# Patient Record
Sex: Male | Born: 1960 | Race: White | Hispanic: No | Marital: Married | Smoking: Never smoker
Health system: Southern US, Community
[De-identification: ages and names within clinical notes are randomized; demographics above are authoritative.]

## PROBLEM LIST (undated history)

## (undated) DIAGNOSIS — I509 Heart failure, unspecified: Secondary | ICD-10-CM

## (undated) DIAGNOSIS — I5189 Other ill-defined heart diseases: Secondary | ICD-10-CM

## (undated) DIAGNOSIS — M1990 Unspecified osteoarthritis, unspecified site: Secondary | ICD-10-CM

## (undated) DIAGNOSIS — I10 Essential (primary) hypertension: Secondary | ICD-10-CM

## (undated) DIAGNOSIS — I428 Other cardiomyopathies: Secondary | ICD-10-CM

## (undated) DIAGNOSIS — I639 Cerebral infarction, unspecified: Secondary | ICD-10-CM

## (undated) HISTORY — DX: Cerebral infarction, unspecified: I63.9

---

## 2013-11-02 ENCOUNTER — Emergency Department (HOSPITAL_COMMUNITY): Payer: BC Managed Care – PPO

## 2013-11-02 ENCOUNTER — Inpatient Hospital Stay (HOSPITAL_COMMUNITY)
Admission: EM | Admit: 2013-11-02 | Discharge: 2013-11-06 | DRG: 287 | Disposition: A | Discharge status: Home / Self Care | Payer: BC Managed Care – PPO | Attending: Cardiology | Admitting: Cardiology

## 2013-11-02 ENCOUNTER — Encounter (HOSPITAL_COMMUNITY): Payer: Self-pay | Admitting: Emergency Medicine

## 2013-11-02 DIAGNOSIS — I43 Cardiomyopathy in diseases classified elsewhere: Secondary | ICD-10-CM | POA: Diagnosis present

## 2013-11-02 DIAGNOSIS — I5031 Acute diastolic (congestive) heart failure: Secondary | ICD-10-CM | POA: Diagnosis present

## 2013-11-02 DIAGNOSIS — I110 Hypertensive heart disease with heart failure: Principal | ICD-10-CM | POA: Diagnosis present

## 2013-11-02 DIAGNOSIS — I160 Hypertensive urgency: Secondary | ICD-10-CM

## 2013-11-02 DIAGNOSIS — I420 Dilated cardiomyopathy: Secondary | ICD-10-CM

## 2013-11-02 DIAGNOSIS — I2510 Atherosclerotic heart disease of native coronary artery without angina pectoris: Secondary | ICD-10-CM | POA: Diagnosis present

## 2013-11-02 DIAGNOSIS — I472 Ventricular tachycardia, unspecified: Secondary | ICD-10-CM | POA: Diagnosis not present

## 2013-11-02 DIAGNOSIS — I2789 Other specified pulmonary heart diseases: Secondary | ICD-10-CM | POA: Diagnosis present

## 2013-11-02 DIAGNOSIS — Z8249 Family history of ischemic heart disease and other diseases of the circulatory system: Secondary | ICD-10-CM

## 2013-11-02 DIAGNOSIS — Z79899 Other long term (current) drug therapy: Secondary | ICD-10-CM

## 2013-11-02 DIAGNOSIS — I5021 Acute systolic (congestive) heart failure: Secondary | ICD-10-CM

## 2013-11-02 DIAGNOSIS — I509 Heart failure, unspecified: Secondary | ICD-10-CM | POA: Diagnosis present

## 2013-11-02 DIAGNOSIS — I4729 Other ventricular tachycardia: Secondary | ICD-10-CM | POA: Diagnosis not present

## 2013-11-02 DIAGNOSIS — I5043 Acute on chronic combined systolic (congestive) and diastolic (congestive) heart failure: Secondary | ICD-10-CM | POA: Diagnosis present

## 2013-11-02 DIAGNOSIS — N289 Disorder of kidney and ureter, unspecified: Secondary | ICD-10-CM | POA: Diagnosis present

## 2013-11-02 DIAGNOSIS — I10 Essential (primary) hypertension: Secondary | ICD-10-CM | POA: Diagnosis present

## 2013-11-02 HISTORY — DX: Other ill-defined heart diseases: I51.89

## 2013-11-02 HISTORY — DX: Essential (primary) hypertension: I10

## 2013-11-02 LAB — CBC
HEMATOCRIT: 45.3 % (ref 39.0–52.0)
Hemoglobin: 16 g/dL (ref 13.0–17.0)
MCH: 32.2 pg (ref 26.0–34.0)
MCHC: 35.3 g/dL (ref 30.0–36.0)
MCV: 91.1 fL (ref 78.0–100.0)
PLATELETS: 194 10*3/uL (ref 150–400)
RBC: 4.97 MIL/uL (ref 4.22–5.81)
RDW: 13.4 % (ref 11.5–15.5)
WBC: 10.3 10*3/uL (ref 4.0–10.5)

## 2013-11-02 LAB — BASIC METABOLIC PANEL
BUN: 31 mg/dL — AB (ref 6–23)
CO2: 22 mEq/L (ref 19–32)
CREATININE: 1.2 mg/dL (ref 0.50–1.35)
Calcium: 9.8 mg/dL (ref 8.4–10.5)
Chloride: 98 mEq/L (ref 96–112)
GFR calc non Af Amer: 67 mL/min — ABNORMAL LOW (ref >90)
GFR, EST AFRICAN AMERICAN: 78 mL/min — AB (ref >90)
Glucose, Bld: 139 mg/dL — ABNORMAL HIGH (ref 70–99)
Potassium: 4 mEq/L (ref 3.7–5.3)
Sodium: 140 mEq/L (ref 137–147)

## 2013-11-02 LAB — I-STAT TROPONIN, ED: Troponin i, poc: 0.05 ng/mL (ref 0.00–0.08)

## 2013-11-02 LAB — TROPONIN I: Troponin I: <0.3 ng/mL (ref <0.30)

## 2013-11-02 LAB — PRO B NATRIURETIC PEPTIDE: PRO B NATRI PEPTIDE: 9042 pg/mL — AB (ref 0–125)

## 2013-11-02 MED ORDER — SODIUM CHLORIDE 0.9 % IV SOLN: 250.0000 mL | INTRAVENOUS | Status: DC | PRN

## 2013-11-02 MED ORDER — SODIUM CHLORIDE 0.9 % IJ SOLN
3.0000 mL | INTRAMUSCULAR | Status: DC | PRN
  Administered 2013-11-06: 3 mL via INTRAVENOUS

## 2013-11-02 MED ORDER — FUROSEMIDE 10 MG/ML IJ SOLN
20.0000 mg | Freq: Once | INTRAMUSCULAR | Status: AC
  Administered 2013-11-02: 20 mg via INTRAVENOUS
  Filled 2013-11-02: qty 2

## 2013-11-02 MED ORDER — SODIUM CHLORIDE 0.9 % IJ SOLN
3.0000 mL | Freq: Two times a day (BID) | INTRAMUSCULAR | Status: DC
  Administered 2013-11-05 – 2013-11-06 (×3): 3 mL via INTRAVENOUS

## 2013-11-02 MED ORDER — FUROSEMIDE 10 MG/ML IJ SOLN
40.0000 mg | Freq: Two times a day (BID) | INTRAMUSCULAR | Status: DC
  Administered 2013-11-03 – 2013-11-04 (×4): 40 mg via INTRAVENOUS
  Filled 2013-11-02 (×8): qty 4

## 2013-11-02 MED ORDER — LABETALOL HCL 5 MG/ML IV SOLN
5.0000 mg | Freq: Once | INTRAVENOUS | Status: AC
  Administered 2013-11-02: 5 mg via INTRAVENOUS
  Filled 2013-11-02: qty 4

## 2013-11-02 MED ORDER — METOPROLOL TARTRATE 25 MG PO TABS
25.0000 mg | ORAL_TABLET | Freq: Two times a day (BID) | ORAL | Status: DC
  Administered 2013-11-02: 25 mg via ORAL
  Filled 2013-11-02 (×3): qty 1

## 2013-11-02 MED ORDER — NITROGLYCERIN IN D5W 200-5 MCG/ML-% IV SOLN
2.0000 ug/min | INTRAVENOUS | Status: AC
  Administered 2013-11-02: 5 ug/min via INTRAVENOUS
  Administered 2013-11-03: 80 ug/min via INTRAVENOUS
  Administered 2013-11-03: 85 ug/min via INTRAVENOUS
  Filled 2013-11-02 (×5): qty 250

## 2013-11-02 MED ORDER — HEPARIN SODIUM (PORCINE) 5000 UNIT/ML IJ SOLN
5000.0000 [IU] | Freq: Three times a day (TID) | INTRAMUSCULAR | Status: DC
  Administered 2013-11-02 – 2013-11-05 (×8): 5000 [IU] via SUBCUTANEOUS
  Filled 2013-11-02 (×11): qty 1

## 2013-11-02 NOTE — ED Notes (Signed)
Cardiology at bedside.

## 2013-11-02 NOTE — ED Notes (Signed)
Sent from dr's office [place] for work up for CHF. Legs swollen for three weeks 2+ pitting edema to knees, states having difficulty breathing for past couple nights, no hx of hypertension. Dry cough.

## 2013-11-02 NOTE — ED Notes (Signed)
Pt still in Xray, not brought back to room at this time.

## 2013-11-02 NOTE — ED Provider Notes (Signed)
CSN: [id number]     Arrival date & time 11/02/13  1618 History   First MD Initiated Contact with Patient 11/02/13 1837     Chief Complaint  Patient presents with  . Hypertension  . Leg Swelling  . Shortness of Breath     (Consider location/radiation/quality/duration/timing/severity/associated sxs/prior Treatment) Patient is a 52 y.o. male presenting with shortness of breath.  Shortness of Breath Severity:  Moderate Onset quality:  Gradual Duration:  3 weeks Timing:  Constant Progression:  Worsening Chronicity:  New Context: not activity and not URI   Relieved by:  None tried Worsened by:  Nothing tried Associated symptoms: no abdominal pain, no chest pain, no cough, no fever, no headaches, no neck pain and no vomiting   Risk factors: alcohol use     History reviewed. No pertinent past medical history. History reviewed. No pertinent past surgical history. No family history on file. History  Substance Use Topics  . Smoking status: Never Smoker   . Smokeless tobacco: Not on file  . Alcohol Use: 5.4 oz/week    9 Cans of beer per week     Comment: 9 beers at least a night,     Review of Systems  Constitutional: Negative for fever and chills.  HENT: Negative for congestion and rhinorrhea.   Eyes: Negative for pain.  Respiratory: Positive for shortness of breath. Negative for cough.   Cardiovascular: Positive for leg swelling. Negative for chest pain and palpitations.  Gastrointestinal: Negative for vomiting, abdominal pain, diarrhea and constipation.  Endocrine: Negative for polydipsia and polyuria.  Genitourinary: Negative for dysuria and flank pain.  Musculoskeletal: Negative for back pain and neck pain.  Skin: Negative for color change and wound.  Neurological: Negative for dizziness, numbness and headaches.      Allergies  Review of patient's allergies indicates not on file.  Home Medications   Prior to Admission medications   Not on File   BP 229/153   Pulse 112  Temp(Src) 98.1 F (36.7 C) (Oral)  Resp 26  SpO2 95% Physical Exam  Nursing note and vitals reviewed. Constitutional: He is oriented to person, place, and time. He appears well-developed and well-nourished.  HENT:  Head: Normocephalic and atraumatic.  Eyes: Conjunctivae and EOM are normal. Pupils are equal, round, and reactive to light.  Neck: Normal range of motion. JVD present.  Cardiovascular: Regular rhythm.  Tachycardia present.   Pulmonary/Chest: Effort normal and breath sounds normal.  Abdominal: Soft. He exhibits no distension. There is no tenderness.  Musculoskeletal: Normal range of motion. He exhibits edema (2+ to the knees). He exhibits no tenderness.  Neurological: He is alert and oriented to person, place, and time.  Skin: Skin is warm and dry.    ED Course  Procedures (including critical care time) Labs Review Labs Reviewed  BASIC METABOLIC PANEL - Abnormal; Notable for the following:    Glucose, Bld 139 (*)    BUN 31 (*)    GFR calc non Af Amer 67 (*)    GFR calc Af Amer 78 (*)    All other components within normal limits  PRO B NATRIURETIC PEPTIDE - Abnormal; Notable for the following:    Pro B Natriuretic peptide (BNP) 9042.0 (*)    All other components within normal limits  CBC  I-STAT TROPOININ, ED    Imaging Review Dg Chest 2 View  11/02/2013   CLINICAL DATA:  HYPERTENSION LEG SWELLING SHORTNESS OF BREATH  EXAM: CHEST  2 VIEW  COMPARISON:  None.  FINDINGS: Moderate cardiomegaly is present. Mediastinal silhouette within normal limits.  Lungs are normally inflated. A small right pleural effusion is present. Associated right basilar opacities most likely reflect atelectasis. The left lung is clear. No pulmonary edema. No pneumothorax.  No acute osseous abnormality.  IMPRESSION: 1. Cardiomegaly without pulmonary edema. 2. Small right pleural effusion with associated right basilar atelectasis.   Electronically Signed   By: Jeannine Boga M.D.    On: 11/02/2013 18:28     EKG Interpretation   Date/Time:  Monday November 02 2013 17:25:11 EDT Ventricular Rate:  111 PR Interval:  152 QRS Duration: 100 QT Interval:  340 QTC Calculation: 462 R Axis:   -24 Text Interpretation:  Sinus tachycardia Possible Left atrial enlargement  Incomplete right bundle branch block Left ventricular hypertrophy  Nonspecific T wave abnormality Abnormal ECG Sinus tachycardia Left  ventricular hypertrophy Non-specific intra-ventricular conduction delay T  wave abnormality Abnormal ekg Confirmed by Carmin Muskrat  MD [phone]) on  11/02/2013 6:54:02 PM      MDM   Final diagnoses:  None    53 yo M who doesn't go to the doctor but likely long standing HTN here with htn urgency. Obvious s/s heart failure with JVD, LE edema, sob and orthopnea. Labs with elevated bnp and troponin, ecg with LVH, cxr small pleural effusions. Given labetalol and iv lasix,  with some response in BP, didn't want to drop >25% with concern for possible cerebral hypoperfusion. cardiology consulted for admission.     Merrily Pew, MD 11/03/13 2108

## 2013-11-02 NOTE — H&P (Signed)
Admit date: 11/02/2013 Referring Physician:  Dr. Dayna Barker Primary Cardiologist:  NONE Chief complaint/reason for admission:sob, LE edema3  HPI: This is a 53yo WM with a history of HTN (on no meds) who presented to the ER with complaints of SOB and LE edema for about 3 weeks.  The edema has occurred for 3 weeks and SOB for about 5 days.  The SOB occurred with exertion and with lying flat in bed.  He has had orthopnea and last night he had to sit propped up to sleep.  He says that he has not been able to get comfortable in bed due to SOB.  He denies any PND.  He denies any chest pain or pressure.  He denies any diaphoresis, nausea or vomiting.  He does not use table salt, although his wife occasionally cooks with sea salt.  In the ER his BP was 204/164mmHg.  He says that he has not seen a PCP since 2012.      PMH:    Past Medical History  Diagnosis Date  . Hypertension     PSH:   History reviewed. No pertinent past surgical history.  ALLERGIES:   Review of patient's allergies indicates no known allergies.  Prior to Admit Meds:   (Not in a hospital admission) Family HX:    Family History  Problem Relation Age of Onset  . Heart attack Father   . Heart disease Father   . Arrhythmia Father   . Hypertension Brother   . Hypertension Brother    Social HX:    History   Social History  . Marital Status: Single    Spouse Name: N/A    Number of Children: N/A  . Years of Education: N/A   Occupational History  . Not on file.   Social History Main Topics  . Smoking status: Never Smoker   . Smokeless tobacco: Not on file  . Alcohol Use: 5.4 oz/week    9 Cans of beer per week     Comment: 9 beers at least a night,   . Drug Use: No  . Sexual Activity: Not on file   Other Topics Concern  . Not on file   Social History Narrative  . No narrative on file     ROS:  All 11 ROS were addressed and are negative except what is stated in the HPI  PHYSICAL EXAM Filed Vitals:   11/02/13  1848  BP: 229/153  Pulse:   Temp:   Resp: 26   General: Well developed, well nourished, in no acute distress Head: Eyes PERRLA, No xanthomas.   Normal cephalic and atramatic  Lungs:   Crackles at bases bilaterally Heart:   HRRR S1 S2 Pulses are 2+ & equal.            No carotid bruit. No JVD.  No abdominal bruits. No femoral bruits. Abdomen: Bowel sounds are positive, abdomen soft and non-tender without masses  Extremities:   3+ edema bilaterally up to his knees bilaterally Neuro: Alert and oriented X 3. Psych:  Good affect, responds appropriately   Labs:   Lab Results  Component Value Date   WBC 10.3 11/02/2013   HGB 16.0 11/02/2013   HCT 45.3 11/02/2013   MCV 91.1 11/02/2013   PLT 194 11/02/2013    Recent Labs Lab 11/02/13 1740  NA 140  K 4.0  CL 98  CO2 22  BUN 31*  CREATININE 1.20  CALCIUM 9.8  GLUCOSE 139*  Radiology:  No results found.  EKG:  NSR with LAE and LVH with repolarization abnormality  ASSESSMENT:  1.  Hypertensive urgency 2.  Acute CHF most likely diastolic CHF related to #1 but could have devloped a hypertensive DCM with acute systolic CHF if BP has been elevated for some time.  He has evidence of LVH on EKG.   PLAN:   1.  Admit to stepdown bed - tele 2.  IV NTG gtt and titrated to reduce SBP<141mmHg 3.  IV Lasix 40mg  IV BID and follow renal function closely 4.  Lopressor 25mg  BID and titrated as needed for BP control 5.  Cycle cardiac enzymes 6.  2D echo in am to assess LVF   Sueanne Margarita, MD  11/02/2013  7:40 PM

## 2013-11-03 DIAGNOSIS — I369 Nonrheumatic tricuspid valve disorder, unspecified: Secondary | ICD-10-CM

## 2013-11-03 LAB — HEMOGLOBIN A1C
Hgb A1c MFr Bld: 6 % — ABNORMAL HIGH (ref <5.7)
Mean Plasma Glucose: 126 mg/dL — ABNORMAL HIGH (ref <117)

## 2013-11-03 LAB — BASIC METABOLIC PANEL
BUN: 31 mg/dL — AB (ref 6–23)
CO2: 28 mEq/L (ref 19–32)
CREATININE: 1.31 mg/dL (ref 0.50–1.35)
Calcium: 9.4 mg/dL (ref 8.4–10.5)
Chloride: 100 mEq/L (ref 96–112)
GFR calc Af Amer: 70 mL/min — ABNORMAL LOW (ref >90)
GFR, EST NON AFRICAN AMERICAN: 61 mL/min — AB (ref >90)
Glucose, Bld: 169 mg/dL — ABNORMAL HIGH (ref 70–99)
Potassium: 3.7 mEq/L (ref 3.7–5.3)
Sodium: 143 mEq/L (ref 137–147)

## 2013-11-03 LAB — TROPONIN I
Troponin I: <0.3 ng/mL (ref <0.30)
Troponin I: <0.3 ng/mL (ref <0.30)
Troponin I: <0.3 ng/mL (ref <0.30)

## 2013-11-03 LAB — TSH: TSH: 5.01 u[IU]/mL — ABNORMAL HIGH (ref 0.350–4.500)

## 2013-11-03 LAB — MRSA PCR SCREENING: MRSA by PCR: NEGATIVE

## 2013-11-03 MED ORDER — METOPROLOL TARTRATE 50 MG PO TABS
50.0000 mg | ORAL_TABLET | Freq: Two times a day (BID) | ORAL | Status: DC
  Administered 2013-11-03 (×2): 50 mg via ORAL
  Filled 2013-11-03 (×4): qty 1

## 2013-11-03 MED ORDER — ACETAMINOPHEN 325 MG PO TABS
650.0000 mg | ORAL_TABLET | ORAL | Status: DC | PRN
  Administered 2013-11-03 – 2013-11-04 (×4): 650 mg via ORAL
  Filled 2013-11-03 (×4): qty 2

## 2013-11-03 MED ORDER — LISINOPRIL 10 MG PO TABS
10.0000 mg | ORAL_TABLET | Freq: Every day | ORAL | Status: DC
  Administered 2013-11-03: 10 mg via ORAL
  Filled 2013-11-03 (×2): qty 1

## 2013-11-03 NOTE — Progress Notes (Signed)
Echocardiogram 2D Echocardiogram has been performed.  Jeremy Grant 11/03/2013, 10:08 AM

## 2013-11-03 NOTE — Progress Notes (Signed)
     SUBJECTIVE: No chest pain. SOB is improved.   BP 179/118  Pulse 77  Temp(Src) 97.4 F (36.3 C) (Oral)  Resp 16  Ht 6\' 1"  (1.854 m)  Wt 161 lb 14.4 oz (73.437 kg)  BMI 21.36 kg/m2  SpO2 94%  Intake/Output Summary (Last 24 hours) at 11/03/13 [id number] Last data filed at 11/03/13 0600  Gross per 24 hour  Intake  98.43 ml  Output   2070 ml  Net -1971.57 ml    PHYSICAL EXAM General: Well developed, well nourished, in no acute distress. Alert and oriented x 3.  Psych:  Good affect, responds appropriately Neck: No JVD. No masses noted.  Lungs: Clear bilaterally with no wheezes or rhonci noted.  Heart: RRR with no murmurs noted. Abdomen: Bowel sounds are present. Soft, non-tender.  Extremities: 2+ bilateral lower extremity edema.   LABS: Basic Metabolic Panel:  Recent Labs  11/02/13 1740 11/03/13 0332  NA 140 143  K 4.0 3.7  CL 98 100  CO2 22 28  GLUCOSE 139* 169*  BUN 31* 31*  CREATININE 1.20 1.31  CALCIUM 9.8 9.4   CBC:  Recent Labs  11/02/13 1740  WBC 10.3  HGB 16.0  HCT 45.3  MCV 91.1  PLT 194   Cardiac Enzymes:  Recent Labs  11/02/13 1916 11/02/13 2348 11/03/13 0332  TROPONINI <0.30 <0.30 <0.30    Current Meds: . furosemide  40 mg Intravenous BID  . heparin  5,000 Units Subcutaneous 3 times per day  . metoprolol tartrate  25 mg Oral BID  . sodium chloride  3 mL Intravenous Q12H    ASSESSMENT AND PLAN:  1. Hypertensive urgency: He is on a NTG drip and low dose beta blocker. BP still not well controlled. Will continue IV NTG today. Will increase metoprolol to 50 mg po BID and will add Lisinopril 10 mg daily.   2. Acute CHF: Likely diastolic given HTN. EKG with LVH changes. Echo pending today. He has diuresed 2 liters with IV Lasix. Will continue IV Lasix today and work on better BP control.    MCALHANY,CHRISTOPHER  6/23/20158:37 AM

## 2013-11-03 NOTE — Care Management Note (Signed)
    Page 1 of 1   11/03/2013     7:49:33 AM CARE MANAGEMENT NOTE 11/03/2013  Patient:  Jeremy Grant, Jeremy Grant   Account Number:  [id number]  Date Initiated:  11/03/2013  Documentation initiated by:  Elissa Hefty  Subjective/Objective Assessment:   adm w htn urgency     Action/Plan:   lives w wife, pcp dr Redmond Pulling elkins   Anticipated DC Date:     Anticipated DC Plan:           Choice offered to / List presented to:             Status of service:   Medicare Important Message given?   (If response is "NO", the following Medicare IM given date fields will be blank) Date Medicare IM given:   Date Additional Medicare IM given:    Discharge Disposition:    Per UR Regulation:  Reviewed for med. necessity/level of care/duration of stay  If discussed at [place] of Stay Meetings, dates discussed:    Comments:

## 2013-11-04 DIAGNOSIS — I428 Other cardiomyopathies: Secondary | ICD-10-CM

## 2013-11-04 DIAGNOSIS — I420 Dilated cardiomyopathy: Secondary | ICD-10-CM

## 2013-11-04 DIAGNOSIS — I509 Heart failure, unspecified: Secondary | ICD-10-CM

## 2013-11-04 DIAGNOSIS — I5021 Acute systolic (congestive) heart failure: Secondary | ICD-10-CM

## 2013-11-04 LAB — BASIC METABOLIC PANEL
BUN: 23 mg/dL (ref 6–23)
CHLORIDE: 99 meq/L (ref 96–112)
CO2: 30 mEq/L (ref 19–32)
Calcium: 8.7 mg/dL (ref 8.4–10.5)
Creatinine, Ser: 1.31 mg/dL (ref 0.50–1.35)
GFR calc Af Amer: 70 mL/min — ABNORMAL LOW (ref >90)
GFR calc non Af Amer: 61 mL/min — ABNORMAL LOW (ref >90)
GLUCOSE: 116 mg/dL — AB (ref 70–99)
POTASSIUM: 3.4 meq/L — AB (ref 3.7–5.3)
Sodium: 143 mEq/L (ref 137–147)

## 2013-11-04 MED ORDER — ASPIRIN 81 MG PO CHEW
81.0000 mg | CHEWABLE_TABLET | ORAL | Status: AC
  Administered 2013-11-05: 81 mg via ORAL
  Filled 2013-11-04: qty 1

## 2013-11-04 MED ORDER — POTASSIUM CHLORIDE CRYS ER 20 MEQ PO TBCR
40.0000 meq | EXTENDED_RELEASE_TABLET | Freq: Once | ORAL | Status: AC
  Administered 2013-11-04: 40 meq via ORAL
  Filled 2013-11-04: qty 2

## 2013-11-04 MED ORDER — ISOSORBIDE MONONITRATE ER 30 MG PO TB24
30.0000 mg | ORAL_TABLET | Freq: Two times a day (BID) | ORAL | Status: DC
  Administered 2013-11-04 – 2013-11-06 (×5): 30 mg via ORAL
  Filled 2013-11-04 (×6): qty 1

## 2013-11-04 MED ORDER — SODIUM CHLORIDE 0.9 % IV SOLN: 250.0000 mL | INTRAVENOUS | Status: DC | PRN

## 2013-11-04 MED ORDER — LISINOPRIL 20 MG PO TABS
20.0000 mg | ORAL_TABLET | Freq: Every day | ORAL | Status: DC
  Administered 2013-11-04 – 2013-11-06 (×3): 20 mg via ORAL
  Filled 2013-11-04 (×3): qty 1

## 2013-11-04 MED ORDER — SODIUM CHLORIDE 0.9 % IV SOLN
INTRAVENOUS | Status: DC
  Administered 2013-11-05: 05:00:00 via INTRAVENOUS

## 2013-11-04 MED ORDER — CARVEDILOL 12.5 MG PO TABS
12.5000 mg | ORAL_TABLET | Freq: Two times a day (BID) | ORAL | Status: DC
  Administered 2013-11-04 – 2013-11-05 (×3): 12.5 mg via ORAL
  Filled 2013-11-04 (×5): qty 1

## 2013-11-04 MED ORDER — SODIUM CHLORIDE 0.9 % IJ SOLN: 3.0000 mL | INTRAMUSCULAR | Status: DC | PRN

## 2013-11-04 MED ORDER — ISOSORBIDE MONONITRATE ER 30 MG PO TB24: 30.0000 mg | ORAL_TABLET | Freq: Every day | ORAL | Status: DC

## 2013-11-04 MED ORDER — ISOSORBIDE MONONITRATE 15 MG HALF TABLET
15.0000 mg | ORAL_TABLET | Freq: Every day | ORAL | Status: DC
  Filled 2013-11-04: qty 1

## 2013-11-04 MED ORDER — SODIUM CHLORIDE 0.9 % IJ SOLN
3.0000 mL | Freq: Two times a day (BID) | INTRAMUSCULAR | Status: DC
  Administered 2013-11-04 – 2013-11-05 (×3): 3 mL via INTRAVENOUS

## 2013-11-04 NOTE — Progress Notes (Addendum)
Spoke at length with patient and patient's wife about importance of taking medication and follow up appointments, as well as education about heart failure, symptom management, diet and lifestyle modifications.  Patient very receptive, wife distrustful of medical system.  Will continue attempts to encourage and educate patient. [place], Ardeth Sportsman, RN

## 2013-11-04 NOTE — Progress Notes (Addendum)
     SUBJECTIVE: Feeling much better. Breathing improved. No chest pain.   BP 180/114  Pulse 79  Temp(Src) 98.3 F (36.8 C) (Oral)  Resp 25  Ht 6\' 1"  (1.854 m)  Wt 153 lb 7 oz (69.6 kg)  BMI 20.25 kg/m2  SpO2 92%  Intake/Output Summary (Last 24 hours) at 11/04/13 0931 Last data filed at 11/04/13 0854  Gross per 24 hour  Intake 1485.08 ml  Output   5850 ml  Net -4364.92 ml    PHYSICAL EXAM General: Well developed, well nourished, in no acute distress. Alert and oriented x 3.  Psych:  Good affect, responds appropriately Neck: + JVD. No masses noted.  Lungs: Clear bilaterally minimal basilar crackles.   Heart: RRR with no murmurs noted. Abdomen: Bowel sounds are present. Soft, non-tender.  Extremities: 1+ bilateral lower extremity edema.   LABS: Basic Metabolic Panel:  Recent Labs  11/03/13 0332 11/04/13 0245  NA 143 143  K 3.7 3.4*  CL 100 99  CO2 28 30  GLUCOSE 169* 116*  BUN 31* 23  CREATININE 1.31 1.31  CALCIUM 9.4 8.7   CBC:  Recent Labs  11/02/13 1740  WBC 10.3  HGB 16.0  HCT 45.3  MCV 91.1  PLT 194   Cardiac Enzymes:  Recent Labs  11/02/13 2348 11/03/13 0332 11/03/13 1125  TROPONINI <0.30 <0.30 <0.30    Current Meds: . furosemide  40 mg Intravenous BID  . heparin  5,000 Units Subcutaneous 3 times per day  . lisinopril  10 mg Oral Daily  . metoprolol tartrate  50 mg Oral BID  . sodium chloride  3 mL Intravenous Q12H   ASSESSMENT AND PLAN:  1. Hypertensive urgency: BP minimally improved overnight. Will titrate meds. Will increase Lisinopril to 20 mg daily.  With new systolic dysfunction noted on echo, will change to Coreg 12.5 mg po BID. Will stop NTG drip and add Imdur 30 mg daily.    2. Cardiomyopathy: Likely due to longstanding HTN. LVEF=15% by echo. I have had a long discussion with the patient and his wife in regards to the likely etiology of his cardiomyopathy. Will arrange right and left heart cath tomorrow to exclude CAD and  assess pressures. Will continue medical therapy with beta blocker, ace-inh, nitrate. His wife has many beliefs in the dangers of modern medicine and questions the need for medications long term. I have stated the importance of compliance with medical therapy. His long term follow up will be with Dr. Fransico Him.   3. Acute systolic CHF: Volume status improving. Still with mild volume overload. Will continue IV Lasix today. Likely switch to po Lasix tomorrow.   Transfer to telemetry unit. Cath tomorrow.      MCALHANY,CHRISTOPHER  6/24/20159:31 AM

## 2013-11-04 NOTE — Progress Notes (Deleted)
Md informed about  Persistent hypotension (87/49) with a map of 54 and Hr of 90. Pt is chest pain free and alert and oriented. Will keep monitoring.

## 2013-11-04 NOTE — Clinical Documentation Improvement (Signed)
Progress notes with document HTN urgency.  Please clarify if possible clinical conditions.   Accelerated Hypertension Malignant Hypertension Or Other Condition__________ Cannot Clinically Determine   Supporting Information: Risk Factors: Long standing HTN,  Alcohol use, heart failure, No medical folloq up since 2012  Signs and Symptoms: SOB, 3+ LE edema, JVD, Tachycardia, Labs with elevated bnp and troponin, ecg with LVH, cxr small pleural effusions  BP range:  Per ED note: BP 229/153  Pulse 112                                           204/180mmHg                On 11/03/13  BP 179/118  Diagnostics: [id number] Interpretation: Sinus tachycardia Possible Left atrial enlargement  Incomplete right bundle branch block Left ventricular hypertrophy  Nonspecific T wave abnormality Abnormal ECG Sinus tachycardia Left  ventricular hypertrophy Non-specific intra-ventricular conduction delay T  wave abnormality Abnormal ekg   Treatment: In ED>labetalol and IV lasix Admit to stepdown IV NTG gtt and titrated to reduce SBP<14mmHg   IV Lasix 40mg  IV BID and follow renal function closely   Lopressor 25mg  BID and titrated as needed for BP control   Cycle cardiac enzymes 11/03/13  PLAN:Will increase metoprolol to 50 mg po BID and will add Lisinopril 10 mg daily.     Thank You, Philippa Chester ,RN Clinical Documentation Specialist:  [place] [employer]

## 2013-11-05 ENCOUNTER — Encounter (HOSPITAL_COMMUNITY)
Admission: EM | Disposition: A | Discharge status: Home / Self Care | Payer: Self-pay | Source: Home / Self Care | Attending: Cardiology

## 2013-11-05 DIAGNOSIS — I472 Ventricular tachycardia: Secondary | ICD-10-CM

## 2013-11-05 DIAGNOSIS — N189 Chronic kidney disease, unspecified: Secondary | ICD-10-CM

## 2013-11-05 DIAGNOSIS — I2510 Atherosclerotic heart disease of native coronary artery without angina pectoris: Secondary | ICD-10-CM

## 2013-11-05 DIAGNOSIS — I509 Heart failure, unspecified: Secondary | ICD-10-CM

## 2013-11-05 DIAGNOSIS — I4729 Other ventricular tachycardia: Secondary | ICD-10-CM

## 2013-11-05 HISTORY — PX: LEFT AND RIGHT HEART CATHETERIZATION WITH CORONARY ANGIOGRAM: SHX5449

## 2013-11-05 LAB — BASIC METABOLIC PANEL
BUN: 30 mg/dL — AB (ref 6–23)
CO2: 33 mEq/L — ABNORMAL HIGH (ref 19–32)
Calcium: 8.9 mg/dL (ref 8.4–10.5)
Chloride: 98 mEq/L (ref 96–112)
Creatinine, Ser: 1.46 mg/dL — ABNORMAL HIGH (ref 0.50–1.35)
GFR calc Af Amer: 62 mL/min — ABNORMAL LOW (ref >90)
GFR, EST NON AFRICAN AMERICAN: 53 mL/min — AB (ref >90)
GLUCOSE: 116 mg/dL — AB (ref 70–99)
Potassium: 3.9 mEq/L (ref 3.7–5.3)
Sodium: 142 mEq/L (ref 137–147)

## 2013-11-05 LAB — CBC
HCT: 41.7 % (ref 39.0–52.0)
HEMATOCRIT: 40.7 % (ref 39.0–52.0)
Hemoglobin: 13.7 g/dL (ref 13.0–17.0)
Hemoglobin: 14.3 g/dL (ref 13.0–17.0)
MCH: 30.9 pg (ref 26.0–34.0)
MCH: 31.4 pg (ref 26.0–34.0)
MCHC: 33.7 g/dL (ref 30.0–36.0)
MCHC: 34.3 g/dL (ref 30.0–36.0)
MCV: 91.7 fL (ref 78.0–100.0)
MCV: 91.6 fL (ref 78.0–100.0)
Platelets: 152 10*3/uL (ref 150–400)
Platelets: 147 10*3/uL — ABNORMAL LOW (ref 150–400)
RBC: 4.44 MIL/uL (ref 4.22–5.81)
RBC: 4.55 MIL/uL (ref 4.22–5.81)
RDW: 13.1 % (ref 11.5–15.5)
RDW: 13 % (ref 11.5–15.5)
WBC: 6 10*3/uL (ref 4.0–10.5)
WBC: 5.6 10*3/uL (ref 4.0–10.5)

## 2013-11-05 LAB — POCT I-STAT 3, ART BLOOD GAS (G3+)
ACID-BASE EXCESS: 6 mmol/L — AB (ref 0.0–2.0)
Acid-Base Excess: 5 mmol/L — ABNORMAL HIGH (ref 0.0–2.0)
Bicarbonate: 30.1 mEq/L — ABNORMAL HIGH (ref 20.0–24.0)
Bicarbonate: 30.5 mEq/L — ABNORMAL HIGH (ref 20.0–24.0)
O2 SAT: 67 %
O2 Saturation: 91 %
PH ART: 7.427 (ref 7.350–7.450)
TCO2: 32 mmol/L (ref 0–100)
TCO2: 31 mmol/L (ref 0–100)
pCO2 arterial: 42 mmHg (ref 35.0–45.0)
pCO2 arterial: 46.3 mmHg — ABNORMAL HIGH (ref 35.0–45.0)
pH, Arterial: 7.463 — ABNORMAL HIGH (ref 7.350–7.450)
pO2, Arterial: 34 mmHg — CL (ref 80.0–100.0)
pO2, Arterial: 58 mmHg — ABNORMAL LOW (ref 80.0–100.0)

## 2013-11-05 LAB — CREATININE, SERUM
Creatinine, Ser: 1.15 mg/dL (ref 0.50–1.35)
GFR calc Af Amer: 82 mL/min — ABNORMAL LOW (ref >90)
GFR, EST NON AFRICAN AMERICAN: 71 mL/min — AB (ref >90)

## 2013-11-05 LAB — PROTIME-INR
INR: 1.12 (ref 0.00–1.49)
PROTHROMBIN TIME: 14.4 s (ref 11.6–15.2)

## 2013-11-05 SURGERY — LEFT AND RIGHT HEART CATHETERIZATION WITH CORONARY ANGIOGRAM
Anesthesia: LOCAL

## 2013-11-05 MED ORDER — HEPARIN SODIUM (PORCINE) 5000 UNIT/ML IJ SOLN
5000.0000 [IU] | Freq: Three times a day (TID) | INTRAMUSCULAR | Status: DC
  Administered 2013-11-05 – 2013-11-06 (×2): 5000 [IU] via SUBCUTANEOUS
  Filled 2013-11-05 (×3): qty 1

## 2013-11-05 MED ORDER — FENTANYL CITRATE 0.05 MG/ML IJ SOLN
INTRAMUSCULAR | Status: AC
  Filled 2013-11-05: qty 2

## 2013-11-05 MED ORDER — MIDAZOLAM HCL 2 MG/2ML IJ SOLN
INTRAMUSCULAR | Status: AC
  Filled 2013-11-05: qty 2

## 2013-11-05 MED ORDER — HEPARIN SODIUM (PORCINE) 1000 UNIT/ML IJ SOLN
INTRAMUSCULAR | Status: AC
  Filled 2013-11-05: qty 1

## 2013-11-05 MED ORDER — SODIUM CHLORIDE 0.9 % IV SOLN: 1.0000 mL/kg/h | INTRAVENOUS | Status: AC

## 2013-11-05 MED ORDER — HEPARIN (PORCINE) IN NACL 2-0.9 UNIT/ML-% IJ SOLN
INTRAMUSCULAR | Status: AC
  Filled 2013-11-05: qty 1000

## 2013-11-05 MED ORDER — VERAPAMIL HCL 2.5 MG/ML IV SOLN
INTRAVENOUS | Status: AC
Start: 2013-11-05 — End: 2013-11-05
  Filled 2013-11-05: qty 2

## 2013-11-05 MED ORDER — ONDANSETRON HCL 4 MG/2ML IJ SOLN: 4.0000 mg | Freq: Four times a day (QID) | INTRAMUSCULAR | Status: DC | PRN

## 2013-11-05 MED ORDER — HYDRALAZINE HCL 25 MG PO TABS
25.0000 mg | ORAL_TABLET | Freq: Three times a day (TID) | ORAL | Status: DC
  Administered 2013-11-05 – 2013-11-06 (×4): 25 mg via ORAL
  Filled 2013-11-05 (×6): qty 1

## 2013-11-05 MED ORDER — CARVEDILOL 25 MG PO TABS
25.0000 mg | ORAL_TABLET | Freq: Two times a day (BID) | ORAL | Status: DC
  Administered 2013-11-05 – 2013-11-06 (×2): 25 mg via ORAL
  Filled 2013-11-05 (×4): qty 1

## 2013-11-05 MED ORDER — LIDOCAINE HCL (PF) 1 % IJ SOLN
INTRAMUSCULAR | Status: AC
  Filled 2013-11-05: qty 30

## 2013-11-05 MED ORDER — NITROGLYCERIN 0.2 MG/ML ON CALL CATH LAB
INTRAVENOUS | Status: AC
  Filled 2013-11-05: qty 1

## 2013-11-05 MED ORDER — FUROSEMIDE 40 MG PO TABS
40.0000 mg | ORAL_TABLET | Freq: Every day | ORAL | Status: DC
  Administered 2013-11-05 – 2013-11-06 (×2): 40 mg via ORAL
  Filled 2013-11-05 (×2): qty 1

## 2013-11-05 NOTE — H&P (View-Only) (Signed)
     SUBJECTIVE: Feeling much better. Breathing improved. No chest pain.   BP 180/114  Pulse 79  Temp(Src) 98.3 F (36.8 C) (Oral)  Resp 25  Ht 6\' 1"  (1.854 m)  Wt 153 lb 7 oz (69.6 kg)  BMI 20.25 kg/m2  SpO2 92%  Intake/Output Summary (Last 24 hours) at 11/04/13 0931 Last data filed at 11/04/13 0854  Gross per 24 hour  Intake 1485.08 ml  Output   5850 ml  Net -4364.92 ml    PHYSICAL EXAM General: Well developed, well nourished, in no acute distress. Alert and oriented x 3.  Psych:  Good affect, responds appropriately Neck: + JVD. No masses noted.  Lungs: Clear bilaterally minimal basilar crackles.   Heart: RRR with no murmurs noted. Abdomen: Bowel sounds are present. Soft, non-tender.  Extremities: 1+ bilateral lower extremity edema.   LABS: Basic Metabolic Panel:  Recent Labs  11/03/13 0332 11/04/13 0245  NA 143 143  K 3.7 3.4*  CL 100 99  CO2 28 30  GLUCOSE 169* 116*  BUN 31* 23  CREATININE 1.31 1.31  CALCIUM 9.4 8.7   CBC:  Recent Labs  11/02/13 1740  WBC 10.3  HGB 16.0  HCT 45.3  MCV 91.1  PLT 194   Cardiac Enzymes:  Recent Labs  11/02/13 2348 11/03/13 0332 11/03/13 1125  TROPONINI <0.30 <0.30 <0.30    Current Meds: . furosemide  40 mg Intravenous BID  . heparin  5,000 Units Subcutaneous 3 times per day  . lisinopril  10 mg Oral Daily  . metoprolol tartrate  50 mg Oral BID  . sodium chloride  3 mL Intravenous Q12H   ASSESSMENT AND PLAN:  1. Hypertensive urgency: BP minimally improved overnight. Will titrate meds. Will increase Lisinopril to 20 mg daily.  With new systolic dysfunction noted on echo, will change to Coreg 12.5 mg po BID. Will stop NTG drip and add Imdur 30 mg daily.    2. Cardiomyopathy: Likely due to longstanding HTN. LVEF=15% by echo. I have had a long discussion with the patient and his wife in regards to the likely etiology of his cardiomyopathy. Will arrange right and left heart cath tomorrow to exclude CAD and  assess pressures. Will continue medical therapy with beta blocker, ace-inh, nitrate. His wife has many beliefs in the dangers of modern medicine and questions the need for medications long term. I have stated the importance of compliance with medical therapy. His long term follow up will be with Dr. Fransico Him.   3. Acute systolic CHF: Volume status improving. Still with mild volume overload. Will continue IV Lasix today. Likely switch to po Lasix tomorrow.   Transfer to telemetry unit. Cath tomorrow.      MCALHANY,CHRISTOPHER  6/24/20159:31 AM

## 2013-11-05 NOTE — ED Provider Notes (Signed)
This patient was seen in conjunction with the resident physician, Dr. Dayna Barker.  The documentation accurately reflects the patient's ED evaluation.  On my exam, the patient was awake and alert. With concern for new HF, and his notable HTN, the patient required admission for further E/M.  I have seen the ECG, 111, st, lvh, abnormal   Jeremy Muskrat, MD 11/05/13 1120

## 2013-11-05 NOTE — CV Procedure (Signed)
    Cardiac Catheterization Procedure Note  Name: Jeremy Grant MRN: [id number] DOB: Aug 13, 1960  Procedure: Right Heart Cath, Left Heart Cath, Selective Coronary Angiography  Indication: 53 yo WM with new onset CHF with EF 20% by Echo.    Procedural Details: The right wrist was prepped, draped, and anesthetized with 1% lidocaine. Using the modified Seldinger technique a 6 Fr slender sheath was placed in the right radial artery and a 5 French sheath was placed in the right brachial vein. A Swan-Ganz catheter was used for the right heart catheterization. Standard protocol was followed for recording of right heart pressures and sampling of oxygen saturations. Fick cardiac output was calculated. Standard Judkins catheters were used for selective coronary angiography and left ventricular pressure measurement. There were no immediate procedural complications. The patient was transferred to the post catheterization recovery area for further monitoring.  Procedural Findings: Hemodynamics RA 8/5 mean 3 mm Hg RV 52/5 mm Hg PA 49/19 mean 31 mm Hg PCWP 13/11 mean 10 mm Hg LV 165/15 mm Hg AO 166/93 mean 125 mm Hg  Oxygen saturations: PA 67% AO 91%  Cardiac Output (Fick) 5.71 L/min  Cardiac Index (Fick) 2.97 L/min/meter squared   Coronary angiography: Coronary dominance: right  Left mainstem: Normal  Left anterior descending (LAD): Large vessel. The LAD is without significant disease. The first diagonal has 20% disease at the ostium.  Left circumflex (LCx): Normal.  Right coronary artery (RCA): 30% stenosis at the bifurcation of the PDA and PLOM.   Left ventriculography: not done  Final Conclusions:   1. Minimal nonobstructive CAD 2. 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 Normal LV filling pressures  Recommendations: Medical management.   Peter Martinique, [place] 11/05/2013, 11:03 AM

## 2013-11-05 NOTE — Progress Notes (Signed)
Patient Name: Jeremy Grant Date of Encounter: 11/05/2013     Active Problems:   Acute diastolic CHF (congestive heart failure), NYHA class 4   CHF (congestive heart failure)   Acute systolic CHF (congestive heart failure), NYHA class 4   Congestive dilated cardiomyopathy    SUBJECTIVE  Denies any chest pain or SOB. No dizziness overnight. Slept well.   CURRENT MEDS . carvedilol  12.5 mg Oral BID WC  . furosemide  40 mg Intravenous BID  . heparin  5,000 Units Subcutaneous 3 times per day  . isosorbide mononitrate  30 mg Oral Q12H  . lisinopril  20 mg Oral Daily  . sodium chloride  3 mL Intravenous Q12H  . sodium chloride  3 mL Intravenous Q12H    OBJECTIVE  Filed Vitals:   11/04/13 1915 11/04/13 2146 11/05/13 0158 11/05/13 0623  BP: 138/87 157/103 152/102 151/98  Pulse: 75 81 85 85  Temp: 97.7 F (36.5 C) 97.7 F (36.5 C) 98.5 F (36.9 C) 98.5 F (36.9 C)  TempSrc: Oral Oral Oral Oral  Resp: 20 18 18 18   Height:      Weight:    148 lb 12.8 oz (67.495 kg)  SpO2: 95% 97% 98% 98%    Intake/Output Summary (Last 24 hours) at 11/05/13 0812 Last data filed at 11/05/13 0737  Gross per 24 hour  Intake  787.8 ml  Output  13975 ml  Net -13187.2 ml   Filed Weights   11/04/13 0400 11/04/13 1331 11/05/13 0623  Weight: 153 lb 7 oz (69.6 kg) 151 lb 14.4 oz (68.901 kg) 148 lb 12.8 oz (67.495 kg)    PHYSICAL EXAM  General: Pleasant, NAD. Neuro: Alert and oriented X 3. Moves all extremities spontaneously. Psych: Normal affect. HEENT:  Normal  Neck: Supple without bruits or JVD. Lungs:  Resp regular and unlabored, CTA. No significant rale, wheezing or ronchi. Heart: RRR no s3, s4, or murmurs. Abdomen: Soft, non-tender, non-distended, BS + x 4.  Extremities: No clubbing, cyanosis or edema. DP/PT/Radials 2+ and equal bilaterally.  Accessory Clinical Findings  CBC  Recent Labs  11/02/13 1740 11/05/13 0314  WBC 10.3 6.0  HGB 16.0 13.7  HCT 45.3 40.7  MCV  91.1 91.7  PLT 194 0000000   Basic Metabolic Panel  Recent Labs  11/04/13 0245 11/05/13 0314  NA 143 142  K 3.4* 3.9  CL 99 98  CO2 30 33*  GLUCOSE 116* 116*  BUN 23 30*  CREATININE 1.31 1.46*  CALCIUM 8.7 8.9   Liver Function Tests No results found for this basename: AST, ALT, ALKPHOS, BILITOT, PROT, ALBUMIN,  in the last 72 hours No results found for this basename: LIPASE, AMYLASE,  in the last 72 hours Cardiac Enzymes  Recent Labs  11/02/13 2348 11/03/13 0332 11/03/13 1125  TROPONINI <0.30 <0.30 <0.30   BNP No components found with this basename: POCBNP,  D-Dimer No results found for this basename: DDIMER,  in the last 72 hours Hemoglobin A1C  Recent Labs  11/02/13 1916  HGBA1C 6.0*   Fasting Lipid Panel No results found for this basename: CHOL, HDL, LDLCALC, TRIG, CHOLHDL, LDLDIRECT,  in the last 72 hours Thyroid Function Tests  Recent Labs  11/02/13 2348  TSH 5.010*    TELE  NSR with HR 70-80s, 1 episode of NSVT at 3 am this morning, 8 beats run  ECG  Sinus tachycardia with HR 110s  Radiology/Studies  Dg Chest 2 View  11/02/2013   CLINICAL DATA:  HYPERTENSION LEG SWELLING SHORTNESS OF BREATH  EXAM: CHEST  2 VIEW  COMPARISON:  None.  FINDINGS: Moderate cardiomegaly is present. Mediastinal silhouette within normal limits.  Lungs are normally inflated. A small right pleural effusion is present. Associated right basilar opacities most likely reflect atelectasis. The left lung is clear. No pulmonary edema. No pneumothorax.  No acute osseous abnormality.  IMPRESSION: 1. Cardiomegaly without pulmonary edema. 2. Small right pleural effusion with associated right basilar atelectasis.   Electronically Signed   By: Jeannine Boga M.D.   On: 11/02/2013 18:28    ASSESSMENT AND PLAN  1. Malignant Hypertension 2/2 lack of medication and medical follow up since 2012  - continue imdur, lisinopril, increase carvedilol to 25mg  BID  2. Chronic combined  systolic and diastolic dysfunction  - 2/2 long standing HTN  - Echo 11/03/2013 EF 20% , mild LVH, global hypokinesis, grade 3 diastolic dysfunction, RV systolic pressure [id number], trivial pericardial effusion  - plan for L and R heart cath today to exclude CAD  3. Acute systolic CHF: -[id number] since admission, ? Accuracy. -5lb since admission  - diuresed well, no rale on exam, LE edema resolved  - Hold IV lasix as Cr trending up 1.31 --> 1.46, possibly transition to PO lasix after RHC  - avoid over-diuresis with ACEI  - plan to adjust lasix dose before discharge, possibly tomorrow  4. Renal insufficiency of unkown duration, likely chronic  - due to longstanding HTN  - limit contrast dye during procedure  5. NSVT 8 beats run in am of 6/25: ?if need life vest upon discharge  - will need to reevaluate in 3 month with Echo, ?ICD  Signed, Almyra Deforest PA-C Pager: [phone]  As above, patient seen and examined. He denies chest pain or dyspnea. Cardiac catheterization reveals no obstructive coronary disease. Nonischemic cardiomyopathy most likely related to hypertension not controlled. Note pulmonary capillary wedge pressure is 10 and will hold on diuresis for now particularly given renal insufficiency prior to catheterization. Follow renal function post procedure. Continue carvedilol and ACE inhibitor. Add hydralazine/nitrate. Titrate medications to control blood pressure. Once controlled he will need a followup echocardiogram in 3 months. If ejection fraction less than 35% would need ICD. Kirk Ruths

## 2013-11-05 NOTE — Progress Notes (Signed)
Pt received 12.5 coreg this am and non of lasix as instructed by Almyra Deforest, PA.  Will continue to monitor.  Karie Kirks, Therapist, sports.

## 2013-11-05 NOTE — Interval H&P Note (Signed)
History and Physical Interval Note:  11/05/2013 10:15 AM  Romana Juniper  has presented today for surgery, with the diagnosis of hf/cm  The various methods of treatment have been discussed with the patient and family. After consideration of risks, benefits and other options for treatment, the patient has consented to  Procedure(s): LEFT AND RIGHT HEART CATHETERIZATION WITH CORONARY ANGIOGRAM (N/A) as a surgical intervention .  The patient's history has been reviewed, patient examined, no change in status, stable for surgery.  I have reviewed the patient's chart and labs.  Questions were answered to the patient's satisfaction.   Cath Lab Visit (complete for each Cath Lab visit)  Clinical Evaluation Leading to the Procedure:   ACS: no  Non-ACS:    Anginal Classification: CCS III  Anti-ischemic medical therapy: No Therapy  Non-Invasive Test Results: No non-invasive testing performed  Prior CABG: No previous CABG        Collier Salina [facility] 11/05/2013 10:15 AM

## 2013-11-06 ENCOUNTER — Other Ambulatory Visit: Payer: Self-pay | Admitting: Physician Assistant

## 2013-11-06 ENCOUNTER — Encounter (HOSPITAL_COMMUNITY): Payer: Self-pay | Admitting: Physician Assistant

## 2013-11-06 DIAGNOSIS — I5041 Acute combined systolic (congestive) and diastolic (congestive) heart failure: Secondary | ICD-10-CM

## 2013-11-06 LAB — BASIC METABOLIC PANEL
BUN: 24 mg/dL — ABNORMAL HIGH (ref 6–23)
CHLORIDE: 99 meq/L (ref 96–112)
CO2: 28 mEq/L (ref 19–32)
CREATININE: 1.34 mg/dL (ref 0.50–1.35)
Calcium: 8.9 mg/dL (ref 8.4–10.5)
GFR calc non Af Amer: 59 mL/min — ABNORMAL LOW (ref >90)
GFR, EST AFRICAN AMERICAN: 68 mL/min — AB (ref >90)
Glucose, Bld: 96 mg/dL (ref 70–99)
Potassium: 3.7 mEq/L (ref 3.7–5.3)
Sodium: 140 mEq/L (ref 137–147)

## 2013-11-06 MED ORDER — FUROSEMIDE 20 MG PO TABS: 20.0000 mg | ORAL_TABLET | Freq: Every day | ORAL | Status: DC

## 2013-11-06 MED ORDER — HYDRALAZINE HCL 25 MG PO TABS: 25.0000 mg | ORAL_TABLET | Freq: Three times a day (TID) | ORAL | Status: DC

## 2013-11-06 MED ORDER — LISINOPRIL 20 MG PO TABS: 20.0000 mg | ORAL_TABLET | Freq: Every day | ORAL | Status: DC

## 2013-11-06 MED ORDER — CARVEDILOL 25 MG PO TABS: 25.0000 mg | ORAL_TABLET | Freq: Two times a day (BID) | ORAL | Status: DC

## 2013-11-06 MED ORDER — ISOSORBIDE MONONITRATE ER 30 MG PO TB24: 30.0000 mg | ORAL_TABLET | Freq: Two times a day (BID) | ORAL | Status: DC

## 2013-11-06 NOTE — Discharge Instructions (Signed)
Cardiomyopathy Cardiomyopathy means a disease of the heart muscle. The heart muscle becomes enlarged or stiff. The heart is not able to pump enough blood or deliver enough oxygen to the body. This leads to heart failure and is the number one reason for heart transplants.  TYPES OF CARDIOMYOPATHY INCLUDE: DILATED  The most common type. The heart muscle is stretched out and weak so there is less blood pumped out.   Some causes:  Disease of the arteries of the heart (ischemia).  Heart attack with muscle scar.  Leaky or damaged valves.  After a viral illness.  Smoking.  High cholesterol.  Diabetes or overactive thyroid.  Alcohol or drug abuse.  High blood pressure.  May be reversible. HYPERTROPHIC The heart muscle grows bigger so there is less room for blood in the ventricle, and not enough blood is pumped out.   Causes include:  Mitral valve leaks.  Inherited tendency (from your family).  No explanation (idiopathic).  May be a cause of sudden death in young athletes with no symptoms. RESTRICTIVE The heart muscle becomes stiff, but not always larger. The heart has to work harder and will get weaker. Abnormal heart beats or rhythm (arrhythmia) are common.  Some causes:  Diseases in other parts of the body which may produce abnormal deposits in the heart muscle.  Probably not inherited.  A result of radiation treatment for cancer. SYMPTOMS OF ALL TYPES:  Less able to exercise or tolerate physical activity.  Palpitations.  Irregular heart beat, heart arrhythmias.  Shortness of breath, even at rest.  Chest pain.  Lightheadedness or fainting. TREATMENT  Life-style changes including reducing salt, lowering cholesterol, stop smoking.  Manage contributing causes with medications.  Medicines to help reduce the fluids in the body.  An implanted cardioverter defibrillator (ICD) to improve heart function and correct arrhythmias.  Medications to relax the blood  vessels and make it easier for the heart to pump.  Drugs that help regulate heart beat and improve heart relaxation, reducing the work of the heart.  Myomectomy for patients with hypertrophic cardiomyopathy and severe problems. This is a surgical procedure that removes a portion of the thickened muscle wall in order to improve heart output and provide symptom relief.  A heart transplant is an option in carefully applied circumstances. SEEK IMMEDIATE MEDICAL CARE IF:   You have severe chest pain, especially if the pain is crushing or pressure-like and spreads to the arms, back, neck, or jaw, or if you have sweating, feeling sick to your stomach (nausea), or shortness of breath. THIS IS AN EMERGENCY. Do not wait to see if the pain will go away. Get medical help at once. Call your local emergency services (911 in [place] DO NOT drive yourself to the hospital.  You develop severe shortness of breath.  You begin to cough up bloody sputum.  You are unable to sleep because you cannot breathe.  You gain weight due to fluid retention.  You develop painful swelling in your calf or leg.  You feel your heart racing and it does not go away or happens when you are resting. Document Released: 07/13/2004 Document Revised: 07/23/2011 Document Reviewed: 12/17/2007 [facility] Patient Information 2015 [place], [place]. This information is not intended to replace advice given to you by your health care provider. Make sure you discuss any questions you have with your health care provider.  Call Cardiology group if weight increase by more than 3 lbs overnight or more than 5 lbs in a single week. Keep  daily fluid intake to <2 L person. Avoid excessive salt intake.

## 2013-11-06 NOTE — Progress Notes (Signed)
Heart Failure Navigator Consult Note  Presentation: Jeremy Grant  is a 53yo WM with a history of HTN (on no meds) who presented to the ER with complaints of SOB and LE edema for about 3 weeks. The edema has occurred for 3 weeks and SOB for about 5 days. The SOB occurred with exertion and with lying flat in bed. He has had orthopnea and last night he had to sit propped up to sleep. He says that he has not been able to get comfortable in bed due to SOB. He denies any PND. He denies any chest pain or pressure. He denies any diaphoresis, nausea or vomiting. He does not use table salt, although his wife occasionally cooks with sea salt. In the ER his BP was 204/185mmHg. He says that he has not seen a PCP since 2012.    Past Medical History  Diagnosis Date  . Hypertension   . Combined systolic and diastolic cardiac dysfunction     Echo 11/03/2013 EF 123456, grade 3 diastolic dysfunction    History   Social History  . Marital Status: Married    Spouse Name: N/A    Number of Children: N/A  . Years of Education: N/A   Social History Main Topics  . Smoking status: Never Smoker   . Smokeless tobacco: None  . Alcohol Use: 5.4 oz/week    9 Cans of beer per week     Comment: 9 beers at least a night,   . Drug Use: No  . Sexual Activity: None   Other Topics Concern  . None   Social History Narrative  . None    ECHO:Study Conclusions  - Left ventricle: The cavity size was normal. Wall thickness was increased in a pattern of mild LVH. There was mild concentric hypertrophy. The estimated ejection fraction was 20%. Severe global hypokinesis. Doppler parameters are consistent with restrictive left ventricular relaxation (grade 3 diastolic dysfunction). The E/A ratio is 2.3. The E/e&' ratio is >30, suggsesting markedly elevated LV filling pressure. - Mitral valve: Mildly thickened leaflets . There was mild regurgitation. - Left atrium: Moderately dilated (46 ml/m2). - Right ventricle: RV  systolic pressure (S, est): 51 mm Hg. - Right atrium: The atrium was mildly dilated. - Atrial septum: No defect or patent foramen ovale was identified. - Tricuspid valve: There was moderate regurgitation. - Pulmonary arteries: PA peak pressure: 51 mm Hg (S). - Pericardium, extracardiac: A trivial pericardial effusion was identified. Features were not consistent with tamponade physiology.  Impressions:  - Severe global hypokinesis, LVEF 20%, preserved RV function, moderate LA dilitation, trace to mild MR, Trivial pericardial effusion, restrictive diastolic pattern with very high LV filling pressure. No prior echo is available for comparison.  Transthoracic echocardiography. M-mode, complete 2D, spectral Doppler, and color Doppler. Birthdate: Patient birthdate: Sep 02, 1960. Age: Patient is 54 yr old. Sex: Gender: male. Height: Height: 185.4 cm. Height: 73 in. Weight: Weight: 73 kg. Weight: 160.7 lb. Body mass index: BMI: 21.2 kg/m^2. Body surface area: BSA: 1.93 m^2. Blood pressure: 163/99 Patient status: Inpatient. Study date: Study date: 11/03/2013. Study time: 08:53 AM. Location: Bedside.   BNP    Component Value Date/Time   PROBNP 9042.0* 11/02/2013 1740    Education Assessment and Provision:  Detailed education and instructions provided on heart failure disease management including the following:  Signs and symptoms of Heart Failure When to call the physician Importance of daily weights Low sodium diet Fluid restriction Medication management Anticipated future follow-up appointments  Patient education  given on each of the above topics.  Patient and wife acknowledge understanding and acceptance of all instructions.  Pt is diagnosed with new HF.  He admits that he has not been taking medications for Htn.  He has a scale and he and his wife say that daily weights will be no issue.  She claims that they eat mostly fresh and very little salt.  She does admit to using some sea  salt recently--however says that they will "throw it away".  They seem very willing to learn and receptive to all HF recommendations.    Education Materials:  "Living Better With Heart Failure" Booklet, Daily Weight Tracker Tool    High Risk Criteria for Readmission and/or Poor Patient Outcomes:  (Recommend Follow-up with Advanced [facility] Failure Clinic)--Yes outpatient for transition with newly diagnosed HF   EF <30%- Yes-EF 20% and Grade 3 dias dys  2 or more admissions in 6 months- No  Difficult social situation- No  Demonstrates medication noncompliance- Yes- not taking mediations for Htn previously   Barriers of Care:  Knowledge of HF and HF recommendations, compliance  Discharge Planning:   Plans to discharge with wife.  They would benefit from a quick outpatient follow -up appt and expressed interest in the Guide-It trial--for this reason perhaps they would be appropriate for outpatient follow-up in the AHF clinic.

## 2013-11-06 NOTE — Discharge Summary (Signed)
Discharge Summary   Patient ID: Jeremy Grant,  MRN: [id number], DOB/AGE: 1961-01-24 53 y.o.  Admit date: 11/02/2013 Discharge date: 11/06/2013  Primary Care Provider: Leonard Downing Primary Cardiologist: Dr. Angelena Form  Discharge Diagnoses Principal Problem:   Malignant hypertension Active Problems:   Acute diastolic CHF (congestive heart failure), NYHA class 4   CHF (congestive heart failure)   Acute systolic CHF (congestive heart failure), NYHA class 4   Congestive dilated cardiomyopathy   Allergies No Known Allergies  Procedures  Transthoracic Echocardiography   ------------------------------------------------------------------- LV EF: 20%  ------------------------------------------------------------------- Indications: CHF - 428.0.  ------------------------------------------------------------------- History: Risk factors: Hypertension.  ------------------------------------------------------------------- Study Conclusions  - Left ventricle: The cavity size was normal. Wall thickness was increased in a pattern of mild LVH. There was mild concentric hypertrophy. The estimated ejection fraction was 20%. Severe global hypokinesis. Doppler parameters are consistent with restrictive left ventricular relaxation (grade 3 diastolic dysfunction). The E/A ratio is 2.3. The E/e&' ratio is >30, suggsesting markedly elevated LV filling pressure. - Mitral valve: Mildly thickened leaflets . There was mild regurgitation. - Left atrium: Moderately dilated (46 ml/m2). - Right ventricle: RV systolic pressure (S, est): 51 mm Hg. - Right atrium: The atrium was mildly dilated. - Atrial septum: No defect or patent foramen ovale was identified. - Tricuspid valve: There was moderate regurgitation. - Pulmonary arteries: PA peak pressure: 51 mm Hg (S). - Pericardium, extracardiac: A trivial pericardial effusion was identified. Features were not consistent with  tamponade physiology.  Impressions:  - Severe global hypokinesis, LVEF 20%, preserved RV function, moderate LA dilitation, trace to mild MR, Trivial pericardial effusion, restrictive diastolic pattern with very high LV filling pressure. No prior echo is available for comparison.   Left and Right heart catheterization Procedural Findings:  Hemodynamics  RA 8/5 mean 3 mm Hg  RV 52/5 mm Hg  PA 49/19 mean 31 mm Hg  PCWP 13/11 mean 10 mm Hg  LV 165/15 mm Hg  AO 166/93 mean 125 mm Hg  Oxygen saturations:  PA 67%  AO 91%  Cardiac Output (Fick) 5.71 L/min  Cardiac Index (Fick) 2.97 L/min/meter squared  Coronary angiography:  Coronary dominance: right  Left mainstem: Normal  Left anterior descending (LAD): Large vessel. The LAD is without significant disease. The first diagonal has 20% disease at the ostium.  Left circumflex (LCx): Normal.  Right coronary artery (RCA): 30% stenosis at the bifurcation of the PDA and PLOM.  Left ventriculography: not done  Final Conclusions:  1. Minimal nonobstructive CAD  2. Mild to moderate pulmonary HTN  3. Normal LV filling pressures  Recommendations: Medical management.   Hospital Course  The patient is a 53 year old Caucasian male with past medical history significant for hypertension who presented to the ER on 11/02/2013 with complaint of shortness of breath and lower extremity edema for 3 weeks. According to the patient, he was not on any medication for blood pressure and he has not seen his PCP since 2012. On arrival, his blood pressure was 204/125. He was also showing sign of kidney injury secondary to chronic uncontrolled hypertension.  Given his malignant hypertension, patient was admitted to step down bed and started on IV nitroglycerin drip and IV Lasix. Lopressor 25 mg twice a day and lisinopril were added to his medical regimen to provide further blood pressure control. Echocardiogram was obtained on 11/03/2013 which showed EF 20%, grade  3 diastolic dysfunction, moderately dilated left atrium, RV systolic pressure of 51 mmHg. patient was seen by Dr. Angelena Form on 11/04/2013,  Nitro gtt was stopped and Imdur was added. Due to drastically declined ejection fraction, further discussion was held with the family regarding possible diagnostic tests. Eventually he agreed a left and right heart cath. He underwent the planned procedure on 11/05/2013 which revealed no significant obstructive disease, mild to moderate pulmonary hypertension, normal LV filling pressure. He was switched to Coreg 25 mg twice a day. Hydralazine was added to Imdur to provide further blood pressure coverage and help with heart failure symptoms. Patient did have one episode of 8 beats run of nonsustained VT in the morning of 11/05/2013, however there has been no recurrence.   Patient was seen in the morning of 11/06/2013 by Dr. Stanford Breed, at which time, he denies any significant chest discomfort or shortness breath. His lower extremity edema has largely resolved after diuresis. He'll be transitioned to Lasix 20 mg daily. We will obtain BMET in the office in one week to follow his kidney function and potassium. Followup with Dr. Angelena Form in 2-4 weeks. Due to his severely decreased ejection fraction, patient will need a repeat echocardiogram in 3 month, if continued to be low, he will be considered for ICD.   Patient has been advised to keep daily intake of fluid less than 2 L and avoid excessive salt intake. He has been advised to call cardiology group if his daily weight increases by more than 3 lbs per day, or more than 5 pounds in a single week. If he presents any syncope, dizziness, chest discomfort or increasing shortness breath, the patient has been advised to seek medical attention immediately.    Discharge Vitals Blood pressure 158/76, pulse 74, temperature 98.1 F (36.7 C), temperature source Oral, resp. rate 16, height 6\' 1"  (1.854 m), weight 149 lb 1.6 oz (67.631 kg),  SpO2 99.00%.  Filed Weights   11/04/13 1331 11/05/13 0623 11/06/13 0550  Weight: 151 lb 14.4 oz (68.901 kg) 148 lb 12.8 oz (67.495 kg) 149 lb 1.6 oz (67.631 kg)    Labs  CBC  Recent Labs  11/05/13 0314 11/05/13 1320  WBC 6.0 5.6  HGB 13.7 14.3  HCT 40.7 41.7  MCV 91.7 91.6  PLT 152 [id number]*   Basic Metabolic Panel  Recent Labs  11/05/13 0314 11/05/13 1320 11/06/13 0445  NA 142  --  140  K 3.9  --  3.7  CL 98  --  99  CO2 33*  --  28  GLUCOSE 116*  --  96  BUN 30*  --  24*  CREATININE 1.46* 1.15 1.34  CALCIUM 8.9  --  8.9   Cardiac Enzymes  Recent Labs  11/03/13 1125  TROPONINI <0.30    Disposition  Pt is being discharged home today in good condition.  Follow-up Plans & Appointments      Follow-up Information   Follow up with Ermalinda Barrios, PA-C On 11/16/2013. (12:15pm)    Specialty:  Cardiology   Contact information:   [address] [place] [address] [phone]       Follow up with [facility] On 11/12/2013. (BMET in 1 week. Come any time. )    Specialty:  Cardiology   Contact information:   [address], [address] [place] [address] [phone]      Discharge Medications    Medication List         carvedilol 25 MG tablet  Commonly known as:  COREG  Take 1 tablet (25 mg total) by mouth 2 (two) times  daily with a meal.     furosemide 20 MG tablet  Commonly known as:  LASIX  Take 1 tablet (20 mg total) by mouth daily.  Start taking on:  11/07/2013     hydrALAZINE 25 MG tablet  Commonly known as:  APRESOLINE  Take 1 tablet (25 mg total) by mouth every 8 (eight) hours.     isosorbide mononitrate 30 MG 24 hr tablet  Commonly known as:  IMDUR  Take 1 tablet (30 mg total) by mouth every 12 (twelve) hours.     lisinopril 20 MG tablet  Commonly known as:  PRINIVIL,ZESTRIL  Take 1 tablet (20 mg total) by mouth daily.     naphazoline-pheniramine 0.025-0.3 % ophthalmic solution  Commonly known  as:  NAPHCON-A  Place 1 drop into both eyes 4 (four) times daily as needed for irritation.        Outstanding Labs/Studies  BMET in clinic on 11/12/2013  Duration of Discharge Encounter   Greater than 30 minutes including physician time.  Signed, Almyra Deforest PA-C 11/06/2013, 10:52 AM

## 2013-11-06 NOTE — Progress Notes (Signed)
The patient's right radial cardiac cath site remained at a Level 0 overnight.  He did not have any complaints of pain and did not receive any PRN medications overnight.  His blood pressure is 158/97 this morning.

## 2013-11-06 NOTE — Plan of Care (Signed)
Problem: Phase I Progression Outcomes Goal: Vascular site scale level 0 - I Vascular Site Scale Level 0: No bruising/bleeding/hematoma Level I (Mild): Bruising/Ecchymosis, minimal bleeding/ooozing, palpable hematoma < 3 cm Level II (Moderate): Bleeding not affecting hemodynamic parameters, pseudoaneurysm, palpable hematoma > 3 cm Level III (Severe) Bleeding which affects hemodynamic parameters or retroperitoneal hemorrhage  Outcome: Completed/Met Date Met:  11/06/13 Rt radial TR band site level 0

## 2013-11-06 NOTE — Discharge Summary (Signed)
See progress notes Brian Crenshaw  

## 2013-11-06 NOTE — Plan of Care (Signed)
Problem: Phase I Progression Outcomes Goal: EF % per last Echo/documented,Core Reminder form on chart Outcome: Completed/Met Date Met:  11/06/13 EF 20%

## 2013-11-06 NOTE — Progress Notes (Signed)
All d/c instructions explained as given to pt by [facility]  charge nurse.  Transported off floor to awaiting transport.  Karie Kirks, Therapist, sports.

## 2013-11-06 NOTE — Progress Notes (Signed)
Patient Name: Jeremy Grant Date of Encounter: 11/06/2013     Active Problems:   Acute diastolic CHF (congestive heart failure), NYHA class 4   CHF (congestive heart failure)   Acute systolic CHF (congestive heart failure), NYHA class 4   Congestive dilated cardiomyopathy    SUBJECTIVE  Feeling well, denies any chest discomfort or SOB.   CURRENT MEDS . carvedilol  25 mg Oral BID WC  . furosemide  40 mg Oral Daily  . heparin  5,000 Units Subcutaneous 3 times per day  . hydrALAZINE  25 mg Oral 3 times per day  . isosorbide mononitrate  30 mg Oral Q12H  . lisinopril  20 mg Oral Daily  . sodium chloride  3 mL Intravenous Q12H    OBJECTIVE  Filed Vitals:   11/05/13 1842 11/05/13 2100 11/06/13 0212 11/06/13 0550  BP: 126/75 128/78 146/68 158/97  Pulse: 73 76 73 70  Temp: 97.9 F (36.6 C) 97.8 F (36.6 C) 98.1 F (36.7 C) 98.1 F (36.7 C)  TempSrc: Oral Oral Oral Oral  Resp: 20 20 20 16   Height:      Weight:    149 lb 1.6 oz (67.631 kg)  SpO2: 96% 98% 98% 99%    Intake/Output Summary (Last 24 hours) at 11/06/13 0840 Last data filed at 11/06/13 0832  Gross per 24 hour  Intake   1935 ml  Output   1325 ml  Net    610 ml   Filed Weights   11/04/13 1331 11/05/13 0623 11/06/13 0550  Weight: 151 lb 14.4 oz (68.901 kg) 148 lb 12.8 oz (67.495 kg) 149 lb 1.6 oz (67.631 kg)    PHYSICAL EXAM  General: Pleasant, NAD. Neuro: Alert and oriented X 3. Moves all extremities spontaneously. Psych: Normal affect. HEENT:  Normal  Neck: Supple without bruits or JVD. Lungs:  Resp regular and unlabored, CTA. No significant rale, rhonchi or wheezing Heart: RRR no s3, s4, or murmurs. Abdomen: Soft, non-tender, non-distended, BS + x 4.  Extremities: No clubbing, cyanosis or edema. DP/PT/Radials 2+ and equal bilaterally.  Accessory Clinical Findings  CBC  Recent Labs  11/05/13 0314 11/05/13 1320  WBC 6.0 5.6  HGB 13.7 14.3  HCT 40.7 41.7  MCV 91.7 91.6  PLT 152 147*     Basic Metabolic Panel  Recent Labs  11/05/13 0314 11/05/13 1320 11/06/13 0445  NA 142  --  140  K 3.9  --  3.7  CL 98  --  99  CO2 33*  --  28  GLUCOSE 116*  --  96  BUN 30*  --  24*  CREATININE 1.46* 1.15 1.34  CALCIUM 8.9  --  8.9   Cardiac Enzymes  Recent Labs  11/03/13 1125  TROPONINI <0.30    TELE  NSR with HR 60s, no significant ventricular ectopy  ECG  No recent EKG  Radiology/Studies  Dg Chest 2 View  11/02/2013   CLINICAL DATA:  HYPERTENSION LEG SWELLING SHORTNESS OF BREATH  EXAM: CHEST  2 VIEW  COMPARISON:  None.  FINDINGS: Moderate cardiomegaly is present. Mediastinal silhouette within normal limits.  Lungs are normally inflated. A small right pleural effusion is present. Associated right basilar opacities most likely reflect atelectasis. The left lung is clear. No pulmonary edema. No pneumothorax.  No acute osseous abnormality.  IMPRESSION: 1. Cardiomegaly without pulmonary edema. 2. Small right pleural effusion with associated right basilar atelectasis.   Electronically Signed   By: Pincus Badder.D.  On: 11/02/2013 18:28    ASSESSMENT AND PLAN  1. Malignant Hypertension 2/2 lack of medication and medical follow up since 2012   - continue imdur/hydralazine, lisinopril, cadrvedilol  - BP better controlled, however trended back up this am, will like come down after morning BP  2. Chronic combined systolic and diastolic dysfunction   - 2/2 long standing HTN   - Echo 11/03/2013 EF 20% , mild LVH, global hypokinesis, grade 3 diastolic dysfunction, RV systolic pressure [id number], trivial pericardial effusion   - L&R Cath 11/05/2013 minimal nonobstructive disease, PA peak 49, RV 52/5, mean wedge 91mmHg  - can potentially consider restart 20mg  or 40mg  PO lasix daily given his poor LV function  - recheck BMET in clinic in 1 week, patient and his wife prefer to follow up with Dr. Angelena Form  - possible discharge today   3. Acute systolic CHF: [id number] since  admission, ? Accuracy. -5lb since admission   - diuresed well, no rale on exam, LE edema resolved   - avoid over-diuresis with ACEI    4. Renal insufficiency of unkown duration, likely chronic   - due to longstanding HTN   - creatinine stable after cath, Cr 1.34  5. NSVT 8 beats run in am of 6/25 - no recurrence  - need repeat echo in 3 month and if continue to be low consider for ICD   Signed, Almyra Deforest PA-C Pager: [phone] As above, patient seen and examined. His congestive heart failure is much improved. His blood pressure has improved as well. Cardiac catheterization revealed no obstructive coronary disease. Cardiomyopathy most likely either related to alcohol or uncontrolled hypertension. Plan to continue beta blocker, ACE inhibitor, hydralazine and nitrates. Change Lasix to 20 mg daily. Discharge today. Check potassium and renal function in one week. Followup with Dr. Angelena Form in 2-4 weeks. Repeat echocardiogram 3 months after medications fully titrated and alcohol discontinued. Hopefully LV function will have improved. If not would need to consider ICD. > 30 min PA and physician time D2 Kirk Ruths

## 2013-11-12 ENCOUNTER — Encounter (HOSPITAL_COMMUNITY): Payer: Self-pay | Admitting: Anesthesiology

## 2013-11-12 ENCOUNTER — Ambulatory Visit (HOSPITAL_COMMUNITY)
Admit: 2013-11-12 | Discharge: 2013-11-12 | Disposition: A | Discharge status: Home / Self Care | Payer: BC Managed Care – PPO | Source: Ambulatory Visit | Attending: Internal Medicine | Admitting: Internal Medicine

## 2013-11-12 ENCOUNTER — Other Ambulatory Visit: Payer: BC Managed Care – PPO

## 2013-11-12 ENCOUNTER — Encounter (HOSPITAL_COMMUNITY): Payer: Self-pay

## 2013-11-12 VITALS — BP 134/74 | HR 65 | Resp 16 | Wt 143.2 lb

## 2013-11-12 DIAGNOSIS — I5022 Chronic systolic (congestive) heart failure: Secondary | ICD-10-CM

## 2013-11-12 DIAGNOSIS — F1011 Alcohol abuse, in remission: Secondary | ICD-10-CM

## 2013-11-12 DIAGNOSIS — I428 Other cardiomyopathies: Secondary | ICD-10-CM | POA: Insufficient documentation

## 2013-11-12 DIAGNOSIS — I10 Essential (primary) hypertension: Secondary | ICD-10-CM | POA: Insufficient documentation

## 2013-11-12 DIAGNOSIS — I5042 Chronic combined systolic (congestive) and diastolic (congestive) heart failure: Secondary | ICD-10-CM | POA: Insufficient documentation

## 2013-11-12 DIAGNOSIS — I509 Heart failure, unspecified: Secondary | ICD-10-CM | POA: Insufficient documentation

## 2013-11-12 HISTORY — DX: Other cardiomyopathies: I42.8

## 2013-11-12 LAB — TSH: TSH: 8.42 u[IU]/mL — ABNORMAL HIGH (ref 0.350–4.500)

## 2013-11-12 LAB — T4, FREE: FREE T4: 1.28 ng/dL (ref 0.80–1.80)

## 2013-11-12 MED ORDER — HYDRALAZINE HCL 25 MG PO TABS: 37.5000 mg | ORAL_TABLET | Freq: Three times a day (TID) | ORAL | Status: DC

## 2013-11-12 NOTE — Patient Instructions (Addendum)
Increase hydralazine to 37.5 mg (1 1/2 tablets) three times a day.  Continue taking all other medications as prescribed.  Call any issues.  Follow up in 2 weeks  Will call with lab results.  Do the following things EVERYDAY: 1) Weigh yourself in the morning before breakfast. Write it down and keep it in a log. 2) Take your medicines as prescribed 3) Eat low salt foods-Limit salt (sodium) to 2000 mg per day.  4) Stay as active as you can everyday 5) Limit all fluids for the day to less than 2 liters 6)

## 2013-11-12 NOTE — Progress Notes (Signed)
PCP: Dr. Claris Gower Primary Cardiologist: Dr. Angelena Form  HPI: Mr. Tillema is a 53 yo male with a history of HTN, CKD stage III, NICM and newly diagnosed combined systolic/diastolic heart failure.   Admitted 6/22-6/26/15 with hypertensive urgency. Was treated with medications and underwent L/RHC showing no significant CAD, mild/mod pulmonary HTN and normal filling pressures. D/C weight 149 lbs  ECHOs (11/03/13) EF 20%, grade III DD  Follow up for Heart Failure: Doing well. Denies SOB, PND, orthopnea or CP. No edema. Able to walk 1/2 mile every day. Following a low salt diet and drinking less than 2L a day. Weight stable 141-143 lbs. Denies heart palpitations.   SH: Married and lives in [place]. No children. 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 Denies ETOH or tobacco abuse. Previously drank 8-9 beers a day.   FH: Mother living: pre-diabetic        Father living: HTN, CAD, ICD   ROS: All systems negative except as listed in HPI, PMH and Problem List.  Past Medical History  Diagnosis Date  . Hypertension   . Combined systolic and diastolic cardiac dysfunction     Echo 11/03/2013 EF 123456, grade 3 diastolic dysfunction  . NICM (nonischemic cardiomyopathy)     a. L/RHC (11/05/13): RA: 3, RV 52/5, PA 49/19 (31), PCWP 10, AO 166/93, PA 67%, Fick CO/CI: 5.71/2.97, Lmain: normal, LAD: large, without signficant dz, first diagonal has 20% dz at ostium, LCx: normal, RCA: 30% stenosis at the bifurcation of PDA and PLOM    Current Outpatient Prescriptions  Medication Sig Dispense Refill  . carvedilol (COREG) 25 MG tablet Take 1 tablet (25 mg total) by mouth 2 (two) times daily with a meal.  30 tablet  6  . furosemide (LASIX) 20 MG tablet Take 1 tablet (20 mg total) by mouth daily.  30 tablet  3  . hydrALAZINE (APRESOLINE) 25 MG tablet Take 1 tablet (25 mg total) by mouth every 8 (eight) hours.  30 tablet  6  . isosorbide mononitrate (IMDUR) 30 MG 24 hr tablet Take 1 tablet (30 mg total) by mouth  every 12 (twelve) hours.  30 tablet  6  . lisinopril (PRINIVIL,ZESTRIL) 20 MG tablet Take 1 tablet (20 mg total) by mouth daily.  30 tablet  6  . naphazoline-pheniramine (NAPHCON-A) 0.025-0.3 % ophthalmic solution Place 1 drop into both eyes 4 (four) times daily as needed for irritation.       No current facility-administered medications for this encounter.    Filed Vitals:   11/12/13 1001  BP: 134/74  Pulse: 65  Resp: 16  Weight: 143 lb 4 oz (64.978 kg)  SpO2: 98%    PHYSICAL EXAM:  General:  Well appearing. No resp difficulty HEENT: normal Neck: supple. JVP flat. Carotids 2+ bilaterally; no bruits. No lymphadenopathy or thryomegaly appreciated. Cor: PMI normal. Regular rate & rhythm. No rubs, gallops or murmurs. Lungs: clear Abdomen: soft, nontender, nondistended. No hepatosplenomegaly. No bruits or masses. Good bowel sounds. Extremities: no cyanosis, clubbing, rash, edema Neuro: alert & orientedx3, cranial nerves grossly intact. Moves all 4 extremities w/o difficulty. Affect pleasant.  ASSESSMENT & PLAN:  1) Chronic combined systolic/diastolic: NICM, (?HTN or ETOH induced), EF 20%, grade III DD (10/2013) - Reviewed the discharge summary and patient recently admitted with hypertension urgency and newly diagnosed HF. Underwent cath showing no obstructive disease. Discharge weight 149 lbs. - NYHA II symptoms and volume status stable. Will continue lasix 20 mg daily. Discussed the use of sliding scale diuretics.  -  On goal dose of Coreg 25 mg BID - Continue lisinopril 20 mg daily. Will not titrate until results of BMET. - Would like to add Spironolactone to HF regimen, however wife and patient adamant do not want any more medications so I will increase hydralazine to 37.5 mg TID. Continue Imdur 3 mg daily.  - Discussed the role of the transitions clinic. Patient would like to enroll in [facility] which we will facilitate.  - Will need repeat ECHO end of 01/2014 to reassess EF. If  remains less than 35% will refer to EP for ICD. QRS narrow no CRT. Had 8 beat run of Vtach in hospital and LifeVest was discussed. He would like to hold off for now. - Reinforced the need and importance of daily weights, a low sodium diet, and fluid restriction (less than 2 L a day). Instructed to call the HF clinic if weight increases more than 3 lbs overnight or 5 lbs in a week.  2) HTN - Stable. As above will increase hydralazine to 37.5 mg TID. Continue other medications at current doses. 3) Previous ETOH - Patient reports he used to drink 8-9 beers a night. This could have led to his cardiomyopathy and we discussed the need to abstain from ETOH. He has not had any alcohol since being admitted to the hospital and I congratulated him on this accomplishment.    [name] BNP-C 7:08 PM

## 2013-11-14 ENCOUNTER — Telehealth: Payer: Self-pay | Admitting: Physician Assistant

## 2013-11-14 MED ORDER — HYDRALAZINE HCL 25 MG PO TABS: 37.5000 mg | ORAL_TABLET | Freq: Three times a day (TID) | ORAL | Status: DC

## 2013-11-14 NOTE — Telephone Encounter (Signed)
Ms. Carraher called because at his recent discharge, she thought he was supposed to be on hydralazine and this was not on his discharge meds list. He had some chills at home but was running out and she was not sure what to do.  Review the discharge summary and the patient was supposed to be on the hydralazine 3 times a day. Sent an electronic prescription in to the pharmacy and confirmed the pharmacy location with her as her usual pharmacy is closed today. He is otherwise doing well, no current problems or concerns.

## 2013-11-16 ENCOUNTER — Ambulatory Visit: Payer: BC Managed Care – PPO | Admitting: Physician Assistant

## 2013-11-16 ENCOUNTER — Telehealth (HOSPITAL_COMMUNITY): Payer: Self-pay

## 2013-11-16 ENCOUNTER — Encounter (HOSPITAL_COMMUNITY): Payer: Self-pay | Admitting: Adult Health

## 2013-11-16 MED ORDER — LISINOPRIL 10 MG PO TABS
10.0000 mg | ORAL_TABLET | Freq: Every day | ORAL | Status: DC
Start: 2013-11-16 — End: 2014-01-06

## 2013-11-16 NOTE — Telephone Encounter (Signed)
Lab results reviewed with patient's wife, instructed to decrease lisinopril to 10mg  once daily, recommended cutting current 20mg  tablets in half with pill splitter we provided until they run out.  Aware and agreeable.  Will recheck BMET at next appointment next week with [place]. Renee Pain

## 2013-11-16 NOTE — Progress Notes (Signed)
Patient ID: Jeremy Grant, male   DOB: 1960/05/19, 53 y.o.   MRN: [id number]  Reviewed BMET from 11/12/13 K 5.4 Creatinine 1.8   Last BMET Creatinine was 1.34.   Will need to cut back lisinopril to 10 mg dialy. He is not on potassium of sprionolactone.   Repeat BMET next week. Vilinda Blanks RN to contact him regarding the above.    Aven Christen NP-C  1:08 PM

## 2013-11-19 ENCOUNTER — Encounter: Payer: Self-pay | Admitting: Physician Assistant

## 2013-11-25 NOTE — Progress Notes (Signed)
Patient ID: Jeremy Grant, male   DOB: [birth year], 53 y.o.   MRN: [id number] PCP: Dr. Claris Gower Primary Cardiologist: Dr. Angelena Form  HPI: Jeremy Grant is a 53 yo male with a history of HTN, CKD stage III, NICM and newly diagnosed combined systolic/diastolic heart failure.   Admitted 6/22-6/26/15 with hypertensive urgency. Was treated with medications and underwent L/RHC showing no significant CAD, mild/mod pulmonary HTN and normal filling pressures. D/C weight 149 lbs  ECHOs (11/03/13) EF 20%, grade III DD  Follow up for Heart Failure: Last visit hydralazine increased to 37.5 mg TID and lisinopril cut back to 10 mg daily. Overall doing well. Denies SOB, CP, PND or edema. Back to work FT and having minimal fatigue. Able to walk upstairs with no issues. Walking about 1/2 mile at work. Weight at home 140-145 lbs. Denies heart palpitations. SBP at home 111-149.  Labs:  (11/06/13): K 3.7, creatinine 1.34 (11/12/13): K 5.4, creatinine 1.8  SH: Married and lives in [place]. No children. 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 Denies ETOH or tobacco abuse. Previously drank 8-9 beers a day.   FH: Mother living: pre-diabetic        Father living: HTN, CAD, ICD   ROS: All systems negative except as listed in HPI, PMH and Problem List.  Past Medical History  Diagnosis Date  . Hypertension   . Combined systolic and diastolic cardiac dysfunction     Echo 11/03/2013 EF 123456, grade 3 diastolic dysfunction  . NICM (nonischemic cardiomyopathy)     a. L/RHC (11/05/13): RA: 3, RV 52/5, PA 49/19 (31), PCWP 10, AO 166/93, PA 67%, Fick CO/CI: 5.71/2.97, Lmain: normal, LAD: large, without signficant dz, first diagonal has 20% dz at ostium, LCx: normal, RCA: 30% stenosis at the bifurcation of PDA and PLOM    Current Outpatient Prescriptions  Medication Sig Dispense Refill  . carvedilol (COREG) 25 MG tablet Take 1 tablet (25 mg total) by mouth 2 (two) times daily with a meal.  30 tablet  6  . furosemide (LASIX)  20 MG tablet Take 1 tablet (20 mg total) by mouth daily.  30 tablet  3  . hydrALAZINE (APRESOLINE) 25 MG tablet Take 1.5 tablets (37.5 mg total) by mouth 3 (three) times daily.  135 tablet  11  . isosorbide mononitrate (IMDUR) 30 MG 24 hr tablet Take 1 tablet (30 mg total) by mouth every 12 (twelve) hours.  30 tablet  6  . lisinopril (PRINIVIL,ZESTRIL) 10 MG tablet Take 1 tablet (10 mg total) by mouth daily.  30 tablet  6  . naphazoline-pheniramine (NAPHCON-A) 0.025-0.3 % ophthalmic solution Place 1 drop into both eyes 4 (four) times daily as needed for irritation.       No current facility-administered medications for this encounter.    Filed Vitals:   11/26/13 0916  BP: 148/81  Pulse: 56  Resp: 18  Weight: 146 lb 4 oz (66.339 kg)  SpO2: 99%    PHYSICAL EXAM:  General:  Well appearing. No resp difficulty HEENT: normal Neck: supple. JVP flat. Carotids 2+ bilaterally; no bruits. No lymphadenopathy or thryomegaly appreciated. Cor: PMI normal. Regular rate & rhythm. No rubs, gallops or murmurs. Lungs: clear Abdomen: soft, nontender, nondistended. No hepatosplenomegaly. No bruits or masses. Good bowel sounds. Extremities: no cyanosis, clubbing, rash, edema Neuro: alert & orientedx3, cranial nerves grossly intact. Moves all 4 extremities w/o difficulty. Affect pleasant.  ASSESSMENT & PLAN:  1) Chronic combined systolic/diastolic: NICM, (?HTN or ETOH induced), EF 20%, grade III  DD (10/2013) - NYHA II symptoms. He appears a little dry and last creatinine was elevated. Will change lasix Will continue lasix 20 mg daily. Discussed the use of sliding scale diuretics.  - On goal dose of Coreg 25 mg BID - Continue lisinopril 10 mg daily.  - Would like to add Spironolactone to HF regimen, but will wait to assess BMET last time renal function and K+ elevated - Will increase hydralazine to 50 mg TID and continue Imdur 30 mg daily. - BMET today with Guide [facility]. - Will need repeat ECHO end of 01/2014  to reassess EF. If remains less than 35% will refer to EP for ICD. QRS narrow no CRT. Had 8 beat run of Vtach in hospital and LifeVest was discussed. He would like to hold off for now. - Reinforced the need and importance of daily weights, a low sodium diet, and fluid restriction (less than 2 L a day). Instructed to call the HF clinic if weight increases more than 3 lbs overnight or 5 lbs in a week.  2) HTN - Stable. As above will increase hydralazine to 50 mg TID. Continue other medications at current doses. 3) Previous ETOH - Patient reports he used to drink 8-9 beers a night. This could have led to his cardiomyopathy and we discussed the need to abstain from ETOH. Continues to abstain.   F/U 2 weeks Jeremy Grant BNP-C 9:21 AM

## 2013-11-26 ENCOUNTER — Encounter (HOSPITAL_COMMUNITY): Payer: Self-pay | Admitting: Anesthesiology

## 2013-11-26 ENCOUNTER — Encounter (HOSPITAL_COMMUNITY): Payer: Self-pay

## 2013-11-26 ENCOUNTER — Encounter: Payer: Self-pay | Admitting: Internal Medicine

## 2013-11-26 ENCOUNTER — Ambulatory Visit (HOSPITAL_COMMUNITY)
Admission: RE | Admit: 2013-11-26 | Discharge: 2013-11-26 | Disposition: A | Discharge status: Home / Self Care | Payer: BC Managed Care – PPO | Source: Ambulatory Visit | Attending: Anesthesiology | Admitting: Anesthesiology

## 2013-11-26 VITALS — BP 148/81 | HR 56 | Resp 18 | Wt 146.2 lb

## 2013-11-26 DIAGNOSIS — I5042 Chronic combined systolic (congestive) and diastolic (congestive) heart failure: Secondary | ICD-10-CM | POA: Insufficient documentation

## 2013-11-26 DIAGNOSIS — I428 Other cardiomyopathies: Secondary | ICD-10-CM

## 2013-11-26 DIAGNOSIS — Z8249 Family history of ischemic heart disease and other diseases of the circulatory system: Secondary | ICD-10-CM | POA: Insufficient documentation

## 2013-11-26 DIAGNOSIS — Z09 Encounter for follow-up examination after completed treatment for conditions other than malignant neoplasm: Secondary | ICD-10-CM | POA: Insufficient documentation

## 2013-11-26 DIAGNOSIS — I5022 Chronic systolic (congestive) heart failure: Secondary | ICD-10-CM

## 2013-11-26 DIAGNOSIS — Z79899 Other long term (current) drug therapy: Secondary | ICD-10-CM | POA: Insufficient documentation

## 2013-11-26 DIAGNOSIS — F1011 Alcohol abuse, in remission: Secondary | ICD-10-CM

## 2013-11-26 DIAGNOSIS — I509 Heart failure, unspecified: Secondary | ICD-10-CM

## 2013-11-26 DIAGNOSIS — I10 Essential (primary) hypertension: Secondary | ICD-10-CM

## 2013-11-26 MED ORDER — FUROSEMIDE 20 MG PO TABS: 20.0000 mg | ORAL_TABLET | ORAL | Status: DC

## 2013-11-26 MED ORDER — HYDRALAZINE HCL 50 MG PO TABS: 50.0000 mg | ORAL_TABLET | Freq: Three times a day (TID) | ORAL | Status: DC

## 2013-11-26 NOTE — Patient Instructions (Signed)
Increase your hydralazine to 50 mg (2 tablets) three times a day. When you run out your new prescription will be 50 mg tablets and then you will take 1 tablet three times a day.  Will change your lasix to 20 mg every other day.  Call any issues.  Will call with lab results and whether we will start spironolactone.  Follow up 3 weeks  Do the following things EVERYDAY: 1) Weigh yourself in the morning before breakfast. Write it down and keep it in a log. 2) Take your medicines as prescribed 3) Eat low salt foods-Limit salt (sodium) to 2000 mg per day.  4) Stay as active as you can everyday 5) Limit all fluids for the day to less than 2 liters 6)

## 2013-12-02 ENCOUNTER — Telehealth (HOSPITAL_COMMUNITY): Payer: Self-pay | Admitting: Vascular Surgery

## 2013-12-02 NOTE — Telephone Encounter (Signed)
Pt wife called she wants results from labs.. Please advise

## 2013-12-03 NOTE — Telephone Encounter (Signed)
I called patient about [facility] labs.I discussed labs with wife and answered her questions.I reminded patient of next appointment on 7/28/15at 8:45am.

## 2013-12-08 ENCOUNTER — Ambulatory Visit (HOSPITAL_COMMUNITY)
Admission: RE | Admit: 2013-12-08 | Discharge: 2013-12-08 | Disposition: A | Discharge status: Home / Self Care | Payer: BC Managed Care – PPO | Source: Ambulatory Visit | Attending: Internal Medicine | Admitting: Internal Medicine

## 2013-12-08 ENCOUNTER — Encounter (HOSPITAL_COMMUNITY): Payer: Self-pay

## 2013-12-08 ENCOUNTER — Encounter: Payer: Self-pay | Admitting: Internal Medicine

## 2013-12-08 VITALS — BP 139/71 | HR 62 | Resp 16 | Wt 146.5 lb

## 2013-12-08 DIAGNOSIS — F1011 Alcohol abuse, in remission: Secondary | ICD-10-CM | POA: Diagnosis not present

## 2013-12-08 DIAGNOSIS — I10 Essential (primary) hypertension: Secondary | ICD-10-CM

## 2013-12-08 DIAGNOSIS — N183 Chronic kidney disease, stage 3 unspecified: Secondary | ICD-10-CM | POA: Insufficient documentation

## 2013-12-08 DIAGNOSIS — I129 Hypertensive chronic kidney disease with stage 1 through stage 4 chronic kidney disease, or unspecified chronic kidney disease: Secondary | ICD-10-CM | POA: Insufficient documentation

## 2013-12-08 DIAGNOSIS — I5042 Chronic combined systolic (congestive) and diastolic (congestive) heart failure: Secondary | ICD-10-CM | POA: Insufficient documentation

## 2013-12-08 DIAGNOSIS — I428 Other cardiomyopathies: Secondary | ICD-10-CM | POA: Diagnosis not present

## 2013-12-08 DIAGNOSIS — I420 Dilated cardiomyopathy: Secondary | ICD-10-CM

## 2013-12-08 DIAGNOSIS — I509 Heart failure, unspecified: Secondary | ICD-10-CM

## 2013-12-08 DIAGNOSIS — Z8249 Family history of ischemic heart disease and other diseases of the circulatory system: Secondary | ICD-10-CM | POA: Insufficient documentation

## 2013-12-08 MED ORDER — FUROSEMIDE 20 MG PO TABS: ORAL_TABLET | ORAL | Status: DC

## 2013-12-08 MED ORDER — SPIRONOLACTONE 25 MG PO TABS: 12.5000 mg | ORAL_TABLET | Freq: Every day | ORAL | Status: DC

## 2013-12-08 NOTE — Addendum Note (Signed)
Encounter addended by: Rande Brunt, NP on: 12/08/2013 10:34 AM<BR>     Documentation filed: Notes Section

## 2013-12-08 NOTE — Patient Instructions (Addendum)
Doing fantastic.   Change lasix to as needed. Take if you feel short or breath or if your weight increases more than 3 lbs in a day or 5 lbs in a week.  Start spironolactone 12.5 mg daily.  Labs next week.  Follow up in 1 month.  Do the following things EVERYDAY: 1) Weigh yourself in the morning before breakfast. Write it down and keep it in a log. 2) Take your medicines as prescribed 3) Eat low salt foods-Limit salt (sodium) to 2000 mg per day.  4) Stay as active as you can everyday 5) Limit all fluids for the day to less than 2 liters 6)

## 2013-12-08 NOTE — Progress Notes (Addendum)
Patient ID: Jeremy Grant, male   DOB: [birth year], 53 y.o.   MRN: [id number]  PCP: Dr. Claris Gower Primary Cardiologist: Dr. Angelena Form  HPI: Jeremy Grant is a 53 yo male with a history of HTN, CKD stage III, NICM and newly diagnosed combined systolic/diastolic heart failure.   Admitted 6/22-6/26/15 with hypertensive urgency. Was treated with medications and underwent L/RHC showing no significant CAD, mild/mod pulmonary HTN and normal filling pressures. D/C weight 149 lbs  ECHOs (11/03/13) EF 20%, grade III DD  Follow up for Heart Failure: Last visit hydralazine increased to 50 mg TID which he tolerated. Doing well. Denies SOB, CP, PND or edema. Walking all day at work and in the yard at home about 2 miles a day. Able to go up hills with no issues. Weight at home 141-143 lbs. Following a low salt diet and drinking less than 2L a day.   Labs:  (11/06/13): K 3.7, creatinine 1.34 (11/12/13): K 5.4, creatinine 1.8, TSH 8.42, free T4 1.28 (11/26/13): K 4.7, creatinine 1.59  SH: Married and lives in [place]. No children. 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 Denies ETOH or tobacco abuse. Previously drank 8-9 beers a day.   FH: Mother living: pre-diabetic        Father living: HTN, CAD, ICD   ROS: All systems negative except as listed in HPI, PMH and Problem List.  Past Medical History  Diagnosis Date  . Hypertension   . Combined systolic and diastolic cardiac dysfunction     Echo 11/03/2013 EF 123456, grade 3 diastolic dysfunction  . NICM (nonischemic cardiomyopathy)     a. L/RHC (11/05/13): RA: 3, RV 52/5, PA 49/19 (31), PCWP 10, AO 166/93, PA 67%, Fick CO/CI: 5.71/2.97, Lmain: normal, LAD: large, without signficant dz, first diagonal has 20% dz at ostium, LCx: normal, RCA: 30% stenosis at the bifurcation of PDA and PLOM    Current Outpatient Prescriptions  Medication Sig Dispense Refill  . carvedilol (COREG) 25 MG tablet Take 1 tablet (25 mg total) by mouth 2 (two) times daily with a meal.  30  tablet  6  . furosemide (LASIX) 20 MG tablet Take 1 tablet (20 mg total) by mouth every other day.  15 tablet  3  . hydrALAZINE (APRESOLINE) 50 MG tablet Take 1 tablet (50 mg total) by mouth 3 (three) times daily.  90 tablet  6  . isosorbide mononitrate (IMDUR) 30 MG 24 hr tablet Take 1 tablet (30 mg total) by mouth every 12 (twelve) hours.  30 tablet  6  . lisinopril (PRINIVIL,ZESTRIL) 10 MG tablet Take 1 tablet (10 mg total) by mouth daily.  30 tablet  6  . naphazoline-pheniramine (NAPHCON-A) 0.025-0.3 % ophthalmic solution Place 1 drop into both eyes 4 (four) times daily as needed for irritation.       No current facility-administered medications for this encounter.    Filed Vitals:   12/08/13 0850  BP: 139/71  Pulse: 62  Resp: 16  Weight: 146 lb 8 oz (66.452 kg)  SpO2: 99%    PHYSICAL EXAM:  General:  Well appearing. No resp difficulty HEENT: normal Neck: supple. JVP flat. Carotids 2+ bilaterally; no bruits. No lymphadenopathy or thryomegaly appreciated. Cor: PMI normal. Regular rate & rhythm. No rubs, gallops or murmurs. Lungs: clear Abdomen: soft, nontender, nondistended. No hepatosplenomegaly. No bruits or masses. Good bowel sounds. Extremities: no cyanosis, clubbing, rash, edema Neuro: alert & orientedx3, cranial nerves grossly intact. Moves all 4 extremities w/o difficulty. Affect pleasant.  ASSESSMENT &  PLAN:  1) Chronic combined systolic/diastolic: NICM, (?HTN or ETOH induced), EF 20%, grade III DD (10/2013) - NYHA I symptoms. He appears a little dry and last creatinine was elevated. Change lasix to PRN. Discussed taking if weight goes up more than 3 lbs in a day or 5 lbs in a week.  - On goal dose of Coreg 25 mg BID - Continue lisinopril 10 mg daily.  - Start Spiro 12.5 mg daily. Will check BMET today and in 7-10 days.  - Continue hydralazine to 50 mg TID and continue Imdur 30 mg daily. - Will need repeat ECHO end of 01/2014 to reassess EF. If remains less than 35%  will refer to EP for ICD. QRS narrow no CRT. Had 8 beat run of Vtach in hospital and LifeVest was discussed. He would like to hold off for now. - Reinforced the need and importance of daily weights, a low sodium diet, and fluid restriction (less than 2 L a day). Instructed to call the HF clinic if weight increases more than 3 lbs overnight or 5 lbs in a week.  2) HTN - Stable. As above will start spironolactone 12.5 mg daily.  3) Previous ETOH - Patient reports he used to drink 8-9 beers a night. This could have led to his cardiomyopathy and we discussed the need to abstain from ETOH. Continues to abstain.   F/U 1 month Jeremy Grant BNP-C 8:56 AM

## 2013-12-09 ENCOUNTER — Encounter (HOSPITAL_COMMUNITY): Payer: BC Managed Care – PPO

## 2013-12-10 ENCOUNTER — Telehealth: Payer: Self-pay | Admitting: *Deleted

## 2013-12-10 NOTE — Telephone Encounter (Signed)
I called patient to let him know results of lab work. Patient will only take Lasix as needed and not every other day. Patient has started on Spironolactone 12.5 mg qd. We will check labs next Friday.

## 2013-12-15 ENCOUNTER — Encounter: Payer: Self-pay | Admitting: Internal Medicine

## 2013-12-17 ENCOUNTER — Encounter (HOSPITAL_COMMUNITY): Payer: BC Managed Care – PPO

## 2013-12-21 ENCOUNTER — Telehealth: Payer: Self-pay | Admitting: *Deleted

## 2013-12-21 NOTE — Telephone Encounter (Signed)
I spoke to wife about patient's lab work on 12/18/2013. Labs were reviewed by [name]. Spironolactone was stopped and patient will be seen by CHF clinic at end of this month. Wife verbalized understanding.

## 2014-01-01 ENCOUNTER — Encounter: Payer: Self-pay | Admitting: Internal Medicine

## 2014-01-05 ENCOUNTER — Encounter (HOSPITAL_COMMUNITY): Payer: Self-pay

## 2014-01-05 ENCOUNTER — Ambulatory Visit (HOSPITAL_COMMUNITY)
Admission: RE | Admit: 2014-01-05 | Discharge: 2014-01-05 | Disposition: A | Discharge status: Home / Self Care | Payer: BC Managed Care – PPO | Source: Ambulatory Visit | Attending: Cardiology | Admitting: Cardiology

## 2014-01-05 VITALS — BP 133/73 | HR 53 | Wt 146.4 lb

## 2014-01-05 DIAGNOSIS — I10 Essential (primary) hypertension: Secondary | ICD-10-CM | POA: Diagnosis not present

## 2014-01-05 DIAGNOSIS — I5022 Chronic systolic (congestive) heart failure: Secondary | ICD-10-CM

## 2014-01-05 DIAGNOSIS — F1011 Alcohol abuse, in remission: Secondary | ICD-10-CM | POA: Insufficient documentation

## 2014-01-05 DIAGNOSIS — N183 Chronic kidney disease, stage 3 unspecified: Secondary | ICD-10-CM | POA: Diagnosis not present

## 2014-01-05 DIAGNOSIS — I509 Heart failure, unspecified: Secondary | ICD-10-CM

## 2014-01-05 DIAGNOSIS — I5042 Chronic combined systolic (congestive) and diastolic (congestive) heart failure: Secondary | ICD-10-CM | POA: Insufficient documentation

## 2014-01-05 DIAGNOSIS — I428 Other cardiomyopathies: Secondary | ICD-10-CM | POA: Diagnosis not present

## 2014-01-05 DIAGNOSIS — I129 Hypertensive chronic kidney disease with stage 1 through stage 4 chronic kidney disease, or unspecified chronic kidney disease: Secondary | ICD-10-CM | POA: Diagnosis not present

## 2014-01-05 NOTE — Patient Instructions (Signed)
Doing great.  Follow up in 1 month ECHO.  Call any issues.  Do the following things EVERYDAY: 1) Weigh yourself in the morning before breakfast. Write it down and keep it in a log. 2) Take your medicines as prescribed 3) Eat low salt foods-Limit salt (sodium) to 2000 mg per day.  4) Stay as active as you can everyday 5) Limit all fluids for the day to less than 2 liters 6)

## 2014-01-05 NOTE — Progress Notes (Addendum)
Patient ID: Jeremy Grant, male   DOB: December 17, 1960, 53 y.o.   MRN: [id number]  PCP: Dr. Claris Gower Primary Cardiologist: Dr. Angelena Form  HPI: Jeremy Grant is a 53 yo male with a history of HTN, CKD stage III, NICM and newly diagnosed combined systolic/diastolic heart failure.   Admitted 6/22-6/26/15 with hypertensive urgency. Was treated with medications and underwent L/RHC showing no significant CAD, mild/mod pulmonary HTN and normal filling pressures. D/C weight 149 lbs  ECHOs (11/03/13) EF 20%, grade III DD  Follow up for Heart Failure: Last visit started Spironolactone 12.5 mg daily, however was discontinued d/t creatinine rise. Feeling good. Denies SOB, orthopnea, PND or edema. Walking about 2 miles a day. Going up hills with no issues and working FT at [employer]. Weight at home 142-143 lbs. Following a low salt diet and drinking less than 2L a day. SBP at home 130s. Pulse 55-70s.   Labs:  (11/06/13): K 3.7, creatinine 1.34 (11/12/13): K 5.4, creatinine 1.8, TSH 8.42, free T4 1.28 (11/26/13): K 4.7, creatinine 1.59 (12/18/13): K 4.7, creatinine 1.65  SH: Married and lives in [place]. No children. 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 Denies ETOH or tobacco abuse. Previously drank 8-9 beers a day.   FH: Mother living: pre-diabetic        Father living: HTN, CAD, ICD   ROS: All systems negative except as listed in HPI, PMH and Problem List.  Past Medical History  Diagnosis Date  . Hypertension   . Combined systolic and diastolic cardiac dysfunction     Echo 11/03/2013 EF 123456, grade 3 diastolic dysfunction  . NICM (nonischemic cardiomyopathy)     a. L/RHC (11/05/13): RA: 3, RV 52/5, PA 49/19 (31), PCWP 10, AO 166/93, PA 67%, Fick CO/CI: 5.71/2.97, Lmain: normal, LAD: large, without signficant dz, first diagonal has 20% dz at ostium, LCx: normal, RCA: 30% stenosis at the bifurcation of PDA and PLOM    Current Outpatient Prescriptions  Medication Sig Dispense Refill  . carvedilol (COREG)  25 MG tablet Take 1 tablet (25 mg total) by mouth 2 (two) times daily with a meal.  30 tablet  6  . furosemide (LASIX) 20 MG tablet Take for weight gain 3lbs day or 5 lbs in a week.  10 tablet  3  . hydrALAZINE (APRESOLINE) 50 MG tablet Take 1 tablet (50 mg total) by mouth 3 (three) times daily.  90 tablet  6  . isosorbide mononitrate (IMDUR) 30 MG 24 hr tablet Take 1 tablet (30 mg total) by mouth every 12 (twelve) hours.  30 tablet  6  . lisinopril (PRINIVIL,ZESTRIL) 10 MG tablet Take 1 tablet (10 mg total) by mouth daily.  30 tablet  6   No current facility-administered medications for this encounter.    Filed Vitals:   01/05/14 0902  BP: 133/73  Pulse: 53  Weight: 146 lb 6 oz (66.395 kg)  SpO2: 99%    PHYSICAL EXAM:  General:  Well appearing. No resp difficulty, wife present HEENT: normal Neck: supple. JVP flat. Carotids 2+ bilaterally; no bruits. No lymphadenopathy or thryomegaly appreciated. Cor: PMI normal. Regular rate & rhythm. No rubs, gallops or murmurs. Lungs: clear Abdomen: soft, nontender, nondistended. No hepatosplenomegaly. No bruits or masses. Good bowel sounds. Extremities: no cyanosis, clubbing, rash, edema Neuro: alert & orientedx3, cranial nerves grossly intact. Moves all 4 extremities w/o difficulty. Affect pleasant.  ASSESSMENT & PLAN:  1) Chronic combined systolic/diastolic: NICM, (?HTN or ETOH induced), EF 20%, grade III DD (10/2013) -  NYHA I symptoms and volume status stable. He has not needed any lasix and will continue PRN dose. - On goal dose of Coreg 25 mg BID. Discussed with patient if he has any fatigue to let me know and we can cut back dose. HR at home 50-60s. - Continue lisinopril 10 mg daily. Will not titrate with renal function.  - Continue hydralazine to 50 mg TID and continue Imdur 30 mg daily. - Repeat ECHO next visit to reassess EF. Patient currently does not qualify for ICD with NYHA I symptoms.  - Reinforced the need and importance of daily  weights, a low sodium diet, and fluid restriction (less than 2 L a day). Instructed to call the HF clinic if weight increases more than 3 lbs overnight or 5 lbs in a week.  2) HTN - Stable, continue current meds.  3) Previous ETOH - Patient reports he used to drink 8-9 beers a night. This could have led to his cardiomyopathy. Continues to abstain.   F/U 1 month with ECHO Jeremy Grant BNP-C 9:04 AM

## 2014-01-06 ENCOUNTER — Other Ambulatory Visit (HOSPITAL_COMMUNITY): Payer: Self-pay

## 2014-01-06 ENCOUNTER — Telehealth (HOSPITAL_COMMUNITY): Payer: Self-pay | Admitting: Anesthesiology

## 2014-01-06 DIAGNOSIS — I151 Hypertension secondary to other renal disorders: Secondary | ICD-10-CM

## 2014-01-06 DIAGNOSIS — N2889 Other specified disorders of kidney and ureter: Secondary | ICD-10-CM

## 2014-01-06 DIAGNOSIS — I159 Secondary hypertension, unspecified: Secondary | ICD-10-CM

## 2014-01-06 NOTE — Telephone Encounter (Signed)
Called patient, reviewed labs.  K+ 5.0 and creatinine 2.05  Creatinine has been trending up. Will stop lisinopril and get renal US duplex to look for renal artery stenosis. Patient is aware.   [name] B NP-C 4:44 PM

## 2014-01-06 NOTE — Addendum Note (Signed)
Addended by: Effie Berkshire on: 01/06/2014 01:59 PM   Modules accepted: Orders

## 2014-01-08 ENCOUNTER — Encounter: Payer: Self-pay | Admitting: Internal Medicine

## 2014-01-08 ENCOUNTER — Ambulatory Visit (HOSPITAL_COMMUNITY): Payer: BC Managed Care – PPO | Attending: Cardiology | Admitting: Cardiology

## 2014-01-08 DIAGNOSIS — I129 Hypertensive chronic kidney disease with stage 1 through stage 4 chronic kidney disease, or unspecified chronic kidney disease: Secondary | ICD-10-CM | POA: Diagnosis not present

## 2014-01-08 DIAGNOSIS — I10 Essential (primary) hypertension: Secondary | ICD-10-CM | POA: Diagnosis present

## 2014-01-08 DIAGNOSIS — N183 Chronic kidney disease, stage 3 unspecified: Secondary | ICD-10-CM | POA: Diagnosis not present

## 2014-01-08 DIAGNOSIS — I159 Secondary hypertension, unspecified: Secondary | ICD-10-CM

## 2014-01-08 NOTE — Progress Notes (Signed)
Renal artery duplex performed  

## 2014-01-12 ENCOUNTER — Ambulatory Visit (HOSPITAL_COMMUNITY)
Admission: RE | Admit: 2014-01-12 | Discharge: 2014-01-12 | Disposition: A | Discharge status: Home / Self Care | Payer: BC Managed Care – PPO | Source: Ambulatory Visit | Attending: Cardiology | Admitting: Cardiology

## 2014-01-12 ENCOUNTER — Telehealth (HOSPITAL_COMMUNITY): Payer: Self-pay | Admitting: Cardiology

## 2014-01-12 DIAGNOSIS — I5042 Chronic combined systolic (congestive) and diastolic (congestive) heart failure: Secondary | ICD-10-CM | POA: Diagnosis present

## 2014-01-12 DIAGNOSIS — I509 Heart failure, unspecified: Secondary | ICD-10-CM | POA: Diagnosis not present

## 2014-01-12 DIAGNOSIS — I5022 Chronic systolic (congestive) heart failure: Secondary | ICD-10-CM

## 2014-01-12 LAB — BASIC METABOLIC PANEL
ANION GAP: 11 (ref 5–15)
BUN: 38 mg/dL — ABNORMAL HIGH (ref 6–23)
CALCIUM: 9 mg/dL (ref 8.4–10.5)
CO2: 27 mEq/L (ref 19–32)
Chloride: 102 mEq/L (ref 96–112)
Creatinine, Ser: 1.9 mg/dL — ABNORMAL HIGH (ref 0.50–1.35)
GFR calc Af Amer: 45 mL/min — ABNORMAL LOW (ref >90)
GFR, EST NON AFRICAN AMERICAN: 38 mL/min — AB (ref >90)
Glucose, Bld: 135 mg/dL — ABNORMAL HIGH (ref 70–99)
Potassium: 4.5 mEq/L (ref 3.7–5.3)
SODIUM: 140 meq/L (ref 137–147)

## 2014-01-12 NOTE — Telephone Encounter (Signed)
Pt scheduled for RHC on 01/14/2014 Cpt [id number] icd 9 428.22 With pts current insurance- [employer] No pre cert req'd

## 2014-01-14 ENCOUNTER — Encounter (HOSPITAL_COMMUNITY)
Admission: RE | Disposition: A | Discharge status: Home / Self Care | Payer: Self-pay | Source: Ambulatory Visit | Attending: Cardiology

## 2014-01-14 ENCOUNTER — Ambulatory Visit (HOSPITAL_COMMUNITY)
Admission: RE | Admit: 2014-01-14 | Discharge: 2014-01-14 | Disposition: A | Discharge status: Home / Self Care | Payer: BC Managed Care – PPO | Source: Ambulatory Visit | Attending: Cardiology | Admitting: Cardiology

## 2014-01-14 DIAGNOSIS — R944 Abnormal results of kidney function studies: Secondary | ICD-10-CM | POA: Diagnosis present

## 2014-01-14 DIAGNOSIS — I509 Heart failure, unspecified: Secondary | ICD-10-CM | POA: Diagnosis not present

## 2014-01-14 DIAGNOSIS — I5022 Chronic systolic (congestive) heart failure: Secondary | ICD-10-CM

## 2014-01-14 HISTORY — PX: RIGHT HEART CATHETERIZATION: SHX5447

## 2014-01-14 LAB — BASIC METABOLIC PANEL
ANION GAP: 11 (ref 5–15)
BUN: 39 mg/dL — ABNORMAL HIGH (ref 6–23)
CALCIUM: 8.9 mg/dL (ref 8.4–10.5)
CO2: 26 mEq/L (ref 19–32)
Chloride: 107 mEq/L (ref 96–112)
Creatinine, Ser: 1.7 mg/dL — ABNORMAL HIGH (ref 0.50–1.35)
GFR calc non Af Amer: 44 mL/min — ABNORMAL LOW (ref >90)
GFR, EST AFRICAN AMERICAN: 51 mL/min — AB (ref >90)
Glucose, Bld: 118 mg/dL — ABNORMAL HIGH (ref 70–99)
Potassium: 4.5 mEq/L (ref 3.7–5.3)
SODIUM: 144 meq/L (ref 137–147)

## 2014-01-14 LAB — POCT I-STAT 3, VENOUS BLOOD GAS (G3P V)
Bicarbonate: 25.3 mEq/L — ABNORMAL HIGH (ref 20.0–24.0)
O2 SAT: 75 %
TCO2: 27 mmol/L (ref 0–100)
pCO2, Ven: 43.8 mmHg — ABNORMAL LOW (ref 45.0–50.0)
pH, Ven: 7.369 — ABNORMAL HIGH (ref 7.250–7.300)
pO2, Ven: 41 mmHg (ref 30.0–45.0)

## 2014-01-14 LAB — CBC
HCT: 31.5 % — ABNORMAL LOW (ref 39.0–52.0)
Hemoglobin: 11.2 g/dL — ABNORMAL LOW (ref 13.0–17.0)
MCH: 30.6 pg (ref 26.0–34.0)
MCHC: 35.6 g/dL (ref 30.0–36.0)
MCV: 86.1 fL (ref 78.0–100.0)
PLATELETS: 116 10*3/uL — AB (ref 150–400)
RBC: 3.66 MIL/uL — ABNORMAL LOW (ref 4.22–5.81)
RDW: 12.5 % (ref 11.5–15.5)
WBC: 3.6 10*3/uL — ABNORMAL LOW (ref 4.0–10.5)

## 2014-01-14 LAB — PRO B NATRIURETIC PEPTIDE: Pro B Natriuretic peptide (BNP): 251.9 pg/mL — ABNORMAL HIGH (ref 0–125)

## 2014-01-14 LAB — PROTIME-INR
INR: 1.07 (ref 0.00–1.49)
PROTHROMBIN TIME: 13.9 s (ref 11.6–15.2)

## 2014-01-14 SURGERY — RIGHT HEART CATH
Anesthesia: LOCAL

## 2014-01-14 MED ORDER — SODIUM CHLORIDE 0.9 % IJ SOLN: 3.0000 mL | Freq: Two times a day (BID) | INTRAMUSCULAR | Status: DC

## 2014-01-14 MED ORDER — HEPARIN (PORCINE) IN NACL 2-0.9 UNIT/ML-% IJ SOLN
INTRAMUSCULAR | Status: AC
  Filled 2014-01-14: qty 1000

## 2014-01-14 MED ORDER — ACETAMINOPHEN 325 MG PO TABS: 650.0000 mg | ORAL_TABLET | ORAL | Status: DC | PRN

## 2014-01-14 MED ORDER — MIDAZOLAM HCL 2 MG/2ML IJ SOLN
INTRAMUSCULAR | Status: AC
  Filled 2014-01-14: qty 2

## 2014-01-14 MED ORDER — FENTANYL CITRATE 0.05 MG/ML IJ SOLN
INTRAMUSCULAR | Status: AC
  Filled 2014-01-14: qty 2

## 2014-01-14 MED ORDER — LIDOCAINE HCL (PF) 1 % IJ SOLN
INTRAMUSCULAR | Status: AC
  Filled 2014-01-14: qty 30

## 2014-01-14 MED ORDER — NITROGLYCERIN 1 MG/10 ML FOR IR/CATH LAB
INTRA_ARTERIAL | Status: AC
  Filled 2014-01-14: qty 10

## 2014-01-14 MED ORDER — SODIUM CHLORIDE 0.9 % IV SOLN: 250.0000 mL | INTRAVENOUS | Status: DC | PRN

## 2014-01-14 MED ORDER — SODIUM CHLORIDE 0.9 % IV SOLN: INTRAVENOUS | Status: AC

## 2014-01-14 MED ORDER — SODIUM CHLORIDE 0.9 % IV SOLN: INTRAVENOUS | Status: DC

## 2014-01-14 MED ORDER — ONDANSETRON HCL 4 MG/2ML IJ SOLN: 4.0000 mg | Freq: Four times a day (QID) | INTRAMUSCULAR | Status: DC | PRN

## 2014-01-14 MED ORDER — SODIUM CHLORIDE 0.9 % IJ SOLN: 3.0000 mL | INTRAMUSCULAR | Status: DC | PRN

## 2014-01-14 NOTE — Progress Notes (Signed)
Dr Aundra Dubin was paged, need orders for discharge time. Per Dr Aundra Dubin he can be discharged in two hours post procedure.

## 2014-01-14 NOTE — Progress Notes (Signed)
Site area: rt brachial venous sheath Site Prior to Removal:  Level 0 Pressure Applied For: 15 minutes Manual:   yes Patient Status During Pull:  stable Post Pull Site:  Level 0 Post Pull Instructions Given:  yes Post Pull Pulses Present: rt radial palpable Dressing Applied:  tegaderm Bedrest begins @ [id number] Comments: no complications

## 2014-01-14 NOTE — Discharge Instructions (Signed)
Wound Care Wound care helps prevent pain and infection.   HOME CARE   Only take medicine as told by your doctor.  Change any bandages (dressings) as told by your doctor.  Change the bandage if it gets wet,   Take showers. Do not take baths, swim, or do anything that puts your wound under water.  Rest and raise (elevate) the wound until the pain and puffiness (swelling) are better.  Keep all doctor visits as told. GET HELP RIGHT AWAY IF:   Yellowish-white fluid (pus) comes from the wound.  Medicine does not lessen your pain.  There is a red streak going away from the wound.  You have a fever. MAKE SURE YOU:   Understand these instructions.  Will watch your condition.  Will get help right away if you are not doing well or get worse. Document Released: 02/07/2008 Document Revised: 07/23/2011 Document Reviewed: 09/03/2010 [facility] Patient Information 2015 [place], [place]. This information is not intended to replace advice given to you by your health care provider. Make sure you discuss any questions you have with your health care provider.

## 2014-01-14 NOTE — CV Procedure (Signed)
    Cardiac Catheterization Procedure Note  Name: Jeremy Grant MRN: [id number] DOB: 03-26-1961  Procedure: Right Heart Cath  Indication: Rising creatinine, ?low output.    Procedural Details: The right brachial area was sterilely prepped and draped.  There was a pre-existing peripheral IV.  This was replaced with a 1F venous sheath. A Swan-Ganz catheter was used for the right heart catheterization. Standard protocol was followed for recording of right heart pressures and sampling of oxygen saturations. Fick cardiac output was calculated. There were no immediate procedural complications. The patient was transferred to the post catheterization recovery area for further monitoring.  Of note, the venous system was difficult to navigate and we used about 10 cc of contrast for venography.   Procedural Findings: Hemodynamics (mmHg) RA mean 4 RV 37/6 PA 35/10, mean 20 PCWP mean 8  Oxygen saturations: PA 75% AO 99%  Cardiac Output (Fick) 6.77  Cardiac Index (Fick) 3.64   Final Conclusions:  Low to normal right and left heart filling pressures, borderline elevated PA pressure.  Good cardiac output.  Suspect rise in creatinine may be prerenal (dry + ACEI use).  No Lasix.  Would cut back on lisinopril to 5 mg daily.   Loralie Champagne 01/14/2014, 12:30 PM

## 2014-02-05 ENCOUNTER — Ambulatory Visit (HOSPITAL_BASED_OUTPATIENT_CLINIC_OR_DEPARTMENT_OTHER)
Admission: RE | Admit: 2014-02-05 | Discharge: 2014-02-05 | Disposition: A | Discharge status: Home / Self Care | Payer: BC Managed Care – PPO | Source: Ambulatory Visit | Attending: Cardiology | Admitting: Cardiology

## 2014-02-05 ENCOUNTER — Encounter (HOSPITAL_COMMUNITY): Payer: Self-pay

## 2014-02-05 ENCOUNTER — Ambulatory Visit (HOSPITAL_COMMUNITY)
Admission: RE | Admit: 2014-02-05 | Discharge: 2014-02-05 | Disposition: A | Discharge status: Home / Self Care | Payer: BC Managed Care – PPO | Source: Ambulatory Visit | Attending: Cardiology | Admitting: Cardiology

## 2014-02-05 VITALS — BP 168/98 | HR 54 | Ht 73.0 in | Wt 144.8 lb

## 2014-02-05 DIAGNOSIS — N179 Acute kidney failure, unspecified: Secondary | ICD-10-CM | POA: Diagnosis not present

## 2014-02-05 DIAGNOSIS — I428 Other cardiomyopathies: Secondary | ICD-10-CM | POA: Insufficient documentation

## 2014-02-05 DIAGNOSIS — Z8249 Family history of ischemic heart disease and other diseases of the circulatory system: Secondary | ICD-10-CM | POA: Diagnosis not present

## 2014-02-05 DIAGNOSIS — F1011 Alcohol abuse, in remission: Secondary | ICD-10-CM | POA: Insufficient documentation

## 2014-02-05 DIAGNOSIS — I509 Heart failure, unspecified: Secondary | ICD-10-CM | POA: Diagnosis not present

## 2014-02-05 DIAGNOSIS — I129 Hypertensive chronic kidney disease with stage 1 through stage 4 chronic kidney disease, or unspecified chronic kidney disease: Secondary | ICD-10-CM | POA: Diagnosis not present

## 2014-02-05 DIAGNOSIS — I5022 Chronic systolic (congestive) heart failure: Secondary | ICD-10-CM | POA: Diagnosis present

## 2014-02-05 DIAGNOSIS — N183 Chronic kidney disease, stage 3 unspecified: Secondary | ICD-10-CM | POA: Insufficient documentation

## 2014-02-05 DIAGNOSIS — I10 Essential (primary) hypertension: Secondary | ICD-10-CM

## 2014-02-05 MED ORDER — HYDRALAZINE HCL 50 MG PO TABS: 75.0000 mg | ORAL_TABLET | Freq: Three times a day (TID) | ORAL | Status: DC

## 2014-02-05 NOTE — Progress Notes (Signed)
Patient ID: Jeremy Grant, male   DOB: 10-31-1960, 53 y.o.   MRN: [id number] PCP: Dr. Claris Gower  HPI: Jeremy Grant is a 53 yo male with a history of HTN, CKD stage III, and nonischemic cardiomyopathy.   Admitted 6/22-6/26/15 with hypertensive urgency. Was treated with medications and underwent L/RHC showing no significant CAD, mild/mod pulmonary HTN and normal filling pressures. D/C weight 149 lbs  ECHOs (11/03/13) EF 20%, grade III DD (9/15) EF [id number], grade I diastolic dysfunction, normal RV size and systolic function  RHC (99991111) with mean RA 4, PA 35/10 (mean 20), mean PCWP 8, CI 3.64.   Lisinopril has been stopped due to elevated creatinine.   Patient is no longer drinking ETOH.  He is taking his meds but BP still running a bit high.  Not normally as high as it is today.  Typically SBP runs 130s-140s.  No exertional dyspnea.  No orthopnea or PND. No chest pain.  No lightheadedness. No palpitations.   Labs:  (11/06/13): K 3.7, creatinine 1.34 (11/12/13): K 5.4, creatinine 1.8, TSH 8.42, free T4 1.28 (11/26/13): K 4.7, creatinine 1.59 (12/18/13): K 4.7, creatinine 1.65 (9/15): K 4.5, creatinine 1.7  SH: Married and lives in [place]. No children. 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 Not currently drinking or smoking. Previously drank 8-9 beers a day.   FH: Mother living: pre-diabetic        Father living: HTN, CAD, ICD   ROS: All systems negative except as listed in HPI, PMH and Problem List.  Past Medical History  Diagnosis Date  . Hypertension   . Combined systolic and diastolic cardiac dysfunction     Echo 11/03/2013 EF 123456, grade 3 diastolic dysfunction  . NICM (nonischemic cardiomyopathy)     a. L/RHC (11/05/13): RA: 3, RV 52/5, PA 49/19 (31), PCWP 10, AO 166/93, PA 67%, Fick CO/CI: 5.71/2.97, Lmain: normal, LAD: large, without signficant dz, first diagonal has 20% dz at ostium, LCx: normal, RCA: 30% stenosis at the bifurcation of PDA and PLOM    Current Outpatient  Prescriptions  Medication Sig Dispense Refill  . carvedilol (COREG) 25 MG tablet Take 1 tablet (25 mg total) by mouth 2 (two) times daily with a meal.  30 tablet  6  . furosemide (LASIX) 20 MG tablet Take for weight gain 3lbs day or 5 lbs in a week.  10 tablet  3  . hydrALAZINE (APRESOLINE) 50 MG tablet Take 1.5 tablets (75 mg total) by mouth 3 (three) times daily.  135 tablet  6  . isosorbide mononitrate (IMDUR) 30 MG 24 hr tablet Take 1 tablet (30 mg total) by mouth every 12 (twelve) hours.  30 tablet  6   No current facility-administered medications for this encounter.    Filed Vitals:   02/05/14 0947  BP: 168/98  Pulse: 54  Height: 6\' 1"  (1.854 m)  Weight: 144 lb 12.8 oz (65.681 kg)  SpO2: 98%    PHYSICAL EXAM:  General:  Well appearing. No resp difficulty, wife present HEENT: normal Neck: supple. JVP flat. Carotids 2+ bilaterally; no bruits. No lymphadenopathy or thryomegaly appreciated. Cor: PMI normal. Regular rate & rhythm. No rubs, gallops or murmurs. Lungs: clear Abdomen: soft, nontender, nondistended. No hepatosplenomegaly. No bruits or masses. Good bowel sounds. Extremities: no cyanosis, clubbing, rash, edema Neuro: alert & orientedx3, cranial nerves grossly intact. Moves all 4 extremities w/o difficulty. Affect pleasant.  ASSESSMENT & PLAN:  1) Chronic combined systolic/diastolic: Nonischemic cardiomyopathy, EF 20% on prior echo now up  to 55%.  Possibly due to a combination of uncontrolled HTN and ETOH. He is no longer drinking. NYHA I symptoms and volume status stable.  Recent RHC showed normal cardiac output and normal filling pressures. No ICD given recovery of function.  - Continue current Coreg at 25 mg bid. - Increase hydralazine to 75 mg tid to help with BP control and continue Imdur.  - No ACEI with elevated creatinine.  - Euvolemic, he does not need Lasix.  2) HTN: BP still high, increasing hydralazine as above.  He will check BP daily and we'll call him in  2 wks to see what his number run at home.  3) Previous ETOH abuse: Possible contributor to his cardiomyopathy.  I encouraged him to stay off ETOH.  4) AKI: Repeat BMET in 2-3 weeks off lisinopril and Lasix.   Loralie Champagne 02/05/2014

## 2014-02-05 NOTE — Progress Notes (Signed)
Echo Lab  2D Echocardiogram completed.  Jeremy Grant, RDCS 02/05/2014 9:35 AM

## 2014-02-05 NOTE — Patient Instructions (Signed)
Increase Hydralazine to 75 mg (1 & 1/2 tabs) Three times a day   Labs in 2 weeks  Your physician has requested that you regularly monitor and record your blood pressure readings at home. Please use the same machine at the same time of day to check your readings and record them, give [place] a call in 2 weeks with how it's been running  Your physician recommends that you schedule a follow-up appointment in: 3 months

## 2014-02-11 ENCOUNTER — Telehealth: Payer: Self-pay | Admitting: *Deleted

## 2014-02-11 NOTE — Telephone Encounter (Signed)
Patient was notified of lab results. Bun was 33 and creatinine 1.44. Labs have improved from bun 52 and creatinine 2.05. Patient verbalized understanding.We will see patient on 02/23/14 for next visit.

## 2014-02-15 ENCOUNTER — Other Ambulatory Visit (HOSPITAL_COMMUNITY): Payer: Self-pay | Admitting: Physician Assistant

## 2014-02-15 ENCOUNTER — Telehealth (HOSPITAL_COMMUNITY): Payer: Self-pay | Admitting: Anesthesiology

## 2014-02-15 MED ORDER — CARVEDILOL 25 MG PO TABS: 25.0000 mg | ORAL_TABLET | Freq: Two times a day (BID) | ORAL | Status: DC

## 2014-02-15 MED ORDER — ISOSORBIDE MONONITRATE ER 30 MG PO TB24: 30.0000 mg | ORAL_TABLET | Freq: Two times a day (BID) | ORAL | Status: DC

## 2014-02-15 NOTE — Telephone Encounter (Signed)
Called needing refills.  Jeremy Grant; 12:03 PM

## 2014-02-23 ENCOUNTER — Ambulatory Visit (HOSPITAL_COMMUNITY)
Admission: RE | Admit: 2014-02-23 | Discharge: 2014-02-23 | Disposition: A | Discharge status: Home / Self Care | Payer: BC Managed Care – PPO | Source: Ambulatory Visit | Attending: Internal Medicine | Admitting: Internal Medicine

## 2014-02-23 VITALS — BP 154/88 | HR 54 | Wt 144.0 lb

## 2014-02-23 DIAGNOSIS — N179 Acute kidney failure, unspecified: Secondary | ICD-10-CM | POA: Insufficient documentation

## 2014-02-23 DIAGNOSIS — I428 Other cardiomyopathies: Secondary | ICD-10-CM

## 2014-02-23 DIAGNOSIS — I129 Hypertensive chronic kidney disease with stage 1 through stage 4 chronic kidney disease, or unspecified chronic kidney disease: Secondary | ICD-10-CM | POA: Diagnosis not present

## 2014-02-23 DIAGNOSIS — I10 Essential (primary) hypertension: Secondary | ICD-10-CM

## 2014-02-23 DIAGNOSIS — I429 Cardiomyopathy, unspecified: Secondary | ICD-10-CM | POA: Diagnosis not present

## 2014-02-23 DIAGNOSIS — I5042 Chronic combined systolic (congestive) and diastolic (congestive) heart failure: Secondary | ICD-10-CM | POA: Diagnosis present

## 2014-02-23 DIAGNOSIS — N183 Chronic kidney disease, stage 3 (moderate): Secondary | ICD-10-CM | POA: Diagnosis not present

## 2014-02-23 MED ORDER — HYDRALAZINE HCL 100 MG PO TABS: 100.0000 mg | ORAL_TABLET | Freq: Three times a day (TID) | ORAL | Status: DC

## 2014-02-23 NOTE — Patient Instructions (Signed)
Increase Hydralazine to 100 mg Three times a day   Lab today  We will contact you in 6 months to schedule your next appointment.

## 2014-02-24 NOTE — Progress Notes (Signed)
Patient ID: Jeremy Grant, male   DOB: February 26, 1961, 53 y.o.   MRN: [id number] PCP: Dr. Claris Gower  HPI: [name] is a 53 yo male with a history of HTN, CKD stage III, and nonischemic cardiomyopathy.   Admitted 6/22-6/26/15 with hypertensive urgency. Was treated with medications and underwent L/RHC showing no significant CAD, mild/mod pulmonary HTN and normal filling pressures. D/C weight 149 lbs  ECHOs (11/03/13) EF 20%, grade III DD (9/15) EF [id number], grade I diastolic dysfunction, normal RV size and systolic function  RHC (99991111) with mean RA 4, PA 35/10 (mean 20), mean PCWP 8, CI 3.64.   Lisinopril has been stopped due to elevated creatinine.   Patient is no longer drinking ETOH.  He is taking his meds but BP still running a bit high.  Typically SBP runs 130s-150s.  No exertional dyspnea.  No orthopnea or PND. No chest pain.  No lightheadedness. No palpitations.   Labs:  (11/06/13): K 3.7, creatinine 1.34 (11/12/13): K 5.4, creatinine 1.8, TSH 8.42, free T4 1.28 (11/26/13): K 4.7, creatinine 1.59 (12/18/13): K 4.7, creatinine 1.65 (9/15): K 4.5, creatinine 1.7  SH: Married and lives in [place]. No children. 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 Not currently drinking or smoking. Previously drank 8-9 beers a day.   FH: Mother living: pre-diabetic        Father living: HTN, CAD, ICD   ROS: All systems negative except as listed in HPI, PMH and Problem List.  Past Medical History  Diagnosis Date  . Hypertension   . Combined systolic and diastolic cardiac dysfunction     Echo 11/03/2013 EF 123456, grade 3 diastolic dysfunction  . NICM (nonischemic cardiomyopathy)     a. L/RHC (11/05/13): RA: 3, RV 52/5, PA 49/19 (31), PCWP 10, AO 166/93, PA 67%, Fick CO/CI: 5.71/2.97, Lmain: normal, LAD: large, without signficant dz, first diagonal has 20% dz at ostium, LCx: normal, RCA: 30% stenosis at the bifurcation of PDA and PLOM    Current Outpatient Prescriptions  Medication Sig Dispense Refill  .  carvedilol (COREG) 25 MG tablet Take 1 tablet (25 mg total) by mouth 2 (two) times daily with a meal.  60 tablet  6  . furosemide (LASIX) 20 MG tablet Take for weight gain 3lbs day or 5 lbs in a week.  10 tablet  3  . hydrALAZINE (APRESOLINE) 100 MG tablet Take 1 tablet (100 mg total) by mouth 3 (three) times daily.  90 tablet  6  . isosorbide mononitrate (IMDUR) 30 MG 24 hr tablet Take 1 tablet (30 mg total) by mouth 2 (two) times daily.  60 tablet  6   No current facility-administered medications for this encounter.    Filed Vitals:   02/23/14 1125  BP: 154/88  Pulse: 54  Weight: 144 lb (65.318 kg)  SpO2: 100%    PHYSICAL EXAM:  General:  Well appearing. No resp difficulty, wife present HEENT: normal Neck: supple. JVP flat. Carotids 2+ bilaterally; no bruits. No lymphadenopathy or thryomegaly appreciated. Cor: PMI normal. Regular rate & rhythm. No rubs, gallops or murmurs. Lungs: clear Abdomen: soft, nontender, nondistended. No hepatosplenomegaly. No bruits or masses. Good bowel sounds. Extremities: no cyanosis, clubbing, rash, edema Neuro: alert & orientedx3, cranial nerves grossly intact. Moves all 4 extremities w/o difficulty. Affect pleasant.  ASSESSMENT & PLAN:  1) Chronic combined systolic/diastolic: Nonischemic cardiomyopathy, EF 20% on prior echo now up to 55%.  Possibly due to a combination of uncontrolled HTN and ETOH. He is no longer  drinking. NYHA I symptoms and volume status stable.  Recent RHC showed normal cardiac output and normal filling pressures. No ICD given recovery of function.  - Continue current Coreg at 25 mg bid. - Increase hydralazine to 100 mg tid to help with BP control and continue Imdur.  - No ACEI with elevated creatinine.  - Euvolemic, he does not need Lasix.  2) HTN: BP still high, increasing hydralazine as above.   3) Previous ETOH abuse: Possible contributor to his cardiomyopathy.  I encouraged him to stay off ETOH.  4) AKI: BMET today.    Followup in 6 months  Loralie Champagne 02/24/2014

## 2014-03-16 ENCOUNTER — Encounter: Payer: Self-pay | Admitting: Internal Medicine

## 2014-03-19 ENCOUNTER — Encounter: Payer: Self-pay | Admitting: Internal Medicine

## 2014-04-22 ENCOUNTER — Encounter (HOSPITAL_COMMUNITY): Payer: Self-pay | Admitting: Cardiology

## 2014-05-20 ENCOUNTER — Telehealth (HOSPITAL_COMMUNITY): Payer: Self-pay | Admitting: *Deleted

## 2014-05-20 ENCOUNTER — Telehealth: Payer: Self-pay | Admitting: *Deleted

## 2014-05-20 MED ORDER — AMLODIPINE BESYLATE 5 MG PO TABS: 5.0000 mg | ORAL_TABLET | Freq: Every day | ORAL | Status: DC

## 2014-05-20 NOTE — Telephone Encounter (Signed)
Pt in for research study visit and had elevated BP, it was discussed w/Dr Aundra Dubin who recommends starting pt on Amlodipine 5 mg daily, pt and wife notified, rx sent to pharmacy

## 2014-05-20 NOTE — Telephone Encounter (Signed)
I saw patient for 45-month Guide It Study visit. We checked patient's blood pressure 3 times. B/p was 192/84, 187/93, and 180/90. I spoke to Dr. Aundra Dubin. He added Amlodipine 5 mg qd. We will follow-up with labs in 2 weeks.Medication will be called to [place].I spoke to wife and she will pick up new medication at pharmacy.

## 2014-05-21 ENCOUNTER — Telehealth: Payer: Self-pay | Admitting: *Deleted

## 2014-05-21 NOTE — Telephone Encounter (Signed)
I called patient to let them know lab work is stable. Dr. Billee Cashing, [place] reviewed labs. BUN was 35 and Creatinine was 1.86. No changes were made to medications. Patient did start on amlodipine 5mg  qd . Patient stated felt okay after the first dose. We will see patient in 2 weeks for BMP lab check.

## 2014-07-12 ENCOUNTER — Encounter: Payer: Self-pay | Admitting: Internal Medicine

## 2014-08-19 ENCOUNTER — Ambulatory Visit (HOSPITAL_COMMUNITY)
Admission: RE | Admit: 2014-08-19 | Discharge: 2014-08-19 | Disposition: A | Discharge status: Home / Self Care | Payer: BLUE CROSS/BLUE SHIELD | Source: Ambulatory Visit | Attending: Cardiology | Admitting: Cardiology

## 2014-08-19 VITALS — BP 166/78 | HR 64 | Wt 151.8 lb

## 2014-08-19 DIAGNOSIS — I428 Other cardiomyopathies: Secondary | ICD-10-CM

## 2014-08-19 DIAGNOSIS — Z79899 Other long term (current) drug therapy: Secondary | ICD-10-CM | POA: Diagnosis not present

## 2014-08-19 DIAGNOSIS — I129 Hypertensive chronic kidney disease with stage 1 through stage 4 chronic kidney disease, or unspecified chronic kidney disease: Secondary | ICD-10-CM | POA: Insufficient documentation

## 2014-08-19 DIAGNOSIS — I10 Essential (primary) hypertension: Secondary | ICD-10-CM | POA: Diagnosis not present

## 2014-08-19 DIAGNOSIS — I429 Cardiomyopathy, unspecified: Secondary | ICD-10-CM

## 2014-08-19 DIAGNOSIS — N183 Chronic kidney disease, stage 3 (moderate): Secondary | ICD-10-CM | POA: Diagnosis not present

## 2014-08-19 MED ORDER — AMLODIPINE BESYLATE 10 MG PO TABS: 10.0000 mg | ORAL_TABLET | Freq: Every day | ORAL | Status: DC

## 2014-08-19 NOTE — Patient Instructions (Signed)
Increase Amlodipine to 10 mg daily, we have sent in a new prescription for 10 mg tablets  Call us in 2 weeks with your BP readings  We will contact you in 3 months to schedule your next appointment.

## 2014-08-19 NOTE — Progress Notes (Signed)
Patient ID: Jeremy Grant, male   DOB: July 31, 1960, 54 y.o.   MRN: [id number] PCP: Dr. Claris Grant  HPI: Jeremy Grant is a 54 yo male with a history of HTN, CKD stage III, and nonischemic cardiomyopathy.   Admitted 6/22-6/26/15 with hypertensive urgency. Was treated with medications and underwent L/RHC showing no significant CAD, mild/mod pulmonary HTN and normal filling pressures. D/C weight 149 lbs  ECHOs (11/03/13) EF 20%, grade III DD (9/15) EF [id number], grade I diastolic dysfunction, normal RV size and systolic function  RHC (99991111) with mean RA 4, PA 35/10 (mean 20), mean PCWP 8, CI 3.64.   Renal artery dopplers (8/15) with no significant stenosis.   Lisinopril has been stopped due to elevated creatinine.   Patient is no longer drinking ETOH.  He is taking his meds but BP still running a bit high.  Typically SBP runs 150s-160s.  No exertional dyspnea.  No orthopnea or PND. No chest pain.  No lightheadedness. No palpitations.   Labs:  (11/06/13): K 3.7, creatinine 1.34 (11/12/13): K 5.4, creatinine 1.8, TSH 8.42, free T4 1.28 (11/26/13): K 4.7, creatinine 1.59 (12/18/13): K 4.7, creatinine 1.65 (9/15): K 4.5, creatinine 1.7 (1/16): K 4.4, creatinine 1.44, pro-BNP 118  SH: Married and lives in [place]. No children. 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 Not currently drinking or smoking. Previously drank 8-9 beers a day.   FH: Mother living: pre-diabetic        Father living: HTN, CAD, ICD  ROS: All systems negative except as listed in HPI, PMH and Problem List.  Past Medical History  Diagnosis Date  . Hypertension   . Combined systolic and diastolic cardiac dysfunction     Echo 11/03/2013 EF 123456, grade 3 diastolic dysfunction  . NICM (nonischemic cardiomyopathy)     a. L/RHC (11/05/13): RA: 3, RV 52/5, PA 49/19 (31), PCWP 10, AO 166/93, PA 67%, Fick CO/CI: 5.71/2.97, Lmain: normal, LAD: large, without signficant dz, first diagonal has 20% dz at ostium, LCx: normal, RCA: 30% stenosis at  the bifurcation of PDA and PLOM    Current Outpatient Prescriptions  Medication Sig Dispense Refill  . amLODipine (NORVASC) 10 MG tablet Take 1 tablet (10 mg total) by mouth daily. 30 tablet 3  . carvedilol (COREG) 25 MG tablet Take 1 tablet (25 mg total) by mouth 2 (two) times daily with a meal. 60 tablet 6  . furosemide (LASIX) 20 MG tablet Take for weight gain 3lbs day or 5 lbs in a week. 10 tablet 3  . hydrALAZINE (APRESOLINE) 100 MG tablet Take 1 tablet (100 mg total) by mouth 3 (three) times daily. 90 tablet 6  . isosorbide mononitrate (IMDUR) 30 MG 24 hr tablet Take 1 tablet (30 mg total) by mouth 2 (two) times daily. 60 tablet 6   No current facility-administered medications for this encounter.    Filed Vitals:   08/19/14 0929  BP: 166/78  Pulse: 64  Weight: 151 lb 12 oz (68.833 kg)  SpO2: 96%    PHYSICAL EXAM:  General:  Well appearing. No resp difficulty.  HEENT: normal Neck: supple. JVP flat. Carotids 2+ bilaterally; no bruits. No lymphadenopathy or thryomegaly appreciated. Cor: PMI normal. Regular rate & rhythm. No rubs, gallops or murmurs. Lungs: clear Abdomen: soft, nontender, nondistended. No hepatosplenomegaly. No bruits or masses. Good bowel sounds. Extremities: no cyanosis, clubbing, rash, edema Neuro: alert & orientedx3, cranial nerves grossly intact. Moves all 4 extremities w/o difficulty. Affect pleasant.  ASSESSMENT & PLAN:  1) Chronic  combined systolic/diastolic: Nonischemic cardiomyopathy, EF 20% on prior echo now up to 55%.  Possibly due to a combination of uncontrolled HTN and ETOH. He is no longer drinking. NYHA I symptoms and volume status stable.  RHC showed normal cardiac output and normal filling pressures. No ICD given recovery of function.  - Continue current Coreg at 25 mg bid. - Continue hydralazine/Imdur.  - No ACEI with elevated creatinine.  - Euvolemic, he does not need Lasix.  - Repeat echo in 9/16 to make sure that EF remains normal.   2) HTN: Renal artery dopplers not suggestive of renal artery stenosis.  BP still high, continue Coreg, hydralazine.  I will increase amlodipine to 10 mg daily.  Now that creatinine is improved, can use spironolactone as next step. He will continue to check BP daily and write down readings.  We will call him in 2 wks to see what his numbers are running.   3) Previous ETOH abuse: Possible contributor to his cardiomyopathy.  I encouraged him to stay off ETOH.  4) CKD: BMET today.    Loralie Champagne 08/19/2014

## 2014-09-20 ENCOUNTER — Other Ambulatory Visit (HOSPITAL_COMMUNITY): Payer: Self-pay | Admitting: Cardiology

## 2014-09-20 ENCOUNTER — Other Ambulatory Visit (HOSPITAL_COMMUNITY): Payer: Self-pay | Admitting: Anesthesiology

## 2014-10-19 ENCOUNTER — Other Ambulatory Visit (HOSPITAL_COMMUNITY): Payer: Self-pay | Admitting: *Deleted

## 2014-10-19 MED ORDER — HYDRALAZINE HCL 100 MG PO TABS: 100.0000 mg | ORAL_TABLET | Freq: Three times a day (TID) | ORAL | Status: DC

## 2014-10-20 ENCOUNTER — Other Ambulatory Visit (HOSPITAL_COMMUNITY): Payer: Self-pay | Admitting: *Deleted

## 2014-10-20 MED ORDER — HYDRALAZINE HCL 100 MG PO TABS: 100.0000 mg | ORAL_TABLET | Freq: Three times a day (TID) | ORAL | Status: DC

## 2014-11-17 ENCOUNTER — Other Ambulatory Visit (HOSPITAL_COMMUNITY): Payer: Self-pay

## 2014-11-17 MED ORDER — AMLODIPINE BESYLATE 10 MG PO TABS: 10.0000 mg | ORAL_TABLET | Freq: Every day | ORAL | Status: DC

## 2014-11-17 MED ORDER — HYDRALAZINE HCL 100 MG PO TABS: 100.0000 mg | ORAL_TABLET | Freq: Three times a day (TID) | ORAL | Status: DC

## 2014-11-17 MED ORDER — ISOSORBIDE MONONITRATE ER 30 MG PO TB24: 30.0000 mg | ORAL_TABLET | Freq: Two times a day (BID) | ORAL | Status: DC

## 2014-11-17 MED ORDER — CARVEDILOL 25 MG PO TABS: 25.0000 mg | ORAL_TABLET | Freq: Two times a day (BID) | ORAL | Status: DC

## 2014-12-06 ENCOUNTER — Encounter: Payer: Self-pay | Admitting: Internal Medicine

## 2015-01-04 ENCOUNTER — Encounter: Payer: Self-pay | Admitting: *Deleted

## 2015-01-04 DIAGNOSIS — Z006 Encounter for examination for normal comparison and control in clinical research program: Secondary | ICD-10-CM

## 2015-01-06 NOTE — Progress Notes (Signed)
Patient completed his final Guide it visit on 01/04/15. B/P 169/81, pulse 65, Resp 16 and O2 sat 96%. Weight 154 lbs.Patient had no complaints.We discussed the final letter for Guide It Study. Patient verbalized understanding. Patient is to continue to follow-up in CHF clinic as scheduled.

## 2015-06-22 ENCOUNTER — Emergency Department (HOSPITAL_COMMUNITY): Payer: BLUE CROSS/BLUE SHIELD

## 2015-06-22 ENCOUNTER — Emergency Department (HOSPITAL_COMMUNITY)
Admission: EM | Admit: 2015-06-22 | Discharge: 2015-06-22 | Disposition: A | Discharge status: Home / Self Care | Payer: BLUE CROSS/BLUE SHIELD | Attending: Emergency Medicine | Admitting: Emergency Medicine

## 2015-06-22 ENCOUNTER — Encounter (HOSPITAL_COMMUNITY): Payer: Self-pay

## 2015-06-22 DIAGNOSIS — Y92410 Unspecified street and highway as the place of occurrence of the external cause: Secondary | ICD-10-CM | POA: Insufficient documentation

## 2015-06-22 DIAGNOSIS — Y998 Other external cause status: Secondary | ICD-10-CM | POA: Diagnosis not present

## 2015-06-22 DIAGNOSIS — Z79899 Other long term (current) drug therapy: Secondary | ICD-10-CM | POA: Insufficient documentation

## 2015-06-22 DIAGNOSIS — Z9889 Other specified postprocedural states: Secondary | ICD-10-CM | POA: Diagnosis not present

## 2015-06-22 DIAGNOSIS — Y9389 Activity, other specified: Secondary | ICD-10-CM | POA: Insufficient documentation

## 2015-06-22 DIAGNOSIS — I10 Essential (primary) hypertension: Secondary | ICD-10-CM | POA: Insufficient documentation

## 2015-06-22 DIAGNOSIS — I5189 Other ill-defined heart diseases: Secondary | ICD-10-CM | POA: Insufficient documentation

## 2015-06-22 DIAGNOSIS — S29001A Unspecified injury of muscle and tendon of front wall of thorax, initial encounter: Secondary | ICD-10-CM | POA: Insufficient documentation

## 2015-06-22 MED ORDER — MORPHINE SULFATE (PF) 4 MG/ML IV SOLN
4.0000 mg | Freq: Once | INTRAVENOUS | Status: AC
  Administered 2015-06-22: 4 mg via INTRAVENOUS
  Filled 2015-06-22: qty 1

## 2015-06-22 MED ORDER — HYDROCODONE-ACETAMINOPHEN 5-325 MG PO TABS: 2.0000 | ORAL_TABLET | Freq: Once | ORAL | Status: DC

## 2015-06-22 MED ORDER — MORPHINE SULFATE (PF) 4 MG/ML IV SOLN
6.0000 mg | Freq: Once | INTRAVENOUS | Status: AC
  Administered 2015-06-22: 6 mg via INTRAVENOUS
  Filled 2015-06-22: qty 2

## 2015-06-22 MED ORDER — HYDROCODONE-ACETAMINOPHEN 5-325 MG PO TABS: 1.0000 | ORAL_TABLET | Freq: Four times a day (QID) | ORAL | Status: DC | PRN
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
## 2015-11-09 ENCOUNTER — Other Ambulatory Visit (HOSPITAL_COMMUNITY): Payer: Self-pay | Admitting: *Deleted

## 2015-11-09 MED ORDER — CARVEDILOL 25 MG PO TABS: 25.0000 mg | ORAL_TABLET | Freq: Two times a day (BID) | ORAL | Status: DC

## 2015-11-09 MED ORDER — ISOSORBIDE MONONITRATE ER 30 MG PO TB24: 30.0000 mg | ORAL_TABLET | Freq: Two times a day (BID) | ORAL | Status: DC

## 2015-11-09 MED ORDER — AMLODIPINE BESYLATE 10 MG PO TABS: 10.0000 mg | ORAL_TABLET | Freq: Every day | ORAL | Status: DC

## 2016-02-22 ENCOUNTER — Other Ambulatory Visit (HOSPITAL_COMMUNITY): Payer: Self-pay

## 2016-02-22 ENCOUNTER — Telehealth (HOSPITAL_COMMUNITY): Payer: Self-pay

## 2016-02-22 MED ORDER — CARVEDILOL 25 MG PO TABS: 25.0000 mg | ORAL_TABLET | Freq: Two times a day (BID) | ORAL | 0 refills | Status: DC

## 2016-02-22 MED ORDER — ISOSORBIDE MONONITRATE ER 30 MG PO TB24: 30.0000 mg | ORAL_TABLET | Freq: Two times a day (BID) | ORAL | 0 refills | Status: DC

## 2016-02-22 MED ORDER — HYDRALAZINE HCL 100 MG PO TABS: 100.0000 mg | ORAL_TABLET | Freq: Three times a day (TID) | ORAL | 0 refills | Status: DC

## 2016-02-22 MED ORDER — AMLODIPINE BESYLATE 10 MG PO TABS: 10.0000 mg | ORAL_TABLET | Freq: Every day | ORAL | 0 refills | Status: DC

## 2016-02-22 MED ORDER — FUROSEMIDE 20 MG PO TABS: ORAL_TABLET | ORAL | 0 refills | Status: DC

## 2016-02-22 NOTE — Telephone Encounter (Signed)
FINAL refill given to patient utnil his apt with CHF clinic at the end of the month.  Renee Pain, RN

## 2016-02-22 NOTE — Telephone Encounter (Signed)
Patient's wife calling CHF clinic triage to report patient will soon run out of meds and needs refills. Rx instructions state no further refills allowed until apt made. Apt made for the end of the month, however, will run out of meds before this apt is reached.  Advised will refill meds, and that this is the LAST time refills will be dispensed under CHF provider until they see [place]. Patient states 90 day supplies are preferred under insurance company.  Will send in cardiac meds with 0 refills. Renee Pain, RN

## 2016-02-23 ENCOUNTER — Other Ambulatory Visit (HOSPITAL_COMMUNITY): Payer: Self-pay | Admitting: *Deleted

## 2016-03-13 ENCOUNTER — Encounter (HOSPITAL_COMMUNITY): Payer: BLUE CROSS/BLUE SHIELD

## 2016-04-03 ENCOUNTER — Encounter (HOSPITAL_COMMUNITY): Payer: Self-pay

## 2016-04-03 ENCOUNTER — Ambulatory Visit (HOSPITAL_COMMUNITY)
Admission: RE | Admit: 2016-04-03 | Discharge: 2016-04-03 | Disposition: A | Discharge status: Home / Self Care | Payer: BLUE CROSS/BLUE SHIELD | Source: Ambulatory Visit | Attending: Cardiology | Admitting: Cardiology

## 2016-04-03 VITALS — BP 170/85 | HR 60 | Wt 153.4 lb

## 2016-04-03 DIAGNOSIS — I5032 Chronic diastolic (congestive) heart failure: Secondary | ICD-10-CM | POA: Diagnosis not present

## 2016-04-03 DIAGNOSIS — I5022 Chronic systolic (congestive) heart failure: Secondary | ICD-10-CM | POA: Diagnosis not present

## 2016-04-03 DIAGNOSIS — I10 Essential (primary) hypertension: Secondary | ICD-10-CM

## 2016-04-03 DIAGNOSIS — N183 Chronic kidney disease, stage 3 (moderate): Secondary | ICD-10-CM | POA: Diagnosis not present

## 2016-04-03 DIAGNOSIS — I130 Hypertensive heart and chronic kidney disease with heart failure and stage 1 through stage 4 chronic kidney disease, or unspecified chronic kidney disease: Secondary | ICD-10-CM | POA: Diagnosis not present

## 2016-04-03 DIAGNOSIS — I5042 Chronic combined systolic (congestive) and diastolic (congestive) heart failure: Secondary | ICD-10-CM | POA: Diagnosis not present

## 2016-04-03 DIAGNOSIS — I429 Cardiomyopathy, unspecified: Secondary | ICD-10-CM | POA: Insufficient documentation

## 2016-04-03 LAB — BASIC METABOLIC PANEL
ANION GAP: 10 (ref 5–15)
BUN: 25 mg/dL — ABNORMAL HIGH (ref 6–20)
CHLORIDE: 105 mmol/L (ref 101–111)
CO2: 24 mmol/L (ref 22–32)
Calcium: 10 mg/dL (ref 8.9–10.3)
Creatinine, Ser: 1.34 mg/dL — ABNORMAL HIGH (ref 0.61–1.24)
GFR, EST NON AFRICAN AMERICAN: 58 mL/min — AB (ref >60)
Glucose, Bld: 127 mg/dL — ABNORMAL HIGH (ref 65–99)
POTASSIUM: 3.9 mmol/L (ref 3.5–5.1)
SODIUM: 139 mmol/L (ref 135–145)

## 2016-04-03 LAB — CBC
HEMATOCRIT: 42.4 % (ref 39.0–52.0)
HEMOGLOBIN: 14.9 g/dL (ref 13.0–17.0)
MCH: 31.2 pg (ref 26.0–34.0)
MCHC: 35.1 g/dL (ref 30.0–36.0)
MCV: 88.7 fL (ref 78.0–100.0)
Platelets: 133 10*3/uL — ABNORMAL LOW (ref 150–400)
RBC: 4.78 MIL/uL (ref 4.22–5.81)
RDW: 12.3 % (ref 11.5–15.5)
WBC: 5.8 10*3/uL (ref 4.0–10.5)

## 2016-04-03 LAB — LIPID PANEL
Cholesterol: 175 mg/dL (ref 0–200)
HDL: 57 mg/dL (ref >40)
LDL CALC: 106 mg/dL — AB (ref 0–99)
Total CHOL/HDL Ratio: 3.1 RATIO
Triglycerides: 62 mg/dL (ref <150)
VLDL: 12 mg/dL (ref 0–40)

## 2016-04-03 MED ORDER — ISOSORBIDE MONONITRATE ER 30 MG PO TB24: 30.0000 mg | ORAL_TABLET | Freq: Two times a day (BID) | ORAL | 6 refills | Status: DC

## 2016-04-03 MED ORDER — FUROSEMIDE 20 MG PO TABS: ORAL_TABLET | ORAL | 6 refills | Status: DC

## 2016-04-03 MED ORDER — LISINOPRIL 10 MG PO TABS: 10.0000 mg | ORAL_TABLET | Freq: Every day | ORAL | 6 refills | Status: DC

## 2016-04-03 MED ORDER — HYDRALAZINE HCL 100 MG PO TABS: 100.0000 mg | ORAL_TABLET | Freq: Three times a day (TID) | ORAL | 6 refills | Status: DC

## 2016-04-03 MED ORDER — CARVEDILOL 25 MG PO TABS: 25.0000 mg | ORAL_TABLET | Freq: Two times a day (BID) | ORAL | 6 refills | Status: DC

## 2016-04-03 MED ORDER — AMLODIPINE BESYLATE 10 MG PO TABS: 10.0000 mg | ORAL_TABLET | Freq: Every day | ORAL | 6 refills | Status: DC

## 2016-04-03 NOTE — Patient Instructions (Signed)
Labs today (will call for abnormal results, otherwise no news is good news)  Start Lisinopril 10 mg (1 Tab) Daily Please record BP readings daily and bring your readings with you in 2 weeks for lab work  Lab work in 2 weeks- Bring BP readings  Echo has been ordered   Follow up in 6 months

## 2016-04-03 NOTE — Progress Notes (Signed)
Patient ID: TURHAN CHILL, male   DOB: Jul 25, 1960, 55 y.o.   MRN: [id number] PCP: Dr. Claris Gower  HPI: Mr. Hain is a 55 yo male with a history of HTN, CKD stage III, and nonischemic cardiomyopathy.   Admitted 6/22-6/26/15 with hypertensive urgency. Was treated with medications and underwent L/RHC showing no significant CAD, mild/mod pulmonary HTN and normal filling pressures. D/C weight 149 lbs  ECHOs (11/03/13) EF 20%, grade III DD (9/15) EF 28%, grade I diastolic dysfunction, normal RV size and systolic function  RHC (4/13) with mean RA 4, PA 35/10 (mean 20), mean PCWP 8, CI 3.64.   Renal artery dopplers (8/15) with no significant stenosis.   Lisinopril had been stopped due to elevated creatinine.   Patient is no longer drinking ETOH.  He is taking his meds but BP still running a bit high.  Typically SBP runs 150s at home.  No exertional dyspnea.  No orthopnea or PND. No chest pain.  No lightheadedness. No palpitations.  He is still working at [employer].   ECG: NSR with PVCs, LVH  Labs:  (11/06/13): K 3.7, creatinine 1.34 (11/12/13): K 5.4, creatinine 1.8, TSH 8.42, free T4 1.28 (11/26/13): K 4.7, creatinine 1.59 (12/18/13): K 4.7, creatinine 1.65 (9/15): K 4.5, creatinine 1.7 (1/16): K 4.4, creatinine 1.44, pro-BNP 118 (6/16): K 4.5, creatinine 1.3  SH: Married and lives in [place]. No children. 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 Not currently drinking or smoking. Previously drank 8-9 beers a day.   FH: Mother living: pre-diabetic        Father living: HTN, CAD, ICD  ROS: All systems negative except as listed in HPI, PMH and Problem List.  Past Medical History:  Diagnosis Date  . Combined systolic and diastolic cardiac dysfunction    Echo 11/03/2013 EF 24%, grade 3 diastolic dysfunction  . Hypertension   . NICM (nonischemic cardiomyopathy) ([place])    a. L/RHC (11/05/13): RA: 3, RV 52/5, PA 49/19 (31), PCWP 10, AO 166/93, PA 67%, Fick CO/CI: 5.71/2.97, Lmain: normal, LAD:  large, without signficant dz, first diagonal has 20% dz at ostium, LCx: normal, RCA: 30% stenosis at the bifurcation of PDA and PLOM    Current Outpatient Prescriptions  Medication Sig Dispense Refill  . amLODipine (NORVASC) 10 MG tablet Take 1 tablet (10 mg total) by mouth daily. FINAL WARNING-NO FURTHER REFILLS UNTIL SEEN IN CHF CLINIC. 30 tablet 6  . carvedilol (COREG) 25 MG tablet Take 1 tablet (25 mg total) by mouth 2 (two) times daily with a meal. FINAL WARNING-NO FURTHER REFILLS UNTIL SEEN IN CHF CLINIC. 60 tablet 6  . furosemide (LASIX) 20 MG tablet Take for weight gain 3lbs day or 5 lbs in a week.FINAL WARNING-NO FURTHER REFILLS UNTIL SEEN IN CHF CLINIC. 30 tablet 6  . hydrALAZINE (APRESOLINE) 100 MG tablet Take 1 tablet (100 mg total) by mouth 3 (three) times daily. FINAL WARNING-NO FURTHER REFILLS UNTIL SEEN IN CHF CLINIC. 90 tablet 6  . isosorbide mononitrate (IMDUR) 30 MG 24 hr tablet Take 1 tablet (30 mg total) by mouth 2 (two) times daily. FINAL WARNING-NO FURTHER REFILLS UNTIL SEEN IN CHF CLINIC. 60 tablet 6  . lisinopril (PRINIVIL,ZESTRIL) 10 MG tablet Take 1 tablet (10 mg total) by mouth daily. 30 tablet 6   No current facility-administered medications for this encounter.     Vitals:   04/03/16 0920  BP: (!) 170/85  Pulse: 60  SpO2: 99%  Weight: 153 lb 6.4 oz (69.6 kg)  PHYSICAL EXAM:  General:  Well appearing. No resp difficulty.  HEENT: normal Neck: supple. JVP flat. Carotids 2+ bilaterally; no bruits. No lymphadenopathy or thryomegaly appreciated. Cor: PMI normal. Regular rate & rhythm. No rubs, gallops or murmurs. Lungs: clear Abdomen: soft, nontender, nondistended. No hepatosplenomegaly. No bruits or masses. Good bowel sounds. Extremities: no cyanosis, clubbing, rash, edema Neuro: alert & orientedx3, cranial nerves grossly intact. Moves all 4 extremities w/o difficulty. Affect pleasant.  ASSESSMENT & PLAN:  1) Chronic diastolic CHF: Nonischemic  cardiomyopathy, EF 20% on prior echo now up to 55%.  Possibly due to a combination of uncontrolled HTN and ETOH. He is no longer drinking. NYHA I symptoms and volume status stable.  RHC showed normal cardiac output and normal filling pressures. No ICD given recovery of function.  BP, unfortunately, remains high. - Continue current Coreg at 25 mg bid. - Continue hydralazine/Imdur.  - Euvolemic, he does not need Lasix.  - Repeat echo to make sure that EF remains normal.  2) HTN: Renal artery dopplers not suggestive of renal artery stenosis.  BP still high, continue Coreg, hydralazine, amlodipine.  Creatinine is better, he can start lisinopril 10 mg daily.  He will check BP daily after starting lisinopril and bring [place] a list of readings when he comes in for repeat BMET in 2 wks.  3) Previous ETOH abuse: Possible contributor to his cardiomyopathy.  I encouraged him to stay off ETOH.  4) CKD: BMET today.    Followup in 6 months.   Loralie Champagne 04/03/2016

## 2016-04-04 ENCOUNTER — Telehealth (HOSPITAL_COMMUNITY): Payer: Self-pay | Admitting: Pharmacist

## 2016-04-04 NOTE — Telephone Encounter (Signed)
Per [facility], Jeremy Grant has exceeded his maintenance retail fills on his amlodipine. He likely will need to get his amlodipine filled through [facility] mail order from now on.  Ruta Hinds. Velva Harman, PharmD, BCPS, CPP Clinical Pharmacist Pager: [phone] Phone: [phone] 04/04/2016 8:49 AM

## 2016-04-17 ENCOUNTER — Ambulatory Visit (HOSPITAL_COMMUNITY)
Admission: RE | Admit: 2016-04-17 | Discharge: 2016-04-17 | Disposition: A | Discharge status: Home / Self Care | Payer: BLUE CROSS/BLUE SHIELD | Source: Ambulatory Visit | Attending: Cardiology | Admitting: Cardiology

## 2016-04-17 DIAGNOSIS — I5042 Chronic combined systolic (congestive) and diastolic (congestive) heart failure: Secondary | ICD-10-CM | POA: Diagnosis not present

## 2016-04-17 LAB — BASIC METABOLIC PANEL
Anion gap: 6 (ref 5–15)
BUN: 23 mg/dL — ABNORMAL HIGH (ref 6–20)
CHLORIDE: 103 mmol/L (ref 101–111)
CO2: 29 mmol/L (ref 22–32)
Calcium: 9.6 mg/dL (ref 8.9–10.3)
Creatinine, Ser: 1.32 mg/dL — ABNORMAL HIGH (ref 0.61–1.24)
GFR calc non Af Amer: 59 mL/min — ABNORMAL LOW (ref >60)
Glucose, Bld: 149 mg/dL — ABNORMAL HIGH (ref 65–99)
POTASSIUM: 4.3 mmol/L (ref 3.5–5.1)
SODIUM: 138 mmol/L (ref 135–145)

## 2016-05-01 ENCOUNTER — Ambulatory Visit (HOSPITAL_COMMUNITY)
Admission: RE | Admit: 2016-05-01 | Discharge: 2016-05-01 | Disposition: A | Discharge status: Home / Self Care | Payer: BLUE CROSS/BLUE SHIELD | Source: Ambulatory Visit | Attending: Family Medicine | Admitting: Family Medicine

## 2016-05-01 DIAGNOSIS — I5022 Chronic systolic (congestive) heart failure: Secondary | ICD-10-CM | POA: Diagnosis not present

## 2016-05-01 DIAGNOSIS — I5042 Chronic combined systolic (congestive) and diastolic (congestive) heart failure: Secondary | ICD-10-CM | POA: Insufficient documentation

## 2016-05-01 LAB — ECHOCARDIOGRAM COMPLETE
CHL CUP DOP CALC LVOT VTI: 29.9 cm
CHL CUP MV DEC (S): 285
EERAT: 8.06
EWDT: 285 ms
FS: 33 % (ref 28–44)
IVS/LV PW RATIO, ED: 0.9
LA ID, A-P, ES: 41 mm
LADIAMINDEX: 2.14 cm/m2
LAVOL: 64.7 mL
LAVOLA4C: 57.8 mL
LAVOLIN: 33.7 mL/m2
LDCA: 4.15 cm2
LEFT ATRIUM END SYS DIAM: 41 mm
LV E/e'average: 8.06
LV PW d: 10.6 mm — AB (ref 0.6–1.1)
LV TDI E'MEDIAL: 7.15
LVEEMED: 8.06
LVELAT: 13.9 cm/s
LVOT SV: 124 mL
LVOTD: 23 mm
LVOTPV: 106 cm/s
Lateral S' vel: 15.6 cm/s
MV Peak grad: 5 mmHg
MV pk E vel: 112 m/s
MVPKAVEL: 91.8 m/s
TAPSE: 27.2 mm
TDI e' lateral: 13.9

## 2016-05-01 NOTE — Progress Notes (Signed)
  Echocardiogram 2D Echocardiogram has been performed.  Johny Chess 05/01/2016, 12:08 PM

## 2017-01-15 ENCOUNTER — Telehealth (HOSPITAL_COMMUNITY): Payer: Self-pay | Admitting: Vascular Surgery

## 2017-01-15 MED ORDER — CARVEDILOL 25 MG PO TABS: 25.0000 mg | ORAL_TABLET | Freq: Two times a day (BID) | ORAL | 0 refills | Status: DC

## 2017-01-15 MED ORDER — HYDRALAZINE HCL 100 MG PO TABS: 100.0000 mg | ORAL_TABLET | Freq: Three times a day (TID) | ORAL | 0 refills | Status: DC

## 2017-01-15 MED ORDER — LISINOPRIL 10 MG PO TABS
10.0000 mg | ORAL_TABLET | Freq: Every day | ORAL | 0 refills | Status: DC
Start: 2017-01-15 — End: 2018-07-16

## 2017-01-15 MED ORDER — FUROSEMIDE 20 MG PO TABS: ORAL_TABLET | ORAL | 0 refills | Status: DC

## 2017-01-15 MED ORDER — ISOSORBIDE MONONITRATE ER 30 MG PO TB24: 30.0000 mg | ORAL_TABLET | Freq: Two times a day (BID) | ORAL | 6 refills | Status: DC

## 2017-01-15 MED ORDER — AMLODIPINE BESYLATE 10 MG PO TABS: 10.0000 mg | ORAL_TABLET | Freq: Every day | ORAL | 0 refills | Status: DC

## 2017-01-15 NOTE — Telephone Encounter (Signed)
Pt needs ASAP 3 month  refill of all medication please advise

## 2017-01-15 NOTE — Telephone Encounter (Signed)
Advised I would send refills until patient can make an appointment. Reports he was not going to make a follow up as the cost for visits at this office is very expensive.   Advised I could refill meds #90 with no additional refills and would need a follow up to continue meds, will ask Dr Aundra Dubin if patient can follow up with [facility] as a facility charge is not associated with visits.    Ok to follow up with [facility]?

## 2017-01-15 NOTE — Telephone Encounter (Signed)
EF back to normal, he can followup with [facility]

## 2017-01-16 NOTE — Telephone Encounter (Signed)
FWD to CVD scheduling pool to assist with follow up at [facility]

## 2018-07-16 ENCOUNTER — Inpatient Hospital Stay (HOSPITAL_COMMUNITY)
Admission: EM | Admit: 2018-07-16 | Discharge: 2018-08-04 | DRG: 699 | Disposition: A | Discharge status: Home / Self Care | Payer: BLUE CROSS/BLUE SHIELD | Attending: Internal Medicine | Admitting: Internal Medicine

## 2018-07-16 ENCOUNTER — Other Ambulatory Visit: Payer: Self-pay

## 2018-07-16 ENCOUNTER — Inpatient Hospital Stay (HOSPITAL_COMMUNITY): Payer: BLUE CROSS/BLUE SHIELD

## 2018-07-16 ENCOUNTER — Encounter (HOSPITAL_COMMUNITY): Payer: Self-pay | Admitting: Emergency Medicine

## 2018-07-16 DIAGNOSIS — S37012A Minor contusion of left kidney, initial encounter: Secondary | ICD-10-CM | POA: Diagnosis not present

## 2018-07-16 DIAGNOSIS — E440 Moderate protein-calorie malnutrition: Secondary | ICD-10-CM | POA: Diagnosis present

## 2018-07-16 DIAGNOSIS — N057 Unspecified nephritic syndrome with diffuse crescentic glomerulonephritis: Principal | ICD-10-CM | POA: Diagnosis present

## 2018-07-16 DIAGNOSIS — I10 Essential (primary) hypertension: Secondary | ICD-10-CM | POA: Diagnosis present

## 2018-07-16 DIAGNOSIS — Z8679 Personal history of other diseases of the circulatory system: Secondary | ICD-10-CM

## 2018-07-16 DIAGNOSIS — F1011 Alcohol abuse, in remission: Secondary | ICD-10-CM | POA: Diagnosis present

## 2018-07-16 DIAGNOSIS — I340 Nonrheumatic mitral (valve) insufficiency: Secondary | ICD-10-CM | POA: Diagnosis not present

## 2018-07-16 DIAGNOSIS — N183 Chronic kidney disease, stage 3 (moderate): Secondary | ICD-10-CM | POA: Diagnosis present

## 2018-07-16 DIAGNOSIS — E872 Acidosis: Secondary | ICD-10-CM | POA: Diagnosis present

## 2018-07-16 DIAGNOSIS — F1021 Alcohol dependence, in remission: Secondary | ICD-10-CM | POA: Diagnosis present

## 2018-07-16 DIAGNOSIS — I130 Hypertensive heart and chronic kidney disease with heart failure and stage 1 through stage 4 chronic kidney disease, or unspecified chronic kidney disease: Secondary | ICD-10-CM | POA: Diagnosis present

## 2018-07-16 DIAGNOSIS — Y848 Other medical procedures as the cause of abnormal reaction of the patient, or of later complication, without mention of misadventure at the time of the procedure: Secondary | ICD-10-CM | POA: Diagnosis not present

## 2018-07-16 DIAGNOSIS — C9000 Multiple myeloma not having achieved remission: Secondary | ICD-10-CM

## 2018-07-16 DIAGNOSIS — Z681 Body mass index (BMI) 19 or less, adult: Secondary | ICD-10-CM

## 2018-07-16 DIAGNOSIS — Z8249 Family history of ischemic heart disease and other diseases of the circulatory system: Secondary | ICD-10-CM | POA: Diagnosis not present

## 2018-07-16 DIAGNOSIS — D61818 Other pancytopenia: Secondary | ICD-10-CM

## 2018-07-16 DIAGNOSIS — Z79899 Other long term (current) drug therapy: Secondary | ICD-10-CM

## 2018-07-16 DIAGNOSIS — D72822 Plasmacytosis: Secondary | ICD-10-CM | POA: Diagnosis present

## 2018-07-16 DIAGNOSIS — R779 Abnormality of plasma protein, unspecified: Secondary | ICD-10-CM | POA: Diagnosis not present

## 2018-07-16 DIAGNOSIS — N99840 Postprocedural hematoma of a genitourinary system organ or structure following a genitourinary system procedure: Secondary | ICD-10-CM | POA: Diagnosis not present

## 2018-07-16 DIAGNOSIS — D649 Anemia, unspecified: Secondary | ICD-10-CM | POA: Diagnosis present

## 2018-07-16 DIAGNOSIS — I428 Other cardiomyopathies: Secondary | ICD-10-CM | POA: Diagnosis not present

## 2018-07-16 DIAGNOSIS — I5042 Chronic combined systolic (congestive) and diastolic (congestive) heart failure: Secondary | ICD-10-CM | POA: Diagnosis present

## 2018-07-16 DIAGNOSIS — R531 Weakness: Secondary | ICD-10-CM | POA: Diagnosis present

## 2018-07-16 DIAGNOSIS — I776 Arteritis, unspecified: Secondary | ICD-10-CM | POA: Diagnosis present

## 2018-07-16 DIAGNOSIS — N179 Acute kidney failure, unspecified: Secondary | ICD-10-CM

## 2018-07-16 DIAGNOSIS — I5030 Unspecified diastolic (congestive) heart failure: Secondary | ICD-10-CM | POA: Diagnosis not present

## 2018-07-16 DIAGNOSIS — D631 Anemia in chronic kidney disease: Secondary | ICD-10-CM | POA: Diagnosis present

## 2018-07-16 DIAGNOSIS — D62 Acute posthemorrhagic anemia: Secondary | ICD-10-CM | POA: Diagnosis not present

## 2018-07-16 DIAGNOSIS — N289 Disorder of kidney and ureter, unspecified: Secondary | ICD-10-CM

## 2018-07-16 HISTORY — DX: Heart failure, unspecified: I50.9

## 2018-07-16 HISTORY — DX: Unspecified osteoarthritis, unspecified site: M19.90

## 2018-07-16 LAB — COMPREHENSIVE METABOLIC PANEL
ALT: 19 U/L (ref 0–44)
AST: 16 U/L (ref 15–41)
Albumin: 3.3 g/dL — ABNORMAL LOW (ref 3.5–5.0)
Alkaline Phosphatase: 44 U/L (ref 38–126)
Anion gap: 14 (ref 5–15)
BUN: 121 mg/dL — ABNORMAL HIGH (ref 6–20)
CHLORIDE: 106 mmol/L (ref 98–111)
CO2: 15 mmol/L — ABNORMAL LOW (ref 22–32)
Calcium: 8.9 mg/dL (ref 8.9–10.3)
Creatinine, Ser: 6.23 mg/dL — ABNORMAL HIGH (ref 0.61–1.24)
GFR, EST AFRICAN AMERICAN: 10 mL/min — AB (ref >60)
GFR, EST NON AFRICAN AMERICAN: 9 mL/min — AB (ref >60)
Glucose, Bld: 119 mg/dL — ABNORMAL HIGH (ref 70–99)
POTASSIUM: 4.5 mmol/L (ref 3.5–5.1)
SODIUM: 135 mmol/L (ref 135–145)
Total Bilirubin: 0.4 mg/dL (ref 0.3–1.2)
Total Protein: 7.2 g/dL (ref 6.5–8.1)

## 2018-07-16 LAB — DIRECT ANTIGLOBULIN TEST (NOT AT ARMC)
DAT, IgG: NEGATIVE
DAT, complement: NEGATIVE

## 2018-07-16 LAB — CBC WITH DIFFERENTIAL/PLATELET
Abs Immature Granulocytes: 0 10*3/uL (ref 0.00–0.07)
BASOS PCT: 0 %
Basophils Absolute: 0 10*3/uL (ref 0.0–0.1)
EOS ABS: 0 10*3/uL (ref 0.0–0.5)
Eosinophils Relative: 0 %
HCT: 15.2 % — ABNORMAL LOW (ref 39.0–52.0)
Hemoglobin: 4.7 g/dL — CL (ref 13.0–17.0)
Lymphocytes Relative: 15 %
Lymphs Abs: 0.4 10*3/uL — ABNORMAL LOW (ref 0.7–4.0)
MCH: 28.5 pg (ref 26.0–34.0)
MCHC: 30.9 g/dL (ref 30.0–36.0)
MCV: 92.1 fL (ref 80.0–100.0)
Monocytes Absolute: 0.1 10*3/uL (ref 0.1–1.0)
Monocytes Relative: 3 %
NEUTROS PCT: 82 %
NRBC: 0 % (ref 0.0–0.2)
NRBC: 0 /100{WBCs}
Neutro Abs: 2.3 10*3/uL (ref 1.7–7.7)
PLATELETS: 124 10*3/uL — AB (ref 150–400)
RBC: 1.65 MIL/uL — AB (ref 4.22–5.81)
RDW: 13.1 % (ref 11.5–15.5)
WBC: 2.8 10*3/uL — AB (ref 4.0–10.5)

## 2018-07-16 LAB — URINALYSIS, COMPLETE (UACMP) WITH MICROSCOPIC
Bilirubin Urine: NEGATIVE
Glucose, UA: NEGATIVE mg/dL
Ketones, ur: NEGATIVE mg/dL
Leukocytes,Ua: NEGATIVE
Nitrite: NEGATIVE
Protein, ur: 30 mg/dL — AB
RBC / HPF: >50 RBC/hpf — ABNORMAL HIGH (ref 0–5)
SPECIFIC GRAVITY, URINE: 1.009 (ref 1.005–1.030)
pH: 5 (ref 5.0–8.0)

## 2018-07-16 LAB — FERRITIN: FERRITIN: 108 ng/mL (ref 24–336)

## 2018-07-16 LAB — RETICULOCYTES
Immature Retic Fract: 5.3 % (ref 2.3–15.9)
RBC.: 1.68 MIL/uL — ABNORMAL LOW (ref 4.22–5.81)
RETIC COUNT ABSOLUTE: 32.9 10*3/uL (ref 19.0–186.0)
Retic Ct Pct: 2 % (ref 0.4–3.1)

## 2018-07-16 LAB — IRON AND TIBC
Iron: 29 ug/dL — ABNORMAL LOW (ref 45–182)
Saturation Ratios: 10 % — ABNORMAL LOW (ref 17.9–39.5)
TIBC: 298 ug/dL (ref 250–450)
UIBC: 269 ug/dL

## 2018-07-16 LAB — VITAMIN B12: Vitamin B-12: 567 pg/mL (ref 180–914)

## 2018-07-16 LAB — PREPARE RBC (CROSSMATCH)

## 2018-07-16 LAB — TSH: TSH: 4.043 u[IU]/mL (ref 0.350–4.500)

## 2018-07-16 LAB — POC OCCULT BLOOD, ED: Fecal Occult Bld: NEGATIVE

## 2018-07-16 LAB — LACTATE DEHYDROGENASE: LDH: 158 U/L (ref 98–192)

## 2018-07-16 LAB — ABO/RH: ABO/RH(D): O NEG

## 2018-07-16 MED ORDER — ACETAMINOPHEN 325 MG PO TABS: 650.0000 mg | ORAL_TABLET | Freq: Four times a day (QID) | ORAL | Status: DC | PRN

## 2018-07-16 MED ORDER — ACETAMINOPHEN 650 MG RE SUPP: 650.0000 mg | Freq: Four times a day (QID) | RECTAL | Status: DC | PRN

## 2018-07-16 MED ORDER — LORAZEPAM 2 MG/ML IJ SOLN: 1.0000 mg | Freq: Four times a day (QID) | INTRAMUSCULAR | Status: AC | PRN

## 2018-07-16 MED ORDER — VITAMIN B-1 100 MG PO TABS
100.0000 mg | ORAL_TABLET | Freq: Every day | ORAL | Status: DC
  Administered 2018-07-16 – 2018-08-03 (×19): 100 mg via ORAL
  Filled 2018-07-16 (×21): qty 1

## 2018-07-16 MED ORDER — ADULT MULTIVITAMIN W/MINERALS CH
1.0000 | ORAL_TABLET | Freq: Every day | ORAL | Status: DC
  Administered 2018-07-16 – 2018-07-29 (×14): 1 via ORAL
  Filled 2018-07-16 (×14): qty 1

## 2018-07-16 MED ORDER — LORAZEPAM 1 MG PO TABS: 1.0000 mg | ORAL_TABLET | Freq: Four times a day (QID) | ORAL | Status: AC | PRN

## 2018-07-16 MED ORDER — SODIUM CHLORIDE 0.9% IV SOLUTION: Freq: Once | INTRAVENOUS | Status: DC

## 2018-07-16 MED ORDER — ONDANSETRON HCL 4 MG/2ML IJ SOLN: 4.0000 mg | Freq: Four times a day (QID) | INTRAMUSCULAR | Status: DC | PRN

## 2018-07-16 MED ORDER — ONDANSETRON HCL 4 MG PO TABS: 4.0000 mg | ORAL_TABLET | Freq: Four times a day (QID) | ORAL | Status: DC | PRN

## 2018-07-16 MED ORDER — FOLIC ACID 1 MG PO TABS
1.0000 mg | ORAL_TABLET | Freq: Every day | ORAL | Status: DC
  Administered 2018-07-16 – 2018-08-03 (×19): 1 mg via ORAL
  Filled 2018-07-16 (×19): qty 1

## 2018-07-16 MED ORDER — THIAMINE HCL 100 MG/ML IJ SOLN
100.0000 mg | Freq: Every day | INTRAMUSCULAR | Status: DC
  Filled 2018-07-16 (×4): qty 2

## 2018-07-16 NOTE — ED Provider Notes (Signed)
[facility] EMERGENCY DEPARTMENT Provider Note   CSN: [id number] Arrival date & time: 07/16/18  1747    History   Chief Complaint Chief Complaint  Patient presents with  . Abnormal Lab    HPI Jeremy Grant is a 58 y.o. male.     HPI 58 year old male presents today with new onset anemia and renal failure.  He was seen by Dr. Darron Doom, primary care physician, in her office yesterday.  She obtained labs and called him today and told to come to the ED.  Spoke with her earlier and she stated that he had a new anemia with a hemoglobin of 5 and creatinine elevated to 5.  Here he had labs drawn prior to my evaluation his hemoglobin is 4.7.  Patient states that he has had some generalized weakness.  He denies any headache, head injury, chest pain, dyspnea, abdominal pain, nausea, vomiting, diarrhea, or weight change.  He states he has had some increased urine output.  He has been been taking his medications as prescribed. Past Medical History:  Diagnosis Date  . Combined systolic and diastolic cardiac dysfunction    Echo 11/03/2013 EF 82%, grade 3 diastolic dysfunction  . Hypertension   . NICM (nonischemic cardiomyopathy) ([place])    a. L/RHC (11/05/13): RA: 3, RV 52/5, PA 49/19 (31), PCWP 10, AO 166/93, PA 67%, Fick CO/CI: 5.71/2.97, Lmain: normal, LAD: large, without signficant dz, first diagonal has 20% dz at ostium, LCx: normal, RCA: 30% stenosis at the bifurcation of PDA and PLOM    Patient Active Problem List   Diagnosis Date Noted  . NICM (nonischemic cardiomyopathy) ([place]) 11/12/2013  . History of ETOH abuse 11/12/2013  . Chronic combined systolic and diastolic CHF (congestive heart failure) ([place]) 11/12/2013  . Acute systolic CHF (congestive heart failure), NYHA class 4 ([place]) 11/04/2013  . Congestive dilated cardiomyopathy ([place]) 11/04/2013  . Acute diastolic CHF (congestive heart failure), NYHA class 4 ([place]) 11/02/2013  . CHF (congestive heart failure) ([place])  11/02/2013  . Malignant hypertension     Past Surgical History:  Procedure Laterality Date  . LEFT AND RIGHT HEART CATHETERIZATION WITH CORONARY ANGIOGRAM N/A 11/05/2013   Procedure: LEFT AND RIGHT HEART CATHETERIZATION WITH CORONARY ANGIOGRAM;  Surgeon: Peter M Martinique, MD;  Location: [facility] CATH LAB;  Service: Cardiovascular;  Laterality: N/A;  . RIGHT HEART CATHETERIZATION N/A 01/14/2014   Procedure: RIGHT HEART CATH;  Surgeon: Larey Dresser, MD;  Location: [facility] C[facility] LAB;  Service: Cardiovascular;  Laterality: N/A;        Home Medications    Prior to Admission medications   Medication Sig Start Date End Date Taking? Authorizing Provider  amLODipine (NORVASC) 10 MG tablet Take 1 tablet (10 mg total) by mouth daily. FINAL WARNING-NO FURTHER REFILLS UNTIL SEEN IN CHF CLINIC. 01/15/17   Larey Dresser, MD  carvedilol (COREG) 25 MG tablet Take 1 tablet (25 mg total) by mouth 2 (two) times daily with a meal. FINAL WARNING-NO FURTHER REFILLS UNTIL SEEN IN CHF CLINIC. 01/15/17   Larey Dresser, MD  furosemide (LASIX) 20 MG tablet Take for weight gain 3lbs day or 5 lbs in a week.FINAL WARNING-NO FURTHER REFILLS UNTIL SEEN IN CHF CLINIC. 01/15/17   Larey Dresser, MD  hydrALAZINE (APRESOLINE) 100 MG tablet Take 1 tablet (100 mg total) by mouth 3 (three) times daily. FINAL WARNING-NO FURTHER REFILLS UNTIL SEEN IN CHF CLINIC. 01/15/17   Larey Dresser, MD  isosorbide mononitrate (IMDUR) 30 MG 24 hr tablet  Take 1 tablet (30 mg total) by mouth 2 (two) times daily. FINAL WARNING-NO FURTHER REFILLS UNTIL SEEN IN CHF CLINIC. 01/15/17   Larey Dresser, MD  lisinopril (PRINIVIL,ZESTRIL) 10 MG tablet Take 1 tablet (10 mg total) by mouth daily. 01/15/17 04/15/17  Larey Dresser, MD    Family History Family History  Problem Relation Age of Onset  . Heart attack Father   . Heart disease Father   . Arrhythmia Father   . Hypertension Brother   . Hypertension Brother     Social History Social History    Tobacco Use  . Smoking status: Never Smoker  . Smokeless tobacco: Never Used  Substance Use Topics  . Alcohol use: Yes    Alcohol/week: 9.0 standard drinks    Types: 9 Cans of beer per week    Comment: 9 beers at least a night,   . Drug use: No     Allergies   Patient has no known allergies.   Review of Systems Review of Systems  All other systems reviewed and are negative.    Physical Exam Updated Vital Signs BP (!) 155/52 (BP Location: Right Arm)   Pulse 63   Temp 98.2 F (36.8 C) (Oral)   Resp 16   Ht 1.854 m (6\' 1" )   Wt 64 kg   SpO2 100%   BMI 18.60 kg/m   Physical Exam Vitals signs and nursing note reviewed.  Constitutional:      General: He is not in acute distress.    Appearance: Normal appearance. He is normal weight.  HENT:     Head: Normocephalic and atraumatic.     Right Ear: External ear normal.     Left Ear: External ear normal.     Nose: Nose normal.     Mouth/Throat:     Mouth: Mucous membranes are moist.  Eyes:     Pupils: Pupils are equal, round, and reactive to light.     Comments: Conjunctive are pale  Neck:     Musculoskeletal: Normal range of motion and neck supple.  Cardiovascular:     Rate and Rhythm: Normal rate.     Pulses: Normal pulses.  Pulmonary:     Effort: Pulmonary effort is normal.     Breath sounds: Normal breath sounds.  Abdominal:     General: Abdomen is flat.  Musculoskeletal: Normal range of motion.  Skin:    General: Skin is warm and dry.     Capillary Refill: Capillary refill takes less than 2 seconds.  Neurological:     General: No focal deficit present.     Mental Status: He is alert and oriented to person, place, and time.     Cranial Nerves: No cranial nerve deficit.     Motor: No weakness.  Psychiatric:        Mood and Affect: Mood normal.        Behavior: Behavior normal.      ED Treatments / Results  Labs (all labs ordered are listed, but only abnormal results are displayed) Labs  Reviewed  CBC WITH DIFFERENTIAL/PLATELET - Abnormal; Notable for the following components:      Result Value   WBC 2.8 (*)    RBC 1.65 (*)    Hemoglobin 4.7 (*)    HCT 15.2 (*)    Platelets 124 (*)    Lymphs Abs 0.4 (*)    All other components within normal limits  COMPREHENSIVE METABOLIC PANEL - Abnormal; Notable for the following  components:   CO2 15 (*)    Glucose, Bld 119 (*)    BUN 121 (*)    Creatinine, Ser 6.23 (*)    Albumin 3.3 (*)    GFR calc non Af Amer 9 (*)    GFR calc Af Amer 10 (*)    All other components within normal limits    EKG EKG Interpretation  Date/Time:  Wednesday July 16 2018 20:01:06 EST Ventricular Rate:  69 PR Interval:    QRS Duration: 110 QT Interval:  417 QTC Calculation: 447 R Axis:   37 Text Interpretation:  Normal sinus rhythm Left ventricular hypertrophy No significant change since last tracing Confirmed by Pattricia Boss [id number]) on 07/16/2018 8:39:43 PM   Radiology No results found.  Procedures Procedures (including critical care time)  Medications Ordered in ED Medications - No data to display   Initial Impression / Assessment and Plan / ED Course  I have reviewed the triage vital signs and the nursing notes.  Pertinent labs & imaging results that were available during my care of the patient were reviewed by me and considered in my medical decision making (see chart for details).    Patient presents today with generalized weakness. New onset renal failure, bladder scan reported normal by rn New anemia- 2 units prbcs ordered Ho etoh use- no evidence of withdrawal at this time     D.W. Dr Alcario Drought and will see for admission Final Clinical Impressions(s) / ED Diagnoses   Final diagnoses:  Acute renal failure, unspecified acute renal failure type [facility])    ED Discharge Orders    None       Pattricia Boss, MD 07/16/18 2043

## 2018-07-16 NOTE — ED Provider Notes (Signed)
Called by primary care provider provider the patient has new hemoglobin 5.3 and a creatinine of 5.43   Pattricia Boss, MD 07/16/18 1819

## 2018-07-16 NOTE — ED Triage Notes (Signed)
Pt's wife stated, his Dr. Hulen Skains and said, to get to the hospital he has abnormal labs.  Pt. Stated, Jeremy Grant been hurting in my joints, tired and cold all the time.

## 2018-07-16 NOTE — H&P (Signed)
History and Physical    Jeremy Grant JTT:[id number] DOB: August 01, 1960 DOA: 07/16/2018  PCP: Leonard Downing, MD  Patient coming from: Home  I have personally briefly reviewed patient's old medical records in [place]  Chief Complaint: Anemia, AKF  HPI: Jeremy Grant is a 58 y.o. male with medical history significant of HTN, EtOH abuse, NICM had EF 20% with grade 3 diastolic dysfunction back in 2015, this thankfully normalized with normal repeat echos in 2015 and 2017.  Patient presents today with new onset anemia and renal failure.  He was seen by Dr. Darron Doom, primary care physician, in her office yesterday.  She obtained labs and called him today and told to come to the ED.  Dr. Darron Doom told EDP that patient had HGB 5 and creat 5 and both were new since last seen x6 months ago in her office.  Patient states that he has had some generalized weakness.  He denies any headache, head injury, chest pain, dyspnea, abdominal pain, nausea, vomiting, diarrhea, or weight change.  Denies melena or hematochezia.  He states he has had some increased urine output.  He has been been taking his medications as prescribed.  Does have joint aches and pains over the past couple of months.  Additionally has significant cold intolerance.   ED Course: HGB 4.7, Creat 6.x BUN 120, bicarb 15, hemoccult negative.   Review of Systems: As per HPI otherwise 10 point review of systems negative.   Past Medical History:  Diagnosis Date  . Combined systolic and diastolic cardiac dysfunction    Echo 11/03/2013 EF 00%, grade 3 diastolic dysfunction  . Hypertension   . NICM (nonischemic cardiomyopathy) ([place])    a. L/RHC (11/05/13): RA: 3, RV 52/5, PA 49/19 (31), PCWP 10, AO 166/93, PA 67%, Fick CO/CI: 5.71/2.97, Lmain: normal, LAD: large, without signficant dz, first diagonal has 20% dz at ostium, LCx: normal, RCA: 30% stenosis at the bifurcation of PDA and PLOM    Past Surgical History:    Procedure Laterality Date  . LEFT AND RIGHT HEART CATHETERIZATION WITH CORONARY ANGIOGRAM N/A 11/05/2013   Procedure: LEFT AND RIGHT HEART CATHETERIZATION WITH CORONARY ANGIOGRAM;  Surgeon: Peter M Martinique, MD;  Location: [facility] CATH LAB;  Service: Cardiovascular;  Laterality: N/A;  . RIGHT HEART CATHETERIZATION N/A 01/14/2014   Procedure: RIGHT HEART CATH;  Surgeon: Larey Dresser, MD;  Location: [facility] CATH LAB;  Service: Cardiovascular;  Laterality: N/A;     reports that he has never smoked. He has never used smokeless tobacco. He reports current alcohol use of about 9.0 standard drinks of alcohol per week. He reports that he does not use drugs.  No Known Allergies  Family History  Problem Relation Age of Onset  . Heart attack Father   . Heart disease Father   . Arrhythmia Father   . Hypertension Brother   . Hypertension Brother      Prior to Admission medications   Medication Sig Start Date End Date Taking? Authorizing Provider  amLODipine (NORVASC) 10 MG tablet Take 1 tablet (10 mg total) by mouth daily. FINAL WARNING-NO FURTHER REFILLS UNTIL SEEN IN CHF CLINIC. 01/15/17   Larey Dresser, MD  carvedilol (COREG) 25 MG tablet Take 1 tablet (25 mg total) by mouth 2 (two) times daily with a meal. FINAL WARNING-NO FURTHER REFILLS UNTIL SEEN IN CHF CLINIC. 01/15/17   Larey Dresser, MD  furosemide (LASIX) 20 MG tablet Take for weight gain 3lbs day or 5 lbs in  a week.FINAL WARNING-NO FURTHER REFILLS UNTIL SEEN IN CHF CLINIC. 01/15/17   Larey Dresser, MD  hydrALAZINE (APRESOLINE) 100 MG tablet Take 1 tablet (100 mg total) by mouth 3 (three) times daily. FINAL WARNING-NO FURTHER REFILLS UNTIL SEEN IN CHF CLINIC. 01/15/17   Larey Dresser, MD  isosorbide mononitrate (IMDUR) 30 MG 24 hr tablet Take 1 tablet (30 mg total) by mouth 2 (two) times daily. FINAL WARNING-NO FURTHER REFILLS UNTIL SEEN IN CHF CLINIC. 01/15/17   Larey Dresser, MD  lisinopril (PRINIVIL,ZESTRIL) 10 MG tablet Take 1 tablet (10 mg  total) by mouth daily. 01/15/17 04/15/17  Larey Dresser, MD    Physical Exam: Vitals:   07/16/18 2000 07/16/18 2015 07/16/18 2030 07/16/18 2045  BP: (!) 145/73 (!) 141/77 (!) 145/69 (!) 154/63  Pulse: 70 69 70 73  Resp: 15 13 16 15   Temp:      TempSrc:      SpO2: 98% 98% 98% 98%  Weight:      Height:        Constitutional: NAD, calm, comfortable Eyes: PERRL, lids and conjunctivae normal ENMT: Mucous membranes are moist. Posterior pharynx clear of any exudate or lesions.Normal dentition.  Neck: normal, supple, no masses, no thyromegaly Respiratory: clear to auscultation bilaterally, no wheezing, no crackles. Normal respiratory effort. No accessory muscle use.  Cardiovascular: Regular rate and rhythm, no murmurs / rubs / gallops. No extremity edema. 2+ pedal pulses. No carotid bruits.  Abdomen: no tenderness, no masses palpated. No hepatosplenomegaly. Bowel sounds positive.  Musculoskeletal: no clubbing / cyanosis. No joint deformity upper and lower extremities. Good ROM, no contractures. Normal muscle tone.  Skin: no rashes, lesions, ulcers. No induration Neurologic: CN 2-12 grossly intact. Sensation intact, DTR normal. Strength 5/5 in all 4.  Psychiatric: Normal judgment and insight. Alert and oriented x 3. Normal mood.    Labs on Admission: I have personally reviewed following labs and imaging studies  CBC: Recent Labs  Lab 07/16/18 1836  WBC 2.8*  NEUTROABS 2.3  HGB 4.7*  HCT 15.2*  MCV 92.1  PLT 409*   Basic Metabolic Panel: Recent Labs  Lab 07/16/18 1836  NA 135  K 4.5  CL 106  CO2 15*  GLUCOSE 119*  BUN 121*  CREATININE 6.23*  CALCIUM 8.9   GFR: Estimated Creatinine Clearance: 11.7 mL/min (A) (by C-G formula based on SCr of 6.23 mg/dL (H)). Liver Function Tests: Recent Labs  Lab 07/16/18 1836  AST 16  ALT 19  ALKPHOS 44  BILITOT 0.4  PROT 7.2  ALBUMIN 3.3*   No results for input(s): LIPASE, AMYLASE in the last 168 hours. No results for  input(s): AMMONIA in the last 168 hours. Coagulation Profile: No results for input(s): INR, PROTIME in the last 168 hours. Cardiac Enzymes: No results for input(s): CKTOTAL, CKMB, CKMBINDEX, TROPONINI in the last 168 hours. BNP (last 3 results) No results for input(s): PROBNP in the last 8760 hours. HbA1C: No results for input(s): HGBA1C in the last 72 hours. CBG: No results for input(s): GLUCAP in the last 168 hours. Lipid Profile: No results for input(s): CHOL, HDL, LDLCALC, TRIG, CHOLHDL, LDLDIRECT in the last 72 hours. Thyroid Function Tests: No results for input(s): TSH, T4TOTAL, FREET4, T3FREE, THYROIDAB in the last 72 hours. Anemia Panel: No results for input(s): VITAMINB12, FOLATE, FERRITIN, TIBC, IRON, RETICCTPCT in the last 72 hours. Urine analysis: No results found for: COLORURINE, APPEARANCEUR, LABSPEC, [place], GLUCOSEU, [place], [place], [place], PROTEINUR, [place], NITRITE, [place]  Radiological Exams  on Admission: No results found.  EKG: Independently reviewed.  Assessment/Plan Principal Problem:   Acute kidney failure (HCC) Active Problems:   Malignant hypertension   NICM (nonischemic cardiomyopathy) (HCC)   History of ETOH abuse   Chronic combined systolic and diastolic CHF (congestive heart failure) (HCC)   Normocytic normochromic anemia    1. AKF - 1. DDx of cause includes: anemia, HTN nephropathy, SLE (including drug induced lupus), nephrotoxicity from ACEi, obstruction, and others. 2. US Renal (though bladder scan reportedly negative) 3. UA with microscopic 4. Urine lytes 5. ANA, anti-jo 6. Histone antibodies, hold hydralazine 7. Hold Lisinopril 8. Strict intake and output 9. Repeat BMP in AM 10. Consult nephrology in AM 11. Check TSH 2. Anemia - 1. DDx of cause includes: anemia of CKD, hemolytic anemia, doesn't seem to have evidence of acute blood loss 2. Anemia pnl 3. Direct antiglobulin test 4. erythropoietin  level 5. Transfuse 2u PRBC 6. Repeat CBC in AM 3. H/o EtOH abuse - 1. CIWA 4. HTN - 1. Continue BP meds except hydralazine and ACEi 5. Chronic CHF - 1. Seemed resolved as of 2017, no obvious recurrence at this time.  DVT prophylaxis: SCDs Code Status: Full Family Communication: Family at bedside Disposition Plan: Home after admit Consults called: None Admission status: Admit to inpatient  Severity of Illness: The appropriate patient status for this patient is INPATIENT. Inpatient status is judged to be reasonable and necessary in order to provide the required intensity of service to ensure the patient's safety. The patient's presenting symptoms, physical exam findings, and initial radiographic and laboratory data in the context of their chronic comorbidities is felt to place them at high risk for further clinical deterioration. Furthermore, it is not anticipated that the patient will be medically stable for discharge from the hospital within 2 midnights of admission. The following factors support the patient status of inpatient.   Patient with AKF, creat up to greater than 6 from a reportedly normal or near normal baseline.  Also HGB 4.7 down from a normal or near normal baseline.   * I certify that at the point of admission it is my clinical judgment that the patient will require inpatient hospital care spanning beyond 2 midnights from the point of admission due to high intensity of service, high risk for further deterioration and high frequency of surveillance required.*    Abdoulaye Drum M. DO [facility]  How to contact the [facility] Attending or Consulting provider [place] or covering provider during after hours [place], for this patient?  1. Check the care team in [facility] and look for a) attending/consulting [facility] provider listed and b) the [facility] team listed 2. Log into [email or web address]  Amion Physician Scheduling and messaging for groups and whole hospitals  On call and physician  scheduling software for group practices, residents, hospitalists and other medical providers for call, clinic, rotation and shift schedules. On[employer] is a hospital-wide system for scheduling doctors and paging doctors on call. EasyPlot is for scientific plotting and data analysis.  [email or web address]  and use [facility]'s universal password to access. If you do not have the password, please contact the hospital operator.  3. Locate the [facility] provider you are looking for under [facility] and page to a number that you can be directly reached. 4. If you still have difficulty reaching the provider, please page the [facility] (Director on Call) for the Hospitalists listed on amion for assistance.  07/16/2018, 8:55 PM

## 2018-07-16 NOTE — ED Notes (Signed)
ED TO INPATIENT HANDOFF REPORT  ED Nurse Name and Phone #: Donjuan Robison [phone]  S Name/Age/Gender Jeremy Grant 58 y.o. male Room/Bed: 031C/031C  Code Status   Code Status: Full Code  Home/SNF/Other Home Patient oriented to: self, place, time and situation Is this baseline? Yes   Triage Complete: Triage complete  Chief Complaint Abnormal Labs  Triage Note Pt's wife stated, his Dr. Hulen Skains and said, to get to the hospital he has abnormal labs.  Pt. Stated, [name] been hurting in my joints, tired and cold all the time.   Allergies No Known Allergies  Level of Care/Admitting Diagnosis ED Disposition    ED Disposition Condition [facility]: [place] [[id number]]  Level of Care: Medical Telemetry [104]  Diagnosis: Acute kidney failure [facility]) [[id number]]  Admitting Physician: Doreatha Massed  Attending Physician: Etta Quill [phone]  Estimated length of stay: past midnight tomorrow  Certification:: I certify this patient will need inpatient services for at least 2 midnights  PT Class (Do Not Modify): Inpatient [101]  PT Acc Code (Do Not Modify): Private [1]       B Medical/Surgery History Past Medical History:  Diagnosis Date  . Combined systolic and diastolic cardiac dysfunction    Echo 11/03/2013 EF 83%, grade 3 diastolic dysfunction  . Hypertension   . NICM (nonischemic cardiomyopathy) ([place])    a. L/RHC (11/05/13): RA: 3, RV 52/5, PA 49/19 (31), PCWP 10, AO 166/93, PA 67%, Fick CO/CI: 5.71/2.97, Lmain: normal, LAD: large, without signficant dz, first diagonal has 20% dz at ostium, LCx: normal, RCA: 30% stenosis at the bifurcation of PDA and PLOM   Past Surgical History:  Procedure Laterality Date  . LEFT AND RIGHT HEART CATHETERIZATION WITH CORONARY ANGIOGRAM N/A 11/05/2013   Procedure: LEFT AND RIGHT HEART CATHETERIZATION WITH CORONARY ANGIOGRAM;  Surgeon: Peter M Martinique, MD;  Location: [facility] CATH LAB;  Service: Cardiovascular;   Laterality: N/A;  . RIGHT HEART CATHETERIZATION N/A 01/14/2014   Procedure: RIGHT HEART CATH;  Surgeon: Larey Dresser, MD;  Location: [facility] CATH LAB;  Service: Cardiovascular;  Laterality: N/A;     A IV Location/Drains/Wounds Patient Lines/Drains/Airways Status   Active Line/Drains/Airways    Name:   Placement date:   Placement time:   Site:   Days:   Peripheral IV 07/16/18 Right;Upper Forearm   07/16/18    1956    Forearm   less than 1          Intake/Output Last 24 hours No intake or output data in the 24 hours ending 07/16/18 2059  Labs/Imaging Results for orders placed or performed during the hospital encounter of 07/16/18 (from the past 48 hour(s))  CBC with Differential     Status: Abnormal   Collection Time: 07/16/18  6:36 PM  Result Value Ref Range   WBC 2.8 (L) 4.0 - 10.5 K/uL   RBC 1.65 (L) 4.22 - 5.81 MIL/uL   Hemoglobin 4.7 (LL) 13.0 - 17.0 g/dL    Comment: REPEATED TO VERIFY THIS CRITICAL RESULT HAS VERIFIED AND BEEN CALLED TO S COOKE,RN BY DENNIS BRADLEY ON 03 04 2020 AT 1906, AND HAS BEEN READ BACK. READ BACK AND VERIFIED.    HCT 15.2 (L) 39.0 - 52.0 %   MCV 92.1 80.0 - 100.0 fL   MCH 28.5 26.0 - 34.0 pg   MCHC 30.9 30.0 - 36.0 g/dL   RDW 13.1 11.5 - 15.5 %   Platelets 124 (L) 150 -  400 K/uL    Comment: Immature Platelet Fraction may be clinically indicated, consider ordering this additional test [id number]    nRBC 0.0 0.0 - 0.2 %   Neutrophils Relative % 82 %   Neutro Abs 2.3 1.7 - 7.7 K/uL   Lymphocytes Relative 15 %   Lymphs Abs 0.4 (L) 0.7 - 4.0 K/uL   Monocytes Relative 3 %   Monocytes Absolute 0.1 0.1 - 1.0 K/uL   Eosinophils Relative 0 %   Eosinophils Absolute 0.0 0.0 - 0.5 K/uL   Basophils Relative 0 %   Basophils Absolute 0.0 0.0 - 0.1 K/uL   nRBC 0 0 /100 WBC   Abs Immature Granulocytes 0.00 0.00 - 0.07 K/uL    Comment: Performed at [facility] [address], [place], [place] [address]  Comprehensive metabolic panel     Status:  Abnormal   Collection Time: 07/16/18  6:36 PM  Result Value Ref Range   Sodium 135 135 - 145 mmol/L   Potassium 4.5 3.5 - 5.1 mmol/L   Chloride 106 98 - 111 mmol/L   CO2 15 (L) 22 - 32 mmol/L   Glucose, Bld 119 (H) 70 - 99 mg/dL   BUN 121 (H) 6 - 20 mg/dL   Creatinine, Ser 6.23 (H) 0.61 - 1.24 mg/dL   Calcium 8.9 8.9 - 10.3 mg/dL   Total Protein 7.2 6.5 - 8.1 g/dL   Albumin 3.3 (L) 3.5 - 5.0 g/dL   AST 16 15 - 41 U/L   ALT 19 0 - 44 U/L   Alkaline Phosphatase 44 38 - 126 U/L   Total Bilirubin 0.4 0.3 - 1.2 mg/dL   GFR calc non Af Amer 9 (L) >60 mL/min   GFR calc Af Amer 10 (L) >60 mL/min   Anion gap 14 5 - 15    Comment: Performed at [facility], [place] [address] [place], [place] [address]  Type and screen [place]     Status: None (Preliminary result)   Collection Time: 07/16/18  7:57 PM  Result Value Ref Range   ABO/RH(D) PENDING    Antibody Screen PENDING    Sample Expiration      07/19/2018 Performed at [facility], [place] [address] [place], [place] [address]   Prepare RBC     Status: None   Collection Time: 07/16/18  7:57 PM  Result Value Ref Range   Order Confirmation      ORDER PROCESSED BY BLOOD BANK Performed at [facility], [place] [address], [place], [place] [address]   POC occult blood, ED     Status: None   Collection Time: 07/16/18  8:30 PM  Result Value Ref Range   Fecal Occult Bld NEGATIVE NEGATIVE   No results found.  Pending Labs Unresulted Labs (From admission, onward)    Start     Ordered   07/17/18 0500  CBC  Tomorrow morning,   R     07/16/18 2026   07/17/18 9470  Basic metabolic panel  Tomorrow morning,   R     07/16/18 2026   07/16/18 2055  Histone antibodies, IgG, blood  Once,   R     07/16/18 2054   07/16/18 2054  Extractable Nuclear antigen ab  (Systemic Lupus Panel-Comprehesnive (PNL))  Once,   R     07/16/18 2053   07/16/18 2054  Anti-Jo 1 antibody, IgG  (Systemic Lupus Panel-Comprehesnive (PNL))   Once,  R     07/16/18 2053   07/16/18 2053  Urinalysis, Complete w Microscopic  Once,   R     07/16/18 2052   07/16/18 2051  TSH  Once,   R     07/16/18 2050   07/16/18 2036  HIV antibody (Routine Testing)  Once,   R     07/16/18 2037   07/16/18 2025  Creatinine, urine, random  Once,   R     07/16/18 2024   07/16/18 2024  Sodium, urine, random  Once,   R     07/16/18 2024   07/16/18 2022  Erythropoietin  Once,   R     07/16/18 2021   07/16/18 2021  Vitamin B12  (Anemia Panel (PNL))  Once,   R     07/16/18 2021   07/16/18 2021  Folate  (Anemia Panel (PNL))  Once,   R     07/16/18 2021   07/16/18 2021  Iron and TIBC  (Anemia Panel (PNL))  Once,   R     07/16/18 2021   07/16/18 2021  Ferritin  (Anemia Panel (PNL))  Once,   R     07/16/18 2021   07/16/18 2021  Reticulocytes  (Anemia Panel (PNL))  Once,   R     07/16/18 2021   07/16/18 1957  Occult blood card to lab, stool RN will collect  Once,   STAT    Question:  Specimen to be collected by?  Answer:  RN will collect   07/16/18 1956          Vitals/Pain Today's Vitals   07/16/18 2000 07/16/18 2015 07/16/18 2030 07/16/18 2045  BP: (!) 145/73 (!) 141/77 (!) 145/69 (!) 154/63  Pulse: 70 69 70 73  Resp: 15 13 16 15   Temp:      TempSrc:      SpO2: 98% 98% 98% 98%  Weight:      Height:      PainSc:        Isolation Precautions No active isolations  Medications Medications  0.9 %  sodium chloride infusion (Manually program via Guardrails IV Fluids) (has no administration in time range)  LORazepam (ATIVAN) tablet 1 mg (has no administration in time range)    Or  LORazepam (ATIVAN) injection 1 mg (has no administration in time range)  thiamine (VITAMIN B-1) tablet 100 mg (has no administration in time range)    Or  thiamine (B-1) injection 100 mg (has no administration in time range)  folic acid (FOLVITE) tablet 1 mg (has no administration in time range)  multivitamin with minerals tablet 1 tablet (has no  administration in time range)  acetaminophen (TYLENOL) tablet 650 mg (has no administration in time range)    Or  acetaminophen (TYLENOL) suppository 650 mg (has no administration in time range)  ondansetron (ZOFRAN) tablet 4 mg (has no administration in time range)    Or  ondansetron (ZOFRAN) injection 4 mg (has no administration in time range)    Mobility walks Low fall risk   Focused Assessments Cardiac Assessment Handoff:    Lab Results  Component Value Date   TROPONINI <0.30 11/03/2013   No results found for: DDIMER Does the Patient currently have chest pain? No     R Recommendations: See Admitting Provider Note  Report given to:   Additional Notes:

## 2018-07-17 ENCOUNTER — Encounter (HOSPITAL_COMMUNITY): Payer: Self-pay | Admitting: *Deleted

## 2018-07-17 ENCOUNTER — Inpatient Hospital Stay (HOSPITAL_COMMUNITY): Payer: BLUE CROSS/BLUE SHIELD

## 2018-07-17 ENCOUNTER — Other Ambulatory Visit: Payer: Self-pay

## 2018-07-17 DIAGNOSIS — D61818 Other pancytopenia: Secondary | ICD-10-CM

## 2018-07-17 LAB — CBC
HCT: 19.3 % — ABNORMAL LOW (ref 39.0–52.0)
HCT: 18.5 % — ABNORMAL LOW (ref 39.0–52.0)
Hemoglobin: 6.5 g/dL — CL (ref 13.0–17.0)
Hemoglobin: 6.4 g/dL — CL (ref 13.0–17.0)
MCH: 29.5 pg (ref 26.0–34.0)
MCH: 30.2 pg (ref 26.0–34.0)
MCHC: 33.7 g/dL (ref 30.0–36.0)
MCHC: 34.6 g/dL (ref 30.0–36.0)
MCV: 87.7 fL (ref 80.0–100.0)
MCV: 87.3 fL (ref 80.0–100.0)
Platelets: 113 10*3/uL — ABNORMAL LOW (ref 150–400)
Platelets: 91 10*3/uL — ABNORMAL LOW (ref 150–400)
RBC: 2.2 MIL/uL — ABNORMAL LOW (ref 4.22–5.81)
RBC: 2.12 MIL/uL — ABNORMAL LOW (ref 4.22–5.81)
RDW: 13.2 % (ref 11.5–15.5)
RDW: 13.6 % (ref 11.5–15.5)
WBC: 2.8 10*3/uL — ABNORMAL LOW (ref 4.0–10.5)
WBC: 2.9 10*3/uL — ABNORMAL LOW (ref 4.0–10.5)
nRBC: 0 % (ref 0.0–0.2)
nRBC: 0 % (ref 0.0–0.2)

## 2018-07-17 LAB — PREPARE RBC (CROSSMATCH)

## 2018-07-17 LAB — FOLATE: Folate: 51.9 ng/mL (ref >5.9)

## 2018-07-17 LAB — BASIC METABOLIC PANEL
Anion gap: 12 (ref 5–15)
BUN: 118 mg/dL — ABNORMAL HIGH (ref 6–20)
CO2: 17 mmol/L — ABNORMAL LOW (ref 22–32)
Calcium: 8.5 mg/dL — ABNORMAL LOW (ref 8.9–10.3)
Chloride: 109 mmol/L (ref 98–111)
Creatinine, Ser: 5.94 mg/dL — ABNORMAL HIGH (ref 0.61–1.24)
GFR calc Af Amer: 11 mL/min — ABNORMAL LOW (ref >60)
GFR calc non Af Amer: 10 mL/min — ABNORMAL LOW (ref >60)
GLUCOSE: 93 mg/dL (ref 70–99)
Potassium: 4 mmol/L (ref 3.5–5.1)
Sodium: 138 mmol/L (ref 135–145)

## 2018-07-17 LAB — SAVE SMEAR(SSMR), FOR PROVIDER SLIDE REVIEW

## 2018-07-17 LAB — HEMOGLOBIN AND HEMATOCRIT, BLOOD
HCT: 21.8 % — ABNORMAL LOW (ref 39.0–52.0)
HEMOGLOBIN: 7.2 g/dL — AB (ref 13.0–17.0)

## 2018-07-17 LAB — SODIUM, URINE, RANDOM: Sodium, Ur: 76 mmol/L

## 2018-07-17 LAB — CREATININE, URINE, RANDOM: Creatinine, Urine: 34.85 mg/dL

## 2018-07-17 LAB — HIV ANTIBODY (ROUTINE TESTING W REFLEX): HIV Screen 4th Generation wRfx: NONREACTIVE

## 2018-07-17 MED ORDER — SODIUM CHLORIDE 0.9% IV SOLUTION
Freq: Once | INTRAVENOUS | Status: AC
  Administered 2018-07-17: 12:00:00 via INTRAVENOUS

## 2018-07-17 MED ORDER — SODIUM BICARBONATE 650 MG PO TABS
650.0000 mg | ORAL_TABLET | Freq: Two times a day (BID) | ORAL | Status: DC
  Administered 2018-07-17 – 2018-07-18 (×3): 650 mg via ORAL
  Filled 2018-07-17 (×3): qty 1

## 2018-07-17 NOTE — Progress Notes (Signed)
PROGRESS NOTE  Jeremy Grant OIZ:[id number] DOB: [birth year] DOA: 07/16/2018 PCP: Hayden Rasmussen, MD  HPI/Recap of past 24 hours:  He denies pain, no sob, no edema No overt bleeding  Assessment/Plan: Principal Problem:   Acute kidney failure ([place]) Active Problems:   Malignant hypertension   NICM (nonischemic cardiomyopathy) ([facility])   History of ETOH abuse   Chronic combined systolic and diastolic CHF (congestive heart failure) ([facility])   Normocytic normochromic anemia  AKI -unclear etiology , euvolemic with good urine output, no obstruction by renal US, ua + rbc, no gross hematuria -broad differential , nephrology consulted  Pancytopenia Anemia hgb 4.7 on presentation, hgb 6.5 after two units prbc, will give another unit prbc FOBT negative, he denies hematuria, no overt sign of bleeding, tbili 0.4 spep and light chain study ordered,  Get smear Consider hematology consult  H/o nonischemic cardiomyopathy, possible due to combination of uncontrolled HTN and ETOH -ef has normalized, he report quit drinking, he reports bp normal runs around 150's at home, he run out two of the bp meds, he has been on hydralazine and coreg at home. He stop taking lasix for tow yrs after ef normalized. Hydralazine held due to concerns of drug induced SLE (ana and antihistone ab ordered)  unintentional weight loss by reports Body mass index is 18 kg/m.   Code Status: full  Family Communication: patient   Disposition Plan: not ready to discharge   Consultants:  nephrology  Procedures:  prbc transfusion  Antibiotics:  none   Objective: BP 140/64 (BP Location: Right Arm)   Pulse 68   Temp 98 F (36.7 C) (Oral)   Resp 18   Ht 6\' 1"  (1.854 m)   Wt 61.9 kg   SpO2 98%   BMI 18.00 kg/m   Intake/Output Summary (Last 24 hours) at 07/17/2018 1033 Last data filed at 07/17/2018 0737 Gross per 24 hour  Intake 681 ml  Output 2175 ml  Net -1494 ml   Filed Weights   07/16/18 1810  07/16/18 2144  Weight: 64 kg 61.9 kg    Exam: Patient is examined daily including today on 07/17/2018, exams remain the same as of yesterday except that has changed    General:  Pale, NAD  Cardiovascular: RRR  Respiratory: CTABL  Abdomen: Soft/ND/NT, positive BS  Musculoskeletal: No Edema  Neuro: alert, oriented   Data Reviewed: Basic Metabolic Panel: Recent Labs  Lab 07/16/18 1836 07/17/18 0539  NA 135 138  K 4.5 4.0  CL 106 109  CO2 15* 17*  GLUCOSE 119* 93  BUN 121* 118*  CREATININE 6.23* 5.94*  CALCIUM 8.9 8.5*   Liver Function Tests: Recent Labs  Lab 07/16/18 1836  AST 16  ALT 19  ALKPHOS 44  BILITOT 0.4  PROT 7.2  ALBUMIN 3.3*   No results for input(s): LIPASE, AMYLASE in the last 168 hours. No results for input(s): AMMONIA in the last 168 hours. CBC: Recent Labs  Lab 07/16/18 1836 07/17/18 0539 07/17/18 0801  WBC 2.8* 2.8* 2.9*  NEUTROABS 2.3  --   --   HGB 4.7* 6.5* 6.4*  HCT 15.2* 19.3* 18.5*  MCV 92.1 87.7 87.3  PLT 124* 113* 91*   Cardiac Enzymes:   No results for input(s): CKTOTAL, CKMB, CKMBINDEX, TROPONINI in the last 168 hours. BNP (last 3 results) No results for input(s): BNP in the last 8760 hours.  ProBNP (last 3 results) No results for input(s): PROBNP in the last 8760 hours.  CBG: No results  for input(s): GLUCAP in the last 168 hours.  No results found for this or any previous visit (from the past 240 hour(s)).   Studies: US Renal  Result Date: 07/17/2018 CLINICAL DATA:  Acute renal failure EXAM: RENAL / URINARY TRACT ULTRASOUND COMPLETE COMPARISON:  None. FINDINGS: Right Kidney: Renal measurements: 12.5 x 5.2 x 5.6 cm = volume: 191.9 mL. Mild increased renal cortical echogenicity. No hydronephrosis. Left Kidney: Renal measurements: 13.2 x 6.2 x 6.6 cm = volume: 281.2 mL. Mild increased renal cortical echogenicity. No hydronephrosis. Bladder: Appears normal for degree of bladder distention. IMPRESSION: No hydronephrosis.  Mild increased renal cortical echogenicity as can be seen with chronic medical renal disease. Electronically Signed   By: Lovey Newcomer M.D.   On: 07/17/2018 09:29   Dg Chest Port 1 View  Result Date: 07/16/2018 CLINICAL DATA:  Weakness EXAM: PORTABLE CHEST 1 VIEW COMPARISON:  06/22/2015 FINDINGS: Cardiomegaly. There may be patchy ground-glass opacity in the left mid lung and right infrahilar lung. No pneumothorax. IMPRESSION: 1. Suspected ground-glass infiltrates in the left mid lung and right infrahilar lung. 2. Mild cardiomegaly Electronically Signed   By: Donavan Foil M.D.   On: 07/16/2018 21:20    Scheduled Meds: . sodium chloride   Intravenous Once  . sodium chloride   Intravenous Once  . folic acid  1 mg Oral Daily  . multivitamin with minerals  1 tablet Oral Daily  . thiamine  100 mg Oral Daily   Or  . thiamine  100 mg Intravenous Daily    Continuous Infusions:   Time spent: 4mins I have personally reviewed and interpreted on  07/17/2018 daily labs, tele strips, imagings as discussed above under date review session and assessment and plans.  I reviewed all nursing notes, pharmacy notes, consultant notes,  vitals, pertinent old records  I have discussed plan of care as described above with RN , patient on 07/17/2018   Florencia Reasons MD, PhD  [facility] Pager [phone]. If 7PM-7AM, please contact night-coverage at [email or web address], password [email or web address] 07/17/2018, 10:33 AM  LOS: 1 day

## 2018-07-17 NOTE — Consult Note (Signed)
[facility]  HISTORY AND PHYSICAL  Jeremy Grant is an 58 y.o. male.    Reason for Consultation: severe AKI Requesting Provider:  Dr. Erlinda Hong  HPI: 58yo Caucasian man with arthritis, h/o HFrEF (last TTE 2017 with EF 60% was 20% in 2015), HTN, EtOH abuse who presented to the ED last PM at the direction of his PCP for evaluation of Cr 5, Hb 5.  Per report both were normal 6 months ago at last check up.  By review last Cr in system 2017 was 1.3.   Recently he's had some generalized weakness, fatigue, pain in joints of hands and feet, weight loss (10lbs) despite good appetite, drenching night sweats - developing over 2-3 months.  Works long shifts as Software engineer (10hrs) and thought was related.  Denies any hemoptysis, oral ulcers, rashes, joint effusions, change in stools. Did have viral bronchitis about 1 month ago but no hemoptysis at that time either.  Urine volume and character normal, no edema. No NSAID use.  BP on home meds generally in the 130-140s, recently more in the 110s.  Drinks wine daily but hasn't for 4-5 days due to biannual PCP check up this week - was abstaining in prep for labs.  Wife thinks urine looks brown since admission last PM but didn't notice this before.   No visible hematuria.  He has pancytopenia (though 2017 CBC not vastly different aside from Hb), BUN ~120, Cr 6.  Hemoccult negative, Hb 4.7. Ultrasound R 12.5, L 13.2 mild inc echogenicity.  No hydronephrosis.   UA with 3+ blood, microscopy with >50RBC/hpf.  UOP 1.4L yesterday, 1.3 already today (by afternoon). CXR with infiltrates L mid lung and R infrahilar lung.   Treated with 2u pRBC with rise in Hb into 6s, another unit planned.   His hydralazine is held (c/f drug induced SLE), ACE-I is held but in talking with them he has been off this for a while.    PMH: Past Medical History:  Diagnosis Date  . Arthritis   . CHF (congestive heart failure) ([place])   . Combined systolic and diastolic cardiac  dysfunction    Echo 11/03/2013 EF 40%, grade 3 diastolic dysfunction  . Hypertension   . NICM (nonischemic cardiomyopathy) ([place])    a. L/RHC (11/05/13): RA: 3, RV 52/5, PA 49/19 (31), PCWP 10, AO 166/93, PA 67%, Fick CO/CI: 5.71/2.97, Lmain: normal, LAD: large, without signficant dz, first diagonal has 20% dz at ostium, LCx: normal, RCA: 30% stenosis at the bifurcation of PDA and PLOM   PSH: Past Surgical History:  Procedure Laterality Date  . LEFT AND RIGHT HEART CATHETERIZATION WITH CORONARY ANGIOGRAM N/A 11/05/2013   Procedure: LEFT AND RIGHT HEART CATHETERIZATION WITH CORONARY ANGIOGRAM;  Surgeon: Peter M Martinique, MD;  Location: [facility] CATH LAB;  Service: Cardiovascular;  Laterality: N/A;  . RIGHT HEART CATHETERIZATION N/A 01/14/2014   Procedure: RIGHT HEART CATH;  Surgeon: Larey Dresser, MD;  Location: [facility] CATH LAB;  Service: Cardiovascular;  Laterality: N/A;     Past Medical History:  Diagnosis Date  . Arthritis   . CHF (congestive heart failure) ([place])   . Combined systolic and diastolic cardiac dysfunction    Echo 11/03/2013 EF 10%, grade 3 diastolic dysfunction  . Hypertension   . NICM (nonischemic cardiomyopathy) ([place])    a. L/RHC (11/05/13): RA: 3, RV 52/5, PA 49/19 (31), PCWP 10, AO 166/93, PA 67%, Fick CO/CI: 5.71/2.97, Lmain: normal, LAD: large, without signficant dz, first diagonal has 20% dz at ostium,  LCx: normal, RCA: 30% stenosis at the bifurcation of PDA and PLOM    Medications:  I have reviewed the patient's current medications.  Just folate and thiamine.  Medications Prior to Admission  Medication Sig Dispense Refill  . amLODipine (NORVASC) 10 MG tablet Take 1 tablet (10 mg total) by mouth daily. FINAL WARNING-NO FURTHER REFILLS UNTIL SEEN IN CHF CLINIC. (Patient taking differently: Take 10 mg by mouth daily. ) 90 tablet 0  . carvedilol (COREG) 25 MG tablet Take 1 tablet (25 mg total) by mouth 2 (two) times daily with a meal. FINAL WARNING-NO FURTHER REFILLS UNTIL SEEN IN  CHF CLINIC. (Patient taking differently: Take 25 mg by mouth 2 (two) times daily with a meal. ) 180 tablet 0  . hydrALAZINE (APRESOLINE) 100 MG tablet Take 1 tablet (100 mg total) by mouth 3 (three) times daily. FINAL WARNING-NO FURTHER REFILLS UNTIL SEEN IN CHF CLINIC. (Patient taking differently: Take 100 mg by mouth 3 (three) times daily. ) 270 tablet 0  . isosorbide mononitrate (IMDUR) 30 MG 24 hr tablet Take 1 tablet (30 mg total) by mouth 2 (two) times daily. FINAL WARNING-NO FURTHER REFILLS UNTIL SEEN IN CHF CLINIC. (Patient taking differently: Take 30 mg by mouth 2 (two) times daily. ) 60 tablet 6  . furosemide (LASIX) 20 MG tablet Take for weight gain 3lbs day or 5 lbs in a week.FINAL WARNING-NO FURTHER REFILLS UNTIL SEEN IN CHF CLINIC. (Patient not taking: Reported on 07/16/2018) 90 tablet 0    ALLERGIES:  No Known Allergies  FAM HX: Family History  Problem Relation Age of Onset  . Heart attack Father   . Heart disease Father   . Arrhythmia Father   . Hypertension Brother   . Hypertension Brother     Social History:   reports that he has never smoked. He has never used smokeless tobacco. He reports current alcohol use of about 3.0 standard drinks of alcohol per week. He reports that he does not use drugs.  ROS: 12 system ROS per HPI above  Blood pressure 139/68, pulse (!) 59, temperature 98.4 F (36.9 C), temperature source Oral, resp. rate 14, height 6\' 1"  (1.854 m), weight 61.9 kg, SpO2 99 %. PHYSICAL EXAM: Gen: thin man comfortable at 45 degrees in bed Eyes: anicteric, EOMI, glasses ENT: Fair dentition, no OP lesions, MMM Neck: supple, no nodes palpable CV: RRR Lungs: clear to bases Extr: no edema MSK: no synovitis or joint effusion,  MCPs feel prominent but not spongy Skin: no rashes, chronic discoloration of LE (since heart failure), 1 mole that was removed by PCP this week with steri strip in place on bad Neuro: nonfocal   Results for orders placed or performed  during the hospital encounter of 07/16/18 (from the past 48 hour(s))  CBC with Differential     Status: Abnormal   Collection Time: 07/16/18  6:36 PM  Result Value Ref Range   WBC 2.8 (L) 4.0 - 10.5 K/uL   RBC 1.65 (L) 4.22 - 5.81 MIL/uL   Hemoglobin 4.7 (LL) 13.0 - 17.0 g/dL    Comment: REPEATED TO VERIFY THIS CRITICAL RESULT HAS VERIFIED AND BEEN CALLED TO S COOKE,RN BY DENNIS BRADLEY ON 03 04 2020 AT 1906, AND HAS BEEN READ BACK. READ BACK AND VERIFIED.    HCT 15.2 (L) 39.0 - 52.0 %   MCV 92.1 80.0 - 100.0 fL   MCH 28.5 26.0 - 34.0 pg   MCHC 30.9 30.0 - 36.0 g/dL   RDW 13.1  11.5 - 15.5 %   Platelets 124 (L) 150 - 400 K/uL    Comment: Immature Platelet Fraction may be clinically indicated, consider ordering this additional test [id number]    nRBC 0.0 0.0 - 0.2 %   Neutrophils Relative % 82 %   Neutro Abs 2.3 1.7 - 7.7 K/uL   Lymphocytes Relative 15 %   Lymphs Abs 0.4 (L) 0.7 - 4.0 K/uL   Monocytes Relative 3 %   Monocytes Absolute 0.1 0.1 - 1.0 K/uL   Eosinophils Relative 0 %   Eosinophils Absolute 0.0 0.0 - 0.5 K/uL   Basophils Relative 0 %   Basophils Absolute 0.0 0.0 - 0.1 K/uL   nRBC 0 0 /100 WBC   Abs Immature Granulocytes 0.00 0.00 - 0.07 K/uL    Comment: Performed at [facility], [place] [address], [place], [place] [address]  Comprehensive metabolic panel     Status: Abnormal   Collection Time: 07/16/18  6:36 PM  Result Value Ref Range   Sodium 135 135 - 145 mmol/L   Potassium 4.5 3.5 - 5.1 mmol/L   Chloride 106 98 - 111 mmol/L   CO2 15 (L) 22 - 32 mmol/L   Glucose, Bld 119 (H) 70 - 99 mg/dL   BUN 121 (H) 6 - 20 mg/dL   Creatinine, Ser 6.23 (H) 0.61 - 1.24 mg/dL   Calcium 8.9 8.9 - 10.3 mg/dL   Total Protein 7.2 6.5 - 8.1 g/dL   Albumin 3.3 (L) 3.5 - 5.0 g/dL   AST 16 15 - 41 U/L   ALT 19 0 - 44 U/L   Alkaline Phosphatase 44 38 - 126 U/L   Total Bilirubin 0.4 0.3 - 1.2 mg/dL   GFR calc non Af Amer 9 (L) >60 mL/min   GFR calc Af Amer 10 (L) >60 mL/min    Anion gap 14 5 - 15    Comment: Performed at [facility], [place] [address] [place], [place] [address]  Type and screen [place]     Status: None (Preliminary result)   Collection Time: 07/16/18  7:57 PM  Result Value Ref Range   ABO/RH(D) O NEG    Antibody Screen NEG    Sample Expiration 07/19/2018    Unit Number [id number]    Blood Component Type RED CELLS,LR    Unit division 00    Status of Unit ISSUED,FINAL    Transfusion Status OK TO TRANSFUSE    Crossmatch Result Compatible    Unit Number [id number]    Blood Component Type RED CELLS,LR    Unit division 00    Status of Unit ISSUED    Transfusion Status OK TO TRANSFUSE    Crossmatch Result Compatible    Unit Number [id number]    Blood Component Type RED CELLS,LR    Unit division 00    Status of Unit ISSUED    Transfusion Status OK TO TRANSFUSE    Crossmatch Result      Compatible Performed at [facility], [place] [address], [place], [place] [address]   Prepare RBC     Status: None   Collection Time: 07/16/18  7:57 PM  Result Value Ref Range   Order Confirmation      ORDER PROCESSED BY BLOOD BANK Performed at [facility], [place] [address] [place],  [address]   Direct antiglobulin test (not at [facility])     Status: None   Collection Time: 07/16/18  7:57  PM  Result Value Ref Range   DAT, complement NEG    DAT, IgG      NEG Performed at [place] [address] [place], [place] [address]   ABO/Rh     Status: None   Collection Time: 07/16/18  7:57 PM  Result Value Ref Range   ABO/RH(D)      O NEG Performed at [place] [address], [place], [place] [address]   Vitamin B12     Status: None   Collection Time: 07/16/18  8:21 PM  Result Value Ref Range   Vitamin B-12 567 180 - 914 pg/mL    Comment: (NOTE) This assay is not validated for testing neonatal or myeloproliferative syndrome specimens for Vitamin B12 levels. Performed at [facility], [place] [address] [place], [place] [address]   Folate     Status: None   Collection Time: 07/16/18  8:21 PM  Result Value Ref Range   Folate 51.9 >5.9 ng/mL    Comment: RESULTS CONFIRMED BY MANUAL DILUTION Performed at [facility], [place] [address], [place], [place] [address]   Iron and TIBC     Status: Abnormal   Collection Time: 07/16/18  8:21 PM  Result Value Ref Range   Iron 29 (L) 45 - 182 ug/dL   TIBC 298 250 - 450 ug/dL   Saturation Ratios 10 (L) 17.9 - 39.5 %   UIBC 269 ug/dL    Comment: Performed at [facility], [place] [address] [place], [place] [address]  Ferritin     Status: None   Collection Time: 07/16/18  8:21 PM  Result Value Ref Range   Ferritin 108 24 - 336 ng/mL    Comment: Performed at [facility], [place] [address] [place], [place] [address]  Reticulocytes     Status: Abnormal   Collection Time: 07/16/18  8:21 PM  Result Value Ref Range   Retic Ct Pct 2.0 0.4 - 3.1 %   RBC. 1.68 (L) 4.22 - 5.81 MIL/uL   Retic Count, Absolute 32.9 19.0 - 186.0 K/uL   Immature Retic Fract 5.3 2.3 - 15.9 %    Comment: Performed at [place] [address] [place], [place] [address]  POC occult blood, ED     Status: None   Collection Time: 07/16/18  8:30 PM  Result Value Ref Range   Fecal Occult Bld NEGATIVE NEGATIVE  HIV antibody (Routine Testing)     Status: None   Collection Time: 07/16/18  8:36 PM  Result Value Ref Range   HIV Screen 4th Generation wRfx Non Reactive Non Reactive    Comment: (NOTE) Performed At: [facility] [place], [place] [id number] Rush Farmer MD QQ:[id number]   TSH     Status: None   Collection Time: 07/16/18  8:51 PM  Result Value Ref Range   TSH 4.043 0.350 - 4.500 uIU/mL    Comment: Performed by a 3rd Generation assay with a functional sensitivity of <=0.01 uIU/mL. Performed at [facility], [place] [address] [place], [place] [address]   Lactate dehydrogenase     Status: None    Collection Time: 07/16/18  9:58 PM  Result Value Ref Range   LDH 158 98 - 192 U/L    Comment: Performed at [facility], [place] [address], [place], [place] [address]  Urinalysis, Complete w Microscopic     Status: Abnormal  Collection Time: 07/16/18 10:54 PM  Result Value Ref Range   Color, Urine YELLOW YELLOW   APPearance HAZY (A) CLEAR   Specific Gravity, Urine 1.009 1.005 - 1.030   pH 5.0 5.0 - 8.0   Glucose, UA NEGATIVE NEGATIVE mg/dL   Hgb urine dipstick LARGE (A) NEGATIVE   Bilirubin Urine NEGATIVE NEGATIVE   Ketones, ur NEGATIVE NEGATIVE mg/dL   Protein, ur 30 (A) NEGATIVE mg/dL   Nitrite NEGATIVE NEGATIVE   Leukocytes,Ua NEGATIVE NEGATIVE   RBC / HPF >50 (H) 0 - 5 RBC/hpf   WBC, UA 0-5 0 - 5 WBC/hpf   Bacteria, UA RARE (A) NONE SEEN   Mucus PRESENT     Comment: Performed at [facility], [address] [place] [address]  CBC     Status: Abnormal   Collection Time: 07/17/18  5:39 AM  Result Value Ref Range   WBC 2.8 (L) 4.0 - 10.5 K/uL   RBC 2.20 (L) 4.22 - 5.81 MIL/uL   Hemoglobin 6.5 (LL) 13.0 - 17.0 g/dL    Comment: This critical result has verified and been called to [facility] by Jearld Pies on 03 05 2020 at 0602, and has been read back.  REPEATED TO VERIFY POST TRANSFUSION SPECIMEN CORRECTED ON 03/05 AT 1326: PREVIOUSLY REPORTED AS 6.5 This critical result has verified and been called to [facility] by Jearld Pies on 03 05 2020 at 0602, and has been read back.  REPEATED TO VERIFY, CORRECTED ON 03/05 AT 0622: PREVIOUSLY REPORTED  AS 6.5 This critical result has verified and been called to [facility] by Jearld Pies on 03 05 2020 at 0602, and has been read back.     HCT 19.3 (L) 39.0 - 52.0 %   MCV 87.7 80.0 - 100.0 fL   MCH 29.5 26.0 - 34.0 pg   MCHC 33.7 30.0 - 36.0 g/dL   RDW 13.2 11.5 - 15.5 %    Comment: REPEATED TO VERIFY PLATELET COUNT CONFIRMED BY SMEAR    Platelets 113 (L) 150 - 400 K/uL    Comment: Immature Platelet  Fraction may be clinically indicated, consider ordering this additional test [id number] REPEATED TO VERIFY PLATELET COUNT CONFIRMED BY SMEAR    nRBC 0.0 0.0 - 0.2 %    Comment: Performed at [facility], [place] [address] [place], [place] [address]  Basic metabolic panel     Status: Abnormal   Collection Time: 07/17/18  5:39 AM  Result Value Ref Range   Sodium 138 135 - 145 mmol/L    Comment: DELTA CHECK NOTED CORRECTED ON 03/05 AT 6301: PREVIOUSLY REPORTED AS 138    Potassium 4.0 3.5 - 5.1 mmol/L   Chloride 109 98 - 111 mmol/L   CO2 17 (L) 22 - 32 mmol/L   Glucose, Bld 93 70 - 99 mg/dL   BUN 118 (H) 6 - 20 mg/dL   Creatinine, Ser 5.94 (H) 0.61 - 1.24 mg/dL   Calcium 8.5 (L) 8.9 - 10.3 mg/dL   GFR calc non Af Amer 10 (L) >60 mL/min   GFR calc Af Amer 11 (L) >60 mL/min   Anion gap 12 5 - 15    Comment: Performed at [place] [address] [place] [address]  CBC     Status: Abnormal   Collection Time: 07/17/18  8:01 AM  Result Value Ref Range   WBC 2.9 (L) 4.0 - 10.5 K/uL   RBC 2.12 (L) 4.22 - 5.81 MIL/uL  Hemoglobin 6.4 (LL) 13.0 - 17.0 g/dL    Comment: REPEATED TO VERIFY THIS CRITICAL RESULT HAS VERIFIED AND BEEN CALLED TO RN Cheshire G BY MESSAN HOUEGNIFIO ON 03 05 2020 AT 5409, AND HAS BEEN READ BACK.     HCT 18.5 (L) 39.0 - 52.0 %   MCV 87.3 80.0 - 100.0 fL   MCH 30.2 26.0 - 34.0 pg   MCHC 34.6 30.0 - 36.0 g/dL   RDW 13.6 11.5 - 15.5 %   Platelets 91 (L) 150 - 400 K/uL    Comment: REPEATED TO VERIFY SPECIMEN CHECKED FOR CLOTS Immature Platelet Fraction may be clinically indicated, consider ordering this additional test [id number] CONSISTENT WITH PREVIOUS RESULT    nRBC 0.0 0.0 - 0.2 %    Comment: Performed at [facility], [place] [address], [place], [place] [address]  Prepare RBC     Status: None   Collection Time: 07/17/18 10:36 AM  Result Value Ref Range   Order Confirmation      ORDER PROCESSED BY BLOOD BANK Performed at [facility], [place] [address], [place], [place] [address]     US Renal  Result Date: 07/17/2018 CLINICAL DATA:  Acute renal failure EXAM: RENAL / URINARY TRACT ULTRASOUND COMPLETE COMPARISON:  None. FINDINGS: Right Kidney: Renal measurements: 12.5 x 5.2 x 5.6 cm = volume: 191.9 mL. Mild increased renal cortical echogenicity. No hydronephrosis. Left Kidney: Renal measurements: 13.2 x 6.2 x 6.6 cm = volume: 281.2 mL. Mild increased renal cortical echogenicity. No hydronephrosis. Bladder: Appears normal for degree of bladder distention. IMPRESSION: No hydronephrosis. Mild increased renal cortical echogenicity as can be seen with chronic medical renal disease. Electronically Signed   By: Lovey Newcomer M.D.   On: 07/17/2018 09:29   Dg Chest Port 1 View  Result Date: 07/16/2018 CLINICAL DATA:  Weakness EXAM: PORTABLE CHEST 1 VIEW COMPARISON:  06/22/2015 FINDINGS: Cardiomegaly. There may be patchy ground-glass opacity in the left mid lung and right infrahilar lung. No pneumothorax. IMPRESSION: 1. Suspected ground-glass infiltrates in the left mid lung and right infrahilar lung. 2. Mild cardiomegaly Electronically Signed   By: Donavan Foil M.D.   On: 07/16/2018 21:20    Assessment/Plan **Severe AKI:  Nonoliguric, no uremic symptoms.  Renal US unrevealing except suggests this is a more acute process. Joint aches, hematuria concerning for GN.    Pending evaluation includes ANA, antiJo, antihistone.  Will add C3, C4, antiGBM, ANCA, ASO.  Pancytopenia, night sweats and weight loss c/f malignancy, pending K/L FLC, SPEP. Check UPEP.  Will also check phos and PTH.  He appears euvolemic and I don't think IV hydration would be beneficial.  Pending the above tests a renal biopsy may be necessary.  I think if things are improving or elucidated this could be strongly considered for Monday. I have discussed with he and wife.   **Anemia/pancytopenia:  Direct coombs neg, haptoglobin normal, LHD, epo level pending.  Hemoccult neg. Ferritin normal, iron sat 10%, iron level 29. Agree with myeloma w/u, may need hematology involvement.   **non anion gap metabolic acidosis: secondary to renal failure, supplement with po bicarb  **HTN:  Has been relatively normotensive since admission; ACE-I and hydralazine on hold - agree.  Follow, add meds as needed.   **EtOH use: CIWA protocol, thiamine, folate, MVI  Justin Mend 07/17/2018, 2:21 PM

## 2018-07-17 NOTE — Care Management Note (Signed)
Case Management Note  Patient Details  Name: Jeremy Grant MRN: [id number] Date of Birth: 07-23-1960  Subjective/Objective:         AKI and anemia           Action/Plan:  Spoke to patient and wife at bedside, anticipate patient to return to home w wife at DC. Patient is employed full time, works 10 hour days at [employer], drives and is independent. He has private insurance and prescription drug coverage. PCP is Horald Pollen. No CM identified at this time.    Expected Discharge Date:  07/20/18               Expected Discharge Plan:  Home/Self Care  In-House Referral:     Discharge planning Services  CM Consult  Post Acute Care Choice:    Choice offered to:     DME Arranged:    DME Agency:     HH Arranged:    HH Agency:     Status of Service:  In process, will continue to follow  If discussed at Long Length of Stay Meetings, dates discussed:    Additional Comments:  Carles Collet, RN 07/17/2018, 10:49 AM

## 2018-07-18 LAB — CBC WITH DIFFERENTIAL/PLATELET
ABS IMMATURE GRANULOCYTES: 0.01 10*3/uL (ref 0.00–0.07)
Basophils Absolute: 0 10*3/uL (ref 0.0–0.1)
Basophils Relative: 0 %
Eosinophils Absolute: 0 10*3/uL (ref 0.0–0.5)
Eosinophils Relative: 1 %
HCT: 22.8 % — ABNORMAL LOW (ref 39.0–52.0)
Hemoglobin: 7.4 g/dL — ABNORMAL LOW (ref 13.0–17.0)
Immature Granulocytes: 0 %
LYMPHS ABS: 0.5 10*3/uL — AB (ref 0.7–4.0)
Lymphocytes Relative: 15 %
MCH: 28.7 pg (ref 26.0–34.0)
MCHC: 32.5 g/dL (ref 30.0–36.0)
MCV: 88.4 fL (ref 80.0–100.0)
Monocytes Absolute: 0.2 10*3/uL (ref 0.1–1.0)
Monocytes Relative: 6 %
Neutro Abs: 2.6 10*3/uL (ref 1.7–7.7)
Neutrophils Relative %: 78 %
Platelets: 112 10*3/uL — ABNORMAL LOW (ref 150–400)
RBC: 2.58 MIL/uL — ABNORMAL LOW (ref 4.22–5.81)
RDW: 13.8 % (ref 11.5–15.5)
WBC: 3.3 10*3/uL — ABNORMAL LOW (ref 4.0–10.5)
nRBC: 0 % (ref 0.0–0.2)

## 2018-07-18 LAB — TYPE AND SCREEN
ABO/RH(D): O NEG
Antibody Screen: NEGATIVE
UNIT DIVISION: 0
Unit division: 0
Unit division: 0

## 2018-07-18 LAB — PROTEIN, URINE, 24 HOUR
Collection Interval-UPROT: 24 hours
PROTEIN, URINE: 71 mg/dL
Protein, 24H Urine: 1917 mg/d — ABNORMAL HIGH (ref 50–100)
Urine Total Volume-UPROT: 2700 mL

## 2018-07-18 LAB — HAPTOGLOBIN: Haptoglobin: 113 mg/dL (ref 29–370)

## 2018-07-18 LAB — HIV ANTIBODY (ROUTINE TESTING W REFLEX): HIV Screen 4th Generation wRfx: NONREACTIVE

## 2018-07-18 LAB — RENAL FUNCTION PANEL
Albumin: 3 g/dL — ABNORMAL LOW (ref 3.5–5.0)
Anion gap: 12 (ref 5–15)
BUN: 115 mg/dL — ABNORMAL HIGH (ref 6–20)
CO2: 17 mmol/L — ABNORMAL LOW (ref 22–32)
CREATININE: 6.3 mg/dL — AB (ref 0.61–1.24)
Calcium: 9 mg/dL (ref 8.9–10.3)
Chloride: 110 mmol/L (ref 98–111)
GFR calc Af Amer: 10 mL/min — ABNORMAL LOW (ref >60)
GFR calc non Af Amer: 9 mL/min — ABNORMAL LOW (ref >60)
GLUCOSE: 101 mg/dL — AB (ref 70–99)
Phosphorus: 6.3 mg/dL — ABNORMAL HIGH (ref 2.5–4.6)
Potassium: 4 mmol/L (ref 3.5–5.1)
Sodium: 139 mmol/L (ref 135–145)

## 2018-07-18 LAB — BPAM RBC
Blood Product Expiration Date: 202003082359
Blood Product Expiration Date: 202003082359
Blood Product Expiration Date: 202003082359
ISSUE DATE / TIME: 202003042243
ISSUE DATE / TIME: 202003050154
ISSUE DATE / TIME: 202003051121
UNIT TYPE AND RH: 9500
UNIT TYPE AND RH: 9500
Unit Type and Rh: 9500

## 2018-07-18 LAB — ANTI-JO 1 ANTIBODY, IGG: Anti JO-1: <0.2 AI (ref 0.0–0.9)

## 2018-07-18 LAB — NA AND K (SODIUM & POTASSIUM), RAND UR
POTASSIUM UR: 15 mmol/L
Sodium, Ur: 69 mmol/L

## 2018-07-18 LAB — EXTRACTABLE NUCLEAR ANTIGEN ANTIBODY
DS DNA AB: 8 [IU]/mL (ref 0–9)
ENA SM Ab Ser-aCnc: <0.2 AI (ref 0.0–0.9)
Ribonucleic Protein: 0.4 AI (ref 0.0–0.9)
SSA (RO) (ENA) ANTIBODY, IGG: 0.4 AI (ref 0.0–0.9)
SSB (La) (ENA) Antibody, IgG: <0.2 AI (ref 0.0–0.9)
Scleroderma (Scl-70) (ENA) Antibody, IgG: <0.2 AI (ref 0.0–0.9)

## 2018-07-18 LAB — PROTEIN / CREATININE RATIO, URINE
Creatinine, Urine: 42.2 mg/dL
Protein Creatinine Ratio: 1.73 mg/mg{Cre} — ABNORMAL HIGH (ref 0.00–0.15)
Total Protein, Urine: 73 mg/dL

## 2018-07-18 LAB — LACTATE DEHYDROGENASE: LDH: 145 U/L (ref 98–192)

## 2018-07-18 LAB — ERYTHROPOIETIN: Erythropoietin: 15.8 m[IU]/mL (ref 2.6–18.5)

## 2018-07-18 LAB — PSA: Prostatic Specific Antigen: 0.39 ng/mL (ref 0.00–4.00)

## 2018-07-18 LAB — HISTONE ANTIBODIES, IGG, BLOOD: DNA-Histone: 8.4 Units — ABNORMAL HIGH (ref 0.0–0.9)

## 2018-07-18 MED ORDER — SODIUM CHLORIDE 0.9 % IV SOLN
125.0000 mg | Freq: Every day | INTRAVENOUS | Status: AC
  Administered 2018-07-18 – 2018-07-22 (×5): 125 mg via INTRAVENOUS
  Filled 2018-07-18 (×5): qty 10

## 2018-07-18 MED ORDER — SODIUM BICARBONATE 650 MG PO TABS
1300.0000 mg | ORAL_TABLET | Freq: Two times a day (BID) | ORAL | Status: DC
  Administered 2018-07-18 – 2018-07-28 (×20): 1300 mg via ORAL
  Filled 2018-07-18 (×20): qty 2

## 2018-07-18 NOTE — Progress Notes (Signed)
PROGRESS NOTE  ERIK BURKETT RFF:[id number] DOB: 01-18-1961 DOA: 07/16/2018 PCP: Hayden Rasmussen, MD  HPI/Recap of past 24 hours:  Feeling stronger after blood transfusion, He denies pain, no sob, no edema No overt bleeding  Assessment/Plan: Principal Problem:   Acute kidney failure ([place]) Active Problems:   Malignant hypertension   NICM (nonischemic cardiomyopathy) ([place])   History of ETOH abuse   Chronic combined systolic and diastolic CHF (congestive heart failure) ([facility])   Normocytic normochromic anemia   Pancytopenia ([facility])  AKI/metabolic acidosis -unclear etiology , euvolemic with good urine output, no obstruction by renal US, ua + rbc, no gross hematuria -broad differential , nephrology consulted, input appreciated  Pancytopenia Anemia hgb 4.7 on presentation,s/p 3prbc transfusion, hgb appropriately improved to above 7  FOBT negative, he denies hematuria, no overt sign of bleeding, tbili 0.4 spep and light chain study , blood smear ordered Consider hematology consult if not improving  H/o nonischemic cardiomyopathy, possible due to combination of uncontrolled HTN and ETOH -lvef has normalized ( last echo in 2017), he report quit drinking, he reports bp normal runs around 150's at home, he run out two of the bp meds, he has been on hydralazine and coreg at home. He stop taking lasix for tow yrs after ef normalized. Hydralazine held due to concerns of drug induced SLE (ana and antihistone ab ordered)  unintentional weight loss by reports Body mass index is 18.79 kg/m.   Code Status: full  Family Communication: patient   Disposition Plan: not ready to discharge  Tele unremarkable, d/c tele  Consultants:  nephrology  Procedures:  prbc transfusion  Antibiotics:  none   Objective: BP 137/65 (BP Location: Right Arm)   Pulse (!) 52   Temp 97.6 F (36.4 C) (Oral)   Resp 18   Ht 6\' 1"  (1.854 m)   Wt 64.6 kg   SpO2 100%   BMI 18.79 kg/m    Intake/Output Summary (Last 24 hours) at 07/18/2018 1346 Last data filed at 07/18/2018 0900 Gross per 24 hour  Intake 1036.17 ml  Output 200 ml  Net 836.17 ml   Filed Weights   07/16/18 1810 07/16/18 2144 07/17/18 2026  Weight: 64 kg 61.9 kg 64.6 kg    Exam: Patient is examined daily including today on 07/18/2018, exams remain the same as of yesterday except that has changed    General:  NAD  Cardiovascular: RRR  Respiratory: CTABL  Abdomen: Soft/ND/NT, positive BS  Musculoskeletal: No Edema  Neuro: alert, oriented   Data Reviewed: Basic Metabolic Panel: Recent Labs  Lab 07/16/18 1836 07/17/18 0539 07/18/18 0453  NA 135 138 139  K 4.5 4.0 4.0  CL 106 109 110  CO2 15* 17* 17*  GLUCOSE 119* 93 101*  BUN 121* 118* 115*  CREATININE 6.23* 5.94* 6.30*  CALCIUM 8.9 8.5* 9.0  PHOS  --   --  6.3*   Liver Function Tests: Recent Labs  Lab 07/16/18 1836 07/18/18 0453  AST 16  --   ALT 19  --   ALKPHOS 44  --   BILITOT 0.4  --   PROT 7.2  --   ALBUMIN 3.3* 3.0*   No results for input(s): LIPASE, AMYLASE in the last 168 hours. No results for input(s): AMMONIA in the last 168 hours. CBC: Recent Labs  Lab 07/16/18 1836 07/17/18 0539 07/17/18 0801 07/17/18 1844 07/18/18 0848  WBC 2.8* 2.8* 2.9*  --  3.3*  NEUTROABS 2.3  --   --   --  2.6  HGB 4.7* 6.5* 6.4* 7.2* 7.4*  HCT 15.2* 19.3* 18.5* 21.8* 22.8*  MCV 92.1 87.7 87.3  --  88.4  PLT 124* 113* 91*  --  112*   Cardiac Enzymes:   No results for input(s): CKTOTAL, CKMB, CKMBINDEX, TROPONINI in the last 168 hours. BNP (last 3 results) No results for input(s): BNP in the last 8760 hours.  ProBNP (last 3 results) No results for input(s): PROBNP in the last 8760 hours.  CBG: No results for input(s): GLUCAP in the last 168 hours.  No results found for this or any previous visit (from the past 240 hour(s)).   Studies: No results found.  Scheduled Meds: . sodium chloride   Intravenous Once  . folic  acid  1 mg Oral Daily  . multivitamin with minerals  1 tablet Oral Daily  . sodium bicarbonate  1,300 mg Oral BID  . thiamine  100 mg Oral Daily   Or  . thiamine  100 mg Intravenous Daily    Continuous Infusions: . ferric gluconate (FERRLECIT/NULECIT) IV 125 mg (07/18/18 1229)     Time spent: 77mins I have personally reviewed and interpreted on  07/18/2018 daily labs, tele strips, imagings as discussed above under date review session and assessment and plans.  I reviewed all nursing notes, pharmacy notes, consultant notes,  vitals, pertinent old records  I have discussed plan of care as described above with RN , patient on 07/18/2018   Florencia Reasons MD, PhD  [facility] Pager [phone]. If 7PM-7AM, please contact night-coverage at [email or web address], password [facility] 07/18/2018, 1:46 PM  LOS: 2 days

## 2018-07-18 NOTE — Progress Notes (Signed)
[facility] NEPHROLOGY PROGRESS NOTE  Assessment/ Plan: Pt is a 58 y.o. yo male with hypertension, EtOH use, CHF, consulted for the evaluation of AKI.  Patient had abnormal labs at PCPs office and he was directed to ER.  #Severe AKI on CKD stage III, nonoliguric, no uremic symptoms: Exact etiology unknown however concern for underlying glomerulonephritis.  The creatinine level was 1.34 in 2017, now presented with creatinine of 6.23 and BUN 121. -Urinalysis with RBC more than 50, protein 30, no WBC.  US renal lites kidneys but mild increased cortical echogenicity. -Follow-up serology including ANA, complements, anti-GBM, ANCA, kappa lambda ratio, hepatitis and HIV panel.  Pending test results he may need kidney biopsy for further evaluation.  I have discussed this with the patient. -He has no uremic symptoms, electrolytes and volume status acceptable.  No need for dialysis at this time. -Check urine protein creatinine ratio, monitor urine output.  Avoid IV contrast or nephrotoxins.  #Anemia/pancytopenia: LDH and haptoglobin level normal.  Iron saturation only 10%.  I will order IV iron.  May need hematology consult.  #Metabolic acidosis: I will start sodium bicarbonate.  #Hypertension: Blood pressure acceptable.  He is currently not on any antihypertensive medication.  #History of CHF: May need echo.  Subjective: Seen and examined.  Patient reported feeling good and has no complaint.  Denied headache, dizziness, nausea, vomiting, chest pain, shortness of breath.  Has good oral intake. Objective Vital signs in last 24 hours: Vitals:   07/17/18 1143 07/17/18 1453 07/17/18 2026 07/18/18 1001  BP: 139/68 (!) 158/72 (!) 156/72 137/65  Pulse: (!) 59 61 60 (!) 52  Resp: 14 18 20 18   Temp: 98.4 F (36.9 C) 98.3 F (36.8 C) 98.1 F (36.7 C) 97.6 F (36.4 C)  TempSrc: Oral Oral Oral Oral  SpO2: 99% 99% 98% 100%  Weight:   64.6 kg   Height:       Weight change: 0.643  kg  Intake/Output Summary (Last 24 hours) at 07/18/2018 1044 Last data filed at 07/18/2018 0900 Gross per 24 hour  Intake 1036.17 ml  Output 200 ml  Net 836.17 ml       Labs: Basic Metabolic Panel: Recent Labs  Lab 07/16/18 1836 07/17/18 0539 07/18/18 0453  NA 135 138 139  K 4.5 4.0 4.0  CL 106 109 110  CO2 15* 17* 17*  GLUCOSE 119* 93 101*  BUN 121* 118* 115*  CREATININE 6.23* 5.94* 6.30*  CALCIUM 8.9 8.5* 9.0  PHOS  --   --  6.3*   Liver Function Tests: Recent Labs  Lab 07/16/18 1836 07/18/18 0453  AST 16  --   ALT 19  --   ALKPHOS 44  --   BILITOT 0.4  --   PROT 7.2  --   ALBUMIN 3.3* 3.0*   No results for input(s): LIPASE, AMYLASE in the last 168 hours. No results for input(s): AMMONIA in the last 168 hours. CBC: Recent Labs  Lab 07/16/18 1836 07/17/18 0539 07/17/18 0801 07/17/18 1844  WBC 2.8* 2.8* 2.9*  --   NEUTROABS 2.3  --   --   --   HGB 4.7* 6.5* 6.4* 7.2*  HCT 15.2* 19.3* 18.5* 21.8*  MCV 92.1 87.7 87.3  --   PLT 124* 113* 91*  --    Cardiac Enzymes: No results for input(s): CKTOTAL, CKMB, CKMBINDEX, TROPONINI in the last 168 hours. CBG: No results for input(s): GLUCAP in the last 168 hours.  Iron Studies:  Recent Labs  07/16/18 2021  IRON 29*  TIBC 298  FERRITIN 108   Studies/Results: US Renal  Result Date: 07/17/2018 CLINICAL DATA:  Acute renal failure EXAM: RENAL / URINARY TRACT ULTRASOUND COMPLETE COMPARISON:  None. FINDINGS: Right Kidney: Renal measurements: 12.5 x 5.2 x 5.6 cm = volume: 191.9 mL. Mild increased renal cortical echogenicity. No hydronephrosis. Left Kidney: Renal measurements: 13.2 x 6.2 x 6.6 cm = volume: 281.2 mL. Mild increased renal cortical echogenicity. No hydronephrosis. Bladder: Appears normal for degree of bladder distention. IMPRESSION: No hydronephrosis. Mild increased renal cortical echogenicity as can be seen with chronic medical renal disease. Electronically Signed   By: Lovey Newcomer M.D.   On:  07/17/2018 09:29   Dg Chest Port 1 View  Result Date: 07/16/2018 CLINICAL DATA:  Weakness EXAM: PORTABLE CHEST 1 VIEW COMPARISON:  06/22/2015 FINDINGS: Cardiomegaly. There may be patchy ground-glass opacity in the left mid lung and right infrahilar lung. No pneumothorax. IMPRESSION: 1. Suspected ground-glass infiltrates in the left mid lung and right infrahilar lung. 2. Mild cardiomegaly Electronically Signed   By: Donavan Foil M.D.   On: 07/16/2018 21:20    Medications: Infusions:   Scheduled Medications: . sodium chloride   Intravenous Once  . folic acid  1 mg Oral Daily  . multivitamin with minerals  1 tablet Oral Daily  . sodium bicarbonate  650 mg Oral BID  . thiamine  100 mg Oral Daily   Or  . thiamine  100 mg Intravenous Daily    have reviewed scheduled and prn medications.  Physical Exam: General:NAD, comfortable Heart:RRR, s1s2 nl, rubs Lungs:clear b/l, no crackle Abdomen:soft, Non-tender, non-distended Extremities:No edema Neurology: Alert, awake, following commands, no asterixis  Keyron Pokorski Prasad Aziah Kaiser 07/18/2018,10:44 AM  LOS: 2 days

## 2018-07-19 LAB — MPO/PR-3 (ANCA) ANTIBODIES
ANCA Proteinase 3: <3.5 U/mL (ref 0.0–3.5)
Myeloperoxidase Abs: >100 U/mL — ABNORMAL HIGH (ref 0.0–9.0)

## 2018-07-19 LAB — CBC WITH DIFFERENTIAL/PLATELET
Abs Immature Granulocytes: 0.01 10*3/uL (ref 0.00–0.07)
Basophils Absolute: 0 10*3/uL (ref 0.0–0.1)
Basophils Relative: 0 %
EOS ABS: 0.1 10*3/uL (ref 0.0–0.5)
Eosinophils Relative: 2 %
HCT: 24 % — ABNORMAL LOW (ref 39.0–52.0)
Hemoglobin: 7.8 g/dL — ABNORMAL LOW (ref 13.0–17.0)
Immature Granulocytes: 0 %
Lymphocytes Relative: 14 %
Lymphs Abs: 0.5 10*3/uL — ABNORMAL LOW (ref 0.7–4.0)
MCH: 28.8 pg (ref 26.0–34.0)
MCHC: 32.5 g/dL (ref 30.0–36.0)
MCV: 88.6 fL (ref 80.0–100.0)
Monocytes Absolute: 0.2 10*3/uL (ref 0.1–1.0)
Monocytes Relative: 7 %
Neutro Abs: 2.7 10*3/uL (ref 1.7–7.7)
Neutrophils Relative %: 77 %
PLATELETS: 126 10*3/uL — AB (ref 150–400)
RBC: 2.71 MIL/uL — ABNORMAL LOW (ref 4.22–5.81)
RDW: 13.5 % (ref 11.5–15.5)
WBC: 3.6 10*3/uL — AB (ref 4.0–10.5)
nRBC: 0 % (ref 0.0–0.2)

## 2018-07-19 LAB — BASIC METABOLIC PANEL
Anion gap: 9 (ref 5–15)
BUN: 114 mg/dL — ABNORMAL HIGH (ref 6–20)
CALCIUM: 8.7 mg/dL — AB (ref 8.9–10.3)
CO2: 19 mmol/L — ABNORMAL LOW (ref 22–32)
Chloride: 112 mmol/L — ABNORMAL HIGH (ref 98–111)
Creatinine, Ser: 6.04 mg/dL — ABNORMAL HIGH (ref 0.61–1.24)
GFR calc Af Amer: 11 mL/min — ABNORMAL LOW (ref >60)
GFR calc non Af Amer: 9 mL/min — ABNORMAL LOW (ref >60)
Glucose, Bld: 107 mg/dL — ABNORMAL HIGH (ref 70–99)
Potassium: 4.3 mmol/L (ref 3.5–5.1)
Sodium: 140 mmol/L (ref 135–145)

## 2018-07-19 LAB — C3 COMPLEMENT: C3 Complement: 62 mg/dL — ABNORMAL LOW (ref 82–167)

## 2018-07-19 LAB — HEPATITIS PANEL, ACUTE
HCV Ab: <0.1 s/co ratio (ref 0.0–0.9)
Hep A IgM: NEGATIVE
Hep B C IgM: NEGATIVE
Hepatitis B Surface Ag: NEGATIVE

## 2018-07-19 LAB — CEA: CEA: 0.9 ng/mL (ref 0.0–4.7)

## 2018-07-19 LAB — ANTISTREPTOLYSIN O TITER: ASO: 136 IU/mL (ref 0.0–200.0)

## 2018-07-19 LAB — PARATHYROID HORMONE, INTACT (NO CA): PTH: 33 pg/mL (ref 15–65)

## 2018-07-19 LAB — C4 COMPLEMENT: Complement C4, Body Fluid: 10 mg/dL — ABNORMAL LOW (ref 14–44)

## 2018-07-19 MED ORDER — SENNOSIDES-DOCUSATE SODIUM 8.6-50 MG PO TABS
1.0000 | ORAL_TABLET | Freq: Two times a day (BID) | ORAL | Status: DC
  Administered 2018-07-24 – 2018-07-30 (×6): 1 via ORAL
  Filled 2018-07-19 (×21): qty 1

## 2018-07-19 MED ORDER — DARBEPOETIN ALFA 60 MCG/0.3ML IJ SOSY
60.0000 ug | PREFILLED_SYRINGE | Freq: Once | INTRAMUSCULAR | Status: AC
  Administered 2018-07-19: 60 ug via SUBCUTANEOUS
  Filled 2018-07-19: qty 0.3

## 2018-07-19 MED ORDER — DARBEPOETIN ALFA 60 MCG/0.3ML IJ SOSY
60.0000 ug | PREFILLED_SYRINGE | Freq: Once | INTRAMUSCULAR | Status: DC
  Filled 2018-07-19: qty 0.3

## 2018-07-19 NOTE — Progress Notes (Signed)
[facility] NEPHR[facility] PROGRESS NOTE  Assessment/ Plan: Pt is a 58 y.o. yo male with hypertension, EtOH use, CHF, consulted for the evaluation of AKI.  Patient had abnormal labs at PCPs office and he was directed to ER.  #Severe AKI on CKD stage III, nonoliguric, no uremic symptoms: Exact etiology unknown however concern for underlying glomerulonephritis.  The creatinine level was 1.34 in 2017, now presented with creatinine of 6.23 and BUN 121. -Urinalysis with RBC more than 50, protein 30, no WBC.  US renal increased renal cortical echogenicity kidneys.  24-hour urine protein 1917. -ASO, dsDNA antibody, hepatitis B, C, HIV negative.  Complements are low.  Follow-up ANA, anti-GBM, ANCA, kappa lambda ratio.  Pending test results he will need kidney biopsy for further evaluation.  I have discussed this with the patient.  Likely biopsy on Monday. -He has no uremic symptoms, electrolytes and volume status acceptable.  No need for dialysis at this time. - Avoid IV contrast or nephrotoxins.  #Anemia/pancytopenia: LDH and haptoglobin level normal.  Iron saturation only 10%.  Continue IV iron, order a dose of Aranesp.  May need hematology consult.  #Metabolic acidosis: Continue sodium bicarbonate.  #Hypertension: Blood pressure acceptable.  He is currently not on any antihypertensive medication.  #History of CHF: May need echo.  Subjective: Seen and examined.  No new event.  Denied nausea, vomiting, chest pain, shortness of breath. Objective Vital signs in last 24 hours: Vitals:   07/18/18 1816 07/18/18 2125 07/19/18 0526 07/19/18 0913  BP: (!) 143/72 (!) 155/77 (!) 151/71 (!) 143/77  Pulse: (!) 58 (!) 57 (!) 57 (!) 54  Resp: 18 17 18 18   Temp: 98.1 F (36.7 C) 98.3 F (36.8 C) 98.1 F (36.7 C) 98.7 F (37.1 C)  TempSrc: Oral Oral Oral Oral  SpO2: 99% 99% 98% 100%  Weight:  64.5 kg    Height:       Weight change: -0.1 kg  Intake/Output Summary (Last 24 hours) at  07/19/2018 1050 Last data filed at 07/19/2018 1040 Gross per 24 hour  Intake 948.04 ml  Output 2000 ml  Net -1051.96 ml       Labs: Basic Metabolic Panel: Recent Labs  Lab 07/17/18 0539 07/18/18 0453 07/19/18 0455  NA 138 139 140  K 4.0 4.0 4.3  CL 109 110 112*  CO2 17* 17* 19*  GLUCOSE 93 101* 107*  BUN 118* 115* 114*  CREATININE 5.94* 6.30* 6.04*  CALCIUM 8.5* 9.0 8.7*  PHOS  --  6.3*  --    Liver Function Tests: Recent Labs  Lab 07/16/18 1836 07/18/18 0453  AST 16  --   ALT 19  --   ALKPHOS 44  --   BILITOT 0.4  --   PROT 7.2  --   ALBUMIN 3.3* 3.0*   No results for input(s): LIPASE, AMYLASE in the last 168 hours. No results for input(s): AMMONIA in the last 168 hours. CBC: Recent Labs  Lab 07/16/18 1836 07/17/18 0539 07/17/18 0801 07/17/18 1844 07/18/18 0848 07/19/18 0455  WBC 2.8* 2.8* 2.9*  --  3.3* 3.6*  NEUTROABS 2.3  --   --   --  2.6 2.7  HGB 4.7* 6.5* 6.4* 7.2* 7.4* 7.8*  HCT 15.2* 19.3* 18.5* 21.8* 22.8* 24.0*  MCV 92.1 87.7 87.3  --  88.4 88.6  PLT 124* 113* 91*  --  112* 126*   Cardiac Enzymes: No results for input(s): CKTOTAL, CKMB, CKMBINDEX, TROPONINI in the last 168 hours. CBG: No  results for input(s): GLUCAP in the last 168 hours.  Iron Studies:  Recent Labs    07/16/18 2021  IRON 29*  TIBC 298  FERRITIN 108   Studies/Results: No results found.  Medications: Infusions: . ferric gluconate (FERRLECIT/NULECIT) IV 125 mg (07/19/18 1040)    Scheduled Medications: . sodium chloride   Intravenous Once  . folic acid  1 mg Oral Daily  . multivitamin with minerals  1 tablet Oral Daily  . sodium bicarbonate  1,300 mg Oral BID  . thiamine  100 mg Oral Daily   Or  . thiamine  100 mg Intravenous Daily    have reviewed scheduled and prn medications.  Physical Exam: General: In distress, comfortable Heart:RRR, s1s2 nl, rubs Lungs: Clear bilateral, no wheezing or crackles Abdomen:soft, Non-tender, non-distended Extremities:  No edema Neurology: Alert, awake, following commands, no asterixis   Prasad  07/19/2018,10:50 AM  LOS: 3 days

## 2018-07-19 NOTE — Progress Notes (Signed)
PROGRESS NOTE  Jeremy ZACHARIA KNL:[id number] DOB: 03/23/1961 DOA: 07/16/2018 PCP: Hayden Rasmussen, MD  HPI/Recap of past 24 hours:   He denies pain, no sob, no edema No overt bleeding  Assessment/Plan: Principal Problem:   Acute kidney failure ([place]) Active Problems:   Malignant hypertension   NICM (nonischemic cardiomyopathy) ([place])   History of ETOH abuse   Chronic combined systolic and diastolic CHF (congestive heart failure) ([facility])   Normocytic normochromic anemia   Pancytopenia ([facility])  AKI/metabolic acidosis -unclear etiology , euvolemic with good urine output, no obstruction by renal US, ua + rbc, no gross hematuria -broad differential , plan for renal biopsy early next week, nephrology consulted, input appreciated  Pancytopenia Anemia/thrombocytopenia/neutropenia, improveing hgb 4.7 on presentation,s/p 3prbc transfusion, hgb appropriately improved to above 7 , s/p iv iron as well FOBT negative, he denies hematuria, no overt sign of bleeding, tbili 0.4 spep and light chain study , blood smear ordered Hold off hematology consult now that counts is improving  H/o nonischemic cardiomyopathy, possible due to combination of uncontrolled HTN and ETOH -lvef has normalized ( last echo in 2017), he report quit drinking, he reports bp normal runs around 150's at home, he run out two of the bp meds, he has been on hydralazine and coreg at home. He stop taking lasix for tow yrs after ef normalized. Hydralazine held due to concerns of drug induced SLE (ana and antihistone ab ordered)  unintentional weight loss by reports Body mass index is 18.76 kg/m.   Code Status: full  Family Communication: patient   Disposition Plan: not ready to discharge, plan for renal biopsy early next week    Consultants:  nephrology  Procedures:  prbc transfusion  Iv iron  Renal biopsy planned   Antibiotics:  none   Objective: BP (!) 143/77 (BP Location: Left Arm)   Pulse (!) 54    Temp 98.7 F (37.1 C) (Oral)   Resp 18   Ht 6\' 1"  (1.854 m)   Wt 64.5 kg   SpO2 100%   BMI 18.76 kg/m   Intake/Output Summary (Last 24 hours) at 07/19/2018 1116 Last data filed at 07/19/2018 1040 Gross per 24 hour  Intake 948.04 ml  Output 2000 ml  Net -1051.96 ml   Filed Weights   07/16/18 2144 07/17/18 2026 07/18/18 2125  Weight: 61.9 kg 64.6 kg 64.5 kg    Exam: Patient is examined daily including today on 07/19/2018, exams remain the same as of yesterday except that has changed    General:  NAD  Cardiovascular: RRR  Respiratory: CTABL  Abdomen: Soft/ND/NT, positive BS  Musculoskeletal: No Edema  Neuro: alert, oriented   Data Reviewed: Basic Metabolic Panel: Recent Labs  Lab 07/16/18 1836 07/17/18 0539 07/18/18 0453 07/19/18 0455  NA 135 138 139 140  K 4.5 4.0 4.0 4.3  CL 106 109 110 112*  CO2 15* 17* 17* 19*  GLUCOSE 119* 93 101* 107*  BUN 121* 118* 115* 114*  CREATININE 6.23* 5.94* 6.30* 6.04*  CALCIUM 8.9 8.5* 9.0 8.7*  PHOS  --   --  6.3*  --    Liver Function Tests: Recent Labs  Lab 07/16/18 1836 07/18/18 0453  AST 16  --   ALT 19  --   ALKPHOS 44  --   BILITOT 0.4  --   PROT 7.2  --   ALBUMIN 3.3* 3.0*   No results for input(s): LIPASE, AMYLASE in the last 168 hours. No results for input(s): AMMONIA in  the last 168 hours. CBC: Recent Labs  Lab 07/16/18 1836 07/17/18 0539 07/17/18 0801 07/17/18 1844 07/18/18 0848 07/19/18 0455  WBC 2.8* 2.8* 2.9*  --  3.3* 3.6*  NEUTROABS 2.3  --   --   --  2.6 2.7  HGB 4.7* 6.5* 6.4* 7.2* 7.4* 7.8*  HCT 15.2* 19.3* 18.5* 21.8* 22.8* 24.0*  MCV 92.1 87.7 87.3  --  88.4 88.6  PLT 124* 113* 91*  --  112* 126*   Cardiac Enzymes:   No results for input(s): CKTOTAL, CKMB, CKMBINDEX, TROPONINI in the last 168 hours. BNP (last 3 results) No results for input(s): BNP in the last 8760 hours.  ProBNP (last 3 results) No results for input(s): PROBNP in the last 8760 hours.  CBG: No results for  input(s): GLUCAP in the last 168 hours.  No results found for this or any previous visit (from the past 240 hour(s)).   Studies: No results found.  Scheduled Meds: . sodium chloride   Intravenous Once  . darbepoetin (ARANESP) injection - NON-DIALYSIS  60 mcg Subcutaneous Once  . folic acid  1 mg Oral Daily  . multivitamin with minerals  1 tablet Oral Daily  . sodium bicarbonate  1,300 mg Oral BID  . thiamine  100 mg Oral Daily   Or  . thiamine  100 mg Intravenous Daily    Continuous Infusions: . ferric gluconate (FERRLECIT/NULECIT) IV 125 mg (07/19/18 1040)     Time spent: 55mins I have personally reviewed and interpreted on  07/19/2018 daily labs, tele strips, imagings as discussed above under date review session and assessment and plans.  I reviewed all nursing notes, pharmacy notes, consultant notes,  vitals, pertinent old records  I have discussed plan of care as described above with RN , patient on 07/19/2018   Florencia Reasons MD, PhD  [facility] Pager [phone]. If 7PM-7AM, please contact night-coverage at [email or web address], password [facility] 07/19/2018, 11:16 AM  LOS: 3 days

## 2018-07-20 LAB — BASIC METABOLIC PANEL
Anion gap: 10 (ref 5–15)
BUN: 114 mg/dL — ABNORMAL HIGH (ref 6–20)
CO2: 19 mmol/L — ABNORMAL LOW (ref 22–32)
CREATININE: 5.73 mg/dL — AB (ref 0.61–1.24)
Calcium: 8.9 mg/dL (ref 8.9–10.3)
Chloride: 110 mmol/L (ref 98–111)
GFR calc Af Amer: 12 mL/min — ABNORMAL LOW (ref >60)
GFR calc non Af Amer: 10 mL/min — ABNORMAL LOW (ref >60)
Glucose, Bld: 99 mg/dL (ref 70–99)
Potassium: 4.3 mmol/L (ref 3.5–5.1)
Sodium: 139 mmol/L (ref 135–145)

## 2018-07-20 MED ORDER — PANTOPRAZOLE SODIUM 40 MG PO TBEC
40.0000 mg | DELAYED_RELEASE_TABLET | Freq: Every day | ORAL | Status: AC
  Administered 2018-07-20 – 2018-07-22 (×3): 40 mg via ORAL
  Filled 2018-07-20 (×3): qty 1

## 2018-07-20 MED ORDER — SODIUM CHLORIDE 0.9 % IV SOLN
1000.0000 mg | Freq: Every day | INTRAVENOUS | Status: AC
  Administered 2018-07-20 – 2018-07-22 (×3): 1000 mg via INTRAVENOUS
  Filled 2018-07-20 (×3): qty 8

## 2018-07-20 NOTE — Progress Notes (Addendum)
[facility] NEPH[facility] PROGRESS NOTE  Assessment/ Plan: Pt is a 58 y.o. yo male with hypertension, EtOH use, CHF, consulted for the evaluation of AKI.  Patient had abnormal labs at PCPs office and he was directed to ER.  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  The creatinine level was 1.34 in 2017, now presented with creatinine of 6.23 and BUN 121. -Urinalysis with RBC more than 50, protein 30, no WBC.  US renal increased renal cortical echogenicity kidneys.  24-hour urine protein 1917. -ASO, dsDNA antibody, hepatitis B, C, HIV negative.  Complements are low and MPO Ab elevated  >100, anti histone Ab +. Pending anti-GBM,  kappa lambda ratio. -His creatinine level is better today.  He has no systemic symptoms or pulmonary involvement.  I consulted IR for kidney biopsy, I will keep n.p.o. post midnight.  Pending biopsy result I started IV Solu-Medrol 1 g for 3 days.  He should be off of hydralazine, also discussed with primary team.  Further treatment plan depending on biopsy result.  I have discussed the benefit and risk of biopsy including bleeding, infection and loss of kidney function  with the patient and he agreed with the treatment.  He is not on anticoagulation.  I have discussed with IR as well. -He has no uremic symptoms, electrolytes and volume status acceptable.  No need for dialysis at this time. - Avoid IV contrast or nephrotoxins.  #Anemia/pancytopenia: LDH and haptoglobin level normal.  Iron saturation only 10%.  Continue IV iron, order a dose of Aranesp.  Monitor CBC.  #Metabolic acidosis: Continue sodium bicarbonate.  #Hypertension: Blood pressure acceptable.  He is currently not on any antihypertensive medication.  Given hydralazine until this admission.  #History of CHF: May need echo.  Subjective: Seen and examined.  No new event.  Denied any, dizziness, nausea,  vomiting, chest pain, shortness of breath.    Objective Vital signs in last 24 hours: Vitals:   07/19/18 1606 07/19/18 2116 07/20/18 0537 07/20/18 0947  BP: (!) 142/68 (!) 156/77 (!) 152/73 (!) 158/70  Pulse: (!) 54 (!) 56 (!) 50 65  Resp: 20 18 18 18   Temp: 98.2 F (36.8 C) 98.2 F (36.8 C) 97.9 F (36.6 C) 98.1 F (36.7 C)  TempSrc: Oral Oral Oral Oral  SpO2: 100% 100% 100% 100%  Weight:  64.4 kg    Height:       Weight change: -0.1 kg  Intake/Output Summary (Last 24 hours) at 07/20/2018 1150 Last data filed at 07/20/2018 0948 Gross per 24 hour  Intake 750 ml  Output 2475 ml  Net -1725 ml       Labs: Basic Metabolic Panel: Recent Labs  Lab 07/18/18 0453 07/19/18 0455 07/20/18 0533  NA 139 140 139  K 4.0 4.3 4.3  CL 110 112* 110  CO2 17* 19* 19*  GLUCOSE 101* 107* 99  BUN 115* 114* 114*  CREATININE 6.30* 6.04* 5.73*  CALCIUM 9.0 8.7* 8.9  PHOS 6.3*  --   --    Liver Function Tests: Recent Labs  Lab 07/16/18 1836 07/18/18 0453  AST 16  --   ALT 19  --   ALKPHOS 44  --   BILITOT 0.4  --   PROT 7.2  --   ALBUMIN 3.3* 3.0*   No results for input(s): LIPASE, AMYLASE in the last 168 hours. No results for input(s): AMMONIA in the last 168 hours.  CBC: Recent Labs  Lab 07/16/18 1836 07/17/18 0539 07/17/18 0801 07/17/18 1844 07/18/18 0848 07/19/18 0455  WBC 2.8* 2.8* 2.9*  --  3.3* 3.6*  NEUTROABS 2.3  --   --   --  2.6 2.7  HGB 4.7* 6.5* 6.4* 7.2* 7.4* 7.8*  HCT 15.2* 19.3* 18.5* 21.8* 22.8* 24.0*  MCV 92.1 87.7 87.3  --  88.4 88.6  PLT 124* 113* 91*  --  112* 126*   Cardiac Enzymes: No results for input(s): CKTOTAL, CKMB, CKMBINDEX, TROPONINI in the last 168 hours. CBG: No results for input(s): GLUCAP in the last 168 hours.  Iron Studies:  No results for input(s): IRON, TIBC, TRANSFERRIN, FERRITIN in the last 72 hours. Studies/Results: No results found.  Medications: Infusions: . ferric gluconate (FERRLECIT/NULECIT) IV 125 mg (07/20/18  1029)  . methylPREDNISolone (SOLU-MEDROL) injection      Scheduled Medications: . sodium chloride   Intravenous Once  . folic acid  1 mg Oral Daily  . multivitamin with minerals  1 tablet Oral Daily  . pantoprazole  40 mg Oral Daily  . senna-docusate  1 tablet Oral BID  . sodium bicarbonate  1,300 mg Oral BID  . thiamine  100 mg Oral Daily   Or  . thiamine  100 mg Intravenous Daily    have reviewed scheduled and prn medications.  Physical Exam: General: Not in distress, comfortable Heart:RRR, s1s2 nl, no rubs Lungs: Clear bilateral, no wheezing or crackle Abdomen:soft, Non-tender, non-distended Extremities: No lower extremity edema Neurology: Alert, awake, following commands, no asterixis   Prasad  07/20/2018,11:50 AM  LOS: 4 days

## 2018-07-20 NOTE — Progress Notes (Signed)
PROGRESS NOTE  Jeremy Grant YQI:[id number] DOB: Sep 28, 1960 DOA: 07/16/2018 PCP: Hayden Rasmussen, MD  HPI/Recap of past 24 hours:   He denies pain, no sob, no edema No overt bleeding  Assessment/Plan: Principal Problem:   Acute kidney failure ([place]) Active Problems:   Malignant hypertension   NICM (nonischemic cardiomyopathy) ([facility])   History of ETOH abuse   Chronic combined systolic and diastolic CHF (congestive heart failure) ([facility])   Normocytic normochromic anemia   Pancytopenia ([facility])  AKI/metabolic acidosis -unclear etiology , euvolemic with good urine output, no obstruction by renal US, ua + rbc, no gross hematuria -+ antihistone antibody, low complement, 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 Solumedrol per nephrology - renal biopsy planned on Monday - nephrology  consulted, Dr Carolin Sicks input greatly appreciated  Pancytopenia Anemia/thrombocytopenia/neutropenia, improveing hgb 4.7 on presentation,s/p 3prbc transfusion, hgb appropriately improved to above 7 , s/p iv iron as well FOBT negative, he denies hematuria, no overt sign of bleeding, tbili 0.4 spep and light chain study , blood smear ordered Hold off hematology consult now that counts is improving  H/o nonischemic cardiomyopathy, possible due to combination of uncontrolled HTN and ETOH -lvef has normalized ( last echo in 2017), he report quit drinking, he reports bp normal runs around 150's at home, he run out two of the bp meds, he has been on hydralazine and coreg at home. He stop taking lasix for tow yrs after ef normalized. Hydralazine held due to concerns of drug induced SLE (antihisone antibody +), patient advised to avoid hydralazine in the futher  unintentional weight loss by reports Body mass index is 18.73 kg/m.   Code Status: full  Family Communication: patient   Disposition Plan: possible Tuesday, after renal biopsy and finish  three days of iv steroids Need nephrology clearance    Consultants:  nephrology  Procedures:  prbc transfusion  Iv iron  Renal biopsy planned on 3/9  Antibiotics:  none   Objective: BP (!) 158/70 (BP Location: Left Arm)   Pulse 65   Temp 98.1 F (36.7 C) (Oral)   Resp 18   Ht 6\' 1"  (1.854 m)   Wt 64.4 kg   SpO2 100%   BMI 18.73 kg/m   Intake/Output Summary (Last 24 hours) at 07/20/2018 1155 Last data filed at 07/20/2018 0948 Gross per 24 hour  Intake 750 ml  Output 2475 ml  Net -1725 ml   Filed Weights   07/17/18 2026 07/18/18 2125 07/19/18 2116  Weight: 64.6 kg 64.5 kg 64.4 kg    Exam: Patient is examined daily including today on 07/20/2018, exams remain the same as of yesterday except that has changed    General:  NAD  Cardiovascular: RRR  Respiratory: CTABL  Abdomen: Soft/ND/NT, positive BS  Musculoskeletal: No Edema  Neuro: alert, oriented   Data Reviewed: Basic Metabolic Panel: Recent Labs  Lab 07/16/18 1836 07/17/18 0539 07/18/18 0453 07/19/18 0455 07/20/18 0533  NA 135 138 139 140 139  K 4.5 4.0 4.0 4.3 4.3  CL 106 109 110 112* 110  CO2 15* 17* 17* 19* 19*  GLUCOSE 119* 93 101* 107* 99  BUN 121* 118* 115* 114* 114*  CREATININE 6.23* 5.94* 6.30* 6.04* 5.73*  CALCIUM 8.9 8.5* 9.0 8.7* 8.9  PHOS  --   --  6.3*  --   --    Liver Function Tests: Recent Labs  Lab 07/16/18 1836 07/18/18 0453  AST 16  --   ALT 19  --  ALKPHOS 44  --   BILITOT 0.4  --   PROT 7.2  --   ALBUMIN 3.3* 3.0*   No results for input(s): LIPASE, AMYLASE in the last 168 hours. No results for input(s): AMMONIA in the last 168 hours. CBC: Recent Labs  Lab 07/16/18 1836 07/17/18 0539 07/17/18 0801 07/17/18 1844 07/18/18 0848 07/19/18 0455  WBC 2.8* 2.8* 2.9*  --  3.3* 3.6*  NEUTROABS 2.3  --   --   --  2.6 2.7  HGB 4.7* 6.5* 6.4* 7.2* 7.4* 7.8*  HCT 15.2* 19.3* 18.5* 21.8* 22.8* 24.0*  MCV 92.1 87.7 87.3  --  88.4 88.6  PLT 124* 113* 91*  --   112* 126*   Cardiac Enzymes:   No results for input(s): CKTOTAL, CKMB, CKMBINDEX, TROPONINI in the last 168 hours. BNP (last 3 results) No results for input(s): BNP in the last 8760 hours.  ProBNP (last 3 results) No results for input(s): PROBNP in the last 8760 hours.  CBG: No results for input(s): GLUCAP in the last 168 hours.  No results found for this or any previous visit (from the past 240 hour(s)).   Studies: No results found.  Scheduled Meds: . sodium chloride   Intravenous Once  . folic acid  1 mg Oral Daily  . multivitamin with minerals  1 tablet Oral Daily  . pantoprazole  40 mg Oral Daily  . senna-docusate  1 tablet Oral BID  . sodium bicarbonate  1,300 mg Oral BID  . thiamine  100 mg Oral Daily   Or  . thiamine  100 mg Intravenous Daily    Continuous Infusions: . ferric gluconate (FERRLECIT/NULECIT) IV 125 mg (07/20/18 1029)  . methylPREDNISolone (SOLU-MEDROL) injection       Time spent: 79mins I have personally reviewed and interpreted on  07/20/2018 daily labs,  imagings as discussed above under date review session and assessment and plans.  I reviewed all nursing notes, pharmacy notes, consultant notes,  vitals, pertinent old records  I have discussed plan of care as described above with RN , patient on 07/20/2018   Florencia Reasons MD, PhD  [facility] Pager [phone]. If 7PM-7AM, please contact night-coverage at [email or web address], password [email or web address] 07/20/2018, 11:55 AM  LOS: 4 days

## 2018-07-21 LAB — BASIC METABOLIC PANEL
Anion gap: 14 (ref 5–15)
BUN: 123 mg/dL — ABNORMAL HIGH (ref 6–20)
CO2: 17 mmol/L — ABNORMAL LOW (ref 22–32)
Calcium: 9.2 mg/dL (ref 8.9–10.3)
Chloride: 106 mmol/L (ref 98–111)
Creatinine, Ser: 6.32 mg/dL — ABNORMAL HIGH (ref 0.61–1.24)
GFR calc Af Amer: 10 mL/min — ABNORMAL LOW (ref >60)
GFR calc non Af Amer: 9 mL/min — ABNORMAL LOW (ref >60)
GLUCOSE: 205 mg/dL — AB (ref 70–99)
Potassium: 4.4 mmol/L (ref 3.5–5.1)
Sodium: 137 mmol/L (ref 135–145)

## 2018-07-21 LAB — UPEP/UIFE/LIGHT CHAINS/TP, 24-HR UR
% BETA, Urine: 21 %
ALPHA 1 URINE: 2.9 %
Albumin, U: 40.4 %
Alpha 2, Urine: 9.6 %
Free Kappa Lt Chains,Ur: 286.31 mg/L — ABNORMAL HIGH (ref 0.63–113.79)
Free Kappa/Lambda Ratio: 5.52 (ref 1.03–31.76)
Free Lambda Lt Chains,Ur: 51.86 mg/L — ABNORMAL HIGH (ref 0.47–11.77)
GAMMA GLOBULIN URINE: 26.2 %
M-SPIKE %, Urine: 6.4 % — ABNORMAL HIGH
M-Spike, Mg/24 Hr: 90 mg/24 hr — ABNORMAL HIGH
Total Protein, Urine-Ur/day: 1409 mg/24 hr — ABNORMAL HIGH (ref 30–150)
Total Protein, Urine: 52.2 mg/dL
Total Volume: 2700

## 2018-07-21 LAB — PROTEIN ELECTROPHORESIS, SERUM
A/G Ratio: 1 (ref 0.7–1.7)
Albumin ELP: 3.2 g/dL (ref 2.9–4.4)
Alpha-1-Globulin: 0.3 g/dL (ref 0.0–0.4)
Alpha-2-Globulin: 0.6 g/dL (ref 0.4–1.0)
Beta Globulin: 0.7 g/dL (ref 0.7–1.3)
Gamma Globulin: 1.6 g/dL (ref 0.4–1.8)
Globulin, Total: 3.2 g/dL (ref 2.2–3.9)
M-Spike, %: 0.4 g/dL — ABNORMAL HIGH
Total Protein ELP: 6.4 g/dL (ref 6.0–8.5)

## 2018-07-21 LAB — CBC
HEMATOCRIT: 24.6 % — AB (ref 39.0–52.0)
Hemoglobin: 8.2 g/dL — ABNORMAL LOW (ref 13.0–17.0)
MCH: 29.4 pg (ref 26.0–34.0)
MCHC: 33.3 g/dL (ref 30.0–36.0)
MCV: 88.2 fL (ref 80.0–100.0)
Platelets: 128 10*3/uL — ABNORMAL LOW (ref 150–400)
RBC: 2.79 MIL/uL — ABNORMAL LOW (ref 4.22–5.81)
RDW: 12.8 % (ref 11.5–15.5)
WBC: 4.7 10*3/uL (ref 4.0–10.5)
nRBC: 0 % (ref 0.0–0.2)

## 2018-07-21 LAB — KAPPA/LAMBDA LIGHT CHAINS
Kappa free light chain: 202.1 mg/L — ABNORMAL HIGH (ref 3.3–19.4)
Kappa, lambda light chain ratio: 1.79 — ABNORMAL HIGH (ref 0.26–1.65)
Lambda free light chains: 113 mg/L — ABNORMAL HIGH (ref 5.7–26.3)

## 2018-07-21 LAB — GLOMERULAR BASEMENT MEMBRANE ANTIBODIES: GBM Ab: 3 units (ref 0–20)

## 2018-07-21 NOTE — Progress Notes (Signed)
PROGRESS NOTE  Jeremy Grant:[id number] DOB: 15-Sep-1960 DOA: 07/16/2018 PCP: Hayden Rasmussen, MD  HPI/Recap of past 24 hours:   He is started on high dose steroids  He denies pain, no sob, no edema No overt bleeding  Renal biopsy rescheduled for tomorrow  Wife at bedside  Assessment/Plan: Principal Problem:   Acute kidney failure ([place]) Active Problems:   Malignant hypertension   NICM (nonischemic cardiomyopathy) ([place])   History of ETOH abuse   Chronic combined systolic and diastolic CHF (congestive heart failure) (HCC)   Normocytic normochromic anemia   Pancytopenia (H[facility])  AKI/metabolic acidosis -unclear etiology , euvolemic with good urine output, no obstruction by renal US, ua + rbc, no gross hematuria -+ antihistone antibody, low complement, 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 Solumedrol per nephrology -also has small m spike on spep/upep - renal biopsy planned tomorrow on 3/10 - nephrology  consulted, Dr Carolin Sicks input greatly appreciated  Pancytopenia Anemia/thrombocytopenia/neutropenia, improving hgb 4.7 on presentation,s/p 3prbc transfusion, hgb appropriately improved to above 7 , s/p iv iron as well FOBT negative, he denies hematuria, no overt sign of bleeding, tbili 0.4 Small m spike on spep/upep, slightly elevated kappa/lamda ratio  blood smear ordered Will discuss with hematology   H/o nonischemic cardiomyopathy, possible due to combination of uncontrolled HTN and ETOH -lvef has normalized ( last echo in 2017), he report quit drinking, he reports bp normal runs around 150's at home, he run out two of the bp meds, he has been on hydralazine and coreg at home. He stop taking lasix for tow yrs after ef normalized. Hydralazine held due to concerns of drug induced SLE (antihisone antibody +), patient advised to avoid hydralazine in the futher  unintentional weight loss by reports Body mass  index is 18.73 kg/m.   Code Status: full  Family Communication: patient and wife at bedside  Disposition Plan: possible Tuesday, after renal biopsy and finish three days of iv steroids Need nephrology clearance, need to talk to hematology    Consultants:  Nephrology  IR  Procedures:  prbc transfusion  Iv iron  Renal biopsy planned on 3/9  Antibiotics:  none   Objective: BP (!) 160/76 (BP Location: Left Arm)   Pulse (!) 54   Temp 97.8 F (36.6 C) (Oral)   Resp 18   Ht 6\' 1"  (1.854 m)   Wt 64.4 kg   SpO2 100%   BMI 18.73 kg/m   Intake/Output Summary (Last 24 hours) at 07/21/2018 1414 Last data filed at 07/21/2018 0948 Gross per 24 hour  Intake 655.1 ml  Output 2150 ml  Net -1494.9 ml   Filed Weights   07/17/18 2026 07/18/18 2125 07/19/18 2116  Weight: 64.6 kg 64.5 kg 64.4 kg    Exam: Patient is examined daily including today on 07/21/2018, exams remain the same as of yesterday except that has changed    General:  NAD  Cardiovascular: RRR  Respiratory: CTABL  Abdomen: Soft/ND/NT, positive BS  Musculoskeletal: No Edema  Neuro: alert, oriented   Data Reviewed: Basic Metabolic Panel: Recent Labs  Lab 07/17/18 0539 07/18/18 0453 07/19/18 0455 07/20/18 0533 07/21/18 0540  NA 138 139 140 139 137  K 4.0 4.0 4.3 4.3 4.4  CL 109 110 112* 110 106  CO2 17* 17* 19* 19* 17*  GLUCOSE 93 101* 107* 99 205*  BUN 118* 115* 114* 114* 123*  CREATININE 5.94* 6.30* 6.04* 5.73* 6.32*  CALCIUM 8.5* 9.0 8.7* 8.9 9.2  PHOS  --  6.3*  --   --   --    Liver Function Tests: Recent Labs  Lab 07/16/18 1836 07/18/18 0453  AST 16  --   ALT 19  --   ALKPHOS 44  --   BILITOT 0.4  --   PROT 7.2  --   ALBUMIN 3.3* 3.0*   No results for input(s): LIPASE, AMYLASE in the last 168 hours. No results for input(s): AMMONIA in the last 168 hours. CBC: Recent Labs  Lab 07/16/18 1836 07/17/18 0539 07/17/18 0801 07/17/18 1844 07/18/18 0848 07/19/18 0455  07/21/18 0540  WBC 2.8* 2.8* 2.9*  --  3.3* 3.6* 4.7  NEUTROABS 2.3  --   --   --  2.6 2.7  --   HGB 4.7* 6.5* 6.4* 7.2* 7.4* 7.8* 8.2*  HCT 15.2* 19.3* 18.5* 21.8* 22.8* 24.0* 24.6*  MCV 92.1 87.7 87.3  --  88.4 88.6 88.2  PLT 124* 113* 91*  --  112* 126* 128*   Cardiac Enzymes:   No results for input(s): CKTOTAL, CKMB, CKMBINDEX, TROPONINI in the last 168 hours. BNP (last 3 results) No results for input(s): BNP in the last 8760 hours.  ProBNP (last 3 results) No results for input(s): PROBNP in the last 8760 hours.  CBG: No results for input(s): GLUCAP in the last 168 hours.  No results found for this or any previous visit (from the past 240 hour(s)).   Studies: No results found.  Scheduled Meds: . sodium chloride   Intravenous Once  . folic acid  1 mg Oral Daily  . multivitamin with minerals  1 tablet Oral Daily  . pantoprazole  40 mg Oral Daily  . senna-docusate  1 tablet Oral BID  . sodium bicarbonate  1,300 mg Oral BID  . thiamine  100 mg Oral Daily   Or  . thiamine  100 mg Intravenous Daily    Continuous Infusions: . ferric gluconate (FERRLECIT/NULECIT) IV 125 mg (07/21/18 1120)  . methylPREDNISolone (SOLU-MEDROL) injection 1,000 mg (07/21/18 0947)     Time spent: 82mins I have personally reviewed and interpreted on  07/21/2018 daily labs,  imagings as discussed above under date review session and assessment and plans.  I reviewed all nursing notes, pharmacy notes, consultant notes,  vitals, pertinent old records  I have discussed plan of care as described above with RN , patient on 07/21/2018   Florencia Reasons MD, PhD  [facility] Pager [phone]. If 7PM-7AM, please contact night-coverage at [email or web address], password [facility] 07/21/2018, 2:14 PM  LOS: 5 days

## 2018-07-21 NOTE — Progress Notes (Signed)
[facility] ROUNDING NOTE   Subjective:   History this is a very pleasant gentleman with stage III chronic kidney disease 58 years of age history of alcohol use and congestive heart failure.  He was admitted with acute kidney injury and a nephritic appearing urine sediment.  His serology revealed an elevated MPO antibody of greater than 100 anti-histone antibody positive and a pending anti-GBM.  His kappa lambda ratio was also elevated.  Some of his other serologies were negative including an ANA hepatitis B C HIV and ASO.  He is scheduled for renal biopsy  07/21/2018.  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  His hydralazine has been discontinued.  Baseline serum creatinine appears to be about 1.3.  Creatinine on admission was 6.23.  Blood pressure 160/76 pulse 54 temperature 97.8 O2 sats 1% room air  Sodium 137 potassium 4.4 chloride 106 CO2 17 glucose 205 BUN 123 creatinine 6.32 calcium 9.2.  WBC 4.7 hemoglobin 8.2 platelets 128.  Urine output 3.125 L 07/20/2018 no recent weight  Solu-Medrol 1 g daily first dose 07/20/2018 for 3 doses.  Will need to transition to oral prednisone 07/23/2018, sodium bicarbonate 1.3 g twice daily, Protonix 40 mg daily, folic acid 1 mg daily iron 125 mg x 5 doses, multivitamins 1 daily  Chest x-ray showed suspected groundglass infiltrate left and right lung with mild cardiomegaly   Objective:  Vital signs in last 24 hours:  Temp:  [97.8 F (36.6 C)-98.5 F (36.9 C)] 97.8 F (36.6 C) (03/09 0847) Pulse Rate:  [54-71] 54 (03/09 0847) Resp:  [18] 18 (03/09 0847) BP: (141-160)/(76-81) 160/76 (03/09 0847) SpO2:  [100 %] 100 % (03/09 0847)  Weight change:  Filed Weights   07/17/18 2026 07/18/18 2125 07/19/18 2116  Weight: 64.6 kg 64.5 kg 64.4 kg    Intake/Output: I/O last 3 completed shifts: In: 1165.1 [P.O.:990; IV Piggyback:175.1] Out: 4375 [Urine:4375]   Intake/Output this shift:  Total I/O In: -  Out: 650  [Urine:650] Alert pleasant gentleman no obvious distress CVS- RRR murmurs rubs or gallops RS- CTA no wheezes or rales ABD- BS present soft non-distended EXT- no edema   Basic Metabolic Panel: Recent Labs  Lab 07/17/18 0539 07/18/18 0453 07/19/18 0455 07/20/18 0533 07/21/18 0540  NA 138 139 140 139 137  K 4.0 4.0 4.3 4.3 4.4  CL 109 110 112* 110 106  CO2 17* 17* 19* 19* 17*  GLUCOSE 93 101* 107* 99 205*  BUN 118* 115* 114* 114* 123*  CREATININE 5.94* 6.30* 6.04* 5.73* 6.32*  CALCIUM 8.5* 9.0 8.7* 8.9 9.2  PHOS  --  6.3*  --   --   --     Liver Function Tests: Recent Labs  Lab 07/16/18 1836 07/18/18 0453  AST 16  --   ALT 19  --   ALKPHOS 44  --   BILITOT 0.4  --   PROT 7.2  --   ALBUMIN 3.3* 3.0*   No results for input(s): LIPASE, AMYLASE in the last 168 hours. No results for input(s): AMMONIA in the last 168 hours.  CBC: Recent Labs  Lab 07/16/18 1836 07/17/18 0539 07/17/18 0801 07/17/18 1844 07/18/18 0848 07/19/18 0455 07/21/18 0540  WBC 2.8* 2.8* 2.9*  --  3.3* 3.6* 4.7  NEUTROABS 2.3  --   --   --  2.6 2.7  --   HGB 4.7* 6.5* 6.4* 7.2* 7.4* 7.8* 8.2*  HCT 15.2* 19.3* 18.5* 21.8* 22.8* 24.0* 24.6*  MCV 92.1 87.7 87.3  --  88.4 88.6 88.2  PLT 124* 113* 91*  --  112* 126* 128*    Cardiac Enzymes: No results for input(s): CKTOTAL, CKMB, CKMBINDEX, TROPONINI in the last 168 hours.  BNP: Invalid input(s): POCBNP  CBG: No results for input(s): GLUCAP in the last 168 hours.  Microbiology: Results for orders placed or performed during the hospital encounter of 11/02/13  MRSA PCR Screening     Status: None   Collection Time: 11/02/13 10:31 PM  Result Value Ref Range Status   MRSA by PCR NEGATIVE NEGATIVE Final    Comment:        The GeneXpert MRSA Assay (FDA approved for NASAL specimens only), is one component of a comprehensive MRSA colonization surveillance program. It is not intended to diagnose MRSA infection nor to guide or monitor  treatment for MRSA infections.    Coagulation Studies: No results for input(s): LABPROT, INR in the last 72 hours.  Urinalysis: No results for input(s): COLORURINE, LABSPEC, PHURINE, GLUCOSEU, HGBUR, BILIRUBINUR, KETONESUR, PROTEINUR, UROBILINOGEN, NITRITE, LEUKOCYTESUR in the last 72 hours.  Invalid input(s): APPERANCEUR    Imaging: No results found.   Medications:   . ferric gluconate (FERRLECIT/NULECIT) IV 125 mg (07/20/18 1029)  . methylPREDNISolone (SOLU-MEDROL) injection 1,000 mg (07/21/18 0947)   . sodium chloride   Intravenous Once  . folic acid  1 mg Oral Daily  . multivitamin with minerals  1 tablet Oral Daily  . pantoprazole  40 mg Oral Daily  . senna-docusate  1 tablet Oral BID  . sodium bicarbonate  1,300 mg Oral BID  . thiamine  100 mg Oral Daily   Or  . 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  IV Solu-Medrol administered 1 g daily x3 doses will need to transition to oral prednisone 07/23/2018.  Will await renal biopsy before initiating cytotoxic agents.  Working diagnosis is acute vasculitis possibly drug-induced.  No indication for absolute dialysis at this time we will continue to follow.  Although I am suspicious that we will need dialysis.  Nonischemic cardiomyopathy last 2D echo was 2017 normal EF.  Recommend repeat 2D echo.  Pancytopenia fixation and SPEP pending.  Hemoglobin 4.7 on admission 3 units packed red blood cells transfused.  Metabolic acidosis continue sodium bicarbonate  Hypertension/volume appears euvolemic   LOS: 5 Sherril Croon @TODAY @10 :26 AM

## 2018-07-21 NOTE — Progress Notes (Signed)
Chief Complaint: Patient was seen in consultation today for renal biopsy at the request of Dr. Carolin Sicks  Referring Physician(s): Dr. Carolin Sicks  Supervising Physician: Corrie Mckusick  Patient Status: [facility] - In-pt  History of Present Illness: Jeremy Grant is a 58 y.o. male being worked up for MPO vasculitis with renal involvement. He is referred for random renal biopsy. PMHx, meds, labs, imaging, allergies reviewed. Feels well, no recent fevers, chills, illness. Family at bedside.   Past Medical History:  Diagnosis Date  . Arthritis   . CHF (congestive heart failure) ([place])   . Combined systolic and diastolic cardiac dysfunction    Echo 11/03/2013 EF 07%, grade 3 diastolic dysfunction  . Hypertension   . NICM (nonischemic cardiomyopathy) ([place])    a. L/RHC (11/05/13): RA: 3, RV 52/5, PA 49/19 (31), PCWP 10, AO 166/93, PA 67%, Fick CO/CI: 5.71/2.97, Lmain: normal, LAD: large, without signficant dz, first diagonal has 20% dz at ostium, LCx: normal, RCA: 30% stenosis at the bifurcation of PDA and PLOM    Past Surgical History:  Procedure Laterality Date  . LEFT AND RIGHT HEART CATHETERIZATION WITH CORONARY ANGIOGRAM N/A 11/05/2013   Procedure: LEFT AND RIGHT HEART CATHETERIZATION WITH CORONARY ANGIOGRAM;  Surgeon: Peter M Martinique, MD;  Location: [facility] CATH LAB;  Service: Cardiovascular;  Laterality: N/A;  . RIGHT HEART CATHETERIZATION N/A 01/14/2014   Procedure: RIGHT HEART CATH;  Surgeon: Larey Dresser, MD;  Location: [facility] CATH LAB;  Service: Cardiovascular;  Laterality: N/A;    Allergies: Hydralazine hcl  Medications:  Current Facility-Administered Medications:  .  0.9 %  sodium chloride infusion (Manually program via Guardrails IV Fluids), , Intravenous, Once, Pattricia Boss, MD .  acetaminophen (TYLENOL) tablet 650 mg, 650 mg, Oral, Q6H PRN **OR** acetaminophen (TYLENOL) suppository 650 mg, 650 mg, Rectal, Q6H PRN, Etta Quill, DO .  ferric gluconate (NULECIT) 125 mg in  sodium chloride 0.9 % 100 mL IVPB, 125 mg, Intravenous, Daily, Rosita Fire, MD, Last Rate: 110 mL/hr at 07/20/18 1029, 125 mg at 37/10/62 6948 .  folic acid (FOLVITE) tablet 1 mg, 1 mg, Oral, Daily, Jennette Kettle M, DO, 1 mg at 07/20/18 2123 .  methylPREDNISolone sodium succinate (SOLU-MEDROL) 1,000 mg in sodium chloride 0.9 % 50 mL IVPB, 1,000 mg, Intravenous, Daily, Rosita Fire, MD, Last Rate: 58 mL/hr at 07/21/18 0947, 1,000 mg at 07/21/18 0947 .  multivitamin with minerals tablet 1 tablet, 1 tablet, Oral, Daily, Etta Quill, DO, 1 tablet at 07/20/18 2123 .  ondansetron (ZOFRAN) tablet 4 mg, 4 mg, Oral, Q6H PRN **OR** ondansetron (ZOFRAN) injection 4 mg, 4 mg, Intravenous, Q6H PRN, Alcario Drought, Jared M, DO .  pantoprazole (PROTONIX) EC tablet 40 mg, 40 mg, Oral, Daily, Rosita Fire, MD, 40 mg at 07/21/18 0941 .  senna-docusate (Senokot-S) tablet 1 tablet, 1 tablet, Oral, BID, Florencia Reasons, MD .  sodium bicarbonate tablet 1,300 mg, 1,300 mg, Oral, BID, Rosita Fire, MD, 1,300 mg at 07/21/18 0941 .  thiamine (VITAMIN B-1) tablet 100 mg, 100 mg, Oral, Daily, 100 mg at 07/20/18 2123 **OR** thiamine (B-1) injection 100 mg, 100 mg, Intravenous, Daily, Etta Quill, DO    Family History  Problem Relation Age of Onset  . Heart attack Father   . Heart disease Father   . Arrhythmia Father   . Hypertension Brother   . Hypertension Brother     Social History   Socioeconomic History  . Marital status: Married    Spouse name:  BRENDA  . Number of children: 0  . Years of education: 74  . Highest education level: 12th grade  Occupational History  . Occupation: Medical sales representative  Social Needs  . Financial resource strain: Not on file  . Food insecurity:    Worry: Never true    Inability: Never true  . Transportation needs:    Medical: No    Non-medical: No  Tobacco Use  . Smoking status: Never Smoker  . Smokeless tobacco: Never Used  Substance and Sexual  Activity  . Alcohol use: Yes    Alcohol/week: 3.0 standard drinks    Types: 3 Glasses of wine per week  . Drug use: No  . Sexual activity: Not Currently    Birth control/protection: None  Lifestyle  . Physical activity:    Days per week: 5 days    Minutes per session: 30 min  . Stress: Not at all  Relationships  . Social connections:    Talks on phone: More than three times a week    Gets together: Twice a week    Attends religious service: Never    Active member of club or organization: No    Attends meetings of clubs or organizations: Never    Relationship status: Married  Other Topics Concern  . Not on file  Social History Narrative  . Not on file     Review of Systems: A 12 point ROS discussed and pertinent positives are indicated in the HPI above.  All other systems are negative.  Review of Systems  Vital Signs: BP (!) 160/76 (BP Location: Left Arm)   Pulse (!) 54   Temp 97.8 F (36.6 C) (Oral)   Resp 18   Ht 6\' 1"  (1.854 m)   Wt 64.4 kg   SpO2 100%   BMI 18.73 kg/m   Physical Exam Constitutional:      Appearance: Normal appearance.  HENT:     Mouth/Throat:     Mouth: Mucous membranes are moist.     Pharynx: Oropharynx is clear.  Cardiovascular:     Rate and Rhythm: Normal rate and regular rhythm.     Heart sounds: Normal heart sounds.  Pulmonary:     Effort: Pulmonary effort is normal. No respiratory distress.     Breath sounds: Normal breath sounds.  Abdominal:     General: Abdomen is flat. There is no distension.     Palpations: There is no mass.     Tenderness: There is no abdominal tenderness.  Skin:    General: Skin is warm and dry.  Neurological:     General: No focal deficit present.     Mental Status: He is alert and oriented to person, place, and time.  Psychiatric:        Mood and Affect: Mood normal.        Judgment: Judgment normal.       Imaging: US Renal  Result Date: 07/17/2018 CLINICAL DATA:  Acute renal failure EXAM:  RENAL / URINARY TRACT ULTRASOUND COMPLETE COMPARISON:  None. FINDINGS: Right Kidney: Renal measurements: 12.5 x 5.2 x 5.6 cm = volume: 191.9 mL. Mild increased renal cortical echogenicity. No hydronephrosis. Left Kidney: Renal measurements: 13.2 x 6.2 x 6.6 cm = volume: 281.2 mL. Mild increased renal cortical echogenicity. No hydronephrosis. Bladder: Appears normal for degree of bladder distention. IMPRESSION: No hydronephrosis. Mild increased renal cortical echogenicity as can be seen with chronic medical renal disease. Electronically Signed   By: Polly Cobia.D.  On: 07/17/2018 09:29   Dg Chest Port 1 View  Result Date: 07/16/2018 CLINICAL DATA:  Weakness EXAM: PORTABLE CHEST 1 VIEW COMPARISON:  06/22/2015 FINDINGS: Cardiomegaly. There may be patchy ground-glass opacity in the left mid lung and right infrahilar lung. No pneumothorax. IMPRESSION: 1. Suspected ground-glass infiltrates in the left mid lung and right infrahilar lung. 2. Mild cardiomegaly Electronically Signed   By: Donavan Foil M.D.   On: 07/16/2018 21:20    Labs:  CBC: Recent Labs    07/17/18 0801 07/17/18 1844 07/18/18 0848 07/19/18 0455 07/21/18 0540  WBC 2.9*  --  3.3* 3.6* 4.7  HGB 6.4* 7.2* 7.4* 7.8* 8.2*  HCT 18.5* 21.8* 22.8* 24.0* 24.6*  PLT 91*  --  112* 126* 128*    COAGS: No results for input(s): INR, APTT in the last 8760 hours.  BMP: Recent Labs    07/18/18 0453 07/19/18 0455 07/20/18 0533 07/21/18 0540  NA 139 140 139 137  K 4.0 4.3 4.3 4.4  CL 110 112* 110 106  CO2 17* 19* 19* 17*  GLUCOSE 101* 107* 99 205*  BUN 115* 114* 114* 123*  CALCIUM 9.0 8.7* 8.9 9.2  CREATININE 6.30* 6.04* 5.73* 6.32*  GFRNONAA 9* 9* 10* 9*  GFRAA 10* 11* 12* 10*    LIVER FUNCTION TESTS: Recent Labs    07/16/18 1836 07/18/18 0453  BILITOT 0.4  --   AST 16  --   ALT 19  --   ALKPHOS 44  --   PROT 7.2  --   ALBUMIN 3.3* 3.0*    TUMOR MARKERS: No results for input(s): AFPTM, CEA, CA199, CHROMGRNA in the  last 8760 hours.  Assessment and Plan: MPO vasculitis with renal insufficiency For US guided random renal biopsy. Due to heavy caseload schedule, cannot accommodate biopsy today. Have allowed pt diet today. Will plan for procedure tomorrow. Risks and benefits of renal biopsy was discussed with the patient and/or patient's family including, but not limited to bleeding, infection, damage to adjacent structures or low yield requiring additional tests.  All of the questions were answered and there is agreement to proceed.  Consent signed and in chart.    Thank you for this interesting consult.  I greatly enjoyed meeting Jeremy Grant and look forward to participating in their care.  A copy of this report was sent to the requesting provider on this date.  Electronically Signed: Ascencion Dike, PA-C 07/21/2018, 10:46 AM   I spent a total of 20 minutes in face to face in clinical consultation, greater than 50% of which was counseling/coordinating care for renal biopsy

## 2018-07-22 ENCOUNTER — Inpatient Hospital Stay (HOSPITAL_COMMUNITY): Payer: BLUE CROSS/BLUE SHIELD

## 2018-07-22 DIAGNOSIS — C9000 Multiple myeloma not having achieved remission: Secondary | ICD-10-CM

## 2018-07-22 LAB — BASIC METABOLIC PANEL
Anion gap: 14 (ref 5–15)
BUN: 129 mg/dL — AB (ref 6–20)
CO2: 19 mmol/L — ABNORMAL LOW (ref 22–32)
Calcium: 9.2 mg/dL (ref 8.9–10.3)
Chloride: 103 mmol/L (ref 98–111)
Creatinine, Ser: 6.38 mg/dL — ABNORMAL HIGH (ref 0.61–1.24)
GFR calc Af Amer: 10 mL/min — ABNORMAL LOW (ref >60)
GFR calc non Af Amer: 9 mL/min — ABNORMAL LOW (ref >60)
Glucose, Bld: 231 mg/dL — ABNORMAL HIGH (ref 70–99)
Potassium: 4.4 mmol/L (ref 3.5–5.1)
Sodium: 136 mmol/L (ref 135–145)

## 2018-07-22 LAB — CBC
HCT: 25.6 % — ABNORMAL LOW (ref 39.0–52.0)
Hemoglobin: 8.3 g/dL — ABNORMAL LOW (ref 13.0–17.0)
MCH: 28.6 pg (ref 26.0–34.0)
MCHC: 32.4 g/dL (ref 30.0–36.0)
MCV: 88.3 fL (ref 80.0–100.0)
Platelets: 112 10*3/uL — ABNORMAL LOW (ref 150–400)
RBC: 2.9 MIL/uL — ABNORMAL LOW (ref 4.22–5.81)
RDW: 13.1 % (ref 11.5–15.5)
WBC: 6.5 10*3/uL (ref 4.0–10.5)
nRBC: 0 % (ref 0.0–0.2)

## 2018-07-22 LAB — PROTIME-INR
INR: 1.1 (ref 0.8–1.2)
Prothrombin Time: 14.5 seconds (ref 11.4–15.2)

## 2018-07-22 LAB — ANCA TITERS
Atypical P-ANCA titer: <1:20 {titer}
P-ANCA: 1:640 {titer}

## 2018-07-22 MED ORDER — HYDRALAZINE HCL 20 MG/ML IJ SOLN
INTRAMUSCULAR | Status: AC | PRN
  Administered 2018-07-22 (×2): 10 mg via INTRAVENOUS

## 2018-07-22 MED ORDER — HYDRALAZINE HCL 20 MG/ML IJ SOLN
INTRAMUSCULAR | Status: AC
  Filled 2018-07-22: qty 1

## 2018-07-22 MED ORDER — LIDOCAINE HCL (PF) 1 % IJ SOLN
INTRAMUSCULAR | Status: AC
  Filled 2018-07-22: qty 30

## 2018-07-22 MED ORDER — MIDAZOLAM HCL 2 MG/2ML IJ SOLN
INTRAMUSCULAR | Status: AC | PRN
  Administered 2018-07-22 (×2): 1 mg via INTRAVENOUS

## 2018-07-22 MED ORDER — FENTANYL CITRATE (PF) 100 MCG/2ML IJ SOLN
INTRAMUSCULAR | Status: AC | PRN
  Administered 2018-07-22: 25 ug via INTRAVENOUS
  Administered 2018-07-22: 50 ug via INTRAVENOUS

## 2018-07-22 MED ORDER — MIDAZOLAM HCL 2 MG/2ML IJ SOLN
INTRAMUSCULAR | Status: AC
  Filled 2018-07-22: qty 2

## 2018-07-22 MED ORDER — HYDRALAZINE HCL 20 MG/ML IJ SOLN
INTRAMUSCULAR | Status: AC | PRN
  Administered 2018-07-22: 10 mg via INTRAVENOUS

## 2018-07-22 MED ORDER — PREDNISONE 50 MG PO TABS
60.0000 mg | ORAL_TABLET | Freq: Every day | ORAL | Status: DC
  Administered 2018-07-23 – 2018-08-04 (×13): 60 mg via ORAL
  Filled 2018-07-22 (×13): qty 1

## 2018-07-22 MED ORDER — FENTANYL CITRATE (PF) 100 MCG/2ML IJ SOLN
INTRAMUSCULAR | Status: AC
  Filled 2018-07-22: qty 2

## 2018-07-22 NOTE — Progress Notes (Signed)
[facility] ROUNDING NOTE   Subjective:   History this is a very pleasant gentleman with stage III chronic kidney disease 58 years of age history of alcohol use and congestive heart failure.  He was admitted with acute kidney injury and a nephritic appearing urine sediment.  His serology revealed an elevated MPO antibody of greater than 100 anti-histone antibody positive and his anti-GBM antibody was negative.  His kappa lambda ratio was also elevated.  Some of his other serologies were negative including an ANA hepatitis B C HIV and ASO.  He is scheduled for renal biopsy 07/22/2018.  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  His hydralazine has been discontinued.  Baseline serum creatinine appears to be about 1.3.  Creatinine on admission was 6.23.  Blood pressure 182/69 pulse 53 temperature 97.6 O2 sats 100% room air  Sodium 136 potassium 4.4 chloride 103 CO2 19 BUN 129 creatinine 6.38 glucose 231 calcium 9.2 WBC 6.5 hemoglobin 8.3 platelets 112  Urine output 1.75 L.  Weight 64.4 kg  Solu-Medrol 1 g daily first dose 07/20/2018 for 3 doses.  Will need to transition to oral prednisone 60 mg daily 07/23/2018, sodium bicarbonate 1.3 g twice daily, Protonix 40 mg daily, folic acid 1 mg daily   iron 125 mg x 5 doses, multivitamins 1 daily  Chest x-ray showed suspected groundglass infiltrate left and right lung with mild cardiomegaly   Objective:  Vital signs in last 24 hours:  Temp:  [97.6 F (36.4 C)-97.8 F (36.6 C)] 97.6 F (36.4 C) (03/10 0852) Pulse Rate:  [53-70] 53 (03/10 0852) Resp:  [18] 18 (03/10 0852) BP: (166-182)/(69-86) 182/69 (03/10 0852) SpO2:  [100 %] 100 % (03/10 0852) Weight:  [64.4 kg] 64.4 kg (03/09 2122)  Weight change:  Filed Weights   07/18/18 2125 07/19/18 2116 07/21/18 2122  Weight: 64.5 kg 64.4 kg 64.4 kg    Intake/Output: I/O last 3 completed shifts: In: 1814.3 [P.O.:1580; IV Piggyback:234.3] Out: 3650 [Urine:3650]    Intake/Output this shift:  No intake/output data recorded. Alert pleasant gentleman no obvious distress CVS- RRR murmurs rubs or gallops RS- CTA no wheezes or rales ABD- BS present soft non-distended EXT- no edema   Basic Metabolic Panel: Recent Labs  Lab 07/18/18 0453 07/19/18 0455 07/20/18 0533 07/21/18 0540 07/22/18 0420  NA 139 140 139 137 136  K 4.0 4.3 4.3 4.4 4.4  CL 110 112* 110 106 103  CO2 17* 19* 19* 17* 19*  GLUCOSE 101* 107* 99 205* 231*  BUN 115* 114* 114* 123* 129*  CREATININE 6.30* 6.04* 5.73* 6.32* 6.38*  CALCIUM 9.0 8.7* 8.9 9.2 9.2  PHOS 6.3*  --   --   --   --     Liver Function Tests: Recent Labs  Lab 07/16/18 1836 07/18/18 0453  AST 16  --   ALT 19  --   ALKPHOS 44  --   BILITOT 0.4  --   PROT 7.2  --   ALBUMIN 3.3* 3.0*   No results for input(s): LIPASE, AMYLASE in the last 168 hours. No results for input(s): AMMONIA in the last 168 hours.  CBC: Recent Labs  Lab 07/16/18 1836  07/17/18 0801 07/17/18 1844 07/18/18 0848 07/19/18 0455 07/21/18 0540 07/22/18 0420  WBC 2.8*   < > 2.9*  --  3.3* 3.6* 4.7 6.5  NEUTROABS 2.3  --   --   --  2.6 2.7  --   --   HGB 4.7*   < >  6.4* 7.2* 7.4* 7.8* 8.2* 8.3*  HCT 15.2*   < > 18.5* 21.8* 22.8* 24.0* 24.6* 25.6*  MCV 92.1   < > 87.3  --  88.4 88.6 88.2 88.3  PLT 124*   < > 91*  --  112* 126* 128* 112*   < > = values in this interval not displayed.    Cardiac Enzymes: No results for input(s): CKTOTAL, CKMB, CKMBINDEX, TROPONINI in the last 168 hours.  BNP: Invalid input(s): POCBNP  CBG: No results for input(s): GLUCAP in the last 168 hours.  Microbiology: Results for orders placed or performed during the hospital encounter of 11/02/13  MRSA PCR Screening     Status: None   Collection Time: 11/02/13 10:31 PM  Result Value Ref Range Status   MRSA by PCR NEGATIVE NEGATIVE Final    Comment:        The GeneXpert MRSA Assay (FDA approved for NASAL specimens only), is one component of  a comprehensive MRSA colonization surveillance program. It is not intended to diagnose MRSA infection nor to guide or monitor treatment for MRSA infections.    Coagulation Studies: Recent Labs    07/22/18 0420  LABPROT 14.5  INR 1.1    Urinalysis: No results for input(s): COLORURINE, LABSPEC, PHURINE, GLUCOSEU, HGBUR, BILIRUBINUR, KETONESUR, PROTEINUR, UROBILINOGEN, NITRITE, LEUKOCYTESUR in the last 72 hours.  Invalid input(s): APPERANCEUR    Imaging: No results found.   Medications:   . ferric gluconate (FERRLECIT/NULECIT) IV 125 mg (07/21/18 1120)  . methylPREDNISolone (SOLU-MEDROL) injection 1,000 mg (07/21/18 0947)   . sodium chloride   Intravenous Once  . folic acid  1 mg Oral Daily  . multivitamin with minerals  1 tablet Oral Daily  . pantoprazole  40 mg Oral Daily  . senna-docusate  1 tablet Oral BID  . sodium bicarbonate  1,300 mg Oral BID  . thiamine  100 mg Oral Daily   Or  . 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  IV Solu-Medrol administered 1 g daily x3 doses will need to transition to oral prednisone 07/23/2018 we will start 60 mg daily..  Will await renal biopsy before initiating cytotoxic agents. He does have positive M spike.  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  Will order bone survey.  No indication for absolute dialysis at this time we will continue to follow.  Although I am suspicious that we will need dialysis.  His creatinine appears to be increasing as this is BUN.  He is asymptomatic with no uremic signs and symptoms at this point  Nonischemic cardiomyopathy last 2D echo was 2017 normal EF.  Recommend repeat 2D echo.  Pancytopenia fixation positive M spike urine .  Hemoglobin 4.7 on admission 3 units packed red blood cells transfused.  Metabolic acidosis continue sodium  bicarbonate  Hypertension/volume appears euvolemic   LOS: 6 Sherril Croon @TODAY @8 :58 AM

## 2018-07-22 NOTE — Progress Notes (Signed)
PROGRESS NOTE  LARNIE HEART NLG:[id number] DOB: 1961/02/22 DOA: 07/16/2018 PCP: Hayden Rasmussen, MD  HPI/Recap of past 24 hours:   He is started on high dose steroids, he is npo awaiting renal biopsy  He denies pain, no sob, no edema No overt bleeding, no fever   Assessment/Plan: Principal Problem:   Acute kidney failure ([place]) Active Problems:   Malignant hypertension   NICM (nonischemic cardiomyopathy) (H[facility])   History of ETOH abuse   Chronic combined systolic and diastolic CHF (congestive heart failure) (HCC)   Normocytic normochromic anemia   Pancytopenia ([facility])  AKI/metabolic acidosis -unclear etiology , euvolemic with good urine output, no obstruction by renal US, ua + rbc, no gross hematuria -+ antihistone antibody, low complement, 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 Solumedrol per nephrology -also has small m spike on spep/upep,  Slightly elevated kappal/amda ration, will get bone survey, hematology /oncology Dr Julien Nordmann notified, will do formal consult. - renal biopsy planned on 3/10 - nephrology input appreciated.  Pancytopenia Anemia/thrombocytopenia/neutropenia, improving hgb 4.7 on presentation,s/p 3prbc transfusion, hgb appropriately improved to above 7 , s/p iv iron as well FOBT negative, he denies hematuria, no overt sign of bleeding, tbili 0.4 Small m spike on spep/upep, slightly elevated kappa/lamda ratio  blood smear ordered, bone survey ordered  hematology /oncology formally consulted  H/o nonischemic cardiomyopathy, possible due to combination of uncontrolled HTN and ETOH -lvef has normalized ( last echo in 2017), he report quit drinking, he reports bp normal runs around 150's at home, he run out two of the bp meds, he has been on hydralazine and coreg at home. He stop taking lasix for tow yrs after ef normalized. Hydralazine held due to concerns of drug induced SLE (antihisone antibody  +), patient advised to avoid hydralazine in the futher  unintentional weight loss by reports Body mass index is 18.73 kg/m.   Code Status: full  Family Communication: patient and wife at bedside on 3/9  Disposition Plan: home in 1-2 days, Need nephrology and oncology clearance    Consultants:  Nephrology  IR  Hematology/oncology  Procedures:  prbc transfusion  Iv iron  Renal biopsy planned on 3/10  Antibiotics:  none   Objective: BP (!) 182/74   Pulse (!) 54   Temp 97.6 F (36.4 C) (Oral)   Resp 18   Ht 6\' 1"  (1.854 m)   Wt 64.4 kg   SpO2 100%   BMI 18.73 kg/m   Intake/Output Summary (Last 24 hours) at 07/22/2018 1423 Last data filed at 07/22/2018 1358 Gross per 24 hour  Intake 931.81 ml  Output 3070 ml  Net -2138.19 ml   Filed Weights   07/18/18 2125 07/19/18 2116 07/21/18 2122  Weight: 64.5 kg 64.4 kg 64.4 kg    Exam: Patient is examined daily including today on 07/22/2018, exams remain the same as of yesterday except that has changed    General:  NAD  Cardiovascular: RRR  Respiratory: CTABL  Abdomen: Soft/ND/NT, positive BS  Musculoskeletal: No Edema  Neuro: alert, oriented   Data Reviewed: Basic Metabolic Panel: Recent Labs  Lab 07/18/18 0453 07/19/18 0455 07/20/18 0533 07/21/18 0540 07/22/18 0420  NA 139 140 139 137 136  K 4.0 4.3 4.3 4.4 4.4  CL 110 112* 110 106 103  CO2 17* 19* 19* 17* 19*  GLUCOSE 101* 107* 99 205* 231*  BUN 115* 114* 114* 123* 129*  CREATININE 6.30* 6.04* 5.73* 6.32* 6.38*  CALCIUM 9.0 8.7*  8.9 9.2 9.2  PHOS 6.3*  --   --   --   --    Liver Function Tests: Recent Labs  Lab 07/16/18 1836 07/18/18 0453  AST 16  --   ALT 19  --   ALKPHOS 44  --   BILITOT 0.4  --   PROT 7.2  --   ALBUMIN 3.3* 3.0*   No results for input(s): LIPASE, AMYLASE in the last 168 hours. No results for input(s): AMMONIA in the last 168 hours. CBC: Recent Labs  Lab 07/16/18 1836  07/17/18 0801 07/17/18 1844  07/18/18 0848 07/19/18 0455 07/21/18 0540 07/22/18 0420  WBC 2.8*   < > 2.9*  --  3.3* 3.6* 4.7 6.5  NEUTROABS 2.3  --   --   --  2.6 2.7  --   --   HGB 4.7*   < > 6.4* 7.2* 7.4* 7.8* 8.2* 8.3*  HCT 15.2*   < > 18.5* 21.8* 22.8* 24.0* 24.6* 25.6*  MCV 92.1   < > 87.3  --  88.4 88.6 88.2 88.3  PLT 124*   < > 91*  --  112* 126* 128* 112*   < > = values in this interval not displayed.   Cardiac Enzymes:   No results for input(s): CKTOTAL, CKMB, CKMBINDEX, TROPONINI in the last 168 hours. BNP (last 3 results) No results for input(s): BNP in the last 8760 hours.  ProBNP (last 3 results) No results for input(s): PROBNP in the last 8760 hours.  CBG: No results for input(s): GLUCAP in the last 168 hours.  No results found for this or any previous visit (from the past 240 hour(s)).   Studies: No results found.  Scheduled Meds: . sodium chloride   Intravenous Once  . folic acid  1 mg Oral Daily  . multivitamin with minerals  1 tablet Oral Daily  . [START ON 07/23/2018] predniSONE  60 mg Oral Q breakfast  . senna-docusate  1 tablet Oral BID  . sodium bicarbonate  1,300 mg Oral BID  . thiamine  100 mg Oral Daily   Or  . thiamine  100 mg Intravenous Daily    Continuous Infusions:    Time spent: 9mins, case discussed with nephrology and oncology I have personally reviewed and interpreted on  07/22/2018 daily labs,  imagings as discussed above under date review session and assessment and plans.  I reviewed all nursing notes, pharmacy notes, consultant notes,  vitals, pertinent old records  I have discussed plan of care as described above with RN , patient on 07/22/2018   Florencia Reasons MD, PhD  [facility] Pager [phone]. If 7PM-7AM, please contact night-coverage at [email or web address], password [facility] 07/22/2018, 2:23 PM  LOS: 6 days

## 2018-07-22 NOTE — Consult Note (Addendum)
Jeremy Grant  [phone] Fax:([phone] [place]  Referral MD:  Dr. Florencia Reasons  Reason for Referral: Pancytopenia with elevated M spike and elevated kappa/lambda ratio  HPI: Jeremy Grant is a 58 y.o. male with medical history significant of HTN, EtOH abuse, NICM had EF 20% with grade 3 diastolic dysfunction back in 2015 which normalized with normal repeat echos in 2015 and 2017.  The patient presented with new onset anemia and renal failure based on labs obtained by his primary care provider.  He was advised to seek evaluation in the emergency room.  On admission, his hemoglobin was 4.7, creatinine 6.23, with a BUN of 120.  Hemoccult was negative. On admission, patient reports that he has had some generalized weakness. He denies any headache, head injury, chest pain, dyspnea, abdominal pain, nausea, vomiting, diarrhea, or weight change.  Denies melena or hematochezia. He states he has had some increased urine output. He has been been taking his medications as prescribed.  Reports joint aches and pains over the past couple of months. Additionally has significant cold intolerance.  Serum protein electrophoresis showed an M spike of 0.4.  Kappa free light chain was elevated to 2.1, lambda free light chain elevated at 113.0, and kappa, lambda light chain ratio was elevated at 1.79.  UPEP showed an elevated free kappa light chain at 286.31 and a free lambda light chain at 51.86.  LDH was not elevated.  Haptoglobin normal.  When seen today, the patient reports that he has ongoing fatigue.  Had no symptoms prior to admission other than pain in his hands and generalized weakness.  He was sent to the hospital based on routine lab work done by his primary care provider.  He reports that his appetite has been good.  He has lost about 10 pounds in the past month.  Reports occasional night sweats.  Denies fevers and chills.  Denies chest pain,  shortness of breath, cough, hemoptysis.  Denies nausea, vomiting, constipation, diarrhea.  Noted mild edema in his feet.  Denies bleeding.  Oncology was consulted for further recommendations regarding his elevated M spike and light chains.   Past Medical History:  Diagnosis Date  . Arthritis   . CHF (congestive heart failure) ([place])   . Combined systolic and diastolic cardiac dysfunction    Echo 11/03/2013 EF 78%, grade 3 diastolic dysfunction  . Hypertension   . NICM (nonischemic cardiomyopathy) ([place])    a. L/RHC (11/05/13): RA: 3, RV 52/5, PA 49/19 (31), PCWP 10, AO 166/93, PA 67%, Fick CO/CI: 5.71/2.97, Lmain: normal, LAD: large, without signficant dz, first diagonal has 20% dz at ostium, LCx: normal, RCA: 30% stenosis at the bifurcation of PDA and PLOM  :  Past Surgical History:  Procedure Laterality Date  . LEFT AND RIGHT HEART CATHETERIZATION WITH CORONARY ANGIOGRAM N/A 11/05/2013   Procedure: LEFT AND RIGHT HEART CATHETERIZATION WITH CORONARY ANGIOGRAM;  Surgeon: Peter M Martinique, MD;  Location[facility] CATH LAB;  Service: Cardiovascular;  Laterality: N/A;  . RIGHT HEART CATHETERIZATION N/A 01/14/2014   Procedure: RIGHT HEART CATH;  Surgeon: Larey Dresser, MD;  Location: [facility] CATH LAB;  Service: Cardiovascular;  Laterality: N/A;  :  Current Facility-Administered Medications  Medication Dose Route Frequency Provider Last Rate Last Dose  . 0.9 %  sodium chloride infusion (Manually program via Guardrails IV Fluids)   Intravenous Once Pattricia Boss, MD      . acetaminophen (TYLENOL) tablet 650 mg  650  mg Oral Q6H PRN Etta Quill, DO       Or  . acetaminophen (TYLENOL) suppository 650 mg  650 mg Rectal Q6H PRN Etta Quill, DO      . folic acid (FOLVITE) tablet 1 mg  1 mg Oral Daily Jennette Kettle M, DO   1 mg at 07/21/18 2226  . multivitamin with minerals tablet 1 tablet  1 tablet Oral Daily Jennette Kettle M, DO   1 tablet at 07/21/18 2225  . ondansetron (ZOFRAN) tablet 4 mg  4 mg Oral  Q6H PRN Etta Quill, DO       Or  . ondansetron [facility]) injection 4 mg  4 mg Intravenous Q6H PRN Etta Quill, DO      . [START ON 07/23/2018] predniSONE (DELTASONE) tablet 60 mg  60 mg Oral Q breakfast Edrick Oh, MD      . senna-docusate (Senokot-S) tablet 1 tablet  1 tablet Oral BID Florencia Reasons, MD      . sodium bicarbonate tablet 1,300 mg  1,300 mg Oral BID Rosita Fire, MD   1,300 mg at 07/22/18 1021  . thiamine (VITAMIN B-1) tablet 100 mg  100 mg Oral Daily Jennette Kettle M, DO   100 mg at 07/21/18 2225   Or  . thiamine (B-1) injection 100 mg  100 mg Intravenous Daily Jennette Kettle M, DO         Allergies  Allergen Reactions  . Hydralazine Hcl   :  Family History  Problem Relation Age of Onset  . Heart attack Father   . Heart disease Father   . Arrhythmia Father   . Hypertension Brother   . Hypertension Brother   :  Social History   Socioeconomic History  . Marital status: Married    Spouse name: BRENDA  . Number of children: 0  . Years of education: 10  . Highest education level: 12th grade  Occupational History  . Occupation: Medical sales representative  Social Needs  . Financial resource strain: Not on file  . Food insecurity:    Worry: Never true    Inability: Never true  . Transportation needs:    Medical: No    Non-medical: No  Tobacco Use  . Smoking status: Never Smoker  . Smokeless tobacco: Never Used  Substance and Sexual Activity  . Alcohol use: Yes    Alcohol/week: 3.0 standard drinks    Types: 3 Glasses of wine per week  . Drug use: No  . Sexual activity: Not Currently    Birth control/protection: None  Lifestyle  . Physical activity:    Days per week: 5 days    Minutes per session: 30 min  . Stress: Not at all  Relationships  . Social connections:    Talks on phone: More than three times a week    Gets together: Twice a week    Attends religious service: Never    Active member of club or organization: No    Attends meetings of  clubs or organizations: Never    Relationship status: Married  . Intimate partner violence:    Fear of current or ex partner: No    Emotionally abused: No    Physically abused: No    Forced sexual activity: No  Other Topics Concern  . Not on file  Social History Narrative  . Not on file    Constitutional: negative except for fatigue, night sweats and weight loss Eyes: negative Ears, nose, mouth, throat, and  face: negative Respiratory: negative Cardiovascular: negative for edema to feet. Gastrointestinal: negative Genitourinary:negative Hematologic/lymphatic: negative Musculoskeletal:negative Neurological: negative Behavioral/Psych: negative Endocrine: negative Allergic/Immunologic: negative  Exam: Patient Vitals for the past 24 hrs:  BP Temp Temp src Pulse Resp SpO2 Weight  07/22/18 1200 (!) 182/74 - - (!) 54 - - -  07/22/18 0852 (!) 182/69 97.6 F (36.4 C) Oral (!) 53 18 100 % -  07/22/18 0454 (!) 178/79 97.6 F (36.4 C) Oral (!) 56 18 100 % -  07/21/18 2122 (!) 166/78 97.8 F (36.6 C) Oral (!) 59 18 100 % 141 lb 15.6 oz (64.4 kg)  07/21/18 1817 (!) 167/86 97.8 F (36.6 C) Oral 70 18 100 % -    General:  well-nourished in no acute distress.  Eyes:  no scleral icterus.  ENT:  There were no oropharyngeal lesions.  Neck was without thyromegaly.  Lymphatics:  Negative cervical, supraclavicular or axillary adenopathy.  Respiratory: lungs were clear bilaterally without wheezing or crackles.  Cardiovascular:  Regular rate and rhythm, S1/S2, without murmur, rub or gallop.  There was no pedal edema.  GI:  abdomen was soft, flat, nontender, nondistended, without organomegaly.  Muscoloskeletal:  no spinal tenderness of palpation of vertebral spine.  Skin exam was without echymosis, petichae.  Neuro exam was nonfocal.  Patient was alert and oriented.  Attention was good.   Language was appropriate.  Mood was normal without depression.  Speech was not pressured.  Thought content was not  tangential.     Lab Results  Component Value Date   WBC 6.5 07/22/2018   HGB 8.3 (L) 07/22/2018   HCT 25.6 (L) 07/22/2018   PLT 112 (L) 07/22/2018   GLUCOSE 231 (H) 07/22/2018   CHOL 175 04/03/2016   TRIG 62 04/03/2016   HDL 57 04/03/2016   LDLCALC 106 (H) 04/03/2016   ALT 19 07/16/2018   AST 16 07/16/2018   NA 136 07/22/2018   K 4.4 07/22/2018   CL 103 07/22/2018   CREATININE 6.38 (H) 07/22/2018   BUN 129 (H) 07/22/2018   CO2 19 (L) 07/22/2018    US Renal  Result Date: 07/17/2018 CLINICAL DATA:  Acute renal failure EXAM: RENAL / URINARY TRACT ULTRASOUND COMPLETE COMPARISON:  None. FINDINGS: Right Kidney: Renal measurements: 12.5 x 5.2 x 5.6 cm = volume: 191.9 mL. Mild increased renal cortical echogenicity. No hydronephrosis. Left Kidney: Renal measurements: 13.2 x 6.2 x 6.6 cm = volume: 281.2 mL. Mild increased renal cortical echogenicity. No hydronephrosis. Bladder: Appears normal for degree of bladder distention. IMPRESSION: No hydronephrosis. Mild increased renal cortical echogenicity as can be seen with chronic medical renal disease. Electronically Signed   By: Lovey Newcomer M.D.   On: 07/17/2018 09:29   Dg Chest Port 1 View  Result Date: 07/16/2018 CLINICAL DATA:  Weakness EXAM: PORTABLE CHEST 1 VIEW COMPARISON:  06/22/2015 FINDINGS: Cardiomegaly. There may be patchy ground-glass opacity in the left mid lung and right infrahilar lung. No pneumothorax. IMPRESSION: 1. Suspected ground-glass infiltrates in the left mid lung and right infrahilar lung. 2. Mild cardiomegaly Electronically Signed   By: Donavan Foil M.D.   On: 07/16/2018 21:20   US Renal  Result Date: 07/17/2018 CLINICAL DATA:  Acute renal failure EXAM: RENAL / URINARY TRACT ULTRASOUND COMPLETE COMPARISON:  None. FINDINGS: Right Kidney: Renal measurements: 12.5 x 5.2 x 5.6 cm = volume: 191.9 mL. Mild increased renal cortical echogenicity. No hydronephrosis. Left Kidney: Renal measurements: 13.2 x 6.2 x 6.6 cm = volume:  281.2 mL.  Mild increased renal cortical echogenicity. No hydronephrosis. Bladder: Appears normal for degree of bladder distention. IMPRESSION: No hydronephrosis. Mild increased renal cortical echogenicity as can be seen with chronic medical renal disease. Electronically Signed   By: Lovey Newcomer M.D.   On: 07/17/2018 09:29   Dg Chest Port 1 View  Result Date: 07/16/2018 CLINICAL DATA:  Weakness EXAM: PORTABLE CHEST 1 VIEW COMPARISON:  06/22/2015 FINDINGS: Cardiomegaly. There may be patchy ground-glass opacity in the left mid lung and right infrahilar lung. No pneumothorax. IMPRESSION: 1. Suspected ground-glass infiltrates in the left mid lung and right infrahilar lung. 2. Mild cardiomegaly Electronically Signed   By: Donavan Foil M.D.   On: 07/16/2018 21:20    Assessment and Plan:   This is a pleasant 58 year old male who presented with anemia and acute kidney injury.  Labs have been reviewed.  He has a slightly elevated M spike at 0.4 and elevated serum and urine light chains.  Recommend further work-up with a bone marrow biopsy to rule out multiple myeloma.  Further recommendations pending the results of the bone marrow biopsy.  Thank you for this referral.   Mikey Bussing, DNP, AGPCNP-BC, AOCNP  ADDENDUM: Hematology/Oncology Attending: The patient is seen and examined today.  I agree with the above note.  This is a very pleasant 58 years old white male with history of congestive heart failure who was complaining of increasing fatigue and weakness when he was seen by his primary care physician recently.  The patient had blood work performed that showed significant anemia and renal failure.  He was admitted to the hospital.  During his evaluation he had several studies performed including serum protein electrophoresis with immune fixation that showed M spike of 0.4 but there was also elevated serum kappa and lambda light chain as well as urine light chains concerning for underlying plasma cell  dyscrasia. The patient also had ultrasound-guided renal biopsy yesterday.  The result is still pending. For the anemia he is receiving PRBCs transfusion. I recommended for the patient to proceed with a bone marrow biopsy and aspirate to rule out multiple myeloma. I do not see a need to proceed with a skeletal bone survey for now until the results of the bone marrow biopsy becomes available. He will continue with the current supportive care under nephrology and the hospitalist service for now. If the patient is discharged before the bone marrow biopsy results becomes available, I will arrange for him a follow-up appointment with me at the cancer center for discussion of these results and treatment options. The patient is in agreement with the current plan. Thank you so much for allowing me to participate in the care of Mr. Baumgart, I will continue to follow up the patient with you and assist in his management on as-needed basis.  Disclaimer: This note was dictated with voice recognition software. Similar sounding words can inadvertently be transcribed and may be missed upon review. Eilleen Kempf, MD

## 2018-07-22 NOTE — Procedures (Signed)
Interventional Radiology Procedure Note  Procedure: US guided biopsy of left kidney  Complications: Small perinephric hematoma  Estimated Blood Loss: < 62mL  Recommendations: - Bedrest x 4 hrs   Signed,  Criselda Peaches, MD

## 2018-07-22 NOTE — Sedation Documentation (Signed)
Pt sleeping at this time, biopsies taken

## 2018-07-23 ENCOUNTER — Inpatient Hospital Stay (HOSPITAL_COMMUNITY): Payer: BLUE CROSS/BLUE SHIELD

## 2018-07-23 DIAGNOSIS — I340 Nonrheumatic mitral (valve) insufficiency: Secondary | ICD-10-CM

## 2018-07-23 DIAGNOSIS — I5030 Unspecified diastolic (congestive) heart failure: Secondary | ICD-10-CM

## 2018-07-23 DIAGNOSIS — R779 Abnormality of plasma protein, unspecified: Secondary | ICD-10-CM

## 2018-07-23 LAB — BASIC METABOLIC PANEL
ANION GAP: 10 (ref 5–15)
BUN: 137 mg/dL — ABNORMAL HIGH (ref 6–20)
CO2: 21 mmol/L — ABNORMAL LOW (ref 22–32)
Calcium: 8.9 mg/dL (ref 8.9–10.3)
Chloride: 103 mmol/L (ref 98–111)
Creatinine, Ser: 6.25 mg/dL — ABNORMAL HIGH (ref 0.61–1.24)
GFR calc Af Amer: 10 mL/min — ABNORMAL LOW (ref >60)
GFR calc non Af Amer: 9 mL/min — ABNORMAL LOW (ref >60)
GLUCOSE: 258 mg/dL — AB (ref 70–99)
Potassium: 4.8 mmol/L (ref 3.5–5.1)
Sodium: 134 mmol/L — ABNORMAL LOW (ref 135–145)

## 2018-07-23 LAB — CBC
HCT: 20 % — ABNORMAL LOW (ref 39.0–52.0)
Hemoglobin: 6.4 g/dL — CL (ref 13.0–17.0)
MCH: 28.7 pg (ref 26.0–34.0)
MCHC: 32 g/dL (ref 30.0–36.0)
MCV: 89.7 fL (ref 80.0–100.0)
NRBC: 0 % (ref 0.0–0.2)
Platelets: 139 10*3/uL — ABNORMAL LOW (ref 150–400)
RBC: 2.23 MIL/uL — AB (ref 4.22–5.81)
RDW: 13.3 % (ref 11.5–15.5)
WBC: 6.5 10*3/uL (ref 4.0–10.5)

## 2018-07-23 LAB — BETA 2 MICROGLOBULIN, SERUM: Beta-2 Microglobulin: 7.4 mg/L — ABNORMAL HIGH (ref 0.6–2.4)

## 2018-07-23 LAB — PREPARE RBC (CROSSMATCH)

## 2018-07-23 LAB — ECHOCARDIOGRAM COMPLETE
Height: 73 in
Weight: 2240 oz

## 2018-07-23 LAB — HEMOGLOBIN AND HEMATOCRIT, BLOOD
HCT: 24.4 % — ABNORMAL LOW (ref 39.0–52.0)
Hemoglobin: 8.2 g/dL — ABNORMAL LOW (ref 13.0–17.0)

## 2018-07-23 MED ORDER — SODIUM CHLORIDE 0.9% IV SOLUTION
Freq: Once | INTRAVENOUS | Status: AC
  Administered 2018-07-23: 05:00:00 via INTRAVENOUS

## 2018-07-23 NOTE — Progress Notes (Signed)
PROGRESS NOTE    Jeremy HELT  Grant:[id number] DOB: 25-Dec-1960 DOA: 07/16/2018 PCP: Hayden Rasmussen, MD    Brief Narrative: 58 year old with past medical history significant for hypertension, ethanol abuse, nonischemic cardiomyopathy had a ejection fraction 20% with grade 3 diastolic dysfunction in 8119, thankfully normalized with normal repeated echo in 2017.  Patient presents with new onset anemia and renal failure.  He was seen by Dr. Delfino Lovett primary care doctor in the office who referred him for admission.  Patient hemoglobin was at 5 and a creatinine at 5. Patient admitted with renal failure and anemia.  Assessment & Plan:   Principal Problem:   Acute kidney failure (HCC) Active Problems:   Malignant hypertension   NICM (nonischemic cardiomyopathy) (HCC)   History of ETOH abuse   Chronic combined systolic and diastolic CHF (congestive heart failure) (HCC)   Normocytic normochromic anemia   Pancytopenia (HCC)   Multiple myeloma (HCC)   Estimated body mass index is 18.47 kg/m as calculated from the following:   Height as of this encounter: '6\' 1"'$  (1.854 m).   Weight as of this encounter: 63.5 kg.  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  Hydralazine has been discontinued. He received  IV Solu-Medrol per nephrology recommendation.  Patient to 60 mg of prednisone daily. He also has an spike on SPEP UPEP.  Skeletal bone survey negative.  Oncology  consulted. He underwent renal biopsy on 3/10. Biopsy results pending. Nephrology following. Nephrology recommending tunneled dialysis catheter placement.\  2-pancytopenia; Present with a hemoglobin at 4 on admission.  He received 3 units of packed red blood cell.  He has also received IV iron. SPEP UPEP with spike M. Oncology has been consulted.  They are recommending bone marrow biopsy.  3-acute blood loss anemia: After biopsy suspect hematoma  post biopsy. Pack of red blood cell order Need to repeat hemoglobin tonight.  Blood transfusion as needed.   History of nonischemic cardiomyopathy: Normalized by echo 2017.  Monitor.          DVT prophylaxis: SCDs Code Status: Full code Family Communication: Wife at bedside Disposition Plan: Remain in the hospital for blood transfusion and further evaluation of pancytopenia and renal failure  Consultants:   Nephrology  Oncology  IR  Procedures:  Renal biopsy 3/10   Antimicrobials:   None   Subjective: He is alert denies chest pain, per wife he has been DC.  Objective: Vitals:   07/22/18 1859 07/22/18 2143 07/23/18 0300 07/23/18 0815  BP: 140/69 137/78  137/63  Pulse: (!) 57 (!) 58  (!) 54  Resp:  18  18  Temp:  (!) 97.4 F (36.3 C)  97.8 F (36.6 C)  TempSrc:  Oral  Oral  SpO2: 100% 100%  99%  Weight:   63.5 kg   Height:   '6\' 1"'$  (1.854 m)     Intake/Output Summary (Last 24 hours) at 07/23/2018 0945 Last data filed at 07/23/2018 0601 Gross per 24 hour  Intake 137.48 ml  Output 1845 ml  Net -1707.52 ml   Filed Weights   07/19/18 2116 07/21/18 2122 07/23/18 0300  Weight: 64.4 kg 64.4 kg 63.5 kg    Examination:  General exam: Appears calm and comfortable  Respiratory system: Clear to auscultation. Respiratory effort normal. Cardiovascular system: S1 & S2 heard, RRR. No JVD, murmurs, rubs, gallops or clicks. No pedal edema. Gastrointestinal system: Abdomen is nondistended, soft and nontender. No organomegaly or masses felt. Normal  bowel sounds heard. Central nervous system: Alert and oriented. No focal neurological deficits. Extremities: Symmetric 5 x 5 power. Skin: No rashes, lesions or ulcers   Data Reviewed: I have personally reviewed following labs and imaging studies  CBC: Recent Labs  Lab 07/16/18 1836  07/18/18 0848 07/19/18 0455 07/21/18 0540 07/22/18 0420 07/23/18 0353  WBC 2.8*   < > 3.3* 3.6* 4.7 6.5 6.5  NEUTROABS 2.3  --   2.6 2.7  --   --   --   HGB 4.7*   < > 7.4* 7.8* 8.2* 8.3* 6.4*  HCT 15.2*   < > 22.8* 24.0* 24.6* 25.6* 20.0*  MCV 92.1   < > 88.4 88.6 88.2 88.3 89.7  PLT 124*   < > 112* 126* 128* 112* 139*   < > = values in this interval not displayed.   Basic Metabolic Panel: Recent Labs  Lab 07/18/18 0453 07/19/18 0455 07/20/18 0533 07/21/18 0540 07/22/18 0420 07/23/18 0353  NA 139 140 139 137 136 134*  K 4.0 4.3 4.3 4.4 4.4 4.8  CL 110 112* 110 106 103 103  CO2 17* 19* 19* 17* 19* 21*  GLUCOSE 101* 107* 99 205* 231* 258*  BUN 115* 114* 114* 123* 129* 137*  CREATININE 6.30* 6.04* 5.73* 6.32* 6.38* 6.25*  CALCIUM 9.0 8.7* 8.9 9.2 9.2 8.9  PHOS 6.3*  --   --   --   --   --    GFR: Estimated Creatinine Clearance: 11.6 mL/min (A) (by C-G formula based on SCr of 6.25 mg/dL (H)). Liver Function Tests: Recent Labs  Lab 07/16/18 1836 07/18/18 0453  AST 16  --   ALT 19  --   ALKPHOS 44  --   BILITOT 0.4  --   PROT 7.2  --   ALBUMIN 3.3* 3.0*   No results for input(s): LIPASE, AMYLASE in the last 168 hours. No results for input(s): AMMONIA in the last 168 hours. Coagulation Profile: Recent Labs  Lab 07/22/18 0420  INR 1.1   Cardiac Enzymes: No results for input(s): CKTOTAL, CKMB, CKMBINDEX, TROPONINI in the last 168 hours. BNP (last 3 results) No results for input(s): PROBNP in the last 8760 hours. HbA1C: No results for input(s): HGBA1C in the last 72 hours. CBG: No results for input(s): GLUCAP in the last 168 hours. Lipid Profile: No results for input(s): CHOL, HDL, LDLCALC, TRIG, CHOLHDL, LDLDIRECT in the last 72 hours. Thyroid Function Tests: No results for input(s): TSH, T4TOTAL, FREET4, T3FREE, THYROIDAB in the last 72 hours. Anemia Panel: No results for input(s): VITAMINB12, FOLATE, FERRITIN, TIBC, IRON, RETICCTPCT in the last 72 hours. Sepsis Labs: No results for input(s): PROCALCITON, LATICACIDVEN in the last 168 hours.  No results found for this or any previous  visit (from the past 240 hour(s)).       Radiology Studies: Dg Bone Survey Met  Result Date: 07/22/2018 CLINICAL DATA:  Multiple myeloma EXAM: METASTATIC BONE SURVEY COMPARISON:  None. FINDINGS: No focal bony lesions are identified. Bony mineralization appears normal. IMPRESSION: No focal bony lesions. Electronically Signed   By: Margarette Canada M.D.   On: 07/22/2018 20:43   US Biopsy (kidney)  Result Date: 07/22/2018 INDICATION: 58 year old male with acute kidney injury and hypertension. He presents for ultrasound-guided renal biopsy. EXAM: ULTRASOUND GUIDED RENAL BIOPSY COMPARISON:  None. MEDICATIONS: Fentanyl 75 mcg IV; Versed 2 mg IV ANESTHESIA/SEDATION: Total Moderate Sedation time Twelve minutes COMPLICATIONS: None immediate PROCEDURE: Informed written consent was obtained from the patient after a  discussion of the risks, benefits and alternatives to treatment. The patient understands and consents the procedure. A timeout was performed prior to the initiation of the procedure. Ultrasound scanning was performed of the bilateral flanks. The inferior pole of the left kidney was selected for biopsy due to location and sonographic window. The procedure was planned. The operative site was prepped and draped in the usual sterile fashion. The overlying soft tissues were anesthetized with 1% lidocaine with epinephrine. A 17 gauge core needle biopsy device was advanced into the inferior cortex of the left kidney and 2 core biopsies were obtained under direct ultrasound guidance. Images were saved for documentation purposes. The biopsy device was removed and hemostasis was obtained with manual compression. Post procedural scanning was negative for significant post procedural hemorrhage or additional complication. There is a small perinephric hematoma about the lower pole. A dressing was placed. The patient tolerated the procedure well without immediate post procedural complication. IMPRESSION: Technically  successful ultrasound guided left renal biopsy. Electronically Signed   By: Jacqulynn Cadet M.D.   On: 07/22/2018 18:07        Scheduled Meds:  sodium chloride   Intravenous Once   folic acid  1 mg Oral Daily   multivitamin with minerals  1 tablet Oral Daily   predniSONE  60 mg Oral Q breakfast   senna-docusate  1 tablet Oral BID   sodium bicarbonate  1,300 mg Oral BID   thiamine  100 mg Oral Daily   Or   thiamine  100 mg Intravenous Daily   Continuous Infusions:   LOS: 7 days    Time spent: 35 minutes.     Elmarie Shiley, MD [facility] Pager [phone]  If 7PM-7AM, please contact night-coverage [email or web address] Password [facility] 07/23/2018, 9:45 AM

## 2018-07-23 NOTE — Progress Notes (Signed)
Inpatient Diabetes Program Recommendations  AACE/ADA: New Consensus Statement on Inpatient Glycemic Control (2015)  Target Ranges:  Prepandial:   less than 140 mg/dL      Peak postprandial:   less than 180 mg/dL (1-2 hours)      Critically ill patients:  140 - 180 mg/dL   Results for [name], [name] (MRN [id number]) as of 07/23/2018 10:30  Ref. Range 07/21/2018 05:40 07/22/2018 04:20 07/23/2018 03:53  Glucose Latest Ref Range: 70 - 99 mg/dL 205 (H) 231 (H) 258 (H)   Review of Glycemic Control  Diabetes history: None   Inpatient Diabetes Program Recommendations:    PO prednisone 60 mg Daily Lab glucose in the 200's. Please consider CBGs and Novolog 0-9 units tid + Novolog 0-5 units qhs.  Thanks,  Tama Headings RN, MSN, BC-ADM Inpatient Diabetes Coordinator Team Pager [phone] (8a-5p)

## 2018-07-23 NOTE — Progress Notes (Signed)
IR aware of request for bone marrow biopsy and tunneled HD catheter placement - will tentatively plan for procedures tomorrow in IR pending any emergent cases.   Orders placed for NPO after midnight on 3/12, no heparin/lovenox until post procedure, AM labs ordered. PA will see tomorrow for full consult.  Please call IR with questions or concerns.  Candiss Norse, PA-C

## 2018-07-23 NOTE — Progress Notes (Signed)
[facility] ROUNDING NOTE   Subjective:   History this is a very pleasant gentleman with stage III chronic kidney disease 58 years of age history of alcohol use and congestive heart failure.  He was admitted with acute kidney injury and a nephritic appearing urine sediment.  His serology revealed an elevated MPO antibody of greater than 100 anti-histone antibody positive and his anti-GBM antibody was negative.  His kappa lambda ratio was also elevated.  Some of his other serologies were negative including an ANA hepatitis B C HIV and ASO.  He underwent successful renal biopsy 07/23/2018.  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  His hydralazine has been discontinued.  Baseline serum creatinine appears to be about 1.3.  Creatinine on admission was 6.23.  Blood pressure 136/63 pulse 54 temperature 97.8 O2 sats 99% room air  Sodium 134 potassium 4.8 chloride 103 CO2 21 BUN 137 creatinine 6.25 calcium 8.9 phosphorus 6.3 albumin 3.0 WBC 6.5 hemoglobin 6.4 platelets 139  Urine output 1.75 L.  Weight 64.4 kg  Prednisone 60 mg daily, sodium bicarbonate 1.3 g twice daily, Protonix 40 mg daily, folic acid 1 mg daily , multivitamins 1 daily  Chest x-ray showed suspected groundglass infiltrate left and right lung with mild cardiomegaly   Objective:  Vital signs in last 24 hours:  Temp:  [97.4 F (36.3 C)-98 F (36.7 C)] 97.8 F (36.6 C) (03/11 0815) Pulse Rate:  [54-68] 54 (03/11 0815) Resp:  [11-20] 18 (03/11 0815) BP: (137-197)/(63-147) 137/63 (03/11 0815) SpO2:  [99 %-100 %] 99 % (03/11 0815) Weight:  [63.5 kg] 63.5 kg (03/11 0300)  Weight change: -0.896 kg Filed Weights   07/19/18 2116 07/21/18 2122 07/23/18 0300  Weight: 64.4 kg 64.4 kg 63.5 kg    Intake/Output: I/O last 3 completed shifts: In: 137.5 [I.V.:5.7; IV Piggyback:131.8] Out: 1443 [Urine:3845]   Intake/Output this shift:  No  intake/output data recorded. Alert pleasant gentleman no obvious distress CVS- RRR murmurs rubs or gallops RS- CTA no wheezes or rales ABD- BS present soft non-distended EXT- no edema   Basic Metabolic Panel: Recent Labs  Lab 07/18/18 0453 07/19/18 0455 07/20/18 0533 07/21/18 0540 07/22/18 0420 07/23/18 0353  NA 139 140 139 137 136 134*  K 4.0 4.3 4.3 4.4 4.4 4.8  CL 110 112* 110 106 103 103  CO2 17* 19* 19* 17* 19* 21*  GLUCOSE 101* 107* 99 205* 231* 258*  BUN 115* 114* 114* 123* 129* 137*  CREATININE 6.30* 6.04* 5.73* 6.32* 6.38* 6.25*  CALCIUM 9.0 8.7* 8.9 9.2 9.2 8.9  PHOS 6.3*  --   --   --   --   --     Liver Function Tests: Recent Labs  Lab 07/16/18 1836 07/18/18 0453  AST 16  --   ALT 19  --   ALKPHOS 44  --   BILITOT 0.4  --   PROT 7.2  --   ALBUMIN 3.3* 3.0*   No results for input(s): LIPASE, AMYLASE in the last 168 hours. No results for input(s): AMMONIA in the last 168 hours.  CBC: Recent Labs  Lab 07/16/18 1836  07/18/18 0848 07/19/18 0455 07/21/18 0540 07/22/18 0420 07/23/18 0353  WBC 2.8*   < > 3.3* 3.6* 4.7 6.5 6.5  NEUTROABS 2.3  --  2.6 2.7  --   --   --   HGB 4.7*   < > 7.4* 7.8* 8.2* 8.3* 6.4*  HCT 15.2*   < > 22.8* 24.0* 24.6* 25.6* 20.0*  MCV 92.1   < > 88.4 88.6 88.2 88.3 89.7  PLT 124*   < > 112* 126* 128* 112* 139*   < > = values in this interval not displayed.    Cardiac Enzymes: No results for input(s): CKTOTAL, CKMB, CKMBINDEX, TROPONINI in the last 168 hours.  BNP: Invalid input(s): POCBNP  CBG: No results for input(s): GLUCAP in the last 168 hours.  Microbiology: Results for orders placed or performed during the hospital encounter of 11/02/13  MRSA PCR Screening     Status: None   Collection Time: 11/02/13 10:31 PM  Result Value Ref Range Status   MRSA by PCR NEGATIVE NEGATIVE Final    Comment:        The GeneXpert MRSA Assay (FDA approved for NASAL specimens only), is one component of a comprehensive MRSA  colonization surveillance program. It is not intended to diagnose MRSA infection nor to guide or monitor treatment for MRSA infections.    Coagulation Studies: Recent Labs    07/22/18 0420  LABPROT 14.5  INR 1.1    Urinalysis: No results for input(s): COLORURINE, LABSPEC, PHURINE, GLUCOSEU, HGBUR, BILIRUBINUR, KETONESUR, PROTEINUR, UROBILINOGEN, NITRITE, LEUKOCYTESUR in the last 72 hours.  Invalid input(s): APPERANCEUR    Imaging: Dg Bone Survey Met  Result Date: 07/22/2018 CLINICAL DATA:  Multiple myeloma EXAM: METASTATIC BONE SURVEY COMPARISON:  None. FINDINGS: No focal bony lesions are identified. Bony mineralization appears normal. IMPRESSION: No focal bony lesions. Electronically Signed   By: Margarette Canada M.D.   On: 07/22/2018 20:43   US Biopsy (kidney)  Result Date: 07/22/2018 INDICATION: 58 year old male with acute kidney injury and hypertension. He presents for ultrasound-guided renal biopsy. EXAM: ULTRASOUND GUIDED RENAL BIOPSY COMPARISON:  None. MEDICATIONS: Fentanyl 75 mcg IV; Versed 2 mg IV ANESTHESIA/SEDATION: Total Moderate Sedation time Twelve minutes COMPLICATIONS: None immediate PROCEDURE: Informed written consent was obtained from the patient after a discussion of the risks, benefits and alternatives to treatment. The patient understands and consents the procedure. A timeout was performed prior to the initiation of the procedure. Ultrasound scanning was performed of the bilateral flanks. The inferior pole of the left kidney was selected for biopsy due to location and sonographic window. The procedure was planned. The operative site was prepped and draped in the usual sterile fashion. The overlying soft tissues were anesthetized with 1% lidocaine with epinephrine. A 17 gauge core needle biopsy device was advanced into the inferior cortex of the left kidney and 2 core biopsies were obtained under direct ultrasound guidance. Images were saved for documentation purposes. The  biopsy device was removed and hemostasis was obtained with manual compression. Post procedural scanning was negative for significant post procedural hemorrhage or additional complication. There is a small perinephric hematoma about the lower pole. A dressing was placed. The patient tolerated the procedure well without immediate post procedural complication. IMPRESSION: Technically successful ultrasound guided left renal biopsy. Electronically Signed   By: Jacqulynn Cadet M.D.   On: 07/22/2018 18:07     Medications:    . sodium chloride   Intravenous Once  . folic acid  1 mg Oral Daily  . multivitamin with minerals  1 tablet Oral Daily  . predniSONE  60 mg Oral Q breakfast  . senna-docusate  1 tablet Oral BID  . sodium bicarbonate  1,300 mg Oral BID  . thiamine  100 mg Oral Daily   Or  . 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  IV Solu-Medrol administered 1 g daily x3 doses has transitioned to oral prednisone 07/23/2018 we will start 60 mg daily..  Will await renal biopsy before initiating cytotoxic agents. He does have positive M spike.  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  Will order bone survey.  Renal function does not appear to be improving will start dialysis will need tunneled dialysis catheter.  There is no absolute urgency today however this should be placed by interventional radiology tomorrow 07/24/2018 his creatinine appears to be increasing as this is BUN.  He is asymptomatic with no uremic signs and symptoms at this point  Nonischemic cardiomyopathy last 2D echo was 2017 normal EF.  Recommend repeat 2D echo.  Pancytopenia fixation positive M spike urine .  Hemoglobin 4.7 on admission 3 units packed red blood cells transfused.  Hemoglobin has dropped status post biopsy no blood noted in  urine.  He will be transfused 1 unit packed red blood cells 9/53/2023  Metabolic acidosis continue sodium bicarbonate  Hypertension/volume appears euvolemic   LOS: 7 Sherril Croon '@TODAY''@9'$ :55 AM

## 2018-07-23 NOTE — Progress Notes (Signed)
  Echocardiogram 2D Echocardiogram has been performed.  Jannett Celestine 07/23/2018, 4:50 PM

## 2018-07-24 ENCOUNTER — Other Ambulatory Visit: Payer: Self-pay

## 2018-07-24 ENCOUNTER — Inpatient Hospital Stay (HOSPITAL_COMMUNITY): Payer: BLUE CROSS/BLUE SHIELD

## 2018-07-24 LAB — TYPE AND SCREEN
ABO/RH(D): O NEG
Antibody Screen: NEGATIVE
Unit division: 0
Unit division: 0

## 2018-07-24 LAB — CBC WITH DIFFERENTIAL/PLATELET
Abs Immature Granulocytes: 0.06 10*3/uL (ref 0.00–0.07)
Basophils Absolute: 0 10*3/uL (ref 0.0–0.1)
Basophils Relative: 0 %
EOS PCT: 0 %
Eosinophils Absolute: 0 10*3/uL (ref 0.0–0.5)
HCT: 22.1 % — ABNORMAL LOW (ref 39.0–52.0)
Hemoglobin: 7.6 g/dL — ABNORMAL LOW (ref 13.0–17.0)
Immature Granulocytes: 1 %
Lymphocytes Relative: 11 %
Lymphs Abs: 0.6 10*3/uL — ABNORMAL LOW (ref 0.7–4.0)
MCH: 29.8 pg (ref 26.0–34.0)
MCHC: 34.4 g/dL (ref 30.0–36.0)
MCV: 86.7 fL (ref 80.0–100.0)
MONO ABS: 0.5 10*3/uL (ref 0.1–1.0)
Monocytes Relative: 9 %
Neutro Abs: 4.6 10*3/uL (ref 1.7–7.7)
Neutrophils Relative %: 79 %
Platelets: 126 10*3/uL — ABNORMAL LOW (ref 150–400)
RBC: 2.55 MIL/uL — ABNORMAL LOW (ref 4.22–5.81)
RDW: 14 % (ref 11.5–15.5)
WBC: 5.8 10*3/uL (ref 4.0–10.5)
nRBC: 0 % (ref 0.0–0.2)

## 2018-07-24 LAB — BASIC METABOLIC PANEL
Anion gap: 12 (ref 5–15)
BUN: 131 mg/dL — AB (ref 6–20)
CO2: 22 mmol/L (ref 22–32)
Calcium: 9.1 mg/dL (ref 8.9–10.3)
Chloride: 107 mmol/L (ref 98–111)
Creatinine, Ser: 6.12 mg/dL — ABNORMAL HIGH (ref 0.61–1.24)
GFR calc Af Amer: 11 mL/min — ABNORMAL LOW (ref >60)
GFR calc non Af Amer: 9 mL/min — ABNORMAL LOW (ref >60)
GLUCOSE: 127 mg/dL — AB (ref 70–99)
Potassium: 4.2 mmol/L (ref 3.5–5.1)
Sodium: 141 mmol/L (ref 135–145)

## 2018-07-24 LAB — BPAM RBC
Blood Product Expiration Date: 202003242359
Blood Product Expiration Date: 202003272359
ISSUE DATE / TIME: 202003111005
ISSUE DATE / TIME: 202003111435
Unit Type and Rh: 9500
Unit Type and Rh: 9500

## 2018-07-24 LAB — HEMOGLOBIN AND HEMATOCRIT, BLOOD
HCT: 24.6 % — ABNORMAL LOW (ref 39.0–52.0)
Hemoglobin: 8.2 g/dL — ABNORMAL LOW (ref 13.0–17.0)

## 2018-07-24 LAB — PROTIME-INR
INR: 1.3 — ABNORMAL HIGH (ref 0.8–1.2)
Prothrombin Time: 15.8 seconds — ABNORMAL HIGH (ref 11.4–15.2)

## 2018-07-24 MED ORDER — LIDOCAINE HCL 1 % IJ SOLN
INTRAMUSCULAR | Status: AC
  Filled 2018-07-24: qty 20

## 2018-07-24 MED ORDER — AMLODIPINE BESYLATE 10 MG PO TABS
10.0000 mg | ORAL_TABLET | Freq: Every day | ORAL | Status: DC
  Administered 2018-07-24 – 2018-08-04 (×12): 10 mg via ORAL
  Filled 2018-07-24 (×12): qty 1

## 2018-07-24 MED ORDER — FENTANYL CITRATE (PF) 100 MCG/2ML IJ SOLN
INTRAMUSCULAR | Status: AC
  Filled 2018-07-24: qty 2

## 2018-07-24 MED ORDER — MIDAZOLAM HCL 2 MG/2ML IJ SOLN
INTRAMUSCULAR | Status: AC
  Filled 2018-07-24: qty 2

## 2018-07-24 MED ORDER — FAMOTIDINE 20 MG PO TABS
20.0000 mg | ORAL_TABLET | Freq: Every day | ORAL | Status: DC
  Administered 2018-07-24 – 2018-08-04 (×12): 20 mg via ORAL
  Filled 2018-07-24 (×12): qty 1

## 2018-07-24 NOTE — Progress Notes (Signed)
Referring Physician(s): Dr. Edrick Oh  Supervising Physician: Corrie Mckusick  Patient Status:  [facility] - In-pt  Chief Complaint: Acute kidney injury  HPI: Patient known to IR from recent renal biopsy.  He continues with acute kidney injury with worsening of his SCr and BUN.  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  Request now made for tunneled HD catheter placement as well as bone marrow biopsy.   Allergies: Hydralazine hcl  Medications: Prior to Admission medications   Medication Sig Start Date End Date Taking? Authorizing Provider  amLODipine (NORVASC) 10 MG tablet Take 1 tablet (10 mg total) by mouth daily. FINAL WARNING-NO FURTHER REFILLS UNTIL SEEN IN [facility]F CLINIC. Patient taking differently: Take 10 mg by mouth daily.  01/15/17  Yes Larey Dresser, MD  carvedilol (COREG) 25 MG tablet Take 1 tablet (25 mg total) by mouth 2 (two) times daily with a meal. FINAL WARNING-NO FURTHER REFILLS UNTIL SEEN IN CHF CLINIC. Patient taking differently: Take 25 mg by mouth 2 (two) times daily with a meal.  01/15/17  Yes Larey Dresser, MD  hydrALAZINE (APRESOLINE) 100 MG tablet Take 1 tablet (100 mg total) by mouth 3 (three) times daily. FINAL WARNING-NO FURTHER REFILLS UNTIL SEEN IN CHF CLINIC. Patient taking differently: Take 100 mg by mouth 3 (three) times daily.  01/15/17  Yes Larey Dresser, MD  isosorbide mononitrate (IMDUR) 30 MG 24 hr tablet Take 1 tablet (30 mg total) by mouth 2 (two) times daily. FINAL WARNING-NO FURTHER REFILLS UNTIL SEEN IN CHF CLINIC. Patient taking differently: Take 30 mg by mouth 2 (two) times daily.  01/15/17  Yes Larey Dresser, MD  furosemide (LASIX) 20 MG tablet Take for weight gain 3lbs day or 5 lbs in a week.FINAL WARNING-NO FURTHER REFILLS UNTIL SEEN IN CHF CLINIC. Patient not taking: Reported on 07/16/2018 01/15/17   Larey Dresser, MD     Vital Signs: BP (!) 173/83 (BP Location: Left Arm)    Pulse (!) 54    Temp (!) 97.4 F  (36.3 C) (Oral)    Resp 17    Ht _0  (1.854 m)    Wt 140 lb 3.4 oz (63.6 kg)    SpO2 99%    BMI 18.50 kg/m   Physical Exam Vitals signs and nursing note reviewed.  Constitutional:      Appearance: Normal appearance.  Neck:     Musculoskeletal: Normal range of motion and neck supple.  Cardiovascular:     Rate and Rhythm: Normal rate and regular rhythm.     Pulses: Normal pulses.     Heart sounds: No murmur. No friction rub. No gallop.   Pulmonary:     Effort: Pulmonary effort is normal.     Breath sounds: Normal breath sounds.  Skin:    General: Skin is warm and dry.  Neurological:     General: No focal deficit present.     Mental Status: He is alert and oriented to person, place, and time. Mental status is at baseline.  Psychiatric:        Mood and Affect: Mood normal.        Behavior: Behavior normal.        Thought Content: Thought content normal.        Judgment: Judgment normal.      Imaging: Dg Bone Survey Met  Result Date: 07/22/2018 CLINICAL DATA:  Multiple myeloma EXAM: METASTATIC BONE SURVEY COMPARISON:  None. FINDINGS: No focal bony lesions  are identified. Bony mineralization appears normal. IMPRESSION: No focal bony lesions. Electronically Signed   By: Margarette Canada M.D.   On: 07/22/2018 20:43   US Biopsy (kidney)  Result Date: 07/22/2018 INDICATION: 58 year old male with acute kidney injury and hypertension. He presents for ultrasound-guided renal biopsy. EXAM: ULTRASOUND GUIDED RENAL BIOPSY COMPARISON:  None. MEDICATIONS: Fentanyl 75 mcg IV; Versed 2 mg IV ANESTHESIA/SEDATION: Total Moderate Sedation time Twelve minutes COMPLICATIONS: None immediate PROCEDURE: Informed written consent was obtained from the patient after a discussion of the risks, benefits and alternatives to treatment. The patient understands and consents the procedure. A timeout was performed prior to the initiation of the procedure. Ultrasound scanning was performed of the bilateral flanks. The  inferior pole of the left kidney was selected for biopsy due to location and sonographic window. The procedure was planned. The operative site was prepped and draped in the usual sterile fashion. The overlying soft tissues were anesthetized with 1% lidocaine with epinephrine. A 17 gauge core needle biopsy device was advanced into the inferior cortex of the left kidney and 2 core biopsies were obtained under direct ultrasound guidance. Images were saved for documentation purposes. The biopsy device was removed and hemostasis was obtained with manual compression. Post procedural scanning was negative for significant post procedural hemorrhage or additional complication. There is a small perinephric hematoma about the lower pole. A dressing was placed. The patient tolerated the procedure well without immediate post procedural complication. IMPRESSION: Technically successful ultrasound guided left renal biopsy. Electronically Signed   By: Jacqulynn Cadet M.D.   On: 07/22/2018 18:07    Labs:  CBC: Recent Labs    07/21/18 0540 07/22/18 0420 07/23/18 0353 07/23/18 1807 07/24/18 0432  WBC 4.7 6.5 6.5  --  5.8  HGB 8.2* 8.3* 6.4* 8.2* 7.6*  HCT 24.6* 25.6* 20.0* 24.4* 22.1*  PLT 128* 112* 139*  --  126*    COAGS: Recent Labs    07/22/18 0420 07/24/18 0432  INR 1.1 1.3*    BMP: Recent Labs    07/21/18 0540 07/22/18 0420 07/23/18 0353 07/24/18 0432  NA 137 136 134* 141  K 4.4 4.4 4.8 4.2  CL 106 103 103 107  CO2 17* 19* 21* 22  GLUCOSE 205* 231* 258* 127*  BUN 123* 129* 137* 131*  CALCIUM 9.2 9.2 8.9 9.1  CREATININE 6.32* 6.38* 6.25* 6.12*  GFRNONAA 9* 9* 9* 9*  GFRAA 10* 10* 10* 11*    LIVER FUNCTION TESTS: Recent Labs    07/16/18 1836 07/18/18 0453  BILITOT 0.4  --   AST 16  --   ALT 19  --   ALKPHOS 44  --   PROT 7.2  --   ALBUMIN 3.3* 3.0*    Assessment and Plan: Pancytopenia Patient with pancytopenia and elevated M spike.  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    Request for bone marrow biopsy by Dr. Earlie Server.  Patient has been NPO.   Risks and benefits of biopsy was discussed with the patient and/or patient's family including, but not limited to bleeding, infection, damage to adjacent structures or low yield requiring additional tests.  All of the questions were answered and there is agreement to proceed.  Consent signed and in chart.  Proceed with bone marrow biopsy today in CT.   Acute kidney injury Patient with worsening Cr and BUN.  Will likely need dialysis.   Request made for tunneled dialysis catheter placement.  Spoke with Dr. Justin Mend.  HD catheter placement today or  tomorrow.  NPO. Not on blood thinners. INR 1.3  Risks and benefits discussed with the patient including, but not limited to bleeding, infection, vascular injury, pneumothorax which may require chest tube placement, air embolism or even death  All of the patient's questions were answered, patient is agreeable to proceed. Consent signed and in chart.  Proceed as schedule allows today or tomorrow.   Electronically Signed: Docia Barrier, PA 07/24/2018, 10:05 AM   I spent a total of 15 Minutes at the the patient's bedside AND on the patient's hospital floor or unit, greater than 50% of which was counseling/coordinating care for pancytopenia, acute kidney injury

## 2018-07-24 NOTE — Progress Notes (Signed)
PROGRESS NOTE    Jeremy Grant  AVW:[id number] DOB: 03-Jan-1961 DOA: 07/16/2018 PCP: Hayden Rasmussen, MD    Brief Narrative: 58 year old with past medical history significant for hypertension, ethanol abuse, nonischemic cardiomyopathy had a ejection fraction 20% with grade 3 diastolic dysfunction in 8295, thankfully normalized with normal repeated echo in 2017.  Patient presents with new onset anemia and renal failure.  He was seen by Dr. Delfino Lovett primary care doctor in the office who referred him for admission.  Patient hemoglobin was at 5 and a creatinine at 5. Patient admitted with renal failure and anemia.  Assessment & Plan:   Principal Problem:   Acute kidney failure (HCC) Active Problems:   Malignant hypertension   NICM (nonischemic cardiomyopathy) (HCC)   History of ETOH abuse   Chronic combined systolic and diastolic CHF (congestive heart failure) (HCC)   Normocytic normochromic anemia   Pancytopenia (HCC)   Multiple myeloma (HCC)   Elevated serum protein level   Estimated body mass index is 18.5 kg/m as calculated from the following:   Height as of this encounter: '6\' 1"'$  (1.854 m).   Weight as of this encounter: 63.6 kg.  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  Hydralazine has been discontinued. He received  IV Solu-Medrol per nephrology recommendation.  Patient to 60 mg of prednisone daily. He also has an spike on SPEP UPEP.  Skeletal bone survey negative.  Oncology  consulted. He underwent renal biopsy on 3/10. Biopsy results pending. Nephrology following. Nephrology recommending tunneled dialysis catheter placement.\  2-Pancytopenia; Present with a hemoglobin at 4 on admission.  He received 3 units of packed red blood cell.  He has also received IV iron. SPEP UPEP with spike M. Oncology has been consulted.  Patient underwent Bone Marrow biopsy 3-12.  3-Acute blood loss  anemia: After biopsy suspect hematoma post biopsy. Received 2 units PRBC 3-11. Hb at 7.6 this am. Repeat this afternoon if dropping he will need another unit PRBC>    History of nonischemic cardiomyopathy: Normalized by echo 2017.  Monitor. Repeated ECHO with normal EF 3-12.   HTN; resume Norvasc.       DVT prophylaxis: SCDs Code Status: Full code Family Communication: Wife at bedside Disposition Plan: Remain in the hospital for blood transfusion and further evaluation of pancytopenia and renal failure  Consultants:   Nephrology  Oncology  IR  Procedures:  Renal biopsy 3/10   Antimicrobials:   None   Subjective: Came from BM biopsy. Denies pain. Denies dyspnea   Objective: Vitals:   07/24/18 1102 07/24/18 1130 07/24/18 1200 07/24/18 1237  BP: (!) 194/83 (!) 194/83 (!) 188/81 (!) 194/83  Pulse: (!) 57     Resp: 16     Temp:      TempSrc:      SpO2: 100%     Weight:      Height:        Intake/Output Summary (Last 24 hours) at 07/24/2018 1341 Last data filed at 07/24/2018 0900 Gross per 24 hour  Intake 575 ml  Output 2700 ml  Net -2125 ml   Filed Weights   07/21/18 2122 07/23/18 0300 07/23/18 2110  Weight: 64.4 kg 63.5 kg 63.6 kg    Examination:  General exam: Appears calm and comfortable Respiratory system: Clear to auscultation normal respiratory effort Cardiovascular system: S1, S2 regular rhythm and rate Gastrointestinal system: Bowel sounds present soft nontender nondistended Central nervous system: Alert and oriented nonfocal Extremities:  Symmetric power  Data Reviewed: I have personally reviewed following labs and imaging studies  CBC: Recent Labs  Lab 07/18/18 0848 07/19/18 0455 07/21/18 0540 07/22/18 0420 07/23/18 0353 07/23/18 1807 07/24/18 0432  WBC 3.3* 3.6* 4.7 6.5 6.5  --  5.8  NEUTROABS 2.6 2.7  --   --   --   --  4.6  HGB 7.4* 7.8* 8.2* 8.3* 6.4* 8.2* 7.6*  HCT 22.8* 24.0* 24.6* 25.6* 20.0* 24.4* 22.1*  MCV 88.4 88.6  88.2 88.3 89.7  --  86.7  PLT 112* 126* 128* 112* 139*  --  932*   Basic Metabolic Panel: Recent Labs  Lab 07/18/18 0453  07/20/18 0533 07/21/18 0540 07/22/18 0420 07/23/18 0353 07/24/18 0432  NA 139   < > 139 137 136 134* 141  K 4.0   < > 4.3 4.4 4.4 4.8 4.2  CL 110   < > 110 106 103 103 107  CO2 17*   < > 19* 17* 19* 21* 22  GLUCOSE 101*   < > 99 205* 231* 258* 127*  BUN 115*   < > 114* 123* 129* 137* 131*  CREATININE 6.30*   < > 5.73* 6.32* 6.38* 6.25* 6.12*  CALCIUM 9.0   < > 8.9 9.2 9.2 8.9 9.1  PHOS 6.3*  --   --   --   --   --   --    < > = values in this interval not displayed.   GFR: Estimated Creatinine Clearance: 11.8 mL/min (A) (by C-G formula based on SCr of 6.12 mg/dL (H)). Liver Function Tests: Recent Labs  Lab 07/18/18 0453  ALBUMIN 3.0*   No results for input(s): LIPASE, AMYLASE in the last 168 hours. No results for input(s): AMMONIA in the last 168 hours. Coagulation Profile: Recent Labs  Lab 07/22/18 0420 07/24/18 0432  INR 1.1 1.3*   Cardiac Enzymes: No results for input(s): CKTOTAL, CKMB, CKMBINDEX, TROPONINI in the last 168 hours. BNP (last 3 results) No results for input(s): PROBNP in the last 8760 hours. HbA1C: No results for input(s): HGBA1C in the last 72 hours. CBG: No results for input(s): GLUCAP in the last 168 hours. Lipid Profile: No results for input(s): CHOL, HDL, LDLCALC, TRIG, CHOLHDL, LDLDIRECT in the last 72 hours. Thyroid Function Tests: No results for input(s): TSH, T4TOTAL, FREET4, T3FREE, THYROIDAB in the last 72 hours. Anemia Panel: No results for input(s): VITAMINB12, FOLATE, FERRITIN, TIBC, IRON, RETICCTPCT in the last 72 hours. Sepsis Labs: No results for input(s): PROCALCITON, LATICACIDVEN in the last 168 hours.  No results found for this or any previous visit (from the past 240 hour(s)).       Radiology Studies: Dg Bone Survey Met  Result Date: 07/22/2018 CLINICAL DATA:  Multiple myeloma EXAM: METASTATIC  BONE SURVEY COMPARISON:  None. FINDINGS: No focal bony lesions are identified. Bony mineralization appears normal. IMPRESSION: No focal bony lesions. Electronically Signed   By: Margarette Canada M.D.   On: 07/22/2018 20:43   Ct Bone Marrow Biopsy & Aspiration  Result Date: 07/24/2018 INDICATION: 58 year old male with a history of multiple myeloma EXAM: CT BONE MARROW BIOPSY AND ASPIRATION MEDICATIONS: None. ANESTHESIA/SEDATION: Moderate (conscious) sedation was employed during this procedure. A total of Versed 1.5 mg and Fentanyl 75 mcg was administered intravenously. Moderate Sedation Time: 13 minutes. The patient's level of consciousness and vital signs were monitored continuously by radiology nursing throughout the procedure under my direct supervision. FLUOROSCOPY TIME:  CT COMPLICATIONS: None PROCEDURE: The procedure risks,  benefits, and alternatives were explained to the patient. Questions regarding the procedure were encouraged and answered. The patient understands and consents to the procedure. Scout CT of the pelvis was performed for surgical planning purposes. The posterior pelvis was prepped with Chlorhexidine in a sterile fashion, and a sterile drape was applied covering the operative field. A sterile gown and sterile gloves were used for the procedure. Local anesthesia was provided with 1% Lidocaine. Posterior iliac bone was targeted for biopsy. The skin and subcutaneous tissues were infiltrated with 1% lidocaine without epinephrine. A small stab incision was made with an 11 blade scalpel, and an 11 gauge Murphy needle was advanced with CT guidance to the posterior cortex. Manual forced was used to advance the needle through the posterior cortex and the stylet was removed. A bone marrow aspirate was retrieved and passed to a cytotechnologist in the room. The Murphy needle was then advanced without the stylet for a core biopsy. The core biopsy was retrieved and also passed to a cytotechnologist. Manual  pressure was used for hemostasis and a sterile dressing was placed. No complications were encountered no significant blood loss was encountered. Patient tolerated the procedure well and remained hemodynamically stable throughout. IMPRESSION: Status post CT-guided bone marrow biopsy, with tissue specimen sent to pathology for complete histopathologic analysis Signed, Dulcy Fanny. Earleen Newport, DO [facility] Radiology Electronically Signed   By: Corrie Mckusick D.O.   On: 07/24/2018 12:02   US Biopsy (kidney)  Result Date: 07/22/2018 INDICATION: 58 year old male with acute kidney injury and hypertension. He presents for ultrasound-guided renal biopsy. EXAM: ULTRASOUND GUIDED RENAL BIOPSY COMPARISON:  None. MEDICATIONS: Fentanyl 75 mcg IV; Versed 2 mg IV ANESTHESIA/SEDATION: Total Moderate Sedation time Twelve minutes COMPLICATIONS: None immediate PROCEDURE: Informed written consent was obtained from the patient after a discussion of the risks, benefits and alternatives to treatment. The patient understands and consents the procedure. A timeout was performed prior to the initiation of the procedure. Ultrasound scanning was performed of the bilateral flanks. The inferior pole of the left kidney was selected for biopsy due to location and sonographic window. The procedure was planned. The operative site was prepped and draped in the usual sterile fashion. The overlying soft tissues were anesthetized with 1% lidocaine with epinephrine. A 17 gauge core needle biopsy device was advanced into the inferior cortex of the left kidney and 2 core biopsies were obtained under direct ultrasound guidance. Images were saved for documentation purposes. The biopsy device was removed and hemostasis was obtained with manual compression. Post procedural scanning was negative for significant post procedural hemorrhage or additional complication. There is a small perinephric hematoma about the lower  pole. A dressing was placed. The patient tolerated the procedure well without immediate post procedural complication. IMPRESSION: Technically successful ultrasound guided left renal biopsy. Electronically Signed   By: Jacqulynn Cadet M.D.   On: 07/22/2018 18:07        Scheduled Meds:  sodium chloride   Intravenous Once   amLODipine  10 mg Oral Daily   famotidine  20 mg Oral Daily   fentaNYL       folic acid  1 mg Oral Daily   lidocaine       midazolam       multivitamin with minerals  1 tablet Oral Daily   predniSONE  60 mg Oral Q breakfast   senna-docusate  1 tablet Oral BID   sodium bicarbonate  1,300 mg Oral BID   thiamine  100  mg Oral Daily   Or   thiamine  100 mg Intravenous Daily   Continuous Infusions:   LOS: 8 days    Time spent: 35 minutes.     Elmarie Shiley, MD [facility] Pager [phone]  If 7PM-7AM, please contact night-coverage [email or web address] Password [facility] 07/24/2018, 1:41 PM

## 2018-07-24 NOTE — Procedures (Signed)
Interventional Radiology Procedure Note ? ?Procedure: CT guided aspirate and core biopsy of right iliac bone ?Complications: None ?Recommendations: ?- Bedrest supine x 1 hrs ?- OTC's PRN  Pain ?- Follow biopsy results ? ?Signed, ? ?Andron Marrazzo S. Roselie Cirigliano, DO ? ? ?

## 2018-07-24 NOTE — Progress Notes (Signed)
[facility] ROUNDING NOTE   Subjective:   History this is a very pleasant gentleman with stage III chronic kidney disease 58 years of age history of alcohol use and congestive heart failure.  He was admitted with acute kidney injury and a nephritic appearing urine sediment.  His serology revealed an elevated MPO antibody of greater than 100 anti-histone antibody positive and his anti-GBM antibody was negative.  His kappa lambda ratio was also elevated.  Some of his other serologies were negative including an ANA hepatitis B C HIV and ASO.  He underwent successful renal biopsy 07/23/2018.  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  His hydralazine has been discontinued.  Baseline serum creatinine appears to be about 1.3.  Creatinine on admission was 6.23.  Blood pressure 190/83 pulse 57 temperature 98.  Sodium 141 potassium 4.2 chloride 107 CO2 22 BUN 131 creatinine 6.12 glucose 127 calcium 9.1 WBC 5.8 hemoglobin 7.6 platelets 126  Urine output 2575 cc 07/23/2018 weight 63.6  Prednisone 60 mg daily, sodium bicarbonate 1.3 g twice daily, Protonix 40 mg daily, folic acid 1 mg daily , multivitamins 1 daily  Chest x-ray showed suspected groundglass infiltrate left and right lung with mild cardiomegaly   Objective:  Vital signs in last 24 hours:  Temp:  [97.4 F (36.3 C)-98.1 F (36.7 C)] 98 F (36.7 C) (03/12 1025) Pulse Rate:  [54-61] 57 (03/12 1102) Resp:  [14-19] 16 (03/12 1102) BP: (147-194)/(69-83) 194/83 (03/12 1102) SpO2:  [98 %-100 %] 100 % (03/12 1102) Weight:  [63.6 kg] 63.6 kg (03/11 2110)  Weight change: 0.096 kg Filed Weights   07/21/18 2122 07/23/18 0300 07/23/18 2110  Weight: 64.4 kg 63.5 kg 63.6 kg    Intake/Output: I/O last 3 completed shifts: In: 820.7 [P.O.:240; I.V.:265.7; Blood:315] Out: 7902 [Urine:4050]   Intake/Output this shift:  Total I/O In: 0  Out: 350  [Urine:350] Alert pleasant gentleman no obvious distress CVS- RRR murmurs rubs or gallops RS- CTA no wheezes or rales ABD- BS present soft non-distended EXT- no edema   Basic Metabolic Panel: Recent Labs  Lab 07/18/18 0453  07/20/18 0533 07/21/18 0540 07/22/18 0420 07/23/18 0353 07/24/18 0432  NA 139   < > 139 137 136 134* 141  K 4.0   < > 4.3 4.4 4.4 4.8 4.2  CL 110   < > 110 106 103 103 107  CO2 17*   < > 19* 17* 19* 21* 22  GLUCOSE 101*   < > 99 205* 231* 258* 127*  BUN 115*   < > 114* 123* 129* 137* 131*  CREATININE 6.30*   < > 5.73* 6.32* 6.38* 6.25* 6.12*  CALCIUM 9.0   < > 8.9 9.2 9.2 8.9 9.1  PHOS 6.3*  --   --   --   --   --   --    < > = values in this interval not displayed.    Liver Function Tests: Recent Labs  Lab 07/18/18 0453  ALBUMIN 3.0*   No results for input(s): LIPASE, AMYLASE in the last 168 hours. No results for input(s): AMMONIA in the last 168 hours.  CBC: Recent Labs  Lab 07/18/18 0848 07/19/18 0455 07/21/18 0540 07/22/18 0420 07/23/18 0353 07/23/18 1807 07/24/18 0432  WBC 3.3* 3.6* 4.7 6.5 6.5  --  5.8  NEUTROABS 2.6 2.7  --   --   --   --  4.6  HGB  7.4* 7.8* 8.2* 8.3* 6.4* 8.2* 7.6*  HCT 22.8* 24.0* 24.6* 25.6* 20.0* 24.4* 22.1*  MCV 88.4 88.6 88.2 88.3 89.7  --  86.7  PLT 112* 126* 128* 112* 139*  --  126*    Cardiac Enzymes: No results for input(s): CKTOTAL, CKMB, CKMBINDEX, TROPONINI in the last 168 hours.  BNP: Invalid input(s): POCBNP  CBG: No results for input(s): GLUCAP in the last 168 hours.  Microbiology: Results for orders placed or performed during the hospital encounter of 11/02/13  MRSA PCR Screening     Status: None   Collection Time: 11/02/13 10:31 PM  Result Value Ref Range Status   MRSA by PCR NEGATIVE NEGATIVE Final    Comment:        The GeneXpert MRSA Assay (FDA approved for NASAL specimens only), is one component of a comprehensive MRSA colonization surveillance program. It is not intended to  diagnose MRSA infection nor to guide or monitor treatment for MRSA infections.    Coagulation Studies: Recent Labs    07/22/18 0420 07/24/18 0432  LABPROT 14.5 15.8*  INR 1.1 1.3*    Urinalysis: No results for input(s): COLORURINE, LABSPEC, PHURINE, GLUCOSEU, HGBUR, BILIRUBINUR, KETONESUR, PROTEINUR, UROBILINOGEN, NITRITE, LEUKOCYTESUR in the last 72 hours.  Invalid input(s): APPERANCEUR    Imaging: Dg Bone Survey Met  Result Date: 07/22/2018 CLINICAL DATA:  Multiple myeloma EXAM: METASTATIC BONE SURVEY COMPARISON:  None. FINDINGS: No focal bony lesions are identified. Bony mineralization appears normal. IMPRESSION: No focal bony lesions. Electronically Signed   By: Margarette Canada M.D.   On: 07/22/2018 20:43   Ct Bone Marrow Biopsy & Aspiration  Result Date: 07/24/2018 INDICATION: 58 year old male with a history of multiple myeloma EXAM: CT BONE MARROW BIOPSY AND ASPIRATION MEDICATIONS: None. ANESTHESIA/SEDATION: Moderate (conscious) sedation was employed during this procedure. A total of Versed 1.5 mg and Fentanyl 75 mcg was administered intravenously. Moderate Sedation Time: 13 minutes. The patient's level of consciousness and vital signs were monitored continuously by radiology nursing throughout the procedure under my direct supervision. FLUOROSCOPY TIME:  CT COMPLICATIONS: None PROCEDURE: The procedure risks, benefits, and alternatives were explained to the patient. Questions regarding the procedure were encouraged and answered. The patient understands and consents to the procedure. Scout CT of the pelvis was performed for surgical planning purposes. The posterior pelvis was prepped with Chlorhexidine in a sterile fashion, and a sterile drape was applied covering the operative field. A sterile gown and sterile gloves were used for the procedure. Local anesthesia was provided with 1% Lidocaine. Posterior iliac bone was targeted for biopsy. The skin and subcutaneous tissues were  infiltrated with 1% lidocaine without epinephrine. A small stab incision was made with an 11 blade scalpel, and an 11 gauge Murphy needle was advanced with CT guidance to the posterior cortex. Manual forced was used to advance the needle through the posterior cortex and the stylet was removed. A bone marrow aspirate was retrieved and passed to a cytotechnologist in the room. The Murphy needle was then advanced without the stylet for a core biopsy. The core biopsy was retrieved and also passed to a cytotechnologist. Manual pressure was used for hemostasis and a sterile dressing was placed. No complications were encountered no significant blood loss was encountered. Patient tolerated the procedure well and remained hemodynamically stable throughout. IMPRESSION: Status post CT-guided bone marrow biopsy, with tissue specimen sent to pathology for complete histopathologic analysis Signed, Dulcy Fanny. Earleen Newport, DO [facility] Radiology Electronically Signed   By:  Corrie Mckusick D.O.   On: 07/24/2018 12:02   US Biopsy (kidney)  Result Date: 07/22/2018 INDICATION: 58 year old male with acute kidney injury and hypertension. He presents for ultrasound-guided renal biopsy. EXAM: ULTRASOUND GUIDED RENAL BIOPSY COMPARISON:  None. MEDICATIONS: Fentanyl 75 mcg IV; Versed 2 mg IV ANESTHESIA/SEDATION: Total Moderate Sedation time Twelve minutes COMPLICATIONS: None immediate PROCEDURE: Informed written consent was obtained from the patient after a discussion of the risks, benefits and alternatives to treatment. The patient understands and consents the procedure. A timeout was performed prior to the initiation of the procedure. Ultrasound scanning was performed of the bilateral flanks. The inferior pole of the left kidney was selected for biopsy due to location and sonographic window. The procedure was planned. The operative site was prepped and draped in the usual sterile fashion. The  overlying soft tissues were anesthetized with 1% lidocaine with epinephrine. A 17 gauge core needle biopsy device was advanced into the inferior cortex of the left kidney and 2 core biopsies were obtained under direct ultrasound guidance. Images were saved for documentation purposes. The biopsy device was removed and hemostasis was obtained with manual compression. Post procedural scanning was negative for significant post procedural hemorrhage or additional complication. There is a small perinephric hematoma about the lower pole. A dressing was placed. The patient tolerated the procedure well without immediate post procedural complication. IMPRESSION: Technically successful ultrasound guided left renal biopsy. Electronically Signed   By: Jacqulynn Cadet M.D.   On: 07/22/2018 18:07     Medications:    . sodium chloride   Intravenous Once  . amLODipine  10 mg Oral Daily  . fentaNYL      . folic acid  1 mg Oral Daily  . lidocaine      . midazolam      . multivitamin with minerals  1 tablet Oral Daily  . predniSONE  60 mg Oral Q breakfast  . senna-docusate  1 tablet Oral BID  . sodium bicarbonate  1,300 mg Oral BID  . thiamine  100 mg Oral Daily   Or  . 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  IV Solu-Medrol administered 1 g daily x3 doses has transitioned to oral prednisone 07/23/2018 we will start 60 mg daily..  Will await renal biopsy results before initiating cytotoxic agents. He does have positive M spike.  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  Will order bone survey.  Creatinine appears a little better today.  He is scheduled for bone marrow biopsy.  He is asymptomatic with no uremic signs and symptoms at this point.  Will have a tunneled dialysis catheter placed tomorrow in anticipation that  dialysis may be needed.  Renal biopsy was performed 07/22/2018  Nonischemic cardiomyopathy last 2D echo was 2017 normal EF.  Recommend repeat 2D echo.  Pancytopenia fixation positive M spike urine .  Hemoglobin 4.7 on admission 3 units packed red blood cells transfused.  Hemoglobin has dropped status post biopsy no blood noted in urine.  Transfused 1 unit packed red blood cells 0/16/5537  Metabolic acidosis continue sodium bicarbonate  Hypertension/volume appears euvolemic   LOS: [place] '@TODAY''@12'$ :21 PM

## 2018-07-25 ENCOUNTER — Inpatient Hospital Stay (HOSPITAL_COMMUNITY): Payer: BLUE CROSS/BLUE SHIELD

## 2018-07-25 ENCOUNTER — Encounter (HOSPITAL_COMMUNITY): Payer: Self-pay | Admitting: Interventional Radiology

## 2018-07-25 HISTORY — PX: IR FLUORO GUIDE CV LINE RIGHT: IMG2283

## 2018-07-25 HISTORY — PX: IR US GUIDE VASC ACCESS RIGHT: IMG2390

## 2018-07-25 LAB — CBC
HCT: 22.1 % — ABNORMAL LOW (ref 39.0–52.0)
Hemoglobin: 7.5 g/dL — ABNORMAL LOW (ref 13.0–17.0)
MCH: 29.5 pg (ref 26.0–34.0)
MCHC: 33.9 g/dL (ref 30.0–36.0)
MCV: 87 fL (ref 80.0–100.0)
Platelets: 121 10*3/uL — ABNORMAL LOW (ref 150–400)
RBC: 2.54 MIL/uL — ABNORMAL LOW (ref 4.22–5.81)
RDW: 13.3 % (ref 11.5–15.5)
WBC: 5.2 10*3/uL (ref 4.0–10.5)
nRBC: 0 % (ref 0.0–0.2)

## 2018-07-25 LAB — RENAL FUNCTION PANEL
ALBUMIN: 3.1 g/dL — AB (ref 3.5–5.0)
Anion gap: 13 (ref 5–15)
BUN: 127 mg/dL — ABNORMAL HIGH (ref 6–20)
CO2: 23 mmol/L (ref 22–32)
Calcium: 9 mg/dL (ref 8.9–10.3)
Chloride: 103 mmol/L (ref 98–111)
Creatinine, Ser: 6 mg/dL — ABNORMAL HIGH (ref 0.61–1.24)
GFR calc Af Amer: 11 mL/min — ABNORMAL LOW (ref >60)
GFR calc non Af Amer: 9 mL/min — ABNORMAL LOW (ref >60)
Glucose, Bld: 145 mg/dL — ABNORMAL HIGH (ref 70–99)
PHOSPHORUS: 5.6 mg/dL — AB (ref 2.5–4.6)
Potassium: 4.1 mmol/L (ref 3.5–5.1)
Sodium: 139 mmol/L (ref 135–145)

## 2018-07-25 MED ORDER — CYCLOPHOSPHAMIDE 50 MG PO CAPS
50.0000 mg | ORAL_CAPSULE | Freq: Every day | ORAL | Status: DC
  Administered 2018-07-26 – 2018-07-28 (×3): 50 mg via ORAL
  Filled 2018-07-25 (×4): qty 1

## 2018-07-25 MED ORDER — CEFAZOLIN SODIUM-DEXTROSE 2-4 GM/100ML-% IV SOLN
INTRAVENOUS | Status: AC
  Administered 2018-07-25: 2000 mg via INTRAVENOUS
  Filled 2018-07-25: qty 100

## 2018-07-25 MED ORDER — CEFAZOLIN SODIUM-DEXTROSE 2-4 GM/100ML-% IV SOLN
2.0000 g | Freq: Three times a day (TID) | INTRAVENOUS | Status: DC
  Administered 2018-07-25: 2000 mg via INTRAVENOUS
  Administered 2018-07-25 – 2018-07-26 (×2): 2 g via INTRAVENOUS
  Filled 2018-07-25 (×4): qty 100

## 2018-07-25 MED ORDER — MIDAZOLAM HCL 2 MG/2ML IJ SOLN
INTRAMUSCULAR | Status: AC | PRN
  Administered 2018-07-25: 0.5 mg via INTRAVENOUS
  Administered 2018-07-25: 1 mg via INTRAVENOUS

## 2018-07-25 MED ORDER — DOXAZOSIN MESYLATE 2 MG PO TABS
2.0000 mg | ORAL_TABLET | Freq: Two times a day (BID) | ORAL | Status: DC
  Administered 2018-07-25 – 2018-07-27 (×4): 2 mg via ORAL
  Filled 2018-07-25 (×4): qty 1

## 2018-07-25 MED ORDER — LIDOCAINE HCL 1 % IJ SOLN
INTRAMUSCULAR | Status: AC
  Filled 2018-07-25: qty 20

## 2018-07-25 MED ORDER — FENTANYL CITRATE (PF) 100 MCG/2ML IJ SOLN
INTRAMUSCULAR | Status: AC | PRN
  Administered 2018-07-25: 25 ug via INTRAVENOUS
  Administered 2018-07-25: 50 ug via INTRAVENOUS

## 2018-07-25 MED ORDER — MIDAZOLAM HCL 2 MG/2ML IJ SOLN
INTRAMUSCULAR | Status: AC
  Filled 2018-07-25: qty 4

## 2018-07-25 MED ORDER — HEPARIN SODIUM (PORCINE) 1000 UNIT/ML IJ SOLN
INTRAMUSCULAR | Status: AC
  Filled 2018-07-25: qty 1

## 2018-07-25 MED ORDER — LIDOCAINE HCL (PF) 1 % IJ SOLN
INTRAMUSCULAR | Status: AC | PRN
  Administered 2018-07-25: 10 mL

## 2018-07-25 MED ORDER — FENTANYL CITRATE (PF) 100 MCG/2ML IJ SOLN
INTRAMUSCULAR | Status: AC
  Filled 2018-07-25: qty 4

## 2018-07-25 NOTE — Sedation Documentation (Signed)
Patient denies pain and is resting comfortably.  

## 2018-07-25 NOTE — Progress Notes (Signed)
[facility] ROUNDING NOTE   Subjective:   History this is a very pleasant gentleman with stage III chronic kidney disease 58 years of age history of alcohol use and congestive heart failure.  He was admitted with acute kidney injury and a nephritic appearing urine sediment.  His serology revealed an elevated MPO antibody of greater than 100 anti-histone antibody positive and his anti-GBM antibody was negative.  His kappa lambda ratio was also elevated.  Some of his other serologies were negative including an ANA hepatitis B C HIV and ASO.  He underwent successful renal biopsy 07/23/2018.  His hydralazine has been discontinued as there was consideration given to drug-induced vasculitis.  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  Discussed the case with Dr. Anthony Sar at [facility]..  Baseline serum creatinine appears to be about 1.3.  Creatinine on admission was 6.23.  We will proceed with treatment with Cytoxan 50 mg/dL.  Low a dose use due to creatinine of 6 and GFR of less than 15 cc/min  Blood pressure 151/73 pulse 54 temperature 97.7 O2 sats 100% room air  Sodium 139 potassium 4.1 chloride 103 CO2 23 BUN 127 creatinine 6.0 glucose 145 calcium 9.0 WBC 5.2 hemoglobin 7.5 platelets 121.  Urine output 3.4 L 07/24/2018 weight 64.4 kg      Prednisone 60 mg daily, sodium bicarbonate 1.3 g twice daily, Protonix 40 mg daily, folic acid 1 mg daily , multivitamins 1 daily  Chest x-ray showed suspected groundglass infiltrate left and right lung with mild cardiomegaly   Objective:  Vital signs in last 24 hours:  Temp:  [97.5 F (36.4 C)-98 F (36.7 C)] 97.7 F (36.5 C) (03/13 1535) Pulse Rate:  [52-65] 54 (03/13 1535) Resp:  [14-21] 16 (03/13 1500) BP: (138-180)/(70-77) 151/73 (03/13 1535) SpO2:  [99 %-100 %] 100 % (03/13 1535) Weight:  [64.4 kg] 64.4 kg (03/12 2201)  Weight change: 0.8 kg Filed Weights    07/23/18 0300 07/23/18 2110 07/24/18 2201  Weight: 63.5 kg 63.6 kg 64.4 kg    Intake/Output: I/O last 3 completed shifts: In: 120 [P.O.:120] Out: 4000 [Urine:4000]   Intake/Output this shift:  Total I/O In: 240 [P.O.:240] Out: 250 [Urine:250] Alert pleasant gentleman no obvious distress CVS- RRR murmurs rubs or gallops RS- CTA no wheezes or rales ABD- BS present soft non-distended EXT- no edema   Basic Metabolic Panel: Recent Labs  Lab 07/21/18 0540 07/22/18 0420 07/23/18 0353 07/24/18 0432 07/25/18 0421  NA 137 136 134* 141 139  K 4.4 4.4 4.8 4.2 4.1  CL 106 103 103 107 103  CO2 17* 19* 21* 22 23  GLUCOSE 205* 231* 258* 127* 145*  BUN 123* 129* 137* 131* 127*  CREATININE 6.32* 6.38* 6.25* 6.12* 6.00*  CALCIUM 9.2 9.2 8.9 9.1 9.0  PHOS  --   --   --   --  5.6*    Liver Function Tests: Recent Labs  Lab 07/25/18 0421  ALBUMIN 3.1*   No results for input(s): LIPASE, AMYLASE in the last 168 hours. No results for input(s): AMMONIA in the last 168 hours.  CBC: Recent Labs  Lab 07/19/18 0455 07/21/18 0540 07/22/18 0420 07/23/18 0353 07/23/18 1807 07/24/18 0432 07/24/18 1505 07/25/18 0744  WBC 3.6* 4.7 6.5 6.5  --  5.8  --  5.2  NEUTROABS 2.7  --   --   --   --  4.6  --   --  HGB 7.8* 8.2* 8.3* 6.4* 8.2* 7.6* 8.2* 7.5*  HCT 24.0* 24.6* 25.6* 20.0* 24.4* 22.1* 24.6* 22.1*  MCV 88.6 88.2 88.3 89.7  --  86.7  --  87.0  PLT 126* 128* 112* 139*  --  126*  --  121*    Cardiac Enzymes: No results for input(s): CKTOTAL, CKMB, CKMBINDEX, TROPONINI in the last 168 hours.  BNP: Invalid input(s): POCBNP  CBG: No results for input(s): GLUCAP in the last 168 hours.  Microbiology: Results for orders placed or performed during the hospital encounter of 11/02/13  MRSA PCR Screening     Status: None   Collection Time: 11/02/13 10:31 PM  Result Value Ref Range Status   MRSA by PCR NEGATIVE NEGATIVE Final    Comment:        The GeneXpert MRSA Assay  (FDA approved for NASAL specimens only), is one component of a comprehensive MRSA colonization surveillance program. It is not intended to diagnose MRSA infection nor to guide or monitor treatment for MRSA infections.    Coagulation Studies: Recent Labs    07/24/18 0432  LABPROT 15.8*  INR 1.3*    Urinalysis: No results for input(s): COLORURINE, LABSPEC, PHURINE, GLUCOSEU, HGBUR, BILIRUBINUR, KETONESUR, PROTEINUR, UROBILINOGEN, NITRITE, LEUKOCYTESUR in the last 72 hours.  Invalid input(s): APPERANCEUR    Imaging: Ir Fluoro Guide Cv Line Right  Result Date: 07/25/2018 INDICATION: 58 year old male with acute kidney disease in need of hemodialysis. EXAM: TUNNELED CENTRAL VENOUS HEMODIALYSIS CATHETER PLACEMENT WITH ULTRASOUND AND FLUOROSCOPIC GUIDANCE MEDICATIONS: 2 g Ancef. The antibiotic was given in an appropriate time interval prior to skin puncture. ANESTHESIA/SEDATION: Moderate (conscious) sedation was employed during this procedure. A total of Versed 1.5 mg and Fentanyl 75 mcg was administered intravenously. Moderate Sedation Time: 15 minutes. The patient's level of consciousness and vital signs were monitored continuously by radiology nursing throughout the procedure under my direct supervision. FLUOROSCOPY TIME:  Fluoroscopy Time: 0 minutes 12 seconds (0 mGy). COMPLICATIONS: None immediate. PROCEDURE: Informed written consent was obtained from the patient after a discussion of the risks, benefits, and alternatives to treatment. Questions regarding the procedure were encouraged and answered. The right neck and chest were prepped with chlorhexidine in a sterile fashion, and a sterile drape was applied covering the operative field. Maximum barrier sterile technique with sterile gowns and gloves were used for the procedure. A timeout was performed prior to the initiation of the procedure. After creating a small venotomy incision, a micropuncture kit was utilized to access the right  internal jugular vein under direct, real-time ultrasound guidance after the overlying soft tissues were anesthetized with 1% lidocaine with epinephrine. Ultrasound image documentation was performed. The microwire was kinked to measure appropriate catheter length. A stiff Glidewire was advanced to the level of the IVC and the micropuncture sheath was exchanged for a peel-away sheath. A palindrome tunneled hemodialysis catheter measuring 19 cm from tip to cuff was tunneled in a retrograde fashion from the anterior chest wall to the venotomy incision. The catheter was then placed through the peel-away sheath with tips ultimately positioned within the superior aspect of the right atrium. Final catheter positioning was confirmed and documented with a spot radiographic image. The catheter aspirates and flushes normally. The catheter was flushed with appropriate volume heparin dwells. The catheter exit site was secured with a 0-Prolene retention suture. The venotomy incision was closed with an interrupted 4-0 Vicryl, Dermabond and Steri-strips. Dressings were applied. The patient tolerated the procedure well without immediate post procedural complication. IMPRESSION: Successful  placement of 19 cm tip to cuff tunneled hemodialysis catheter via the right internal jugular vein with tips terminating within the superior aspect of the right atrium. The catheter is ready for immediate use. Electronically Signed   By: Jacqulynn Cadet M.D.   On: 07/25/2018 15:49   Ir US Guide Vasc Access Right  Result Date: 07/25/2018 INDICATION: 58 year old male with acute kidney disease in need of hemodialysis. EXAM: TUNNELED CENTRAL VENOUS HEMODIALYSIS CATHETER PLACEMENT WITH ULTRASOUND AND FLUOROSCOPIC GUIDANCE MEDICATIONS: 2 g Ancef. The antibiotic was given in an appropriate time interval prior to skin puncture. ANESTHESIA/SEDATION: Moderate (conscious) sedation was employed during this procedure. A total of Versed 1.5 mg and Fentanyl  75 mcg was administered intravenously. Moderate Sedation Time: 15 minutes. The patient's level of consciousness and vital signs were monitored continuously by radiology nursing throughout the procedure under my direct supervision. FLUOROSCOPY TIME:  Fluoroscopy Time: 0 minutes 12 seconds (0 mGy). COMPLICATIONS: None immediate. PROCEDURE: Informed written consent was obtained from the patient after a discussion of the risks, benefits, and alternatives to treatment. Questions regarding the procedure were encouraged and answered. The right neck and chest were prepped with chlorhexidine in a sterile fashion, and a sterile drape was applied covering the operative field. Maximum barrier sterile technique with sterile gowns and gloves were used for the procedure. A timeout was performed prior to the initiation of the procedure. After creating a small venotomy incision, a micropuncture kit was utilized to access the right internal jugular vein under direct, real-time ultrasound guidance after the overlying soft tissues were anesthetized with 1% lidocaine with epinephrine. Ultrasound image documentation was performed. The microwire was kinked to measure appropriate catheter length. A stiff Glidewire was advanced to the level of the IVC and the micropuncture sheath was exchanged for a peel-away sheath. A palindrome tunneled hemodialysis catheter measuring 19 cm from tip to cuff was tunneled in a retrograde fashion from the anterior chest wall to the venotomy incision. The catheter was then placed through the peel-away sheath with tips ultimately positioned within the superior aspect of the right atrium. Final catheter positioning was confirmed and documented with a spot radiographic image. The catheter aspirates and flushes normally. The catheter was flushed with appropriate volume heparin dwells. The catheter exit site was secured with a 0-Prolene retention suture. The venotomy incision was closed with an interrupted 4-0  Vicryl, Dermabond and Steri-strips. Dressings were applied. The patient tolerated the procedure well without immediate post procedural complication. IMPRESSION: Successful placement of 19 cm tip to cuff tunneled hemodialysis catheter via the right internal jugular vein with tips terminating within the superior aspect of the right atrium. The catheter is ready for immediate use. Electronically Signed   By: Jacqulynn Cadet M.D.   On: 07/25/2018 15:49   Ct Bone Marrow Biopsy & Aspiration  Result Date: 07/24/2018 INDICATION: 58 year old male with a history of multiple myeloma EXAM: CT BONE MARROW BIOPSY AND ASPIRATION MEDICATIONS: None. ANESTHESIA/SEDATION: Moderate (conscious) sedation was employed during this procedure. A total of Versed 1.5 mg and Fentanyl 75 mcg was administered intravenously. Moderate Sedation Time: 13 minutes. The patient's level of consciousness and vital signs were monitored continuously by radiology nursing throughout the procedure under my direct supervision. FLUOROSCOPY TIME:  CT COMPLICATIONS: None PROCEDURE: The procedure risks, benefits, and alternatives were explained to the patient. Questions regarding the procedure were encouraged and answered. The patient understands and consents to the procedure. Scout CT of the pelvis was performed for surgical planning purposes. The posterior pelvis was  prepped with Chlorhexidine in a sterile fashion, and a sterile drape was applied covering the operative field. A sterile gown and sterile gloves were used for the procedure. Local anesthesia was provided with 1% Lidocaine. Posterior iliac bone was targeted for biopsy. The skin and subcutaneous tissues were infiltrated with 1% lidocaine without epinephrine. A small stab incision was made with an 11 blade scalpel, and an 11 gauge Murphy needle was advanced with CT guidance to the posterior cortex. Manual forced was used to advance the needle through the posterior cortex and the stylet was  removed. A bone marrow aspirate was retrieved and passed to a cytotechnologist in the room. The Murphy needle was then advanced without the stylet for a core biopsy. The core biopsy was retrieved and also passed to a cytotechnologist. Manual pressure was used for hemostasis and a sterile dressing was placed. No complications were encountered no significant blood loss was encountered. Patient tolerated the procedure well and remained hemodynamically stable throughout. IMPRESSION: Status post CT-guided bone marrow biopsy, with tissue specimen sent to pathology for complete histopathologic analysis Signed, Dulcy Fanny. Earleen Newport, DO [facility] Radiology Electronically Signed   By: Corrie Mckusick D.O.   On: 07/24/2018 12:02     Medications:   .  ceFAZolin (ANCEF) IV     . sodium chloride   Intravenous Once  . amLODipine  10 mg Oral Daily  . cyclophosphamide  50 mg Oral Daily  . famotidine  20 mg Oral Daily  . fentaNYL      . folic acid  1 mg Oral Daily  . heparin      . lidocaine      . midazolam      . multivitamin with minerals  1 tablet Oral Daily  . predniSONE  60 mg Oral Q breakfast  . senna-docusate  1 tablet Oral BID  . sodium bicarbonate  1,300 mg Oral BID  . thiamine  100 mg Oral Daily   Or  . 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  IV Solu-Medrol administered 1 g daily x3 doses has transitioned to oral prednisone 07/23/2018 we will start 60 mg daily..  Will await renal biopsy results before initiating cytotoxic agents. He does have positive M spike.  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  He is scheduled for bone marrow biopsy.  Will place dialysis cath placed tunneled in anticipation that patient may need  dialysis.  Renal biopsy consistent with vasculitis and cellular crescents.  Will add Cytoxan 50 mg daily.  This is renally adjusted due to patient having renal failure white count is in the 4 range.  Nonischemic cardiomyopathy last 2D echo was 2017 normal EF.  Recommend repeat 2D echo.  Pancytopenia fixation positive M spike urine .  Hemoglobin 4.7 on admission 3 units packed red blood cells transfused.  Hemoglobin has dropped status post biopsy no blood noted in urine.  Transfused 1 unit packed red blood cells 9/35/7017  Metabolic acidosis continue sodium bicarbonate  Hypertension/volume appears euvolemic  Hypertension currently using amlodipine 10 mg daily.  We will continue to follow blood pressure appears to be a little better.  He is bradycardic and would not use beta-blocker.  Due to his renal failure I am not sure I would reach for an ACE  inhibitor or ARB at this time.  Hydralazine has been implicated in drug-induced vasculitis.  Will add doxazosin 2 mg twice daily   LOS: 9 Sherril Croon '@TODAY''@4'$ :46 PM

## 2018-07-25 NOTE — Procedures (Signed)
Interventional Radiology Procedure Note  Procedure: Right IJ 19 cm Palindrome tunneled HD catheter.  Tip in the upper RA and ready for use.    Complications: NOne  Estimated Blood Loss: None  Recommendations: - ROutine line care  Signed,  Criselda Peaches, MD

## 2018-07-25 NOTE — Progress Notes (Signed)
PROGRESS NOTE    Jeremy Grant  ZDG:[id number] DOB: 1961/01/20 DOA: 07/16/2018 PCP: Hayden Rasmussen, MD    Brief Narrative: 58 year old with past medical history significant for hypertension, ethanol abuse, nonischemic cardiomyopathy had a ejection fraction 20% with grade 3 diastolic dysfunction in 5956, thankfully normalized with normal repeated echo in 2017.  Patient presents with new onset anemia and renal failure.  He was seen by Dr. Delfino Lovett primary care doctor in the office who referred him for admission.  Patient hemoglobin was at 5 and a creatinine at 5. Patient admitted with renal failure and anemia.  Assessment & Plan:   Principal Problem:   Acute kidney failure (HCC) Active Problems:   Malignant hypertension   NICM (nonischemic cardiomyopathy) (HCC)   History of ETOH abuse   Chronic combined systolic and diastolic CHF (congestive heart failure) (HCC)   Normocytic normochromic anemia   Pancytopenia (HCC)   Multiple myeloma (HCC)   Elevated serum protein level   Estimated body mass index is 18.73 kg/m as calculated from the following:   Height as of this encounter: '6\' 1"'$  (1.854 m).   Weight as of this encounter: 64.4 kg.  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  Hydralazine has been discontinued. He received  IV Solu-Medrol per nephrology recommendation.  Patient to 60 mg of prednisone daily. He also has an spike on SPEP UPEP.  Skeletal bone survey negative.  Oncology  consulted. He underwent renal biopsy on 3/10. Biopsy; preliminary consistent with POA, ANCA positive crescent  necrotizing glomerulonephritis. Follow Dr Web recommendation. Nephrology following. Nephrology recommending tunneled dialysis catheter placement.\  2-Pancytopenia; Present with a hemoglobin at 4 on admission.  He received 3 units of packed red blood cell.  He has also received IV iron. SPEP UPEP with spike  M. Oncology has been consulted.  Patient underwent Bone Marrow biopsy 3-12. Awaiting Bone marrow biopsy results.   3-Acute blood loss anemia: After biopsy suspect hematoma post biopsy. Received 2 units PRBC 3-11. Hb at 7.5. repeat labs tomorrow.    History of nonischemic cardiomyopathy: Normalized by echo 2017.  Monitor. Repeated ECHO with normal EF 3-12.   HTN; resume Norvasc.  Unable to use hydralazine due to concern for hydralazine causing glomerulonephritis.  Could use clonidine PRN    DVT prophylaxis: SCDs Code Status: Full code Family Communication: Wife at bedside Disposition Plan: Remain in the hospital for blood transfusion and further evaluation of pancytopenia and renal failure  Consultants:   Nephrology  Oncology  IR  Procedures:  Renal biopsy 3/10   Antimicrobials:   None   Subjective: He is NPO for tunnel catheter , he wants to eat. He spoke with DR web regarding biopsy results.   Objective: Vitals:   07/24/18 1646 07/24/18 2201 07/25/18 0454 07/25/18 1006  BP: (!) 150/79 (!) 166/77 (!) 180/74 (!) 172/70  Pulse: 67 65 (!) 52 (!) 57  Resp: 18 20 (!) 21 18  Temp: 97.8 F (36.6 C) (!) 97.5 F (36.4 C) 98 F (36.7 C) 98 F (36.7 C)  TempSrc: Oral Oral Oral Oral  SpO2: 100% 100% 100% 100%  Weight:  64.4 kg    Height:        Intake/Output Summary (Last 24 hours) at 07/25/2018 1228 Last data filed at 07/25/2018 1145 Gross per 24 hour  Intake 120 ml  Output 1550 ml  Net -1430 ml   Filed Weights   07/23/18 0300 07/23/18 2110 07/24/18 2201  Weight:  63.5 kg 63.6 kg 64.4 kg    Examination:  General exam: NAD Respiratory system: CTA Cardiovascular system: S 1, S 2 RRR Gastrointestinal system:BS present, soft, nt Central nervous system: alert Extremities: symmetric power.   Data Reviewed: I have personally reviewed following labs and imaging studies  CBC: Recent Labs  Lab 07/19/18 0455 07/21/18 0540 07/22/18 0420 07/23/18 0353  07/23/18 1807 07/24/18 0432 07/24/18 1505 07/25/18 0744  WBC 3.6* 4.7 6.5 6.5  --  5.8  --  5.2  NEUTROABS 2.7  --   --   --   --  4.6  --   --   HGB 7.8* 8.2* 8.3* 6.4* 8.2* 7.6* 8.2* 7.5*  HCT 24.0* 24.6* 25.6* 20.0* 24.4* 22.1* 24.6* 22.1*  MCV 88.6 88.2 88.3 89.7  --  86.7  --  87.0  PLT 126* 128* 112* 139*  --  126*  --  510*   Basic Metabolic Panel: Recent Labs  Lab 07/21/18 0540 07/22/18 0420 07/23/18 0353 07/24/18 0432 07/25/18 0421  NA 137 136 134* 141 139  K 4.4 4.4 4.8 4.2 4.1  CL 106 103 103 107 103  CO2 17* 19* 21* 22 23  GLUCOSE 205* 231* 258* 127* 145*  BUN 123* 129* 137* 131* 127*  CREATININE 6.32* 6.38* 6.25* 6.12* 6.00*  CALCIUM 9.2 9.2 8.9 9.1 9.0  PHOS  --   --   --   --  5.6*   GFR: Estimated Creatinine Clearance: 12.2 mL/min (A) (by C-G formula based on SCr of 6 mg/dL (H)). Liver Function Tests: Recent Labs  Lab 07/25/18 0421  ALBUMIN 3.1*   No results for input(s): LIPASE, AMYLASE in the last 168 hours. No results for input(s): AMMONIA in the last 168 hours. Coagulation Profile: Recent Labs  Lab 07/22/18 0420 07/24/18 0432  INR 1.1 1.3*   Cardiac Enzymes: No results for input(s): CKTOTAL, CKMB, CKMBINDEX, TROPONINI in the last 168 hours. BNP (last 3 results) No results for input(s): PROBNP in the last 8760 hours. HbA1C: No results for input(s): HGBA1C in the last 72 hours. CBG: No results for input(s): GLUCAP in the last 168 hours. Lipid Profile: No results for input(s): CHOL, HDL, LDLCALC, TRIG, CHOLHDL, LDLDIRECT in the last 72 hours. Thyroid Function Tests: No results for input(s): TSH, T4TOTAL, FREET4, T3FREE, THYROIDAB in the last 72 hours. Anemia Panel: No results for input(s): VITAMINB12, FOLATE, FERRITIN, TIBC, IRON, RETICCTPCT in the last 72 hours. Sepsis Labs: No results for input(s): PROCALCITON, LATICACIDVEN in the last 168 hours.  No results found for this or any previous visit (from the past 240 hour(s)).        Radiology Studies: Ct Bone Marrow Biopsy & Aspiration  Result Date: 07/24/2018 INDICATION: 58 year old male with a history of multiple myeloma EXAM: CT BONE MARROW BIOPSY AND ASPIRATION MEDICATIONS: None. ANESTHESIA/SEDATION: Moderate (conscious) sedation was employed during this procedure. A total of Versed 1.5 mg and Fentanyl 75 mcg was administered intravenously. Moderate Sedation Time: 13 minutes. The patient's level of consciousness and vital signs were monitored continuously by radiology nursing throughout the procedure under my direct supervision. FLUOROSCOPY TIME:  CT COMPLICATIONS: None PROCEDURE: The procedure risks, benefits, and alternatives were explained to the patient. Questions regarding the procedure were encouraged and answered. The patient understands and consents to the procedure. Scout CT of the pelvis was performed for surgical planning purposes. The posterior pelvis was prepped with Chlorhexidine in a sterile fashion, and a sterile drape was applied covering the operative field. A sterile gown  and sterile gloves were used for the procedure. Local anesthesia was provided with 1% Lidocaine. Posterior iliac bone was targeted for biopsy. The skin and subcutaneous tissues were infiltrated with 1% lidocaine without epinephrine. A small stab incision was made with an 11 blade scalpel, and an 11 gauge Murphy needle was advanced with CT guidance to the posterior cortex. Manual forced was used to advance the needle through the posterior cortex and the stylet was removed. A bone marrow aspirate was retrieved and passed to a cytotechnologist in the room. The Murphy needle was then advanced without the stylet for a core biopsy. The core biopsy was retrieved and also passed to a cytotechnologist. Manual pressure was used for hemostasis and a sterile dressing was placed. No complications were encountered no significant blood loss was encountered. Patient tolerated the procedure well and  remained hemodynamically stable throughout. IMPRESSION: Status post CT-guided bone marrow biopsy, with tissue specimen sent to pathology for complete histopathologic analysis Signed, Dulcy Fanny. Earleen Newport, DO [facility] Radiology Electronically Signed   By: Corrie Mckusick D.O.   On: 07/24/2018 12:02        Scheduled Meds:  sodium chloride   Intravenous Once   amLODipine  10 mg Oral Daily   famotidine  20 mg Oral Daily   folic acid  1 mg Oral Daily   multivitamin with minerals  1 tablet Oral Daily   predniSONE  60 mg Oral Q breakfast   senna-docusate  1 tablet Oral BID   sodium bicarbonate  1,300 mg Oral BID   thiamine  100 mg Oral Daily   Or   thiamine  100 mg Intravenous Daily   Continuous Infusions:   LOS: 9 days    Time spent: 35 minutes.     Elmarie Shiley, MD [facility] Pager [phone]  If 7PM-7AM, please contact night-coverage [email or web address] Password [facility] 07/25/2018, 12:28 PM

## 2018-07-26 LAB — CBC
HCT: 25.4 % — ABNORMAL LOW (ref 39.0–52.0)
Hemoglobin: 8.5 g/dL — ABNORMAL LOW (ref 13.0–17.0)
MCH: 29.3 pg (ref 26.0–34.0)
MCHC: 33.5 g/dL (ref 30.0–36.0)
MCV: 87.6 fL (ref 80.0–100.0)
Platelets: 127 10*3/uL — ABNORMAL LOW (ref 150–400)
RBC: 2.9 MIL/uL — ABNORMAL LOW (ref 4.22–5.81)
RDW: 13.2 % (ref 11.5–15.5)
WBC: 6.6 10*3/uL (ref 4.0–10.5)
nRBC: 0 % (ref 0.0–0.2)

## 2018-07-26 LAB — RENAL FUNCTION PANEL
Albumin: 3.4 g/dL — ABNORMAL LOW (ref 3.5–5.0)
Anion gap: 15 (ref 5–15)
BUN: 119 mg/dL — ABNORMAL HIGH (ref 6–20)
CO2: 24 mmol/L (ref 22–32)
Calcium: 9.3 mg/dL (ref 8.9–10.3)
Chloride: 99 mmol/L (ref 98–111)
Creatinine, Ser: 5.87 mg/dL — ABNORMAL HIGH (ref 0.61–1.24)
GFR calc Af Amer: 11 mL/min — ABNORMAL LOW (ref >60)
GFR calc non Af Amer: 10 mL/min — ABNORMAL LOW (ref >60)
GLUCOSE: 131 mg/dL — AB (ref 70–99)
Phosphorus: 5.5 mg/dL — ABNORMAL HIGH (ref 2.5–4.6)
Potassium: 4.2 mmol/L (ref 3.5–5.1)
Sodium: 138 mmol/L (ref 135–145)

## 2018-07-26 MED ORDER — CEFAZOLIN SODIUM-DEXTROSE 2-4 GM/100ML-% IV SOLN
2.0000 g | Freq: Two times a day (BID) | INTRAVENOUS | Status: DC
  Administered 2018-07-26 – 2018-07-28 (×4): 2 g via INTRAVENOUS
  Filled 2018-07-26 (×4): qty 100

## 2018-07-26 NOTE — Progress Notes (Signed)
[facility] ROUNDING NOTE   Subjective:   History this is a very pleasant gentleman with stage III chronic kidney disease 58 years of age history of alcohol use and congestive heart failure.  He was admitted with acute kidney injury and a nephritic appearing urine sediment.  His serology revealed an elevated MPO antibody of greater than 100 anti-histone antibody positive and his anti-GBM antibody was negative.  His kappa lambda ratio was also elevated.  Some of his other serologies were negative including an ANA hepatitis B C HIV and ASO.  He underwent successful renal biopsy 07/23/2018.  His hydralazine has been discontinued as there was consideration given to drug-induced vasculitis.  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  Discussed the case with Dr. Anthony Sar at [facility]..  Baseline serum creatinine appears to be about 1.3.  Creatinine on admission was 6.23.  Started Cytoxan 50 mg/dL 07/25/2018.  Blood pressure 151/73 pulse 54 temperature 97.7 O2 sats 100% room air  Sodium 138 potassium 4.2 chloride 99 CO2 24 BUN 119 creatinine 5.87 glucose 131 phosphorus 5.5 albumin 3.4 calcium 9.3 WBC 6.6 hemoglobin 8.5 platelets 127  Urine output 3.575 L.  Last weight 07/24/2018 was 64.4 kg    Prednisone 60 mg daily started 07/21/2018, Cytoxan 50 mg daily first dose 07/25/2018 sodium bicarbonate 1.3 g twice daily, Protonix 40 mg daily, folic acid 1 mg daily , multivitamins 1 daily, doxazosin 2 mg twice daily, amlodipine 10 mg daily.  Chest x-ray showed suspected groundglass infiltrate left and right lung with mild cardiomegaly   Objective:  Vital signs in last 24 hours:  Temp:  [97.4 F (36.3 C)-97.8 F (36.6 C)] 97.8 F (36.6 C) (03/14 0814) Pulse Rate:  [54-64] 61 (03/14 0814) Resp:  [14-16] 16 (03/14 0814) BP: (138-169)/(73-82) 155/75 (03/14 0814) SpO2:  [99 %-100 %] 100 % (03/14 0814)  Weight change:   Filed Weights   07/23/18 0300 07/23/18 2110 07/24/18 2201  Weight: 63.5 kg 63.6 kg 64.4 kg    Intake/Output: I/O last 3 completed shifts: In: 540 [P.O.:540] Out: 5925 [Urine:5925]   Intake/Output this shift:  Total I/O In: 400 [P.O.:400] Out: -  Alert pleasant gentleman no obvious distress CVS- RRR murmurs rubs or gallops RS- CTA no wheezes or rales ABD- BS present soft non-distended EXT- no edema   Basic Metabolic Panel: Recent Labs  Lab 07/22/18 0420 07/23/18 0353 07/24/18 0432 07/25/18 0421 07/26/18 0752  NA 136 134* 141 139 138  K 4.4 4.8 4.2 4.1 4.2  CL 103 103 107 103 99  CO2 19* 21* _0 GLUCOSE 231* 258* 127* 145* 131*  BUN 129* 137* 131* 127* 119*  CREATININE 6.38* 6.25* 6.12* 6.00* 5.87*  CALCIUM 9.2 8.9 9.1 9.0 9.3  PHOS  --   --   --  5.6* 5.5*    Liver Function Tests: Recent Labs  Lab 07/25/18 0421 07/26/18 0752  ALBUMIN 3.1* 3.4*   No results for input(s): LIPASE, AMYLASE in the last 168 hours. No results for input(s): AMMONIA in the last 168 hours.  CBC: Recent Labs  Lab 07/22/18 0420 07/23/18 0353 07/23/18 1807 07/24/18 0432 07/24/18 1505 07/25/18 0744 07/26/18 0752  WBC 6.5 6.5  --  5.8  --  5.2 6.6  NEUTROABS  --   --   --  4.6  --   --   --   HGB 8.3* 6.4* 8.2* 7.6* 8.2* 7.5* 8.5*  HCT  25.6* 20.0* 24.4* 22.1* 24.6* 22.1* 25.4*  MCV 88.3 89.7  --  86.7  --  87.0 87.6  PLT 112* 139*  --  126*  --  121* 127*    Cardiac Enzymes: No results for input(s): CKTOTAL, CKMB, CKMBINDEX, TROPONINI in the last 168 hours.  BNP: Invalid input(s): POCBNP  CBG: No results for input(s): GLUCAP in the last 168 hours.  Microbiology: Results for orders placed or performed during the hospital encounter of 11/02/13  MRSA PCR Screening     Status: None   Collection Time: 11/02/13 10:31 PM  Result Value Ref Range Status   MRSA by PCR NEGATIVE NEGATIVE Final    Comment:        The GeneXpert MRSA Assay (FDA approved for NASAL  specimens only), is one component of a comprehensive MRSA colonization surveillance program. It is not intended to diagnose MRSA infection nor to guide or monitor treatment for MRSA infections.    Coagulation Studies: Recent Labs    07/24/18 0432  LABPROT 15.8*  INR 1.3*    Urinalysis: No results for input(s): COLORURINE, LABSPEC, PHURINE, GLUCOSEU, HGBUR, BILIRUBINUR, KETONESUR, PROTEINUR, UROBILINOGEN, NITRITE, LEUKOCYTESUR in the last 72 hours.  Invalid input(s): APPERANCEUR    Imaging: Ir Fluoro Guide Cv Line Right  Result Date: 07/25/2018 INDICATION: 58 year old male with acute kidney disease in need of hemodialysis. EXAM: TUNNELED CENTRAL VENOUS HEMODIALYSIS CATHETER PLACEMENT WITH ULTRASOUND AND FLUOROSCOPIC GUIDANCE MEDICATIONS: 2 g Ancef. The antibiotic was given in an appropriate time interval prior to skin puncture. ANESTHESIA/SEDATION: Moderate (conscious) sedation was employed during this procedure. A total of Versed 1.5 mg and Fentanyl 75 mcg was administered intravenously. Moderate Sedation Time: 15 minutes. The patient's level of consciousness and vital signs were monitored continuously by radiology nursing throughout the procedure under my direct supervision. FLUOROSCOPY TIME:  Fluoroscopy Time: 0 minutes 12 seconds (0 mGy). COMPLICATIONS: None immediate. PROCEDURE: Informed written consent was obtained from the patient after a discussion of the risks, benefits, and alternatives to treatment. Questions regarding the procedure were encouraged and answered. The right neck and chest were prepped with chlorhexidine in a sterile fashion, and a sterile drape was applied covering the operative field. Maximum barrier sterile technique with sterile gowns and gloves were used for the procedure. A timeout was performed prior to the initiation of the procedure. After creating a small venotomy incision, a micropuncture kit was utilized to access the right internal jugular vein under  direct, real-time ultrasound guidance after the overlying soft tissues were anesthetized with 1% lidocaine with epinephrine. Ultrasound image documentation was performed. The microwire was kinked to measure appropriate catheter length. A stiff Glidewire was advanced to the level of the IVC and the micropuncture sheath was exchanged for a peel-away sheath. A palindrome tunneled hemodialysis catheter measuring 19 cm from tip to cuff was tunneled in a retrograde fashion from the anterior chest wall to the venotomy incision. The catheter was then placed through the peel-away sheath with tips ultimately positioned within the superior aspect of the right atrium. Final catheter positioning was confirmed and documented with a spot radiographic image. The catheter aspirates and flushes normally. The catheter was flushed with appropriate volume heparin dwells. The catheter exit site was secured with a 0-Prolene retention suture. The venotomy incision was closed with an interrupted 4-0 Vicryl, Dermabond and Steri-strips. Dressings were applied. The patient tolerated the procedure well without immediate post procedural complication. IMPRESSION: Successful placement of 19 cm tip to cuff tunneled hemodialysis catheter via the right internal  jugular vein with tips terminating within the superior aspect of the right atrium. The catheter is ready for immediate use. Electronically Signed   By: Jacqulynn Cadet M.D.   On: 07/25/2018 15:49   Ir US Guide Vasc Access Right  Result Date: 07/25/2018 INDICATION: 58 year old male with acute kidney disease in need of hemodialysis. EXAM: TUNNELED CENTRAL VENOUS HEMODIALYSIS CATHETER PLACEMENT WITH ULTRASOUND AND FLUOROSCOPIC GUIDANCE MEDICATIONS: 2 g Ancef. The antibiotic was given in an appropriate time interval prior to skin puncture. ANESTHESIA/SEDATION: Moderate (conscious) sedation was employed during this procedure. A total of Versed 1.5 mg and Fentanyl 75 mcg was administered  intravenously. Moderate Sedation Time: 15 minutes. The patient's level of consciousness and vital signs were monitored continuously by radiology nursing throughout the procedure under my direct supervision. FLUOROSCOPY TIME:  Fluoroscopy Time: 0 minutes 12 seconds (0 mGy). COMPLICATIONS: None immediate. PROCEDURE: Informed written consent was obtained from the patient after a discussion of the risks, benefits, and alternatives to treatment. Questions regarding the procedure were encouraged and answered. The right neck and chest were prepped with chlorhexidine in a sterile fashion, and a sterile drape was applied covering the operative field. Maximum barrier sterile technique with sterile gowns and gloves were used for the procedure. A timeout was performed prior to the initiation of the procedure. After creating a small venotomy incision, a micropuncture kit was utilized to access the right internal jugular vein under direct, real-time ultrasound guidance after the overlying soft tissues were anesthetized with 1% lidocaine with epinephrine. Ultrasound image documentation was performed. The microwire was kinked to measure appropriate catheter length. A stiff Glidewire was advanced to the level of the IVC and the micropuncture sheath was exchanged for a peel-away sheath. A palindrome tunneled hemodialysis catheter measuring 19 cm from tip to cuff was tunneled in a retrograde fashion from the anterior chest wall to the venotomy incision. The catheter was then placed through the peel-away sheath with tips ultimately positioned within the superior aspect of the right atrium. Final catheter positioning was confirmed and documented with a spot radiographic image. The catheter aspirates and flushes normally. The catheter was flushed with appropriate volume heparin dwells. The catheter exit site was secured with a 0-Prolene retention suture. The venotomy incision was closed with an interrupted 4-0 Vicryl, Dermabond and  Steri-strips. Dressings were applied. The patient tolerated the procedure well without immediate post procedural complication. IMPRESSION: Successful placement of 19 cm tip to cuff tunneled hemodialysis catheter via the right internal jugular vein with tips terminating within the superior aspect of the right atrium. The catheter is ready for immediate use. Electronically Signed   By: Jacqulynn Cadet M.D.   On: 07/25/2018 15:49     Medications:   .  ceFAZolin (ANCEF) IV 2 g (07/26/18 0545)   . sodium chloride   Intravenous Once  . amLODipine  10 mg Oral Daily  . cyclophosphamide  50 mg Oral Daily  . doxazosin  2 mg Oral Q12H  . famotidine  20 mg Oral Daily  . folic acid  1 mg Oral Daily  . multivitamin with minerals  1 tablet Oral Daily  . predniSONE  60 mg Oral Q breakfast  . senna-docusate  1 tablet Oral BID  . sodium bicarbonate  1,300 mg Oral BID  . thiamine  100 mg Oral Daily   Or  . 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  IV Solu-Medrol administered 1 g daily x3 doses has transitioned to oral prednisone 07/23/2018  60 mg daily..  Will await renal biopsy results before initiating cytotoxic agents. He does have positive M spike.  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  He is undergone placement of a tunneled dialysis catheter in anticipation of dialysis.  However we will hold off on dialysis as his renal function continues to improve renal biopsy consistent with vasculitis and cellular crescents.  Cytoxan added 07/25/2018 50 mg daily.  This is renally adjusted due to patient having renal failure white count is in the 4 range.  Nonischemic cardiomyopathy last 2D echo was 2017 normal EF.    Pancytopenia fixation positive M spike urine .  Hemoglobin 4.7 on  admission 3 units packed red blood cells transfused.  Hemoglobin has dropped status post biopsy no blood noted in urine.  Transfused 1 unit packed red blood cells 12/06/3662  Metabolic acidosis continue sodium bicarbonate.  This seems to have resolved at this time we will continue to follow to make sure patient still needs sodium bicarbonate orally  Hypertension currently using amlodipine 10 mg daily.  We will continue to follow blood pressure appears to be a little better.  He is bradycardic and would not use beta-blocker.  Due to his renal failure I am not sure I would reach for an ACE inhibitor or ARB at this time.  Hydralazine has been implicated in drug-induced vasculitis.  Appears improved with doxazosin 2 mg twice daily   LOS: [place] _0 _1 :53 AM

## 2018-07-26 NOTE — Progress Notes (Signed)
PROGRESS NOTE    Jeremy Grant  QPY:[id number] DOB: March 18, 1961 DOA: 07/16/2018 PCP: Jeremy Rasmussen, MD    Brief Narrative: 58 year old with past medical history significant for hypertension, ethanol abuse, nonischemic cardiomyopathy had a ejection fraction 20% with grade 3 diastolic dysfunction in 1245, thankfully normalized with normal repeated echo in 2017.  Patient presents with new onset anemia and renal failure.  He was seen by Dr. Delfino Grant primary care doctor in the office who referred him for admission.  Patient hemoglobin was at 5 and a creatinine at 5. Patient admitted with renal failure and anemia.  Assessment & Plan:   Principal Problem:   Acute kidney failure (HCC) Active Problems:   Malignant hypertension   NICM (nonischemic cardiomyopathy) (HCC)   History of ETOH abuse   Chronic combined systolic and diastolic CHF (congestive heart failure) (HCC)   Normocytic normochromic anemia   Pancytopenia (HCC)   Multiple myeloma (HCC)   Elevated serum protein level   Estimated body mass index is 18.73 kg/m as calculated from the following:   Height as of this encounter: 6' 1" (1.854 m).   Weight as of this encounter: 64.4 kg.  1-AKI, metabolic acidosis:ANCA positive crescent  necrotizing glomerulonephritis. Positive antihistone antibody, low complement we were considering hydralazine induced vasculitis.  Hydralazine has been discontinued. He received  IV Solu-Medrol per nephrology recommendation.  Patient to 60 mg of prednisone daily. He also has an spike on SPEP UPEP.  Skeletal bone survey negative.  Oncology  consulted. He underwent renal biopsy on 3/10. Biopsy; preliminary consistent with POA, ANCA positive crescent  necrotizing glomerulonephritis. Nephrology recommending tunneled dialysis catheter placement._0 Started on cytoxan. Continue with prednisone.  Creatinine trending down.   2-Pancytopenia; Present with a hemoglobin at 4 on admission.  He received 3 units of  packed red blood cell.  He has also received IV iron. SPEP UPEP with spike M. Oncology has been consulted.  Patient underwent Bone Marrow biopsy 3-12. Awaiting Bone marrow biopsy results.   3-Acute blood loss anemia: After biopsy suspect hematoma post biopsy. Received 2 units PRBC 3-11. Hb improved today at 8   History of nonischemic cardiomyopathy: Normalized by echo 2017.  Monitor. Repeated ECHO with normal EF 3-12.   HTN; resume Norvasc.  Unable to use hydralazine due to concern for hydralazine causing glomerulonephritis.  Started on Cardura.      DVT prophylaxis: SCDs Code Status: Full code Family Communication: Wife at bedside Disposition Plan: Remain in the hospital for blood transfusion and further evaluation of pancytopenia and renal failure  Consultants:   Nephrology  Oncology  IR  Procedures:  Renal biopsy 3/10   Antimicrobials:   None   Subjective: He is feeling better. Denies pain.   Objective: Vitals:   07/25/18 1500 07/25/18 1535 07/26/18 0621 07/26/18 0814  BP: 138/74 (!) 151/73 (!) 167/82 (!) 155/75  Pulse: 62 (!) 54 60 61  Resp: _1 Temp:  97.7 F (36.5 C) (!) 97.4 F (36.3 C) 97.8 F (36.6 C)  TempSrc:  Oral Oral Oral  SpO2: 99% 100% 100% 100%  Weight:      Height:        Intake/Output Summary (Last 24 hours) at 07/26/2018 1405 Last data filed at 07/26/2018 1100 Gross per 24 hour  Intake 1040 ml  Output 3475 ml  Net -2435 ml   Filed Weights   07/23/18 0300 07/23/18 2110 07/24/18 2201  Weight: 63.5 kg 63.6 kg 64.4 kg    Examination:  General exam: NAD Respiratory system: CTA Cardiovascular system: S 1, S 2 RRR Gastrointestinal system: BS present, soft, nt Central nervous system: alert, non focal.  Extremities:Symmetric power.   Data Reviewed: I have personally reviewed following labs and imaging studies  CBC: Recent Labs  Lab 07/22/18 0420 07/23/18 0353 07/23/18 1807 07/24/18 0432 07/24/18 1505  07/25/18 0744 07/26/18 0752  WBC 6.5 6.5  --  5.8  --  5.2 6.6  NEUTROABS  --   --   --  4.6  --   --   --   HGB 8.3* 6.4* 8.2* 7.6* 8.2* 7.5* 8.5*  HCT 25.6* 20.0* 24.4* 22.1* 24.6* 22.1* 25.4*  MCV 88.3 89.7  --  86.7  --  87.0 87.6  PLT 112* 139*  --  126*  --  121* 989*   Basic Metabolic Panel: Recent Labs  Lab 07/22/18 0420 07/23/18 0353 07/24/18 0432 07/25/18 0421 07/26/18 0752  NA 136 134* 141 139 138  K 4.4 4.8 4.2 4.1 4.2  CL 103 103 107 103 99  CO2 19* 21* _0 GLUCOSE 231* 258* 127* 145* 131*  BUN 129* 137* 131* 127* 119*  CREATININE 6.38* 6.25* 6.12* 6.00* 5.87*  CALCIUM 9.2 8.9 9.1 9.0 9.3  PHOS  --   --   --  5.6* 5.5*   GFR: Estimated Creatinine Clearance: 12.5 mL/min (A) (by C-G formula based on SCr of 5.87 mg/dL (H)). Liver Function Tests: Recent Labs  Lab 07/25/18 0421 07/26/18 0752  ALBUMIN 3.1* 3.4*   No results for input(s): LIPASE, AMYLASE in the last 168 hours. No results for input(s): AMMONIA in the last 168 hours. Coagulation Profile: Recent Labs  Lab 07/22/18 0420 07/24/18 0432  INR 1.1 1.3*   Cardiac Enzymes: No results for input(s): CKTOTAL, CKMB, CKMBINDEX, TROPONINI in the last 168 hours. BNP (last 3 results) No results for input(s): PROBNP in the last 8760 hours. HbA1C: No results for input(s): HGBA1C in the last 72 hours. CBG: No results for input(s): GLUCAP in the last 168 hours. Lipid Profile: No results for input(s): CHOL, HDL, LDLCALC, TRIG, CHOLHDL, LDLDIRECT in the last 72 hours. Thyroid Function Tests: No results for input(s): TSH, T4TOTAL, FREET4, T3FREE, THYROIDAB in the last 72 hours. Anemia Panel: No results for input(s): VITAMINB12, FOLATE, FERRITIN, TIBC, IRON, RETICCTPCT in the last 72 hours. Sepsis Labs: No results for input(s): PROCALCITON, LATICACIDVEN in the last 168 hours.  No results found for this or any previous visit (from the past 240 hour(s)).       Radiology Studies: Ir Fluoro Guide Cv  Line Right  Result Date: 07/25/2018 INDICATION: 58 year old male with acute kidney disease in need of hemodialysis. EXAM: TUNNELED CENTRAL VENOUS HEMODIALYSIS CATHETER PLACEMENT WITH ULTRASOUND AND FLUOROSCOPIC GUIDANCE MEDICATIONS: 2 g Ancef. The antibiotic was given in an appropriate time interval prior to skin puncture. ANESTHESIA/SEDATION: Moderate (conscious) sedation was employed during this procedure. A total of Versed 1.5 mg and Fentanyl 75 mcg was administered intravenously. Moderate Sedation Time: 15 minutes. The patient's level of consciousness and vital signs were monitored continuously by radiology nursing throughout the procedure under my direct supervision. FLUOROSCOPY TIME:  Fluoroscopy Time: 0 minutes 12 seconds (0 mGy). COMPLICATIONS: None immediate. PROCEDURE: Informed written consent was obtained from the patient after a discussion of the risks, benefits, and alternatives to treatment. Questions regarding the procedure were encouraged and answered. The right neck and chest were prepped with chlorhexidine in a sterile fashion, and a sterile drape was applied covering  the operative field. Maximum barrier sterile technique with sterile gowns and gloves were used for the procedure. A timeout was performed prior to the initiation of the procedure. After creating a small venotomy incision, a micropuncture kit was utilized to access the right internal jugular vein under direct, real-time ultrasound guidance after the overlying soft tissues were anesthetized with 1% lidocaine with epinephrine. Ultrasound image documentation was performed. The microwire was kinked to measure appropriate catheter length. A stiff Glidewire was advanced to the level of the IVC and the micropuncture sheath was exchanged for a peel-away sheath. A palindrome tunneled hemodialysis catheter measuring 19 cm from tip to cuff was tunneled in a retrograde fashion from the anterior chest wall to the venotomy incision. The catheter  was then placed through the peel-away sheath with tips ultimately positioned within the superior aspect of the right atrium. Final catheter positioning was confirmed and documented with a spot radiographic image. The catheter aspirates and flushes normally. The catheter was flushed with appropriate volume heparin dwells. The catheter exit site was secured with a 0-Prolene retention suture. The venotomy incision was closed with an interrupted 4-0 Vicryl, Dermabond and Steri-strips. Dressings were applied. The patient tolerated the procedure well without immediate post procedural complication. IMPRESSION: Successful placement of 19 cm tip to cuff tunneled hemodialysis catheter via the right internal jugular vein with tips terminating within the superior aspect of the right atrium. The catheter is ready for immediate use. Electronically Signed   By: Jacqulynn Cadet M.D.   On: 07/25/2018 15:49   Ir US Guide Vasc Access Right  Result Date: 07/25/2018 INDICATION: 58 year old male with acute kidney disease in need of hemodialysis. EXAM: TUNNELED CENTRAL VENOUS HEMODIALYSIS CATHETER PLACEMENT WITH ULTRASOUND AND FLUOROSCOPIC GUIDANCE MEDICATIONS: 2 g Ancef. The antibiotic was given in an appropriate time interval prior to skin puncture. ANESTHESIA/SEDATION: Moderate (conscious) sedation was employed during this procedure. A total of Versed 1.5 mg and Fentanyl 75 mcg was administered intravenously. Moderate Sedation Time: 15 minutes. The patient's level of consciousness and vital signs were monitored continuously by radiology nursing throughout the procedure under my direct supervision. FLUOROSCOPY TIME:  Fluoroscopy Time: 0 minutes 12 seconds (0 mGy). COMPLICATIONS: None immediate. PROCEDURE: Informed written consent was obtained from the patient after a discussion of the risks, benefits, and alternatives to treatment. Questions regarding the procedure were encouraged and answered. The right neck and chest were  prepped with chlorhexidine in a sterile fashion, and a sterile drape was applied covering the operative field. Maximum barrier sterile technique with sterile gowns and gloves were used for the procedure. A timeout was performed prior to the initiation of the procedure. After creating a small venotomy incision, a micropuncture kit was utilized to access the right internal jugular vein under direct, real-time ultrasound guidance after the overlying soft tissues were anesthetized with 1% lidocaine with epinephrine. Ultrasound image documentation was performed. The microwire was kinked to measure appropriate catheter length. A stiff Glidewire was advanced to the level of the IVC and the micropuncture sheath was exchanged for a peel-away sheath. A palindrome tunneled hemodialysis catheter measuring 19 cm from tip to cuff was tunneled in a retrograde fashion from the anterior chest wall to the venotomy incision. The catheter was then placed through the peel-away sheath with tips ultimately positioned within the superior aspect of the right atrium. Final catheter positioning was confirmed and documented with a spot radiographic image. The catheter aspirates and flushes normally. The catheter was flushed with appropriate volume heparin dwells. The catheter  exit site was secured with a 0-Prolene retention suture. The venotomy incision was closed with an interrupted 4-0 Vicryl, Dermabond and Steri-strips. Dressings were applied. The patient tolerated the procedure well without immediate post procedural complication. IMPRESSION: Successful placement of 19 cm tip to cuff tunneled hemodialysis catheter via the right internal jugular vein with tips terminating within the superior aspect of the right atrium. The catheter is ready for immediate use. Electronically Signed   By: Jacqulynn Cadet M.D.   On: 07/25/2018 15:49        Scheduled Meds:  sodium chloride   Intravenous Once   amLODipine  10 mg Oral Daily    cyclophosphamide  50 mg Oral Daily   doxazosin  2 mg Oral Q12H   famotidine  20 mg Oral Daily   folic acid  1 mg Oral Daily   multivitamin with minerals  1 tablet Oral Daily   predniSONE  60 mg Oral Q breakfast   senna-docusate  1 tablet Oral BID   sodium bicarbonate  1,300 mg Oral BID   thiamine  100 mg Oral Daily   Or   thiamine  100 mg Intravenous Daily   Continuous Infusions:   ceFAZolin (ANCEF) IV       LOS: 10 days    Time spent: 35 minutes.     Elmarie Shiley, MD [facility] Pager [phone]  If 7PM-7AM, please contact night-coverage [email or web address] Password [facility] 07/26/2018, 2:05 PM

## 2018-07-26 NOTE — Progress Notes (Signed)
PHARMACY NOTE:  ANTIMICROBIAL RENAL DOSAGE ADJUSTMENT  Current antimicrobial regimen includes a mismatch between antimicrobial dosage and estimated renal function.  As per policy approved by the Pharmacy & Therapeutics and Medical Executive Committees, the antimicrobial dosage will be adjusted accordingly.  Current antimicrobial dosage:  Cefazolin 2g IV q8h  Indication: Surgical Prophylaxis   Renal Function: Scr 5.87 (CrCl ~12.5 mL/min)  Antimicrobial dosage has been changed to:  Cefazolin 2g IV q12h  Please contact pharmacy with any questions.   Thank you for allowing pharmacy to be a part of this patient's care.  Leron Croak, PharmD PGY1 Pharmacy Resident Phone: [phone]  Please check AMION for all [place] phone numbers 07/26/2018 12:12 PM

## 2018-07-27 LAB — CBC
HCT: 21.4 % — ABNORMAL LOW (ref 39.0–52.0)
Hemoglobin: 7.4 g/dL — ABNORMAL LOW (ref 13.0–17.0)
MCH: 30.2 pg (ref 26.0–34.0)
MCHC: 34.6 g/dL (ref 30.0–36.0)
MCV: 87.3 fL (ref 80.0–100.0)
Platelets: 127 10*3/uL — ABNORMAL LOW (ref 150–400)
RBC: 2.45 MIL/uL — ABNORMAL LOW (ref 4.22–5.81)
RDW: 13.2 % (ref 11.5–15.5)
WBC: 6.9 10*3/uL (ref 4.0–10.5)
nRBC: 0 % (ref 0.0–0.2)

## 2018-07-27 LAB — RENAL FUNCTION PANEL
Albumin: 3 g/dL — ABNORMAL LOW (ref 3.5–5.0)
Anion gap: 12 (ref 5–15)
BUN: 116 mg/dL — ABNORMAL HIGH (ref 6–20)
CO2: 24 mmol/L (ref 22–32)
Calcium: 8.9 mg/dL (ref 8.9–10.3)
Chloride: 104 mmol/L (ref 98–111)
Creatinine, Ser: 5.92 mg/dL — ABNORMAL HIGH (ref 0.61–1.24)
GFR calc Af Amer: 11 mL/min — ABNORMAL LOW (ref >60)
GFR calc non Af Amer: 10 mL/min — ABNORMAL LOW (ref >60)
Glucose, Bld: 121 mg/dL — ABNORMAL HIGH (ref 70–99)
Phosphorus: 5.7 mg/dL — ABNORMAL HIGH (ref 2.5–4.6)
Potassium: 4.2 mmol/L (ref 3.5–5.1)
Sodium: 140 mmol/L (ref 135–145)

## 2018-07-27 MED ORDER — DOXAZOSIN MESYLATE 2 MG PO TABS
4.0000 mg | ORAL_TABLET | Freq: Two times a day (BID) | ORAL | Status: DC
  Administered 2018-07-27 – 2018-08-04 (×16): 4 mg via ORAL
  Filled 2018-07-27 (×16): qty 2

## 2018-07-27 MED ORDER — DARBEPOETIN ALFA 200 MCG/0.4ML IJ SOSY
200.0000 ug | PREFILLED_SYRINGE | INTRAMUSCULAR | Status: DC
  Administered 2018-07-27 – 2018-08-03 (×2): 200 ug via SUBCUTANEOUS
  Filled 2018-07-27 (×2): qty 0.4

## 2018-07-27 NOTE — Progress Notes (Signed)
Jeremy Grant   Subjective:   History this is a very pleasant gentleman with stage III chronic kidney disease 58 years of age history of alcohol use and congestive heart failure.  He was admitted with acute kidney injury and a nephritic appearing urine sediment.  His serology revealed an elevated MPO antibody of greater than 100 anti-histone antibody positive and his anti-GBM antibody was negative.  His kappa lambda ratio was also elevated.  Some of his other serologies were negative including an ANA hepatitis B C HIV and ASO.  He underwent successful renal biopsy 07/23/2018.  His hydralazine has been discontinued as there was consideration given to drug-induced vasculitis.  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  Discussed the case with Dr. Anthony Sar at [facility]..  Baseline serum creatinine appears to be about 1.3.  Creatinine on admission was 6.23.  Started Cytoxan 50 mg/dL 07/25/2018.  Blood pressure 160/76 pulse 58 temperature 98 O2 sats 100% room air  Sodium 140 potassium 4.2 chloride 104 CO2 24 BUN 116 creatinine 5.92 glucose 121 phosphorus 5.7 albumin 3.0 calcium 8.9.  WBC 6.9 hemoglobin 7.4 platelets 127   Urine output 3126 cc.  Weight 64.6 kg unchanged   Prednisone 60 mg daily started 07/21/2018, Cytoxan 50 mg daily first dose 07/25/2018 sodium bicarbonate 1.3 g twice daily, Protonix 40 mg daily, folic acid 1 mg daily , multivitamins 1 daily, doxazosin 2 mg twice daily, amlodipine 10 mg daily.  Chest x-ray showed suspected groundglass infiltrate left and right lung with mild cardiomegaly   Objective:  Vital signs in last 24 hours:  Temp:  [97.6 F (36.4 C)-98.4 F (36.9 C)] 98 F (36.7 C) (03/15 0609) Pulse Rate:  [55-62] 58 (03/15 0609) Resp:  [16-17] 17 (03/15 0609) BP: (136-172)/(69-80) 160/76 (03/15 0609) SpO2:  [99 %-100 %] 100 % (03/15 0609) Weight:  [64.6 kg] 64.6 kg (03/14  2137)  Weight change:  Filed Weights   07/23/18 2110 07/24/18 2201 07/26/18 2137  Weight: 63.6 kg 64.4 kg 64.6 kg    Intake/Output: I/O last 3 completed shifts: In: 786 [P.O.:620; IV Piggyback:166] Out: 1610 [Urine:5650; Stool:1]   Intake/Output this shift:  No intake/output data recorded. Alert pleasant gentleman no obvious distress CVS- RRR murmurs rubs or gallops RS- CTA no wheezes or rales ABD- BS present soft non-distended EXT- no edema   Basic Metabolic Panel: Recent Labs  Lab 07/23/18 0353 07/24/18 0432 07/25/18 0421 07/26/18 0752 07/27/18 0317  NA 134* 141 139 138 140  K 4.8 4.2 4.1 4.2 4.2  CL 103 107 103 99 104  CO2 21* '22 23 24 24  '$ GLUCOSE 258* 127* 145* 131* 121*  BUN 137* 131* 127* 119* 116*  CREATININE 6.25* 6.12* 6.00* 5.87* 5.92*  CALCIUM 8.9 9.1 9.0 9.3 8.9  PHOS  --   --  5.6* 5.5* 5.7*    Liver Function Tests: Recent Labs  Lab 07/25/18 0421 07/26/18 0752 07/27/18 0317  ALBUMIN 3.1* 3.4* 3.0*   No results for input(s): LIPASE, AMYLASE in the last 168 hours. No results for input(s): AMMONIA in the last 168 hours.  CBC: Recent Labs  Lab 07/23/18 0353  07/24/18 0432 07/24/18 1505 07/25/18 0744 07/26/18 0752 07/27/18 0317  WBC 6.5  --  5.8  --  5.2 6.6 6.9  NEUTROABS  --   --  4.6  --   --   --   --   HGB 6.4*   < >  7.6* 8.2* 7.5* 8.5* 7.4*  HCT 20.0*   < > 22.1* 24.6* 22.1* 25.4* 21.4*  MCV 89.7  --  86.7  --  87.0 87.6 87.3  PLT 139*  --  126*  --  121* 127* 127*   < > = values in this interval not displayed.    Cardiac Enzymes: No results for input(s): CKTOTAL, CKMB, CKMBINDEX, TROPONINI in the last 168 hours.  BNP: Invalid input(s): POCBNP  CBG: No results for input(s): GLUCAP in the last 168 hours.  Microbiology: Results for orders placed or performed during the hospital encounter of 11/02/13  MRSA PCR Screening     Status: None   Collection Time: 11/02/13 10:31 PM  Result Value Ref Range Status   MRSA by PCR  NEGATIVE NEGATIVE Final    Comment:        The GeneXpert MRSA Assay (FDA approved for NASAL specimens only), is one component of a comprehensive MRSA colonization surveillance program. It is not intended to diagnose MRSA infection nor to guide or monitor treatment for MRSA infections.    Coagulation Studies: No results for input(s): LABPROT, INR in the last 72 hours.  Urinalysis: No results for input(s): COLORURINE, LABSPEC, PHURINE, GLUCOSEU, HGBUR, BILIRUBINUR, KETONESUR, PROTEINUR, UROBILINOGEN, NITRITE, LEUKOCYTESUR in the last 72 hours.  Invalid input(s): APPERANCEUR    Imaging: Ir Fluoro Guide Cv Line Right  Result Date: 07/25/2018 INDICATION: 58 year old male with acute kidney disease in need of hemodialysis. EXAM: TUNNELED CENTRAL VENOUS HEMODIALYSIS CATHETER PLACEMENT WITH ULTRASOUND AND FLUOROSCOPIC GUIDANCE MEDICATIONS: 2 g Ancef. The antibiotic was given in an appropriate time interval prior to skin puncture. ANESTHESIA/SEDATION: Moderate (conscious) sedation was employed during this procedure. A total of Versed 1.5 mg and Fentanyl 75 mcg was administered intravenously. Moderate Sedation Time: 15 minutes. The patient's level of consciousness and vital signs were monitored continuously by radiology nursing throughout the procedure under my direct supervision. FLUOROSCOPY TIME:  Fluoroscopy Time: 0 minutes 12 seconds (0 mGy). COMPLICATIONS: None immediate. PROCEDURE: Informed written consent was obtained from the patient after a discussion of the risks, benefits, and alternatives to treatment. Questions regarding the procedure were encouraged and answered. The right neck and chest were prepped with chlorhexidine in a sterile fashion, and a sterile drape was applied covering the operative field. Maximum barrier sterile technique with sterile gowns and gloves were used for the procedure. A timeout was performed prior to the initiation of the procedure. After creating a small venotomy  incision, a micropuncture kit was utilized to access the right internal jugular vein under direct, real-time ultrasound guidance after the overlying soft tissues were anesthetized with 1% lidocaine with epinephrine. Ultrasound image documentation was performed. The microwire was kinked to measure appropriate catheter length. A stiff Glidewire was advanced to the level of the IVC and the micropuncture sheath was exchanged for a peel-away sheath. A palindrome tunneled hemodialysis catheter measuring 19 cm from tip to cuff was tunneled in a retrograde fashion from the anterior chest wall to the venotomy incision. The catheter was then placed through the peel-away sheath with tips ultimately positioned within the superior aspect of the right atrium. Final catheter positioning was confirmed and documented with a spot radiographic image. The catheter aspirates and flushes normally. The catheter was flushed with appropriate volume heparin dwells. The catheter exit site was secured with a 0-Prolene retention suture. The venotomy incision was closed with an interrupted 4-0 Vicryl, Dermabond and Steri-strips. Dressings were applied. The patient tolerated the procedure well without immediate post procedural  complication. IMPRESSION: Successful placement of 19 cm tip to cuff tunneled hemodialysis catheter via the right internal jugular vein with tips terminating within the superior aspect of the right atrium. The catheter is ready for immediate use. Electronically Signed   By: Jacqulynn Cadet M.D.   On: 07/25/2018 15:49   Ir US Guide Vasc Access Right  Result Date: 07/25/2018 INDICATION: 58 year old male with acute kidney disease in need of hemodialysis. EXAM: TUNNELED CENTRAL VENOUS HEMODIALYSIS CATHETER PLACEMENT WITH ULTRASOUND AND FLUOROSCOPIC GUIDANCE MEDICATIONS: 2 g Ancef. The antibiotic was given in an appropriate time interval prior to skin puncture. ANESTHESIA/SEDATION: Moderate (conscious) sedation was employed  during this procedure. A total of Versed 1.5 mg and Fentanyl 75 mcg was administered intravenously. Moderate Sedation Time: 15 minutes. The patient's level of consciousness and vital signs were monitored continuously by radiology nursing throughout the procedure under my direct supervision. FLUOROSCOPY TIME:  Fluoroscopy Time: 0 minutes 12 seconds (0 mGy). COMPLICATIONS: None immediate. PROCEDURE: Informed written consent was obtained from the patient after a discussion of the risks, benefits, and alternatives to treatment. Questions regarding the procedure were encouraged and answered. The right neck and chest were prepped with chlorhexidine in a sterile fashion, and a sterile drape was applied covering the operative field. Maximum barrier sterile technique with sterile gowns and gloves were used for the procedure. A timeout was performed prior to the initiation of the procedure. After creating a small venotomy incision, a micropuncture kit was utilized to access the right internal jugular vein under direct, real-time ultrasound guidance after the overlying soft tissues were anesthetized with 1% lidocaine with epinephrine. Ultrasound image documentation was performed. The microwire was kinked to measure appropriate catheter length. A stiff Glidewire was advanced to the level of the IVC and the micropuncture sheath was exchanged for a peel-away sheath. A palindrome tunneled hemodialysis catheter measuring 19 cm from tip to cuff was tunneled in a retrograde fashion from the anterior chest wall to the venotomy incision. The catheter was then placed through the peel-away sheath with tips ultimately positioned within the superior aspect of the right atrium. Final catheter positioning was confirmed and documented with a spot radiographic image. The catheter aspirates and flushes normally. The catheter was flushed with appropriate volume heparin dwells. The catheter exit site was secured with a 0-Prolene retention suture.  The venotomy incision was closed with an interrupted 4-0 Vicryl, Dermabond and Steri-strips. Dressings were applied. The patient tolerated the procedure well without immediate post procedural complication. IMPRESSION: Successful placement of 19 cm tip to cuff tunneled hemodialysis catheter via the right internal jugular vein with tips terminating within the superior aspect of the right atrium. The catheter is ready for immediate use. Electronically Signed   By: Jacqulynn Cadet M.D.   On: 07/25/2018 15:49     Medications:   .  ceFAZolin (ANCEF) IV 2 g (07/27/18 8119)   . sodium chloride   Intravenous Once  . amLODipine  10 mg Oral Daily  . cyclophosphamide  50 mg Oral Daily  . doxazosin  2 mg Oral Q12H  . famotidine  20 mg Oral Daily  . folic acid  1 mg Oral Daily  . multivitamin with minerals  1 tablet Oral Daily  . predniSONE  60 mg Oral Q breakfast  . senna-docusate  1 tablet Oral BID  . sodium bicarbonate  1,300 mg Oral BID  . thiamine  100 mg Oral Daily   Or  . 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  IV Solu-Medrol administered 1 g daily x3 doses has transitioned to oral prednisone 07/23/2018  60 mg daily..  Will await renal biopsy results before initiating cytotoxic agents. He does have positive M spike.  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  He is undergone placement of a tunneled dialysis catheter in anticipation of dialysis.  However we will hold off on dialysis as his renal function continues to improve renal biopsy consistent with vasculitis and cellular crescents.  Cytoxan added 07/25/2018 50 mg daily.  This is renally adjusted due to patient having renal failure white count is in the 4 range.  This is a close score that we need to start patient on  hemodialysis.  I would probably give this another day to see what his creatinine does in the next 24 hours.  If there is no improvement then I think it is reasonable to stop hemodialysis treatments  Nonischemic cardiomyopathy last 2D echo was 2017 normal EF.    Anemia.  Hemoglobin 4.7 on admission 3 units packed red blood cells transfused.  Hemoglobin has dropped status post biopsy no blood noted in urine.  Transfused 1 unit packed red blood cells 07/23/2018.  We will darbepoetin 200 mcg weekly  Metabolic acidosis continue sodium bicarbonate.  This seems to have resolved at this time we will continue to follow to make sure patient still needs sodium bicarbonate orally  Hypertension currently using amlodipine 10 mg daily.  We will continue to follow blood pressure appears to be a little better.  He is bradycardic and would not use beta-blocker.  Due to his renal failure I am not sure I would reach for an ACE inhibitor or ARB at this time.  Hydralazine has been implicated in drug-induced vasculitis.  Will increase doxazosin 4 mg twice daily   LOS: [place] '@TODAY''@9'$ :27 AM

## 2018-07-27 NOTE — Progress Notes (Addendum)
PROGRESS NOTE    Jeremy Grant  RFF:[id number] DOB: April 02, 1961 DOA: 07/16/2018 PCP: Hayden Rasmussen, MD    Brief Narrative: 58 year old with past medical history significant for hypertension, ethanol abuse, nonischemic cardiomyopathy had a ejection fraction 20% with grade 3 diastolic dysfunction in 3570, thankfully normalized with normal repeated echo in 2017.  Patient presents with new onset anemia and renal failure.  He was seen by Dr. Delfino Lovett primary care doctor in the office who referred him for admission.  Patient hemoglobin was at 5 and a creatinine at 5. Patient admitted with renal failure and anemia.  Assessment & Plan:   Principal Problem:   Acute kidney failure (HCC) Active Problems:   Malignant hypertension   NICM (nonischemic cardiomyopathy) (HCC)   History of ETOH abuse   Chronic combined systolic and diastolic CHF (congestive heart failure) (HCC)   Normocytic normochromic anemia   Pancytopenia (HCC)   Multiple myeloma (HCC)   Elevated serum protein level   Estimated body mass index is 18.79 kg/m as calculated from the following:   Height as of this encounter: _0  (1.854 m).   Weight as of this encounter: 64.6 kg.  1-AKI, metabolic acidosis:ANCA positive crescent  necrotizing glomerulonephritis. Positive antihistone antibody, low complement we were considering hydralazine induced vasculitis.  Hydralazine has been discontinued. He received  IV Solu-Medrol per nephrology recommendation.  Patient to 60 mg of prednisone daily. He also has an spike on SPEP UPEP.  Skeletal bone survey negative.  Oncology  consulted. He underwent renal biopsy on 3/10. Biopsy; preliminary consistent with POA, ANCA positive crescent  necrotizing glomerulonephritis. Nephrology recommending tunneled dialysis catheter placement.\ Started on cytoxan. Continue with prednisone.  Urine out put ;2.8 L.  Cr 5.9------5.8----6  2-Pancytopenia; Present with a hemoglobin at 4 on admission.  He  received 3 units of packed red blood cell.  He has also received IV iron. SPEP UPEP with spike M. Oncology has been consulted.  Patient underwent Bone Marrow biopsy 3-12. Awaiting Bone marrow biopsy results.   3-Acute blood loss anemia: After biopsy suspect hematoma post biopsy. Received 2 units PRBC 3-11. Hb today at 7.4 Started on aranesp/  He received IV iron. 5 days.   History of nonischemic cardiomyopathy: Normalized by echo 2017.  Monitor. Repeated ECHO with normal EF 3-12.   HTN; resume Norvasc.  Unable to use hydralazine due to concern for hydralazine causing glomerulonephritis.  Started on Cardura.      DVT prophylaxis: SCDs Code Status: Full code Family Communication: Wife at bedside Disposition Plan: Remain in the hospital for blood transfusion and further evaluation of pancytopenia and renal failure  Consultants:   Nephrology  Oncology  IR  Procedures:  Renal biopsy 3/10   Antimicrobials:   None   Subjective: No new complaints. Denies abdominal pain   Objective: Vitals:   07/26/18 0814 07/26/18 1630 07/26/18 2137 07/27/18 0609  BP: (!) 155/75 136/69 (!) 172/80 (!) 160/76  Pulse: 61 62 (!) 55 (!) 58  Resp: _1 Temp: 97.8 F (36.6 C) 97.6 F (36.4 C) 98.4 F (36.9 C) 98 F (36.7 C)  TempSrc: Oral Oral Oral Oral  SpO2: 100% 99% 100% 100%  Weight:   64.6 kg   Height:        Intake/Output Summary (Last 24 hours) at 07/27/2018 1208 Last data filed at 07/27/2018 0900 Gross per 24 hour  Intake 586.01 ml  Output 3476 ml  Net -2889.99 ml   Filed Weights   07/23/18 2110  07/24/18 2201 07/26/18 2137  Weight: 63.6 kg 64.4 kg 64.6 kg    Examination:  General exam: NAD Respiratory system: CTA Cardiovascular system: S 1, S 2 RRR Gastrointestinal system: BS present, soft, nt Central nervous system: Alert, non focal.  Extremities:Symmetric power.   Data Reviewed: I have personally reviewed following labs and imaging  studies  CBC: Recent Labs  Lab 07/23/18 0353  07/24/18 0432 07/24/18 1505 07/25/18 0744 07/26/18 0752 07/27/18 0317  WBC 6.5  --  5.8  --  5.2 6.6 6.9  NEUTROABS  --   --  4.6  --   --   --   --   HGB 6.4*   < > 7.6* 8.2* 7.5* 8.5* 7.4*  HCT 20.0*   < > 22.1* 24.6* 22.1* 25.4* 21.4*  MCV 89.7  --  86.7  --  87.0 87.6 87.3  PLT 139*  --  126*  --  121* 127* 127*   < > = values in this interval not displayed.   Basic Metabolic Panel: Recent Labs  Lab 07/23/18 0353 07/24/18 0432 07/25/18 0421 07/26/18 0752 07/27/18 0317  NA 134* 141 139 138 140  K 4.8 4.2 4.1 4.2 4.2  CL 103 107 103 99 104  CO2 21* _0 GLUCOSE 258* 127* 145* 131* 121*  BUN 137* 131* 127* 119* 116*  CREATININE 6.25* 6.12* 6.00* 5.87* 5.92*  CALCIUM 8.9 9.1 9.0 9.3 8.9  PHOS  --   --  5.6* 5.5* 5.7*   GFR: Estimated Creatinine Clearance: 12.4 mL/min (A) (by C-G formula based on SCr of 5.92 mg/dL (H)). Liver Function Tests: Recent Labs  Lab 07/25/18 0421 07/26/18 0752 07/27/18 0317  ALBUMIN 3.1* 3.4* 3.0*   No results for input(s): LIPASE, AMYLASE in the last 168 hours. No results for input(s): AMMONIA in the last 168 hours. Coagulation Profile: Recent Labs  Lab 07/22/18 0420 07/24/18 0432  INR 1.1 1.3*   Cardiac Enzymes: No results for input(s): CKTOTAL, CKMB, CKMBINDEX, TROPONINI in the last 168 hours. BNP (last 3 results) No results for input(s): PROBNP in the last 8760 hours. HbA1C: No results for input(s): HGBA1C in the last 72 hours. CBG: No results for input(s): GLUCAP in the last 168 hours. Lipid Profile: No results for input(s): CHOL, HDL, LDLCALC, TRIG, CHOLHDL, LDLDIRECT in the last 72 hours. Thyroid Function Tests: No results for input(s): TSH, T4TOTAL, FREET4, T3FREE, THYROIDAB in the last 72 hours. Anemia Panel: No results for input(s): VITAMINB12, FOLATE, FERRITIN, TIBC, IRON, RETICCTPCT in the last 72 hours. Sepsis Labs: No results for input(s): PROCALCITON,  LATICACIDVEN in the last 168 hours.  No results found for this or any previous visit (from the past 240 hour(s)).       Radiology Studies: Ir Fluoro Guide Cv Line Right  Result Date: 07/25/2018 INDICATION: 58 year old male with acute kidney disease in need of hemodialysis. EXAM: TUNNELED CENTRAL VENOUS HEMODIALYSIS CATHETER PLACEMENT WITH ULTRASOUND AND FLUOROSCOPIC GUIDANCE MEDICATIONS: 2 g Ancef. The antibiotic was given in an appropriate time interval prior to skin puncture. ANESTHESIA/SEDATION: Moderate (conscious) sedation was employed during this procedure. A total of Versed 1.5 mg and Fentanyl 75 mcg was administered intravenously. Moderate Sedation Time: 15 minutes. The patient's level of consciousness and vital signs were monitored continuously by radiology nursing throughout the procedure under my direct supervision. FLUOROSCOPY TIME:  Fluoroscopy Time: 0 minutes 12 seconds (0 mGy). COMPLICATIONS: None immediate. PROCEDURE: Informed written consent was obtained from the patient after a discussion of the  risks, benefits, and alternatives to treatment. Questions regarding the procedure were encouraged and answered. The right neck and chest were prepped with chlorhexidine in a sterile fashion, and a sterile drape was applied covering the operative field. Maximum barrier sterile technique with sterile gowns and gloves were used for the procedure. A timeout was performed prior to the initiation of the procedure. After creating a small venotomy incision, a micropuncture kit was utilized to access the right internal jugular vein under direct, real-time ultrasound guidance after the overlying soft tissues were anesthetized with 1% lidocaine with epinephrine. Ultrasound image documentation was performed. The microwire was kinked to measure appropriate catheter length. A stiff Glidewire was advanced to the level of the IVC and the micropuncture sheath was exchanged for a peel-away sheath. A palindrome  tunneled hemodialysis catheter measuring 19 cm from tip to cuff was tunneled in a retrograde fashion from the anterior chest wall to the venotomy incision. The catheter was then placed through the peel-away sheath with tips ultimately positioned within the superior aspect of the right atrium. Final catheter positioning was confirmed and documented with a spot radiographic image. The catheter aspirates and flushes normally. The catheter was flushed with appropriate volume heparin dwells. The catheter exit site was secured with a 0-Prolene retention suture. The venotomy incision was closed with an interrupted 4-0 Vicryl, Dermabond and Steri-strips. Dressings were applied. The patient tolerated the procedure well without immediate post procedural complication. IMPRESSION: Successful placement of 19 cm tip to cuff tunneled hemodialysis catheter via the right internal jugular vein with tips terminating within the superior aspect of the right atrium. The catheter is ready for immediate use. Electronically Signed   By: Jacqulynn Cadet M.D.   On: 07/25/2018 15:49   Ir US Guide Vasc Access Right  Result Date: 07/25/2018 INDICATION: 58 year old male with acute kidney disease in need of hemodialysis. EXAM: TUNNELED CENTRAL VENOUS HEMODIALYSIS CATHETER PLACEMENT WITH ULTRASOUND AND FLUOROSCOPIC GUIDANCE MEDICATIONS: 2 g Ancef. The antibiotic was given in an appropriate time interval prior to skin puncture. ANESTHESIA/SEDATION: Moderate (conscious) sedation was employed during this procedure. A total of Versed 1.5 mg and Fentanyl 75 mcg was administered intravenously. Moderate Sedation Time: 15 minutes. The patient's level of consciousness and vital signs were monitored continuously by radiology nursing throughout the procedure under my direct supervision. FLUOROSCOPY TIME:  Fluoroscopy Time: 0 minutes 12 seconds (0 mGy). COMPLICATIONS: None immediate. PROCEDURE: Informed written consent was obtained from the patient after  a discussion of the risks, benefits, and alternatives to treatment. Questions regarding the procedure were encouraged and answered. The right neck and chest were prepped with chlorhexidine in a sterile fashion, and a sterile drape was applied covering the operative field. Maximum barrier sterile technique with sterile gowns and gloves were used for the procedure. A timeout was performed prior to the initiation of the procedure. After creating a small venotomy incision, a micropuncture kit was utilized to access the right internal jugular vein under direct, real-time ultrasound guidance after the overlying soft tissues were anesthetized with 1% lidocaine with epinephrine. Ultrasound image documentation was performed. The microwire was kinked to measure appropriate catheter length. A stiff Glidewire was advanced to the level of the IVC and the micropuncture sheath was exchanged for a peel-away sheath. A palindrome tunneled hemodialysis catheter measuring 19 cm from tip to cuff was tunneled in a retrograde fashion from the anterior chest wall to the venotomy incision. The catheter was then placed through the peel-away sheath with tips ultimately positioned within the superior  aspect of the right atrium. Final catheter positioning was confirmed and documented with a spot radiographic image. The catheter aspirates and flushes normally. The catheter was flushed with appropriate volume heparin dwells. The catheter exit site was secured with a 0-Prolene retention suture. The venotomy incision was closed with an interrupted 4-0 Vicryl, Dermabond and Steri-strips. Dressings were applied. The patient tolerated the procedure well without immediate post procedural complication. IMPRESSION: Successful placement of 19 cm tip to cuff tunneled hemodialysis catheter via the right internal jugular vein with tips terminating within the superior aspect of the right atrium. The catheter is ready for immediate use. Electronically Signed    By: Jacqulynn Cadet M.D.   On: 07/25/2018 15:49        Scheduled Meds:  sodium chloride   Intravenous Once   amLODipine  10 mg Oral Daily   cyclophosphamide  50 mg Oral Daily   darbepoetin (ARANESP) injection - NON-DIALYSIS  200 mcg Subcutaneous Q Sun-1800   doxazosin  4 mg Oral Q12H   famotidine  20 mg Oral Daily   folic acid  1 mg Oral Daily   multivitamin with minerals  1 tablet Oral Daily   predniSONE  60 mg Oral Q breakfast   senna-docusate  1 tablet Oral BID   sodium bicarbonate  1,300 mg Oral BID   thiamine  100 mg Oral Daily   Or   thiamine  100 mg Intravenous Daily   Continuous Infusions:   ceFAZolin (ANCEF) IV 2 g (07/27/18 0608)     LOS: 11 days    Time spent: 35 minutes.     Elmarie Shiley, MD [facility] Pager [phone]  If 7PM-7AM, please contact night-coverage [email or web address] Password [facility] 07/27/2018, 12:08 PM

## 2018-07-28 LAB — RENAL FUNCTION PANEL
Albumin: 2.9 g/dL — ABNORMAL LOW (ref 3.5–5.0)
Anion gap: 13 (ref 5–15)
BUN: 117 mg/dL — ABNORMAL HIGH (ref 6–20)
CHLORIDE: 100 mmol/L (ref 98–111)
CO2: 25 mmol/L (ref 22–32)
Calcium: 8.7 mg/dL — ABNORMAL LOW (ref 8.9–10.3)
Creatinine, Ser: 6.26 mg/dL — ABNORMAL HIGH (ref 0.61–1.24)
GFR calc Af Amer: 10 mL/min — ABNORMAL LOW (ref >60)
GFR calc non Af Amer: 9 mL/min — ABNORMAL LOW (ref >60)
Glucose, Bld: 178 mg/dL — ABNORMAL HIGH (ref 70–99)
Phosphorus: 5.1 mg/dL — ABNORMAL HIGH (ref 2.5–4.6)
Potassium: 4.3 mmol/L (ref 3.5–5.1)
Sodium: 138 mmol/L (ref 135–145)

## 2018-07-28 LAB — CBC
HCT: 22.5 % — ABNORMAL LOW (ref 39.0–52.0)
Hemoglobin: 7.6 g/dL — ABNORMAL LOW (ref 13.0–17.0)
MCH: 30 pg (ref 26.0–34.0)
MCHC: 33.8 g/dL (ref 30.0–36.0)
MCV: 88.9 fL (ref 80.0–100.0)
Platelets: 115 10*3/uL — ABNORMAL LOW (ref 150–400)
RBC: 2.53 MIL/uL — ABNORMAL LOW (ref 4.22–5.81)
RDW: 13.7 % (ref 11.5–15.5)
WBC: 5.4 10*3/uL (ref 4.0–10.5)
nRBC: 0 % (ref 0.0–0.2)

## 2018-07-28 MED ORDER — SODIUM CHLORIDE 0.9 % IV SOLN
510.0000 mg | Freq: Once | INTRAVENOUS | Status: AC
  Administered 2018-07-28: 510 mg via INTRAVENOUS
  Filled 2018-07-28: qty 17

## 2018-07-28 MED ORDER — CYCLOPHOSPHAMIDE 50 MG PO CAPS
100.0000 mg | ORAL_CAPSULE | Freq: Every day | ORAL | Status: DC
  Administered 2018-07-29 – 2018-08-04 (×7): 100 mg via ORAL
  Filled 2018-07-28 (×7): qty 2

## 2018-07-28 MED ORDER — SODIUM BICARBONATE 650 MG PO TABS
650.0000 mg | ORAL_TABLET | Freq: Two times a day (BID) | ORAL | Status: DC
  Administered 2018-07-28 – 2018-08-04 (×14): 650 mg via ORAL
  Filled 2018-07-28 (×14): qty 1

## 2018-07-28 NOTE — Progress Notes (Signed)
Subjective:  Great UOP- crt only up slightly - he is not uremic Objective Vital signs in last 24 hours: Vitals:   07/26/18 1630 07/26/18 2137 07/27/18 0609 07/28/18 1000  BP: 136/69 (!) 172/80 (!) 160/76 (!) 177/79  Pulse: 62 (!) 55 (!) 58 63  Resp: 16 16 17 18   Temp: 97.6 F (36.4 C) 98.4 F (36.9 C) 98 F (36.7 C) (!) 97.5 F (36.4 C)  TempSrc: Oral Oral Oral Oral  SpO2: 99% 100% 100% 100%  Weight:  64.6 kg    Height:       Weight change:   Intake/Output Summary (Last 24 hours) at 07/28/2018 1323 Last data filed at 07/28/2018 1000 Gross per 24 hour  Intake 1159.87 ml  Output 3000 ml  Net -1840.13 ml    Assessment/ Plan: Pt is a 58 y.o. 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  Reportedly started on prednisone 60 on 3/9 and on cytoxan 50 on 3/13- I will increase to 100 mg daily for a total more like 2 mg per kg that is recommended.  Kidney function has stayed poor but stable and BUN now over 100 but no acute indications for dialysis at this time.  Has not required it but has access  2. Anemia- hgb in the 7's- will likely not increase much in the face of cytoxan- iron is low, needs repletion- ordered feraheme and is already on darbe   3. Elytes- K, calc, phos  and bicarb OK- will decrease sodium bicarb  4. HTN/volume- BP high- now on steroids - good UOP - norvasc and cardura (just added last night) no volume excess, no change for now   [place]: Basic Metabolic Panel: Recent Labs  Lab 07/26/18 0752 07/27/18 0317 07/28/18 0948  NA 138 140 138  K 4.2 4.2 4.3  CL 99 104 100  CO2 24 24 25   GLUCOSE 131* 121* 178*  BUN  119* 116* 117*  CREATININE 5.87* 5.92* 6.26*  CALCIUM 9.3 8.9 8.7*  PHOS 5.5* 5.7* 5.1*   Liver Function Tests: Recent Labs  Lab 07/26/18 0752 07/27/18 0317 07/28/18 0948  ALBUMIN 3.4* 3.0* 2.9*   No results for input(s): LIPASE, AMYLASE in the last 168 hours. No results for input(s): AMMONIA in the last 168 hours. CBC: Recent Labs  Lab 07/24/18 0432  07/25/18 0744 07/26/18 0752 07/27/18 0317 07/28/18 0948  WBC 5.8  --  5.2 6.6 6.9 5.4  NEUTROABS 4.6  --   --   --   --   --   HGB 7.6*   < > 7.5* 8.5* 7.4* 7.6*  HCT 22.1*   < > 22.1* 25.4* 21.4* 22.5*  MCV 86.7  --  87.0 87.6 87.3 88.9  PLT 126*  --  121* 127* 127* 115*   < > = values in this interval not displayed.   Cardiac Enzymes: No results for input(s): CKTOTAL, CKMB, CKMBINDEX, TROPONINI in the last 168 hours. CBG: No results for input(s): GLUCAP in the last 168 hours.  Iron Studies: No results for input(s): IRON, TIBC, TRANSFERRIN, FERRITIN in the last 72 hours. Studies/Results: No results found. Medications: Infusions:  Scheduled Medications: . sodium chloride   Intravenous Once  . amLODipine  10 mg Oral Daily  . cyclophosphamide  50 mg Oral Daily  . darbepoetin (ARANESP) injection - NON-DIALYSIS  200 mcg Subcutaneous Q Sun-1800  . doxazosin  4 mg Oral Q12H  . famotidine  20 mg Oral Daily  . folic acid  1 mg Oral Daily  . multivitamin with minerals  1 tablet Oral Daily  . predniSONE  60 mg Oral Q breakfast  . senna-docusate  1 tablet Oral BID  . sodium bicarbonate  1,300 mg Oral BID  . thiamine  100 mg Oral Daily   Or  . thiamine  100 mg Intravenous Daily    have reviewed scheduled and prn medications.  Physical Exam: General: alert, looks good Heart: RRR Lungs: clear Abdomen: soft, non tender Extremities: no edema Dialysis Access: right ij TDC     07/28/2018,1:23 PM  LOS: 12 days

## 2018-07-28 NOTE — Progress Notes (Signed)
PROGRESS NOTE    Jeremy Grant  UEK:[id number] DOB: [birth year] DOA: 07/16/2018 PCP: Hayden Rasmussen, MD    Brief Narrative: 58 year old with past medical history significant for hypertension, ethanol abuse, nonischemic cardiomyopathy had a ejection fraction 20% with grade 3 diastolic dysfunction in 1505, thankfully normalized with normal repeated echo in 2017.  Patient presents with new onset anemia and renal failure.  He was seen by Dr. Delfino Lovett primary care doctor in the office who referred him for admission.  Patient hemoglobin was at 5 and a creatinine at 5. Patient admitted with renal failure and anemia.  Assessment & Plan:   Principal Problem:   Acute kidney failure (HCC) Active Problems:   Malignant hypertension   NICM (nonischemic cardiomyopathy) (HCC)   History of ETOH abuse   Chronic combined systolic and diastolic CHF (congestive heart failure) (HCC)   Normocytic normochromic anemia   Pancytopenia (HCC)   Multiple myeloma (HCC)   Elevated serum protein level   Estimated body mass index is 18.79 kg/m as calculated from the following:   Height as of this encounter: '6\' 1"'$  (1.854 m).   Weight as of this encounter: 64.6 kg.  1-AKI, metabolic acidosis:ANCA positive crescent  necrotizing glomerulonephritis. Positive antihistone antibody, low complement we were considering hydralazine induced vasculitis.  Hydralazine has been discontinued. He received  IV Solu-Medrol per nephrology recommendation.  Patient to 60 mg of prednisone daily. He also has an spike on SPEP UPEP.  Skeletal bone survey negative.  Oncology  consulted. He underwent renal biopsy on 3/10. Biopsy; preliminary consistent with POA, ANCA positive crescent  necrotizing glomerulonephritis. Nephrology recommending tunneled dialysis catheter placement.\ Started on cytoxan. Continue with prednisone.  Urine out put ;2.8 L.  Cr 5.9------5.8----6---6.2 Cytoxan dose increased today.   2-Pancytopenia; Present with  a hemoglobin at 4 on admission.  He received 3 units of packed red blood cell.  He has also received IV iron. SPEP UPEP with spike M. Oncology has been consulted.  Patient underwent Bone Marrow biopsy 3-12. Awaiting Bone marrow biopsy results.   3-Acute blood loss anemia: After biopsy suspect hematoma post biopsy. Received 2 units PRBC 3-11. Hb 7.4---7.6 Started on aranesp/  He received IV iron. 5 days.   History of nonischemic cardiomyopathy: Normalized by echo 2017.  Monitor. Repeated ECHO with normal EF 3-12.   HTN; resume Norvasc.  Unable to use hydralazine due to concern for hydralazine causing glomerulonephritis.  Started on Cardura.      DVT prophylaxis: SCDs Code Status: Full code Family Communication: Wife at bedside Disposition Plan: Remain in the hospital for blood transfusion and further evaluation of pancytopenia and renal failure  Consultants:   Nephrology  Oncology  IR  Procedures:  Renal biopsy 3/10   Antimicrobials:   None   Subjective: No new complaints.   Objective: Vitals:   07/26/18 1630 07/26/18 2137 07/27/18 0609 07/28/18 1000  BP: 136/69 (!) 172/80 (!) 160/76 (!) 177/79  Pulse: 62 (!) 55 (!) 58 63  Resp: '16 16 17 18  '$ Temp: 97.6 F (36.4 C) 98.4 F (36.9 C) 98 F (36.7 C) (!) 97.5 F (36.4 C)  TempSrc: Oral Oral Oral Oral  SpO2: 99% 100% 100% 100%  Weight:  64.6 kg    Height:        Intake/Output Summary (Last 24 hours) at 07/28/2018 1449 Last data filed at 07/28/2018 1000 Gross per 24 hour  Intake 1159.87 ml  Output 3000 ml  Net -1840.13 ml   Filed Weights   07/23/18  2110 07/24/18 2201 07/26/18 2137  Weight: 63.6 kg 64.4 kg 64.6 kg    Examination:  General exam: NAD Respiratory system: CTA Cardiovascular system: S 1 S 2 RRR Gastrointestinal system: BS present, soft, nt Central nervous system: alert, non focal.  Extremities: Symmetric power.   Data Reviewed: I have personally reviewed following labs and imaging  studies  CBC: Recent Labs  Lab 07/24/18 0432 07/24/18 1505 07/25/18 0744 07/26/18 0752 07/27/18 0317 07/28/18 0948  WBC 5.8  --  5.2 6.6 6.9 5.4  NEUTROABS 4.6  --   --   --   --   --   HGB 7.6* 8.2* 7.5* 8.5* 7.4* 7.6*  HCT 22.1* 24.6* 22.1* 25.4* 21.4* 22.5*  MCV 86.7  --  87.0 87.6 87.3 88.9  PLT 126*  --  121* 127* 127* 185*   Basic Metabolic Panel: Recent Labs  Lab 07/24/18 0432 07/25/18 0421 07/26/18 0752 07/27/18 0317 07/28/18 0948  NA 141 139 138 140 138  K 4.2 4.1 4.2 4.2 4.3  CL 107 103 99 104 100  CO2 '22 23 24 24 25  '$ GLUCOSE 127* 145* 131* 121* 178*  BUN 131* 127* 119* 116* 117*  CREATININE 6.12* 6.00* 5.87* 5.92* 6.26*  CALCIUM 9.1 9.0 9.3 8.9 8.7*  PHOS  --  5.6* 5.5* 5.7* 5.1*   GFR: Estimated Creatinine Clearance: 11.8 mL/min (A) (by C-G formula based on SCr of 6.26 mg/dL (H)). Liver Function Tests: Recent Labs  Lab 07/25/18 0421 07/26/18 0752 07/27/18 0317 07/28/18 0948  ALBUMIN 3.1* 3.4* 3.0* 2.9*   No results for input(s): LIPASE, AMYLASE in the last 168 hours. No results for input(s): AMMONIA in the last 168 hours. Coagulation Profile: Recent Labs  Lab 07/22/18 0420 07/24/18 0432  INR 1.1 1.3*   Cardiac Enzymes: No results for input(s): CKTOTAL, CKMB, CKMBINDEX, TROPONINI in the last 168 hours. BNP (last 3 results) No results for input(s): PROBNP in the last 8760 hours. HbA1C: No results for input(s): HGBA1C in the last 72 hours. CBG: No results for input(s): GLUCAP in the last 168 hours. Lipid Profile: No results for input(s): CHOL, HDL, LDLCALC, TRIG, CHOLHDL, LDLDIRECT in the last 72 hours. Thyroid Function Tests: No results for input(s): TSH, T4TOTAL, FREET4, T3FREE, THYROIDAB in the last 72 hours. Anemia Panel: No results for input(s): VITAMINB12, FOLATE, FERRITIN, TIBC, IRON, RETICCTPCT in the last 72 hours. Sepsis Labs: No results for input(s): PROCALCITON, LATICACIDVEN in the last 168 hours.  No results found for this  or any previous visit (from the past 240 hour(s)).       Radiology Studies: No results found.      Scheduled Meds: . sodium chloride   Intravenous Once  . amLODipine  10 mg Oral Daily  . [START ON 07/29/2018] cyclophosphamide  100 mg Oral Daily  . darbepoetin (ARANESP) injection - NON-DIALYSIS  200 mcg Subcutaneous Q Sun-1800  . doxazosin  4 mg Oral Q12H  . famotidine  20 mg Oral Daily  . folic acid  1 mg Oral Daily  . multivitamin with minerals  1 tablet Oral Daily  . predniSONE  60 mg Oral Q breakfast  . senna-docusate  1 tablet Oral BID  . sodium bicarbonate  650 mg Oral BID  . thiamine  100 mg Oral Daily   Or  . thiamine  100 mg Intravenous Daily   Continuous Infusions: . ferumoxytol       LOS: 12 days    Time spent: 35 minutes.  Elmarie Shiley, MD [facility] Pager [phone]  If 7PM-7AM, please contact night-coverage [email or web address] Password [facility] 07/28/2018, 2:49 PM

## 2018-07-29 LAB — RENAL FUNCTION PANEL
Albumin: 2.9 g/dL — ABNORMAL LOW (ref 3.5–5.0)
Anion gap: 12 (ref 5–15)
BUN: 122 mg/dL — ABNORMAL HIGH (ref 6–20)
CO2: 22 mmol/L (ref 22–32)
Calcium: 8.9 mg/dL (ref 8.9–10.3)
Chloride: 104 mmol/L (ref 98–111)
Creatinine, Ser: 6.89 mg/dL — ABNORMAL HIGH (ref 0.61–1.24)
GFR calc Af Amer: 9 mL/min — ABNORMAL LOW
GFR calc non Af Amer: 8 mL/min — ABNORMAL LOW
Glucose, Bld: 113 mg/dL — ABNORMAL HIGH (ref 70–99)
Phosphorus: 6.1 mg/dL — ABNORMAL HIGH (ref 2.5–4.6)
Potassium: 4 mmol/L (ref 3.5–5.1)
Sodium: 138 mmol/L (ref 135–145)

## 2018-07-29 LAB — CBC
HCT: 23.1 % — ABNORMAL LOW (ref 39.0–52.0)
Hemoglobin: 7.6 g/dL — ABNORMAL LOW (ref 13.0–17.0)
MCH: 29.1 pg (ref 26.0–34.0)
MCHC: 32.9 g/dL (ref 30.0–36.0)
MCV: 88.5 fL (ref 80.0–100.0)
Platelets: 106 10*3/uL — ABNORMAL LOW (ref 150–400)
RBC: 2.61 MIL/uL — ABNORMAL LOW (ref 4.22–5.81)
RDW: 13.7 % (ref 11.5–15.5)
WBC: 5.5 10*3/uL (ref 4.0–10.5)
nRBC: 0 % (ref 0.0–0.2)

## 2018-07-29 MED ORDER — CHLORHEXIDINE GLUCONATE CLOTH 2 % EX PADS
6.0000 | MEDICATED_PAD | Freq: Every day | CUTANEOUS | Status: DC
  Administered 2018-08-01 – 2018-08-03 (×3): 6 via TOPICAL

## 2018-07-29 NOTE — Progress Notes (Signed)
Subjective:  Good UOP- crt only up slightly - he is maybe starting to feel uremic Objective Vital signs in last 24 hours: Vitals:   07/28/18 1000 07/28/18 1700 07/28/18 1830 07/29/18 0447  BP: (!) 177/79 132/80 (!) 150/82 (!) 162/78  Pulse: 63 68 66 65  Resp: 18 18 18 20   Temp: (!) 97.5 F (36.4 C) 97.7 F (36.5 C) 97.7 F (36.5 C) 98 F (36.7 C)  TempSrc: Oral Oral Oral Oral  SpO2: 100% 100% 100% 98%  Weight:   62.4 kg   Height:       Weight change:   Intake/Output Summary (Last 24 hours) at 07/29/2018 1252 Last data filed at 07/29/2018 0900 Gross per 24 hour  Intake 1240 ml  Output 1035 ml  Net 205 ml    Assessment/ Plan: Pt is a 58 y.o. 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  Reportedly started on prednisone 60 on 3/9 and on cytoxan 50 on 3/13- I increased to 100 mg daily for a total more like 2 mg per kg that is recommended.  Kidney function has stayed poor but stable and BUN now over 100 but no acute indications for dialysis at this time.  Numbers worsening slightly daily - I think if numbers not better tomorrow will proceed with HD treatment - he understands 2. Anemia- hgb in the 7's- will likely not increase much in the face of cytoxan- iron is low, needs repletion- ordered feraheme and is already on darbe   3. Elytes- K, calc and bicarb OK-decreased sodium bicarb- phos just up a little- no action yet   4. HTN/volume- BP high- now on steroids - good UOP - norvasc and cardura (just added ) no volume excess, no change for now   [place]: Basic Metabolic Panel: Recent Labs  Lab 07/27/18 0317 07/28/18 0948  07/29/18 0431  NA 140 138 138  K 4.2 4.3 4.0  CL 104 100 104  CO2 24 25 22   GLUCOSE 121* 178* 113*  BUN 116* 117* 122*  CREATININE 5.92* 6.26* 6.89*  CALCIUM 8.9 8.7* 8.9  PHOS 5.7* 5.1* 6.1*   Liver Function Tests: Recent Labs  Lab 07/27/18 0317 07/28/18 0948 07/29/18 0431  ALBUMIN 3.0* 2.9* 2.9*   No results for input(s): LIPASE, AMYLASE in the last 168 hours. No results for input(s): AMMONIA in the last 168 hours. CBC: Recent Labs  Lab 07/24/18 0432  07/25/18 0744 07/26/18 0752 07/27/18 0317 07/28/18 0948 07/29/18 0431  WBC 5.8  --  5.2 6.6 6.9 5.4 5.5  NEUTROABS 4.6  --   --   --   --   --   --   HGB 7.6*   < > 7.5* 8.5* 7.4* 7.6* 7.6*  HCT 22.1*   < > 22.1* 25.4* 21.4* 22.5* 23.1*  MCV 86.7  --  87.0 87.6 87.3 88.9 88.5  PLT 126*  --  121* 127* 127* 115* 106*   < > = values in this interval not displayed.   Cardiac Enzymes: No results for input(s): CKTOTAL, CKMB, CKMBINDEX, TROPONINI in the last 168 hours. CBG: No results for input(s): GLUCAP in the  last 168 hours.  Iron Studies: No results for input(s): IRON, TIBC, TRANSFERRIN, FERRITIN in the last 72 hours. Studies/Results: No results found. Medications: Infusions:   Scheduled Medications: . amLODipine  10 mg Oral Daily  . cyclophosphamide  100 mg Oral Daily  . darbepoetin (ARANESP) injection - NON-DIALYSIS  200 mcg Subcutaneous Q Sun-1800  . doxazosin  4 mg Oral Q12H  . famotidine  20 mg Oral Daily  . folic acid  1 mg Oral Daily  . multivitamin with minerals  1 tablet Oral Daily  . predniSONE  60 mg Oral Q breakfast  . senna-docusate  1 tablet Oral BID  . sodium bicarbonate  650 mg Oral BID  . thiamine  100 mg Oral Daily    have reviewed scheduled and prn medications.  Physical Exam: General: alert, looks good Heart: RRR Lungs: clear Abdomen: soft, non tender Extremities: no edema Dialysis Access: right ij TDC     07/29/2018,12:52 PM  LOS: 13 days

## 2018-07-29 NOTE — Progress Notes (Signed)
PROGRESS NOTE    Jeremy Grant  MRN:[id number] DOB: 01/13/1961 DOA: 07/16/2018 PCP: Richter, Karen L, MD    Brief Narrative: 58-year-old with past medical history significant for hypertension, ethanol abuse, nonischemic cardiomyopathy had a ejection fraction 20% with grade 3 diastolic dysfunction in 2015, thankfully normalized with normal repeated echo in 2017 and repeated echo this admission with normal ejection fraction.  58-year-old presents with new onset anemia and renal failure.  He was seen by Dr. Richard primary care doctor in the office who referred him for admission.  Patient hemoglobin was at 5 and a creatinine at 5. Patient admitted with renal failure and anemia.  He underwent renal biopsy which was consistent with ANCA positive question necrotizing glomerulonephritis.  There was also some concern with hydralazine causing  Glomerulonephritis due to positive antihistone antibody.  He also had mild spike of M protein on SPEP UPEP.  Oncology was consulted and they recommended a bone marrow biopsy.  Call pathology today and hopefully will get the result today.    Assessment & Plan:   Principal Problem:   Acute kidney failure (HCC) Active Problems:   Malignant hypertension   NICM (nonischemic cardiomyopathy) (HCC)   History of ETOH abuse   Chronic combined systolic and diastolic CHF (congestive heart failure) (HCC)   Normocytic normochromic anemia   Pancytopenia (HCC)   Multiple myeloma (HCC)   Elevated serum protein level   Estimated body mass index is 18.15 kg/m as calculated from the following:   Height as of this encounter: 6' 1" (1.854 m).   Weight as of this encounter: 62.4 kg.  1-AKI, metabolic acidosis:ANCA positive crescent  necrotizing glomerulonephritis. Positive antihistone antibody, low complement we were considering hydralazine induced vasculitis.  Hydralazine has been discontinued. He received  IV Solu-Medrol per nephrology recommendation.  Patient to 60 mg of  prednisone daily. He also has an spike on SPEP UPEP.  Skeletal bone survey negative.  Oncology  consulted. He underwent renal biopsy on 3/10. Biopsy; preliminary consistent with POA, ANCA positive crescent  necrotizing glomerulonephritis. Nephrology recommending tunneled dialysis catheter placement.\ Started on cytoxan. Continue with prednisone.  Urine out put ;2.8 L.  Cr 5.9------5.8----6---6.2---6.8. Cytoxan dose increased 3-16 Creatinine continue to increase.  Per nephrology if creatinine continues to increase by tomorrow they will start hemodialysis  2-Pancytopenia; Present with a hemoglobin at 4 on admission.  He received 3 units of packed red blood cell.  He has also received IV iron. SPEP UPEP with spike M. Oncology has been consulted.  Patient underwent Bone Marrow biopsy 3-12. Awaiting Bone marrow biopsy results.   3-Acute blood loss anemia: After biopsy suspect hematoma post biopsy. Received 2 units PRBC 3-11. Hb 7.4---7.6 Started on aranesp/  He received IV iron for 5 days.  Hemoglobin is stable  History of nonischemic cardiomyopathy: Normalized by echo 2017.  Monitor. Repeated ECHO with normal EF 3-12.   HTN; resume Norvasc.  Unable to use hydralazine due to concern for hydralazine causing glomerulonephritis.  Started on Cardura.      DVT prophylaxis: SCDs Code Status: Full code Family Communication: Wife at bedside Disposition Plan: Remain in the hospital for blood transfusion and further evaluation of pancytopenia and renal failure  Consultants:   Nephrology  Oncology  IR  Procedures:  Renal biopsy 3/10   Antimicrobials:   None   Subjective: He denies abdominal pain, denies dyspnea. Objective: Vitals:   07/28/18 1000 07/28/18 1700 07/28/18 1830 07/29/18 0447  BP: (!) 177/79 132/80 (!) 150/82 (!) 162/78    Pulse: 63 68 66 65  Resp: 18 18 18 20  Temp: (!) 97.5 F (36.4 C) 97.7 F (36.5 C) 97.7 F (36.5 C) 98 F (36.7 C)  TempSrc: Oral  Oral Oral Oral  SpO2: 100% 100% 100% 98%  Weight:   62.4 kg   Height:        Intake/Output Summary (Last 24 hours) at 07/29/2018 1341 Last data filed at 07/29/2018 0900 Gross per 24 hour  Intake 940 ml  Output 1035 ml  Net -95 ml   Filed Weights   07/24/18 2201 07/26/18 2137 07/28/18 1830  Weight: 64.4 kg 64.6 kg 62.4 kg    Examination:  General exam: Not acute distress Respiratory system: Clear to auscultation Cardiovascular system: S1, S2 regular rhythm and rate Gastrointestinal system: Bowel sounds present, soft nontender nondistended Central nervous system: Alert nonfocal Extremities: Symmetric  power  Data Reviewed: I have personally reviewed following labs and imaging studies  CBC: Recent Labs  Lab 07/24/18 0432  07/25/18 0744 07/26/18 0752 07/27/18 0317 07/28/18 0948 07/29/18 0431  WBC 5.8  --  5.2 6.6 6.9 5.4 5.5  NEUTROABS 4.6  --   --   --   --   --   --   HGB 7.6*   < > 7.5* 8.5* 7.4* 7.6* 7.6*  HCT 22.1*   < > 22.1* 25.4* 21.4* 22.5* 23.1*  MCV 86.7  --  87.0 87.6 87.3 88.9 88.5  PLT 126*  --  121* 127* 127* 115* 106*   < > = values in this interval not displayed.   Basic Metabolic Panel: Recent Labs  Lab 07/25/18 0421 07/26/18 0752 07/27/18 0317 07/28/18 0948 07/29/18 0431  NA 139 138 140 138 138  K 4.1 4.2 4.2 4.3 4.0  CL 103 99 104 100 104  CO2 23 24 24 25 22  GLUCOSE 145* 131* 121* 178* 113*  BUN 127* 119* 116* 117* 122*  CREATININE 6.00* 5.87* 5.92* 6.26* 6.89*  CALCIUM 9.0 9.3 8.9 8.7* 8.9  PHOS 5.6* 5.5* 5.7* 5.1* 6.1*   GFR: Estimated Creatinine Clearance: 10.3 mL/min (A) (by C-G formula based on SCr of 6.89 mg/dL (H)). Liver Function Tests: Recent Labs  Lab 07/25/18 0421 07/26/18 0752 07/27/18 0317 07/28/18 0948 07/29/18 0431  ALBUMIN 3.1* 3.4* 3.0* 2.9* 2.9*   No results for input(s): LIPASE, AMYLASE in the last 168 hours. No results for input(s): AMMONIA in the last 168 hours. Coagulation Profile: Recent Labs  Lab  07/24/18 0432  INR 1.3*   Cardiac Enzymes: No results for input(s): CKTOTAL, CKMB, CKMBINDEX, TROPONINI in the last 168 hours. BNP (last 3 results) No results for input(s): PROBNP in the last 8760 hours. HbA1C: No results for input(s): HGBA1C in the last 72 hours. CBG: No results for input(s): GLUCAP in the last 168 hours. Lipid Profile: No results for input(s): CHOL, HDL, LDLCALC, TRIG, CHOLHDL, LDLDIRECT in the last 72 hours. Thyroid Function Tests: No results for input(s): TSH, T4TOTAL, FREET4, T3FREE, THYROIDAB in the last 72 hours. Anemia Panel: No results for input(s): VITAMINB12, FOLATE, FERRITIN, TIBC, IRON, RETICCTPCT in the last 72 hours. Sepsis Labs: No results for input(s): PROCALCITON, LATICACIDVEN in the last 168 hours.  No results found for this or any previous visit (from the past 240 hour(s)).       Radiology Studies: No results found.      Scheduled Meds: . amLODipine  10 mg Oral Daily  . [START ON 07/30/2018] Chlorhexidine Gluconate Cloth  6 each Topical Q0600  .   cyclophosphamide  100 mg Oral Daily  . darbepoetin (ARANESP) injection - NON-DIALYSIS  200 mcg Subcutaneous Q Sun-1800  . doxazosin  4 mg Oral Q12H  . famotidine  20 mg Oral Daily  . folic acid  1 mg Oral Daily  . multivitamin with minerals  1 tablet Oral Daily  . predniSONE  60 mg Oral Q breakfast  . senna-docusate  1 tablet Oral BID  . sodium bicarbonate  650 mg Oral BID  . thiamine  100 mg Oral Daily   Continuous Infusions:    LOS: 13 days    Time spent: 35 minutes.      A , MD [facility] Pager [phone]  If 7PM-7AM, please contact night-coverage [email or web address] Password [email or web address] 07/29/2018, 1:41 PM  

## 2018-07-30 LAB — RENAL FUNCTION PANEL
Albumin: 2.8 g/dL — ABNORMAL LOW (ref 3.5–5.0)
Anion gap: 12 (ref 5–15)
BUN: 123 mg/dL — ABNORMAL HIGH (ref 6–20)
CO2: 23 mmol/L (ref 22–32)
Calcium: 8.7 mg/dL — ABNORMAL LOW (ref 8.9–10.3)
Chloride: 102 mmol/L (ref 98–111)
Creatinine, Ser: 6.91 mg/dL — ABNORMAL HIGH (ref 0.61–1.24)
GFR calc Af Amer: 9 mL/min — ABNORMAL LOW (ref >60)
GFR calc non Af Amer: 8 mL/min — ABNORMAL LOW (ref >60)
Glucose, Bld: 136 mg/dL — ABNORMAL HIGH (ref 70–99)
Phosphorus: 6 mg/dL — ABNORMAL HIGH (ref 2.5–4.6)
Potassium: 4 mmol/L (ref 3.5–5.1)
Sodium: 137 mmol/L (ref 135–145)

## 2018-07-30 MED ORDER — HEPARIN SODIUM (PORCINE) 1000 UNIT/ML IJ SOLN
INTRAMUSCULAR | Status: AC
  Filled 2018-07-30: qty 4

## 2018-07-30 MED ORDER — RENA-VITE PO TABS
1.0000 | ORAL_TABLET | Freq: Every day | ORAL | Status: DC
  Administered 2018-07-30 – 2018-08-03 (×5): 1 via ORAL
  Filled 2018-07-30 (×6): qty 1

## 2018-07-30 MED ORDER — CLONIDINE HCL 0.1 MG PO TABS
0.1000 mg | ORAL_TABLET | Freq: Once | ORAL | Status: AC
  Administered 2018-07-30: 0.1 mg via ORAL
  Filled 2018-07-30: qty 1

## 2018-07-30 MED ORDER — SPIRONOLACTONE 12.5 MG HALF TABLET
12.5000 mg | ORAL_TABLET | Freq: Two times a day (BID) | ORAL | Status: DC
  Administered 2018-07-30 – 2018-08-01 (×4): 12.5 mg via ORAL
  Filled 2018-07-30 (×5): qty 1

## 2018-07-30 MED ORDER — HEPARIN SODIUM (PORCINE) 1000 UNIT/ML IJ SOLN
3.2000 mL | Freq: Once | INTRAMUSCULAR | Status: AC
  Administered 2018-07-30: 3200 [IU] via INTRAVENOUS

## 2018-07-30 NOTE — Progress Notes (Signed)
Initial Nutrition Assessment  DOCUMENTATION CODES:   Underweight  INTERVENTION:   -Renal MVI daily -D/c MVI with minerals daily -Double protein with all meals  NUTRITION DIAGNOSIS:   Increased nutrient needs related to acute illness(renal failure) as evidenced by estimated needs.  GOAL:   Patient will meet greater than or equal to 90% of their needs  MONITOR:   PO intake, Supplement acceptance, Diet advancement, Labs, Weight trends, Skin, I & O's  REASON FOR ASSESSMENT:   LOS    ASSESSMENT:   Jeremy Grant is a 58 y.o. male with medical history significant of HTN, EtOH abuse, NICM had EF 20% with grade 3 diastolic dysfunction back in 2015, this thankfully normalized with normal repeat echos in 2015 and 2017. Patient presents today with new onset anemia and renal failure.  He was seen by Dr. Darron Doom, primary care physician, in her office yesterday.  Pt admitted with severe AKI.  3/10- s/p renal biopsy (consistent with vasculitis with cellular crescents and minimal scar tissue) 3/12- s/p CT bone marrow biopsy and aspiration (to rule out multiple myeloma) 3/13- s/p rt HD catheter placement 3/17- cytoxan initiated  Reviewed I/O's: -710 ml x 24 hours and -22.1 L since admission  UOP: 1.9 L x 24 hours  Per oncology notes, pt awaiting bone marrow biopsy results.   Pt down in HD at time of visit due to electrolyte imbalances.   Spoke with pt wife, who was present in room at time of visit. She reports that pt has always had a great appetite ("He loves to eat and always has"). PTA pt was consuming 3 meals per day (Breakfast: breakfast sandwich consisting of two pieces of toast, homemade chicken sausage or chicken bacon, and 2-3 scrambled eggs; Lunch: leftovers; Dinner: meat, starch, and vegetable). Pt wife shares that they "try to eat clean" and [place], where they grow and consume most of their vegetables and purchase grass-fed meats. Since being in the  hospital, he has been generally consuming 100% of meals, with the exception of today's lunch (consumed 1/3 of chicken breast), as he did not like the taste of what was served. Pt wife has also been bringing in outside food, such as a large bowl of spaghetti and food from [employer]. Pt's largest complaint is that he is not being served enough food.   Pt wife describes pt as a very active person. He has always been tall and slender (also confirmed by photos that pt wife shared with this RD). His UBW is around 158#. She reports pt has lost about 12# within the past month, which started concerning pt at his last PCP visit 3 weeks ago. Pt also complained of fatigue, which he was contributing to increased hours at work.   Discussed with pt wife importance of good nutritional intake (especially protein) to promote healing and preserve lean body mass. Wife reports pt likely would not take snacks or supplements related to food preferences, however, is amenable to double protein portions at meals.   Medications reviewed and include prednisone  Labs reviewed: K WDL, Phos: 6.0.    NUTRITION - FOCUSED PHYSICAL EXAM:    Most Recent Value  Orbital Region  Unable to assess  Upper Arm Region  Unable to assess  Thoracic and Lumbar Region  Unable to assess  Buccal Region  Unable to assess  Temple Region  Unable to assess  Clavicle Bone Region  Unable to assess  Clavicle and Acromion Bone Region  Unable to  assess  Scapular Bone Region  Unable to assess  Dorsal Hand  Unable to assess  Patellar Region  Unable to assess  Anterior Thigh Region  Unable to assess  Posterior Calf Region  Unable to assess  Edema (RD Assessment)  Unable to assess  Hair  Unable to assess  Eyes  Unable to assess  Mouth  Unable to assess  Skin  Unable to assess  Nails  Unable to assess       Diet Order:   Diet Order            Diet renal with fluid restriction Fluid restriction: 1200 mL Fluid; Room service appropriate? Yes; Fluid  consistency: Thin  Diet effective now              EDUCATION NEEDS:   Education needs have been addressed  Skin:  Skin Assessment: Reviewed RN Assessment  Last BM:  07/29/18  Height:   Ht Readings from Last 1 Encounters:  07/23/18 _0  (1.854 m)    Weight:   Wt Readings from Last 1 Encounters:  07/30/18 61.6 kg    Ideal Body Weight:  83.6 kg  BMI:  Body mass index is 17.92 kg/m.  Estimated Nutritional Needs:   Kcal:  1850-2050  Protein:  95-110 grams  Fluid:  1000 ml + UOP    Marielis Samara A. Jimmye Norman, RD, LDN, [place] Registered Dietitian II Certified Diabetes Care and Education Specialist Pager: [phone] After hours Pager: [phone]

## 2018-07-30 NOTE — Progress Notes (Signed)
PROGRESS NOTE    Jeremy Grant  QPY:[id number] DOB: 1961-03-06 DOA: 07/16/2018 PCP: Hayden Rasmussen, MD    Brief Narrative:  58 year old with past medical history significant for hypertension, ethanol abuse, nonischemic cardiomyopathy, had a ejection fraction 20% with grade 3 diastolic dysfunction in 1245, thankfully normalized with normal repeated echo in 2017 and repeated echo this admission with normal ejection fraction presented with new onset anemia and acute kidney injury. He was seen by his PCP in the office who referred him for admission. Patient hemoglobin was at 5 and a creatinine at 5.  He was admitted for Acute kidney injury and anemia.  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  There was also some concern with hydralazine causing Glomerulonephritis due to positive antihistone antibody. He also had mild spike of M protein on SPEP UPEP.  Oncology was consulted and underwent a bone marrow biopsy  Assessment & Plan:   Principal Problem:   Acute kidney failure ([place]) Active Problems:   Malignant hypertension   NICM (nonischemic cardiomyopathy) (HCC)   History of ETOH abuse   Chronic combined systolic and diastolic CHF (congestive heart failure) (HCC)   Normocytic normochromic anemia   Pancytopenia (HCC)   Multiple myeloma (HCC)   Elevated serum protein level   Estimated body mass index is 17.92 kg/m as calculated from the following:   Height as of this encounter: _0  (1.854 m).   Weight as of this encounter: 61.6 kg.  Acute kidney injury Creatinine 1.32 in December 2017.  Presented with creatinine of 6.23. Nephrology consulted and assisted with evaluation and management. Renal biopsy on 3/11 showed vasculitis (POA, ANCA positive crescent  necrotizing glomerulonephritis) - antihistone positive (was on hydralazine) and MPO positive. Hydralazine has been discontinued. He received  IV Solu-Medrol that was changed to  60 mg of prednisone daily. He also has an M spike on SPEP UPEP.  Skeletal bone survey negative.  Oncology  consulted. S/P tunneled dialysis catheter placement. Started on cytoxan along with prednisone.  Urine out put good 1.8 L Cr steadily increasing for the last 3 days 6.26 > 6.89 > 6.9 As per nephrology follow-up, receiving HD 3/18 and then as needed.  Pancytopenia Present with a hemoglobin at 4 on admission.  He received 3 units of packed red blood cell.  He has also received IV iron. SPEP UPEP with spike M. Oncology was consulted.  Patient underwent Bone Marrow biopsy 3/12 which showed mildly hypercellular marrow with trilineage hematopoiesis and polytypic plasmacytosis.  The findings of the marrow are nonspecific and there is no definitive features to suggest hemopoietic neoplasm, correlation with cytogenetics was recommended. As per oncology follow-up, no intervention needed from an oncologic standpoint at this time and plan to see him as outpatient. Leukopenia resolved. Thrombocytopenia slowly worsening.   Acute blood loss anemia:  After biopsy suspect hematoma post biopsy. Received 2 units PRBC 3-11. Hb stable in the mid 7 g range for the last several days. Started on aranesp/  He received IV iron for 5 days.   History of nonischemic cardiomyopathy:  Normalized by echo 2017.  Monitor. Repeated ECHO with normal EF 3-12.  HTN;  resume Norvasc & Doxazosin.  Unable to use hydralazine due to concern for hydralazine causing glomerulonephritis.  Mildly uncontrolled. Unable to add ACEI/ARB/BB/Clonidine d/t bradycardia.  Nephrology added Aldactone.     DVT prophylaxis: SCDs Code Status: Full code Family Communication: Wife at bedside Disposition Plan: Remain in the hospital for blood  transfusion and further evaluation of pancytopenia and renal failure  Consultants:   Nephrology  Oncology  IR  Procedures:  Renal biopsy 3/10   Antimicrobials:   None    Subjective: Seen this am prior to HD. Denied complaints. No pain, nausea or vomiting reported.  Objective: Vitals:   07/30/18 0900 07/30/18 1340 07/30/18 1356 07/30/18 1401  BP:  (!) 155/82 (!) 150/80 (!) 151/82  Pulse: 76 66 66 66  Resp:  _0 Temp:  98 F (36.7 C)    TempSrc: Oral Oral    SpO2: 100% 99%    Weight:  61.6 kg    Height:        Intake/Output Summary (Last 24 hours) at 07/30/2018 1539 Last data filed at 07/30/2018 0900 Gross per 24 hour  Intake 600 ml  Output 1275 ml  Net -675 ml   Filed Weights   07/28/18 1830 07/29/18 2138 07/30/18 1340  Weight: 62.4 kg 62.5 kg 61.6 kg    Examination:  General exam: Pleasant you male lying comfortably supine in bed. Respiratory system: Clear to auscultation. No increased work of breathing. Cardiovascular system: S1, S2 regular rhythm and rate. No JVD or pedal edema.  Gastrointestinal system: Bowel sounds present, soft nontender nondistended.  Stable  Central nervous system: Alert.  No focal neurological deficits.  No asterixis. Extremities: Symmetric 5 x 5 power.  Data Reviewed: I have personally reviewed following labs and imaging studies  CBC: Recent Labs  Lab 07/24/18 0432  07/25/18 0744 07/26/18 0752 07/27/18 0317 07/28/18 0948 07/29/18 0431  WBC 5.8  --  5.2 6.6 6.9 5.4 5.5  NEUTROABS 4.6  --   --   --   --   --   --   HGB 7.6*   < > 7.5* 8.5* 7.4* 7.6* 7.6*  HCT 22.1*   < > 22.1* 25.4* 21.4* 22.5* 23.1*  MCV 86.7  --  87.0 87.6 87.3 88.9 88.5  PLT 126*  --  121* 127* 127* 115* 106*   < > = values in this interval not displayed.   Basic Metabolic Panel: Recent Labs  Lab 07/26/18 0752 07/27/18 0317 07/28/18 0948 07/29/18 0431 07/30/18 0524  NA 138 140 138 138 137  K 4.2 4.2 4.3 4.0 4.0  CL 99 104 100 104 102  CO2 _1 GLUCOSE 131* 121* 178* 113* 136*  BUN 119* 116* 117* 122* 123*  CREATININE 5.87* 5.92* 6.26* 6.89* 6.91*  CALCIUM 9.3 8.9 8.7* 8.9 8.7*  PHOS 5.5* 5.7* 5.1* 6.1*  6.0*   GFR: Estimated Creatinine Clearance: 10.2 mL/min (A) (by C-G formula based on SCr of 6.91 mg/dL (H)). Liver Function Tests: Recent Labs  Lab 07/26/18 0752 07/27/18 0317 07/28/18 0948 07/29/18 0431 07/30/18 0524  ALBUMIN 3.4* 3.0* 2.9* 2.9* 2.8*   Coagulation Profile: Recent Labs  Lab 07/24/18 0432  INR 1.3*    Radiology Studies: No results found.      Scheduled Meds: . amLODipine  10 mg Oral Daily  . Chlorhexidine Gluconate Cloth  6 each Topical Q0600  . cyclophosphamide  100 mg Oral Daily  . darbepoetin (ARANESP) injection - NON-DIALYSIS  200 mcg Subcutaneous Q Sun-1800  . doxazosin  4 mg Oral Q12H  . famotidine  20 mg Oral Daily  . folic acid  1 mg Oral Daily  . multivitamin  1 tablet Oral QHS  . predniSONE  60 mg Oral Q breakfast  . senna-docusate  1 tablet Oral BID  .  sodium bicarbonate  650 mg Oral BID  . spironolactone  12.5 mg Oral BID  . thiamine  100 mg Oral Daily   Continuous Infusions:    LOS: 14 days   Vernell Leep, MD, FACP, [facility]. [facility]  To contact the attending provider between 7A-7P or the covering provider during after hours 7P-7A, please log into the web site [email or web address] and access using universal [place] password for that web site. If you do not have the password, please call the hospital operator.

## 2018-07-30 NOTE — Progress Notes (Signed)
Stopped by to see patient to discuss bone marrow biopsy. He was in dialysis. Wife was in the room and stated that he had already been given results. I will plan to stop by and see him later today or tomorrow to discuss results. No intervention needed from an oncology standpoint at this time. Will plan to see him as an outpatient.   Mikey Bussing, DNP, AGPCNP-BC, AOCNP

## 2018-07-30 NOTE — Progress Notes (Signed)
Subjective:  Good UOP- 1800 - crt kind of steady - he is maybe starting to feel uremic Objective Vital signs in last 24 hours: Vitals:   07/29/18 2216 07/30/18 0100 07/30/18 0728 07/30/18 0900  BP: (!) 182/84 (!) 180/78 (!) 170/76   Pulse: 68 68  76  Resp: 18 16    Temp: 97.8 F (36.6 C) 97.9 F (36.6 C)    TempSrc: Oral Oral  Oral  SpO2: 98% 98%  100%  Weight:      Height:       Weight change: 0.06 kg  Intake/Output Summary (Last 24 hours) at 07/30/2018 1116 Last data filed at 07/30/2018 0900 Gross per 24 hour  Intake 900 ml  Output 1675 ml  Net -775 ml    Assessment/ Plan: Pt is a 58 y.o. 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  Reportedly started on prednisone 60 on 3/9 and on cytoxan 50 on 3/13- I increased to 100 mg daily on 3/16 for a total more like 2 mg per kg that is recommended.  Kidney function has stayed poor but relatively stable - BUN well over 100 - no acute indications for dialysis but really may be feeling the effects.  I had talked to him yesterday about doing an HD treatment to decrease those toxins for support and so will proceed today, then do PRN 2. Anemia- hgb in the 7's- will likely not increase much in the face of cytoxan- iron is low, needs repletion- ordered feraheme and is already on darbe   3. Elytes- K, calc and bicarb OK-decreased sodium bicarb supplement- phos just up a little- no action yet - HD will help correct in the short term  4. HTN/volume- BP high- now on steroids - good UOP - norvasc and cardura (just added ) no volume excess, given clonidine PRN.  Not sure what to add, pulse is low limiting beta blocker or  clonidine- obviously not ACE or ARB or hydralazine- will try aldactone   [name]    Labs: Basic Metabolic Panel: Recent Labs  Lab 07/28/18 0948 07/29/18 0431 07/30/18 0524  NA 138 138 137  K 4.3 4.0 4.0  CL 100 104 102  CO2 25 22 23   GLUCOSE 178* 113* 136*  BUN 117* 122* 123*  CREATININE 6.26* 6.89* 6.91*  CALCIUM 8.7* 8.9 8.7*  PHOS 5.1* 6.1* 6.0*   Liver Function Tests: Recent Labs  Lab 07/28/18 0948 07/29/18 0431 07/30/18 0524  ALBUMIN 2.9* 2.9* 2.8*   No results for input(s): LIPASE, AMYLASE in the last 168 hours. No results for input(s): AMMONIA in the last 168 hours. CBC: Recent Labs  Lab 07/24/18 0432  07/25/18 0744 07/26/18 0752 07/27/18 0317 07/28/18 0948 07/29/18 0431  WBC 5.8  --  5.2 6.6 6.9 5.4 5.5  NEUTROABS 4.6  --   --   --   --   --   --   HGB 7.6*   < > 7.5* 8.5* 7.4* 7.6* 7.6*  HCT 22.1*   < > 22.1* 25.4* 21.4* 22.5* 23.1*  MCV 86.7  --  87.0 87.6 87.3 88.9 88.5  PLT 126*  --  121* 127*  127* 115* 106*   < > = values in this interval not displayed.   Cardiac Enzymes: No results for input(s): CKTOTAL, CKMB, CKMBINDEX, TROPONINI in the last 168 hours. CBG: No results for input(s): GLUCAP in the last 168 hours.  Iron Studies: No results for input(s): IRON, TIBC, TRANSFERRIN, FERRITIN in the last 72 hours. Studies/Results: No results found. Medications: Infusions:   Scheduled Medications: . amLODipine  10 mg Oral Daily  . Chlorhexidine Gluconate Cloth  6 each Topical Q0600  . cyclophosphamide  100 mg Oral Daily  . darbepoetin (ARANESP) injection - NON-DIALYSIS  200 mcg Subcutaneous Q Sun-1800  . doxazosin  4 mg Oral Q12H  . famotidine  20 mg Oral Daily  . folic acid  1 mg Oral Daily  . multivitamin with minerals  1 tablet Oral Daily  . predniSONE  60 mg Oral Q breakfast  . senna-docusate  1 tablet Oral BID  . sodium bicarbonate  650 mg Oral BID  . thiamine  100 mg Oral Daily    have reviewed scheduled and prn  medications.  Physical Exam: General: alert, looks good Heart: RRR Lungs: clear Abdomen: soft, non tender Extremities: no edema Dialysis Access: right ij TDC     07/30/2018,11:16 AM  LOS: 14 days

## 2018-07-31 ENCOUNTER — Telehealth: Payer: Self-pay | Admitting: Internal Medicine

## 2018-07-31 ENCOUNTER — Other Ambulatory Visit: Payer: Self-pay | Admitting: Oncology

## 2018-07-31 DIAGNOSIS — E440 Moderate protein-calorie malnutrition: Secondary | ICD-10-CM

## 2018-07-31 DIAGNOSIS — D61818 Other pancytopenia: Secondary | ICD-10-CM

## 2018-07-31 DIAGNOSIS — N179 Acute kidney failure, unspecified: Secondary | ICD-10-CM

## 2018-07-31 DIAGNOSIS — R779 Abnormality of plasma protein, unspecified: Secondary | ICD-10-CM

## 2018-07-31 LAB — RENAL FUNCTION PANEL
Albumin: 2.7 g/dL — ABNORMAL LOW (ref 3.5–5.0)
Anion gap: 12 (ref 5–15)
BUN: 83 mg/dL — ABNORMAL HIGH (ref 6–20)
CO2: 25 mmol/L (ref 22–32)
Calcium: 8.8 mg/dL — ABNORMAL LOW (ref 8.9–10.3)
Chloride: 102 mmol/L (ref 98–111)
Creatinine, Ser: 5.35 mg/dL — ABNORMAL HIGH (ref 0.61–1.24)
GFR calc Af Amer: 13 mL/min — ABNORMAL LOW (ref >60)
GFR calc non Af Amer: 11 mL/min — ABNORMAL LOW (ref >60)
Glucose, Bld: 138 mg/dL — ABNORMAL HIGH (ref 70–99)
Phosphorus: 5.7 mg/dL — ABNORMAL HIGH (ref 2.5–4.6)
Potassium: 3.9 mmol/L (ref 3.5–5.1)
Sodium: 139 mmol/L (ref 135–145)

## 2018-07-31 NOTE — Progress Notes (Signed)
Nutrition Follow-up  DOCUMENTATION CODES:   Non-severe (moderate) malnutrition in context of acute illness/injury  INTERVENTION:   -Continue renal MVI daily -Continue double protein portions with meals  NUTRITION DIAGNOSIS:   Moderate Malnutrition related to acute illness(acute kidney injury) as evidenced by mild fat depletion, mild muscle depletion, percent weight loss.  Ongoing  GOAL:   Patient will meet greater than or equal to 90% of their needs  Progressing  MONITOR:   PO intake, Supplement acceptance, Labs, Weight trends, Skin, I & O's  REASON FOR ASSESSMENT:   LOS    ASSESSMENT:   Jeremy Grant is a 58 y.o. male with medical history significant of HTN, EtOH abuse, NICM had EF 20% with grade 3 diastolic dysfunction back in 2015, this thankfully normalized with normal repeat echos in 2015 and 2017. Patient presents today with new onset anemia and renal failure.  He was seen by Dr. Darron Doom, primary care physician, in her office yesterday.  3/10- s/p renal biopsy (consistent with vasculitis with cellular crescents and minimal scar tissue) 3/12- s/p CT bone marrow biopsy and aspiration (to rule out multiple myeloma) 3/13- s/p rt HD catheter placement 3/17- cytoxan initiated 3/18- first HD session  Reviewed I/O's: -1.2 L x 24 hours and -22.6 L since 07/17/18  UOP: 1.5 L x 24 hours  Per nephrologist (who was present in room at time of visit), HD worked well yesterday. No plans to further pursue HD at this time, but will continue to monitor.   Spoke with pt, who was in good spirits today. He reports sleeping well and feeling better after HD yesterday. He reports continued good appetite; "I've always been a good eater; my problem is there will never be enough of it. You don't need to worry about me". He reports his wife informed him of this RD's visit yesterday and was able to confirm history obtained yesterday (per pt and wife report, experienced approximately a 7.6%  wt loss x 1 month, which is significant for time frame, however, no documented wt hx to confirm this). RD informed pt of continued plan to provide double protein portion at meals to assist with satiety and help preserve lean body mass. Pt receptive to plan and appreciative of RD assistance.   Medications reviewed and include prednisone, thiamine, and folic acid.   Labs reviewed: Phos: 5.7, K WDL.   NUTRITION - FOCUSED PHYSICAL EXAM:    Most Recent Value  Orbital Region  Mild depletion  Upper Arm Region  Mild depletion  Thoracic and Lumbar Region  Mild depletion  Buccal Region  No depletion  Temple Region  Mild depletion  Clavicle Bone Region  Mild depletion  Clavicle and Acromion Bone Region  Mild depletion  Scapular Bone Region  Mild depletion  Dorsal Hand  No depletion  Patellar Region  No depletion  Anterior Thigh Region  No depletion  Posterior Calf Region  No depletion  Edema (RD Assessment)  Mild  Hair  Reviewed  Eyes  Reviewed  Mouth  Reviewed  Skin  Reviewed  Nails  Reviewed       Diet Order:   Diet Order            Diet renal with fluid restriction Fluid restriction: 1200 mL Fluid; Room service appropriate? Yes; Fluid consistency: Thin  Diet effective now              EDUCATION NEEDS:   Education needs have been addressed  Skin:  Skin Assessment: Reviewed RN Assessment  Last BM:  07/30/18  Height:   Ht Readings from Last 1 Encounters:  07/23/18 '6\' 1"'$  (1.854 m)    Weight:   Wt Readings from Last 1 Encounters:  07/30/18 61.6 kg    Ideal Body Weight:  83.6 kg  BMI:  Body mass index is 17.92 kg/m.  Estimated Nutritional Needs:   Kcal:  1850-2050  Protein:  95-110 grams  Fluid:  1000 ml + UOP    Jeremy Grant A. Jimmye Norman, RD, LDN, [place] Registered Dietitian II Certified Diabetes Care and Education Specialist Pager: [phone] After hours Pager: [phone]

## 2018-07-31 NOTE — Progress Notes (Signed)
Spoke with patient regarding his bone marrow biopsy results.  We discussed that there was no evidence of multiple myeloma or anything else concerning from a hematology standpoint.  At this point in time, there is no treatment recommended from [place].  We will plan to see the patient back as an outpatient in 4 to 6 weeks to recheck a myeloma panel.  I have sent a scheduling message to the cancer center to arrange for this appointment.  The patient will be contacted with the date and time.  Mikey Bussing, DNP, AGPCNP-BC, AOCNP

## 2018-07-31 NOTE — Progress Notes (Signed)
Subjective:  Dialysis yest- 2 hours tolerated well- had 1500 of UOP  Objective Vital signs in last 24 hours: Vitals:   07/30/18 1500 07/30/18 1530 07/30/18 1556 07/30/18 1740  BP: (!) 143/82 (!) 147/79 (!) 152/81 (!) 145/76  Pulse: 63 61 62 73  Resp: 17 16 18 17   Temp:   97.7 F (36.5 C) (!) 97.5 F (36.4 C)  TempSrc:   Oral Oral  SpO2:   98% 100%  Weight:   61.6 kg   Height:       Weight change: -0.86 kg  Intake/Output Summary (Last 24 hours) at 07/31/2018 1030 Last data filed at 07/31/2018 0600 Gross per 24 hour  Intake 0 ml  Output 1300 ml  Net -1300 ml    Assessment/ Plan: Pt is a 58 y.o. 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   started on prednisone 60 on 3/9 and on cytoxan 50 on 3/13- I increased to 100 mg daily on 3/16 for a total more like 2 mg per kg that is recommended.  Kidney function had stayed poor but relatively stable -  Since BUN well over 100 I decided to support with HD- s/p one treatment yesterday - now the plan will be to observe.  If kidney function maintains could go home off of HD, but if worsens will need to make arrangements to leave on HD  2. Anemia- hgb in the 7's- will likely not increase much in the face of cytoxan- iron is low, needs repletion- ordered feraheme and is already on darbe   3. Elytes- K, calc and bicarb OK-decreased sodium bicarb supplement- phos just up a little- no action yet - HD will help correct in the short term  4. HTN/volume- BP better- now on steroids - good UOP - norvasc and cardura - then added aldactone with good results   [name]    Labs: Basic Metabolic Panel: Recent Labs   Lab 07/29/18 0431 07/30/18 0524 07/31/18 0537  NA 138 137 139  K 4.0 4.0 3.9  CL 104 102 102  CO2 22 23 25   GLUCOSE 113* 136* 138*  BUN 122* 123* 83*  CREATININE 6.89* 6.91* 5.35*  CALCIUM 8.9 8.7* 8.8*  PHOS 6.1* 6.0* 5.7*   Liver Function Tests: Recent Labs  Lab 07/29/18 0431 07/30/18 0524 07/31/18 0537  ALBUMIN 2.9* 2.8* 2.7*   No results for input(s): LIPASE, AMYLASE in the last 168 hours. No results for input(s): AMMONIA in the last 168 hours. CBC: Recent Labs  Lab 07/25/18 0744 07/26/18 0752 07/27/18 0317 07/28/18 0948 07/29/18 0431  WBC 5.2 6.6 6.9 5.4 5.5  HGB 7.5* 8.5* 7.4* 7.6* 7.6*  HCT 22.1* 25.4* 21.4* 22.5* 23.1*  MCV 87.0 87.6 87.3 88.9 88.5  PLT 121* 127* 127* 115* 106*   Cardiac Enzymes: No results for input(s): CKTOTAL, CKMB, CKMBINDEX, TROPONINI in the last 168 hours. CBG: No results for input(s): GLUCAP in the last 168 hours.  Iron Studies: No results for input(s): IRON, TIBC, TRANSFERRIN, FERRITIN in the last 72 hours. Studies/Results: No results found. Medications: Infusions:   Scheduled Medications: . amLODipine  10 mg Oral Daily  . Chlorhexidine Gluconate Cloth  6 each Topical Q0600  . cyclophosphamide  100 mg Oral Daily  . darbepoetin (ARANESP) injection - NON-DIALYSIS  200 mcg Subcutaneous Q Sun-1800  . doxazosin  4 mg Oral Q12H  . famotidine  20 mg Oral Daily  . folic acid  1 mg Oral Daily  . multivitamin  1 tablet Oral QHS  . predniSONE  60 mg Oral Q breakfast  . senna-docusate  1 tablet Oral BID  . sodium bicarbonate  650 mg Oral BID  . spironolactone  12.5 mg Oral BID  . thiamine  100 mg Oral Daily    have reviewed scheduled and prn medications.  Physical Exam: General: alert, looks good Heart: RRR Lungs: clear Abdomen: soft, non tender Extremities: no edema Dialysis Access: right ij TDC     07/31/2018,10:30 AM  LOS: 15 days

## 2018-07-31 NOTE — Telephone Encounter (Signed)
Scheduled appt per 3/19 sch message - pt wife aware of appt  

## 2018-07-31 NOTE — Progress Notes (Signed)
PROGRESS NOTE    Jeremy Grant  MWU:[id number] DOB: December 17, 1960 DOA: 07/16/2018 PCP: Hayden Rasmussen, MD    Brief Narrative:  58 year old with past medical history significant for hypertension, ethanol abuse, nonischemic cardiomyopathy, had a ejection fraction 20% with grade 3 diastolic dysfunction in 7253, thankfully normalized with normal repeated echo in 2017 and repeated echo this admission with normal ejection fraction presented with new onset anemia and acute kidney injury. He was seen by his PCP in the office who referred him for admission. Patient hemoglobin was at 5 and a creatinine at 5.  He was admitted for Acute kidney injury and anemia.  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  There was also some concern with hydralazine causing Glomerulonephritis due to positive antihistone antibody. He also had mild spike of M protein on SPEP UPEP.  Oncology was consulted and underwent a bone marrow biopsy with no evidence of multiple myeloma or other significant abnormalities.  Assessment & Plan:   Principal Problem:   Acute kidney failure ([place]) Active Problems:   Malignant hypertension   NICM (nonischemic cardiomyopathy) (HCC)   History of ETOH abuse   Chronic combined systolic and diastolic CHF (congestive heart failure) (HCC)   Normocytic normochromic anemia   Pancytopenia (HCC)   Multiple myeloma (HCC)   Elevated serum protein level   Malnutrition of moderate degree   Estimated body mass index is 17.92 kg/m as calculated from the following:   Height as of this encounter: '6\' 1"'$  (1.854 m).   Weight as of this encounter: 61.6 kg.  Acute kidney injury Creatinine 1.32 in December 2017.  Presented with creatinine of 6.23. Nephrology consulted and assisted with evaluation and management. 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 Hydralazine has been discontinued. He received  IV Solu-Medrol that was changed to 60 mg of prednisone daily. He also has an M spike on SPEP UPEP.  Skeletal bone survey negative.  Oncology  consulted. S/P tunneled dialysis catheter placement. Started on cytoxan along with prednisone.  Urine out put good 1.8 L Cr steadily increasing for the last 3 days 6.26 > 6.89 > 6.9 Since BUN 123 and creatinine 6.91 on 3/18, patient underwent a 2-hour HD on 3/18 and nephrology plans to observe with daily BMP.  If renal functions remain stable or improve, she could go home off of HD but if worsened then will need continued HD.  Nephrology have initiated clip process just in case there is no recovery.  I discussed with Dr. Moshe Cipro, nephrology.  Pancytopenia Present with a hemoglobin at 4 on admission.  He received 3 units of packed red blood cell.  He has also received IV iron. SPEP UPEP with spike M. Oncology was consulted.  Patient underwent Bone Marrow biopsy 3/12 which showed mildly hypercellular marrow with trilineage hematopoiesis and polytypic plasmacytosis.  The findings of the marrow are nonspecific and there is no definitive features to suggest hemopoietic neoplasm, correlation with cytogenetics was recommended. As per oncology follow-up, bone marrow shows no evidence of multiple myeloma or anything else concerning from a hematological standpoint and they plan to follow outpatient in 4 to 6 weeks with repeat myeloma panel. Leukopenia resolved. Thrombocytopenia slowly worsening. Follow CBC in a.m.   Acute blood loss anemia:  After biopsy suspect hematoma post biopsy. Received 2 units PRBC 3-11. Hb stable in the mid 7 g range for the  last several days. Started on aranesp/  He received IV iron for 5 days. Follow CBC in a.m.  History of nonischemic cardiomyopathy:  Normalized by echo 2017.  Monitor. Repeated ECHO with normal EF 3-12.  HTN;  resume Norvasc &  Doxazosin.  Unable to use hydralazine due to concern for hydralazine causing glomerulonephritis.  Mildly uncontrolled. Unable to add ACEI/ARB/BB/Clonidine d/t bradycardia.  Nephrology added Aldactone. Controlled today.     DVT prophylaxis: SCDs Code Status: Full code Family Communication: None at bedside. Disposition Plan: DC home pending improvement/stability of renal insufficiency.  Consultants:   Nephrology  Oncology  IR  Procedures:  Renal biopsy 3/10 HD 3/18   Antimicrobials:   None   Subjective: Patient denies complaints.  Underwent HD 3/18 for 2 hours.  Has been ambulating comfortably.  Objective: Vitals:   07/30/18 1500 07/30/18 1530 07/30/18 1556 07/30/18 1740  BP: (!) 143/82 (!) 147/79 (!) 152/81 (!) 145/76  Pulse: 63 61 62 73  Resp: '17 16 18 17  '$ Temp:   97.7 F (36.5 C) (!) 97.5 F (36.4 C)  TempSrc:   Oral Oral  SpO2:   98% 100%  Weight:   61.6 kg   Height:        Intake/Output Summary (Last 24 hours) at 07/31/2018 1508 Last data filed at 07/31/2018 1454 Gross per 24 hour  Intake 400 ml  Output 1700 ml  Net -1300 ml   Filed Weights   07/29/18 2138 07/30/18 1340 07/30/18 1556  Weight: 62.5 kg 61.6 kg 61.6 kg    Examination: No significant change in clinical exam compared to yesterday.  General exam: Pleasant you male moderately built and nourished sitting up comfortably in bed. Respiratory system: Clear to auscultation. No increased work of breathing. Cardiovascular system: S1, S2 regular rhythm and rate. No JVD or pedal edema.  Gastrointestinal system: Bowel sounds present, soft nontender nondistended.   Central nervous system: Alert.  No focal neurological deficits.  No asterixis. Extremities: Symmetric 5 x 5 power.  Data Reviewed: I have personally reviewed following labs and imaging studies  CBC: Recent Labs  Lab 07/25/18 0744 07/26/18 0752 07/27/18 0317 07/28/18 0948 07/29/18 0431  WBC 5.2 6.6 6.9 5.4 5.5  HGB 7.5* 8.5*  7.4* 7.6* 7.6*  HCT 22.1* 25.4* 21.4* 22.5* 23.1*  MCV 87.0 87.6 87.3 88.9 88.5  PLT 121* 127* 127* 115* 557*   Basic Metabolic Panel: Recent Labs  Lab 07/27/18 0317 07/28/18 0948 07/29/18 0431 07/30/18 0524 07/31/18 0537  NA 140 138 138 137 139  K 4.2 4.3 4.0 4.0 3.9  CL 104 100 104 102 102  CO2 '24 25 22 23 25  '$ GLUCOSE 121* 178* 113* 136* 138*  BUN 116* 117* 122* 123* 83*  CREATININE 5.92* 6.26* 6.89* 6.91* 5.35*  CALCIUM 8.9 8.7* 8.9 8.7* 8.8*  PHOS 5.7* 5.1* 6.1* 6.0* 5.7*   GFR: Estimated Creatinine Clearance: 13.1 mL/min (A) (by C-G formula based on SCr of 5.35 mg/dL (H)). Liver Function Tests: Recent Labs  Lab 07/27/18 0317 07/28/18 0948 07/29/18 0431 07/30/18 0524 07/31/18 0537  ALBUMIN 3.0* 2.9* 2.9* 2.8* 2.7*   Coagulation Profile: No results for input(s): INR, PROTIME in the last 168 hours.  Radiology Studies: No results found.      Scheduled Meds:  amLODipine  10 mg Oral Daily   Chlorhexidine Gluconate Cloth  6 each Topical Q0600   cyclophosphamide  100 mg Oral Daily   darbepoetin (ARANESP) injection - NON-DIALYSIS  200 mcg Subcutaneous Q Sun-1800  doxazosin  4 mg Oral Q12H   famotidine  20 mg Oral Daily   folic acid  1 mg Oral Daily   multivitamin  1 tablet Oral QHS   predniSONE  60 mg Oral Q breakfast   senna-docusate  1 tablet Oral BID   sodium bicarbonate  650 mg Oral BID   spironolactone  12.5 mg Oral BID   thiamine  100 mg Oral Daily   Continuous Infusions:    LOS: 15 days   Vernell Leep, MD, FACP, [facility]. [facility]  To contact the attending provider between 7A-7P or the covering provider during after hours 7P-7A, please log into the web site [email or web address] and access using universal [place] password for that web site. If you do not have the password, please call the hospital operator.

## 2018-08-01 LAB — RENAL FUNCTION PANEL
Albumin: 2.9 g/dL — ABNORMAL LOW (ref 3.5–5.0)
Anion gap: 14 (ref 5–15)
BUN: 104 mg/dL — ABNORMAL HIGH (ref 6–20)
CO2: 22 mmol/L (ref 22–32)
Calcium: 8.7 mg/dL — ABNORMAL LOW (ref 8.9–10.3)
Chloride: 102 mmol/L (ref 98–111)
Creatinine, Ser: 6.78 mg/dL — ABNORMAL HIGH (ref 0.61–1.24)
GFR calc Af Amer: 9 mL/min — ABNORMAL LOW (ref >60)
GFR calc non Af Amer: 8 mL/min — ABNORMAL LOW (ref >60)
Glucose, Bld: 179 mg/dL — ABNORMAL HIGH (ref 70–99)
PHOSPHORUS: 5.8 mg/dL — AB (ref 2.5–4.6)
Potassium: 3.8 mmol/L (ref 3.5–5.1)
Sodium: 138 mmol/L (ref 135–145)

## 2018-08-01 LAB — CBC
HCT: 22.4 % — ABNORMAL LOW (ref 39.0–52.0)
Hemoglobin: 7.4 g/dL — ABNORMAL LOW (ref 13.0–17.0)
MCH: 29.5 pg (ref 26.0–34.0)
MCHC: 33 g/dL (ref 30.0–36.0)
MCV: 89.2 fL (ref 80.0–100.0)
Platelets: 96 10*3/uL — ABNORMAL LOW (ref 150–400)
RBC: 2.51 MIL/uL — ABNORMAL LOW (ref 4.22–5.81)
RDW: 13.2 % (ref 11.5–15.5)
WBC: 6 10*3/uL (ref 4.0–10.5)
nRBC: 0 % (ref 0.0–0.2)

## 2018-08-01 MED ORDER — CHLORHEXIDINE GLUCONATE CLOTH 2 % EX PADS: 6.0000 | MEDICATED_PAD | Freq: Every day | CUTANEOUS | Status: DC

## 2018-08-01 MED ORDER — SODIUM CHLORIDE 0.9 % IV SOLN
510.0000 mg | Freq: Once | INTRAVENOUS | Status: AC
  Administered 2018-08-01: 510 mg via INTRAVENOUS
  Filled 2018-08-01: qty 17

## 2018-08-01 MED ORDER — SPIRONOLACTONE 25 MG PO TABS
25.0000 mg | ORAL_TABLET | Freq: Two times a day (BID) | ORAL | Status: DC
  Administered 2018-08-01 – 2018-08-04 (×6): 25 mg via ORAL
  Filled 2018-08-01 (×6): qty 1

## 2018-08-01 NOTE — Progress Notes (Signed)
Subjective:  2400 of UOP - BP not low - unfortunately both BUN and creatinine are up from yesterday  Objective Vital signs in last 24 hours: Vitals:   07/30/18 1740 07/31/18 1801 07/31/18 2100 08/01/18 0557  BP: (!) 145/76 (!) 152/77 (!) 187/83 (!) 175/77  Pulse: 73 69 72 65  Resp: 17 16 (!) 22 (!) 21  Temp: (!) 97.5 F (36.4 C) (!) 97.4 F (36.3 C) 98 F (36.7 C) 98.1 F (36.7 C)  TempSrc: Oral Oral Oral Oral  SpO2: 100% 100% 100% 99%  Weight:   62.5 kg   Height:       Weight change: 0.9 kg  Intake/Output Summary (Last 24 hours) at 08/01/2018 1137 Last data filed at 08/01/2018 0845 Gross per 24 hour  Intake 1110 ml  Output 2850 ml  Net -1740 ml    Assessment/ Plan: Pt is a 58 y.o. 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   started on prednisone 60 on 3/9 and on cytoxan 50 on 3/13- I increased to 100 mg daily on 3/16.  Kidney function had stayed poor but relatively stable -  Since BUN well over 100 I decided to support with HD- s/p one treatment 3/19 - unfortunately kidney function worse from yest to today.  Will likely need more HD- will watch one more day and do HD tomorrow if needed - will CLIP for OP spot.  If needs to continue HD will likely need to stay over weekend til OP HD arranged    2. Anemia- hgb in the 7's- will likely not increase much in the face of cytoxan- iron is low, needs repletion- ordered feraheme and is already on darbe   3. Elytes- K, calc and bicarb OK-decreased sodium bicarb supplement- phos just up a little- no action yet - HD will help correct in the short term  4. HTN/volume- BP high again - now on steroids - good UOP -  norvasc and cardura - added aldactone with good results - will increase   [name]    Labs: Basic Metabolic Panel: Recent Labs  Lab 07/30/18 0524 07/31/18 0537 08/01/18 0552  NA 137 139 138  K 4.0 3.9 3.8  CL 102 102 102  CO2 23 25 22   GLUCOSE 136* 138* 179*  BUN 123* 83* 104*  CREATININE 6.91* 5.35* 6.78*  CALCIUM 8.7* 8.8* 8.7*  PHOS 6.0* 5.7* 5.8*   Liver Function Tests: Recent Labs  Lab 07/30/18 0524 07/31/18 0537 08/01/18 0552  ALBUMIN 2.8* 2.7* 2.9*   No results for input(s): LIPASE, AMYLASE in the last 168 hours. No results for input(s): AMMONIA in the last 168 hours. CBC: Recent Labs  Lab 07/26/18 0752 07/27/18 0317 07/28/18 0948 07/29/18 0431 08/01/18 0552  WBC 6.6 6.9 5.4 5.5 6.0  HGB 8.5* 7.4* 7.6* 7.6* 7.4*  HCT 25.4* 21.4* 22.5* 23.1* 22.4*  MCV 87.6 87.3 88.9 88.5 89.2  PLT 127* 127* 115* 106* 96*   Cardiac Enzymes: No results for input(s): CKTOTAL, CKMB, CKMBINDEX, TROPONINI in the last 168 hours. CBG: No results for input(s): GLUCAP in the last 168 hours.  Iron Studies: No results for input(s): IRON, TIBC, TRANSFERRIN,  FERRITIN in the last 72 hours. Studies/Results: No results found. Medications: Infusions:   Scheduled Medications: . amLODipine  10 mg Oral Daily  . Chlorhexidine Gluconate Cloth  6 each Topical Q0600  . cyclophosphamide  100 mg Oral Daily  . darbepoetin (ARANESP) injection - NON-DIALYSIS  200 mcg Subcutaneous Q Sun-1800  . doxazosin  4 mg Oral Q12H  . famotidine  20 mg Oral Daily  . folic acid  1 mg Oral Daily  . multivitamin  1 tablet Oral QHS  . predniSONE  60 mg Oral Q breakfast  . senna-docusate  1 tablet Oral BID  . sodium bicarbonate  650 mg Oral BID  . spironolactone  12.5 mg Oral BID  . thiamine  100 mg Oral Daily    have reviewed scheduled and prn medications.  Physical Exam: General: alert, looks good Heart: RRR Lungs: clear Abdomen: soft, non tender Extremities: no edema Dialysis  Access: right ij TDC     08/01/2018,11:37 AM  LOS: 16 days

## 2018-08-01 NOTE — Progress Notes (Signed)
PROGRESS NOTE    Jeremy Grant  FGH:[id number] DOB: [birth year] DOA: 07/16/2018 PCP: Hayden Rasmussen, MD    Brief Narrative:  58 year old with past medical history significant for hypertension, ethanol abuse, nonischemic cardiomyopathy, had a ejection fraction 20% with grade 3 diastolic dysfunction in 6789, thankfully normalized with normal repeated echo in 2017 and repeated echo this admission with normal ejection fraction presented with new onset anemia and acute kidney injury. He was seen by his PCP in the office who referred him for admission. Patient hemoglobin was at 5 and a creatinine at 5.  He was admitted for Acute kidney injury and anemia.  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  There was also some concern with hydralazine causing Glomerulonephritis due to positive antihistone antibody. He also had mild spike of M protein on SPEP UPEP.  Oncology was consulted and underwent a bone marrow biopsy with no evidence of multiple myeloma or other significant abnormalities.  Assessment & Plan:   Principal Problem:   Acute kidney failure (HCC) Active Problems:   Malignant hypertension   NICM (nonischemic cardiomyopathy) (HCC)   History of ETOH abuse   Chronic combined systolic and diastolic CHF (congestive heart failure) (HCC)   Normocytic normochromic anemia   Pancytopenia (HCC)   Multiple myeloma (HCC)   Elevated serum protein level   Malnutrition of moderate degree   Estimated body mass index is 18.18 kg/m as calculated from the following:   Height as of this encounter: _0  (1.854 m).   Weight as of this encounter: 62.5 kg.  Acute kidney injury Creatinine 1.32 in December 2017.  Presented with creatinine of 6.23. Nephrology consulted and assisted with evaluation and management. 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 Hydralazine has been discontinued. He received  IV Solu-Medrol that was changed to 60 mg of prednisone daily. He also has an M spike on SPEP UPEP.  Skeletal bone survey negative.  Oncology  consulted. S/P tunneled dialysis catheter placement. Started on cytoxan along with prednisone.  Urine out put good 1.8 L Cr steadily increasing for the last 3 days 6.26 > 6.89 > 6.9 Since BUN 123 and creatinine 6.91 on 3/18, patient underwent a 2-hour HD on 3/18 and nephrology plans to observe with daily BMP.  If renal functions remain stable or improve, he could go home off of HD but if worsened then will need continued HD.  Nephrology have initiated clip process just in case there is no recovery.   Creatinine up again from 5.35-6.78.  Management as outlined above.  Follow BMP in a.m.  No indication for urgent dialysis.  Good urine output/approximately 2.5 L yesterday.  Pancytopenia Present with a hemoglobin at 4 on admission.  He received 3 units of packed red blood cell.  He has also received IV iron. SPEP UPEP with spike M. Oncology was consulted.  Patient underwent Bone Marrow biopsy 3/12 which showed mildly hypercellular marrow with trilineage hematopoiesis and polytypic plasmacytosis.  The findings of the marrow are nonspecific and there is no definitive features to suggest hemopoietic neoplasm, correlation with cytogenetics was recommended. As per oncology follow-up, bone marrow shows no evidence of multiple myeloma or anything else concerning from a hematological standpoint and they plan to follow outpatient in 4 to 6 weeks with repeat myeloma panel. Leukopenia resolved.  Hemoglobin stable in the 7.4-7.6 range.  Platelets however gradually declining,  down to 96 today.  Unclear etiology.?  Related to medications.  No bleeding reported.  Continue to follow daily CBCs.   Acute blood loss anemia:  After biopsy suspect hematoma post biopsy. Received 2 units PRBC 3-11.  Started on aranesp/  He received IV iron for 5 days. Hemoglobin stable in the 7.4-7.6 range for the last few days.  History of nonischemic cardiomyopathy:  Normalized by echo 2017.  Monitor. Repeated ECHO with normal EF 3-12.  HTN;  resume Norvasc & Doxazosin.  Unable to use hydralazine due to concern for hydralazine causing glomerulonephritis.  Mildly uncontrolled. Unable to add ACEI/ARB/BB/Clonidine d/t bradycardia.  Nephrology added Aldactone. BP fluctuating, up last night and early this morning.     DVT prophylaxis: SCDs Code Status: Full code Family Communication: None at bedside. Disposition Plan: DC home pending improvement/stability of renal insufficiency.  Consultants:   Nephrology  Oncology  IR  Procedures:  Renal biopsy 3/10 HD 3/18   Antimicrobials:   None   Subjective: Patient denies complaints.  Objective: Vitals:   07/30/18 1740 07/31/18 1801 07/31/18 2100 08/01/18 0557  BP: (!) 145/76 (!) 152/77 (!) 187/83 (!) 175/77  Pulse: 73 69 72 65  Resp: 17 16 (!) 22 (!) 21  Temp: (!) 97.5 F (36.4 C) (!) 97.4 F (36.3 C) 98 F (36.7 C) 98.1 F (36.7 C)  TempSrc: Oral Oral Oral Oral  SpO2: 100% 100% 100% 99%  Weight:   62.5 kg   Height:        Intake/Output Summary (Last 24 hours) at 08/01/2018 1101 Last data filed at 08/01/2018 0845 Gross per 24 hour  Intake 1110 ml  Output 2850 ml  Net -1740 ml   Filed Weights   07/30/18 1340 07/30/18 1556 07/31/18 2100  Weight: 61.6 kg 61.6 kg 62.5 kg    Examination: Stable and no significant change in clinical exam over the last couple of days.  General exam: Pleasant you male moderately built and nourished lying comfortably propped up in bed.  Oral mucosa moist. Respiratory system: Clear to auscultation. No increased work of breathing. Cardiovascular system: S1, S2 regular rhythm and rate. No JVD or pedal edema.  Gastrointestinal system: Bowel sounds present, soft nontender nondistended.    Central nervous system: Alert.  No focal neurological deficits.  No asterixis. Extremities: Symmetric 5 x 5 power.  Data Reviewed: I have personally reviewed following labs and imaging studies  CBC: Recent Labs  Lab 07/26/18 0752 07/27/18 0317 07/28/18 0948 07/29/18 0431 08/01/18 0552  WBC 6.6 6.9 5.4 5.5 6.0  HGB 8.5* 7.4* 7.6* 7.6* 7.4*  HCT 25.4* 21.4* 22.5* 23.1* 22.4*  MCV 87.6 87.3 88.9 88.5 89.2  PLT 127* 127* 115* 106* 96*   Basic Metabolic Panel: Recent Labs  Lab 07/28/18 0948 07/29/18 0431 07/30/18 0524 07/31/18 0537 08/01/18 0552  NA 138 138 137 139 138  K 4.3 4.0 4.0 3.9 3.8  CL 100 104 102 102 102  CO2 _0 GLUCOSE 178* 113* 136* 138* 179*  BUN 117* 122* 123* 83* 104*  CREATININE 6.26* 6.89* 6.91* 5.35* 6.78*  CALCIUM 8.7* 8.9 8.7* 8.8* 8.7*  PHOS 5.1* 6.1* 6.0* 5.7* 5.8*   GFR: Estimated Creatinine Clearance: 10.5 mL/min (A) (by C-G formula based on SCr of 6.78 mg/dL (H)). Liver Function Tests: Recent Labs  Lab 07/28/18 0948 07/29/18 0431 07/30/18 0524 07/31/18 0537 08/01/18 0552  ALBUMIN 2.9* 2.9* 2.8* 2.7* 2.9*   Coagulation Profile: No results for input(s): INR, PROTIME in  the last 168 hours.  Radiology Studies: No results found.      Scheduled Meds: . amLODipine  10 mg Oral Daily  . Chlorhexidine Gluconate Cloth  6 each Topical Q0600  . cyclophosphamide  100 mg Oral Daily  . darbepoetin (ARANESP) injection - NON-DIALYSIS  200 mcg Subcutaneous Q Sun-1800  . doxazosin  4 mg Oral Q12H  . famotidine  20 mg Oral Daily  . folic acid  1 mg Oral Daily  . multivitamin  1 tablet Oral QHS  . predniSONE  60 mg Oral Q breakfast  . senna-docusate  1 tablet Oral BID  . sodium bicarbonate  650 mg Oral BID  . spironolactone  12.5 mg Oral BID  . thiamine  100 mg Oral Daily   Continuous Infusions:    LOS: 16 days   Vernell Leep, MD, FACP, [facility]. [facility]  To contact the attending provider between 7A-7P or the  covering provider during after hours 7P-7A, please log into the web site [email or web address] and access using universal [place] password for that web site. If you do not have the password, please call the hospital operator.

## 2018-08-01 NOTE — TOC Benefit Eligibility Note (Signed)
Transition of Care [facility]) Benefit Eligibility Note    Patient Details  Name: Jeremy Grant MRN: [id number] Date of Birth: [birth year]   Medication/Dose: Cyclophosphamide 50mg  tab 2 tabs daily  Covered?: Yes  Prescription Coverage [facility]  Spoke with Person/Company/Phone Number:: [facility]/ [phone] number to send RX: [phone])  Co-Pay: 30 day mail-order: $630.66, unable to tell what co-pay would be after deductible  Prior Approval: No  Deductible: Unmet(patients deductible is $1400)  Additional Notes: there is a [place] coupon that patient can use at retail pharmacy that wouldn't cost as much for patient    Delorse Lek Phone Number: 08/01/2018, 2:01 PM

## 2018-08-02 LAB — CBC
HCT: 25.6 % — ABNORMAL LOW (ref 39.0–52.0)
Hemoglobin: 8.5 g/dL — ABNORMAL LOW (ref 13.0–17.0)
MCH: 29.7 pg (ref 26.0–34.0)
MCHC: 33.2 g/dL (ref 30.0–36.0)
MCV: 89.5 fL (ref 80.0–100.0)
Platelets: 121 10*3/uL — ABNORMAL LOW (ref 150–400)
RBC: 2.86 MIL/uL — ABNORMAL LOW (ref 4.22–5.81)
RDW: 13.2 % (ref 11.5–15.5)
WBC: 7.4 10*3/uL (ref 4.0–10.5)
nRBC: 0 % (ref 0.0–0.2)

## 2018-08-02 LAB — RENAL FUNCTION PANEL
Albumin: 2.9 g/dL — ABNORMAL LOW (ref 3.5–5.0)
Anion gap: 12 (ref 5–15)
BUN: 113 mg/dL — ABNORMAL HIGH (ref 6–20)
CO2: 23 mmol/L (ref 22–32)
Calcium: 9 mg/dL (ref 8.9–10.3)
Chloride: 103 mmol/L (ref 98–111)
Creatinine, Ser: 6.91 mg/dL — ABNORMAL HIGH (ref 0.61–1.24)
GFR calc Af Amer: 9 mL/min — ABNORMAL LOW (ref >60)
GFR calc non Af Amer: 8 mL/min — ABNORMAL LOW (ref >60)
Glucose, Bld: 171 mg/dL — ABNORMAL HIGH (ref 70–99)
Phosphorus: 6.5 mg/dL — ABNORMAL HIGH (ref 2.5–4.6)
Potassium: 4.2 mmol/L (ref 3.5–5.1)
SODIUM: 138 mmol/L (ref 135–145)

## 2018-08-02 NOTE — Progress Notes (Signed)
Subjective:  1600 of UOP - BP not low -  BUN and creatinine kind of stable - not uremic  Objective Vital signs in last 24 hours: Vitals:   08/01/18 0557 08/01/18 1837 08/01/18 2106 08/02/18 0429  BP: (!) 175/77 (!) 152/79 (!) 178/80 (!) 170/82  Pulse: 65 61 67 (!) 58  Resp: (!) 21 16 16 16   Temp: 98.1 F (36.7 C) 97.8 F (36.6 C) 98.3 F (36.8 C) 97.9 F (36.6 C)  TempSrc: Oral Oral Oral Oral  SpO2: 99% 98% 100% 97%  Weight:   62.5 kg   Height:       Weight change: -0.04 kg  Intake/Output Summary (Last 24 hours) at 08/02/2018 1236 Last data filed at 08/02/2018 0900 Gross per 24 hour  Intake 734.77 ml  Output 1125 ml  Net -390.23 ml    Assessment/ Plan: Pt is a 58 y.o. 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   started on prednisone 60 on 3/9 and on cytoxan 50 on 3/13- I increased to 100 mg daily on 3/16.  Kidney function had stayed poor but relatively stable -  Since BUN well over 100 I decided to support with HD- s/p one treatment 3/19 - unfortunately kidney function worse the first day off of HD but more stable today.  Will watch off of HD over weekend - - will CLIP for OP spot as AKI just in case he needs.  If needs to continue HD will likely need to stay over weekend til OP HD arranged.  I am hoping and praying that his renal function will improve so that he wont need HD long term    2. Anemia- hgb in the 7's- will likely not increase much in the face of cytoxan- iron is low, needs repletion-  feraheme and is already on darbe   3. Elytes- K, calc and bicarb OK-decreased sodium bicarb supplement- phos just up a little- no action yet - HD will help  correct in the short term  4. HTN/volume- BP high again -  on steroids - good UOP - norvasc and cardura - added aldactone with good results - increased on 3/20   [facility] A Magaret Justo    Labs: Basic Metabolic Panel: Recent Labs  Lab 07/31/18 0537 08/01/18 0552 08/02/18 0340  NA 139 138 138  K 3.9 3.8 4.2  CL 102 102 103  CO2 25 22 23   GLUCOSE 138* 179* 171*  BUN 83* 104* 113*  CREATININE 5.35* 6.78* 6.91*  CALCIUM 8.8* 8.7* 9.0  PHOS 5.7* 5.8* 6.5*   Liver Function Tests: Recent Labs  Lab 07/31/18 0537 08/01/18 0552 08/02/18 0340  ALBUMIN 2.7* 2.9* 2.9*   No results for input(s): LIPASE, AMYLASE in the last 168 hours. No results for input(s): AMMONIA in the last 168 hours. CBC: Recent Labs  Lab 07/27/18 0317 07/28/18 0948 07/29/18 0431 08/01/18 0552 08/02/18 0340  WBC 6.9 5.4 5.5 6.0 7.4  HGB 7.4* 7.6* 7.6* 7.4* 8.5*  HCT 21.4* 22.5* 23.1* 22.4* 25.6*  MCV 87.3 88.9 88.5 89.2 89.5  PLT 127* 115* 106* 96* 121*   Cardiac Enzymes: No results for input(s): CKTOTAL, CKMB, CKMBINDEX, TROPONINI in  the last 168 hours. CBG: No results for input(s): GLUCAP in the last 168 hours.  Iron Studies: No results for input(s): IRON, TIBC, TRANSFERRIN, FERRITIN in the last 72 hours. Studies/Results: No results found. Medications: Infusions:   Scheduled Medications: . amLODipine  10 mg Oral Daily  . Chlorhexidine Gluconate Cloth  6 each Topical Q0600  . cyclophosphamide  100 mg Oral Daily  . darbepoetin (ARANESP) injection - NON-DIALYSIS  200 mcg Subcutaneous Q Sun-1800  . doxazosin  4 mg Oral Q12H  . famotidine  20 mg Oral Daily  . folic acid  1 mg Oral Daily  . multivitamin  1 tablet Oral QHS  . predniSONE  60 mg Oral Q breakfast  . senna-docusate  1 tablet Oral BID  . sodium bicarbonate  650 mg Oral BID  . spironolactone  25 mg Oral BID  . thiamine  100 mg Oral Daily    have reviewed scheduled and prn medications.  Physical Exam: General: alert, looks good-  not uremic  Heart: RRR Lungs: clear Abdomen: soft, non tender Extremities: no edema Dialysis Access: right ij TDC     08/02/2018,12:36 PM  LOS: 17 days

## 2018-08-02 NOTE — Progress Notes (Signed)
PROGRESS NOTE    Jeremy Grant  WUX:[id number] DOB: 03-27-1961 DOA: 07/16/2018 PCP: Hayden Rasmussen, MD    Brief Narrative:  58 year old with past medical history significant for hypertension, ethanol abuse, nonischemic cardiomyopathy, had a ejection fraction 20% with grade 3 diastolic dysfunction in 2536, thankfully normalized with normal repeated echo in 2017 and repeated echo this admission with normal ejection fraction presented with new onset anemia and acute kidney injury. He was seen by his PCP in the office who referred him for admission. Patient hemoglobin was at 5 and a creatinine at 5.  He was admitted for Acute kidney injury and anemia.  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  There was also some concern with hydralazine causing Glomerulonephritis due to positive antihistone antibody. He also had mild spike of M protein on SPEP UPEP.  Oncology was consulted and underwent a bone marrow biopsy with no evidence of multiple myeloma or other significant abnormalities.  Assessment & Plan:   Principal Problem:   Acute kidney failure (HCC) Active Problems:   Malignant hypertension   NICM (nonischemic cardiomyopathy) (HCC)   History of ETOH abuse   Chronic combined systolic and diastolic CHF (congestive heart failure) (HCC)   Normocytic normochromic anemia   Pancytopenia (HCC)   Multiple myeloma (HCC)   Elevated serum protein level   Malnutrition of moderate degree   Estimated body mass index is 18.17 kg/m as calculated from the following:   Height as of this encounter: '6\' 1"'$  (1.854 m).   Weight as of this encounter: 62.5 kg.  Acute kidney injury Creatinine 1.32 in December 2017.  Presented with creatinine of 6.23. Nephrology consulted and assisted with evaluation and management. 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 Hydralazine has been discontinued. He received  IV Solu-Medrol that was changed to 60 mg of prednisone daily. He also has an M spike on SPEP UPEP.  Skeletal bone survey negative.  Oncology  consulted. S/P tunneled dialysis catheter placement. Started on cytoxan along with prednisone.  Urine out put good 1.8 L Cr steadily increasing for the last 3 days 6.26 > 6.89 > 6.9 Since BUN 123 and creatinine 6.91 on 3/18, patient underwent a 2-hour HD on 3/18 and nephrology plans to observe with daily BMP.  If renal functions remain stable or improve, he could go home off of HD but if worsened then will need continued HD.  Nephrology have initiated clip process just in case there is no recovery.   Since last HD on 3/18, creatinine gradually increasing but not clinically uremic.  Nephrology plan to monitor over weekend off of HD and hoping he will not need long-term HD.  Pancytopenia Present with a hemoglobin at 4 on admission.  He received 3 units of packed red blood cell.  He has also received IV iron. SPEP UPEP with spike M. Oncology was consulted.  Patient underwent Bone Marrow biopsy 3/12 which showed mildly hypercellular marrow with trilineage hematopoiesis and polytypic plasmacytosis.  The findings of the marrow are nonspecific and there is no definitive features to suggest hemopoietic neoplasm, correlation with cytogenetics was recommended. As per oncology follow-up, bone marrow shows no evidence of multiple myeloma or anything else concerning from a hematological standpoint and they plan to follow outpatient in 4 to 6 weeks with repeat myeloma panel. Leukopenia resolved.  Hemoglobin and platelets better today.  Unclear etiology.?  Related to medications.  No bleeding reported.  Continue to follow CBCs periodically.   Acute blood loss anemia:  After biopsy suspect hematoma post biopsy. Received 2 units PRBC 3-11. Started on aranesp/  He received IV iron for 5 days.  Hemoglobin up to 8.6.  History of nonischemic cardiomyopathy:  Normalized by echo 2017.  Monitor. Repeated ECHO with normal EF 3-12.  HTN;  Unable to use hydralazine due to concern for hydralazine causing glomerulonephritis.  Unable to add ACEI/ARB due to acute renal failure and BB/Clonidine d/t bradycardia.  Nephrology added Aldactone. Currently on amlodipine 10 mg daily, doxazosin 4 mg twice daily and Aldactone 25 mg twice daily.  Blood pressures mildly uncontrolled and fluctuating.     DVT prophylaxis: SCDs Code Status: Full code Family Communication: None at bedside. Disposition Plan: DC home pending improvement/stability of renal insufficiency.  Consultants:   Nephrology  Oncology  IR  Procedures:  Renal biopsy 3/10 HD 3/18   Antimicrobials:   None   Subjective: Patient continues to report no complaints.  No nausea or vomiting.  No weakness.  Objective: Vitals:   08/01/18 0557 08/01/18 1837 08/01/18 2106 08/02/18 0429  BP: (!) 175/77 (!) 152/79 (!) 178/80 (!) 170/82  Pulse: 65 61 67 (!) 58  Resp: (!) '21 16 16 16  '$ Temp: 98.1 F (36.7 C) 97.8 F (36.6 C) 98.3 F (36.8 C) 97.9 F (36.6 C)  TempSrc: Oral Oral Oral Oral  SpO2: 99% 98% 100% 97%  Weight:   62.5 kg   Height:        Intake/Output Summary (Last 24 hours) at 08/02/2018 1447 Last data filed at 08/02/2018 1332 Gross per 24 hour  Intake 1034.77 ml  Output 1525 ml  Net -490.23 ml   Filed Weights   07/30/18 1556 07/31/18 2100 08/01/18 2106  Weight: 61.6 kg 62.5 kg 62.5 kg    Examination:   General exam: Pleasant you male moderately built and nourished lying comfortably propped up in bed.  Oral mucosa moist. Respiratory system: Clear to auscultation. No increased work of breathing.  Stable Cardiovascular system: S1, S2 regular rhythm and rate. No JVD or pedal edema.  Stable  Gastrointestinal system: Bowel sounds present, soft nontender nondistended.   Central nervous system: Alert.  No  focal neurological deficits.  No asterixis.  Appears to be in good spirits daily. Extremities: Symmetric 5 x 5 power.  Data Reviewed: I have personally reviewed following labs and imaging studies  CBC: Recent Labs  Lab 07/27/18 0317 07/28/18 0948 07/29/18 0431 08/01/18 0552 08/02/18 0340  WBC 6.9 5.4 5.5 6.0 7.4  HGB 7.4* 7.6* 7.6* 7.4* 8.5*  HCT 21.4* 22.5* 23.1* 22.4* 25.6*  MCV 87.3 88.9 88.5 89.2 89.5  PLT 127* 115* 106* 96* 924*   Basic Metabolic Panel: Recent Labs  Lab 07/29/18 0431 07/30/18 0524 07/31/18 0537 08/01/18 0552 08/02/18 0340  NA 138 137 139 138 138  K 4.0 4.0 3.9 3.8 4.2  CL 104 102 102 102 103  CO2 '22 23 25 22 23  '$ GLUCOSE 113* 136* 138* 179* 171*  BUN 122* 123* 83* 104* 113*  CREATININE 6.89* 6.91* 5.35* 6.78* 6.91*  CALCIUM 8.9 8.7* 8.8* 8.7* 9.0  PHOS 6.1* 6.0* 5.7* 5.8* 6.5*   GFR: Estimated Creatinine Clearance: 10.3 mL/min (A) (by C-G formula based on SCr of 6.91 mg/dL (H)). Liver Function Tests: Recent Labs  Lab 07/29/18 0431 07/30/18 0524 07/31/18 0537 08/01/18 0552 08/02/18 0340  ALBUMIN 2.9* 2.8* 2.7* 2.9* 2.9*   Coagulation  Profile: No results for input(s): INR, PROTIME in the last 168 hours.  Radiology Studies: No results found.      Scheduled Meds: . amLODipine  10 mg Oral Daily  . Chlorhexidine Gluconate Cloth  6 each Topical Q0600  . cyclophosphamide  100 mg Oral Daily  . darbepoetin (ARANESP) injection - NON-DIALYSIS  200 mcg Subcutaneous Q Sun-1800  . doxazosin  4 mg Oral Q12H  . famotidine  20 mg Oral Daily  . folic acid  1 mg Oral Daily  . multivitamin  1 tablet Oral QHS  . predniSONE  60 mg Oral Q breakfast  . senna-docusate  1 tablet Oral BID  . sodium bicarbonate  650 mg Oral BID  . spironolactone  25 mg Oral BID  . thiamine  100 mg Oral Daily   Continuous Infusions:    LOS: 17 days   Vernell Leep, MD, FACP, [facility]. [facility]  To contact the attending provider between 7A-7P or the  covering provider during after hours 7P-7A, please log into the web site [email or web address] and access using universal [place] password for that web site. If you do not have the password, please call the hospital operator.

## 2018-08-03 LAB — RENAL FUNCTION PANEL
ANION GAP: 13 (ref 5–15)
Albumin: 2.9 g/dL — ABNORMAL LOW (ref 3.5–5.0)
BUN: 118 mg/dL — ABNORMAL HIGH (ref 6–20)
CO2: 21 mmol/L — ABNORMAL LOW (ref 22–32)
Calcium: 9.2 mg/dL (ref 8.9–10.3)
Chloride: 104 mmol/L (ref 98–111)
Creatinine, Ser: 6.65 mg/dL — ABNORMAL HIGH (ref 0.61–1.24)
GFR, EST AFRICAN AMERICAN: 10 mL/min — AB (ref >60)
GFR, EST NON AFRICAN AMERICAN: 8 mL/min — AB (ref >60)
Glucose, Bld: 128 mg/dL — ABNORMAL HIGH (ref 70–99)
Phosphorus: 7.6 mg/dL — ABNORMAL HIGH (ref 2.5–4.6)
Potassium: 4.5 mmol/L (ref 3.5–5.1)
Sodium: 138 mmol/L (ref 135–145)

## 2018-08-03 NOTE — Progress Notes (Signed)
Subjective:  1900 of UOP - BP not low -  BUN and creatinine kind of stable- crt down some - not uremic  Objective Vital signs in last 24 hours: Vitals:   08/02/18 0429 08/02/18 2042 08/03/18 0815 08/03/18 0913  BP: (!) 170/82 (!) 179/82 (!) 150/74 (!) 162/83  Pulse: (!) 58 (!) 59 70 63  Resp: 16 16  18   Temp: 97.9 F (36.6 C) (!) 97.2 F (36.2 C)  98 F (36.7 C)  TempSrc: Oral Oral  Oral  SpO2: 97% 98%  100%  Weight:  61.7 kg    Height:       Weight change: -0.771 kg  Intake/Output Summary (Last 24 hours) at 08/03/2018 1028 Last data filed at 08/03/2018 0800 Gross per 24 hour  Intake 600 ml  Output 1990 ml  Net -1390 ml    Assessment/ Plan: Pt is a 58 y.o. 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   started on prednisone 60 on 3/9 and on cytoxan 50 on 3/13- I increased to 100 mg daily on 3/16.  Kidney function had stayed poor but relatively stable -  Since BUN well over 100 I decided to support with HD- s/p one treatment 3/19 - unfortunately kidney function worse the first day off of HD but more stable today.  Watching off of HD over weekend - - will CLIP for OP spot as AKI just in case he needs.  If needs to continue HD will likely need to stay  until OP HD arranged.  I am hoping and praying that his renal function will improve so that he wont need HD long term  - crt a little down today ? If that continues may be able to follow as OP ?   2. Anemia- hgb in the 7's- will likely not increase much in the face of cytoxan- iron  Low,s/p feraheme times 2 and also on darbe   3. Elytes- K, calc and bicarb OK-decreased sodium bicarb supplement- phos up- will add  renvela-  4. HTN/volume- BP high again -  on steroids - good UOP no lasix - norvasc 10 and cardura 8 - added aldactone with good results - increased on 3/20 - now 25 BID  Tatelyn Vanhecke A Rayce Brahmbhatt    Labs: Basic Metabolic Panel: Recent Labs  Lab 08/01/18 0552 08/02/18 0340 08/03/18 0442  NA 138 138 138  K 3.8 4.2 4.5  CL 102 103 104  CO2 22 23 21*  GLUCOSE 179* 171* 128*  BUN 104* 113* 118*  CREATININE 6.78* 6.91* 6.65*  CALCIUM 8.7* 9.0 9.2  PHOS 5.8* 6.5* 7.6*   Liver Function Tests: Recent Labs  Lab 08/01/18 0552 08/02/18 0340 08/03/18 0442  ALBUMIN 2.9* 2.9* 2.9*   No results for input(s): LIPASE, AMYLASE in the last 168 hours. No results for input(s): AMMONIA in the last 168 hours. CBC: Recent Labs  Lab 07/28/18 0948 07/29/18 0431 08/01/18 0552 08/02/18 0340  WBC 5.4 5.5 6.0 7.4  HGB 7.6* 7.6* 7.4* 8.5*  HCT 22.5* 23.1* 22.4* 25.6*  MCV 88.9 88.5 89.2 89.5  PLT 115* 106* 96* 121*   Cardiac Enzymes: No results for input(s): CKTOTAL, CKMB,  CKMBINDEX, TROPONINI in the last 168 hours. CBG: No results for input(s): GLUCAP in the last 168 hours.  Iron Studies: No results for input(s): IRON, TIBC, TRANSFERRIN, FERRITIN in the last 72 hours. Studies/Results: No results found. Medications: Infusions:   Scheduled Medications: . amLODipine  10 mg Oral Daily  . Chlorhexidine Gluconate Cloth  6 each Topical Q0600  . cyclophosphamide  100 mg Oral Daily  . darbepoetin (ARANESP) injection - NON-DIALYSIS  200 mcg Subcutaneous Q Sun-1800  . doxazosin  4 mg Oral Q12H  . famotidine  20 mg Oral Daily  . folic acid  1 mg Oral Daily  . multivitamin  1 tablet Oral QHS  . predniSONE  60 mg Oral Q breakfast  . senna-docusate  1 tablet Oral BID  . sodium bicarbonate  650 mg Oral BID  . spironolactone  25 mg Oral BID  . thiamine  100 mg Oral Daily    have reviewed scheduled and prn medications.  Physical Exam: General: alert, looks good- not uremic  Heart:  RRR Lungs: clear Abdomen: soft, non tender Extremities: no edema Dialysis Access: right ij TDC     08/03/2018,10:28 AM  LOS: 18 days

## 2018-08-03 NOTE — Progress Notes (Signed)
PROGRESS NOTE    Jeremy Grant  PPI:[id number] DOB: 12/17/1960 DOA: 07/16/2018 PCP: Hayden Rasmussen, MD    Brief Narrative:  58 year old with past medical history significant for hypertension, ethanol abuse, nonischemic cardiomyopathy, had a ejection fraction 20% with grade 3 diastolic dysfunction in 0630, thankfully normalized with normal repeated echo in 2017 and repeated echo this admission with normal ejection fraction presented with new onset anemia and acute kidney injury. He was seen by his PCP in the office who referred him for admission. Patient hemoglobin was at 5 and a creatinine at 5.  He was admitted for Acute kidney injury and anemia.  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  There was also some concern with hydralazine causing Glomerulonephritis due to positive antihistone antibody. He also had mild spike of M protein on SPEP UPEP.  Oncology was consulted and underwent a bone marrow biopsy with no evidence of multiple myeloma or other significant abnormalities.  Assessment & Plan:   Principal Problem:   Acute kidney failure ([place]) Active Problems:   Malignant hypertension   NICM (nonischemic cardiomyopathy) (HCC)   History of ETOH abuse   Chronic combined systolic and diastolic CHF (congestive heart failure) (HCC)   Normocytic normochromic anemia   Pancytopenia (HCC)   Multiple myeloma (HCC)   Elevated serum protein level   Malnutrition of moderate degree   Estimated body mass index is 17.94 kg/m as calculated from the following:   Height as of this encounter: '6\' 1"'$  (1.854 m).   Weight as of this encounter: 61.7 kg.  Acute kidney injury Creatinine 1.32 in December 2017.  Presented with creatinine of 6.23. Nephrology consulted and assisted with evaluation and management. 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 Hydralazine has been discontinued. He received  IV Solu-Medrol that was changed to 60 mg of prednisone daily. He also has an M spike on SPEP UPEP.  Skeletal bone survey negative.  Oncology  consulted. S/P tunneled dialysis catheter placement. Started on cytoxan along with prednisone.  Urine out put good 1.8 L Cr steadily increasing for the last 3 days 6.26 > 6.89 > 6.9 Since BUN 123 and creatinine 6.91 on 3/18, patient underwent a 2-hour HD on 3/18 and nephrology plans to observe with daily BMP.  If renal functions remain stable or improve, he could go home off of HD but if worsened then will need continued HD.  Nephrology have initiated clip process just in case there is no recovery.   Since last HD on 3/18, creatinine had gradually gone up to 6.9, slightly better today at 6.6.  Watchful waiting and if creatinine continues to improve or stabilize, DC home with outpatient follow-up.  Pancytopenia Present with a hemoglobin at 4 on admission.  He received 3 units of packed red blood cell.  He has also received IV iron. SPEP UPEP with spike M. Oncology was consulted.  Patient underwent Bone Marrow biopsy 3/12 which showed mildly hypercellular marrow with trilineage hematopoiesis and polytypic plasmacytosis.  The findings of the marrow are nonspecific and there is no definitive features to suggest hemopoietic neoplasm, correlation with cytogenetics was recommended. As per oncology follow-up, bone marrow shows no evidence of multiple myeloma or anything else concerning from a hematological standpoint and they plan to follow outpatient in 4 to 6 weeks with repeat myeloma panel. Leukopenia resolved.  Hemoglobin and platelets better today.  Unclear etiology.?  Related to medications.  No bleeding reported.  Continue to follow CBCs periodically.   Acute blood loss anemia:  After biopsy suspect hematoma post biopsy. Received 2 units PRBC 3-11. Started on aranesp/  He  received IV iron for 5 days. Hemoglobin up to 8.5.  History of nonischemic cardiomyopathy:  Normalized by echo 2017.  Monitor. Repeated ECHO with normal EF 3-12.  HTN;  Unable to use hydralazine due to concern for hydralazine causing glomerulonephritis.  Unable to add ACEI/ARB due to acute renal failure and BB/Clonidine d/t bradycardia.  Nephrology added Aldactone. Currently on amlodipine 10 mg daily, doxazosin 4 mg twice daily and Aldactone 25 mg twice daily.  Blood pressures mildly uncontrolled and fluctuating.     DVT prophylaxis: SCDs Code Status: Full code Family Communication: None at bedside. Disposition Plan: DC home pending improvement/stability of renal insufficiency.  Consultants:   Nephrology  Oncology  IR  Procedures:  Renal biopsy 3/10 HD 3/18   Antimicrobials:   None   Subjective: Patient remains without complaints.  Objective: Vitals:   08/02/18 0429 08/02/18 2042 08/03/18 0815 08/03/18 0913  BP: (!) 170/82 (!) 179/82 (!) 150/74 (!) 162/83  Pulse: (!) 58 (!) 59 70 63  Resp: '16 16  18  '$ Temp: 97.9 F (36.6 C) (!) 97.2 F (36.2 C)  98 F (36.7 C)  TempSrc: Oral Oral  Oral  SpO2: 97% 98%  100%  Weight:  61.7 kg    Height:        Intake/Output Summary (Last 24 hours) at 08/03/2018 1338 Last data filed at 08/03/2018 0800 Gross per 24 hour  Intake 300 ml  Output 1590 ml  Net -1290 ml   Filed Weights   07/31/18 2100 08/01/18 2106 08/02/18 2042  Weight: 62.5 kg 62.5 kg 61.7 kg    Examination: No significant change in clinical exam over the last couple days.  General exam: Pleasant you male moderately built and nourished lying comfortably propped up in bed.  Oral mucosa moist. Respiratory system: Clear to auscultation. No increased work of breathing.  Stable Cardiovascular system: S1, S2 regular rhythm and rate. No JVD or pedal edema.  Stable  Gastrointestinal system: Bowel sounds present, soft nontender nondistended.   Central nervous  system: Alert.  No focal neurological deficits.  No asterixis.  Appears to be in good spirits daily. Extremities: Symmetric 5 x 5 power.  Data Reviewed: I have personally reviewed following labs and imaging studies  CBC: Recent Labs  Lab 07/28/18 0948 07/29/18 0431 08/01/18 0552 08/02/18 0340  WBC 5.4 5.5 6.0 7.4  HGB 7.6* 7.6* 7.4* 8.5*  HCT 22.5* 23.1* 22.4* 25.6*  MCV 88.9 88.5 89.2 89.5  PLT 115* 106* 96* 938*   Basic Metabolic Panel: Recent Labs  Lab 07/30/18 0524 07/31/18 0537 08/01/18 0552 08/02/18 0340 08/03/18 0442  NA 137 139 138 138 138  K 4.0 3.9 3.8 4.2 4.5  CL 102 102 102 103 104  CO2 '23 25 22 23 '$ 21*  GLUCOSE 136* 138* 179* 171* 128*  BUN 123* 83* 104* 113* 118*  CREATININE 6.91* 5.35* 6.78* 6.91* 6.65*  CALCIUM 8.7* 8.8* 8.7* 9.0 9.2  PHOS 6.0* 5.7* 5.8* 6.5* 7.6*   GFR: Estimated Creatinine Clearance: 10.6 mL/min (A) (by C-G formula based on SCr of 6.65 mg/dL (H)). Liver Function Tests: Recent Labs  Lab 07/30/18 0524 07/31/18 0537 08/01/18 0552 08/02/18 0340 08/03/18 0442  ALBUMIN 2.8* 2.7* 2.9* 2.9* 2.9*   Coagulation Profile: No results for input(s): INR,  PROTIME in the last 168 hours.  Radiology Studies: No results found.      Scheduled Meds: . amLODipine  10 mg Oral Daily  . cyclophosphamide  100 mg Oral Daily  . darbepoetin (ARANESP) injection - NON-DIALYSIS  200 mcg Subcutaneous Q Sun-1800  . doxazosin  4 mg Oral Q12H  . famotidine  20 mg Oral Daily  . folic acid  1 mg Oral Daily  . multivitamin  1 tablet Oral QHS  . predniSONE  60 mg Oral Q breakfast  . senna-docusate  1 tablet Oral BID  . sodium bicarbonate  650 mg Oral BID  . spironolactone  25 mg Oral BID  . thiamine  100 mg Oral Daily   Continuous Infusions:    LOS: 18 days   Vernell Leep, MD, FACP, [facility]  To contact the attending provider between 7A-7P or the covering provider during after hours 7P-7A, please log into the web site  [email or web address] and access using universal [place] password for that web site. If you do not have the password, please call the hospital operator.

## 2018-08-04 ENCOUNTER — Encounter (HOSPITAL_COMMUNITY): Payer: Self-pay | Admitting: Internal Medicine

## 2018-08-04 LAB — RENAL FUNCTION PANEL
Albumin: 3 g/dL — ABNORMAL LOW (ref 3.5–5.0)
Anion gap: 13 (ref 5–15)
BUN: 121 mg/dL — ABNORMAL HIGH (ref 6–20)
CO2: 20 mmol/L — ABNORMAL LOW (ref 22–32)
Calcium: 8.9 mg/dL (ref 8.9–10.3)
Chloride: 104 mmol/L (ref 98–111)
Creatinine, Ser: 6.87 mg/dL — ABNORMAL HIGH (ref 0.61–1.24)
GFR calc Af Amer: 9 mL/min — ABNORMAL LOW (ref >60)
GFR calc non Af Amer: 8 mL/min — ABNORMAL LOW (ref >60)
GLUCOSE: 142 mg/dL — AB (ref 70–99)
Phosphorus: 7.4 mg/dL — ABNORMAL HIGH (ref 2.5–4.6)
Potassium: 4.9 mmol/L (ref 3.5–5.1)
Sodium: 137 mmol/L (ref 135–145)

## 2018-08-04 LAB — CBC
HCT: 26.6 % — ABNORMAL LOW (ref 39.0–52.0)
HEMOGLOBIN: 8.8 g/dL — AB (ref 13.0–17.0)
MCH: 29.4 pg (ref 26.0–34.0)
MCHC: 33.1 g/dL (ref 30.0–36.0)
MCV: 89 fL (ref 80.0–100.0)
Platelets: 121 10*3/uL — ABNORMAL LOW (ref 150–400)
RBC: 2.99 MIL/uL — AB (ref 4.22–5.81)
RDW: 13.8 % (ref 11.5–15.5)
WBC: 6.8 10*3/uL (ref 4.0–10.5)
nRBC: 0 % (ref 0.0–0.2)

## 2018-08-04 MED ORDER — SPIRONOLACTONE 25 MG PO TABS: 25.0000 mg | ORAL_TABLET | Freq: Two times a day (BID) | ORAL | 0 refills | Status: DC

## 2018-08-04 MED ORDER — CYCLOPHOSPHAMIDE 50 MG PO CAPS: 50.0000 mg | ORAL_CAPSULE | Freq: Every day | ORAL | 0 refills | Status: DC

## 2018-08-04 MED ORDER — SODIUM BICARBONATE 650 MG PO TABS: 650.0000 mg | ORAL_TABLET | Freq: Two times a day (BID) | ORAL | 0 refills | Status: DC

## 2018-08-04 MED ORDER — FAMOTIDINE 20 MG PO TABS: 20.0000 mg | ORAL_TABLET | Freq: Every day | ORAL | 0 refills | Status: DC

## 2018-08-04 MED ORDER — FOLIC ACID 1 MG PO TABS: 1.0000 mg | ORAL_TABLET | Freq: Every day | ORAL | 0 refills | Status: DC

## 2018-08-04 MED ORDER — HEPARIN SODIUM (PORCINE) 1000 UNIT/ML IJ SOLN
INTRAMUSCULAR | Status: AC
  Filled 2018-08-04: qty 4

## 2018-08-04 MED ORDER — RENA-VITE PO TABS: 1.0000 | ORAL_TABLET | Freq: Every day | ORAL | 0 refills | Status: DC

## 2018-08-04 MED ORDER — CYCLOPHOSPHAMIDE 50 MG PO CAPS: 100.0000 mg | ORAL_CAPSULE | Freq: Every day | ORAL | 0 refills | Status: DC

## 2018-08-04 MED ORDER — THIAMINE HCL 100 MG PO TABS: 100.0000 mg | ORAL_TABLET | Freq: Every day | ORAL | 0 refills | Status: DC

## 2018-08-04 MED ORDER — HEPARIN SODIUM (PORCINE) 1000 UNIT/ML IJ SOLN
3.2000 mL | Freq: Once | INTRAMUSCULAR | Status: AC
  Administered 2018-08-04: 3200 [IU] via INTRAVENOUS

## 2018-08-04 MED ORDER — DOXAZOSIN MESYLATE 4 MG PO TABS: 4.0000 mg | ORAL_TABLET | Freq: Two times a day (BID) | ORAL | 0 refills | Status: DC

## 2018-08-04 MED ORDER — PREDNISONE 20 MG PO TABS: 60.0000 mg | ORAL_TABLET | Freq: Every day | ORAL | 0 refills | Status: DC

## 2018-08-04 MED ORDER — AMLODIPINE BESYLATE 10 MG PO TABS: 10.0000 mg | ORAL_TABLET | Freq: Every day | ORAL | 0 refills | Status: DC

## 2018-08-04 NOTE — Progress Notes (Signed)
Nutrition Follow-up  DOCUMENTATION CODES:   Non-severe (moderate) malnutrition in context of acute illness/injury  INTERVENTION:  Continue renal MVI daily Continue double portion with meals   NUTRITION DIAGNOSIS:   Moderate Malnutrition related to acute illness(acute kidney injury) as evidenced by mild fat depletion, mild muscle depletion, percent weight loss.  Ongoing  GOAL:   Patient will meet greater than or equal to 90% of their needs   Progressing, 100% of last 2 meals per patient report  MONITOR:   PO intake, Supplement acceptance, Labs, Weight trends, Skin, I & O's  REASON FOR ASSESSMENT:   LOS    ASSESSMENT:  Jeremy Grant is a 58 y.o. male with medical history significant of HTN, EtOH abuse, NICM had EF 20% with grade 3 diastolic dysfunction back in 2015, this thankfully normalized with normal repeat echos in 2015 and 2017. Patient presents today with new onset anemia and renal failure.  He was seen by Dr. Darron Doom, primary care physician, in her office yesterday.  Patient reports feeling great this morning and stated that he will be going home today. Patient stated that he ate 100% of his breakfast today and dinner last night. Patient recalls following a renal diet when at home and wife prepares meals. RD encouraged continued good PO intake at home. Patient asking for resources for nutrition at home, RD suggested NIDDK web site for home nutrition/dietary guidance.   Per chart review on 3/19: pt and wife report, experienced approximately a 7.6% wt loss x 1 month, which is significant for time frame, however, no documented wt hx to confirm this). RD informed pt of continued plan to provide double protein portion at meals to assist with satiety and help preserve lean body mass.  I/Os reviewed: -775m x 24 hr   -22.6L  since 3/9 UOP x 1.4L x 24 hr  3/10- s/p renal biopsy (consistent with vasculitis with cellular crescents and minimal scar tissue) 3/12- s/p CT bone  marrow biopsy and aspiration (to rule out multiple myeloma) 3/13- s/p rt HD catheter placement 3/17- cytoxan initiated 3/18- first HD session   Diet Order:  63% x 8 recorded meals Diet Order            Diet renal with fluid restriction Fluid restriction: 1200 mL Fluid; Room service appropriate? Yes; Fluid consistency: Thin  Diet effective now              EDUCATION NEEDS:   Education needs have been addressed  Skin:  Skin Assessment: Reviewed RN Assessment  Last BM:  07/30/18  Height:   Ht Readings from Last 1 Encounters:  07/23/18 '6\' 1"'$  (1.854 m)    Weight:   Wt Readings from Last 1 Encounters:  08/02/18 61.7 kg    Ideal Body Weight:  83.6 kg  BMI:  Body mass index is 17.94 kg/m.  Estimated Nutritional Needs:   Kcal:  [address] Protein:  95-110 grams  Fluid:  1000 ml + UOP    Jeremy Grant RD, LDN  After Hours/Weekend Pager: [phone]

## 2018-08-04 NOTE — Progress Notes (Addendum)
[facility] Progress Note    Assessment/ Plan:   58 y.o. 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   started on prednisone 60 on 3/9 and on cytoxan 50 on 3/13;  increased to 100 mg daily on 3/16.  Kidney function had stayed poor but relatively stable -  Since BUN well over 100 I decided to support with HD- s/p one treatment 3/19 - unfortunately kidney function worse the first day off of HD somewhat stable.    - Renal function is fairly stable but certainly concerning as rising BUN but this may also be from the Prednisone. - Pt is feeling very well with no uremic signs and his UOP is great. - Will schedule for televisit w/ Dr. Moshe Cipro for April 2nd at 1045AM; given that it's almost 1-1/2 weeks out I am also having [facility] check a BMET on Thursday or Fri of this week. - Normally would decrease the CTX to 50mg  PO daily (dosed at 0.8mg /kg) given he still has compromised renal function but with similar renal function CTX was incr from 50 -> 100mg  on 3/16 and he has tolerated with fairly stable counts.   - Pt needs to call [facility] [phone] upon d/c to get info for the lab draws and telemed visit.  - Would leave the catheter in just in case he needs to cont HD; will need weekly flushes which we will take care of at [facility]. Will also have HD nurse come down to flush before he leaves.   2. Anemia- hgb in the 7's- will likely not increase much in the face of cytoxan- iron  Low,s/p feraheme times 2 and also on darbe   3. Elytes- K, calc and bicarb OK-decreased sodium bicarb supplement- phos up- will add renvela-  4. HTN/volume- BP high again -  on steroids - good UOP no lasix  - norvasc 10 and cardura 8 - added aldactone with good results - increased on 3/20 - now 25 BID  Subjective:   Feels much better; denies n/v/d/f/c/dyspnea. Great UOP; wants to go home.   Objective:   BP (!) 167/80 (BP Location: Right Arm)   Pulse 64   Temp 98 F (36.7 C) (Oral)   Resp 18   Ht 6\' 1"  (1.854 m)   Wt 61.7 kg   SpO2 99%   BMI 17.94 kg/m   Intake/Output Summary (Last 24 hours) at 08/04/2018 1112 Last data filed at 08/04/2018 0900 Gross per 24 hour  Intake 1090 ml  Output 1550 ml  Net -460 ml   Weight change:   Physical Exam: General: alert, looks good- not uremic  Heart: RRR Lungs: clear Abdomen: soft, non tender Extremities: no edema Dialysis Access: right ij TDC  Imaging: No results found.  Labs: BMET Recent Labs  Lab 07/29/18 0431 07/30/18 0524 07/31/18 0537 08/01/18 0552 08/02/18 0340 08/03/18 0442 08/04/18 0305  NA 138 137 139 138 138 138 137  K 4.0 4.0 3.9 3.8 4.2 4.5 4.9  CL 104 102 102 102 103 104 104  CO2 22 23 25 22 23  21* 20*  GLUCOSE 113* 136* 138* 179* 171* 128* 142*  BUN 122* 123* 83* 104* 113*  118* 121*  CREATININE 6.89* 6.91* 5.35* 6.78* 6.91* 6.65* 6.87*  CALCIUM 8.9 8.7* 8.8* 8.7* 9.0 9.2 8.9  PHOS 6.1* 6.0* 5.7* 5.8* 6.5* 7.6* 7.4*   CBC Recent Labs  Lab 07/29/18 0431 08/01/18 0552 08/02/18 0340 08/04/18 0305  WBC 5.5 6.0 7.4 6.8  HGB 7.6* 7.4* 8.5* 8.8*  HCT 23.1* 22.4* 25.6* 26.6*  MCV 88.5 89.2 89.5 89.0  PLT 106* 96* 121* 121*    Medications:    . amLODipine  10 mg Oral Daily  . cyclophosphamide  100 mg Oral Daily  . darbepoetin (ARANESP) injection - NON-DIALYSIS  200 mcg Subcutaneous Q Sun-1800  . doxazosin  4 mg Oral Q12H  . famotidine  20 mg Oral Daily  . folic acid  1 mg Oral Daily  . multivitamin  1 tablet Oral QHS  . predniSONE  60 mg Oral Q breakfast  . senna-docusate  1 tablet Oral BID  . sodium bicarbonate  650 mg Oral BID  . spironolactone  25 mg Oral BID  . thiamine  100 mg Oral Daily       Otelia Santee, MD 08/04/2018, 11:12 AM

## 2018-08-04 NOTE — Discharge Summary (Signed)
Physician Discharge Summary  Jeremy Grant HBZ:[id number] DOB: 09-Aug-1960  PCP: Hayden Rasmussen, MD  Admit date: 07/16/2018 Discharge date: 08/04/2018  Recommendations for Outpatient Follow-up:  1. Dr. Horald Pollen, PCP 2. Dr. Vanetta Mulders, [facility]: Office will arrange telemetry visit with Dr. Moshe Cipro for 08/14/2018 at 10:45 AM.  Office will also arrange lab work (CBC & BMP) on 3/25 or 3/26. 3. Dr. Curt Bears, Hematology/oncology on 09/11/2018 at 9:15 AM. 4. Recommend repeating urine microscopy in a couple of weeks to reassess for microscopic hematuria. 5. Patient will be discharging with tunneled HD catheter.  Home Health: None Equipment/Devices: None  Discharge Condition: Improved and stable CODE STATUS: Full Diet recommendation: Heart healthy diet.  Discharge Diagnoses:  Principal Problem:   Acute kidney failure ([place]) Active Problems:   Malignant hypertension   NICM (nonischemic cardiomyopathy) (HCC)   History of ETOH abuse   Chronic combined systolic and diastolic CHF (congestive heart failure) (HCC)   Normocytic normochromic anemia   Pancytopenia (HCC)   Multiple myeloma (HCC)   Elevated serum protein level   Malnutrition of moderate degree   Brief Summary: 58 year old with past medical history significant for hypertension, ethanol abuse, nonischemic cardiomyopathy, had a ejection fraction 20% with grade 3 diastolic dysfunction in 1017, thankfully normalized with normal repeated echo in 2017 and repeated echo this admission with normal ejection fraction, presented with new onset anemia and acute kidney injury. He was seen by his PCP in the office who referred him for admission. Patient hemoglobin was at 5 and a creatinine at 5.  He was admitted for Acute kidney injury and anemia.  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  There was also some concern with hydralazine causing  Glomerulonephritis due to positive antihistone antibody. He also had mild spike of M protein on SPEP UPEP.  Oncology was consulted and underwent a bone marrow biopsy with no evidence of multiple myeloma or other significant abnormalities.  Assessment & Plan:  Acute kidney injury Creatinine 1.32 in December 2017.  Presented with creatinine of 6.23. Nephrology consulted and assisted with evaluation and management. 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 Hydralazine has been discontinued. He received  IV Solu-Medrol that was changed to 60 mg of prednisone daily. He also has an M spike on SPEP UPEP.  Skeletal bone survey negative.  Oncology  consulted and bone marrow biopsy unremarkable S/P tunneled dialysis catheter placement. Started on cytoxan along with prednisone.  As per nephrology, continue current dose of prednisone 60 mg daily & cyclophosphamide 100 mg daily at discharge. Nonoliguric at this time. Since BUN 123 and creatinine 6.91 on 3/18, patient underwent a 2-hour HD on 3/18 Postdialysis, creatinine has been fairly stable for the last 3 to 4 days.  I discussed extensively with nephrology today who have seen him, no uremic signs or symptoms, good urine output, have cleared him for discharge home with close outpatient follow-up with labs and telemetry visit, recommend leaving HD catheter just in case he needs to continue HD.  Nephrology will take care of weekly HD catheter flushes.  Pancytopenia Present with a hemoglobin at 4 on admission.  He received 3 units of packed red blood cell.  He has also received IV iron. SPEP UPEP with spike M. Oncology was consulted.  Patient underwent Bone Marrow biopsy 3/12 which showed mildly hypercellular marrow with trilineage hematopoiesis and polytypic plasmacytosis.  The findings of the marrow are nonspecific and there is no  definitive features to suggest hemopoietic neoplasm, correlation with cytogenetics was recommended. As per oncology follow-up, bone marrow shows no evidence of multiple myeloma or anything else concerning from a hematological standpoint and they plan to follow outpatient in 4 to 6 weeks with repeat myeloma panel. Leukopenia resolved.  Anemia and thrombocytopenia stable.?  Related to renal insufficiency and cyclophosphamide.  Follow CBC as outpatient (discussed with nephrology).   Acute blood loss anemia:  After biopsy suspect hematoma post biopsy. Received 2 units PRBC 3-11. Started on aranesp/  He received IV iron for 5 days. Hemoglobin stable in the 8 g range.  History of nonischemic cardiomyopathy:  Normalized by echo 2017.  Monitor. Repeated ECHO with normal EF 3-12.  HTN;  Unable to use hydralazine due to concern for hydralazine causing glomerulonephritis.  Unable to add ACEI/ARB due to acute renal failure and BB/Clonidine d/t bradycardia.  Nephrology added Aldactone. Currently on amlodipine 10 mg daily, doxazosin 4 mg twice daily and Aldactone 25 mg twice daily.  Blood pressures mildly uncontrolled and fluctuating. Continue current regimen at discharge and close outpatient follow-up.  Microscopic hematuria Unclear etiology.  Recommend repeating urine microscopy in a couple of weeks and if persists then may need further evaluation.  Please note patient now is on cyclophosphamide.   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  Diagnosis Bone Marrow, Aspirate,Biopsy, and Clot, right iliac BONE MARROW: - MILDLY HYPERCELLULAR MARROW WITH TRILINEAGE HEMATOPOIESIS AND POLYTYPIC PLASMACYTOSIS (5-9%) - SEE  COMMENT PERIPHERAL BLOOD: - NORMOCYTIC ANEMIA AND THROMBOCYTOPENIA - SEE COMPLETE BLOOD COUNT Diagnosis Note The findings in the marrow are non-specific. There are no definitive features to suggest a hematopoetic neoplasm; correlation with cytogenetics is recommended.  Discharge Instructions  Discharge Instructions    (HEART FAILURE PATIENTS) Call MD:  Anytime you have any of the following symptoms: 1) 3 pound weight gain in 24 hours or 5 pounds in 1 week 2) shortness of breath, with or without a dry hacking cough 3) swelling in the hands, feet or stomach 4) if you have to sleep on extra pillows at night in order to breathe.   Complete by:  As directed    Activity as tolerated - No restrictions   Complete by:  As directed    Call MD for:  difficulty breathing, headache or visual disturbances   Complete by:  As directed    Call MD for:  extreme fatigue   Complete by:  As directed    Call MD for:  persistant dizziness or light-headedness   Complete by:  As directed    Call MD for:  persistant nausea and vomiting   Complete by:  As directed    Call MD for:  redness, tenderness, or signs of infection (pain, swelling, redness, odor or green/yellow discharge around incision site)   Complete by:  As directed    Call MD for:  severe uncontrolled pain   Complete by:  As directed    Call MD for:  temperature >100.4   Complete by:  As directed    Diet - low sodium heart healthy   Complete by:  As directed        Medication List    STOP taking these medications  carvedilol 25 MG tablet Commonly known as:  COREG   furosemide 20 MG tablet Commonly known as:  LASIX   hydrALAZINE 100 MG tablet Commonly known as:  APRESOLINE   isosorbide mononitrate 30 MG 24 hr tablet Commonly known as:  IMDUR     TAKE these medications   amLODipine 10 MG tablet Commonly known as:  NORVASC Take 1 tablet (10 mg total) by mouth daily.   cyclophosphamide 50 MG capsule Commonly known as:   CYTOXAN Take 2 capsules (100 mg total) by mouth daily. Give on an empty stomach 1 hour before or 2 hours after meals. Start taking on:  August 05, 2018   doxazosin 4 MG tablet Commonly known as:  CARDURA Take 1 tablet (4 mg total) by mouth 2 (two) times daily.   famotidine 20 MG tablet Commonly known as:  PEPCID Take 1 tablet (20 mg total) by mouth daily. Start taking on:  August 04, 3472   folic acid 1 MG tablet Commonly known as:  FOLVITE Take 1 tablet (1 mg total) by mouth daily.   multivitamin Tabs tablet Take 1 tablet by mouth at bedtime.   predniSONE 20 MG tablet Commonly known as:  DELTASONE Take 3 tablets (60 mg total) by mouth daily with breakfast. Start taking on:  August 05, 2018   sodium bicarbonate 650 MG tablet Take 1 tablet (650 mg total) by mouth 2 (two) times daily.   spironolactone 25 MG tablet Commonly known as:  ALDACTONE Take 1 tablet (25 mg total) by mouth 2 (two) times daily.   thiamine 100 MG tablet Take 1 tablet (100 mg total) by mouth daily.      Follow-up Information    Hayden Rasmussen, MD. Schedule an appointment as soon as possible for a visit.   Specialty:  Family Medicine Contact information: [place] [address] [phone]        Corliss Parish, MD Follow up.   Specialty:  Nephrology Why:  Please call upon discharge to get info for the lab draws (will be arranged for Thursday or Friday of this week) and telemed visit with MD. Contact information: [place] [address] [phone]        Curt Bears, MD Follow up on 09/11/2018.   Specialty:  Oncology Why:  9:15 AM. Contact information: [address] [place] [address] [phone]          Allergies  Allergen Reactions  . Hydralazine Hcl       Procedures/Studies: US Renal  Result Date: 07/17/2018 CLINICAL DATA:  Acute renal failure EXAM: RENAL / URINARY TRACT ULTRASOUND COMPLETE COMPARISON:   None. FINDINGS: Right Kidney: Renal measurements: 12.5 x 5.2 x 5.6 cm = volume: 191.9 mL. Mild increased renal cortical echogenicity. No hydronephrosis. Left Kidney: Renal measurements: 13.2 x 6.2 x 6.6 cm = volume: 281.2 mL. Mild increased renal cortical echogenicity. No hydronephrosis. Bladder: Appears normal for degree of bladder distention. IMPRESSION: No hydronephrosis. Mild increased renal cortical echogenicity as can be seen with chronic medical renal disease. Electronically Signed   By: Lovey Newcomer M.D.   On: 07/17/2018 09:29   Ir Fluoro Guide Cv Line Right  Result Date: 07/25/2018 INDICATION: 58 year old male with acute kidney disease in need of hemodialysis. EXAM: TUNNELED CENTRAL VENOUS HEMODIALYSIS CATHETER PLACEMENT WITH ULTRASOUND AND FLUOROSCOPIC GUIDANCE MEDICATIONS: 2 g Ancef. The antibiotic was given in an appropriate time interval prior to skin puncture. ANESTHESIA/SEDATION: Moderate (conscious) sedation was employed during this  procedure. A total of Versed 1.5 mg and Fentanyl 75 mcg was administered intravenously. Moderate Sedation Time: 15 minutes. The patient's level of consciousness and vital signs were monitored continuously by radiology nursing throughout the procedure under my direct supervision. FLUOROSCOPY TIME:  Fluoroscopy Time: 0 minutes 12 seconds (0 mGy). COMPLICATIONS: None immediate. PROCEDURE: Informed written consent was obtained from the patient after a discussion of the risks, benefits, and alternatives to treatment. Questions regarding the procedure were encouraged and answered. The right neck and chest were prepped with chlorhexidine in a sterile fashion, and a sterile drape was applied covering the operative field. Maximum barrier sterile technique with sterile gowns and gloves were used for the procedure. A timeout was performed prior to the initiation of the procedure. After creating a small venotomy incision, a micropuncture kit was utilized to access the right  internal jugular vein under direct, real-time ultrasound guidance after the overlying soft tissues were anesthetized with 1% lidocaine with epinephrine. Ultrasound image documentation was performed. The microwire was kinked to measure appropriate catheter length. A stiff Glidewire was advanced to the level of the IVC and the micropuncture sheath was exchanged for a peel-away sheath. A palindrome tunneled hemodialysis catheter measuring 19 cm from tip to cuff was tunneled in a retrograde fashion from the anterior chest wall to the venotomy incision. The catheter was then placed through the peel-away sheath with tips ultimately positioned within the superior aspect of the right atrium. Final catheter positioning was confirmed and documented with a spot radiographic image. The catheter aspirates and flushes normally. The catheter was flushed with appropriate volume heparin dwells. The catheter exit site was secured with a 0-Prolene retention suture. The venotomy incision was closed with an interrupted 4-0 Vicryl, Dermabond and Steri-strips. Dressings were applied. The patient tolerated the procedure well without immediate post procedural complication. IMPRESSION: Successful placement of 19 cm tip to cuff tunneled hemodialysis catheter via the right internal jugular vein with tips terminating within the superior aspect of the right atrium. The catheter is ready for immediate use. Electronically Signed   By: Jacqulynn Cadet M.D.   On: 07/25/2018 15:49   Ir US Guide Vasc Access Right  Result Date: 07/25/2018 INDICATION: 58 year old male with acute kidney disease in need of hemodialysis. EXAM: TUNNELED CENTRAL VENOUS HEMODIALYSIS CATHETER PLACEMENT WITH ULTRASOUND AND FLUOROSCOPIC GUIDANCE MEDICATIONS: 2 g Ancef. The antibiotic was given in an appropriate time interval prior to skin puncture. ANESTHESIA/SEDATION: Moderate (conscious) sedation was employed during this procedure. A total of Versed 1.5 mg and Fentanyl  75 mcg was administered intravenously. Moderate Sedation Time: 15 minutes. The patient's level of consciousness and vital signs were monitored continuously by radiology nursing throughout the procedure under my direct supervision. FLUOROSCOPY TIME:  Fluoroscopy Time: 0 minutes 12 seconds (0 mGy). COMPLICATIONS: None immediate. PROCEDURE: Informed written consent was obtained from the patient after a discussion of the risks, benefits, and alternatives to treatment. Questions regarding the procedure were encouraged and answered. The right neck and chest were prepped with chlorhexidine in a sterile fashion, and a sterile drape was applied covering the operative field. Maximum barrier sterile technique with sterile gowns and gloves were used for the procedure. A timeout was performed prior to the initiation of the procedure. After creating a small venotomy incision, a micropuncture kit was utilized to access the right internal jugular vein under direct, real-time ultrasound guidance after the overlying soft tissues were anesthetized with 1% lidocaine with epinephrine. Ultrasound image documentation was performed. The microwire was kinked to  measure appropriate catheter length. A stiff Glidewire was advanced to the level of the IVC and the micropuncture sheath was exchanged for a peel-away sheath. A palindrome tunneled hemodialysis catheter measuring 19 cm from tip to cuff was tunneled in a retrograde fashion from the anterior chest wall to the venotomy incision. The catheter was then placed through the peel-away sheath with tips ultimately positioned within the superior aspect of the right atrium. Final catheter positioning was confirmed and documented with a spot radiographic image. The catheter aspirates and flushes normally. The catheter was flushed with appropriate volume heparin dwells. The catheter exit site was secured with a 0-Prolene retention suture. The venotomy incision was closed with an interrupted 4-0  Vicryl, Dermabond and Steri-strips. Dressings were applied. The patient tolerated the procedure well without immediate post procedural complication. IMPRESSION: Successful placement of 19 cm tip to cuff tunneled hemodialysis catheter via the right internal jugular vein with tips terminating within the superior aspect of the right atrium. The catheter is ready for immediate use. Electronically Signed   By: Jacqulynn Cadet M.D.   On: 07/25/2018 15:49   Dg Chest Port 1 View  Result Date: 07/16/2018 CLINICAL DATA:  Weakness EXAM: PORTABLE CHEST 1 VIEW COMPARISON:  06/22/2015 FINDINGS: Cardiomegaly. There may be patchy ground-glass opacity in the left mid lung and right infrahilar lung. No pneumothorax. IMPRESSION: 1. Suspected ground-glass infiltrates in the left mid lung and right infrahilar lung. 2. Mild cardiomegaly Electronically Signed   By: Donavan Foil M.D.   On: 07/16/2018 21:20   Dg Bone Survey Met  Result Date: 07/22/2018 CLINICAL DATA:  Multiple myeloma EXAM: METASTATIC BONE SURVEY COMPARISON:  None. FINDINGS: No focal bony lesions are identified. Bony mineralization appears normal. IMPRESSION: No focal bony lesions. Electronically Signed   By: Margarette Canada M.D.   On: 07/22/2018 20:43   Ct Bone Marrow Biopsy & Aspiration  Result Date: 07/24/2018 INDICATION: 58 year old male with a history of multiple myeloma EXAM: CT BONE MARROW BIOPSY AND ASPIRATION MEDICATIONS: None. ANESTHESIA/SEDATION: Moderate (conscious) sedation was employed during this procedure. A total of Versed 1.5 mg and Fentanyl 75 mcg was administered intravenously. Moderate Sedation Time: 13 minutes. The patient's level of consciousness and vital signs were monitored continuously by radiology nursing throughout the procedure under my direct supervision. FLUOROSCOPY TIME:  CT COMPLICATIONS: None PROCEDURE: The procedure risks, benefits, and alternatives were explained to the patient. Questions regarding the procedure were encouraged  and answered. The patient understands and consents to the procedure. Scout CT of the pelvis was performed for surgical planning purposes. The posterior pelvis was prepped with Chlorhexidine in a sterile fashion, and a sterile drape was applied covering the operative field. A sterile gown and sterile gloves were used for the procedure. Local anesthesia was provided with 1% Lidocaine. Posterior iliac bone was targeted for biopsy. The skin and subcutaneous tissues were infiltrated with 1% lidocaine without epinephrine. A small stab incision was made with an 11 blade scalpel, and an 11 gauge Murphy needle was advanced with CT guidance to the posterior cortex. Manual forced was used to advance the needle through the posterior cortex and the stylet was removed. A bone marrow aspirate was retrieved and passed to a cytotechnologist in the room. The Murphy needle was then advanced without the stylet for a core biopsy. The core biopsy was retrieved and also passed to a cytotechnologist. Manual pressure was used for hemostasis and a sterile dressing was placed. No complications were encountered no significant blood loss was encountered. Patient  tolerated the procedure well and remained hemodynamically stable throughout. IMPRESSION: Status post CT-guided bone marrow biopsy, with tissue specimen sent to pathology for complete histopathologic analysis Signed, Dulcy Fanny. Earleen Newport, DO [facility] Electronically Signed   By: Corrie Mckusick D.O.   On: 07/24/2018 12:02   US Biopsy (kidney)  Result Date: 07/22/2018 INDICATION: 58 year old male with acute kidney injury and hypertension. He presents for ultrasound-guided renal biopsy. EXAM: ULTRASOUND GUIDED RENAL BIOPSY COMPARISON:  None. MEDICATIONS: Fentanyl 75 mcg IV; Versed 2 mg IV ANESTHESIA/SEDATION: Total Moderate Sedation time Twelve minutes COMPLICATIONS: None immediate PROCEDURE: Informed written consent was obtained  from the patient after a discussion of the risks, benefits and alternatives to treatment. The patient understands and consents the procedure. A timeout was performed prior to the initiation of the procedure. Ultrasound scanning was performed of the bilateral flanks. The inferior pole of the left kidney was selected for biopsy due to location and sonographic window. The procedure was planned. The operative site was prepped and draped in the usual sterile fashion. The overlying soft tissues were anesthetized with 1% lidocaine with epinephrine. A 17 gauge core needle biopsy device was advanced into the inferior cortex of the left kidney and 2 core biopsies were obtained under direct ultrasound guidance. Images were saved for documentation purposes. The biopsy device was removed and hemostasis was obtained with manual compression. Post procedural scanning was negative for significant post procedural hemorrhage or additional complication. There is a small perinephric hematoma about the lower pole. A dressing was placed. The patient tolerated the procedure well without immediate post procedural complication. IMPRESSION: Technically successful ultrasound guided left renal biopsy. Electronically Signed   By: Jacqulynn Cadet M.D.   On: 07/22/2018 18:07      Subjective: Patient denies complaints.  Anxious to go home.  Denies nausea, vomiting, diarrhea, headache, dyspnea, dizziness or lightheadedness.  Having good urine output.  Discharge Exam:  Vitals:   08/03/18 0815 08/03/18 0913 08/03/18 2100 08/04/18 0900  BP: (!) 150/74 (!) 162/83 (!) 172/84 (!) 167/80  Pulse: 70 63 68 64  Resp:  _0 Temp:  98 F (36.7 C) 98.4 F (36.9 C) 98 F (36.7 C)  TempSrc:  Oral Oral Oral  SpO2:  100% 98% 99%  Weight:      Height:        General exam: Pleasant you male moderately built and nourished seen ambulating comfortably in the room without distress or discomfort. Respiratory system: Clear to auscultation.  No increased work of breathing.  Cardiovascular system: S1, S2 regular rhythm and rate. No JVD or pedal edema. Gastrointestinal system:  Nondistended, soft and nontender.  No organomegaly or masses appreciated.  Normal bowel sounds heard.  Central nervous system:  Alert and oriented x3.  No focal neurological deficits.  No asterixis. Extremities: Symmetric 5 x 5 power.    The results of significant diagnostics from this hospitalization (including imaging, microbiology, ancillary and laboratory) are listed below for reference.      Labs: CBC: Recent Labs  Lab 07/29/18 0431 08/01/18 0552 08/02/18 0340 08/04/18 0305  WBC 5.5 6.0 7.4 6.8  HGB 7.6* 7.4* 8.5* 8.8*  HCT 23.1* 22.4* 25.6* 26.6*  MCV 88.5 89.2 89.5 89.0  PLT 106* 96* 121* 093*   Basic Metabolic Panel: Recent Labs  Lab 07/31/18 0537 08/01/18 0552 08/02/18 0340 08/03/18 0442 08/04/18 0305  NA 139 138 138 138 137  K 3.9 3.8 4.2 4.5 4.9  CL 102 102  103 104 104  CO2 _0 21* 20*  GLUCOSE 138* 179* 171* 128* 142*  BUN 83* 104* 113* 118* 121*  CREATININE 5.35* 6.78* 6.91* 6.65* 6.87*  CALCIUM 8.8* 8.7* 9.0 9.2 8.9  PHOS 5.7* 5.8* 6.5* 7.6* 7.4*   Liver Function Tests: Recent Labs  Lab 07/31/18 0537 08/01/18 0552 08/02/18 0340 08/03/18 0442 08/04/18 0305  ALBUMIN 2.7* 2.9* 2.9* 2.9* 3.0*   Urinalysis    Component Value Date/Time   COLORURINE YELLOW 07/16/2018 2254   APPEARANCEUR HAZY (A) 07/16/2018 2254   LABSPEC 1.009 07/16/2018 2254   PHURINE 5.0 07/16/2018 2254   GLUCOSEU NEGATIVE 07/16/2018 2254   HGBUR LARGE (A) 07/16/2018 2254   BILIRUBINUR NEGATIVE 07/16/2018 2254   KETONESUR NEGATIVE 07/16/2018 2254   PROTEINUR 30 (A) 07/16/2018 2254   NITRITE NEGATIVE 07/16/2018 2254   LEUKOCYTESUR NEGATIVE 07/16/2018 2254      Time coordinating discharge: 40 minutes  SIGNED:  Vernell Leep, MD, FACP, [facility]. [facility]  To contact the attending provider between 7A-7P or the covering  provider during after hours 7P-7A, please log into the web site [email or web address] and access using universal [place] password for that web site. If you do not have the password, please call the hospital operator.

## 2018-08-04 NOTE — Progress Notes (Signed)
Jeremy Grant to be D/C'd Home per MD order.  Discussed prescriptions and follow up appointments with the patient. Prescriptions given to patient, medication list explained in detail. Pt verbalized understanding.  Allergies as of 08/04/2018      Reactions   Hydralazine Hcl       Medication List    STOP taking these medications   carvedilol 25 MG tablet Commonly known as:  COREG   furosemide 20 MG tablet Commonly known as:  LASIX   hydrALAZINE 100 MG tablet Commonly known as:  APRESOLINE   isosorbide mononitrate 30 MG 24 hr tablet Commonly known as:  IMDUR     TAKE these medications   amLODipine 10 MG tablet Commonly known as:  NORVASC Take 1 tablet (10 mg total) by mouth daily.   cyclophosphamide 50 MG capsule Commonly known as:  CYTOXAN Take 2 capsules (100 mg total) by mouth daily. Give on an empty stomach 1 hour before or 2 hours after meals. Start taking on:  August 05, 2018   doxazosin 4 MG tablet Commonly known as:  CARDURA Take 1 tablet (4 mg total) by mouth 2 (two) times daily.   famotidine 20 MG tablet Commonly known as:  PEPCID Take 1 tablet (20 mg total) by mouth daily. Start taking on:  August 05, 1222   folic acid 1 MG tablet Commonly known as:  FOLVITE Take 1 tablet (1 mg total) by mouth daily.   multivitamin Tabs tablet Take 1 tablet by mouth at bedtime.   predniSONE 20 MG tablet Commonly known as:  DELTASONE Take 3 tablets (60 mg total) by mouth daily with breakfast. Start taking on:  August 05, 2018   sodium bicarbonate 650 MG tablet Take 1 tablet (650 mg total) by mouth 2 (two) times daily.   spironolactone 25 MG tablet Commonly known as:  ALDACTONE Take 1 tablet (25 mg total) by mouth 2 (two) times daily.   thiamine 100 MG tablet Take 1 tablet (100 mg total) by mouth daily.       Vitals:   08/03/18 2100 08/04/18 0900  BP: (!) 172/84 (!) 167/80  Pulse: 68 64  Resp: 20 18  Temp: 98.4 F (36.9 C) 98 F (36.7 C)  SpO2: 98% 99%     Skin clean, dry and intact without evidence of skin break down, no evidence of skin tears noted. IV catheter discontinued intact. Site without signs and symptoms of complications. Dressing and pressure applied. Pt denies pain at this time. No complaints noted.  An After Visit Summary was printed and given to the patient. Patient escorted via [place], and D/C home via private auto.

## 2018-08-04 NOTE — Discharge Instructions (Signed)

## 2018-08-04 NOTE — Progress Notes (Signed)
Pt seen in room alert/oriented in no acute distress. HD cath site accessed, flushed and locked with heparin as ordered by nephrologist. Report given to primary nurse.

## 2018-08-05 NOTE — Progress Notes (Signed)
Received call from wife that [facility] could not [facility] cyclophosphamide (Cytoxan) 100 mg daily today and that it would take 2 days to order. [facility]vised [facility]. Checked with [facility] who stated they did not have medication. Checked with [facility]OC pharmacy - but no response d/t after hours. Checked with floor pharmacy. Spoke with Dr. Moshe Cipro to inform that patient was without medication for up to 3 doses.   Bartholomew Crews, RN MSN CCM Transitions of Care [phone]

## 2018-08-05 NOTE — Progress Notes (Signed)
Contacted by charge nurse today that patient who transitioned home yesterday evening had called about his prescription for cytoxan 100 mg not going through at the pharmacy. Telephone to pharmacy who stated that they never received the script for that medication, however, they also do not have that medication in stock and recommended using a large chain. Looked up medication on [facility]. Spoke with wife about medication and she asked about getting medication from [facility]. Telephone call to [facility] to leave message for Dr. Moshe Cipro. Received call back - Dr. Moshe Cipro to call in prescription to [facility]. Telephone call to wife to advise. Wife asked what to do if something goes wrong - advised to call [facility] since they are managing this medication. Wife expressed appreciation for assistance.   Bartholomew Crews, RN MSN CCM Transitions of Care [phone]

## 2018-08-07 ENCOUNTER — Encounter (HOSPITAL_COMMUNITY): Payer: Self-pay

## 2018-09-04 NOTE — ED Provider Notes (Signed)
Addendum to note 07/15/2018 CRITICAL CARE Performed by: Pattricia Boss Total critical care time: 45 minutes Critical care time was exclusive of separately billable procedures and treating other patients. Critical care was necessary to treat or prevent imminent or life-threatening deterioration. Critical care was time spent personally by me on the following activities: development of treatment plan with patient and/or surrogate as well as nursing, discussions with consultants, evaluation of patient's response to treatment, examination of patient, obtaining history from patient or surrogate, ordering and performing treatments and interventions, ordering and review of laboratory studies, ordering and review of radiographic studies, pulse oximetry and re-evaluation of patient's condition.    Pattricia Boss, MD 09/04/18 [phone]

## 2018-09-08 ENCOUNTER — Telehealth: Payer: Self-pay | Admitting: Medical Oncology

## 2018-09-08 NOTE — Telephone Encounter (Signed)
Cancelled- Thursday appt. Wife does not want him to come in Thursday and risk  COVID -19 exposue . Dialysis 3 x week. Full labs every Thursday . His labs can be retrieved from [facility]. I called for lab results.

## 2018-09-08 NOTE — Telephone Encounter (Signed)
He has nothing to do with hematology at this point.  His bone marrow biopsy was unremarkable for multiple myeloma.  He can continue his routine follow-up visit and evaluation by nephrology.

## 2018-09-11 ENCOUNTER — Telehealth: Payer: Self-pay | Admitting: Medical Oncology

## 2018-09-11 ENCOUNTER — Ambulatory Visit: Payer: BLUE CROSS/BLUE SHIELD | Admitting: Internal Medicine

## 2018-09-11 ENCOUNTER — Other Ambulatory Visit: Payer: BLUE CROSS/BLUE SHIELD

## 2018-09-11 NOTE — Telephone Encounter (Signed)
Per Dr Mohamed regarding f/u appts. "He has nothing to do with hematology at this point.  His bone marrow biopsy was unremarkable for multiple myeloma.  He can continue his routine follow-up visit and evaluation by nephrology". LVM on pts phone with Mohamed's note above. 

## 2018-10-14 ENCOUNTER — Other Ambulatory Visit: Payer: Self-pay

## 2018-10-14 ENCOUNTER — Ambulatory Visit (HOSPITAL_COMMUNITY)
Admission: RE | Admit: 2018-10-14 | Discharge: 2018-10-14 | Disposition: A | Discharge status: Home / Self Care | Payer: BC Managed Care – PPO | Source: Ambulatory Visit | Attending: Nephrology | Admitting: Nephrology

## 2018-10-14 DIAGNOSIS — D649 Anemia, unspecified: Secondary | ICD-10-CM | POA: Diagnosis not present

## 2018-10-14 LAB — ABO/RH: ABO/RH(D): O NEG

## 2018-10-14 LAB — PREPARE RBC (CROSSMATCH)

## 2018-10-14 MED ORDER — SODIUM CHLORIDE 0.9% IV SOLUTION
Freq: Once | INTRAVENOUS | Status: AC
  Administered 2018-10-14: 11:00:00 via INTRAVENOUS

## 2018-10-14 NOTE — Discharge Instructions (Signed)
Blood Transfusion, Adult, Care After This sheet gives you information about how to care for yourself after your procedure. Your doctor may also give you more specific instructions. If you have problems or questions, contact your doctor. Follow these instructions at home:   Take over-the-counter and prescription medicines only as told by your doctor.  Go back to your normal activities as told by your doctor.  Follow instructions from your doctor about how to take care of the area where an IV tube was put into your vein (insertion site). Make sure you: ? Wash your hands with soap and water before you change your bandage (dressing). If there is no soap and water, use hand sanitizer. ? Change your bandage as told by your doctor.  Check your IV insertion site every day for signs of infection. Check for: ? More redness, swelling, or pain. ? More fluid or blood. ? Warmth. ? Pus or a bad smell. Contact a doctor if:  You have more redness, swelling, or pain around the IV insertion site.  You have more fluid or blood coming from the IV insertion site.  Your IV insertion site feels warm to the touch.  You have pus or a bad smell coming from the IV insertion site.  Your pee (urine) turns pink, red, or brown.  You feel weak after doing your normal activities. Get help right away if:  You have signs of a serious allergic or body defense (immune) system reaction, including: ? Itchiness. ? Hives. ? Trouble breathing. ? Anxiety. ? Pain in your chest or lower back. ? Fever, flushing, and chills. ? Fast pulse. ? Rash. ? Watery poop (diarrhea). ? Throwing up (vomiting). ? Dark pee. ? Serious headache. ? Dizziness. ? Stiff neck. ? Yellow color in your face or the white parts of your eyes (jaundice). Summary  After a blood transfusion, return to your normal activities as told by your doctor.  Every day, check for signs of infection where the IV tube was put into your vein.  Some  signs of infection are warm skin, more redness and pain, more fluid or blood, and pus or a bad smell where the needle went in.  Contact your doctor if you feel weak or have any unusual symptoms. This information is not intended to replace advice given to you by your health care provider. Make sure you discuss any questions you have with your health care provider. Document Released: 05/21/2014 Document Revised: 12/23/2015 Document Reviewed: 12/23/2015 Elsevier Interactive Patient Education  2019 Elsevier Inc.  

## 2018-10-14 NOTE — Progress Notes (Signed)
PATIENT CARE CENTER NOTE  Diagnosis: Anemia    Provider: Donato Heinz, MD   Procedure: 1 unit PRBC's    Note: Patient received 1 unit of blood via PIV. Patient tolerated transfusion well with no adverse reaction. Discharge instructions given. Patient alert, oriented and ambulatory at discharge.

## 2018-10-15 LAB — TYPE AND SCREEN
ABO/RH(D): O NEG
Antibody Screen: NEGATIVE
Unit division: 0

## 2018-10-15 LAB — BPAM RBC
Blood Product Expiration Date: 202006062359
ISSUE DATE / TIME: 202006021022
Unit Type and Rh: 9500

## 2018-12-10 ENCOUNTER — Encounter: Payer: Self-pay | Admitting: Nephrology

## 2018-12-12 ENCOUNTER — Encounter (HOSPITAL_COMMUNITY): Payer: BC Managed Care – PPO

## 2018-12-12 NOTE — Discharge Instructions (Signed)

## 2018-12-15 ENCOUNTER — Encounter (HOSPITAL_COMMUNITY)
Admission: RE | Admit: 2018-12-15 | Discharge: 2018-12-15 | Disposition: A | Discharge status: Home / Self Care | Payer: BC Managed Care – PPO | Source: Ambulatory Visit | Attending: Nephrology | Admitting: Nephrology

## 2018-12-15 ENCOUNTER — Other Ambulatory Visit: Payer: Self-pay

## 2018-12-15 VITALS — BP 178/104 | HR 80 | Temp 97.3°F | Resp 20

## 2018-12-15 DIAGNOSIS — N179 Acute kidney failure, unspecified: Secondary | ICD-10-CM | POA: Diagnosis not present

## 2018-12-15 LAB — POCT HEMOGLOBIN-HEMACUE: Hemoglobin: 8.5 g/dL — ABNORMAL LOW (ref 13.0–17.0)

## 2018-12-15 MED ORDER — EPOETIN ALFA-EPBX 10000 UNIT/ML IJ SOLN
20000.0000 [IU] | INTRAMUSCULAR | Status: DC
  Administered 2018-12-15: 20000 [IU] via SUBCUTANEOUS
  Filled 2018-12-15: qty 2

## 2018-12-29 ENCOUNTER — Other Ambulatory Visit: Payer: Self-pay

## 2018-12-29 ENCOUNTER — Ambulatory Visit (HOSPITAL_COMMUNITY)
Admission: RE | Admit: 2018-12-29 | Discharge: 2018-12-29 | Disposition: A | Discharge status: Home / Self Care | Payer: BC Managed Care – PPO | Source: Ambulatory Visit | Attending: Nephrology | Admitting: Nephrology

## 2018-12-29 VITALS — BP 171/85 | HR 77 | Temp 97.8°F | Resp 18

## 2018-12-29 DIAGNOSIS — N179 Acute kidney failure, unspecified: Secondary | ICD-10-CM | POA: Diagnosis not present

## 2018-12-29 LAB — POCT HEMOGLOBIN-HEMACUE: Hemoglobin: 8.1 g/dL — ABNORMAL LOW (ref 13.0–17.0)

## 2018-12-29 MED ORDER — EPOETIN ALFA-EPBX 10000 UNIT/ML IJ SOLN
20000.0000 [IU] | INTRAMUSCULAR | Status: DC
  Administered 2018-12-29: 08:00:00 20000 [IU] via SUBCUTANEOUS
  Filled 2018-12-29: qty 2

## 2019-01-12 ENCOUNTER — Other Ambulatory Visit: Payer: Self-pay

## 2019-01-12 ENCOUNTER — Encounter (HOSPITAL_COMMUNITY): Payer: BC Managed Care – PPO

## 2019-01-12 ENCOUNTER — Inpatient Hospital Stay (HOSPITAL_COMMUNITY): Payer: BC Managed Care – PPO

## 2019-01-12 ENCOUNTER — Inpatient Hospital Stay (HOSPITAL_COMMUNITY)
Admission: EM | Admit: 2019-01-12 | Discharge: 2019-01-15 | DRG: 065 | Disposition: A | Discharge status: Rehabilitation Facility | Payer: BC Managed Care – PPO | Attending: Internal Medicine | Admitting: Internal Medicine

## 2019-01-12 ENCOUNTER — Emergency Department (HOSPITAL_COMMUNITY): Payer: BC Managed Care – PPO

## 2019-01-12 ENCOUNTER — Encounter (HOSPITAL_COMMUNITY): Payer: Self-pay | Admitting: Emergency Medicine

## 2019-01-12 DIAGNOSIS — Z20828 Contact with and (suspected) exposure to other viral communicable diseases: Secondary | ICD-10-CM | POA: Diagnosis present

## 2019-01-12 DIAGNOSIS — R29810 Facial weakness: Secondary | ICD-10-CM | POA: Diagnosis present

## 2019-01-12 DIAGNOSIS — I63421 Cerebral infarction due to embolism of right anterior cerebral artery: Principal | ICD-10-CM | POA: Diagnosis present

## 2019-01-12 DIAGNOSIS — I639 Cerebral infarction, unspecified: Secondary | ICD-10-CM | POA: Diagnosis not present

## 2019-01-12 DIAGNOSIS — R011 Cardiac murmur, unspecified: Secondary | ICD-10-CM | POA: Diagnosis not present

## 2019-01-12 DIAGNOSIS — Z7902 Long term (current) use of antithrombotics/antiplatelets: Secondary | ICD-10-CM | POA: Diagnosis not present

## 2019-01-12 DIAGNOSIS — D696 Thrombocytopenia, unspecified: Secondary | ICD-10-CM | POA: Diagnosis not present

## 2019-01-12 DIAGNOSIS — R531 Weakness: Secondary | ICD-10-CM | POA: Diagnosis not present

## 2019-01-12 DIAGNOSIS — Z79899 Other long term (current) drug therapy: Secondary | ICD-10-CM | POA: Diagnosis not present

## 2019-01-12 DIAGNOSIS — I776 Arteritis, unspecified: Secondary | ICD-10-CM | POA: Diagnosis present

## 2019-01-12 DIAGNOSIS — R29702 NIHSS score 2: Secondary | ICD-10-CM | POA: Diagnosis present

## 2019-01-12 DIAGNOSIS — I428 Other cardiomyopathies: Secondary | ICD-10-CM | POA: Diagnosis present

## 2019-01-12 DIAGNOSIS — Z888 Allergy status to other drugs, medicaments and biological substances status: Secondary | ICD-10-CM

## 2019-01-12 DIAGNOSIS — I491 Atrial premature depolarization: Secondary | ICD-10-CM | POA: Diagnosis not present

## 2019-01-12 DIAGNOSIS — N184 Chronic kidney disease, stage 4 (severe): Secondary | ICD-10-CM | POA: Diagnosis present

## 2019-01-12 DIAGNOSIS — N189 Chronic kidney disease, unspecified: Secondary | ICD-10-CM | POA: Diagnosis not present

## 2019-01-12 DIAGNOSIS — M4316 Spondylolisthesis, lumbar region: Secondary | ICD-10-CM | POA: Diagnosis present

## 2019-01-12 DIAGNOSIS — Z7982 Long term (current) use of aspirin: Secondary | ICD-10-CM

## 2019-01-12 DIAGNOSIS — K5900 Constipation, unspecified: Secondary | ICD-10-CM | POA: Diagnosis not present

## 2019-01-12 DIAGNOSIS — Z9889 Other specified postprocedural states: Secondary | ICD-10-CM | POA: Diagnosis not present

## 2019-01-12 DIAGNOSIS — I10 Essential (primary) hypertension: Secondary | ICD-10-CM | POA: Diagnosis not present

## 2019-01-12 DIAGNOSIS — Z8619 Personal history of other infectious and parasitic diseases: Secondary | ICD-10-CM | POA: Diagnosis not present

## 2019-01-12 DIAGNOSIS — I130 Hypertensive heart and chronic kidney disease with heart failure and stage 1 through stage 4 chronic kidney disease, or unspecified chronic kidney disease: Secondary | ICD-10-CM | POA: Diagnosis present

## 2019-01-12 DIAGNOSIS — I5042 Chronic combined systolic (congestive) and diastolic (congestive) heart failure: Secondary | ICD-10-CM | POA: Diagnosis present

## 2019-01-12 DIAGNOSIS — D61818 Other pancytopenia: Secondary | ICD-10-CM | POA: Diagnosis present

## 2019-01-12 DIAGNOSIS — G8110 Spastic hemiplegia affecting unspecified side: Secondary | ICD-10-CM | POA: Diagnosis not present

## 2019-01-12 DIAGNOSIS — I6300 Cerebral infarction due to thrombosis of unspecified precerebral artery: Secondary | ICD-10-CM | POA: Diagnosis not present

## 2019-01-12 DIAGNOSIS — D62 Acute posthemorrhagic anemia: Secondary | ICD-10-CM | POA: Diagnosis not present

## 2019-01-12 DIAGNOSIS — I499 Cardiac arrhythmia, unspecified: Secondary | ICD-10-CM

## 2019-01-12 DIAGNOSIS — I509 Heart failure, unspecified: Secondary | ICD-10-CM

## 2019-01-12 DIAGNOSIS — G8314 Monoplegia of lower limb affecting left nondominant side: Secondary | ICD-10-CM | POA: Diagnosis not present

## 2019-01-12 DIAGNOSIS — K2210 Ulcer of esophagus without bleeding: Secondary | ICD-10-CM | POA: Diagnosis not present

## 2019-01-12 DIAGNOSIS — M4800 Spinal stenosis, site unspecified: Secondary | ICD-10-CM | POA: Diagnosis present

## 2019-01-12 DIAGNOSIS — N059 Unspecified nephritic syndrome with unspecified morphologic changes: Secondary | ICD-10-CM | POA: Diagnosis present

## 2019-01-12 DIAGNOSIS — I6389 Other cerebral infarction: Secondary | ICD-10-CM | POA: Diagnosis not present

## 2019-01-12 DIAGNOSIS — Z7952 Long term (current) use of systemic steroids: Secondary | ICD-10-CM | POA: Diagnosis not present

## 2019-01-12 DIAGNOSIS — Z8249 Family history of ischemic heart disease and other diseases of the circulatory system: Secondary | ICD-10-CM

## 2019-01-12 DIAGNOSIS — T380X5A Adverse effect of glucocorticoids and synthetic analogues, initial encounter: Secondary | ICD-10-CM | POA: Diagnosis not present

## 2019-01-12 DIAGNOSIS — E785 Hyperlipidemia, unspecified: Secondary | ICD-10-CM | POA: Diagnosis present

## 2019-01-12 DIAGNOSIS — R29703 NIHSS score 3: Secondary | ICD-10-CM | POA: Diagnosis present

## 2019-01-12 DIAGNOSIS — R739 Hyperglycemia, unspecified: Secondary | ICD-10-CM | POA: Diagnosis not present

## 2019-01-12 DIAGNOSIS — I63412 Cerebral infarction due to embolism of left middle cerebral artery: Secondary | ICD-10-CM | POA: Diagnosis present

## 2019-01-12 DIAGNOSIS — M47816 Spondylosis without myelopathy or radiculopathy, lumbar region: Secondary | ICD-10-CM | POA: Diagnosis present

## 2019-01-12 DIAGNOSIS — D649 Anemia, unspecified: Secondary | ICD-10-CM | POA: Diagnosis not present

## 2019-01-12 DIAGNOSIS — I150 Renovascular hypertension: Secondary | ICD-10-CM | POA: Diagnosis not present

## 2019-01-12 DIAGNOSIS — I69354 Hemiplegia and hemiparesis following cerebral infarction affecting left non-dominant side: Secondary | ICD-10-CM | POA: Diagnosis not present

## 2019-01-12 DIAGNOSIS — I63521 Cerebral infarction due to unspecified occlusion or stenosis of right anterior cerebral artery: Secondary | ICD-10-CM | POA: Diagnosis not present

## 2019-01-12 DIAGNOSIS — R569 Unspecified convulsions: Secondary | ICD-10-CM | POA: Diagnosis not present

## 2019-01-12 DIAGNOSIS — M5126 Other intervertebral disc displacement, lumbar region: Secondary | ICD-10-CM | POA: Diagnosis present

## 2019-01-12 DIAGNOSIS — M5127 Other intervertebral disc displacement, lumbosacral region: Secondary | ICD-10-CM | POA: Diagnosis present

## 2019-01-12 DIAGNOSIS — R262 Difficulty in walking, not elsewhere classified: Secondary | ICD-10-CM | POA: Diagnosis present

## 2019-01-12 DIAGNOSIS — Z8673 Personal history of transient ischemic attack (TIA), and cerebral infarction without residual deficits: Secondary | ICD-10-CM

## 2019-01-12 DIAGNOSIS — I7789 Other specified disorders of arteries and arterioles: Secondary | ICD-10-CM | POA: Diagnosis not present

## 2019-01-12 LAB — URINALYSIS, ROUTINE W REFLEX MICROSCOPIC
Bilirubin Urine: NEGATIVE
Glucose, UA: NEGATIVE mg/dL
Ketones, ur: NEGATIVE mg/dL
Leukocytes,Ua: NEGATIVE
Nitrite: NEGATIVE
Protein, ur: 100 mg/dL — AB
Specific Gravity, Urine: 1.011 (ref 1.005–1.030)
pH: 5 (ref 5.0–8.0)

## 2019-01-12 LAB — COMPREHENSIVE METABOLIC PANEL
ALT: 19 U/L (ref 0–44)
AST: 15 U/L (ref 15–41)
Albumin: 4.1 g/dL (ref 3.5–5.0)
Alkaline Phosphatase: 46 U/L (ref 38–126)
Anion gap: 10 (ref 5–15)
BUN: 74 mg/dL — ABNORMAL HIGH (ref 6–20)
CO2: 22 mmol/L (ref 22–32)
Calcium: 9.1 mg/dL (ref 8.9–10.3)
Chloride: 107 mmol/L (ref 98–111)
Creatinine, Ser: 2.95 mg/dL — ABNORMAL HIGH (ref 0.61–1.24)
GFR calc Af Amer: 26 mL/min — ABNORMAL LOW (ref >60)
GFR calc non Af Amer: 22 mL/min — ABNORMAL LOW (ref >60)
Glucose, Bld: 119 mg/dL — ABNORMAL HIGH (ref 70–99)
Potassium: 3.6 mmol/L (ref 3.5–5.1)
Sodium: 139 mmol/L (ref 135–145)
Total Bilirubin: 0.9 mg/dL (ref 0.3–1.2)
Total Protein: 7.1 g/dL (ref 6.5–8.1)

## 2019-01-12 LAB — CBC WITH DIFFERENTIAL/PLATELET
Abs Immature Granulocytes: 0.03 10*3/uL (ref 0.00–0.07)
Basophils Absolute: 0 10*3/uL (ref 0.0–0.1)
Basophils Relative: 1 %
Eosinophils Absolute: 0 10*3/uL (ref 0.0–0.5)
Eosinophils Relative: 1 %
HCT: 24.3 % — ABNORMAL LOW (ref 39.0–52.0)
Hemoglobin: 8.8 g/dL — ABNORMAL LOW (ref 13.0–17.0)
Immature Granulocytes: 1 %
Lymphocytes Relative: 9 %
Lymphs Abs: 0.3 10*3/uL — ABNORMAL LOW (ref 0.7–4.0)
MCH: 37.1 pg — ABNORMAL HIGH (ref 26.0–34.0)
MCHC: 36.2 g/dL — ABNORMAL HIGH (ref 30.0–36.0)
MCV: 102.5 fL — ABNORMAL HIGH (ref 80.0–100.0)
Monocytes Absolute: 0.3 10*3/uL (ref 0.1–1.0)
Monocytes Relative: 10 %
Neutro Abs: 2.8 10*3/uL (ref 1.7–7.7)
Neutrophils Relative %: 78 %
Platelets: 115 10*3/uL — ABNORMAL LOW (ref 150–400)
RBC: 2.37 MIL/uL — ABNORMAL LOW (ref 4.22–5.81)
RDW: 13.8 % (ref 11.5–15.5)
WBC: 3.5 10*3/uL — ABNORMAL LOW (ref 4.0–10.5)
nRBC: 0 % (ref 0.0–0.2)

## 2019-01-12 LAB — CBC
HCT: 22.6 % — ABNORMAL LOW (ref 39.0–52.0)
Hemoglobin: 8.1 g/dL — ABNORMAL LOW (ref 13.0–17.0)
MCH: 36.5 pg — ABNORMAL HIGH (ref 26.0–34.0)
MCHC: 35.8 g/dL (ref 30.0–36.0)
MCV: 101.8 fL — ABNORMAL HIGH (ref 80.0–100.0)
Platelets: 114 10*3/uL — ABNORMAL LOW (ref 150–400)
RBC: 2.22 MIL/uL — ABNORMAL LOW (ref 4.22–5.81)
RDW: 13.7 % (ref 11.5–15.5)
WBC: 3.6 10*3/uL — ABNORMAL LOW (ref 4.0–10.5)
nRBC: 0 % (ref 0.0–0.2)

## 2019-01-12 LAB — CREATININE, SERUM
Creatinine, Ser: 2.93 mg/dL — ABNORMAL HIGH (ref 0.61–1.24)
GFR calc Af Amer: 26 mL/min — ABNORMAL LOW (ref >60)
GFR calc non Af Amer: 22 mL/min — ABNORMAL LOW (ref >60)

## 2019-01-12 LAB — PROTIME-INR
INR: 1 (ref 0.8–1.2)
Prothrombin Time: 13 seconds (ref 11.4–15.2)

## 2019-01-12 LAB — HEMOGLOBIN A1C
Hgb A1c MFr Bld: 5.4 % (ref 4.8–5.6)
Mean Plasma Glucose: 108.28 mg/dL

## 2019-01-12 LAB — SARS CORONAVIRUS 2 (TAT 6-24 HRS): SARS Coronavirus 2: NEGATIVE

## 2019-01-12 MED ORDER — ATORVASTATIN CALCIUM 80 MG PO TABS
80.0000 mg | ORAL_TABLET | Freq: Every day | ORAL | Status: DC
  Administered 2019-01-12 – 2019-01-15 (×4): 80 mg via ORAL
  Filled 2019-01-12 (×4): qty 1

## 2019-01-12 MED ORDER — ENOXAPARIN SODIUM 30 MG/0.3ML ~~LOC~~ SOLN
30.0000 mg | SUBCUTANEOUS | Status: DC
  Administered 2019-01-12 – 2019-01-15 (×4): 30 mg via SUBCUTANEOUS
  Filled 2019-01-12 (×4): qty 0.3

## 2019-01-12 MED ORDER — ASPIRIN 325 MG PO TABS
325.0000 mg | ORAL_TABLET | Freq: Every day | ORAL | Status: DC
  Administered 2019-01-12 – 2019-01-15 (×4): 325 mg via ORAL
  Filled 2019-01-12 (×4): qty 1

## 2019-01-12 MED ORDER — PREDNISONE 5 MG PO TABS
5.0000 mg | ORAL_TABLET | Freq: Every day | ORAL | Status: DC
  Administered 2019-01-13 – 2019-01-15 (×3): 5 mg via ORAL
  Filled 2019-01-12 (×3): qty 1

## 2019-01-12 MED ORDER — CLOPIDOGREL BISULFATE 75 MG PO TABS
75.0000 mg | ORAL_TABLET | Freq: Every day | ORAL | Status: DC
  Administered 2019-01-13 – 2019-01-15 (×3): 75 mg via ORAL
  Filled 2019-01-12 (×3): qty 1

## 2019-01-12 MED ORDER — SODIUM CHLORIDE 0.9% FLUSH
3.0000 mL | Freq: Two times a day (BID) | INTRAVENOUS | Status: DC
  Administered 2019-01-12 – 2019-01-15 (×7): 3 mL via INTRAVENOUS

## 2019-01-12 MED ORDER — CYCLOPHOSPHAMIDE 25 MG PO CAPS
100.0000 mg | ORAL_CAPSULE | Freq: Every day | ORAL | Status: DC
  Administered 2019-01-13: 100 mg via ORAL
  Filled 2019-01-12 (×3): qty 2
  Filled 2019-01-12: qty 4
  Filled 2019-01-12: qty 2
  Filled 2019-01-12: qty 4

## 2019-01-12 MED ORDER — CLOPIDOGREL BISULFATE 75 MG PO TABS
300.0000 mg | ORAL_TABLET | Freq: Once | ORAL | Status: AC
  Administered 2019-01-12: 300 mg via ORAL
  Filled 2019-01-12: qty 4

## 2019-01-12 NOTE — ED Notes (Signed)
ED TO INPATIENT HANDOFF REPORT  ED Nurse Name and Phone #:   S Name/Age/Gender Jeremy Grant 58 y.o. male Room/Bed: 012C/012C  Code Status   Code Status: Full Code  Home/SNF/Other Home Patient oriented to: self, place, time and situation Is this baseline? Yes   Triage Complete: Triage complete  Chief Complaint weakness  Triage Note Pt arrives [facility] with co left leg weakness x3days. From home. Pt hx of shingles in left leg this past may. Unable to walk on leg. Pt has fell a few times last few days due to weakness in that leg.   [place] at Bed side for test.   Pt is currently  In MRI Dept.   Allergies Allergies  Allergen Reactions  . Hydralazine Hcl     Level of Care/Admitting Diagnosis ED Disposition    ED Disposition Condition [facility] Area: [place] [[id number]]  Level of Care: Telemetry Medical [104]  Covid Evaluation: Asymptomatic Screening Protocol (No Symptoms)  Diagnosis: CVA (cerebral vascular accident) [facility]) [[id number]]  Admitting Physician: Oda Kilts [[id number]]  Attending Physician: Oda Kilts [[id number]]  Estimated length of stay: past midnight tomorrow  Certification:: I certify this patient will need inpatient services for at least 2 midnights  PT Class (Do Not Modify): Inpatient [101]  PT Acc Code (Do Not Modify): Private [1]       B Medical/Surgery History Past Medical History:  Diagnosis Date  . Arthritis   . CHF (congestive heart failure) ([place])   . Combined systolic and diastolic cardiac dysfunction    Echo 11/03/2013 EF 78%, grade 3 diastolic dysfunction  . Hypertension   . NICM (nonischemic cardiomyopathy) ([place])    a. L/RHC (11/05/13): RA: 3, RV 52/5, PA 49/19 (31), PCWP 10, AO 166/93, PA 67%, Fick CO/CI: 5.71/2.97, Lmain: normal, LAD: large, without signficant dz, first diagonal has 20% dz at ostium, LCx: normal, RCA: 30% stenosis at the bifurcation of PDA and PLOM   Past Surgical History:   Procedure Laterality Date  . IR FLUORO GUIDE CV LINE RIGHT  07/25/2018  . IR US GUIDE VASC ACCESS RIGHT  07/25/2018  . LEFT AND RIGHT HEART CATHETERIZATION WITH CORONARY ANGIOGRAM N/A 11/05/2013   Procedure: LEFT AND RIGHT HEART CATHETERIZATION WITH CORONARY ANGIOGRAM;  Surgeon: Peter M Martinique, MD;  Location: [facility] CATH LAB;  Service: Cardiovascular;  Laterality: N/A;  . RIGHT HEART CATHETERIZATION N/A 01/14/2014   Procedure: RIGHT HEART CATH;  Surgeon: Larey Dresser, MD;  Location: [facility] CATH LAB;  Service: Cardiovascular;  Laterality: N/A;     A IV Location/Drains/Wounds Patient Lines/Drains/Airways Status   Active Line/Drains/Airways    Name:   Placement date:   Placement time:   Site:   Days:   Peripheral IV 01/12/19 Right Antecubital   01/12/19    0941    Antecubital   less than 1   Hemodialysis Catheter Right Internal jugular Permanent;Double-lumen   07/25/18    1447    Internal jugular   171          Intake/Output Last 24 hours No intake or output data in the 24 hours ending 01/12/19 1517  Labs/Imaging Results for orders placed or performed during the hospital encounter of 01/12/19 (from the past 48 hour(s))  CBC with Differential     Status: Abnormal   Collection Time: 01/12/19  9:28 AM  Result Value Ref Range   WBC 3.5 (L) 4.0 - 10.5 K/uL   RBC 2.37 (L) 4.22 -  5.81 MIL/uL   Hemoglobin 8.8 (L) 13.0 - 17.0 g/dL   HCT 24.3 (L) 39.0 - 52.0 %   MCV 102.5 (H) 80.0 - 100.0 fL   MCH 37.1 (H) 26.0 - 34.0 pg   MCHC 36.2 (H) 30.0 - 36.0 g/dL   RDW 13.8 11.5 - 15.5 %   Platelets 115 (L) 150 - 400 K/uL    Comment: REPEATED TO VERIFY PLATELET COUNT CONFIRMED BY SMEAR Immature Platelet Fraction may be clinically indicated, consider ordering this additional test [id number]    nRBC 0.0 0.0 - 0.2 %   Neutrophils Relative % 78 %   Neutro Abs 2.8 1.7 - 7.7 K/uL   Lymphocytes Relative 9 %   Lymphs Abs 0.3 (L) 0.7 - 4.0 K/uL   Monocytes Relative 10 %   Monocytes Absolute 0.3 0.1 - 1.0  K/uL   Eosinophils Relative 1 %   Eosinophils Absolute 0.0 0.0 - 0.5 K/uL   Basophils Relative 1 %   Basophils Absolute 0.0 0.0 - 0.1 K/uL   Immature Granulocytes 1 %   Abs Immature Granulocytes 0.03 0.00 - 0.07 K/uL    Comment: Performed at [facility], [address], [place], [place] [address]  Comprehensive metabolic panel     Status: Abnormal   Collection Time: 01/12/19  9:28 AM  Result Value Ref Range   Sodium 139 135 - 145 mmol/L   Potassium 3.6 3.5 - 5.1 mmol/L   Chloride 107 98 - 111 mmol/L   CO2 22 22 - 32 mmol/L   Glucose, Bld 119 (H) 70 - 99 mg/dL   BUN 74 (H) 6 - 20 mg/dL   Creatinine, Ser 2.95 (H) 0.61 - 1.24 mg/dL   Calcium 9.1 8.9 - 10.3 mg/dL   Total Protein 7.1 6.5 - 8.1 g/dL   Albumin 4.1 3.5 - 5.0 g/dL   AST 15 15 - 41 U/L   ALT 19 0 - 44 U/L   Alkaline Phosphatase 46 38 - 126 U/L   Total Bilirubin 0.9 0.3 - 1.2 mg/dL   GFR calc non Af Amer 22 (L) >60 mL/min   GFR calc Af Amer 26 (L) >60 mL/min   Anion gap 10 5 - 15    Comment: Performed at [facility] [address], [place], [place] [address]  Urinalysis, Routine w reflex microscopic     Status: Abnormal   Collection Time: 01/12/19  9:41 AM  Result Value Ref Range   Color, Urine STRAW (A) YELLOW   APPearance CLEAR CLEAR   Specific Gravity, Urine 1.011 1.005 - 1.030   pH 5.0 5.0 - 8.0   Glucose, UA NEGATIVE NEGATIVE mg/dL   Hgb urine dipstick MODERATE (A) NEGATIVE   Bilirubin Urine NEGATIVE NEGATIVE   Ketones, ur NEGATIVE NEGATIVE mg/dL   Protein, ur 100 (A) NEGATIVE mg/dL   Nitrite NEGATIVE NEGATIVE   Leukocytes,Ua NEGATIVE NEGATIVE   RBC / HPF 0-5 0 - 5 RBC/hpf   WBC, UA 0-5 0 - 5 WBC/hpf   Bacteria, UA RARE (A) NONE SEEN   Mucus PRESENT     Comment: Performed at [facility] [address], [place], [place] [address]   Mr Angio Head Wo Contrast  Result Date: 01/12/2019 CLINICAL DATA:  Left leg weakness. Right hemisphere more than left hemisphere infarctions. EXAM: MRA  HEAD WITHOUT CONTRAST TECHNIQUE: Angiographic images of the Circle of Willis were obtained using MRA technique without intravenous contrast. COMPARISON:  MRI brain same day FINDINGS: Both  internal carotid arteries are widely patent through the skull base and siphon regions. The anterior and middle cerebral vessels are patent proximally. No MCA territory abnormality is seen. The left anterior cerebral artery appears normal. There are abnormalities in the right anterior cerebral artery that could be stenoses or emboli. These are seen in the Pericallosal region. Both vertebral arteries are patent to the basilar. No basilar stenosis. Posterior circulation branch vessels are normal. IMPRESSION: Abnormal appearance of the right anterior cerebral artery in the pericallosal region most likely representing stenoses secondary to emboli. Electronically Signed   By: Nelson Chimes M.D.   On: 01/12/2019 14:56   Mr Brain Wo Contrast  Result Date: 01/12/2019 CLINICAL DATA:  Left leg muscle weakness. EXAM: MRI HEAD WITHOUT CONTRAST TECHNIQUE: Multiplanar, multiecho pulse sequences of the brain and surrounding structures were obtained without intravenous contrast. COMPARISON:  None. FINDINGS: Brain: Acute infarct in the deep white matter on the right involving the corpus callosum and frontal parietal deep white matter. Numerous small patchy areas of acute infarct on the right Facilitated diffusion in the left frontal white matter may be due to subacute infarct. Chronic microvascular ischemic changes in the white matter. Small chronic infarct left frontal cortex. Negative for mass or hydrocephalus. Mild atrophy. Chronic microhemorrhage right lateral cerebellum. No midline shift. Vascular: Normal arterial flow voids Skull and upper cervical spine: Negative Sinuses/Orbits: Mild mucosal edema paranasal sinuses.  Normal orbit Other: None IMPRESSION: Patchy areas of acute infarct in the deep white matter on the right including the  corpus callosum. Possible watershed type infarct. Chronic microvascular ischemic changes in the white matter. Electronically Signed   By: Franchot Gallo M.D.   On: 01/12/2019 10:43   Mr Lumbar Spine Wo Contrast  Result Date: 01/12/2019 CLINICAL DATA:  Muscle weakness left leg pain EXAM: MRI LUMBAR SPINE WITHOUT CONTRAST TECHNIQUE: Multiplanar, multisequence MR imaging of the lumbar spine was performed. No intravenous contrast was administered. COMPARISON:  None. FINDINGS: Segmentation: There are 5 non-rib bearing lumbar type vertebral bodies with the last intervertebral disc space labeled as L5-S1. Alignment: There is a minimal anterolisthesis of L4 on L5 measuring 3 mm. A minimal retrolisthesis of L1 on L2 is seen. Vertebrae: The vertebral body heights are well maintained. No fracture, marrow edema,or pathologic marrow infiltration. Conus medullaris and cauda equina: Conus extends to the L1 level. Conus and cauda equina appear normal. Paraspinal and other soft tissues: The paraspinal soft tissues and visualized retroperitoneal structures are unremarkable. The sacroiliac joints are intact. Disc levels: T12-L1:  No significant canal or neural foraminal narrowing. L1-L2: There is a minimal broad-based disc bulge which causes mild left neural foraminal narrowing. L2-L3: There is a minimal broad-based disc bulge with ligamentum flavum hypertrophy which causes moderate bilateral neural foraminal narrowing. L3-L4: There is a broad-based disc bulge with ligamentum flavum hypertrophy which causes mild bilateral neural foraminal narrowing. L4-L5: There is a broad-based disc bulge with disc uncovering and a central disc protrusion. The disc protrusion contacts the bilateral descending L5 nerve roots. There is facet arthrosis and ligamentum flavum hypertrophy. There is large bulky right-sided osteophyte which causes effacement of the thecal sac and slight displacement to the left. Moderate bilateral neural foraminal  narrowing is seen. There is mild central canal narrowing. L5-S1: There is a broad-based disc bulge with a central disc protrusion and annular fissure. The disc protrusion contacts the right S1 nerve root. There is moderate bilateral neural foraminal narrowing, left greater than right. IMPRESSION: 1. Grade 1 anterolisthesis of L4  on L5. 2. Lumbar spine spondylosis most notable at L4-L5 with a central disc protrusion contacting the bilateral descending L5 nerve roots and large bulky right-sided osteophytes which causes effacement of the thecal sac. There is moderate bilateral neural foraminal narrowing and mild central canal stenosis at this level. 3. Also at L5-S1 there is a central disc protrusion with annular fissure and the disc protrusion contacts the right S1 nerve root. Electronically Signed   By: Prudencio Pair M.D.   On: 01/12/2019 10:51    Pending Labs Unresulted Labs (From admission, onward)    Start     Ordered   01/19/19 0500  Creatinine, serum  (enoxaparin (LOVENOX)    CrCl < 30 ml/min)  Weekly,   R    Comments: while on enoxaparin therapy.    01/12/19 1136   01/13/19 0500  Lipid panel  Tomorrow morning,   R     01/12/19 1136   01/13/19 8250  Basic metabolic panel  Tomorrow morning,   R     01/12/19 1136   01/13/19 0500  CBC  Tomorrow morning,   R     01/12/19 1136   01/12/19 1134  CBC  (enoxaparin (LOVENOX)    CrCl < 30 ml/min)  Once,   STAT    Comments: Baseline for enoxaparin therapy IF NOT ALREADY DRAWN.  Notify MD if PLT < 100 K.    01/12/19 1136   01/12/19 1134  Creatinine, serum  (enoxaparin (LOVENOX)    CrCl < 30 ml/min)  Once,   STAT    Comments: Baseline for enoxaparin therapy IF NOT ALREADY DRAWN.    01/12/19 1136   01/12/19 1134  Hemoglobin A1c  Once,   STAT     01/12/19 1136   01/12/19 1134  Protime-INR  Once,   STAT     01/12/19 1136   01/12/19 1044  SARS CORONAVIRUS 2 (TAT 6-24 HRS) Nasopharyngeal Nasopharyngeal Swab  (Asymptomatic/Tier 2)  ONCE - STAT,   STAT     Question Answer Comment  Is this test for diagnosis or screening Screening   Symptomatic for COVID-19 as defined by CDC No   Hospitalized for COVID-19 No   Admitted to ICU for COVID-19 No   Previously tested for COVID-19 No   Resident in a congregate (group) care setting No   Employed in healthcare setting No      01/12/19 1043          Vitals/Pain Today's Vitals   01/12/19 0857 01/12/19 0915 01/12/19 1044 01/12/19 1045  BP:  (!) 153/88 (!) 179/83 (!) 181/83  Pulse:  (!) 59 82 75  Resp:  15 16   Temp: 98.1 F (36.7 C)     TempSrc: Oral     SpO2:  100% 99% 99%  Weight:      Height:      PainSc:        Isolation Precautions No active isolations  Medications Medications  enoxaparin (LOVENOX) injection 30 mg (has no administration in time range)  sodium chloride flush (NS) 0.9 % injection 3 mL (has no administration in time range)  aspirin tablet 325 mg (has no administration in time range)  cyclophosphamide (CYTOXAN) capsule 100 mg (has no administration in time range)  predniSONE (DELTASONE) tablet 5 mg (has no administration in time range)  atorvastatin (LIPITOR) tablet 80 mg (has no administration in time range)    Mobility manual wheelchair High fall risk   Focused Assessments Neuro Assessment Handoff:  Swallow screen  pass? Yes    NIH Stroke Scale ( + Modified Stroke Scale Criteria)  Interval: Initial Level of Consciousness (1a.)   : Alert, keenly responsive LOC Questions (1b. )   +: (PT absent from room in MRI dept ) LOC Commands (1c. )   + : Performs both tasks correctly Best Gaze (2. )  +: Normal Visual (3. )  +: No visual loss Facial Palsy (4. )    : Normal symmetrical movements Motor Arm, Left (5a. )   +: No drift Motor Arm, Right (5b. )   +: No drift Motor Leg, Left (6a. )   +: No effort against gravity Motor Leg, Right (6b. )   +: No drift Limb Ataxia (7. ): Absent Sensory (8. )   +: Normal, no sensory loss Best Language (9. )   +: No  aphasia Dysarthria (10. ): Normal Extinction/Inattention (11.)   +: No Abnormality Modified SS Total  +: 3 Complete NIHSS TOTAL: 3     Neuro Assessment:   Neuro Checks:   Initial (01/12/19 0930)  Last Documented NIHSS Modified Score: 3 (01/12/19 1230) Has TPA been given? No If patient is a Neuro Trauma and patient is going to OR before floor call report to [place] nurse: [phone] or [phone]     R Recommendations: See Admitting Provider Note  Report given to:   Additional Notes:

## 2019-01-12 NOTE — ED Notes (Signed)
Patient transported to MRI 

## 2019-01-12 NOTE — ED Triage Notes (Signed)
[place] at Bed side for test.

## 2019-01-12 NOTE — Progress Notes (Signed)
Echocardiogram 2D Echocardiogram has been performed.  [place] Deputy Niall Illes 01/12/2019, 1:51 PM

## 2019-01-12 NOTE — ED Triage Notes (Signed)
Pt arrives [facility] with co left leg weakness x3days. From home. Pt hx of shingles in left leg this past may. Unable to walk on leg. Pt has fell a few times last few days due to weakness in that leg.

## 2019-01-12 NOTE — Consult Note (Addendum)
Neurology Consultation  Reason for Consult: Stroke on MRI in new onset increased left leg weakness Referring Physician: Tegeler  History is obtained from: Patient and patient's wife  HPI: Jeremy Grant is a 58 y.o. 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  Patient and wife state that he did have shingles that extended down his left leg back in May.  Post this he had significant post herpetic neuralgia.  This has resolved.  He does state that he has had some weakness in that left leg however he is able to get around by himself to the point that he was trimming his grass on Friday afternoon.  Wife states that approximately 7:00 while watching a movie on Friday afternoon he stood up and his left leg gave out underneath him and he fell.  Since then he has had significant weakness in that left leg.  They did not come to the hospital initially as they thought they could brace it.  Due to the fact he was going to see 1 of his practitioners on Monday they held off on going to the hospital.  However due to the fact that he cannot even get him to the car, she did call the ambulance and he was brought to the hospital.  Patient received an MRI of the brain and lumbar spine.  MRI brain revealed a most likely watershed type infarct on the right to which neurology was called to evaluate and consult on.   ED course: Labs/MRI of brain and lumbar spine   LKW: 01/09/2019 at approximately 1900 hrs. tpa given?: no, out of window Premorbid modified Rankin scale (mRS): 0 NIH stroke scale of 2   ROS: A 14 point ROS was performed and is negative except as noted in the HPI.   Past Medical History:  Diagnosis Date  . Arthritis   . CHF (congestive heart failure) ([employer])   . Combined systolic and diastolic cardiac dysfunction    Echo 11/03/2013 EF 89%, grade 3 diastolic dysfunction  . Hypertension    . NICM (nonischemic cardiomyopathy) ([place])    a. L/RHC (11/05/13): RA: 3, RV 52/5, PA 49/19 (31), PCWP 10, AO 166/93, PA 67%, Fick CO/CI: 5.71/2.97, Lmain: normal, LAD: large, without signficant dz, first diagonal has 20% dz at ostium, LCx: normal, RCA: 30% stenosis at the bifurcation of PDA and PLOM    Family History  Problem Relation Age of Onset  . Heart attack Father   . Heart disease Father   . Arrhythmia Father   . Hypertension Brother   . Hypertension Brother     Social History:   reports that he has never smoked. He has never used smokeless tobacco. He reports current alcohol use of about 3.0 standard drinks of alcohol per week. He reports that he does not use drugs.  Medications No current facility-administered medications for this encounter.   Current Outpatient Medications:  .  amLODipine (NORVASC) 10 MG tablet, Take 1 tablet (10 mg total) by mouth daily., Disp: 30 tablet, Rfl: 0 .  carvedilol (COREG) 12.5 MG tablet, Take 12.5 mg by mouth 2 (two) times daily with a meal. , Disp: , Rfl:  .  cyclophosphamide (CYTOXAN) 50 MG capsule, Take 2 capsules (100 mg total) by mouth daily. Give on an empty stomach 1 hour before or 2 hours after meals., Disp: 60 capsule, Rfl: 0 .  doxazosin (CARDURA) 4 MG tablet, Take 1 tablet (4 mg total)  by mouth 2 (two) times daily., Disp: 60 tablet, Rfl: 0 .  famotidine (PEPCID) 20 MG tablet, Take 1 tablet (20 mg total) by mouth daily., Disp: 30 tablet, Rfl: 0 .  predniSONE (DELTASONE) 20 MG tablet, Take 3 tablets (60 mg total) by mouth daily with breakfast. (Patient taking differently: Take 5 mg by mouth daily with breakfast. ), Disp: 60 tablet, Rfl: 0 .  folic acid (FOLVITE) 1 MG tablet, Take 1 tablet (1 mg total) by mouth daily., Disp: 30 tablet, Rfl: 0 .  multivitamin (RENA-VIT) TABS tablet, Take 1 tablet by mouth at bedtime., Disp: 30 tablet, Rfl: 0 .  sodium bicarbonate 650 MG tablet, Take 1 tablet (650 mg total) by mouth 2 (two) times daily.,  Disp: 60 tablet, Rfl: 0 .  spironolactone (ALDACTONE) 25 MG tablet, Take 1 tablet (25 mg total) by mouth 2 (two) times daily., Disp: 60 tablet, Rfl: 0 .  thiamine 100 MG tablet, Take 1 tablet (100 mg total) by mouth daily., Disp: 30 tablet, Rfl: 0   Exam: Current vital signs: BP (!) 179/83 (BP Location: Right Arm)   Pulse 82   Temp 98.1 F (36.7 C) (Oral)   Resp 16   Ht 6\' 1"  (1.854 m)   Wt 64.4 kg   SpO2 99%   BMI 18.73 kg/m  Vital signs in last 24 hours: Temp:  [98.1 F (36.7 C)] 98.1 F (36.7 C) (08/31 0857) Pulse Rate:  [59-82] 82 (08/31 1044) Resp:  [13-17] 16 (08/31 1044) BP: (153-179)/(76-88) 179/83 (08/31 1044) SpO2:  [99 %-100 %] 99 % (08/31 1044) Weight:  [64.4 kg] 64.4 kg (08/31 0822)  Physical Exam  Constitutional: Appears well-developed and well-nourished.  Psych: Affect appropriate to situation Eyes: No scleral injection HENT: No OP obstrucion Head: Normocephalic.  Cardiovascular: Normal rate and regular rhythm.  Respiratory: Effort normal, non-labored breathing GI: Soft.  No distension. There is no tenderness.  Skin: WDI  Neuro: Mental Status: Patient is awake, alert, oriented to person, place, month, year, and situation. Patient is able to give a clear and coherent history. No signs of aphasia or neglect Cranial Nerves: II: Visual Fields are full.  III,IV, VI: EOMI without ptosis or diploplia. Pupils equal, round and reactive to light V: Facial sensation is symmetric to temperature VII: Left facial droop VIII: hearing is intact to voice X: Palat elevates symmetrically XI: Shoulder shrug is symmetric. XII: tongue is midline without atrophy or fasciculations.  Motor: Tone is normal. Bulk is normal. 5/5 strength was present in the right arm, right leg, and left arm. Left leg showed 5/5 with hip flexion, 3/5 with knee flexion, 4/5 with knee extension, 1/5 with dorsiflexion of ankle and 4/5 with plantarflexion Sensory: Sensation is symmetric to light  touch and temperature in the arms and legs. Deep Tendon Reflexes: 2+ in the right upper and lower extremity.  3+ in the left upper extremity with 3+ knee jerk on the left and 2+ at the ankle Plantars: Downgoing on the right upgoing on the left Cerebellar: Finger-nose did not show any dysmetria bilaterally, heel-to-shin showed no dysmetria on the right leg over left shin however with his left leg over her right shin there was dysmetria but this likely secondary to strength    Labs I have reviewed labs in epic and the results pertinent to this consultation are:   CBC    Component Value Date/Time   WBC 3.5 (L) 01/12/2019 0928   RBC 2.37 (L) 01/12/2019 0928   HGB 8.8 (L)  01/12/2019 0928   HCT 24.3 (L) 01/12/2019 0928   PLT 115 (L) 01/12/2019 0928   MCV 102.5 (H) 01/12/2019 0928   MCH 37.1 (H) 01/12/2019 0928   MCHC 36.2 (H) 01/12/2019 0928   RDW 13.8 01/12/2019 0928   LYMPHSABS 0.3 (L) 01/12/2019 0928   MONOABS 0.3 01/12/2019 0928   EOSABS 0.0 01/12/2019 0928   BASOSABS 0.0 01/12/2019 0928    CMP     Component Value Date/Time   NA 139 01/12/2019 0928   K 3.6 01/12/2019 0928   CL 107 01/12/2019 0928   CO2 22 01/12/2019 0928   GLUCOSE 119 (H) 01/12/2019 0928   BUN 74 (H) 01/12/2019 0928   CREATININE 2.95 (H) 01/12/2019 0928   CALCIUM 9.1 01/12/2019 0928   PROT 7.1 01/12/2019 0928   ALBUMIN 4.1 01/12/2019 0928   AST 15 01/12/2019 0928   ALT 19 01/12/2019 0928   ALKPHOS 46 01/12/2019 0928   BILITOT 0.9 01/12/2019 0928   GFRNONAA 22 (L) 01/12/2019 0928   GFRAA 26 (L) 01/12/2019 0928    Lipid Panel     Component Value Date/Time   CHOL 175 04/03/2016 0945   TRIG 62 04/03/2016 0945   HDL 57 04/03/2016 0945   CHOLHDL 3.1 04/03/2016 0945   VLDL 12 04/03/2016 0945   LDLCALC 106 (H) 04/03/2016 0945     Imaging I have reviewed the images obtained:   MRI examination of the brain- patchy areas of acute infarct in the deep white matter on the right including the  corpus callosum.  Possible watershed type infarct.  Etta Quill PA-C [facility] [phone]  M-F  (9:00 am- 5:00 PM)  01/12/2019, 11:02 AM   I have seen the patient reviewed the above note.  MRA with abnormal ACA on the right consistent with partially occlusive embolus.  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  I suspect embolic etiology with a partially recanalized embolus in the right ACA territory as well as old/subacute infarcts on the left.  I would favor dual antiplatelet therapy for 3 weeks.  Recommend #Transthoracic Echo,  #Aspirin 81 mg daily with Plavix 75 mg daily following 300 mg load x3 weeks #Start or continue Atorvastatin 80 mg/other high intensity statin # BP goal: permissive HTN upto 220/120 mmHg # HBAIC, and Lipid profile # Telemetry monitoring # Frequent neuro checks # NPO until passes stroke swallow screen # please page stroke NP  Or  PA  Or MD from 8am -4 pm  as this patient from this time will be  followed by the stroke.   You can look them up on [email or web address]  Password [email or web address]   Roland Rack, MD [facility] [phone]  If 7pm- 7am, please page neurology on call as listed in [place].

## 2019-01-12 NOTE — ED Provider Notes (Signed)
[facility] EMERGENCY DEPARTMENT Provider Note   CSN: [id number] Arrival date & time: 01/12/19  3382     History   Chief Complaint Chief Complaint  Patient presents with   Extremity Weakness    HPI ARIES TOWNLEY is a 58 y.o. male.     The history is provided by the patient and medical records. No language interpreter was used.  Neurologic Problem This is a new problem. The current episode started more than 2 days ago. The problem occurs constantly. The problem has been gradually worsening. Pertinent negatives include no chest pain, no abdominal pain, no headaches and no shortness of breath. Nothing aggravates the symptoms. Nothing relieves the symptoms. He has tried nothing for the symptoms. The treatment provided no relief.    Past Medical History:  Diagnosis Date   Arthritis    CHF (congestive heart failure) (HCC)    Combined systolic and diastolic cardiac dysfunction    Echo 11/03/2013 EF 50%, grade 3 diastolic dysfunction   Hypertension    NICM (nonischemic cardiomyopathy) ([place])    a. L/RHC (11/05/13): RA: 3, RV 52/5, PA 49/19 (31), PCWP 10, AO 166/93, PA 67%, Fick CO/CI: 5.71/2.97, Lmain: normal, LAD: large, without signficant dz, first diagonal has 20% dz at ostium, LCx: normal, RCA: 30% stenosis at the bifurcation of PDA and PLOM    Patient Active Problem List   Diagnosis Date Noted   Malnutrition of moderate degree 07/31/2018   Elevated serum protein level    Multiple myeloma (HCC)    Pancytopenia (HCC)    Normocytic normochromic anemia 07/16/2018   Acute kidney failure ([place]) 07/16/2018   NICM (nonischemic cardiomyopathy) ([place]) 11/12/2013   History of ETOH abuse 11/12/2013   Chronic combined systolic and diastolic CHF (congestive heart failure) ([place]) 53/97/6734   Acute systolic CHF (congestive heart failure), NYHA class 4 ([place]) 11/04/2013   Congestive dilated cardiomyopathy ([place]) 19/37/9024   Acute diastolic CHF (congestive  heart failure), NYHA class 4 ([place]) 11/02/2013   CHF (congestive heart failure) ([place]) 11/02/2013   Malignant hypertension     Past Surgical History:  Procedure Laterality Date   IR FLUORO GUIDE CV LINE RIGHT  07/25/2018   IR US GUIDE VASC ACCESS RIGHT  07/25/2018   LEFT AND RIGHT HEART CATHETERIZATION WITH CORONARY ANGIOGRAM N/A 11/05/2013   Procedure: LEFT AND RIGHT HEART CATHETERIZATION WITH CORONARY ANGIOGRAM;  Surgeon: Peter M Martinique, MD;  Location: [facility] CATH LAB;  Service: Cardiovascular;  Laterality: N/A;   RIGHT HEART CATHETERIZATION N/A 01/14/2014   Procedure: RIGHT HEART CATH;  Surgeon: Larey Dresser, MD;  Location: [facility] CATH LAB;  Service: Cardiovascular;  Laterality: N/A;        Home Medications    Prior to Admission medications   Medication Sig Start Date End Date Taking? Authorizing Provider  amLODipine (NORVASC) 10 MG tablet Take 1 tablet (10 mg total) by mouth daily. 08/04/18   Hongalgi, Lenis Dickinson, MD  cyclophosphamide (CYTOXAN) 50 MG capsule Take 2 capsules (100 mg total) by mouth daily. Give on an empty stomach 1 hour before or 2 hours after meals. 08/05/18   Hongalgi, Lenis Dickinson, MD  doxazosin (CARDURA) 4 MG tablet Take 1 tablet (4 mg total) by mouth 2 (two) times daily. 08/04/18   Hongalgi, Lenis Dickinson, MD  famotidine (PEPCID) 20 MG tablet Take 1 tablet (20 mg total) by mouth daily. 08/05/18   Hongalgi, Lenis Dickinson, MD  folic acid (FOLVITE) 1 MG tablet Take 1 tablet (1 mg total) by mouth  daily. 08/04/18   Modena Jansky, MD  multivitamin (RENA-VIT) TABS tablet Take 1 tablet by mouth at bedtime. 08/04/18   Hongalgi, Lenis Dickinson, MD  predniSONE (DELTASONE) 20 MG tablet Take 3 tablets (60 mg total) by mouth daily with breakfast. 08/05/18   Hongalgi, Lenis Dickinson, MD  sodium bicarbonate 650 MG tablet Take 1 tablet (650 mg total) by mouth 2 (two) times daily. 08/04/18   Hongalgi, Lenis Dickinson, MD  spironolactone (ALDACTONE) 25 MG tablet Take 1 tablet (25 mg total) by mouth 2 (two) times daily. 08/04/18    Hongalgi, Lenis Dickinson, MD  thiamine 100 MG tablet Take 1 tablet (100 mg total) by mouth daily. 08/04/18   Hongalgi, Lenis Dickinson, MD    Family History Family History  Problem Relation Age of Onset   Heart attack Father    Heart disease Father    Arrhythmia Father    Hypertension Brother    Hypertension Brother     Social History Social History   Tobacco Use   Smoking status: Never Smoker   Smokeless tobacco: Never Used  Substance Use Topics   Alcohol use: Yes    Alcohol/week: 3.0 standard drinks    Types: 3 Glasses of wine per week   Drug use: No     Allergies   Hydralazine hcl   Review of Systems Review of Systems  Constitutional: Negative for chills, diaphoresis, fatigue and fever.  HENT: Negative for congestion.   Eyes: Negative for photophobia and visual disturbance.  Respiratory: Negative for cough, chest tightness, shortness of breath, wheezing and stridor.   Cardiovascular: Negative for chest pain, palpitations and leg swelling.  Gastrointestinal: Negative for abdominal pain, constipation, diarrhea, nausea and vomiting.  Genitourinary: Negative for flank pain.  Musculoskeletal: Negative for back pain, neck pain and neck stiffness.  Neurological: Positive for weakness. Negative for dizziness, syncope, speech difficulty, light-headedness, numbness and headaches.  Psychiatric/Behavioral: Negative for agitation and confusion.  All other systems reviewed and are negative.    Physical Exam Updated Vital Signs BP (!) 156/87    Pulse 64    Resp 17    Ht _0  (1.854 m)    Wt 64.4 kg    SpO2 100%    BMI 18.73 kg/m   Physical Exam Vitals signs and nursing note reviewed.  Constitutional:      General: He is not in acute distress.    Appearance: He is well-developed. He is not ill-appearing, toxic-appearing or diaphoretic.  HENT:     Head: Normocephalic and atraumatic.     Nose: No congestion or rhinorrhea.     Mouth/Throat:     Pharynx: No oropharyngeal  exudate or posterior oropharyngeal erythema.  Eyes:     Extraocular Movements: Extraocular movements intact.     Conjunctiva/sclera: Conjunctivae normal.     Pupils: Pupils are equal, round, and reactive to light.  Neck:     Musculoskeletal: Neck supple. No muscular tenderness.  Cardiovascular:     Rate and Rhythm: Normal rate and regular rhythm.     Heart sounds: No murmur.  Pulmonary:     Effort: Pulmonary effort is normal. No respiratory distress.     Breath sounds: Normal breath sounds.  Abdominal:     Palpations: Abdomen is soft.     Tenderness: There is no abdominal tenderness. There is no right CVA tenderness or left CVA tenderness.  Musculoskeletal:        General: No tenderness.     Right lower leg: No edema.  Left lower leg: No edema.  Skin:    General: Skin is warm and dry.     Capillary Refill: Capillary refill takes less than 2 seconds.     Findings: No erythema.  Neurological:     Mental Status: He is alert and oriented to person, place, and time.     Cranial Nerves: No dysarthria or facial asymmetry.     Sensory: Sensation is intact. No sensory deficit.     Motor: Weakness present.     Coordination: Coordination normal.     Comments: Weakness in left leg with hip raise, knee extension, and plantar flexion.  Normal reflex on my exam.  Normal sensation in legs.  No other focal neurologic deficits on lateral exam with normal finger-nose-finger testing, grip strength, sensation in face and arms.  Normal pupil exam.  No facial droop.  Clear speech.  No visual complaints.  Psychiatric:        Mood and Affect: Mood normal.      ED Treatments / Results  Labs (all labs ordered are listed, but only abnormal results are displayed) Labs Reviewed  CBC WITH DIFFERENTIAL/PLATELET - Abnormal; Notable for the following components:      Result Value   WBC 3.5 (*)    RBC 2.37 (*)    Hemoglobin 8.8 (*)    HCT 24.3 (*)    MCV 102.5 (*)    MCH 37.1 (*)    MCHC 36.2 (*)      Platelets 115 (*)    Lymphs Abs 0.3 (*)    All other components within normal limits  COMPREHENSIVE METABOLIC PANEL - Abnormal; Notable for the following components:   Glucose, Bld 119 (*)    BUN 74 (*)    Creatinine, Ser 2.95 (*)    GFR calc non Af Amer 22 (*)    GFR calc Af Amer 26 (*)    All other components within normal limits  URINALYSIS, ROUTINE W REFLEX MICROSCOPIC - Abnormal; Notable for the following components:   Color, Urine STRAW (*)    Hgb urine dipstick MODERATE (*)    Protein, ur 100 (*)    Bacteria, UA RARE (*)    All other components within normal limits  CBC - Abnormal; Notable for the following components:   WBC 3.6 (*)    RBC 2.22 (*)    Hemoglobin 8.1 (*)    HCT 22.6 (*)    MCV 101.8 (*)    MCH 36.5 (*)    Platelets 114 (*)    All other components within normal limits  CREATININE, SERUM - Abnormal; Notable for the following components:   Creatinine, Ser 2.93 (*)    GFR calc non Af Amer 22 (*)    GFR calc Af Amer 26 (*)    All other components within normal limits  SARS CORONAVIRUS 2 (TAT 6-24 HRS)  HEMOGLOBIN A1C  PROTIME-INR  LIPID PANEL  BASIC METABOLIC PANEL  CBC    EKG EKG Interpretation  Date/Time:  Monday January 12 2019 09:40:25 EDT Ventricular Rate:  60 PR Interval:    QRS Duration: 106 QT Interval:  398 QTC Calculation: 398 R Axis:   -18 Text Interpretation:  Sinus rhythm Atrial premature complexes in couplets Left ventricular hypertrophy When comapred to prior, new PAC.  No STEMI Confirmed by Antony Blackbird [phone]) on 01/12/2019 8:16:51 PM   Radiology Mr Angio Head Wo Contrast  Result Date: 01/12/2019 CLINICAL DATA:  Left leg weakness. Right hemisphere more than left hemisphere  infarctions. EXAM: MRA HEAD WITHOUT CONTRAST TECHNIQUE: Angiographic images of the Circle of Willis were obtained using MRA technique without intravenous contrast. COMPARISON:  MRI brain same day FINDINGS: Both internal carotid arteries are widely patent  through the skull base and siphon regions. The anterior and middle cerebral vessels are patent proximally. No MCA territory abnormality is seen. The left anterior cerebral artery appears normal. There are abnormalities in the right anterior cerebral artery that could be stenoses or emboli. These are seen in the Pericallosal region. Both vertebral arteries are patent to the basilar. No basilar stenosis. Posterior circulation branch vessels are normal. IMPRESSION: Abnormal appearance of the right anterior cerebral artery in the pericallosal region most likely representing stenoses secondary to emboli. Electronically Signed   By: Nelson Chimes M.D.   On: 01/12/2019 14:56   Mr Brain Wo Contrast  Result Date: 01/12/2019 CLINICAL DATA:  Left leg muscle weakness. EXAM: MRI HEAD WITHOUT CONTRAST TECHNIQUE: Multiplanar, multiecho pulse sequences of the brain and surrounding structures were obtained without intravenous contrast. COMPARISON:  None. FINDINGS: Brain: Acute infarct in the deep white matter on the right involving the corpus callosum and frontal parietal deep white matter. Numerous small patchy areas of acute infarct on the right Facilitated diffusion in the left frontal white matter may be due to subacute infarct. Chronic microvascular ischemic changes in the white matter. Small chronic infarct left frontal cortex. Negative for mass or hydrocephalus. Mild atrophy. Chronic microhemorrhage right lateral cerebellum. No midline shift. Vascular: Normal arterial flow voids Skull and upper cervical spine: Negative Sinuses/Orbits: Mild mucosal edema paranasal sinuses.  Normal orbit Other: None IMPRESSION: Patchy areas of acute infarct in the deep white matter on the right including the corpus callosum. Possible watershed type infarct. Chronic microvascular ischemic changes in the white matter. Electronically Signed   By: Franchot Gallo M.D.   On: 01/12/2019 10:43   Mr Lumbar Spine Wo Contrast  Result Date:  01/12/2019 CLINICAL DATA:  Muscle weakness left leg pain EXAM: MRI LUMBAR SPINE WITHOUT CONTRAST TECHNIQUE: Multiplanar, multisequence MR imaging of the lumbar spine was performed. No intravenous contrast was administered. COMPARISON:  None. FINDINGS: Segmentation: There are 5 non-rib bearing lumbar type vertebral bodies with the last intervertebral disc space labeled as L5-S1. Alignment: There is a minimal anterolisthesis of L4 on L5 measuring 3 mm. A minimal retrolisthesis of L1 on L2 is seen. Vertebrae: The vertebral body heights are well maintained. No fracture, marrow edema,or pathologic marrow infiltration. Conus medullaris and cauda equina: Conus extends to the L1 level. Conus and cauda equina appear normal. Paraspinal and other soft tissues: The paraspinal soft tissues and visualized retroperitoneal structures are unremarkable. The sacroiliac joints are intact. Disc levels: T12-L1:  No significant canal or neural foraminal narrowing. L1-L2: There is a minimal broad-based disc bulge which causes mild left neural foraminal narrowing. L2-L3: There is a minimal broad-based disc bulge with ligamentum flavum hypertrophy which causes moderate bilateral neural foraminal narrowing. L3-L4: There is a broad-based disc bulge with ligamentum flavum hypertrophy which causes mild bilateral neural foraminal narrowing. L4-L5: There is a broad-based disc bulge with disc uncovering and a central disc protrusion. The disc protrusion contacts the bilateral descending L5 nerve roots. There is facet arthrosis and ligamentum flavum hypertrophy. There is large bulky right-sided osteophyte which causes effacement of the thecal sac and slight displacement to the left. Moderate bilateral neural foraminal narrowing is seen. There is mild central canal narrowing. L5-S1: There is a broad-based disc bulge with a central disc protrusion  and annular fissure. The disc protrusion contacts the right S1 nerve root. There is moderate bilateral  neural foraminal narrowing, left greater than right. IMPRESSION: 1. Grade 1 anterolisthesis of L4 on L5. 2. Lumbar spine spondylosis most notable at L4-L5 with a central disc protrusion contacting the bilateral descending L5 nerve roots and large bulky right-sided osteophytes which causes effacement of the thecal sac. There is moderate bilateral neural foraminal narrowing and mild central canal stenosis at this level. 3. Also at L5-S1 there is a central disc protrusion with annular fissure and the disc protrusion contacts the right S1 nerve root. Electronically Signed   By: Prudencio Pair M.D.   On: 01/12/2019 10:51    Procedures Procedures (including critical care time)  Medications Ordered in ED Medications  enoxaparin (LOVENOX) injection 30 mg (30 mg Subcutaneous Given 01/12/19 1813)  sodium chloride flush (NS) 0.9 % injection 3 mL (3 mLs Intravenous Given 01/12/19 1813)  aspirin tablet 325 mg (325 mg Oral Given 01/12/19 1813)  cyclophosphamide (CYTOXAN) capsule 100 mg (has no administration in time range)  predniSONE (DELTASONE) tablet 5 mg (has no administration in time range)  atorvastatin (LIPITOR) tablet 80 mg (80 mg Oral Given 01/12/19 1813)  clopidogrel (PLAVIX) tablet 300 mg (has no administration in time range)  clopidogrel (PLAVIX) tablet 75 mg (has no administration in time range)     Initial Impression / Assessment and Plan / ED Course  I have reviewed the triage vital signs and the nursing notes.  Pertinent labs & imaging results that were available during my care of the patient were reviewed by me and considered in my medical decision making (see chart for details).        JIA MOHAMED is a 58 y.o. male with a past medical history significant for hypertension, CHF, prior kidney failure needing a time of dialysis, prior pancytopenia, and recent shingles who presents with L leg weakness with falls.  Patient reports that he had a bad episode of shingles in his left leg  starting at the left hip area starting in May.  He reports that it was very painful but it has improved and nearly resolved.  He reports he Sabra Heck has pain but over the last few weeks has developed mild weakness in the left leg.  Reports of the last 3 days it acutely worsened and now he is having difficulty with walking and has had several falls because of the left leg giving out with weakness.  He denies any history of stroke or TIA.  He denies any back pain or trauma.  He denies any loss of bowel or bladder function.  He denies any neurologic symptoms otherwise with specifically no headaches, vision changes, nausea, vomiting, urinary symptoms, GI symptoms.  On exam, patient does have some weakness in his left leg with knee extension and hip raise compared to the right.  He also has weakness with plantar flexion on the left compared to right.  Normal sensation.  Normal finger-nose-finger testing grip strength and pulses in all extremities.  Lungs clear chest nontender.  Abdomen nontender.  Pupil exam unremarkable with normal extraocular movements.  Clear speech.  No facial droop.  Due to the unilateral weakness, will get MRI of the brain to look for stroke.  We will also get MRI of the low back given the report of recent shingles to make sure there is not some more local neurologic problem.  Anticipate follow-up on MRI to determine disposition.  Also get the screening blood  work given his history of pancytopenia and kidney problems.  Mri revealed stroke. Neuro to see and medicine will admit.    Final Clinical Impressions(s) / ED Diagnoses   Final diagnoses:  Weakness  C[facility]), [facility]    ED Discharge Orders    None      Clinical Impression: 1. Weakness   2. Cerebrovascular accident (CVA), unspecified mechanism ([place])     Disposition: Admit  This note was prepared with assistance of Dragon voice recognition software. Occasional wrong-word or  sound-a-like substitutions may have occurred due to the inherent limitations of voice recognition software.      Torrie Lafavor, Gwenyth Allegra, MD 01/12/19 2017

## 2019-01-12 NOTE — H&P (Addendum)
Date: 01/12/2019               Patient Name:  Jeremy Grant MRN: [id number]  DOB: [birth year] Age / Sex: 58 y.o., male   PCP: Jeremy Rasmussen, MD         Medical Service: Internal Medicine Teaching Service         Attending Physician: Dr. Rebeca Alert Raynaldo Opitz, MD    First Contact: Dr. Court Joy Pager: [phone]  Second Contact: Dr. 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 He is now having difficulty walking and has had 2 falls in the past 3 days. He was going to come to the ED after a clinic appointment today but came earlier due to difficulty getting into his car. He reports a severe episode of shingles in May. He denies other weakness, numbness, vision changes, headaches, fevers, chills, nausea, vomiting, constipation, or diarrhea.  In the ED, CMP and CBC showed BUN 74, Cr 2.9 (improving), WBC 3.5, Hgb 8.8 (stable), Plt 115 (stable). Neurology was consulted and MRI was performed showing left watershed infarct. Patient is to be admitted for further workup and care.  Meds:  Current Meds  Medication Sig  . amLODipine (NORVASC) 10 MG tablet Take 1 tablet (10 mg total) by mouth daily. (Patient taking differently: Take 10 mg by mouth at bedtime. )  . carvedilol (COREG) 12.5 MG tablet Take 12.5 mg by mouth 2 (two) times daily with a meal.   . cyclophosphamide (CYTOXAN) 50 MG capsule Take 2 capsules (100 mg total) by mouth daily. Give on an empty stomach 1 hour before or 2 hours after meals. (Patient taking differently: Take 100 mg by mouth daily at 12 noon. Give on an empty stomach 1 hour before or 2 hours after meals. )  . doxazosin (CARDURA) 4 MG  tablet Take 1 tablet (4 mg total) by mouth 2 (two) times daily.  . predniSONE (DELTASONE) 20 MG tablet Take 3 tablets (60 mg total) by mouth daily with breakfast. (Patient taking differently: Take 5 mg by mouth daily with breakfast. )  . [DISCONTINUED] famotidine (PEPCID) 20 MG tablet Take 1 tablet (20 mg total) by mouth daily.     Allergies: Allergies as of 01/12/2019 - Review Complete 01/12/2019  Allergen Reaction Noted  . Hydralazine hcl  07/20/2018   Past Medical History:  Diagnosis Date  . Arthritis   . CHF (congestive heart failure) ([place])   . Combined systolic and diastolic cardiac dysfunction    Echo 11/03/2013 EF 83%, grade 3 diastolic dysfunction  . Hypertension   . NICM (nonischemic cardiomyopathy) ([place])    a. L/RHC (11/05/13): RA: 3, RV 52/5, PA 49/19 (31), PCWP 10, AO 166/93, PA 67%, Fick CO/CI: 5.71/2.97, Lmain: normal, LAD: large, without signficant dz, first diagonal has 20% dz at ostium, LCx: normal, RCA: 30% stenosis at the bifurcation of PDA and PLOM    Family History:  Family History  Problem Relation Age of Onset  . Heart attack Father   . Heart disease Father   . Arrhythmia Father   . Hypertension Brother   . Hypertension Brother  Reviewed on admission  Social History:  Social History   Tobacco Use  . Smoking status: Never Smoker  . Smokeless tobacco: Never Used  Substance Use Topics  . Alcohol use: Yes    Alcohol/week: 3.0 standard drinks    Types: 3 Glasses of wine per week  . Drug use: No  Reviewed on admission  Review of Systems: A complete ROS was negative except as per HPI.   Physical Exam: Blood pressure (!) 181/83, pulse 75, temperature 98.1 F (36.7 C), temperature source Oral, resp. rate 16, height '6\' 1"'$  (1.854 m), weight 64.4 kg, SpO2 99 %. Physical Exam Constitutional:      General: He is not in acute distress.    Appearance: Normal appearance.  Cardiovascular:     Rate and Rhythm: Normal rate. Rhythm irregular.     Pulses:  Normal pulses.     Heart sounds: Normal heart sounds.     Comments: Trace systolic murmur Pulmonary:     Effort: Pulmonary effort is normal. No respiratory distress.     Breath sounds: Normal breath sounds.  Abdominal:     General: Bowel sounds are normal. There is no distension.     Palpations: Abdomen is soft.     Tenderness: There is no abdominal tenderness.  Musculoskeletal:        General: No swelling or deformity.  Skin:    General: Skin is warm and dry.  Neurological:     Comments: Mental Status: Patient is awake, alert, oriented x3 Cranial Nerves: II: Pupils equal, round, and reactive to light.  III,IV, VI: EOMI without ptosis or diploplia.  V: Facial sensation is symmetric tolight touch. VII: Facial movement is symmetric.  VIII: hearing is intact to voice X: Uvula elevates symmetrically XI: Shoulder shrug is symmetric. XII: tongue is midline without atrophy or fasciculations.  Motor: good effort thorughout, at Least 5/5 bilateral UE, 3/5 LLE 5/5 RLE Sensory: Sensation is grossly intact bilateral UEs & LEs  Cerebellar: Finger-Nose intact bilalat     EKG: personally reviewed my interpretation is sinus rhythm, PACs, likely LVH.  MR Brain: IMPRESSION: Patchy areas of acute infarct in the deep white matter on the right including the corpus callosum. Possible watershed type infarct. Chronic microvascular ischemic changes in the white matter.  MR L-Spine: IMPRESSION: 1. Grade 1 anterolisthesis of L4 on L5. 2. Lumbar spine spondylosis most notable at L4-L5 with a central disc protrusion contacting the bilateral descending L5 nerve roots and large bulky right-sided osteophytes which causes effacement of the thecal sac. There is moderate bilateral neural foraminal narrowing and mild central canal stenosis at this level. 3. Also at L5-S1 there is a central disc protrusion with annular fissure and the disc protrusion contacts the right S1 nerve root.  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  CVA: Presented with 3 days of progressive LLE weakness, no other deficits noted. MRI in ED consistent with L cerebral watershed infarct. Some irregular rhythm in ED with PACs on EKG, no Afib/Aflutter noted. - Appreciate Neurology Recommendarions - Permissive HTN (systolic < 620 and diastolic < 355) - ASA 974 mg / 81 mg daily  - Atorvastatin '80mg'$  Daily  - Echocardiogram w/ bubble study - MRA Head WO - Carotid doppler - A1C, Lipid panel  -  Tele monitoring  - Bedside swallow, SLP eval - PT/OT  Glomerulonephritis: Patient admitted in March for AKI. 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 He did required temporary HD, but came off of this and had his line removed about 1 months ago. Cr appears stable at 2.9 today, but unclear what most recent lab work showed. On Cyclophosphamide and Prednisone (tapering). - Continue Prednisone and Cyclophosphamide after passing swallow screen. - Monitor Renal Function  Pancytopenia: Underwent evaluation in March after presenting with leukopenia and anemia to Hgb ~5. He responded to transfusion appropriately. Heme/Onc was consulted and bone marrow biopsy was performed, which showed non-specifc, mildly hypercellular marrow with trilineage hematopoiesis and polytypic plasmacytosis. No evidence of multiple myeloma or other concerning findings and is following up outpatient. Leukopenia resolved by time of discharge. WBC is 3.5 today and Hgb is stable at 8.8. - Monitor CBC  Lumbar spine disease: Noted on MRI during weakness workup. Lumbar spondylosis (worst at L4-L5, disc protrusion contacting the L5 nerve roots), L5-S1 disc protrusion contacts the right S1 nerve  root.  CHF HTN: Most recent echocardiogram march showed normal EF and indeterminate diastolic parameters. - Holding Coreg, Amlodipine, Doxazosin for permissive HTN  FEN: Replete lytes prn, NPO until swallow screen VTE ppx: Lovenox Code Status: FULL  Dispo: Admit patient to Inpatient with expected length of stay greater than 2 midnights.  Signed: Neva Seat, MD 01/12/2019, 11:52 AM  Pager: [phone]

## 2019-01-12 NOTE — ED Triage Notes (Signed)
Pt is currently  In MRI Dept.

## 2019-01-12 NOTE — ED Notes (Signed)
Pt back from MRI 

## 2019-01-13 ENCOUNTER — Inpatient Hospital Stay (HOSPITAL_COMMUNITY): Payer: BC Managed Care – PPO

## 2019-01-13 DIAGNOSIS — I6300 Cerebral infarction due to thrombosis of unspecified precerebral artery: Secondary | ICD-10-CM

## 2019-01-13 DIAGNOSIS — I7789 Other specified disorders of arteries and arterioles: Secondary | ICD-10-CM

## 2019-01-13 DIAGNOSIS — R531 Weakness: Secondary | ICD-10-CM

## 2019-01-13 DIAGNOSIS — Z7952 Long term (current) use of systemic steroids: Secondary | ICD-10-CM

## 2019-01-13 DIAGNOSIS — I63521 Cerebral infarction due to unspecified occlusion or stenosis of right anterior cerebral artery: Secondary | ICD-10-CM

## 2019-01-13 LAB — RAPID URINE DRUG SCREEN, HOSP PERFORMED
Amphetamines: NOT DETECTED
Barbiturates: NOT DETECTED
Benzodiazepines: NOT DETECTED
Cocaine: NOT DETECTED
Opiates: NOT DETECTED
Tetrahydrocannabinol: NOT DETECTED

## 2019-01-13 LAB — CBC
HCT: 21.9 % — ABNORMAL LOW (ref 39.0–52.0)
Hemoglobin: 7.9 g/dL — ABNORMAL LOW (ref 13.0–17.0)
MCH: 36.2 pg — ABNORMAL HIGH (ref 26.0–34.0)
MCHC: 36.1 g/dL — ABNORMAL HIGH (ref 30.0–36.0)
MCV: 100.5 fL — ABNORMAL HIGH (ref 80.0–100.0)
Platelets: 109 10*3/uL — ABNORMAL LOW (ref 150–400)
RBC: 2.18 MIL/uL — ABNORMAL LOW (ref 4.22–5.81)
RDW: 13.6 % (ref 11.5–15.5)
WBC: 3.4 10*3/uL — ABNORMAL LOW (ref 4.0–10.5)
nRBC: 0 % (ref 0.0–0.2)

## 2019-01-13 LAB — BASIC METABOLIC PANEL
Anion gap: 10 (ref 5–15)
BUN: 71 mg/dL — ABNORMAL HIGH (ref 6–20)
CO2: 20 mmol/L — ABNORMAL LOW (ref 22–32)
Calcium: 9.1 mg/dL (ref 8.9–10.3)
Chloride: 111 mmol/L (ref 98–111)
Creatinine, Ser: 2.95 mg/dL — ABNORMAL HIGH (ref 0.61–1.24)
GFR calc Af Amer: 26 mL/min — ABNORMAL LOW (ref >60)
GFR calc non Af Amer: 22 mL/min — ABNORMAL LOW (ref >60)
Glucose, Bld: 125 mg/dL — ABNORMAL HIGH (ref 70–99)
Potassium: 3.9 mmol/L (ref 3.5–5.1)
Sodium: 141 mmol/L (ref 135–145)

## 2019-01-13 LAB — LIPID PANEL
Cholesterol: 193 mg/dL (ref 0–200)
HDL: 32 mg/dL — ABNORMAL LOW (ref >40)
LDL Cholesterol: 122 mg/dL — ABNORMAL HIGH (ref 0–99)
Total CHOL/HDL Ratio: 6 RATIO
Triglycerides: 197 mg/dL — ABNORMAL HIGH (ref <150)
VLDL: 39 mg/dL (ref 0–40)

## 2019-01-13 MED ORDER — STROKE: EARLY STAGES OF RECOVERY BOOK
Freq: Once | Status: AC
  Administered 2019-01-13: 09:00:00
  Filled 2019-01-13: qty 1

## 2019-01-13 MED ORDER — CYCLOPHOSPHAMIDE 25 MG PO CAPS
70.0000 mg | ORAL_CAPSULE | Freq: Every day | ORAL | Status: DC
  Administered 2019-01-14 – 2019-01-15 (×2): 75 mg via ORAL
  Filled 2019-01-13 (×2): qty 3

## 2019-01-13 NOTE — Progress Notes (Signed)
SLP Cancellation Note  Patient Details Name: Jeremy Grant MRN: [id number] DOB: Jan 04, 1961   Cancelled treatment:       Reason Eval/Treat Not Completed: SLP screened, no needs identified. Per RN, pt passed the [place] swallow screen, and has been tolerating po intake without difficulty. Please reconsult if needs arise. ST signing off.   Llana Deshazo [facility] [address] [facility], CCC-SLP Speech Language Pathologist [phone]  Shonna Chock 01/13/2019, 11:49 AM

## 2019-01-13 NOTE — Progress Notes (Signed)
STROKE TEAM PROGRESS NOTE   INTERVAL HISTORY I have personally reviewed history of presenting illness with the patient.  His wife is at the bedside.  He presented with several days history of left leg weakness and MRI scan shows embolic acute right anterior cerebral artery patchy infarcts as well as subacute left MCA infarcts as well.  MRA of the brain shows right pericallosal anterior cerebral artery stenosis possibly from emboli.  Carotid ultrasound showed no significant extracranial stenosis.  Transcranial Doppler bubble study was negative for PFO.  Transthoracic echo shows normal ejection fraction without cardiac source of embolism.  Urine drug screen is negative.  Patient has chronic pancytopenia and has had negative bone marrow biopsy in the past.  Vitals:   01/13/19 0820 01/13/19 0820 01/13/19 1037 01/13/19 1120  BP: (!) 154/82 (!) 154/82  (!) 165/85  Pulse: 65 65  73  Resp:  _0 Temp: 98.1 F (36.7 C) 98.1 F (36.7 C)  98.2 F (36.8 C)  TempSrc: Oral Oral  Oral  SpO2: 100% 100%  100%  Weight:      Height:        CBC:  Recent Labs  Lab 01/12/19 0928 01/12/19 1622 01/13/19 0303  WBC 3.5* 3.6* 3.4*  NEUTROABS 2.8  --   --   HGB 8.8* 8.1* 7.9*  HCT 24.3* 22.6* 21.9*  MCV 102.5* 101.8* 100.5*  PLT 115* 114* 109*    Basic Metabolic Panel:  Recent Labs  Lab 01/12/19 0928 01/12/19 1622 01/13/19 0303  NA 139  --  141  K 3.6  --  3.9  CL 107  --  111  CO2 22  --  20*  GLUCOSE 119*  --  125*  BUN 74*  --  71*  CREATININE 2.95* 2.93* 2.95*  CALCIUM 9.1  --  9.1   Lipid Panel:     Component Value Date/Time   CHOL 193 01/13/2019 0303   TRIG 197 (H) 01/13/2019 0303   HDL 32 (L) 01/13/2019 0303   CHOLHDL 6.0 01/13/2019 0303   VLDL 39 01/13/2019 0303   LDLCALC 122 (H) 01/13/2019 0303   HgbA1c:  Lab Results  Component Value Date   HGBA1C 5.4 01/12/2019   Urine Drug Screen:     Component Value Date/Time   LABOPIA NONE DETECTED 01/13/2019 1102   COCAINSCRNUR NONE DETECTED 01/13/2019 1102   LABBENZ NONE DETECTED 01/13/2019 1102   AMPHETMU NONE DETECTED 01/13/2019 1102   THCU NONE DETECTED 01/13/2019 1102   LABBARB NONE DETECTED 01/13/2019 1102    Alcohol Level No results found for: [place] middle-aged Caucasian male not in distress. . Afebrile. Head is nontraumatic. Neck is supple without bruit.    Cardiac exam no murmur or gallop. Lungs are clear to auscultation. Distal pulses are well felt.  ASSESSMENT/PLAN Jeremy Grant is a 58 y.o. male with history of nonischemic cardiomyopathy, hypertension, combination of systolic and diastolic cardiac dysfunction, congestive heart failure and arthritis presenting with L leg weakness.  Neurological Exam :  Awake alert oriented to time place and person.  Normal speech and language without dysarthria or aphasia.  Extraocular movements are full range without nystagmus.  Blinks to threat bilaterally.  Mild left lower facial weakness.  Tongue midline.  Motor system exam reveals no upper extremity drift.  Mild left lower extremity drift.  Normal strength in both upper and right lower extremities.  Diminished fine finger movements on the left.  Orbits right  over left upper extremity.  Mild 4/5 weakness of left hip flexors knee extensors and ankle dorsiflexors.  Sensation is preserved bilaterally.  Deep tendon reflexes are symmetric.  Plantars are downgoing. 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  Subacute and chronic left frontal MCA infarcts also of unknown embolic source  MRI  Patchy R corpus callosum infarct. Possible watershed. Small vessel disease.   MRA  R ACA stenosis in pericallosal region (?emboli)  Carotid Doppler  B ICA 1-39% stenosis, VAs antegrade   2D Echo EF 55%. No source of embolus   Given pancytopenia, may not be a good AC candidate. so will not consider TEE/loop now. Check TCD bubble study and 30d  cardiac monitor at d/c. - pending   LDL 122  HgbA1c 5.4  Lovenox 30 mg sq daily for VTE prophylaxis  No antithrombotic prior to admission, now on aspirin 325 mg daily and clopidogrel 75 mg daily following plavix load. Continue DAPT for 3 weeks then aspirin alone    Therapy recommendations:  pending   Disposition:  pending   Hypertension  Stable . Permissive hypertension (OK if < 220/120) but gradually normalize in 5-7 days . Long-term BP goal normotensive  Hyperlipidemia  Home meds:  No statin  Now on lipitor 80  LDL 122, goal < 70  Continue statin at discharge  Lumbar Spinal disease  MR LS grad 1 anterolisthesis of L4 on L5. spondyosis at L4-5 w/ central disc protruction on L5 nerve roots and effacement thecal sac. Mod B neural foraminal narrowing and mild central canal stenosis. Also disc protrusion at L5-S1 contacting the R S1 nerve root.  Other Stroke Risk Factors  ETOH use, alcohol level No results found, advised to drink no more than 2 drink(s) a day  Coronary artery disease  Systolic and diastolic Congestive heart failure  Nonischemic cardiomyopathy   Other Active Problems  Glomerulonephritis    Pancytopenia  Shingles in May w/ post herpetic neuralgia, now resolved  Hospital day # 1  I have personally obtained history,examined this patient, reviewed notes, independently viewed imaging studies, participated in medical decision making and plan of care.ROS completed by me personally and pertinent positives fully documented  I have made any additions or clarifications directly to the above note.  Patient has presented with acute embolic right anterior cerebral artery territory infarct as well as MRI shows evidence of previous small subacute left frontal and chronic infarcts as well likely of embolic etiology.  Patient did have recent shingles and shingles vasculitis is also consideration but clearly the left sided subacute and chronic infarcts predated his  onset of shingles hence this is unlikely to be the cause.  Patient has chronic pancytopenia with low platelet count and anemia he may not be a good long-term anticoagulation candidate and hence we will defer TEE and loop recorder.  Recommend antiplatelet therapy for now.  May consider doing 30-day outpatient heart monitor.  Continue physical occupational therapy and aggressive risk factor modification.  Discussed with patient and his wife and answered questions.  Greater than 50% time during this 35-minute visit was spent on counseling and coordination of care about his embolic strokes and discussion about evaluation treatment and answering questions  Jeremy Contras, MD Medical Director [place] Pager: [phone] 01/13/2019 3:28 PM   To contact Stroke Continuity provider, please refer to [email or web address] After hours, contact General Neurology

## 2019-01-13 NOTE — Progress Notes (Signed)
Subjective:  Denies any changes overnight. Continued weakness in left leg. Reports overall deconditioning since he had shingles . Looking forward to working with PT/OT.   Objective:  Vital signs in last 24 hours: Vitals:   01/13/19 0820 01/13/19 0820 01/13/19 1037 01/13/19 1120  BP: (!) 154/82 (!) 154/82  (!) 165/85  Pulse: 65 65  73  Resp:  '17 19 17  '$ Temp: 98.1 F (36.7 C) 98.1 F (36.7 C)  98.2 F (36.8 C)  TempSrc: Oral Oral  Oral  SpO2: 100% 100%  100%  Weight:      Height:       Physical Exam Constitutional:      Appearance: Normal appearance.  Cardiovascular:     Rate and Rhythm: Normal rate and regular rhythm.     Pulses: Normal pulses.     Heart sounds: Normal heart sounds.  Pulmonary:     Effort: Pulmonary effort is normal.     Breath sounds: Normal breath sounds.  Neurological:     Mental Status: He is alert and oriented to person, place, and time.     Comments: Mental Status: Patient is awake, alert, oriented x3 No signs of aphasia or neglect Cranial Nerves: II: Pupils equal, round, and reactive to light.  III,IV, VI: EOMI without ptosis or diploplia.  V: Facial sensation is symmetric tolight touch andTemperature. VII: Facial movement is symmetric.  VIII: hearing is intact to voice X: Uvula elevates symmetrically XI: Shoulder shrug is symmetric. XII: tongue is midline without atrophy or fasciculations.  Motor: 5/5 bilateral UE, 5/5 Right lower extremitiy, 4/5 Left Lower extremity Sensory: Sensation is grossly intact to light touch bilateral UEs & LEs Deep Tendon Reflexes are symmetrical  Cerebellar: Finger-Nose and Heel-Shin intact bilalat   Psychiatric:        Mood and Affect: Mood normal.        Behavior: Behavior normal.     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  CVA: Presented with 3 days of progressive LLE weakness, no other deficits noted. Multiple areas of infarct including R.Corpus callosum infarct, subacute and chronic left frontal MCA infarcts. R ACA stenosis in pericallosal region. LDL 122 , Hgb A1C 5.4. No PFO on bubble study. No embolic source seen on TTE, EF 55%. - Appreciate Neurology Recommendarions - Permissive HTN (systolic <086 and diastolic <578),  - ASA 469 mg / 81 mg daily  - Atorvastatin '80mg'$  Daily  -Tele monitoring  - PT/OT/SLP  Glomerulonephritis: Patient admitted in March for AKI. 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 He did required temporary HD, but came off of this and had his line removed about 1 months ago. Cr appears stable at 2.9 today. On Cyclophosphamide and Prednisone (tapering). - Continue Prednisone and Cyclophosphamide  - Monitor Renal Function - Consult nephrology   Pancytopenia: Underwent evaluation in March after presenting with leukopenia and anemia to Hgb ~5. He responded to transfusion appropriately. Heme/Onc was consulted and bone marrow biopsy was performed, which showed non-specifc, mildly hypercellular marrow with trilineage hematopoiesis and polytypic plasmacytosis. No evidence of multiple myeloma or other concerning findings and is following up outpatient. Leukopenia resolved by time of discharge. WBC is 3.4 today and Hgb is stable at 7.9. - Monitor CBC  Lumbar spine disease:  Noted on MRI during weakness workup. Lumbar spondylosis (worst at L4-L5, disc protrusion contacting the L5 nerve roots), L5-S1 disc protrusion contacts the right S1 nerve root.  HTN: Permissive HTN (systolic <373 and diastolic <428) - Holding Coreg, Amlodipine, Doxazosin for permissive HTN  Diet: Heart , Thin  VTE ppx: Lovenox Code Status:Full   Dispo: Anticipated discharge in approximately 3-4  day(s).   Tamsen Snider,  MD PGY1  [phone]

## 2019-01-13 NOTE — Progress Notes (Signed)
Carotid duplex       has been completed. Preliminary results can be found under CV proc through chart review. Nikelle Malatesta, BS, RDMS, RVT   

## 2019-01-13 NOTE — Evaluation (Addendum)
Physical Therapy Evaluation Patient Details Name: Jeremy Grant MRN: [id number] DOB: 1961-03-28 Today's Date: 01/13/2019   History of Present Illness  Pt is a 58 y/o M admitted on 01/12/2019 for LLE weakness x 3 days & inability to walk. 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 PMH significant for arthritis, CHF, combined systolic & diastolic cardiac dysfunction, HTN, nonischemic cardiomyopathy.  Clinical Impression   Pt is very motivated to participate & return to PLOF. Pt currently requires mod assist +2 for safety with gait with RW. Pt is limited by impaired strength & neuromuscular control of LLE and would benefit from CIR services to focus on balance, strengthening, gait, and to increase independence and reduce fall risk prior to d/c. Pt & family agreeable to f/u CIR.   Addendum: RN notified therapist of pt's HR increasing to 180bpm during gait with HR monitored rest of session and remained around 100 bpm with activity.     Follow Up Recommendations CIR    Equipment Recommendations  (TBD in next venue)    Recommendations for Other Services Rehab consult     Precautions / Restrictions Precautions Precautions: Fall Restrictions Weight Bearing Restrictions: No      Mobility  Bed Mobility Overal bed mobility: Needs Assistance Bed Mobility: Supine to Sit     Supine to sit: Min assist        Transfers Overall transfer level: Needs assistance Equipment used: 2 person hand held assist Transfers: Sit to/from Stand Sit to Stand: Min assist;+2 physical assistance            Ambulation/Gait Ambulation/Gait assistance: Mod assist;+2 safety/equipment Gait Distance (Feet): 35 Feet Assistive device: Rolling walker (2 wheeled)   Gait velocity: decreased   General Gait Details: pt with difficulty advancing RLE 2/2 hip flexor weakness & impaired dorsiflexion & foot clearance during swing phase, inconsistent step length & width  LLE  Stairs            Wheelchair Mobility    Modified Rankin (Stroke Patients Only) Modified Rankin (Stroke Patients Only) Pre-Morbid Rankin Score: Moderate disability Modified Rankin: Moderately severe disability     Balance Overall balance assessment: Needs assistance Sitting-balance support: Feet supported Sitting balance-Leahy Scale: Poor Sitting balance - Comments: pt dons sock sitting EOB with min/mod assist for dynamic sitting balance     Standing balance-Leahy Scale: Poor Standing balance comment: BUE support on RW during standing/gait                             Pertinent Vitals/Pain Pain Assessment: No/denies pain    Home Living Family/patient expects to be discharged to:: Private residence Living Arrangements: Spouse/significant other Available Help at Discharge: Available PRN/intermittently;Family Type of Home: House Home Access: Stairs to enter Entrance Stairs-Rails: Can reach both;Left;Right Entrance Stairs-Number of Steps: 3+4 Home Layout: One level Home Equipment: Cane - single point(rollator)      Prior Function Level of Independence: Independent         Comments: working at [employer] until March 2020     Hand Dominance        Extremity/Trunk Assessment                Communication   Communication: No difficulties  Cognition Arousal/Alertness: Awake/alert Behavior During Therapy: WFL for tasks assessed/performed Overall Cognitive Status: Within Functional Limits for tasks assessed(AXO x 4)  General Comments General comments (skin integrity, edema, etc.): pt significantly limted by LLE weakness, impaired balance    Exercises     Assessment/Plan    PT Assessment Patient needs continued PT services  PT Problem List Decreased strength;Decreased mobility;Decreased safety awareness;Decreased range of motion;Decreased coordination;Decreased knowledge of  precautions;Decreased activity tolerance;Decreased balance;Decreased knowledge of use of DME       PT Treatment Interventions DME instruction;Therapeutic exercise;Manual techniques;Balance training;Gait training;Stair training;Neuromuscular re-education;Functional mobility training;Therapeutic activities;Patient/family education    PT Goals (Current goals can be found in the Care Plan section)  Acute Rehab PT Goals Patient Stated Goal: to get better PT Goal Formulation: With patient/family Time For Goal Achievement: 01/27/19 Potential to Achieve Goals: Good    Frequency Min 4X/week   Barriers to discharge Decreased caregiver support pt's wife unable to provide physical assist 24/7 upon d/c    Co-evaluation               AM-PAC PT "6 Clicks" Mobility  Outcome Measure Help needed turning from your back to your side while in a flat bed without using bedrails?: A Little Help needed moving from lying on your back to sitting on the side of a flat bed without using bedrails?: A Little Help needed moving to and from a bed to a chair (including a wheelchair)?: A Lot Help needed standing up from a chair using your arms (e.g., wheelchair or bedside chair)?: A Little Help needed to walk in hospital room?: A Lot Help needed climbing 3-5 steps with a railing? : A Lot 6 Click Score: 15    End of Session Equipment Utilized During Treatment: Gait belt Activity Tolerance: Patient tolerated treatment well Patient left: in chair;with call bell/phone within reach;with chair alarm set   PT Visit Diagnosis: Other abnormalities of gait and mobility (R26.89);Muscle weakness (generalized) (M62.81);Difficulty in walking, not elsewhere classified (R26.2)    Time: 1517-6160 PT Time Calculation (min) (ACUTE ONLY): 25 min   Charges:   PT Evaluation $PT Eval Moderate Complexity: 1 Mod PT Treatments $Therapeutic Activity: 8-22 mins          Waunita Schooner, PT, DPT 01/13/2019, 1:35  PM

## 2019-01-13 NOTE — Consult Note (Signed)
[facility] Nephrology Consultation Note  Requesting MD: Gilles Chiquito Reason for consult: CKD.  HPI:  Jeremy Grant is a 58 y.o. male with history of hypertension, alcohol abuse, CHF, anemia, admitted in 07/2018 for severe anemia and AKI with creatinine level around 6.  At that time patient underwent bone marrow biopsy which revealed no hematologic malignancy.  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  He was positive for antihistone antibody when hydralazine was discontinued.  He required dialysis through a tunneled catheter until July.  Patient has been on cyclophosphamide 100 mg and prednisone with improving CKD.  He was taken off dialysis and tunnel catheter was discontinued.  He was seen at nephrology office on 01/08/2019 when serum creatinine level was a stabilized 2.96, WBC 2.4, hemoglobin 8.4.  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  He was seen by neurologist underwent imaging studies including MRI. Today, the labs showed a serum creatinine level stable at 2.95, BUN 71, potassium 3.9 hemoglobin 7.9, WBC trending down to 3.4 and platelet count 109.  Concern for pancytopenia and asked [place] to evaluate the immunosuppressant. Patient reports feeling good except leg weakness.  He denies headache, dizziness, nausea, vomiting, chest pain, shortness of breath.  He is nonoliguric.  PMHx:   Past Medical History:  Diagnosis Date  . Arthritis   . CHF (congestive heart failure) ([place])   . Combined systolic and diastolic cardiac dysfunction    Echo 11/03/2013 EF 35%, grade 3 diastolic dysfunction  . Hypertension   . NICM (nonischemic cardiomyopathy) ([place])    a. L/RHC (11/05/13): RA: 3, RV 52/5,  PA 49/19 (31), PCWP 10, AO 166/93, PA 67%, Fick CO/CI: 5.71/2.97, Lmain: normal, LAD: large, without signficant dz, first diagonal has 20% dz at ostium, LCx: normal, RCA: 30% stenosis at the bifurcation of PDA and PLOM    Past Surgical History:  Procedure Laterality Date  . IR FLUORO GUIDE CV LINE RIGHT  07/25/2018  . IR US GUIDE VASC ACCESS RIGHT  07/25/2018  . LEFT AND RIGHT HEART CATHETERIZATION WITH CORONARY ANGIOGRAM N/A 11/05/2013   Procedure: LEFT AND RIGHT HEART CATHETERIZATION WITH CORONARY ANGIOGRAM;  Surgeon: Peter M Martinique, MD;  [facility] LAB;  Service: Cardiovascular;  Laterality: N/A;  . RIGHT HEART CATHETERIZATION N/A 01/14/2014   Procedure: RIGHT HEART CATH;  Surgeon: Larey Dresser, MD;  Location: [facility] C[facility] LAB;  Service: Cardiovascular;  Laterality: N/A;    Family Hx:  Family History  Problem Relation Age of Onset  . Heart attack Father   . Heart disease Father   . Arrhythmia Father   . Hypertension Brother   . Hypertension Brother     Social History:  reports that he has never smoked. He has never used smokeless tobacco. He reports current alcohol use of about 3.0 standard drinks of alcohol per week. He reports that he does not use drugs.  Allergies:  Allergies  Allergen Reactions  . Hydralazine Hcl     Medications: Prior to Admission medications   Medication Sig Start Date End Date Taking? Authorizing Provider  amLODipine (NORVASC) 10 MG tablet Take 1 tablet (10 mg total) by mouth daily. Patient taking differently: Take 10 mg by mouth at bedtime.  08/04/18  Yes Hongalgi, Lenis Dickinson, MD  carvedilol (COREG) 12.5  MG tablet Take 12.5 mg by mouth 2 (two) times daily with a meal.  01/08/19  Yes [provider]  cyclophosphamide (CYTOXAN) 50 MG capsule Take 2 capsules (100 mg total) by mouth daily. Give on an empty stomach 1 hour before or 2 hours after meals. Patient taking differently: Take 100 mg by mouth daily at 12 noon. Give on an empty stomach 1 hour  before or 2 hours after meals.  08/05/18  Yes Hongalgi, Lenis Dickinson, MD  doxazosin (CARDURA) 4 MG tablet Take 1 tablet (4 mg total) by mouth 2 (two) times daily. 08/04/18  Yes Hongalgi, Lenis Dickinson, MD  predniSONE (DELTASONE) 20 MG tablet Take 3 tablets (60 mg total) by mouth daily with breakfast. Patient taking differently: Take 5 mg by mouth daily with breakfast.  08/05/18  Yes Hongalgi, Lenis Dickinson, MD  multivitamin (RENA-VIT) TABS tablet Take 1 tablet by mouth at bedtime. Patient not taking: Reported on 01/12/2019 08/04/18   Modena Jansky, MD    I have reviewed the patient's current medications.  Labs:  Results for orders placed or performed during the hospital encounter of 01/12/19 (from the past 48 hour(s))  CBC with Differential     Status: Abnormal   Collection Time: 01/12/19  9:28 AM  Result Value Ref Range   WBC 3.5 (L) 4.0 - 10.5 K/uL   RBC 2.37 (L) 4.22 - 5.81 MIL/uL   Hemoglobin 8.8 (L) 13.0 - 17.0 g/dL   HCT 24.3 (L) 39.0 - 52.0 %   MCV 102.5 (H) 80.0 - 100.0 fL   MCH 37.1 (H) 26.0 - 34.0 pg   MCHC 36.2 (H) 30.0 - 36.0 g/dL   RDW 13.8 11.5 - 15.5 %   Platelets 115 (L) 150 - 400 K/uL    Comment: REPEATED TO VERIFY PLATELET COUNT CONFIRMED BY SMEAR Immature Platelet Fraction may be clinically indicated, consider ordering this additional test [id number]    nRBC 0.0 0.0 - 0.2 %   Neutrophils Relative % 78 %   Neutro Abs 2.8 1.7 - 7.7 K/uL   Lymphocytes Relative 9 %   Lymphs Abs 0.3 (L) 0.7 - 4.0 K/uL   Monocytes Relative 10 %   Monocytes Absolute 0.3 0.1 - 1.0 K/uL   Eosinophils Relative 1 %   Eosinophils Absolute 0.0 0.0 - 0.5 K/uL   Basophils Relative 1 %   Basophils Absolute 0.0 0.0 - 0.1 K/uL   Immature Granulocytes 1 %   Abs Immature Granulocytes 0.03 0.00 - 0.07 K/uL    Comment: Performed at [facility], [address], [place], [place] [address]  Comprehensive metabolic panel     Status: Abnormal   Collection Time: 01/12/19  9:28 AM  Result Value Ref Range    Sodium 139 135 - 145 mmol/L   Potassium 3.6 3.5 - 5.1 mmol/L   Chloride 107 98 - 111 mmol/L   CO2 22 22 - 32 mmol/L   Glucose, Bld 119 (H) 70 - 99 mg/dL   BUN 74 (H) 6 - 20 mg/dL   Creatinine, Ser 2.95 (H) 0.61 - 1.24 mg/dL   Calcium 9.1 8.9 - 10.3 mg/dL   Total Protein 7.1 6.5 - 8.1 g/dL   Albumin 4.1 3.5 - 5.0 g/dL   AST 15 15 - 41 U/L   ALT 19 0 - 44 U/L   Alkaline Phosphatase 46 38 - 126 U/L   Total Bilirubin 0.9 0.3 - 1.2 mg/dL   GFR calc non Af Amer 22 (L) >60  mL/min   GFR calc Af Amer 26 (L) >60 mL/min   Anion gap 10 5 - 15    Comment: Performed at [facility] [address], [place], [place] [address]  Urinalysis, Routine w reflex microscopic     Status: Abnormal   Collection Time: 01/12/19  9:41 AM  Result Value Ref Range   Color, Urine STRAW (A) YELLOW   APPearance CLEAR CLEAR   Specific Gravity, Urine 1.011 1.005 - 1.030   pH 5.0 5.0 - 8.0   Glucose, UA NEGATIVE NEGATIVE mg/dL   Hgb urine dipstick MODERATE (A) NEGATIVE   Bilirubin Urine NEGATIVE NEGATIVE   Ketones, ur NEGATIVE NEGATIVE mg/dL   Protein, ur 100 (A) NEGATIVE mg/dL   Nitrite NEGATIVE NEGATIVE   Leukocytes,Ua NEGATIVE NEGATIVE   RBC / HPF 0-5 0 - 5 RBC/hpf   WBC, UA 0-5 0 - 5 WBC/hpf   Bacteria, UA RARE (A) NONE SEEN   Mucus PRESENT     Comment: Performed at [facility] [address] [place], [place] [address]  SARS CORONAVIRUS 2 (TAT 6-24 HRS) Nasopharyngeal Nasopharyngeal Swab     Status: None   Collection Time: 01/12/19 11:05 AM   Specimen: Nasopharyngeal Swab  Result Value Ref Range   SARS Coronavirus 2 NEGATIVE NEGATIVE    Comment: (NOTE) SARS-CoV-2 target nucleic acids are NOT DETECTED. The SARS-CoV-2 RNA is generally detectable in upper and lower respiratory specimens during the acute phase of infection. Negative results do not preclude SARS-CoV-2 infection, do not rule out co-infections with other pathogens, and should not be used as the sole basis for treatment or other  patient management decisions. Negative results must be combined with clinical observations, patient history, and epidemiological information. The expected result is Negative. Fact Sheet for Patients: SugarRoll.be Fact Sheet for Healthcare Providers: https://www.woods-mathews.com/ This test is not yet approved or cleared by the [place] FDA and  has been authorized for detection and/or diagnosis of SARS-CoV-2 by FDA under an Emergency Use Authorization (EUA). This EUA will remain  in effect (meaning this test can be used) for the duration of the COVID-19 declaration under Section 56 4(b)(1) of the Act, 21 U.S.C. section 360bbb-3(b)(1), unless the authorization is terminated or revoked sooner. Performed at [facility], [place] [address] [place], [place] [address]   CBC     Status: Abnormal   Collection Time: 01/12/19  4:22 PM  Result Value Ref Range   WBC 3.6 (L) 4.0 - 10.5 K/uL   RBC 2.22 (L) 4.22 - 5.81 MIL/uL   Hemoglobin 8.1 (L) 13.0 - 17.0 g/dL   HCT 22.6 (L) 39.0 - 52.0 %   MCV 101.8 (H) 80.0 - 100.0 fL   MCH 36.5 (H) 26.0 - 34.0 pg   MCHC 35.8 30.0 - 36.0 g/dL   RDW 13.7 11.5 - 15.5 %   Platelets 114 (L) 150 - 400 K/uL    Comment: REPEATED TO VERIFY PLATELET COUNT CONFIRMED BY SMEAR Immature Platelet Fraction may be clinically indicated, consider ordering this additional test [id number]    nRBC 0.0 0.0 - 0.2 %    Comment: Performed at [facility], [place] [address] [place], [place] [address]  Creatinine, serum     Status: Abnormal   Collection Time: 01/12/19  4:22 PM  Result Value Ref Range   Creatinine, Ser 2.93 (H) 0.61 - 1.24 mg/dL   GFR calc non Af Amer 22 (L) >60 mL/min   GFR calc Af Wyvonnia Lora  26 (L) >60 mL/min    Comment: Performed at [facility], [place] [address], [place], [place] [address]  Hemoglobin A1c     Status: None   Collection Time: 01/12/19  4:22 PM  Result Value Ref Range   Hgb A1c MFr Bld  5.4 4.8 - 5.6 %    Comment: (NOTE) Pre diabetes:          5.7%-6.4% Diabetes:              >6.4% Glycemic control for   <7.0% adults with diabetes    Mean Plasma Glucose 108.28 mg/dL    Comment: Performed at [facility] [address] [place], [place] [address]  Protime-INR     Status: None   Collection Time: 01/12/19  4:22 PM  Result Value Ref Range   Prothrombin Time 13.0 11.4 - 15.2 seconds   INR 1.0 0.8 - 1.2    Comment: (NOTE) INR goal varies based on device and disease states. Performed at [facility], [place] [address] [place], [place] [address]   Lipid panel     Status: Abnormal   Collection Time: 01/13/19  3:03 AM  Result Value Ref Range   Cholesterol 193 0 - 200 mg/dL   Triglycerides 197 (H) <150 mg/dL   HDL 32 (L) >40 mg/dL   Total CHOL/HDL Ratio 6.0 RATIO   VLDL 39 0 - 40 mg/dL   LDL Cholesterol 122 (H) 0 - 99 mg/dL    Comment:        Total Cholesterol/HDL:CHD Risk Coronary Heart Disease Risk Table                     Men   Women  1/2 Average Risk   3.4   3.3  Average Risk       5.0   4.4  2 X Average Risk   9.6   7.1  3 X Average Risk  23.4   11.0        Use the calculated Patient Ratio above and the CHD Risk Table to determine the patient's CHD Risk.        ATP III CLASSIFICATION (LDL):  <100     mg/dL   Optimal  100-129  mg/dL   Near or Above                    Optimal  130-159  mg/dL   Borderline  160-189  mg/dL   High  >190     mg/dL   Very High Performed at [facility] [address], [place], [place] [address]   Basic metabolic panel     Status: Abnormal   Collection Time: 01/13/19  3:03 AM  Result Value Ref Range   Sodium 141 135 - 145 mmol/L   Potassium 3.9 3.5 - 5.1 mmol/L   Chloride 111 98 - 111 mmol/L   CO2 20 (L) 22 - 32 mmol/L   Glucose, Bld 125 (H) 70 - 99 mg/dL   BUN 71 (H) 6 - 20 mg/dL   Creatinine, Ser 2.95 (H) 0.61 - 1.24 mg/dL   Calcium 9.1 8.9 - 10.3 mg/dL   GFR calc non Af Amer 22 (L) >60 mL/min    GFR calc Af Amer 26 (L) >60 mL/min   Anion gap 10 5 - 15    Comment: Performed at [facility] [address] [place],  [address]  CBC  Status: Abnormal   Collection Time: 01/13/19  3:03 AM  Result Value Ref Range   WBC 3.4 (L) 4.0 - 10.5 K/uL   RBC 2.18 (L) 4.22 - 5.81 MIL/uL   Hemoglobin 7.9 (L) 13.0 - 17.0 g/dL   HCT 21.9 (L) 39.0 - 52.0 %   MCV 100.5 (H) 80.0 - 100.0 fL   MCH 36.2 (H) 26.0 - 34.0 pg   MCHC 36.1 (H) 30.0 - 36.0 g/dL   RDW 13.6 11.5 - 15.5 %   Platelets 109 (L) 150 - 400 K/uL    Comment: REPEATED TO VERIFY Immature Platelet Fraction may be clinically indicated, consider ordering this additional test [id number] CONSISTENT WITH PREVIOUS RESULT    nRBC 0.0 0.0 - 0.2 %    Comment: Performed at [facility], [place] [address], [place], [place] [address]  Rapid urine drug screen (hospital performed)     Status: None   Collection Time: 01/13/19 11:02 AM  Result Value Ref Range   Opiates NONE DETECTED NONE DETECTED   Cocaine NONE DETECTED NONE DETECTED   Benzodiazepines NONE DETECTED NONE DETECTED   Amphetamines NONE DETECTED NONE DETECTED   Tetrahydrocannabinol NONE DETECTED NONE DETECTED   Barbiturates NONE DETECTED NONE DETECTED    Comment: (NOTE) DRUG SCREEN FOR MEDICAL PURPOSES ONLY.  IF CONFIRMATION IS NEEDED FOR ANY PURPOSE, NOTIFY LAB WITHIN 5 DAYS. LOWEST DETECTABLE LIMITS FOR URINE DRUG SCREEN Drug Class                     Cutoff (ng/mL) Amphetamine and metabolites    1000 Barbiturate and metabolites    200 Benzodiazepine                 287 Tricyclics and metabolites     300 Opiates and metabolites        300 Cocaine and metabolites        300 THC                            50 Performed at [facility], [place] [address], [place], [place] [address]      ROS:  Pertinent items noted in HPI and remainder of comprehensive ROS otherwise negative.  Physical Exam: Vitals:   01/13/19 1120 01/13/19 1614  BP: (!) 165/85  (!) 170/91  Pulse: 73 70  Resp: 17 15  Temp: 98.2 F (36.8 C) 98 F (36.7 C)  SpO2: 100% 100%     General exam: Appears calm and comfortable  Respiratory system: Clear to auscultation. Respiratory effort normal. No wheezing or crackle Cardiovascular system: S1 & S2 heard, RRR.  No pedal edema. Gastrointestinal system: Abdomen is nondistended, soft and nontender. Normal bowel sounds heard. Central nervous system: Alert and oriented. No focal neurological deficits. Extremities: Left leg weakness, no edema Skin: No rashes, lesions or ulcers Psychiatry: Judgement and insight appear normal. Mood & affect appropriate.   Assessment/Plan:  #CKD stage IV: The renal function is gradually improving.  He is off of dialysis and the tunnel catheter was discontinued few weeks ago.  His recent baseline serum creatinine level around 2.9.  He is nonoliguric and here serum creatinine level remains a stable.  No need for dialysis.  #MPO-ANCA necrotizing crescentic GN: With anti-histone positive likely due to hydralazine: He was treated with Cytoxan 100 mg daily and prednisone taper.  The renal function gradually improved to the point that he is now off of dialysis.  Noticed that the pancytopenia is  mildly worsened and there is a plan to switch to CellCept or Imuran in upcoming appointment.  I will lower the dose of Cytoxan to 75 mg daily.  Recommend to repeat CBC and renal panel in a week.  Patient has follow-up at [facility] in 2-3 weeks.   #Pancytopenia: ANC acceptable.  Platelet count chronically low and stable.  No sign of bleeding.  He gets IV iron and Aranesp as outpatient.  #Hypertension: Blood pressure elevated.  Antihypertensives are on hold now because of acute stroke.  Recommend to resume antihypertensive medications when okay from neurology.  #History of hyperkalemia: He is to be on Lokelma, off now.  Serum potassium level acceptable at 3.9 today.  I will sign off at this time.  Call us back with  question.  Patient will follow-up at [facility].  Per patient he has appointment in 2 to 3 weeks.  Glynna Failla Tanna Furry 01/13/2019, 4:21 PM  [facility].

## 2019-01-13 NOTE — Evaluation (Signed)
Occupational Therapy Evaluation Patient Details Name: Jeremy Grant MRN: [id number] DOB: 01/13/1961 Today's Date: 01/13/2019    History of Present Illness Pt is a 58 y/o M admitted on 01/12/2019 for LLE weakness x 3 days & inability to walk. 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 PMH significant for arthritis, CHF, combined systolic & diastolic cardiac dysfunction, HTN, nonischemic cardiomyopathy.   Clinical Impression   Pt admitted with above diagnosis and presents to OT with impairments effecting ability to complete ADLs at [facility].  Pt currently requires mod assist for stand pivot transfers and LB bathing and dressing due to LLE instability and weakness.  Pt demonstrates mild LUE decreased coordination but able to open grooming items.  Pt would benefit from CIR services to increase independence with ADLs and functional mobility prior to d/c home.     Follow Up Recommendations  CIR    Equipment Recommendations  Tub/shower bench    Recommendations for Other Services Rehab consult     Precautions / Restrictions Precautions Precautions: Fall Restrictions Weight Bearing Restrictions: No      Mobility Bed Mobility Overal bed mobility: Needs Assistance Bed Mobility: Supine to Sit     Supine to sit: Min assist        Transfers Overall transfer level: Needs assistance Equipment used: Rolling walker (2 wheeled) Transfers: Sit to/from [facility] Sit to Stand: Min assist Stand pivot transfers: Min assist;Mod assist            Balance Overall balance assessment: Needs assistance Sitting-balance support: Feet supported Sitting balance-Leahy Scale: Poor Sitting balance - Comments: pt dons sock sitting EOB with min/mod assist for dynamic sitting balance     Standing balance-Leahy Scale: Poor Standing balance comment: BUE support on RW during standing/gait                           ADL either  performed or assessed with clinical judgement   ADL Overall ADL's : Needs assistance/impaired Eating/Feeding: Set up   Grooming: Minimal assistance;Standing   Upper Body Bathing: Min guard;Sitting   Lower Body Bathing: Minimal assistance;Moderate assistance;Sit to/from stand   Upper Body Dressing : Min guard;Sitting   Lower Body Dressing: Moderate assistance;Sit to/from stand   Toilet Transfer: Moderate assistance;BSC;Stand-pivot Toilet Transfer Details (indicate cue type and reason): simulated stand pivot transfer to recliner.  Min assist sit > stand and mod assist for control and blocking of LLE during transfer         Functional mobility during ADLs: Minimal assistance;Moderate assistance;Rolling walker       Vision Baseline Vision/History: Wears glasses Wears Glasses: At all times Patient Visual Report: No change from baseline Vision Assessment?: Yes Eye Alignment: Within Functional Limits Ocular Range of Motion: Within Functional Limits Tracking/Visual Pursuits: Able to track stimulus in all quads without difficulty            Pertinent Vitals/Pain Pain Assessment: No/denies pain     Hand Dominance Right   Extremity/Trunk Assessment Upper Extremity Assessment Upper Extremity Assessment: LUE deficits/detail LUE Deficits / Details: decreased motor control in LUE, mild dysmetria with finger to nose LUE Sensation: WNL LUE Coordination: decreased fine motor           Communication Communication Communication: No difficulties   Cognition Arousal/Alertness: Awake/alert Behavior During Therapy: WFL for tasks assessed/performed Overall Cognitive Status: Within Functional Limits for tasks assessed  General Comments  pt significantly limted by LLE weakness, impaired balance            Home Living Family/patient expects to be discharged to:: Private residence Living Arrangements: Spouse/significant  other Available Help at Discharge: Available PRN/intermittently;Family Type of Home: House Home Access: Stairs to enter [employer] of Steps: 3+4 Entrance Stairs-Rails: Can reach both;Left;Right Home Layout: One level     Bathroom Shower/Tub: Teacher, early years/pre: Standard Bathroom Accessibility: Yes How Accessible: Accessible via wheelchair Home Equipment: [place] - 4 wheels;Cane - single point;Hand held shower head          Prior Functioning/Environment Level of Independence: Independent        Comments: working at [employer] until March 2020        OT Problem List: Decreased strength;Decreased range of motion;Decreased activity tolerance;Impaired balance (sitting and/or standing);Decreased coordination;Decreased knowledge of use of DME or AE;Cardiopulmonary status limiting activity      OT Treatment/Interventions: Self-care/ADL training;Neuromuscular education;DME and/or AE instruction;Therapeutic exercise;Therapeutic activities;Energy conservation;Patient/family education;Balance training    OT Goals(Current goals can be found in the care plan section) Acute Rehab OT Goals Patient Stated Goal: to get better OT Goal Formulation: With patient Time For Goal Achievement: 01/27/19 Potential to Achieve Goals: Good  OT Frequency: Min 2X/week   Barriers to D/C: Decreased caregiver support   wife unable to take time off work          AM-PAC OT "6 Clicks" Daily Activity     Outcome Measure Help from another person eating meals?: None Help from another person taking care of personal grooming?: A Little Help from another person toileting, which includes using toliet, bedpan, or urinal?: A Little Help from another person bathing (including washing, rinsing, drying)?: A Little Help from another person to put on and taking off regular upper body clothing?: A Little Help from another person to put on and taking off regular lower body clothing?: A Lot 6  Click Score: 18   End of Session Equipment Utilized During Treatment: Gait belt;Rolling walker Nurse Communication: Mobility status  Activity Tolerance: Patient tolerated treatment well Patient left: in bed;with call bell/phone within reach;with bed alarm set;with family/visitor present  OT Visit Diagnosis: Unsteadiness on feet (R26.81);History of falling (Z91.81);Hemiplegia and hemiparesis Hemiplegia - Right/Left: Left Hemiplegia - dominant/non-dominant: Non-Dominant Hemiplegia - caused by: Cerebral infarction                Time: 2025-4270 OT Time Calculation (min): 27 min Charges:  OT General Charges $OT Visit: 1 Visit OT Evaluation $OT Eval Moderate Complexity: 1 Mod OT Treatments $Self Care/Home Management : 8-22 mins   Simonne Come, [phone] 01/13/2019, 3:26 PM

## 2019-01-13 NOTE — Progress Notes (Signed)
Rehab Admissions Coordinator Note:  Patient was screened by Cleatrice Burke for appropriateness for an Inpatient Acute Rehab Consult per PT recs. .  At this time, we are recommending Inpatient Rehab consult if pt would like to be considered for admit. Please advise.  Cleatrice Burke RN MSN 01/13/2019, 1:51 PM  I can be reached at [phone].

## 2019-01-14 MED ORDER — ACETAMINOPHEN 325 MG PO TABS
650.0000 mg | ORAL_TABLET | Freq: Four times a day (QID) | ORAL | Status: DC | PRN
  Administered 2019-01-14 – 2019-01-15 (×2): 650 mg via ORAL
  Filled 2019-01-14 (×2): qty 2

## 2019-01-14 NOTE — Progress Notes (Signed)
Physical Therapy Treatment Patient Details Name: Jeremy Grant MRN: [id number] DOB: 1960/06/19 Today's Date: 01/14/2019    History of Present Illness Pt is a 58 y/o M admitted on 01/12/2019 for LLE weakness x 3 days & inability to walk. 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 PMH significant for arthritis, CHF, combined systolic & diastolic cardiac dysfunction, HTN, nonischemic cardiomyopathy.    PT Comments    Pt is making good progress with functional mobility as he was able to increase ambulation distance on this date with less assistance required. Therapist still provides cuing to help prevent gait abnormalities noted below 2/2 impaired neuromuscular control & strength in LLE. Pt is unable to perform LLE hip/knee flexion in static standing 2/2 impaired strength but performed standing mini squats with BUE & min assist for strengthening with cuing for safety. Pt is a great candidate for CIR to focus on increasing independence in functional mobility prior to d/c home.    Follow Up Recommendations  CIR     Equipment Recommendations  (TBD in next venue)    Recommendations for Other Services Rehab consult     Precautions / Restrictions Precautions Precautions: Fall Restrictions Weight Bearing Restrictions: No    Mobility  Bed Mobility Overal bed mobility: Needs Assistance Bed Mobility: Supine to Sit     Supine to sit: Supervision     General bed mobility comments: extra time & cuing to initiate supine>sit  Transfers Overall transfer level: Needs assistance Equipment used: Rolling walker (2 wheeled) Transfers: Sit to/from Stand   Stand pivot transfers: Min assist       General transfer comment: occasional impaired ability to weight bear through LLE for support during sit>stand, very impaired safety awareness for stand>sit as pt abruptly sits in recliner without ensuring he's close enough or holding to  armrests  Ambulation/Gait Ambulation/Gait assistance: Min assist Gait Distance (Feet): 50 Feet Assistive device: Rolling walker (2 wheeled)   Gait velocity: decreased   General Gait Details: decreased LLE hip/knee flexion during swing phase, absent heel strike LLE, LLE genu recurvatum in stance phase with therapist providing tactile cuing to help prevent   Stairs             Wheelchair Mobility    Modified Rankin (Stroke Patients Only)       Balance Overall balance assessment: Needs assistance Sitting-balance support: Feet supported;Bilateral upper extremity supported   Sitting balance - Comments: L lateral lean during static sitting balance with cuing to correct     Standing balance-Leahy Scale: Poor Standing balance comment: BUE support on RW during standing/gait                            Cognition Arousal/Alertness: Awake/alert Behavior During Therapy: WFL for tasks assessed/performed Overall Cognitive Status: Within Functional Limits for tasks assessed                                        Exercises      General Comments General comments (skin integrity, edema, etc.): pt motivated by improvement and reports he feels his LLE is working better, pt's & wife hopeful pt can d/c to CIR      Pertinent Vitals/Pain Pain Assessment: No/denies pain    Home Living  Prior Function            PT Goals (current goals can now be found in the care plan section) Acute Rehab PT Goals Patient Stated Goal: to get better PT Goal Formulation: With patient/family Time For Goal Achievement: 01/27/19 Potential to Achieve Goals: Good Progress towards PT goals: Progressing toward goals    Frequency    Min 4X/week      PT Plan Current plan remains appropriate    Co-evaluation              AM-PAC PT "6 Clicks" Mobility   Outcome Measure  Help needed turning from your back to your side while in a  flat bed without using bedrails?: None Help needed moving from lying on your back to sitting on the side of a flat bed without using bedrails?: None Help needed moving to and from a bed to a chair (including a wheelchair)?: A Little Help needed standing up from a chair using your arms (e.g., wheelchair or bedside chair)?: A Little Help needed to walk in hospital room?: A Little Help needed climbing 3-5 steps with a railing? : A Lot 6 Click Score: 19    End of Session Equipment Utilized During Treatment: Gait belt Activity Tolerance: Patient tolerated treatment well Patient left: in chair;with call bell/phone within reach;with chair alarm set;with family/visitor present Nurse Communication: (telemetry lead coming off during session) PT Visit Diagnosis: Other abnormalities of gait and mobility (R26.89);Muscle weakness (generalized) (M62.81);Difficulty in walking, not elsewhere classified (R26.2)     Time: 6812-7517 PT Time Calculation (min) (ACUTE ONLY): 20 min  Charges:  $Neuromuscular Re-education: 8-22 mins                        Waunita Schooner, PT, DPT 01/14/2019, 10:56 AM

## 2019-01-14 NOTE — Progress Notes (Signed)
STROKE TEAM PROGRESS NOTE   INTERVAL HISTORY Patient is sitting up in bed.  He still has left leg weakness though it may be slightly better.  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  Bone marrow biopsy in the past has been negative for hematological malignancy as cause of his pancytopenia  Vitals:   01/14/19 0012 01/14/19 0351 01/14/19 0821 01/14/19 1131  BP: (!) 161/90 (!) 158/70 (!) 151/81 (!) 164/94  Pulse: 75 68 62 72  Resp: 18 17 18 20  Temp: 97.8 F (36.6 C) 98 F (36.7 C) 97.8 F (36.6 C) 97.7 F (36.5 C)  TempSrc: Oral Oral Oral Oral  SpO2: 99% 100% 100% 100%  Weight:      Height:        CBC:  Recent Labs  Lab 01/12/19 0928 01/12/19 1622 01/13/19 0303  WBC 3.5* 3.6* 3.4*  NEUTROABS 2.8  --   --   HGB 8.8* 8.1* 7.9*  HCT 24.3* 22.6* 21.9*  MCV 102.5* 101.8* 100.5*  PLT 115* 114* 109*    Basic Metabolic Panel:  Recent Labs  Lab 01/12/19 0928 01/12/19 1622 01/13/19 0303  NA 139  --  141  K 3.6  --  3.9  CL 107  --  111  CO2 22  --  20*  GLUCOSE 119*  --  125*  BUN 74*  --  71*  CREATININE 2.95* 2.93* 2.95*  CALCIUM 9.1  --  9.1   Lipid Panel:     Component Value Date/Time   CHOL 193 01/13/2019 0303   TRIG 197 (H) 01/13/2019 0303   HDL 32 (L) 01/13/2019 0303   CHOLHDL 6.0 01/13/2019 0303   VLDL 39 01/13/2019 0303   LDLCALC 122 (H) 01/13/2019 0303   HgbA1c:  Lab Results  Component Value Date   HGBA1C 5.4 01/12/2019   Urine Drug Screen:     Component Value Date/Time   LABOPIA NONE DETECTED 01/13/2019 1102   COCAINSCRNUR NONE DETECTED 01/13/2019 1102   LABBENZ NONE DETECTED 01/13/2019 1102   AMPHETMU NONE DETECTED 01/13/2019 1102   THCU NONE DETECTED 01/13/2019 1102   LABBARB NONE DETECTED 01/13/2019 1102    Alcohol Level No results found for: ETH  IMAGING   PHYSICAL EXAM Pleasant middle-aged Caucasian male not in distress. . Afebrile. Head is  nontraumatic. Neck is supple without bruit.    Cardiac exam no murmur or gallop. Lungs are clear to auscultation. Distal pulses are well felt.  ASSESSMENT/PLAN Jeremy Grant is a 58 y.o. male with history of nonischemic cardiomyopathy, hypertension, combination of systolic and diastolic cardiac dysfunction, congestive heart failure and arthritis presenting with L leg weakness.  Neurological Exam :  Awake alert oriented to time place and person.  Normal speech and language without dysarthria or aphasia.  Extraocular movements are full range without nystagmus.  Blinks to threat bilaterally.  Mild left lower facial weakness.  Tongue midline.  Motor system exam reveals no upper extremity drift.  Mild left lower extremity drift.  Normal strength in both upper and right lower extremities.  Diminished fine finger movements on the left.  Orbits right over left upper extremity.  Mild 4/5 weakness of left hip flexors knee extensors and ankle dorsiflexors.  Sensation is preserved bilaterally.  Deep tendon reflexes are symmetric.  Plantars are downgoing. 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  Subacute and chronic left frontal MCA infarcts also of   unknown embolic source-relationship with ANCA vasculitis and glomerulonephritis unclear he also had recent shingles but I doubt this is shingles vasculitis  MRI  Patchy R corpus callosum infarct. Possible watershed. Small vessel disease.   MRA  R ACA stenosis in pericallosal region (?emboli)  Carotid Doppler  B ICA 1-39% stenosis, VAs antegrade   2D Echo EF 55%. No source of embolus   Given pancytopenia, may not be a good AC candidate. so will not consider TEE/loop now. Check TCD bubble study and 30d cardiac monitor at d/c. - pending   LDL 122  HgbA1c 5.4  Lovenox 30 mg sq daily for VTE prophylaxis  No antithrombotic prior to admission, now on aspirin 325 mg daily and clopidogrel 75 mg daily following plavix load.  Continue DAPT for 3 weeks then aspirin alone    Therapy recommendations:  pending   Disposition:  pending   Hypertension  Stable . Permissive hypertension (OK if < 220/120) but gradually normalize in 5-7 days . Long-term BP goal normotensive  Hyperlipidemia  Home meds:  No statin  Now on lipitor 80  LDL 122, goal < 70  Continue statin at discharge  Lumbar Spinal disease  MR LS grad 1 anterolisthesis of L4 on L5. spondyosis at L4-5 w/ central disc protruction on L5 nerve roots and effacement thecal sac. Mod B neural foraminal narrowing and mild central canal stenosis. Also disc protrusion at L5-S1 contacting the R S1 nerve root.  Other Stroke Risk Factors  ETOH use, alcohol level No results found, advised to drink no more than 2 drink(s) a day  Coronary artery disease  Systolic and diastolic Congestive heart failure  Nonischemic cardiomyopathy   Other Active Problems  Glomerulonephritis    Pancytopenia  Shingles in May w/ post herpetic neuralgia, now resolved  Hospital day # 2  .  Patient has presented with acute embolic right anterior cerebral artery territory infarct as well as MRI shows evidence of previous small subacute left frontal and chronic infarcts as well likely of embolic etiology.  Patient did have recent shingles and shingles vasculitis is also consideration but clearly the left sided subacute and chronic infarcts predated his onset of shingles hence this is unlikely to be the cause.  Similar relationship between ANCA vasculitis and renal failure and his stroke is also unclear and intracranial MRA does not show vasculitic appearance.  Patient has chronic pancytopenia with low platelet count and anemia he may not be a good long-term anticoagulation candidate and hence we will defer TEE and loop recorder.  Recommend antiplatelet therapy for now.  May consider doing 30-day outpatient heart monitor.  Continue physical occupational therapy and aggressive risk  factor modification.  Discussed with patient and his wife and answered questions.  Greater than 50% time during this 25-minute visit was spent on counseling and coordination of care about his embolic strokes and discussion about evaluation treatment and answering questions.  Stroke team will sign off.  Follow-up as an outpatient with stroke clinic in 6 weeks.  Antony Contras, MD Medical Director [place] Pager: [phone] 01/14/2019 3:15 PM   To contact Stroke Continuity provider, please refer to [email or web address] After hours, contact General Neurology

## 2019-01-14 NOTE — Progress Notes (Signed)
Inpatient Rehab Admissions:  Inpatient Rehab Consult received.  I met with patient and his wife at the bedside for rehabilitation assessment and to discuss goals and expectations of an inpatient rehab admission.  Both are hopeful for CIR admission, pending insurance authorization and bed availability. Will open insurance for possible admission tomorrow pending approval.   Signed: Shann Medal, PT, DPT Admissions Coordinator [phone] 01/14/19  4:56 PM

## 2019-01-14 NOTE — Progress Notes (Addendum)
Subjective: Patient resting in bed on exam. Reports good therapy session with PT. Patient rested in chair for 3 hours after and says he couldn't move left leg after sitting in chair for an hour. Patient says wife and nurse helped him to his bed and he had been there for 30 min before exam. Initially couldn't move his left leg on physical exam, but suddenly starting moving his leg. Denies any saddle anaesthesia, pain, or other neurological deficits.   Objective:  Vital signs in last 24 hours: Vitals:   01/13/19 1614 01/13/19 2027 01/14/19 0012 01/14/19 0351  BP: (!) 170/91 (!) 177/82 (!) 161/90 (!) 158/70  Pulse: 70 70 75 68  Resp: '15 19 18 17  '$ Temp: 98 F (36.7 C) 98.4 F (36.9 C) 97.8 F (36.6 C) 98 F (36.7 C)  TempSrc: Oral Oral Oral Oral  SpO2: 100% 100% 99% 100%  Weight:      Height:       Physical Exam Constitutional:      Appearance: Normal appearance.  Cardiovascular:     Rate and Rhythm: Normal rate and regular rhythm.     Pulses: Normal pulses.     Heart sounds: Normal heart sounds.  Pulmonary:     Effort: Pulmonary effort is normal.     Breath sounds: Normal breath sounds.  Neurological:     Mental Status: He is alert and oriented to person, place, and time.     Comments: Mental Status: Patient is awake, alert, oriented x3 No signs of aphasia or neglect Cranial Nerves: II: Pupils equal, round, and reactive to light.  III,IV, VI: EOMI without ptosis or diploplia.  V: Facial sensation is symmetric tolight touch andTemperature. VII: Facial movement is symmetric.  VIII: hearing is intact to voice X: Uvula elevates symmetrically XI: Shoulder shrug is symmetric. XII: tongue is midline without atrophy or fasciculations.  Motor: 5/5 bilateral UE, 5/5 Right lower extremitiy, 4/5 Left Lower extremity Sensory: Sensation is grossly intact to light touch bilateral UEs & LEs Deep Tendon Reflexes are symmetrical  Cerebellar: Finger-Nose and Heel-Shin intact bilalat    Psychiatric:        Mood and Affect: Mood normal.        Behavior: Behavior normal.     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   CVA: Presented with 3 days of progressive LLE weakness, no other deficits noted. Multiple areas of infarct including R.Corpus callosum infarct, subacute and chronic left frontal MCA infarcts. R ACA stenosis in pericallosal region. LDL 122 , Hgb A1C 5.4. No PFO on bubble study. No embolic source seen on TTE, EF 55%. Patient had episode of not being able to move his left leg as described above. Resolved during exam. No other deficits found on physical exam. Patient likely tired after therapy session in setting of baseline LLE weakness.  - Appreciate Neurology Recommendarions - Permissive HTN (systolic <810 and diastolic <175) - ASA 102 mg / 81 mg daily  - Atorvastatin '80mg'$  Daily  -Tele monitoring  - PT/OT/SLP  Glomerulonephritis: Patient admitted in March for AKI. 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 He did required temporary HD, but came off of this and had his line removed about 1 months ago. Cr appears stable. On  Cyclophosphamide and Prednisone. Nephrology decreasing cytoxan dose to help with leukopenia. - Appreciate nephrology recommendations  - Continue Cytoxan '75mg'$    HTN: Permissive HTN (systolic <840 and diastolic <698) for 5-7 days and gradually normalize - BP 614-830 systolic in last 73HQN  - Holding Coreg, Amlodipine, Doxazosin for permissive HTN  Pancytopenia: Underwent evaluation in March after presenting with leukopenia and anemia to Hgb ~5. He responded to transfusion appropriately. Heme/Onc was consulted and bone marrow biopsy was performed, which showed  non-specifc, mildly hypercellular marrow with trilineage hematopoiesis and polytypic plasmacytosis. No evidence of multiple myeloma or other concerning findings and is following up outpatient.   Lumbar spine disease: Noted on MRI during weakness workup. Lumbar spondylosis (worst at L4-L5, disc protrusion contacting the L5 nerve roots), L5-S1 disc protrusion contacts the right S1 nerve root.    Diet: Heart , Thin  VTE ppx: Lovenox Code Status:Full   Dispo: Anticipated discharge in approximately 3-4  day(s).   Tamsen Snider, MD PGY1  [phone]

## 2019-01-15 ENCOUNTER — Inpatient Hospital Stay (HOSPITAL_COMMUNITY)
Admission: RE | Admit: 2019-01-15 | Discharge: 2019-02-04 | DRG: 057 | Disposition: A | Discharge status: Home / Self Care | Payer: BC Managed Care – PPO | Source: Intra-hospital | Attending: Physical Medicine & Rehabilitation | Admitting: Physical Medicine & Rehabilitation

## 2019-01-15 ENCOUNTER — Encounter (HOSPITAL_COMMUNITY): Payer: Self-pay

## 2019-01-15 DIAGNOSIS — N184 Chronic kidney disease, stage 4 (severe): Secondary | ICD-10-CM | POA: Diagnosis present

## 2019-01-15 DIAGNOSIS — N059 Unspecified nephritic syndrome with unspecified morphologic changes: Secondary | ICD-10-CM | POA: Diagnosis present

## 2019-01-15 DIAGNOSIS — I491 Atrial premature depolarization: Secondary | ICD-10-CM | POA: Diagnosis not present

## 2019-01-15 DIAGNOSIS — R569 Unspecified convulsions: Secondary | ICD-10-CM | POA: Diagnosis not present

## 2019-01-15 DIAGNOSIS — I5042 Chronic combined systolic (congestive) and diastolic (congestive) heart failure: Secondary | ICD-10-CM | POA: Diagnosis present

## 2019-01-15 DIAGNOSIS — G4700 Insomnia, unspecified: Secondary | ICD-10-CM | POA: Diagnosis present

## 2019-01-15 DIAGNOSIS — D62 Acute posthemorrhagic anemia: Secondary | ICD-10-CM | POA: Diagnosis present

## 2019-01-15 DIAGNOSIS — I6529 Occlusion and stenosis of unspecified carotid artery: Secondary | ICD-10-CM | POA: Diagnosis present

## 2019-01-15 DIAGNOSIS — I776 Arteritis, unspecified: Secondary | ICD-10-CM | POA: Diagnosis present

## 2019-01-15 DIAGNOSIS — D61818 Other pancytopenia: Secondary | ICD-10-CM | POA: Diagnosis present

## 2019-01-15 DIAGNOSIS — T380X5A Adverse effect of glucocorticoids and synthetic analogues, initial encounter: Secondary | ICD-10-CM | POA: Diagnosis not present

## 2019-01-15 DIAGNOSIS — Z7902 Long term (current) use of antithrombotics/antiplatelets: Secondary | ICD-10-CM

## 2019-01-15 DIAGNOSIS — I639 Cerebral infarction, unspecified: Secondary | ICD-10-CM | POA: Diagnosis present

## 2019-01-15 DIAGNOSIS — K2210 Ulcer of esophagus without bleeding: Secondary | ICD-10-CM | POA: Diagnosis not present

## 2019-01-15 DIAGNOSIS — R197 Diarrhea, unspecified: Secondary | ICD-10-CM | POA: Diagnosis not present

## 2019-01-15 DIAGNOSIS — Z7982 Long term (current) use of aspirin: Secondary | ICD-10-CM

## 2019-01-15 DIAGNOSIS — Y92239 Unspecified place in hospital as the place of occurrence of the external cause: Secondary | ICD-10-CM | POA: Diagnosis not present

## 2019-01-15 DIAGNOSIS — M1990 Unspecified osteoarthritis, unspecified site: Secondary | ICD-10-CM | POA: Diagnosis present

## 2019-01-15 DIAGNOSIS — I428 Other cardiomyopathies: Secondary | ICD-10-CM | POA: Diagnosis present

## 2019-01-15 DIAGNOSIS — M62838 Other muscle spasm: Secondary | ICD-10-CM | POA: Diagnosis not present

## 2019-01-15 DIAGNOSIS — I7782 Antineutrophilic cytoplasmic antibody (ANCA) vasculitis: Secondary | ICD-10-CM

## 2019-01-15 DIAGNOSIS — I6300 Cerebral infarction due to thrombosis of unspecified precerebral artery: Secondary | ICD-10-CM | POA: Diagnosis not present

## 2019-01-15 DIAGNOSIS — E785 Hyperlipidemia, unspecified: Secondary | ICD-10-CM | POA: Diagnosis present

## 2019-01-15 DIAGNOSIS — I63521 Cerebral infarction due to unspecified occlusion or stenosis of right anterior cerebral artery: Secondary | ICD-10-CM | POA: Diagnosis not present

## 2019-01-15 DIAGNOSIS — T451X5A Adverse effect of antineoplastic and immunosuppressive drugs, initial encounter: Secondary | ICD-10-CM | POA: Diagnosis not present

## 2019-01-15 DIAGNOSIS — Z7289 Other problems related to lifestyle: Secondary | ICD-10-CM | POA: Diagnosis not present

## 2019-01-15 DIAGNOSIS — I69354 Hemiplegia and hemiparesis following cerebral infarction affecting left non-dominant side: Secondary | ICD-10-CM | POA: Diagnosis present

## 2019-01-15 DIAGNOSIS — K5900 Constipation, unspecified: Secondary | ICD-10-CM | POA: Diagnosis present

## 2019-01-15 DIAGNOSIS — Z888 Allergy status to other drugs, medicaments and biological substances status: Secondary | ICD-10-CM

## 2019-01-15 DIAGNOSIS — I69398 Other sequelae of cerebral infarction: Secondary | ICD-10-CM

## 2019-01-15 DIAGNOSIS — Z8249 Family history of ischemic heart disease and other diseases of the circulatory system: Secondary | ICD-10-CM | POA: Diagnosis not present

## 2019-01-15 DIAGNOSIS — G8110 Spastic hemiplegia affecting unspecified side: Secondary | ICD-10-CM

## 2019-01-15 DIAGNOSIS — R739 Hyperglycemia, unspecified: Secondary | ICD-10-CM

## 2019-01-15 DIAGNOSIS — D509 Iron deficiency anemia, unspecified: Secondary | ICD-10-CM | POA: Diagnosis present

## 2019-01-15 DIAGNOSIS — Z7141 Alcohol abuse counseling and surveillance of alcoholic: Secondary | ICD-10-CM

## 2019-01-15 DIAGNOSIS — Z7952 Long term (current) use of systemic steroids: Secondary | ICD-10-CM | POA: Diagnosis not present

## 2019-01-15 DIAGNOSIS — G8384 Todd's paralysis (postepileptic): Secondary | ICD-10-CM | POA: Diagnosis not present

## 2019-01-15 DIAGNOSIS — I6389 Other cerebral infarction: Secondary | ICD-10-CM | POA: Diagnosis not present

## 2019-01-15 DIAGNOSIS — I130 Hypertensive heart and chronic kidney disease with heart failure and stage 1 through stage 4 chronic kidney disease, or unspecified chronic kidney disease: Secondary | ICD-10-CM | POA: Diagnosis present

## 2019-01-15 DIAGNOSIS — D696 Thrombocytopenia, unspecified: Secondary | ICD-10-CM

## 2019-01-15 DIAGNOSIS — I10 Essential (primary) hypertension: Secondary | ICD-10-CM | POA: Diagnosis present

## 2019-01-15 DIAGNOSIS — Z79899 Other long term (current) drug therapy: Secondary | ICD-10-CM

## 2019-01-15 DIAGNOSIS — D631 Anemia in chronic kidney disease: Secondary | ICD-10-CM | POA: Diagnosis present

## 2019-01-15 DIAGNOSIS — G8314 Monoplegia of lower limb affecting left nondominant side: Secondary | ICD-10-CM

## 2019-01-15 DIAGNOSIS — D649 Anemia, unspecified: Secondary | ICD-10-CM

## 2019-01-15 DIAGNOSIS — K2970 Gastritis, unspecified, without bleeding: Secondary | ICD-10-CM | POA: Diagnosis not present

## 2019-01-15 DIAGNOSIS — R195 Other fecal abnormalities: Secondary | ICD-10-CM | POA: Diagnosis present

## 2019-01-15 DIAGNOSIS — K319 Disease of stomach and duodenum, unspecified: Secondary | ICD-10-CM | POA: Diagnosis not present

## 2019-01-15 LAB — CBC
HCT: 21.6 % — ABNORMAL LOW (ref 39.0–52.0)
Hemoglobin: 7.7 g/dL — ABNORMAL LOW (ref 13.0–17.0)
MCH: 36 pg — ABNORMAL HIGH (ref 26.0–34.0)
MCHC: 35.6 g/dL (ref 30.0–36.0)
MCV: 100.9 fL — ABNORMAL HIGH (ref 80.0–100.0)
Platelets: 112 10*3/uL — ABNORMAL LOW (ref 150–400)
RBC: 2.14 MIL/uL — ABNORMAL LOW (ref 4.22–5.81)
RDW: 13.3 % (ref 11.5–15.5)
WBC: 3.8 10*3/uL — ABNORMAL LOW (ref 4.0–10.5)
nRBC: 0 % (ref 0.0–0.2)

## 2019-01-15 LAB — CREATININE, SERUM
Creatinine, Ser: 3.17 mg/dL — ABNORMAL HIGH (ref 0.61–1.24)
GFR calc Af Amer: 24 mL/min — ABNORMAL LOW (ref >60)
GFR calc non Af Amer: 20 mL/min — ABNORMAL LOW (ref >60)

## 2019-01-15 MED ORDER — PREDNISONE 10 MG PO TABS
5.0000 mg | ORAL_TABLET | Freq: Every day | ORAL | Status: DC
  Administered 2019-01-16 – 2019-02-04 (×20): 5 mg via ORAL
  Filled 2019-01-15 (×20): qty 1

## 2019-01-15 MED ORDER — CLOPIDOGREL BISULFATE 75 MG PO TABS
75.0000 mg | ORAL_TABLET | Freq: Every day | ORAL | Status: DC
  Administered 2019-01-16 – 2019-01-28 (×13): 75 mg via ORAL
  Filled 2019-01-15 (×13): qty 1

## 2019-01-15 MED ORDER — ENOXAPARIN SODIUM 30 MG/0.3ML ~~LOC~~ SOLN
30.0000 mg | SUBCUTANEOUS | Status: DC
  Administered 2019-01-16 – 2019-01-24 (×9): 30 mg via SUBCUTANEOUS
  Filled 2019-01-15 (×9): qty 0.3

## 2019-01-15 MED ORDER — PREDNISONE 5 MG PO TABS: 5.0000 mg | ORAL_TABLET | Freq: Every day | ORAL | 0 refills | Status: DC

## 2019-01-15 MED ORDER — ACETAMINOPHEN 325 MG PO TABS
650.0000 mg | ORAL_TABLET | Freq: Four times a day (QID) | ORAL | Status: DC | PRN
  Administered 2019-01-16 – 2019-01-30 (×14): 650 mg via ORAL
  Filled 2019-01-15 (×15): qty 2

## 2019-01-15 MED ORDER — CYCLOPHOSPHAMIDE 25 MG PO CAPS
75.0000 mg | ORAL_CAPSULE | Freq: Every day | ORAL | Status: DC
  Administered 2019-01-16 – 2019-01-28 (×12): 75 mg via ORAL
  Filled 2019-01-15 (×15): qty 3

## 2019-01-15 MED ORDER — SORBITOL 70 % SOLN
30.0000 mL | Freq: Every day | Status: DC | PRN
  Filled 2019-01-15: qty 30

## 2019-01-15 MED ORDER — ATORVASTATIN CALCIUM 80 MG PO TABS
80.0000 mg | ORAL_TABLET | Freq: Every day | ORAL | Status: DC
  Administered 2019-01-16 – 2019-02-03 (×19): 80 mg via ORAL
  Filled 2019-01-15 (×19): qty 1

## 2019-01-15 MED ORDER — CYCLOPHOSPHAMIDE 25 MG PO CAPS: 70.0000 mg | ORAL_CAPSULE | Freq: Every day | ORAL | 0 refills | Status: DC

## 2019-01-15 MED ORDER — ATORVASTATIN CALCIUM 80 MG PO TABS: 80.0000 mg | ORAL_TABLET | Freq: Every day | ORAL | 0 refills | Status: DC

## 2019-01-15 MED ORDER — CLOPIDOGREL BISULFATE 75 MG PO TABS: 75.0000 mg | ORAL_TABLET | Freq: Every day | ORAL | 0 refills | Status: DC

## 2019-01-15 MED ORDER — ASPIRIN 325 MG PO TABS
325.0000 mg | ORAL_TABLET | Freq: Every day | ORAL | Status: DC
  Administered 2019-01-16 – 2019-01-28 (×13): 325 mg via ORAL
  Filled 2019-01-15 (×13): qty 1

## 2019-01-15 MED ORDER — ENOXAPARIN SODIUM 40 MG/0.4ML ~~LOC~~ SOLN: 40.0000 mg | SUBCUTANEOUS | Status: DC

## 2019-01-15 MED ORDER — ASPIRIN 325 MG PO TABS: 325.0000 mg | ORAL_TABLET | Freq: Every day | ORAL | 0 refills | Status: DC

## 2019-01-15 NOTE — Discharge Instructions (Signed)
[name] ,  It was our pleasure taking part in your care.  You came in with 3 days of progressive weakness, most noticeable in your left leg. You were found to have areas of new stroke and old stroke. For the stroke you were started on Asprin and Clopidogrel. You will stop Clopidogrel after 3 weeks. Also started on atorvastatin 80 mg.     For your Kidney disease we consulted nephrology. Nephrology recommended a lower dose of Cytoxan. Continue the Cytoxan and prednisone until follow up with nephrology.  You will be transferred to inpatient rehab from our care. Please follow up with your Nephrologist at [facility] and with Neurology. In addition please make appointment with PCP after discharge.   My best,  Dr.Bretta Fees

## 2019-01-15 NOTE — Plan of Care (Signed)
  Problem: RH BOWEL ELIMINATION Goal: RH STG MANAGE BOWEL WITH ASSISTANCE Description: STG Manage Bowel with Assistance. Outcome: Progressing   Problem: RH BLADDER ELIMINATION Goal: RH STG MANAGE BLADDER WITH ASSISTANCE Description: STG Manage Bladder With Assistance Outcome: Progressing   Problem: RH SKIN INTEGRITY Goal: RH STG SKIN FREE OF INFECTION/BREAKDOWN Outcome: Progressing   Problem: RH SAFETY Goal: RH STG ADHERE TO SAFETY PRECAUTIONS W/ASSISTANCE/DEVICE Description: STG Adhere to Safety Precautions With Assistance/Device. Outcome: Progressing   Problem: RH PAIN MANAGEMENT Goal: RH STG PAIN MANAGED AT OR BELOW PT'S PAIN GOAL Outcome: Progressing

## 2019-01-15 NOTE — Progress Notes (Signed)
   Subjective: Patient resting in bed on exam. He has been accepted at [facility].   Objective:  Vital signs in last 24 hours: Vitals:   01/15/19 0459 01/15/19 0803 01/15/19 1129 01/15/19 1545  BP:  (!) 174/77 (!) 182/92 (!) 173/96  Pulse:  74 68 67  Resp:  '20 16 20  '$ Temp:  98.9 F (37.2 C) 97.9 F (36.6 C) 98 F (36.7 C)  TempSrc:  Oral Oral Oral  SpO2:  100% 100% 100%  Weight: 63.7 kg     Height:       Physical Exam Constitutional:      Appearance: Normal appearance.  Cardiovascular:     Rate and Rhythm: Normal rate and regular rhythm.     Pulses: Normal pulses.     Heart sounds: Normal heart sounds.  Pulmonary:     Effort: Pulmonary effort is normal.     Breath sounds: Normal breath sounds.  Neurological:     Mental Status: He is alert and oriented to person, place, and time. Mental status is at baseline.     Comments:  Baseline deficits: Left lower extremity weakness 4/5, mild weakness noticeable in left hand.   Psychiatric:        Mood and Affect: Mood normal.        Behavior: Behavior normal.     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   CVA: Presented with 3 days of progressive LLE weakness, no other deficits noted. Multiple areas of infarct including R.Corpus callosum infarct, subacute and chronic left frontal MCA infarcts. R ACA stenosis in pericallosal region. LDL 122 , Hgb A1C 5.4. No PFO on bubble study. No embolic source seen on TTE, EF 55%. - Permissive HTN (systolic <233 and diastolic <612) for 5-7 days. 9/5 start to slowly normalize with medication - ASA 325 mg - Atorvastatin '80mg'$  Daily  -Tele monitoring  - dc to [facility]  Glomerulonephritis: Patient admitted in March for AKI. Biopsy showed vasculitis, which was Anti-Histone and MPO  positive. Drug-induced (Hydralazine) suspected. He did required temporary HD, but came off of this and had his line removed about 1 months ago. Cr appears stable. On Cyclophosphamide and Prednisone. Nephrology decreasing cytoxan dose to help with leukopenia. - Continue Cytoxan '75mg'$   HTN: Permissive HTN (systolic <244 and diastolic <975) for 5-7 days and gradually normalize - BP 300-511 systolic in last 02TRZ  - Holding Coreg, Amlodipine, Doxazosin for permissive HTN - start to add medication back on 9/5  Pancytopenia: Underwent evaluation in March after presenting with leukopenia and anemia to Hgb ~5. He responded to transfusion appropriately. Heme/Onc was consulted and bone marrow biopsy was performed, which showed non-specifc, mildly hypercellular marrow with trilineage hematopoiesis and polytypic plasmacytosis. No evidence of multiple myeloma or other concerning findings and is following up outpatient.   Lumbar spine disease: Noted on MRI during weakness workup. Lumbar spondylosis (worst at L4-L5, disc protrusion contacting the L5 nerve roots), L5-S1 disc protrusion contacts the right S1 nerve root.   Diet: Heart , Thin  VTE ppx: Lovenox Code Status:Full   Dispo: Anticipated discharge today to [place], MD PGY1  [phone]

## 2019-01-15 NOTE — Discharge Summary (Signed)
Name: Jeremy Grant MRN: [id number] DOB: 07/14/1960 58 y.o. PCP: Hayden Rasmussen, MD  Date of Admission: 01/12/2019  8:19 AM Date of Discharge: 01/15/2019 Attending Physician: Dr. Gilles Chiquito Discharge Diagnosis: 1. CVA  Discharge Medications: Allergies as of 01/15/2019      Reactions   Hydralazine Hcl       Medication List    STOP taking these medications   amLODipine 10 MG tablet Commonly known as: NORVASC   carvedilol 12.5 MG tablet Commonly known as: COREG   doxazosin 4 MG tablet Commonly known as: CARDURA   multivitamin Tabs tablet     TAKE these medications   aspirin 325 MG tablet Take 1 tablet (325 mg total) by mouth daily. Start taking on: January 16, 2019   atorvastatin 80 MG tablet Commonly known as: LIPITOR Take 1 tablet (80 mg total) by mouth daily at 6 PM.   clopidogrel 75 MG tablet Commonly known as: PLAVIX Take 1 tablet (75 mg total) by mouth daily for 18 days. Start taking on: January 16, 2019   cyclophosphamide 25 MG capsule Commonly known as: CYTOXAN Take 3 capsules (75 mg total) by mouth daily with lunch for 10 days. Take with food to minimize GI upset. Take early in the day and maintain hydration. Start taking on: January 16, 2019 What changed:   medication strength  how much to take  when to take this  additional instructions   predniSONE 5 MG tablet Commonly known as: DELTASONE Take 1 tablet (5 mg total) by mouth daily with breakfast. Start taking on: January 16, 2019 What changed:   medication strength  how much to take       Disposition and follow-up:   Mr.Jeremy Grant was discharged from [facility] in Stable condition.  At the hospital follow up visit please address:  1.   #CVA     - continue high intensity statin, Asprin. Plavix should be stopped after 3 weeks from 9/1.  #HTN   -permissive HTN for 5-7 days post stroke. Held Coreg, amlodipine, and doxazosin. Gradually lower BP  starting on 9/5.  #Glomerulonephritis  - Cr stable on admission. Cytoxan dose reduce from 100 to '75mg'$  to possibly improve leukopenia. Also on '5mg'$  prednisone .   2.  Labs / imaging needed at time of follow-up: CBC, BMP  3.  Pending labs/ test needing follow-up: N/A  Follow-up Appointments: Follow-up Information    [facility] Follow up in 4 week(s).   Specialty: Neurology Why: stroke clinic. office will call with appt date and time.  Contact information: [address] [place] [facility] Course by problem list:  [name] with 3 days of progressive LLE weakness, no other deficits noted. Multiple areas of infarct including R.Corpus callosum infarct, subacute and chronic left frontal MCA infarcts. R ACA stenosis in pericallosal region. LDL 122 , Hgb A1C 5.4. No PFO on bubble study. No embolic source seen on TTE, EF 55%.Marland Kitchen Discharged to [facility].  -Permissive HTN (systolic <725 and diastolic <366) for 5-7 days. 9/5 start to slowly normalize with medication - ASA 325 mg -Atorvastatin'80mg'$  Daily   Glomerulonephritis: Patient admitted in March for AKI. 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 He did required temporary HD, but came off of this and had his line removed about 1 months ago. Crappearsstable. On Cyclophosphamide and Prednisone. Nephrology decreasing cytoxan dose to help  with leukopenia on this admission.   HTN: Permissive HTN (systolic <973 and diastolic <532) for 5-7 days and gradually normalize - BP 992-426 systolic in last 83MHD  - Holding Coreg, Amlodipine, Doxazosin for permissive HTN - start to add medication back on 9/5  Pancytopenia: Underwent evaluation in March after presenting with leukopenia and anemia to Hgb ~5. He responded to transfusion appropriately. Heme/Onc was consulted and bone marrow biopsy was performed,  which showed non-specifc,mildly hypercellular marrow with trilineage hematopoiesis and polytypic plasmacytosis. Noevidence of multiple myeloma orotherconcerningfindings and is following up outpatient.   Lumbar spine disease: Noted on MRI during weakness workup.Lumbar spondylosis(worst atL4-L5,disc protrusion contacting the L5 nerve roots),L5-S1 disc protrusion contacts the right S1 nerve root.   Discharge Vitals:   BP (!) 173/96 (BP Location: Left Arm)    Pulse 67    Temp 98 F (36.7 C) (Oral)    Resp 20    Ht '6\' 1"'$  (1.854 m)    Wt 63.7 kg    SpO2 100%    BMI 18.53 kg/m   Pertinent Labs, Studies, and Procedures:  CBC Latest Ref Rng & Units 01/15/2019 01/13/2019 01/12/2019  WBC 4.0 - 10.5 K/uL 3.8(L) 3.4(L) 3.6(L)  Hemoglobin 13.0 - 17.0 g/dL 7.7(L) 7.9(L) 8.1(L)  Hematocrit 39.0 - 52.0 % 21.6(L) 21.9(L) 22.6(L)  Platelets 150 - 400 K/uL 112(L) 109(L) 114(L)   BMP Latest Ref Rng & Units 01/15/2019 01/13/2019 01/12/2019  Glucose 70 - 99 mg/dL - 125(H) -  BUN 6 - 20 mg/dL - 71(H) -  Creatinine 0.61 - 1.24 mg/dL 3.17(H) 2.95(H) 2.93(H)  Sodium 135 - 145 mmol/L - 141 -  Potassium 3.5 - 5.1 mmol/L - 3.9 -  Chloride 98 - 111 mmol/L - 111 -  CO2 22 - 32 mmol/L - 20(L) -  Calcium 8.9 - 10.3 mg/dL - 9.1 -   MR Brain wo contrast  FINDINGS: Brain: Acute infarct in the deep white matter on the right involving the corpus callosum and frontal parietal deep white matter. Numerous small patchy areas of acute infarct on the right  Facilitated diffusion in the left frontal white matter may be due to subacute infarct. Chronic microvascular ischemic changes in the white matter. Small chronic infarct left frontal cortex.  Negative for mass or hydrocephalus. Mild atrophy. Chronic microhemorrhage right lateral cerebellum. No midline shift.  Vascular: Normal arterial flow voids  Skull and upper cervical spine: Negative  Sinuses/Orbits: Mild mucosal edema paranasal sinuses.  Normal  orbit  Other: None  IMPRESSION: Patchy areas of acute infarct in the deep white matter on the right including the corpus callosum. Possible watershed type infarct.  Chronic microvascular ischemic changes in the white matter.  MR lumbar spine wo contrast FINDINGS: Segmentation: There are 5 non-rib bearing lumbar type vertebral bodies with the last intervertebral disc space labeled as L5-S1.  Alignment: There is a minimal anterolisthesis of L4 on L5 measuring 3 mm. A minimal retrolisthesis of L1 on L2 is seen.  Vertebrae: The vertebral body heights are well maintained. No fracture, marrow edema,or pathologic marrow infiltration.  Conus medullaris and cauda equina: Conus extends to the L1 level. Conus and cauda equina appear normal.  Paraspinal and other soft tissues: The paraspinal soft tissues and visualized retroperitoneal structures are unremarkable. The sacroiliac joints are intact.  Disc levels:  T12-L1:  No significant canal or neural foraminal narrowing.  L1-L2: There is a minimal broad-based disc bulge which causes mild left neural foraminal narrowing.  L2-L3: There is a minimal broad-based disc  bulge with ligamentum flavum hypertrophy which causes moderate bilateral neural foraminal narrowing.  L3-L4: There is a broad-based disc bulge with ligamentum flavum hypertrophy which causes mild bilateral neural foraminal narrowing.  L4-L5: There is a broad-based disc bulge with disc uncovering and a central disc protrusion. The disc protrusion contacts the bilateral descending L5 nerve roots. There is facet arthrosis and ligamentum flavum hypertrophy. There is large bulky right-sided osteophyte which causes effacement of the thecal sac and slight displacement to the left. Moderate bilateral neural foraminal narrowing is seen. There is mild central canal narrowing.  L5-S1: There is a broad-based disc bulge with a central disc protrusion and annular  fissure. The disc protrusion contacts the right S1 nerve root. There is moderate bilateral neural foraminal narrowing, left greater than right.  IMPRESSION: 1. Grade 1 anterolisthesis of L4 on L5. 2. Lumbar spine spondylosis most notable at L4-L5 with a central disc protrusion contacting the bilateral descending L5 nerve roots and large bulky right-sided osteophytes which causes effacement of the thecal sac. There is moderate bilateral neural foraminal narrowing and mild central canal stenosis at this level. 3. Also at L5-S1 there is a central disc protrusion with annular fissure and the disc protrusion contacts the right S1 nerve root.   MR Angio Head wo Contrast FINDINGS: Both internal carotid arteries are widely patent through the skull base and siphon regions. The anterior and middle cerebral vessels are patent proximally. No MCA territory abnormality is seen. The left anterior cerebral artery appears normal. There are abnormalities in the right anterior cerebral artery that could be stenoses or emboli. These are seen in the Pericallosal region.  Both vertebral arteries are patent to the basilar. No basilar stenosis. Posterior circulation branch vessels are normal.  IMPRESSION: Abnormal appearance of the right anterior cerebral artery in the pericallosal region most likely representing stenoses secondary to emboli.   Discharge Instructions: Discharge Instructions    Ambulatory referral to Neurology   Complete by: As directed    Follow up in stroke clinic at [facility] with Frann Rider, NP in about 4 weeks. If not available, consider Dr. Antony Contras, Dr. Bess Harvest, or Dr. Sarina Ill.   Diet - low sodium heart healthy   Complete by: As directed    Discharge instructions   Complete by: As directed    Increase activity slowly   Complete by: As directed       Signed: Tamsen Snider, MD PGY1  [phone]

## 2019-01-15 NOTE — Progress Notes (Signed)
Occupational Therapy Treatment Patient Details Name: Jeremy Grant MRN: [id number] DOB: Jul 24, 1960 Today's Date: 01/15/2019    History of present illness Pt is a 58 y/o M admitted on 01/12/2019 for LLE weakness x 3 days & inability to walk. 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 PMH significant for arthritis, CHF, combined systolic & diastolic cardiac dysfunction, HTN, nonischemic cardiomyopathy.   OT comments  Treatment session with focus on dynamic standing balance and functional use of LUE during self-care tasks.  Pt ambulated to sink with RW with up to mod assist for stability of LLE due to hyperextension and instability at ankle during ambulation.  Engaged in grooming tasks in standing with min assist at LLE during standing to facilitate good weight bearing.  Utilized LUE to open containers and weight shift to obtain items on Lt.  Encouraged use of LUE as much as possible to continue to address gross and fine motor movements.  Issued ball for pinch and grip strengthening as well as coordination with ball toss from hand to hand.  Pt will continue to benefit from OT acutely to focus on functional mobility and ADLs with continued intensive therapies at [facility].  Follow Up Recommendations  [facility]    Equipment Recommendations  Tub/shower bench       Precautions / Restrictions Precautions Precautions: Fall Restrictions Weight Bearing Restrictions: No       Mobility Bed Mobility Overal bed mobility: Needs Assistance Bed Mobility: Supine to Sit;Sit to Supine     Supine to sit: Supervision Sit to supine: Min assist   General bed mobility comments: extra time & cuing to initiate supine>sit, initial LOB to Lt when returning to supine in bed  Transfers Overall transfer level: Needs assistance Equipment used: Rolling walker (2 wheeled) Transfers: Sit to/from Stand Sit to Stand: Min assist                  ADL either performed or assessed  with clinical judgement   ADL Overall ADL's : Needs assistance/impaired     Grooming: Minimal assistance;Standing;Wash/dry hands;Wash/dry face;Oral care Grooming Details (indicate cue type and reason): therapist provided blocking at Lt knee to prevent hyperextension in standing Upper Body Bathing: Min guard;Sitting       Upper Body Dressing : Min guard;Sitting       Toilet Transfer: Moderate assistance;Ambulation;RW Toilet Transfer Details (indicate cue type and reason): ambulated to toilet with min-mod assist with RW with therapist providing tactile cues/facilitation for safe positioning of LLE during ambulation         Functional mobility during ADLs: Minimal assistance;Moderate assistance;Rolling walker       Vision Baseline Vision/History: Wears glasses Wears Glasses: At all times Patient Visual Report: No change from baseline            Cognition Arousal/Alertness: Awake/alert Behavior During Therapy: WFL for tasks assessed/performed Overall Cognitive Status: Within Functional Limits for tasks assessed                                                     Pertinent Vitals/ Pain       Pain Assessment: No/denies pain         Frequency  Min 2X/week        Progress Toward Goals  OT Goals(current goals can now be found  in the care plan section)  Progress towards OT goals: Progressing toward goals  Acute Rehab OT Goals Patient Stated Goal: to get better OT Goal Formulation: With patient Time For Goal Achievement: 01/27/19 Potential to Achieve Goals: Good  Plan Discharge plan remains appropriate       AM-PAC OT "6 Clicks" Daily Activity     Outcome Measure   Help from another person eating meals?: None Help from another person taking care of personal grooming?: A Little Help from another person toileting, which includes using toliet, bedpan, or urinal?: A Little Help from another person bathing (including washing, rinsing,  drying)?: A Little Help from another person to put on and taking off regular upper body clothing?: A Little Help from another person to put on and taking off regular lower body clothing?: A Lot 6 Click Score: 18    End of Session Equipment Utilized During Treatment: Gait belt;Rolling walker  OT Visit Diagnosis: Unsteadiness on feet (R26.81);History of falling (Z91.81);Hemiplegia and hemiparesis Hemiplegia - Right/Left: Left Hemiplegia - dominant/non-dominant: Non-Dominant Hemiplegia - caused by: Cerebral infarction   Activity Tolerance Patient tolerated treatment well   Patient Left in bed;with call bell/phone within reach;with bed alarm set;with family/visitor present   Nurse Communication Mobility status        Time: 1674-2552 OT Time Calculation (min): 29 min  Charges: OT General Charges $OT Visit: 1 Visit OT Treatments $Self Care/Home Management : 8-22 mins $Therapeutic Activity: 8-22 mins    [place], [place] 01/15/2019, 1:21 PM

## 2019-01-15 NOTE — PMR Pre-admission (Signed)
PMR Admission Coordinator Pre-Admission Assessment  Patient: Jeremy Grant is an 58 y.o., male MRN: [id number] DOB: 04-Jul-1960 Height: _0  (185.4 cm) Weight: 63.7 kg  Insurance Information HMO:     PPO: yes     PCP:      IPA:      80/20:      OTHER:  PRIMARY: BCBS      Policy#: [id number]      Subscriber: patient CM Name: Jeremy Grant      Phone#:      Fax#:  Pre-Cert#: [id number]  [place] for CIR provided by Jeremy Grant at [facility] with updates due to Janett Labella at fax [phone]    Employer:  Benefits:  Phone #: [phone]     Name:  Eff. Date: 05/14/2018     Deduct: $2,800 (met in full)      Out of Pocket Max: [phone] (met in full)      Life Max:  CIR: 80%      SNF: 80%  Outpatient: 80%     Co-Pay: 20% Home Health: 80%      Co-Pay: 20% DME: 80%     Co-Pay: 20% Providers: preferred network  SECONDARY:       Policy#:       Subscriber:  CM Name:       Phone#:      Fax#: Pre-Cert#:       Employer: Benefits:  Phone #:      Name:  Eff. Date:      Deduct:       Out of Pocket Max:       Life Max:  CIR:       SNF:  Outpatient:      Co-Pay:  Home Health:       Co-Pay:  DME:      Co-Pay:   Medicaid Application Date:       Case Manager:  Disability Application Date:       Case Worker:   The "Data Collection Information Summary" for patients in Inpatient Rehabilitation Facilities with attached "Privacy Act [place] Records" was provided and verbally reviewed with: N/A  Emergency Contact Information Contact Information    Name Relation Home Work Mobile   [place] Spouse [phone]  [phone]   Jeremy Grant Mother [phone]        Current Medical History  Patient Admitting Diagnosis: CVA  History of Present Illness: Jeremy Grant is a 58 year old right-handed male with history of nonischemic cardiomyopathy with right heart catheterization 2015, shingles with postherpetic neuralgia hypertension, combined systolic and diastolic congestive heart failure  with ejection fraction of 20%, alcohol abuse, CKD stage IV with creatinine 2.95 as well as 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  Per chart review lives with spouse.  Independent prior to admission he had been working at [employer] up until March 2020.  1 level home with 3 steps to entry.  Presented 01/12/2019 when his left leg became weak and fell without loss of consciousness.  He initially did not come to the hospital.  Hemoglobin 8.8, creatinine 2.95, urine drug screen negative.  MRI of the brain showed patchy area of acute infarction in the deep white matter on the right including the corpus callosum possible watershed type infarct.  Patient did not receive TPA.  MRI lumbar spine showed grade 1 anterior listhesis of L4 on L5 with mild central cord protrusion.  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  Echocardiogram with ejection fraction of 90% normal systolic function.  Carotid Dopplers to 1-39% ICA stenosis.  Neurology follow-up maintained on aspirin and Plavix for CVA prophylaxis x3 weeks then aspirin alone.  No plan for TEE or loop recorder at this time.  Subcutaneous Lovenox for DVT prophylaxis.  Tolerating a regular diet.  Therapy evaluations completed and patient was recommended for a comprehensive rehab program.  Complete NIHSS TOTAL: 2  Patient's medical record from [facility]  has been reviewed by the rehabilitation admission coordinator and physician.  Past Medical History  Past Medical History:  Diagnosis Date  . Arthritis   . CHF (congestive heart failure) ([place])   . Combined systolic and diastolic cardiac dysfunction    Echo 11/03/2013 EF 24%, grade 3 diastolic dysfunction  . Hypertension   . NICM (nonischemic cardiomyopathy) ([place])     a. L/RHC (11/05/13): RA: 3, RV 52/5, PA 49/19 (31), PCWP 10, AO 166/93, PA 67%, Fick CO/CI: 5.71/2.97, Lmain: normal, LAD: large, without signficant dz, first diagonal has 20% dz at ostium, LCx: normal, RCA: 30% stenosis at the bifurcation of PDA and PLOM    Family History   family history includes Arrhythmia in his father; Heart attack in his father; Heart disease in his father; Hypertension in his brother and brother.  Prior Rehab/Hospitalizations Has the patient had prior rehab or hospitalizations prior to admission? Yes  Has the patient had major surgery during 100 days prior to admission? No   Current Medications  Current Facility-Administered Medications:  .  acetaminophen (TYLENOL) tablet 650 mg, 650 mg, Oral, Q6H PRN, Madalyn Rob, MD, 650 mg at 01/15/19 1434 .  aspirin tablet 325 mg, 325 mg, Oral, Daily, Neva Seat, MD, 325 mg at 01/15/19 0932 .  atorvastatin (LIPITOR) tablet 80 mg, 80 mg, Oral, q1800, Neva Seat, MD, 80 mg at 01/14/19 1708 .  clopidogrel (PLAVIX) tablet 75 mg, 75 mg, Oral, Daily, Greta Doom, MD, 75 mg at 01/15/19 0931 .  cyclophosphamide (CYTOXAN) capsule 75 mg, 75 mg, Oral, Q lunch, Rosita Fire, MD, 75 mg at 01/15/19 1225 .  enoxaparin (LOVENOX) injection 30 mg, 30 mg, Subcutaneous, Q24H, Neva Seat, MD, 30 mg at 01/14/19 1708 .  predniSONE (DELTASONE) tablet 5 mg, 5 mg, Oral, Q breakfast, Neva Seat, MD, 5 mg at 01/15/19 0931 .  sodium chloride flush (NS) 0.9 % injection 3 mL, 3 mL, Intravenous, Q12H, Neva Seat, MD, 3 mL at 01/15/19 0932  Patients Current Diet:  Diet Order            Diet Heart Room service appropriate? Yes; Fluid consistency: Thin  Diet effective now              Precautions / Restrictions Precautions Precautions: Fall Restrictions Weight Bearing Restrictions: No   Has the patient had 2 or more falls or a fall with injury in the past year? Yes  Prior Activity  Level Community (5-7x/wk): was working FT until he had vasculitis in March, has been on leave since that time with multiple medical complications  Prior Functional Level Self Care: Did the patient need help bathing, dressing, using the toilet or eating? Independent  Indoor Mobility: Did the patient need assistance with walking from room to room (with or without device)? Independent  Stairs: Did the patient need assistance with internal or external stairs (with or without device)? Independent  Functional Cognition: Did the patient need help planning regular tasks such as shopping or remembering to take medications? Independent  Home Assistive Devices / Equipment Home Assistive Devices/Equipment: Oxygen, Environmental consultant (specify type) Home Equipment: Walker - 4 wheels, Cane - single point, Hand held shower head  Prior Device Use: Indicate devices/aids used by the patient prior to current illness, exacerbation or injury? None of the above  Current Functional Level Cognition  Overall Cognitive Status: Within Functional Limits for tasks assessed Orientation Level: Oriented X4    Extremity Assessment (includes Sensation/Coordination)  Upper Extremity Assessment: LUE deficits/detail LUE Deficits / Details: decreased motor control in LUE, mild dysmetria with finger to nose LUE Sensation: WNL LUE Coordination: decreased fine motor       ADLs  Overall ADL's : Needs assistance/impaired Eating/Feeding: Set up Grooming: Minimal assistance, Standing, Wash/dry hands, Wash/dry face, Oral care Grooming Details (indicate cue type and reason): therapist provided blocking at Lt knee to prevent hyperextension in standing Upper Body Bathing: Min guard, Sitting Lower Body Bathing: Minimal assistance, Moderate assistance, Sit to/from stand Upper Body Dressing : Min guard, Sitting Lower Body Dressing: Moderate assistance, Sit to/from stand Toilet Transfer: Moderate assistance, Ambulation, RW Toilet Transfer  Details (indicate cue type and reason): ambulated to toilet with min-mod assist with RW with therapist providing tactile cues/facilitation for safe positioning of LLE during ambulation Functional mobility during ADLs: Minimal assistance, Moderate assistance, Rolling walker    Mobility  Overal bed mobility: Needs Assistance Bed Mobility: Supine to Sit, Sit to Supine Supine to sit: Supervision Sit to supine: Min assist General bed mobility comments: extra time & cuing to initiate supine>sit, initial LOB to Lt when returning to supine in bed    Transfers  Overall transfer level: Needs assistance Equipment used: Rolling walker (2 wheeled) Transfers: Sit to/from Stand Sit to Stand: Min assist Stand pivot transfers: Min assist General transfer comment: occasional impaired ability to weight bear through LLE for support during sit>stand, very impaired safety awareness for stand>sit as pt abruptly sits in recliner without ensuring he's close enough or holding to armrests    Ambulation / Gait / Stairs / Wheelchair Mobility  Ambulation/Gait Ambulation/Gait assistance: Herbalist (Feet): 50 Feet Assistive device: Rolling walker (2 wheeled) General Gait Details: decreased LLE hip/knee flexion during swing phase, absent heel strike LLE, LLE genu recurvatum in stance phase with therapist providing tactile cuing to help prevent Gait velocity: decreased    Posture / Balance Dynamic Sitting Balance Sitting balance - Comments: L lateral lean during static sitting balance with cuing to correct Balance Overall balance assessment: Needs assistance Sitting-balance support: Feet supported, Bilateral upper extremity supported Sitting balance-Leahy Scale: Poor Sitting balance - Comments: L lateral lean during static sitting balance with cuing to correct Standing balance-Leahy Scale: Poor Standing balance comment: BUE support on RW during standing/gait    Special needs/care consideration  BiPAP/CPAP no CPM no Continuous Drip IV no Dialysis no        Days n/a Life Vest no Oxygen no Special Bed no Trach Size no Wound Vac (area) no      Location n/a Skin ecchymosis to BUEs                          Bowel mgmt: continent, last BM 9/2 Bladder mgmt: incontinent Diabetic mgmt: no Behavioral consideration no Chemo/radiation no   Previous Home Environment (from acute therapy documentation) Living Arrangements: Spouse/significant other Available Help at Discharge: Available PRN/intermittently, Family Type  of Home: House Home Layout: One level Home Access: Stairs to enter Entrance Stairs-Rails: Can reach both, Left, Right Entrance Stairs-Number of Steps: 3+4 Bathroom Shower/Tub: Optometrist: Yes How Accessible: Accessible via wheelchair [place]: No  Discharge Living Setting Plans for Discharge Living Setting: Patient's home Type of Home at Discharge: House Discharge Home Layout: One level Discharge Home Access: Stairs to enter Entrance Stairs-Rails: Can reach both Entrance Stairs-Number of Steps: 3+1 Discharge Bathroom Shower/Tub: Tub/shower unit Discharge Bathroom Toilet: Standard Discharge Bathroom Accessibility: Yes How Accessible: Accessible via walker Does the patient have any problems obtaining your medications?: No  Social/Family/Support Systems Anticipated Caregiver: spouse, [name] Anticipated Ambulance person Information: [phone] Caregiver Availability: 24/7 Discharge Plan Discussed with Primary Caregiver: Yes Is Caregiver In Agreement with Plan?: Yes Does Caregiver/Family have Issues with Lodging/Transportation while Pt is in Rehab?: No  Goals/Additional Needs Patient/Family Goal for Rehab: PT/OT mod I Expected length of stay: 7-10 days Dietary Needs: heart health/thin Pt/Family Agrees to Admission and willing to participate: Yes Program Orientation Provided & Reviewed with  Pt/Caregiver Including Roles  & Responsibilities: Yes  Decrease burden of Care through IP rehab admission: n/a  Possible need for SNF placement upon discharge: No. Wife and pt aware that SNF is not an option following rehab and are prepared to provide 24/7 if needed at discharge  Patient Condition: I have reviewed medical records from [facility], spoken with CM, and patient and spouse. I met with patient at the bedside for inpatient rehabilitation assessment.  Patient will benefit from ongoing PT and OT, can actively participate in 3 hours of therapy a day 5 days of the week, and can make measurable gains during the admission.  Patient will also benefit from the coordinated team approach during an Inpatient Acute Rehabilitation admission.  The patient will receive intensive therapy as well as Rehabilitation physician, nursing, social worker, and care management interventions.  Due to bladder management, safety, skin/wound care, disease management, medication administration, pain management and patient education the patient requires 24 hour a day rehabilitation nursing.  The patient is currently min to mod assist  with mobility and basic ADLs.  Discharge setting and therapy post discharge at home with home health is anticipated.  Patient has agreed to participate in the Acute Inpatient Rehabilitation Program and will admit today.  Preadmission Screen Completed By:  Michel Santee, 01/15/2019 3:31 PM ______________________________________________________________________   Discussed status with Dr. Dagoberto Ligas on 01/15/19  at 3:35 PM  and received approval for admission today.  Admission Coordinator:  Michel Santee, PT, time 3:35 PM Sudie Grumbling 01/15/19    Assessment/Plan: Diagnosis: 1. Does the need for close, 24 hr/day Medical supervision in concert with the patient's rehab needs make it unreasonable for this patient to be served in a less intensive setting? Yes 2. Co-Morbidities requiring  supervision/potential complications: ESRD/CKD stage IV, HTN, CHF with EF of 20%,  3. Due to bladder management, safety, skin/wound care, medication administration and patient education, does the patient require 24 hr/day rehab nursing? Yes 4. Does the patient require coordinated care of a physician, rehab nurse, PT (1-2 hrs/day, 5 days/week) and OT (1-2 hrs/day, 5 days/week) to address physical and functional deficits in the context of the above medical diagnosis(es)? Yes Addressing deficits in the following areas: balance, endurance, locomotion, strength, transferring, bathing, dressing and toileting 5. Can the patient actively participate in an intensive therapy program of at least 3 hrs of therapy 5 days a week? Yes 6.  The potential for patient to make measurable gains while on inpatient rehab is excellent 7. Anticipated functional outcomes upon discharge from inpatients are: supervision PT, supervision OT, n/a SLP 8. Estimated rehab length of stay to reach the above functional goals is: 7-10 days 9. Anticipated D/C setting: Home 10. Anticipated post D/C treatments: Outpatient therapy 11. Overall Rehab/Functional Prognosis: excellent  MD Signature:

## 2019-01-15 NOTE — Progress Notes (Signed)
Patient transferred to floor from [place] via bed by staff, denies pain or distress at this. Patient oriented to Rehab.  Call bell with reach and bed in lowest position.

## 2019-01-15 NOTE — Plan of Care (Signed)
Progressing towards goals

## 2019-01-15 NOTE — Progress Notes (Signed)
Inpatient Rehab Admissions Coordinator:   I have insurance authorization and a bed available for pt to admit to CIR today. I await word from attending team that he is ready for admit.    Shann Medal, PT, DPT Admissions Coordinator [phone] 01/15/19  3:26 PM

## 2019-01-15 NOTE — Progress Notes (Signed)
Physical Therapy Treatment Patient Details Name: Jeremy Grant MRN: [id number] DOB: 1961/03/08 Today's Date: 01/15/2019    History of Present Illness Pt is a 58 y/o M admitted on 01/12/2019 for LLE weakness x 3 days & inability to walk. 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 PMH significant for arthritis, CHF, combined systolic & diastolic cardiac dysfunction, HTN, nonischemic cardiomyopathy.    PT Comments    Patient seen for mobility progression. Current plan remains appropriate.    Follow Up Recommendations  CIR     Equipment Recommendations  (TBD in next venue)    Recommendations for Other Services       Precautions / Restrictions Precautions Precautions: Fall Restrictions Weight Bearing Restrictions: No    Mobility  Bed Mobility Overal bed mobility: Needs Assistance Bed Mobility: Supine to Sit;Sit to Supine     Supine to sit: Min assist Sit to supine: Supervision   General bed mobility comments: assist to sit up in bed due to posterior LOB  Transfers Overall transfer level: Needs assistance Equipment used: Rolling walker (2 wheeled) Transfers: Sit to/from Stand Sit to Stand: Mod assist;Max assist         General transfer comment: assistance to power up into standing and to maintain balance in standing; pt with L lateral and posterior LOB once in standing  Ambulation/Gait Ambulation/Gait assistance: Min assist;Mod assist Gait Distance (Feet): 70 Feet Assistive device: Rolling walker (2 wheeled) Gait Pattern/deviations: Step-to pattern;Decreased stance time - left;Decreased step length - right;Ataxic;Scissoring Gait velocity: decreased   General Gait Details: L LE incoordination and near scissoring with turns; assistance for balance and tactile cues/assist to decrease L knee hyperextension during stance phase   Stairs             Wheelchair Mobility    Modified Rankin (Stroke Patients  Only) Modified Rankin (Stroke Patients Only) Pre-Morbid Rankin Score: Moderate disability Modified Rankin: Moderately severe disability     Balance Overall balance assessment: Needs assistance Sitting-balance support: Feet supported;Bilateral upper extremity supported Sitting balance-Leahy Scale: Poor       Standing balance-Leahy Scale: Poor                              Cognition Arousal/Alertness: Awake/alert Behavior During Therapy: WFL for tasks assessed/performed Overall Cognitive Status: Within Functional Limits for tasks assessed                                        Exercises      General Comments        Pertinent Vitals/Pain Pain Assessment: No/denies pain    Home Living                      Prior Function            PT Goals (current goals can now be found in the care plan section) Acute Rehab PT Goals Patient Stated Goal: to get better Progress towards PT goals: Progressing toward goals    Frequency    Min 4X/week      PT Plan Current plan remains appropriate    Co-evaluation              AM-PAC PT "6 Clicks" Mobility   Outcome Measure  Help needed turning from your back to your side  while in a flat bed without using bedrails?: None Help needed moving from lying on your back to sitting on the side of a flat bed without using bedrails?: None Help needed moving to and from a bed to a chair (including a wheelchair)?: A Little Help needed standing up from a chair using your arms (e.g., wheelchair or bedside chair)?: A Little Help needed to walk in hospital room?: A Little Help needed climbing 3-5 steps with a railing? : A Lot 6 Click Score: 19    End of Session Equipment Utilized During Treatment: Gait belt Activity Tolerance: Patient tolerated treatment well Patient left: with call bell/phone within reach;in bed;with bed alarm set;with family/visitor present Nurse Communication: Mobility  status PT Visit Diagnosis: Other abnormalities of gait and mobility (R26.89);Muscle weakness (generalized) (M62.81);Difficulty in walking, not elsewhere classified (R26.2)     Time: 1545-1600 PT Time Calculation (min) (ACUTE ONLY): 15 min  Charges:  $Gait Training: 8-22 mins                     Earney Navy, PTA Acute Rehabilitation Services Pager: [phone] Office: [phone]     Darliss Cheney 01/15/2019, 4:50 PM

## 2019-01-15 NOTE — H&P (Signed)
Physical Medicine and Rehabilitation Admission H&P    No chief complaint on file. 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  Per chart review lives with spouse.  Independent prior to admission he had been working at [employer] up until March 2020.  1 level home with 3 steps to entry.  Presented 01/12/2019 when his left leg became weak and fell without loss of consciousness.  He initially did not come to the hospital.  Hemoglobin 8.8, creatinine 2.95, urine drug screen negative.  MRI of the brain showed patchy area of acute infarction in the deep white matter on the right including the corpus callosum possible watershed type infarct.  Patient did not receive TPA.  MRI lumbar spine showed grade 1 anterior listhesis of L4 on L5 with mild central cord protrusion.  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  Echocardiogram with ejection fraction of 56% normal systolic function.  Carotid Dopplers to 1-39% ICA stenosis.  Neurology follow-up maintained on aspirin and Plavix for CVA prophylaxis x3 weeks then aspirin alone.  No plan for TEE or loop recorder at this time.  Subcutaneous Lovenox for DVT prophylaxis.  Tolerating a regular diet.  Therapy evaluations completed and patient was admitted for a comprehensive rehab program.  Pt reports that his LLE is significantly weak- his LUE feels a little weak.  He hasn't had more than 1 BM during  admission- LBM was yesterday- still feels constipated.  Review of Systems  Constitutional: Negative for chills and fever.  HENT: Negative for hearing loss.   Eyes: Negative for blurred vision and double vision.  Cardiovascular: Positive for leg swelling. Negative for chest pain and palpitations.  Gastrointestinal: Positive for constipation. Negative for heartburn, nausea and vomiting.  Genitourinary: Negative for dysuria, flank pain and hematuria.  Musculoskeletal: Positive for joint pain and myalgias.       Recent fall PTA   Psychiatric/Behavioral: The patient has insomnia.   All other systems reviewed and are negative.  Past Medical History:  Diagnosis Date  . Arthritis   . CHF (congestive heart failure) ([place])   . Combined systolic and diastolic cardiac dysfunction    Echo 11/03/2013 EF 38%, grade 3 diastolic dysfunction  . Hypertension   . NICM (nonischemic cardiomyopathy) ([place])    a. L/RHC (11/05/13): RA: 3, RV 52/5, PA 49/19 (31), PCWP 10, AO 166/93, PA 67%, Fick CO/CI: 5.71/2.97, Lmain: normal, LAD: large, without signficant dz, first diagonal has 20% dz at ostium, LCx: normal, RCA: 30% stenosis at the bifurcation of PDA and PLOM   Past Surgical History:  Procedure Laterality Date  . IR FLUORO GUIDE CV LINE RIGHT  07/25/2018  . IR US GUIDE VASC ACCESS RIGHT  07/25/2018  . LEFT AND RIGHT HEART CATHETERIZATION WITH CORONARY ANGIOGRAM N/A 11/05/2013   Procedure: LEFT AND RIGHT HEART CATHETERIZATION WITH CORONARY ANGIOGRAM;  Surgeon: Peter M Martinique, MD;  Location: [facility] C[facility] LAB;  Service: Cardiovascular;  Laterality: N/A;  . RIGHT HEART CATHETERIZATION N/A 01/14/2014   Procedure: RIGHT HEART CATH;  Surgeon: Elby Showers  Aundra Dubin, MD;  Location: [facility] CATH LAB;  Service: Cardiovascular;  Laterality: N/A;   Family History  Problem Relation Age of Onset  . Heart attack Father   . Heart disease Father   . Arrhythmia Father   . Hypertension Brother   . Hypertension Brother    Social History:   reports that he has never smoked. He has never used smokeless tobacco. He reports current alcohol use of about 3.0 standard drinks of alcohol per week. He reports that he does not use drugs.  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 Hydralazine Hcl    Medications Prior to Admission  Medication Sig Dispense Refill  . [START ON 01/16/2019] aspirin 325 MG tablet Take 1 tablet (325 mg total) by mouth daily. 30 tablet 0  . atorvastatin (LIPITOR) 80 MG tablet Take 1 tablet (80 mg total) by mouth daily at 6 PM. 30 tablet 0  . [START ON 01/16/2019] clopidogrel (PLAVIX) 75 MG tablet Take 1 tablet (75 mg total) by mouth daily for 18 days. 18 tablet 0  . [START ON 01/16/2019] cyclophosphamide (CYTOXAN) 25 MG capsule Take 3 capsules (75 mg total) by mouth daily with lunch for 10 days. Take with food to minimize GI upset. Take early in the day and maintain hydration. 30 capsule 0  . [START ON 01/16/2019] predniSONE (DELTASONE) 5 MG tablet Take 1 tablet (5 mg total) by mouth daily with breakfast. 30 tablet 0    Drug Regimen Review Drug regimen was reviewed and remains appropriate with no significant issues identified  Home:  as above   Functional History:    Functional Status:  Mobility:          ADL:    Cognition: Cognition Orientation Level: (P) Oriented X4    Physical Exam: There were no vitals taken for this visit. Physical Exam  Constitutional: He appears well-developed and well-nourished. No distress.  Wife at bedside; wearing eyeglasses, appropriate, awake, and in NAD  HENT:  Head: Normocephalic and atraumatic.  Nose: Nose normal.  Mouth/Throat: No oropharyngeal exudate.  Eyes: EOM are normal.  Neck: Normal range of motion. Neck supple. No tracheal deviation present. No thyromegaly present.  Cardiovascular: Normal rate, regular rhythm and normal heart sounds. Exam  reveals no gallop and no friction rub.  No murmur heard. Respiratory: Effort normal and breath sounds normal. No respiratory distress. He has no wheezes. He has no rales.  GI: Soft. He exhibits distension. There is no abdominal tenderness.  Hypoactive BS  Musculoskeletal: Normal range of motion.        General: No tenderness or edema.     Comments: RUE and RLE 5/5 LUE biceps 5-/5, triceps 4+/5, WE 5-/5, grip 5-/5, finger abd 4/5  LLE- HF 2/5, KE 1/5, DF 0/5, PF 0/5, EHL 0/5  Neurological: Coordination normal.  Alert/NAD/oriented x 3. Follows commands.Speech is fluent with good insight.Cooperative with exam  Sensation intact to light touch in all 4 extremities; no clonus, no hoffman's, no babinski, DTRs 2+ in LUE and RUE; 3+ in LLE and 2+ in RLE  Finger to nose intact B/L, cannot test heel to shin on left due to weakness  Skin:  No skin breakdown seen on exam; had some purplish bruising on arms  Psychiatric: He has a normal mood and affect. His behavior is normal.    No results found for  this or any previous visit (from the past 48 hour(s)). No results found.     Medical Problem List and Plan: 1.  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  Plan 30-day cardiac event monitor as outpatient 2.  Antithrombotics: -DVT/anticoagulation: Lovenox  -antiplatelet therapy: Aspirin 325 mg, Plavix 75 mg daily x3 weeks then aspirin alone 3. Pain Management: Tylenol as needed 4. Mood: Provide emotional support  -antipsychotic agents: N/A 5. Neuropsych: This patient is capable of making decisions on his own behalf. 6. Skin/Wound Care: Routine skin checks 7. Fluids/Electrolytes/Nutrition: Routine in and outs with follow-up chemistries 8.  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  Monitor for any signs of fluid overload 9.  Hypertension.  Monitor with increased  mobility 10.  ANCA necrotizing concentric glomerulonephritis.  Continue Cytoxan as well as chronic prednisone.  Follow-up per renal services.  Admission creatinine of 2.95 11.  Pancytopenia/chronic anemia.  Follow-up labs patient had been receiving Aranesp as an outpatient with renal services. 12.  Hyperlipidemia.  Lipitor 13.  History of alcohol use.  Counseling and monitor. 14. Concern for spasticity formation- will con't to monitor- since has elevated DTRs on LLE, is at risk for spasticity development. 15. Constipation- will get back on regular elimination/bowel regimen 16. Dispo- expect 7-10 days, but will determine per team conference  Courtney Heys, MD 01/15/2019

## 2019-01-16 ENCOUNTER — Inpatient Hospital Stay (HOSPITAL_COMMUNITY): Payer: BC Managed Care – PPO

## 2019-01-16 ENCOUNTER — Other Ambulatory Visit: Payer: Self-pay | Admitting: Cardiology

## 2019-01-16 ENCOUNTER — Inpatient Hospital Stay (HOSPITAL_COMMUNITY): Payer: BC Managed Care – PPO | Admitting: Occupational Therapy

## 2019-01-16 ENCOUNTER — Inpatient Hospital Stay (HOSPITAL_COMMUNITY): Payer: BC Managed Care – PPO | Admitting: Physical Therapy

## 2019-01-16 DIAGNOSIS — I63319 Cerebral infarction due to thrombosis of unspecified middle cerebral artery: Secondary | ICD-10-CM

## 2019-01-16 DIAGNOSIS — I63521 Cerebral infarction due to unspecified occlusion or stenosis of right anterior cerebral artery: Secondary | ICD-10-CM

## 2019-01-16 LAB — COMPREHENSIVE METABOLIC PANEL
ALT: 19 U/L (ref 0–44)
AST: 17 U/L (ref 15–41)
Albumin: 3.8 g/dL (ref 3.5–5.0)
Alkaline Phosphatase: 44 U/L (ref 38–126)
Anion gap: 10 (ref 5–15)
BUN: 67 mg/dL — ABNORMAL HIGH (ref 6–20)
CO2: 21 mmol/L — ABNORMAL LOW (ref 22–32)
Calcium: 9.1 mg/dL (ref 8.9–10.3)
Chloride: 112 mmol/L — ABNORMAL HIGH (ref 98–111)
Creatinine, Ser: 3.03 mg/dL — ABNORMAL HIGH (ref 0.61–1.24)
GFR calc Af Amer: 25 mL/min — ABNORMAL LOW (ref >60)
GFR calc non Af Amer: 21 mL/min — ABNORMAL LOW (ref >60)
Glucose, Bld: 122 mg/dL — ABNORMAL HIGH (ref 70–99)
Potassium: 4.1 mmol/L (ref 3.5–5.1)
Sodium: 143 mmol/L (ref 135–145)
Total Bilirubin: 1 mg/dL (ref 0.3–1.2)
Total Protein: 6.3 g/dL — ABNORMAL LOW (ref 6.5–8.1)

## 2019-01-16 LAB — CBC WITH DIFFERENTIAL/PLATELET
Abs Immature Granulocytes: 0.03 10*3/uL (ref 0.00–0.07)
Basophils Absolute: 0 10*3/uL (ref 0.0–0.1)
Basophils Relative: 1 %
Eosinophils Absolute: 0.1 10*3/uL (ref 0.0–0.5)
Eosinophils Relative: 1 %
HCT: 23.2 % — ABNORMAL LOW (ref 39.0–52.0)
Hemoglobin: 8.3 g/dL — ABNORMAL LOW (ref 13.0–17.0)
Immature Granulocytes: 1 %
Lymphocytes Relative: 11 %
Lymphs Abs: 0.4 10*3/uL — ABNORMAL LOW (ref 0.7–4.0)
MCH: 35.9 pg — ABNORMAL HIGH (ref 26.0–34.0)
MCHC: 35.8 g/dL (ref 30.0–36.0)
MCV: 100.4 fL — ABNORMAL HIGH (ref 80.0–100.0)
Monocytes Absolute: 0.4 10*3/uL (ref 0.1–1.0)
Monocytes Relative: 12 %
Neutro Abs: 2.6 10*3/uL (ref 1.7–7.7)
Neutrophils Relative %: 74 %
Platelets: 109 10*3/uL — ABNORMAL LOW (ref 150–400)
RBC: 2.31 MIL/uL — ABNORMAL LOW (ref 4.22–5.81)
RDW: 13.6 % (ref 11.5–15.5)
WBC: 3.5 10*3/uL — ABNORMAL LOW (ref 4.0–10.5)
nRBC: 0 % (ref 0.0–0.2)

## 2019-01-16 NOTE — Evaluation (Signed)
Physical Therapy Assessment and Plan  Patient Details  Name: Jeremy Grant MRN: [id number] Date of Birth: 10-Jan-1961  PT Diagnosis: Abnormal posture, Difficulty walking, Hemiplegia non-dominant and Muscle weakness Rehab Potential: Good ELOS: 10-14 days   Today's Date: 01/16/2019 PT Individual Time: 4665-9935 PT Individual Time Calculation (min): 77 min    Problem List:  Patient Active Problem List   Diagnosis Date Noted  . 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) 01/15/2019  . Weakness   . CVA (cerebral vascular accident) ([place]) 01/12/2019  . Malnutrition of moderate degree 07/31/2018  . Elevated serum protein level   . Multiple myeloma ([place])   . Pancytopenia ([place])   . Normocytic normochromic anemia 07/16/2018  . Acute kidney failure ([place]) 07/16/2018  . NICM (nonischemic cardiomyopathy) ([place]) 11/12/2013  . History of ETOH abuse 11/12/2013  . Chronic combined systolic and diastolic CHF (congestive heart failure) ([place]) 11/12/2013  . Congestive dilated cardiomyopathy ([place]) 11/04/2013  . Malignant hypertension     Past Medical History:  Past Medical History:  Diagnosis Date  . Arthritis   . CHF (congestive heart failure) ([place])   . Combined systolic and diastolic cardiac dysfunction    Echo 11/03/2013 EF 70%, grade 3 diastolic dysfunction  . Hypertension   . NICM (nonischemic cardiomyopathy) ([place])    a. L/RHC (11/05/13): RA: 3, RV 52/5, PA 49/19 (31), PCWP 10, AO 166/93, PA 67%, Fick CO/CI: 5.71/2.97, Lmain: normal, LAD: large, without signficant dz, first diagonal has 20% dz at ostium, LCx: normal, RCA: 30% stenosis at the bifurcation of PDA and PLOM   Past Surgical History:  Past Surgical History:  Procedure Laterality Date  . IR FLUORO GUIDE CV LINE RIGHT  07/25/2018  . IR US GUIDE VASC ACCESS RIGHT  07/25/2018  . LEFT AND RIGHT HEART CATHETERIZATION WITH CORONARY ANGIOGRAM N/A 11/05/2013   Procedure: LEFT AND RIGHT HEART CATHETERIZATION WITH  CORONARY ANGIOGRAM;  Surgeon: Peter M Martinique, MD;  Location: [facility] CATH LAB;  Service: Cardiovascular;  Laterality: N/A;  . 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  Per chart review lives with spouse.  Independent prior to admission he had been working at [employer] up until March 2020.  1 level home with 3 steps to entry.  Presented 01/12/2019 when his left leg became weak and fell without loss of consciousness.  He initially did not come to the hospital.  Hemoglobin 8.8, creatinine 2.95, urine drug screen negative.  MRI of the brain showed patchy area of acute infarction in the deep white matter on the right including the corpus callosum possible watershed type infarct.  Patient did not receive TPA.  MRI lumbar spine showed grade 1 anterior listhesis of L4 on L5 with mild central cord protrusion.  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  Echocardiogram with ejection fraction of 17% normal systolic function.  Carotid Dopplers to 1-39% ICA stenosis.  Neurology follow-up maintained on aspirin and Plavix for CVA prophylaxis x3 weeks then aspirin alone.  No plan for TEE or loop recorder at this time.  Subcutaneous Lovenox for DVT prophylaxis.   Tolerating a regular diet. Patient transferred to CIR on 01/15/2019 .   Patient currently requires mod with mobility secondary to muscle weakness, muscle joint tightness and muscle paralysis, decreased cardiorespiratoy endurance, impaired timing and sequencing and decreased coordination, decreased attention to left and decreased sitting balance, decreased standing balance, decreased postural control, hemiplegia and decreased balance strategies.  Prior to hospitalization, patient was independent  with mobility and lived with Spouse in a House home.  Home access is 3Stairs to enter.  Patient will benefit from skilled PT intervention to maximize safe functional mobility, minimize fall risk and decrease caregiver burden for planned discharge home with intermittent assist.  Anticipate patient will benefit from [facility] at [facility].  PT - End of Session Endurance Deficit: Yes PT Assessment Rehab Potential (ACUTE/IP ONLY): Good PT Barriers to Discharge: Decreased caregiver support PT Barriers to Discharge Comments: pt reports wife will be able to provide intermittent assist due to work schedule PT Patient demonstrates impairments in the following area(s): Balance;Endurance;Motor;Safety;Sensory;Skin Integrity;Perception PT Transfers Functional Problem(s): Bed to Chair;Bed Mobility;Car;Furniture;Floor PT Locomotion Functional Problem(s): Ambulation;Stairs PT Plan PT Intensity: Minimum of 1-2 x/day ,45 to 90 minutes PT Frequency: 5 out of 7 days PT Duration Estimated Length of Stay: 10-14 days PT Treatment/Interventions: Ambulation/gait training;Balance/vestibular training;Community reintegration;Discharge planning;Disease management/prevention;DME/adaptive equipment instruction;Functional electrical stimulation;Functional mobility training;Neuromuscular re-education;Pain management;Patient/family education;Psychosocial support;Skin care/wound management;Splinting/orthotics;Stair training;Therapeutic  Activities;Therapeutic Exercise;UE/LE Strength taining/ROM;UE/LE Coordination activities;Wheelchair propulsion/positioning;Visual/perceptual remediation/compensation PT Transfers Anticipated Outcome(s): mod I basic transfers; supervision car PT Locomotion Anticipated Outcome(s): mod I household gait; supervision stairs PT Recommendation Recommendations for Other Services: Therapeutic Recreation consult Therapeutic Recreation Interventions: Outing/community reintergration Follow Up Recommendations: Home health PT Patient destination: Home Equipment Recommended: Rolling walker with 5" wheels Equipment Details: pt has rollator and [facility]  Skilled Therapeutic Intervention Evaluation completed (see details above and below) with education on PT POC and goals and individual treatment initiated with focus on NMR during bed mobility, transfers, gait, and stairs. Pt requires mod to max initially and progresses to min to mod with cues and education. Pt with posterior bias tendency requiring cues and facilitation for anterior weightshift. Initially when coming to EOB, pt requires mod assist to recover and LOB posteriorly. Rest of session able to maintain sitting balance with supervision to min assist dynamically. Decreased awareness of positioning in w/c noted requiring cues to correct.   Initiated gait training with RW with min to mod assist with improved ability to advance LLE with cues for heel strike, hip flexion vs circumduction and upright posture. Facilitation for weightshifting. Able to gait x 50' with turns and cues for technique.   PT Evaluation Precautions/Restrictions Precautions Precautions: Fall Restrictions Weight Bearing Restrictions: No Pain  Denies pain. Home Living/Prior Functioning Home Living Living Arrangements: Spouse/significant other Available Help at Discharge: Available PRN/intermittently;Family Type of Home: House Home Access: Stairs to enter [employer] of  Steps: 3 Entrance Stairs-Rails: Can reach both Home Layout: One level Bathroom Accessibility: Yes  Lives With: Spouse Prior Function Level of Independence: Independent with basic ADLs;Independent with gait;Independent with transfers  Able to Take Stairs?: Yes Driving: Yes Vocation: Full time employment Comments: working at [employer] until March 2020 Vision/Perception  Perception Perception: Impaired Inattention/Neglect: (mild L body inattention)  Cognition Overall Cognitive Status: Within Functional Limits for  tasks assessed Sensation Sensation Light Touch: Appears Intact Proprioception: Impaired Detail Proprioception Impaired Details: Impaired LLE(absent in big toe) Coordination Gross Motor Movements are Fluid and Coordinated: No Motor  Motor Motor: Hemiplegia;Abnormal postural alignment and control Motor - Skilled Clinical Observations: L hemiplegia (LLE more impaired than LUE)  Mobility Bed Mobility Bed Mobility: Rolling Left;Supine to Sit Rolling Left: Minimal Assistance - Patient > 75% Supine to Sit: Moderate Assistance - Patient 50-74% Transfers Transfers: Sit to Stand;Stand to Sit;Stand Pivot Transfers Sit to Stand: Moderate Assistance - Patient 50-74% Stand to Sit: Moderate Assistance - Patient 50-74% Stand Pivot Transfers: Moderate Assistance - Patient 50 - 74%;Maximal Assistance - Patient 25 - 49% Stand Pivot Transfer Details: Verbal cues for sequencing;Verbal cues for precautions/safety;Verbal cues for gait pattern;Manual facilitation for weight shifting Locomotion  Gait Gait Distance (Feet): 50 Feet Assistive device: None Gait Gait Pattern: Impaired Decreased heel strike, L knee hyperextension; narrow BOS; decreased LLE stance; decreased step length Stairs / Additional Locomotion Stairs: Yes Stairs Assistance: Maximal Assistance - Patient 25 - 49% Stair Management Technique: Two rails;Step to pattern Number of Stairs: 4 Height of Stairs: 6 Wheelchair  Mobility Wheelchair Mobility: No  Trunk/Postural Assessment  Cervical Assessment Cervical Assessment: Within Functional Limits Thoracic Assessment Thoracic Assessment: Exceptions to WFL(kyphosis) Lumbar Assessment Lumbar Assessment: (posterior pelvic tilt) Postural Control Postural Control: Deficits on evaluation Trunk Control: decreased in seated and standing Righting Reactions: delayed and inadequate  Balance Balance Balance Assessed: Yes Static Sitting Balance Static Sitting - Level of Assistance: 4: Min assist Dynamic Sitting Balance Dynamic Sitting - Level of Assistance: 3: Mod assist(initially required mod assist during donning of pants) Sitting balance - Comments: posterior bias Static Standing Balance Static Standing - Level of Assistance: 4: Min assist(posterior bias no UE support) Dynamic Standing Balance Dynamic Standing - Level of Assistance: 2: Max assist;3: Mod assist(posterior bias, no UE support) Extremity Assessment    see OT eval for UE assessment   RLE Assessment RLE Assessment: Within Functional Limits LLE Assessment LLE Assessment: Exceptions to [facility] General Strength Comments: hip and knee grossly 3-5; no active movement noted at ankle    Refer to Care Plan for Long Term Goals  Recommendations for other services: Therapeutic Recreation  Outing/community reintegration  Discharge Criteria: Patient will be discharged from PT if patient refuses treatment 3 consecutive times without medical reason, if treatment goals not met, if there is a change in medical status, if patient makes no progress towards goals or if patient is discharged from hospital.  The above assessment, treatment plan, treatment alternatives and goals were discussed and mutually agreed upon: by patient  Juanna Cao, PT, DPT, CBIS  01/16/2019, 12:41 PM

## 2019-01-16 NOTE — Progress Notes (Signed)
Courtney Heys, MD  Physician  Physical Medicine and Rehabilitation  PMR Pre-admission  Signed  Date of Service:  01/15/2019 3:31 PM      Related encounter: ED to Hosp-Admission (Discharged) from 01/12/2019 in [facility]      Signed         Show:Clear all '[x]'$ Manual'[x]'$ Template'[x]'$ Copied  Added by: '[x]'$ Lovorn, Megan, MD'[x]'$ Michel Santee, PT  '[]'$ Hover for details PMR Admission Coordinator Pre-Admission Assessment  Patient: Jeremy Grant is an 58 y.o., male MRN: [id number] DOB: July 30, 1960 Height: '6\' 1"'$  (185.4 cm) Weight: 63.7 kg  Insurance Information HMO:     PPO: yes     PCP:      IPA:      80/20:      OTHER:  PRIMARY: BCBS      Policy#: [id number]      Subscriber: patient CM Name: Roselyn Reef      Phone#:      Fax#:  Pre-Cert#: [id number]  Marine for CIR provided by Roselyn Reef at [facility] with updates due to Janett Labella at fax [phone]    Employer:  Benefits:  Phone #: [phone]     Name:  Eff. Date: 05/14/2018     Deduct: $2,800 (met in full)      Out of Pocket Max: [phone] (met in full)      Life Max:  CIR: 80%      SNF: 80%  Outpatient: 80%     Co-Pay: 20% Home Health: 80%      Co-Pay: 20% DME: 80%     Co-Pay: 20% Providers: preferred network  SECONDARY:       Policy#:       Subscriber:  CM Name:       Phone#:      Fax#: Pre-Cert#:       Employer: Benefits:  Phone #:      Name:  Eff. Date:      Deduct:       Out of Pocket Max:       Life Max:  CIR:       SNF:  Outpatient:      Co-Pay:  Home Health:       Co-Pay:  DME:      Co-Pay:   Medicaid Application Date:       Case Manager:  Disability Application Date:       Case Worker:   The "Data Collection Information Summary" for patients in Inpatient Rehabilitation Facilities with attached "Privacy Act [place] Records" was provided and verbally reviewed with: N/A  Emergency Contact Information         Contact Information    Name Relation Home Work [place] Spouse [phone]  [phone]   Jaeceon, Michelin Mother [phone]        Current Medical History  Patient Admitting Diagnosis: CVA  History of Present Illness: Jeremy Grant is a 58 year old right-handed male with history of nonischemic cardiomyopathy with right heart catheterization 2015, shingles with postherpetic neuralgia hypertension, combined systolic and diastolic congestive heart failure with ejection fraction of 20%, alcohol abuse, CKD stage IV with creatinine 2.95 as well as 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 Per chart review lives with spouse. Independent prior to admission he had been working at [employer] up until March 2020. 1 level home with 3 steps to entry. Presented 01/12/2019 when his left leg became weak and fell  without loss of consciousness. He initially did not come to the hospital.Hemoglobin 8.8, creatinine 2.95, urine drug screen negative. MRI of the brain showed patchy area of acute infarction in the deep white matter on the right including the corpus callosum possible watershed type infarct. Patient did not receive TPA. MRI lumbar spine showed grade 1 anterior listhesis of L4 on L5 with mild central cord protrusion. 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 Echocardiogram with ejection fraction of 00% normal systolic function. Carotid Dopplers to 1-39% ICA stenosis. Neurology follow-up maintained on aspirin and Plavix for CVA prophylaxis x3 weeks then aspirin alone. No plan for TEE or loop recorder at this time. Subcutaneous Lovenox for DVT prophylaxis. Tolerating a regular diet. Therapy evaluations completed and patient was recommended for a comprehensive rehab program.  Complete  NIHSS TOTAL: 2  Patient's medical record from [facility]  has been reviewed by the rehabilitation admission coordinator and physician.  Past Medical History      Past Medical History:  Diagnosis Date  . Arthritis   . CHF (congestive heart failure) ([place])   . Combined systolic and diastolic cardiac dysfunction    Echo 11/03/2013 EF 37%, grade 3 diastolic dysfunction  . Hypertension   . NICM (nonischemic cardiomyopathy) ([place])    a. L/RHC (11/05/13): RA: 3, RV 52/5, PA 49/19 (31), PCWP 10, AO 166/93, PA 67%, Fick CO/CI: 5.71/2.97, Lmain: normal, LAD: large, without signficant dz, first diagonal has 20% dz at ostium, LCx: normal, RCA: 30% stenosis at the bifurcation of PDA and PLOM    Family History   family history includes Arrhythmia in his father; Heart attack in his father; Heart disease in his father; Hypertension in his brother and brother.  Prior Rehab/Hospitalizations Has the patient had prior rehab or hospitalizations prior to admission? Yes  Has the patient had major surgery during 100 days prior to admission? No              Current Medications  Current Facility-Administered Medications:  .  acetaminophen (TYLENOL) tablet 650 mg, 650 mg, Oral, Q6H PRN, Madalyn Rob, MD, 650 mg at 01/15/19 1434 .  aspirin tablet 325 mg, 325 mg, Oral, Daily, Neva Seat, MD, 325 mg at 01/15/19 0932 .  atorvastatin (LIPITOR) tablet 80 mg, 80 mg, Oral, q1800, Neva Seat, MD, 80 mg at 01/14/19 1708 .  clopidogrel (PLAVIX) tablet 75 mg, 75 mg, Oral, Daily, Greta Doom, MD, 75 mg at 01/15/19 0931 .  cyclophosphamide (CYTOXAN) capsule 75 mg, 75 mg, Oral, Q lunch, Rosita Fire, MD, 75 mg at 01/15/19 1225 .  enoxaparin (LOVENOX) injection 30 mg, 30 mg, Subcutaneous, Q24H, Neva Seat, MD, 30 mg at 01/14/19 1708 .  predniSONE (DELTASONE) tablet 5 mg, 5 mg, Oral, Q breakfast, Neva Seat, MD, 5 mg at 01/15/19 0931 .  sodium chloride flush  (NS) 0.9 % injection 3 mL, 3 mL, Intravenous, Q12H, Neva Seat, MD, 3 mL at 01/15/19 0932  Patients Current Diet:     Diet Order                  Diet Heart Room service appropriate? Yes; Fluid consistency: Thin  Diet effective now               Precautions / Restrictions Precautions Precautions: Fall Restrictions Weight Bearing Restrictions: No   Has the patient had 2 or more falls or a fall with injury in the  past year? Yes  Prior Activity Level Community (5-7x/wk): was working FT until he had vasculitis in March, has been on leave since that time with multiple medical complications  Prior Functional Level Self Care: Did the patient need help bathing, dressing, using the toilet or eating? Independent  Indoor Mobility: Did the patient need assistance with walking from room to room (with or without device)? Independent  Stairs: Did the patient need assistance with internal or external stairs (with or without device)? Independent  Functional Cognition: Did the patient need help planning regular tasks such as shopping or remembering to take medications? Independent  Home Assistive Devices / Equipment Home Assistive Devices/Equipment: Oxygen, Environmental consultant (specify type) Home Equipment: Walker - 4 wheels, Cane - single point, Hand held shower head  Prior Device Use: Indicate devices/aids used by the patient prior to current illness, exacerbation or injury? None of the above  Current Functional Level Cognition  Overall Cognitive Status: Within Functional Limits for tasks assessed Orientation Level: Oriented X4    Extremity Assessment (includes Sensation/Coordination)  Upper Extremity Assessment: LUE deficits/detail LUE Deficits / Details: decreased motor control in LUE, mild dysmetria with finger to nose LUE Sensation: WNL LUE Coordination: decreased fine motor       ADLs  Overall ADL's : Needs assistance/impaired Eating/Feeding: Set up  Grooming: Minimal assistance, Standing, Wash/dry hands, Wash/dry face, Oral care Grooming Details (indicate cue type and reason): therapist provided blocking at Lt knee to prevent hyperextension in standing Upper Body Bathing: Min guard, Sitting Lower Body Bathing: Minimal assistance, Moderate assistance, Sit to/from stand Upper Body Dressing : Min guard, Sitting Lower Body Dressing: Moderate assistance, Sit to/from stand Toilet Transfer: Moderate assistance, Ambulation, RW Toilet Transfer Details (indicate cue type and reason): ambulated to toilet with min-mod assist with RW with therapist providing tactile cues/facilitation for safe positioning of LLE during ambulation Functional mobility during ADLs: Minimal assistance, Moderate assistance, Rolling walker    Mobility  Overal bed mobility: Needs Assistance Bed Mobility: Supine to Sit, Sit to Supine Supine to sit: Supervision Sit to supine: Min assist General bed mobility comments: extra time & cuing to initiate supine>sit, initial LOB to Lt when returning to supine in bed    Transfers  Overall transfer level: Needs assistance Equipment used: Rolling walker (2 wheeled) Transfers: Sit to/from Stand Sit to Stand: Min assist Stand pivot transfers: Min assist General transfer comment: occasional impaired ability to weight bear through LLE for support during sit>stand, very impaired safety awareness for stand>sit as pt abruptly sits in recliner without ensuring he's close enough or holding to armrests    Ambulation / Gait / Stairs / Wheelchair Mobility  Ambulation/Gait Ambulation/Gait assistance: Herbalist (Feet): 50 Feet Assistive device: Rolling walker (2 wheeled) General Gait Details: decreased LLE hip/knee flexion during swing phase, absent heel strike LLE, LLE genu recurvatum in stance phase with therapist providing tactile cuing to help prevent Gait velocity: decreased    Posture / Balance Dynamic Sitting  Balance Sitting balance - Comments: L lateral lean during static sitting balance with cuing to correct Balance Overall balance assessment: Needs assistance Sitting-balance support: Feet supported, Bilateral upper extremity supported Sitting balance-Leahy Scale: Poor Sitting balance - Comments: L lateral lean during static sitting balance with cuing to correct Standing balance-Leahy Scale: Poor Standing balance comment: BUE support on RW during standing/gait    Special needs/care consideration BiPAP/CPAP no CPM no Continuous Drip IV no Dialysis no        Days n/a Life Vest no Oxygen  no Special Bed no Trach Size no Wound Vac (area) no      Location n/a Skin ecchymosis to BUEs                          Bowel mgmt: continent, last BM 9/2 Bladder mgmt: incontinent Diabetic mgmt: no Behavioral consideration no Chemo/radiation no   Previous Home Environment (from acute therapy documentation) Living Arrangements: Spouse/significant other Available Help at Discharge: Available PRN/intermittently, Family Type of Home: House Home Layout: One level Home Access: Stairs to enter Entrance Stairs-Rails: Can reach both, Left, Right Entrance Stairs-Number of Steps: 3+4 Bathroom Shower/Tub: Optometrist: Yes How Accessible: Accessible via wheelchair Home Care Services: No  Discharge Living Setting Plans for Discharge Living Setting: Patient's home Type of Home at Discharge: House Discharge Home Layout: One level Discharge Home Access: Stairs to enter Entrance Stairs-Rails: Can reach both Entrance Stairs-Number of Steps: 3+1 Discharge Bathroom Shower/Tub: Tub/shower unit Discharge Bathroom Toilet: Standard Discharge Bathroom Accessibility: Yes How Accessible: Accessible via walker Does the patient have any problems obtaining your medications?: No  Social/Family/Support Systems Anticipated Caregiver: spouse, [name] Anticipated  Ambulance person Information: [phone] Caregiver Availability: 24/7 Discharge Plan Discussed with Primary Caregiver: Yes Is Caregiver In Agreement with Plan?: Yes Does Caregiver/Family have Issues with Lodging/Transportation while Pt is in Rehab?: No  Goals/Additional Needs Patient/Family Goal for Rehab: PT/OT mod I Expected length of stay: 7-10 days Dietary Needs: heart health/thin Pt/Family Agrees to Admission and willing to participate: Yes Program Orientation Provided & Reviewed with Pt/Caregiver Including Roles  & Responsibilities: Yes  Decrease burden of Care through IP rehab admission: n/a  Possible need for SNF placement upon discharge: No. Wife and pt aware that SNF is not an option following rehab and are prepared to provide 24/7 if needed at discharge  Patient Condition: I have reviewed medical records from [facility], spoken with CM, and patient and spouse. I met with patient at the bedside for inpatient rehabilitation assessment.  Patient will benefit from ongoing PT and OT, can actively participate in 3 hours of therapy a day 5 days of the week, and can make measurable gains during the admission.  Patient will also benefit from the coordinated team approach during an Inpatient Acute Rehabilitation admission.  The patient will receive intensive therapy as well as Rehabilitation physician, nursing, social worker, and care management interventions.  Due to bladder management, safety, skin/wound care, disease management, medication administration, pain management and patient education the patient requires 24 hour a day rehabilitation nursing.  The patient is currently min to mod assist  with mobility and basic ADLs.  Discharge setting and therapy post discharge at home with home health is anticipated.  Patient has agreed to participate in the Acute Inpatient Rehabilitation Program and will admit today.  Preadmission Screen Completed By:  Michel Santee, 01/15/2019  3:31 PM ______________________________________________________________________   Discussed status with Dr. Dagoberto Ligas on 01/15/19  at 3:35 PM  and received approval for admission today.  Admission Coordinator:  Michel Santee, PT, time 3:35 PM Sudie Grumbling 01/15/19    Assessment/Plan: Diagnosis: 1. Does the need for close, 24 hr/day Medical supervision in concert with the patient's rehab needs make it unreasonable for this patient to be served in a less intensive setting? Yes 2. Co-Morbidities requiring supervision/potential complications: ESRD/CKD stage IV, HTN, CHF with EF of 20%,  3. Due to bladder management, safety, skin/wound care, medication administration and patient education, does  the patient require 24 hr/day rehab nursing? Yes 4. Does the patient require coordinated care of a physician, rehab nurse, PT (1-2 hrs/day, 5 days/week) and OT (1-2 hrs/day, 5 days/week) to address physical and functional deficits in the context of the above medical diagnosis(es)? Yes Addressing deficits in the following areas: balance, endurance, locomotion, strength, transferring, bathing, dressing and toileting 5. Can the patient actively participate in an intensive therapy program of at least 3 hrs of therapy 5 days a week? Yes 6. The potential for patient to make measurable gains while on inpatient rehab is excellent 7. Anticipated functional outcomes upon discharge from inpatients are: supervision PT, supervision OT, n/a SLP 8. Estimated rehab length of stay to reach the above functional goals is: 7-10 days 9. Anticipated D/C setting: Home 10. Anticipated post D/C treatments: Outpatient therapy 11. Overall Rehab/Functional Prognosis: excellent  MD Signature:         Revision History

## 2019-01-16 NOTE — Progress Notes (Signed)
Social Work Assessment and Plan   Patient Details  Name: Jeremy Grant MRN: [id number] Date of Birth: 03-19-1961  Today's Date: 01/16/2019  Problem List:  Patient Active Problem List   Diagnosis Date Noted  . Acute unilateral cerebral infarction in a watershed distribution [facility]) 01/15/2019  . Weakness   . CVA (cerebral vascular accident) ([place]) 01/12/2019  . Malnutrition of moderate degree 07/31/2018  . Elevated serum protein level   . Multiple myeloma ([place])   . Pancytopenia ([place])   . Normocytic normochromic anemia 07/16/2018  . Acute kidney failure ([place]) 07/16/2018  . NICM (nonischemic cardiomyopathy) ([place]) 11/12/2013  . History of ETOH abuse 11/12/2013  . Chronic combined systolic and diastolic CHF (congestive heart failure) ([place]) 11/12/2013  . Congestive dilated cardiomyopathy ([place]) 11/04/2013  . Malignant hypertension    Past Medical History:  Past Medical History:  Diagnosis Date  . Arthritis   . CHF (congestive heart failure) ([place])   . Combined systolic and diastolic cardiac dysfunction    Echo 11/03/2013 EF 83%, grade 3 diastolic dysfunction  . Hypertension   . NICM (nonischemic cardiomyopathy) ([place])    a. L/RHC (11/05/13): RA: 3, RV 52/5, PA 49/19 (31), PCWP 10, AO 166/93, PA 67%, Fick CO/CI: 5.71/2.97, Lmain: normal, LAD: large, without signficant dz, first diagonal has 20% dz at ostium, LCx: normal, RCA: 30% stenosis at the bifurcation of PDA and PLOM   Past Surgical History:  Past Surgical History:  Procedure Laterality Date  . IR FLUORO GUIDE CV LINE RIGHT  07/25/2018  . IR US GUIDE VASC ACCESS RIGHT  07/25/2018  . LEFT AND RIGHT HEART CATHETERIZATION WITH CORONARY ANGIOGRAM N/A 11/05/2013   Procedure: LEFT AND RIGHT HEART CATHETERIZATION WITH CORONARY ANGIOGRAM;  Surgeon: Peter M Martinique, MD;  Location: [facility] CATH LAB;  Service: Cardiovascular;  Laterality: N/A;  . RIGHT HEART CATHETERIZATION N/A 01/14/2014   Procedure: RIGHT HEART CATH;  Surgeon: Larey Dresser, MD;   Location: [facility] CATH LAB;  Service: Cardiovascular;  Laterality: N/A;   Social History:  reports that he has never smoked. He has never used smokeless tobacco. He reports current alcohol use of about 3.0 standard drinks of alcohol per week. He reports that he does not use drugs.  Family / Support Systems Marital Status: Married Patient Roles: Spouse, Other (Comment)(employee) Spouse/Significant Other: Jeremy Grant [phone]  [phone] Other Supports: Friends and co-workers Anticipated Caregiver: Paediatric nurse Ability/Limitations of Caregiver: Wife works daytime, so at times pt will be alone Caregiver Availability: Evenings only Family Dynamics: Close with wife and extended family, they have friends and co-workers who are supportive and will check on them. Pt is hopeful he will regain his independence and be able to return to work  Social History Preferred language: English Religion: None Cultural Background: No issues Education: HS Read: Yes Write: Yes Employment Status: Employed Name of Employer: Writer Return to Work Plans: Was just going to be relased back to work when he had this stroke. Now he is asking for another LOA was suppose to return 9/9 Legal History/Current Legal Issues: No issues Guardian/Conservator: none-according to MD pt is capable of making his own decisions while here, will make sure wife is included per pt request   Abuse/Neglect Abuse/Neglect Assessment Can Be Completed: Yes Physical Abuse: Denies Verbal Abuse: Denies Sexual Abuse: Denies Exploitation of patient/patient's resources: Denies Self-Neglect: Denies  Emotional Status Pt's affect, behavior and adjustment status: Pt is motivated to regain his independence and get back to work. He feels good aobut his first  therapy this am and was told he did well. He seems to have good movement in his affected side. He has always been able to take care of himself and wants to get back to this. Recent  Psychosocial Issues: other health issues-kidney issues started in 07/2018 and was doing well and almost released to go back to work when this happened. Psychiatric History: No history seems to be coping appropriately but would benefit from seeing neuro-psych while here since [place] since 07/2018. Will make referral for him to see. Substance Abuse History: Hx-ETOH feels something he can stop anytime.  Patient / Family Perceptions, Expectations & Goals Pt/Family understanding of illness & functional limitations: Pt and wife can explain his kidney issues and now his stroke. He is still wondering reason he had a stroke, but MD told him it could have something to do wiht his blood infection which started this all. He does ask questions and feels he is listened to and knows the treatment plan going forward. Premorbid pt/family roles/activities: Husband, employee, friend, church member, etc Anticipated changes in roles/activities/participation: resume Pt/family expectations/goals: Pt states: " I hope to be able to take care of myself, my wife needs to work."  Wife states: " I hope he does well here and recovers from this stroke.'  [employer]: None Premorbid Home Care/DME Agencies: None Transportation available at discharge: Wife Resource referrals recommended: Neuropsychology, Support group (specify)  Discharge Planning Living Arrangements: Spouse/significant other Support Systems: Spouse/significant other, Other relatives, Water engineer, Social worker community Type of Residence: Private residence Insurance underwriter Resources: Multimedia programmer (specify)(BCBS) Museum/gallery curator Resources: Employment, Secondary school teacher Screen Referred: No Living Expenses: Medical laboratory scientific officer Management: Spouse, Patient Does the patient have any problems obtaining your medications?: No Home Management: Wife and pt-mostly wife Patient/Family Preliminary Plans: Return home with wife who works during  the day but will be there in the evenings. They have friends who will stop by and check on him during the day. Will await team's evaluations and work on discharge needs. Pt needs to be mod/i before going home if possible. Sw Barriers to Discharge: Lack of/limited family support Sw Barriers to Discharge Comments: Wife works during the day pt will need to be mod/i to be home alone Social Work Anticipated Follow Up Needs: HH/OP, Support Group  Clinical Impression Pleasant gentleman who is motivated to do well and recover from this stroke. He was planning to return to work when this happened. He is moving well. His wife is involved and supportive and will assist but does work and needs to due to loss of income from pt. Do feel pt would benefit from seeing neuro-psych while here and will make referral. Will await evaluations and work on discharge needs.  Elease Hashimoto 01/16/2019, 10:59 AM

## 2019-01-16 NOTE — Progress Notes (Signed)
Physical Therapy Session Note  Patient Details  Name: Jeremy Grant MRN: [id number] Date of Birth: 26-Sep-1960  Today's Date: 01/16/2019 PT Individual Time: 1120-1205 PT Individual Time Calculation (min): 45 min   Short Term Goals: Week 1:  PT Short Term Goal 1 (Week 1): Pt will be able to perform bed mobility with CGA PT Short Term Goal 2 (Week 1): Pt will be able to perform functional transfers with CGA PT Short Term Goal 3 (Week 1): Pt will be able to gait x 100' with min assist PT Short Term Goal 4 (Week 1): Pt will be able to perform 4 stairs with rails with CGA  Skilled Therapeutic Interventions/Progress Updates:    Pt received sitting in w/c with his wife present and pt agreeable to therapy session. Transported to/from gym in w/c. Performed stand pivot simulated car transfer (van height) using RW with mod assist for coming into standing and balance as well as placing L LE in/out of car - max cuing for sequencing of task. Stand pivot w/c>EOM, no AD, with mod assist for balance and L LE management as pt often demonstrates hyperextension during stance and decreased foot clearance when stepping. Repeated sit<>stands elevated EOM<>no UE support 2 sets of 10 reps focusing on L LE NMR with mirror feedback for L/R lateral weight shifting and manual facilitation for increased L weight shift and L LE hip/knee extension - min/mod assist for lifting/lowering and balance as pt demonstrates posterior lean in standing. Progressed to L LE NMR targeting stance phase for knee/hip extension activation and control to avoid knee hyperextension while stepping R LE up/down on 2" step, L UE support over therapist's shoulder, and mod assist for balance throughout. Stand pivot EOM>w/c, no AD, with mod assist for balance and max cuing for sequencing of stepping and tactile cuing for L LE knee extension control during stance. Transported back to room in w/c. Pt left seated in w/c with needs in reach, seat belt alarm on,  and his wife present.   Therapy Documentation Precautions:  Restrictions Weight Bearing Restrictions: No  Pain: Denies pain during session.  Therapy/Group: Individual Therapy  Tawana Scale, PT, DPT 01/16/2019, 7:54 AM

## 2019-01-16 NOTE — Progress Notes (Signed)
Inpatient Rehabilitation  Patient information reviewed and entered into eRehab system by Titianna Loomis M. Darcy Cordner, M.A., CCC/SLP, PPS Coordinator.  Information including medical coding, functional ability and quality indicators will be reviewed and updated through discharge.    

## 2019-01-16 NOTE — Care Management Note (Signed)
[facility] Individual Statement of Services  Patient Name:  Jeremy Grant  Date:  01/16/2019  Welcome to the [facility].  Our goal is to provide you with an individualized program based on your diagnosis and situation, designed to meet your specific needs.  With this comprehensive rehabilitation program, you will be expected to participate in at least 3 hours of rehabilitation therapies Monday-Friday, with modified therapy programming on the weekends.  Your rehabilitation program will include the following services:  Physical Therapy (PT), Occupational Therapy (OT), 24 hour per day rehabilitation nursing, Therapeutic Recreaction (TR), Neuropsychology, Case Management (Social Worker), Rehabilitation Medicine, Nutrition Services and Pharmacy Services  Weekly team conferences will be held on Wednesday to discuss your progress.  Your Social Worker will talk with you frequently to get your input and to update you on team discussions.  Team conferences with you and your family in attendance may also be held.  Expected length of stay: 10-14 days  Overall anticipated outcome: independent with device  Depending on your progress and recovery, your program may change. Your Social Worker will coordinate services and will keep you informed of any changes. Your Social Worker's name and contact numbers are listed  below.  The following services may also be recommended but are not provided by the [facility] will be made to provide these services after discharge if needed.  Arrangements include referral to agencies that provide these services.  Your insurance has been verified to be:  [employer] Your primary doctor is:  Horald Pollen  Pertinent information will be shared with your doctor and your insurance  company.  Social Worker:  Ovidio Kin, [place] or ([phone]  Information discussed with and copy given to patient by: Elease Hashimoto, 01/16/2019, 11:02 AM

## 2019-01-16 NOTE — Evaluation (Signed)
Occupational Therapy Assessment and Plan  Patient Details  Name: Jeremy Grant MRN: [id number] Date of Birth: 04/19/1961  OT Diagnosis: abnormal posture, acute pain, hemiplegia affecting non-dominant side, lumbago (low back pain) and muscle weakness (generalized) Rehab Potential: Rehab Potential (ACUTE ONLY): Excellent ELOS: 12-14 days   Today's Date: 01/16/2019 OT Individual Time: 1347-1511 OT Individual Time Calculation (min): 84 min     Problem List:  Patient Active Problem List   Diagnosis Date Noted  . 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 Weakness   . CVA (cerebral vascular accident) (HCC) 01/12/2019  . Malnutrition of moderate degree 07/31/2018  . Elevated serum protein level   . Multiple myeloma (HCC)   . Pancytopenia (HCC)   . Normocytic normochromic anemia 07/16/2018  . Acute kidney failure (HCC) 07/16/2018  . NICM (nonischemic cardiomyopathy) (HCC) 11/12/2013  . History of ETOH abuse 11/12/2013  . Chronic combined systolic and diastolic CHF (congestive heart failure) (HCC) 11/12/2013  . Congestive dilated cardiomyopathy (HCC) 11/04/2013  . Malignant hypertension     Past Medical History:  Past Medical History:  Diagnosis Date  . Arthritis   . CHF (congestive heart failure) (HCC)   . Combined systolic and diastolic cardiac dysfunction    Echo 11/03/2013 EF 20%, grade 3 diastolic dysfunction  . Hypertension   . NICM (nonischemic cardiomyopathy) (HCC)    a. L/RHC (11/05/13): RA: 3, RV 52/5, PA 49/19 (31), PCWP 10, AO 166/93, PA 67%, Fick CO/CI: 5.71/2.97, Lmain: normal, LAD: large, without signficant dz, first diagonal has 20% dz at ostium, LCx: normal, RCA: 30% stenosis at the bifurcation of PDA and PLOM   Past Surgical History:  Past Surgical History:  Procedure Laterality Date  . IR FLUORO GUIDE CV LINE RIGHT  07/25/2018  . IR US GUIDE VASC ACCESS RIGHT  07/25/2018  . LEFT AND RIGHT HEART CATHETERIZATION WITH  CORONARY ANGIOGRAM N/A 11/05/2013   Procedure: LEFT AND RIGHT HEART CATHETERIZATION WITH CORONARY ANGIOGRAM;  Surgeon: Peter M Jordan, MD;  Location: [facility] CATH [facility];  Service: Cardiovascular;  Laterality: N/A;  . RIGHT HEART CATHETERIZATION N/A 01/14/2014   Procedure: RIGHT HEART CATH;  Surgeon: Dalton S McLean, MD;  Location: [facility] CATH LAB;  Service: Cardiovascular;  Laterality: N/A;    Assessment & Plan Clinical Impression: Patient is a 58 y.o. year old male with recent admission to the hospital on 01/12/2019 when his left leg became weak and fell without loss of consciousness.  He initially did not come to the hospital.  Hemoglobin 8.8, creatinine 2.95, urine drug screen negative.  MRI of the brain showed patchy area of acute infarction in the deep white matter on the right including the corpus callosum possible watershed type infarct.  Patient did not receive TPA.  MRI lumbar spine showed grade 1 anterior listhesis of L4 on L5 with mild central cord protrusion.  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  Patient transferred to CIR on 01/15/2019 .    Patient currently requires mod with basic self-care skills secondary to muscle weakness, impaired timing and sequencing, unbalanced muscle activation and decreased coordination, decreased attention to left and decreased sitting balance, decreased standing balance, decreased postural control, hemiplegia and decreased balance strategies.  Prior to hospitalization, patient could complete ADLs with independent .  Patient will benefit from skilled intervention to decrease level of assist with basic self-care skills and increase independence with basic self-care skills   prior to discharge home with care partner.  Anticipate patient will require intermittent supervision and follow up outpatient.  OT - End of Session Activity Tolerance: Decreased this  session Endurance Deficit: Yes OT Assessment Rehab Potential (ACUTE ONLY): Excellent OT Barriers to Discharge: Decreased caregiver support OT Barriers to Discharge Comments: spouse needs to work if possible OT Patient demonstrates impairments in the following area(s): Balance;Perception;Motor;Pain;Sensory OT Basic ADL's Functional Problem(s): Grooming;Bathing;Dressing;Toileting OT Advanced ADL's Functional Problem(s): Simple Meal Preparation OT Transfers Functional Problem(s): Tub/Shower;Toilet OT Additional Impairment(s): Fuctional Use of Upper Extremity OT Plan OT Intensity: Minimum of 1-2 x/day, 45 to 90 minutes OT Frequency: 5 out of 7 days OT Duration/Estimated Length of Stay: 12-14 days OT Treatment/Interventions: Balance/vestibular training;Discharge planning;Pain management;Self Care/advanced ADL retraining;Therapeutic Activities;UE/LE Coordination activities;Disease mangement/prevention;Functional mobility training;Patient/family education;Therapeutic Exercise;Community reintegration;DME/adaptive equipment instruction;Neuromuscular re-education;UE/LE Strength taining/ROM;Wheelchair propulsion/positioning OT Self Feeding Anticipated Outcome(s): Independence OT Basic Self-Care Anticipated Outcome(s): Modified independent to supervision OT Toileting Anticipated Outcome(s): modified independent OT Bathroom Transfers Anticipated Outcome(s): supervision to modified independence OT Recommendation Recommendations for Other Services: Therapeutic Recreation consult Therapeutic Recreation Interventions: Stress management Patient destination: Home Follow Up Recommendations: Home health OT Equipment Recommended: To be determined   Skilled Therapeutic Intervention Pt completed selfcare retraining with spouse present during session.  He needed max hand held assist for functional mobility to the walk-in shower.  Decreased ability to efficiently advance the LLE with increased adduction noted and  decreased knee control noted with some hyperextension.  He completed bathing with min questioning cueing and mod assist overall sit to stand.  Mod assist for stand pivot transfer out to the wheelchair in order to work on dressing at the sink.  He completed donning UB clothing with supervision and then needed mod assist for LB dressing.  Decreased ability to keep the LLE crossed over the left knee for donning socks and shoe.  He was then able to complete 69' of functional mobility with use of the RW and overall min facilitation.  Increased trunk and cervical flexion in standing.  Finished session with transfer to the bed and pt left with call button and phone in reach and bed alarm in place.  Pt's spouse also present.   OT Evaluation Precautions/Restrictions  Precautions Precautions: Fall Precaution Comments: LLE paresis more severe than in the LUE Restrictions Weight Bearing Restrictions: No  Pain Pain Assessment Pain Scale: Faces Faces Pain Scale: Hurts a little bit Pain Type: Chronic pain Pain Location: Back Pain Orientation: Lower Pain Descriptors / Indicators: Discomfort Pain Onset: With Activity Pain Intervention(s): Repositioned Home Living/Prior Functioning Home Living Living Arrangements: Spouse/significant other Available Help at Discharge: Available PRN/intermittently, Family Type of Home: House Home Access: Stairs to enter Technical brewer of Steps: 3 Entrance Stairs-Rails: Can reach both Home Layout: One level Bathroom Shower/Tub: Chiropodist: Standard Bathroom Accessibility: Yes  Lives With: Spouse IADL History Homemaking Responsibilities: Yes Meal Prep Responsibility: Secondary Current License: Yes Mode of Transportation: Car Occupation: Full time employment Type of Occupation: 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 Leisure and Hobbies: Likes to fish Prior Function Level of Independence: Independent with  basic ADLs, Independent with gait  Able to Take Stairs?: Yes Driving: Yes Vocation: Full time employment Comments: working at [employer] until March 2020 ADL ADL Eating: Independent Where Assessed-Eating: Wheelchair Grooming: Supervision/safety Where Assessed-Grooming: Sitting at sink Upper Body Bathing: Setup Where Assessed-Upper Body Bathing: Wheelchair Lower Body Bathing: Moderate assistance Where Assessed-Lower Body Bathing: Shower Upper Body Dressing:  Supervision/safety Where Assessed-Upper Body Dressing: Wheelchair Lower Body Dressing: Moderate assistance Where Assessed-Lower Body Dressing: Wheelchair Toileting: Moderate assistance Where Assessed-Toileting: Bedside Commode Toilet Transfer: Moderate assistance Toilet Transfer Method: Stand pivot Science writer: Geophysical data processor: Maximal Firefighter Method: Ambulating(no assistive device) Vision Baseline Vision/History: Wears glasses Wears Glasses: At all times Patient Visual Report: No change from baseline Vision Assessment?: Yes Eye Alignment: Within Functional Limits Ocular Range of Motion: Within Functional Limits Tracking/Visual Pursuits: Able to track stimulus in all quads without difficulty Saccades: Within functional limits Convergence: Within functional limits Visual Fields: No apparent deficits Perception  Perception: Impaired Inattention/Neglect: Does not attend to left side of body Praxis Praxis: Intact Cognition Overall Cognitive Status: Within Functional Limits for tasks assessed Arousal/Alertness: Awake/alert Orientation Level: Person;Place;Situation Person: Oriented Place: Oriented Situation: Oriented Year: 2020 Month: September Day of Week: Correct Memory: Appears intact Immediate Memory Recall: Sock;Blue;Bed Memory Recall Sock: Without Cue Memory Recall Blue: Without Cue Memory Recall Bed: Without Cue Attention: Sustained Awareness:  Appears intact Problem Solving: Appears intact Safety/Judgment: Appears intact Sensation Sensation Light Touch: Impaired Detail Light Touch Impaired Details: Impaired RUE Hot/Cold: Appears Intact Proprioception: Impaired Detail Proprioception Impaired Details: Impaired LLE(Intact in the LUE) Stereognosis: Appears Intact Additional Comments: pt able to detect light touch but inaccurate with precise area being touched on the left arm and hand Coordination Gross Motor Movements are Fluid and Coordinated: No Fine Motor Movements are Fluid and Coordinated: Yes Coordination and Movement Description: Pt with slight gross motor coordination deficits in the LUE but FM coordination WFLS for picking up change one at a times and translating from palm to fingertips and back without difficulty. Finger Nose Finger Test: Slight dysmetria with LUE compared to the RUE Motor  Motor Motor: Hemiplegia;Abnormal postural alignment and control Motor - Skilled Clinical Observations: L hemiplegia (LLE more impaired than LUE) Mobility  Bed Mobility Bed Mobility: Sit to Supine Sit to Supine: Minimal Assistance - Patient > 75% Transfers Sit to Stand: Moderate Assistance - Patient 50-74% Stand to Sit: Moderate Assistance - Patient 50-74%  Trunk/Postural Assessment  Cervical Assessment Cervical Assessment: Exceptions to WFL(cervical protraction present) Thoracic Assessment Thoracic Assessment: Exceptions to WFL(thoracic kyphosis) Lumbar Assessment Lumbar Assessment: Exceptions to WFL(posterior pelvic tilt in sitting)  Balance Balance Balance Assessed: Yes Static Sitting Balance Static Sitting - Balance Support: Feet supported Static Sitting - Level of Assistance: 5: Stand by assistance Dynamic Sitting Balance Dynamic Sitting - Balance Support: During functional activity Dynamic Sitting - Level of Assistance: 3: Mod assist Static Standing Balance Static Standing - Level of Assistance: 4: Min  assist Dynamic Standing Balance Dynamic Standing - Level of Assistance: 2: Max assist Extremity/Trunk Assessment RUE Assessment RUE Assessment: Within Functional Limits LUE Assessment LUE Assessment: Exceptions to [facility] General Strength Comments: AROM WFLS for all joints. shoulder flexion 3+/5 with all other joints 4/5.  Slight dysmetria noted with finger to nose testing but FM coordination appears to be WFLs.     Refer to Care Plan for Long Term Goals  Recommendations for other services: Therapeutic Recreation  Stress management   Discharge Criteria: Patient will be discharged from OT if patient refuses treatment 3 consecutive times without medical reason, if treatment goals not met, if there is a change in medical status, if patient makes no progress towards goals or if patient is discharged from hospital.  The above assessment, treatment plan, treatment alternatives and goals were discussed and mutually agreed upon: by patient and by family  Trevante Tennell OTR/L  01/16/2019, 4:54 PM

## 2019-01-16 NOTE — Progress Notes (Signed)
[facility] PHYSICAL MEDICINE & REHABILITATION PROGRESS NOTE   Subjective/Complaints:  No issues overnite. Wants IV out.  Eating and drinking well  Objective:   No results found. Recent Labs    01/15/19 2011 01/16/19 0511  WBC 3.8* 3.5*  HGB 7.7* 8.3*  HCT 21.6* 23.2*  PLT 112* 109*   Recent Labs    01/15/19 2011 01/16/19 0511  NA  --  143  K  --  4.1  CL  --  112*  CO2  --  21*  GLUCOSE  --  122*  BUN  --  67*  CREATININE 3.17* 3.03*  CALCIUM  --  9.1    Intake/Output Summary (Last 24 hours) at 01/16/2019 0654 Last data filed at 01/16/2019 0500 Gross per 24 hour  Intake -  Output 900 ml  Net -900 ml     Physical Exam: Vital Signs Blood pressure (!) 174/101, pulse 72, temperature 98.2 F (36.8 C), temperature source Oral, resp. rate 18, weight 63.3 kg, SpO2 100 %.  General: No acute distress Mood and affect are appropriate Heart: Regular rate and rhythm no rubs murmurs or extra sounds Lungs: Clear to auscultation, breathing unlabored, no rales or wheezes Abdomen: Positive bowel sounds, soft nontender to palpation, nondistended Extremities: No clubbing, cyanosis, or edema Skin: No evidence of breakdown, no evidence of rash Neurologic: Cranial nerves II through XII intact, motor strength is 5/5 in bilateral deltoid, bicep, tricep, grip,2- Left  hip flexor, knee extensors,0/5 Left  ankle dorsiflexor and plantar flexor, 5/5 RLE Sensory exam normal sensation to light touch and proprioception in bilateral upper and lower extremities Cerebellar exam normal finger to nose to finger as well as heel to shin in bilateral upper and lower extremities Musculoskeletal: Full range of motion in all 4 extremities. No joint swelling    Assessment/Plan: 1. 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  Physiatrist is providing close team supervision and 24 hour  management of active medical problems listed below.  Physiatrist and rehab team continue to assess barriers to discharge/monitor patient progress toward functional and medical goals  Care Tool:  Bathing              Bathing assist       Upper Body Dressing/Undressing Upper body dressing        Upper body assist      Lower Body Dressing/Undressing Lower body dressing            Lower body assist       Toileting Toileting    Toileting assist       Transfers Chair/bed transfer  Transfers assist           Locomotion Ambulation   Ambulation assist              Walk 10 feet activity   Assist           Walk 50 feet activity   Assist           Walk 150 feet activity   Assist           Walk 10 feet on uneven surface  activity   Assist           Wheelchair     Assist               Wheelchair 50 feet with 2 turns activity    Assist  Wheelchair 150 feet activity     Assist          Blood pressure (!) 174/101, pulse 72, temperature 98.2 F (36.8 C), temperature source Oral, resp. rate 18, weight 63.3 kg, SpO2 100 %.    Medical Problem List and Plan: 1.  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  Plan 30-day cardiac event monitor as outpatient CIR evals today  2.  Antithrombotics: -DVT/anticoagulation: Lovenox             -antiplatelet therapy: Aspirin 325 mg, Plavix 75 mg daily x3 weeks then aspirin alone 3. Pain Management: Tylenol as needed 4. Mood: Provide emotional support             -antipsychotic agents: N/A 5. Neuropsych: This patient is capable of making decisions on his own behalf. 6. Skin/Wound Care: Routine skin checks 7. Fluids/Electrolytes/Nutrition: Routine in and outs with follow-up chemistries 8.  Nonischemic cardiomyopathy with combined systolic and  diastolic congestive heart failure.  Monitor for any signs of fluid overload 9.  Hypertension. Permissive, keep DBP <110 , SBP <200 10.  ANCA necrotizing concentric glomerulonephritis.  Continue Cytoxan as well as chronic prednisone.  Follow-up per renal services.  Admission creatinine of 2.95 11.  Pancytopenia/chronic anemia.  Follow-up labs patient had been receiving Aranesp as an outpatient with renal services. 12.  Hyperlipidemia.  Lipitor 13.  History of alcohol use.  Counseling and monitor. 14. Concern for spasticity formation- will con't to monitor- since has elevated DTRs on LLE, is at risk for spasticity development. 15. Constipation- will get back on regular elimination/bowel regimen 16. Dispo- expect 7-10 days, but will determine per team conference  LOS: 1 days A FACE TO [place] 01/16/2019, 6:54 AM

## 2019-01-17 ENCOUNTER — Inpatient Hospital Stay (HOSPITAL_COMMUNITY): Payer: BC Managed Care – PPO

## 2019-01-17 ENCOUNTER — Inpatient Hospital Stay (HOSPITAL_COMMUNITY): Payer: BC Managed Care – PPO | Admitting: Speech Pathology

## 2019-01-17 ENCOUNTER — Inpatient Hospital Stay (HOSPITAL_COMMUNITY): Payer: BC Managed Care – PPO | Admitting: Physical Therapy

## 2019-01-17 NOTE — Progress Notes (Signed)
Physical Therapy Session Note  Patient Details  Name: Jeremy Grant MRN: [id number] Date of Birth: November 17, 1960  Today's Date: 01/17/2019 PT Individual Time: 8295-6213 PT Individual Time Calculation (min): 63 min   Short Term Goals: Week 1:  PT Short Term Goal 1 (Week 1): Pt will be able to perform bed mobility with CGA PT Short Term Goal 2 (Week 1): Pt will be able to perform functional transfers with CGA PT Short Term Goal 3 (Week 1): Pt will be able to gait x 100' with min assist PT Short Term Goal 4 (Week 1): Pt will be able to perform 4 stairs with rails with CGA  Skilled Therapeutic Interventions/Progress Updates:    Pt received supine in bed and agreeable to therapy session. Supine>sit, HOB slightly elevated, with min assist for trunk upright. Stand pivot EOB>w/c, no AD, with mod assist for balance and mod cuing for sequencing. Transported to/from gym in w/c. Ambulated ~5ft using RW with min/mod assist for balance and manual facilitation with multimodal cuing for improved L LE gait mechanics - demonstrates good hip/knee flexion but no ankle DF and increased hip internal rotation and hip adduction during swing phase with cuing to correct these deviations - demonstrates either sustained knee flexion or knee hyperextension during stance phase of gait with manual facilitation to block hyperextension but cuing for sufficient knee extension. Therapist donned ACE wrap for L ankle DF assist during gait. Ambulated ~188ft using RW with min/mod assist for balance and pt demonstrated increased ankle DF during swing phase allowing improved heel strike on initial contact - cuing and manual facilitation to continue addressing the above gait impairments. Donned L knee hyperextension brace then ambulated ~149ft again using RW with min/mod assist for balance and max multimodal cuing and manual facilitation for sequencing of L LE initial heel strike followed by knee/hip extension and L lateral weight shift prior  to stepping forward with R LE. Ambulated ~170ft x2 (seated break between) using RW with L knee hyperextension brace and ACE wrap still donned and pt demonstrated significant improvements in reciprocal stepping pattern, L knee extension during stance, L heel touch on initial contact, and improving gait speed; however, with fatigue still demonstrated above gait deviations. Pre-gait training using RW targeting L LE swing phase gait mechanics to improve heel strike - mirror feedback - min assist for balance and multimodal cuing with manual facilitation for improved hip movement and L LE mechanics. Stand pivot EOM>w/c, no AD, with min/mod assist for balance and mod cuing for sequencing. Transported back to room in w/c. Stand pivot to bed as described above. Sit>supine with CGA for trunk descent. Pt left supine in bed with needs in reach and bed alarm on.  Therapy Documentation Precautions:  Precautions Precautions: Fall Precaution Comments: LLE paresis more severe than in the LUE Restrictions Weight Bearing Restrictions: No  Pain: Denies pain during session.   Therapy/Group: Individual Therapy  Tawana Scale, PT, DPT 01/17/2019, 7:57 AM

## 2019-01-17 NOTE — Progress Notes (Signed)
Occupational Therapy Session Note  Patient Details  Name: Jeremy Grant MRN: [id number] Date of Birth: 12-10-1960  Today's Date: 01/17/2019 OT Individual Time: 8657-8469 OT Individual Time Calculation (min): 60 min    Short Term Goals: Week 1:  OT Short Term Goal 1 (Week 1): Pt will complete toilet transfers with use of the RW and min guard assist. OT Short Term Goal 2 (Week 1): Pt will complete LB bathing with min guard assist sit to stand. OT Short Term Goal 3 (Week 1): Pt will complete LB dressing with min guard assist sit to stand.  Skilled Therapeutic Interventions/Progress Updates:    1:1. Pt received in bed with no c/o pain and declining shower this date. Pt completes supine>sitting EOB with CGA for trunk elevation. Pt completes UB bathing/dressing at EOB with intermittent CGA for sitting balance. Pt requries MIN A for sitting balance at EOB for LB dressing and MIN VC for noticing/responding to threading BLE into same pant leg. MIN A sit to stand at EOB with RW for advancing pants past hips and functional mobility at sink. Pt stands with CGA at sink with tactile cues to L knee for extension in standing during oral care. Pt sits to shave at sink with setup. Pt steers to/from all tx destinations with BUE and RLE with VC for farther reach back/bigger push with LUE. Exited session with pt seated in w/c, call light in reach and all needs met  Box and Blocks  LUE-41 LUE 56  9HPT LUE-34.5 seconds RUE- 21.1  Dynamometer LUE-57, 60, 65 # RUE-70, 70, 75  Therapy Documentation Precautions:  Precautions Precautions: Fall Precaution Comments: LLE paresis more severe than in the LUE Restrictions Weight Bearing Restrictions: No General:   Vital Signs: Therapy Vitals Temp: 98.4 F (36.9 C) Temp Source: Oral Pulse Rate: 75 Resp: 17 BP: (!) 171/99 Patient Position (if appropriate): Lying Oxygen Therapy SpO2: 100 % O2 Device: Room Air Pain:   ADL: ADL Eating:  Independent Where Assessed-Eating: Wheelchair Grooming: Supervision/safety Where Assessed-Grooming: Sitting at sink Upper Body Bathing: Setup Where Assessed-Upper Body Bathing: Wheelchair Lower Body Bathing: Moderate assistance Where Assessed-Lower Body Bathing: Shower Upper Body Dressing: Supervision/safety Where Assessed-Upper Body Dressing: Wheelchair Lower Body Dressing: Moderate assistance Where Assessed-Lower Body Dressing: Wheelchair Toileting: Moderate assistance Where Assessed-Toileting: Bedside Commode Toilet Transfer: Moderate assistance Toilet Transfer Method: Stand pivot Science writer: Geophysical data processor: Maximal Firefighter Method: Ambulating(no assistive device) Vision   Perception    Praxis   Exercises:   Other Treatments:     Therapy/Group: Individual Therapy  Tonny Branch 01/17/2019, 8:41 AM

## 2019-01-17 NOTE — Progress Notes (Signed)
[facility] PHYSICAL MEDICINE & REHABILITATION PROGRESS NOTE   Subjective/Complaints:  NO issue per pt- LBM this AM- still doesn't feel like working "right"- slept OK- wife not here yet.    Objective:   No results found. Recent Labs    01/15/19 2011 01/16/19 0511  WBC 3.8* 3.5*  HGB 7.7* 8.3*  HCT 21.6* 23.2*  PLT 112* 109*   Recent Labs    01/15/19 2011 01/16/19 0511  NA  --  143  K  --  4.1  CL  --  112*  CO2  --  21*  GLUCOSE  --  122*  BUN  --  67*  CREATININE 3.17* 3.03*  CALCIUM  --  9.1    Intake/Output Summary (Last 24 hours) at 01/17/2019 1219 Last data filed at 01/17/2019 3500 Gross per 24 hour  Intake 470 ml  Output 1000 ml  Net -530 ml     Physical Exam: Vital Signs Blood pressure (!) 171/99, pulse 75, temperature 98.4 F (36.9 C), temperature source Oral, resp. rate 17, weight 63.9 kg, SpO2 100 %.  General: No acute distress Standing up in front of sink, brushing teeth, OT at side, NAD, working on using LLE and putting weight through it Mood and affect are appropriate Heart: Regular rate and rhythm no rubs murmurs or extra sounds Lungs: Clear to auscultation, breathing unlabored, no rales or wheezes Abdomen: Positive bowel sounds, soft nontender to palpation, nondistended Extremities: No clubbing, cyanosis, or edema Skin: No evidence of breakdown, no evidence of rash Neurologic: Cranial nerves II through XII intact, motor strength is 5/5 in bilateral deltoid, bicep, tricep, grip,2- Left  hip flexor, knee extensors,0/5 Left  ankle dorsiflexor and plantar flexor, 5/5 RLE Sensory exam normal sensation to light touch and proprioception in bilateral upper and lower extremities Cerebellar exam normal finger to nose to finger as well as heel to shin in bilateral upper and lower extremities Musculoskeletal: Full range of motion in all 4 extremities. No joint swelling    Assessment/Plan: 1. Functional deficits secondary to RIght ACA infarct  which  require 3+ hours per day of interdisciplinary therapy in a comprehensive inpatient rehab setting.  Physiatrist is providing close team supervision and 24 hour management of active medical problems listed below.  Physiatrist and rehab team continue to assess barriers to discharge/monitor patient progress toward functional and medical goals  Care Tool:  Bathing    Body parts bathed by patient: Right arm, Left arm, Chest, Abdomen, Right upper leg, Left upper leg, Right lower leg, Left lower leg, Face   Body parts bathed by helper: Front perineal area, Buttocks     Bathing assist Assist Level: Moderate Assistance - Patient 50 - 74%     Upper Body Dressing/Undressing Upper body dressing   What is the patient wearing?: Pull over shirt    Upper body assist Assist Level: Set up assist    Lower Body Dressing/Undressing Lower body dressing      What is the patient wearing?: Pants     Lower body assist Assist for lower body dressing: Moderate Assistance - Patient 50 - 74%     Toileting Toileting    Toileting assist Assist for toileting: Independent with assistive device Assistive Device Comment: indep. w/ urinal in bed   Transfers Chair/bed transfer  Transfers assist     Chair/bed transfer assist level: Minimal Assistance - Patient > 75%     Locomotion Ambulation   Ambulation assist      Assist level: Maximal Assistance -  Patient 25 - 49% Assistive device: No Device Max distance: 15'   Walk 10 feet activity   Assist     Assist level: Maximal Assistance - Patient 25 - 49% Assistive device: No Device   Walk 50 feet activity   Assist    Assist level: Maximal Assistance - Patient 25 - 49% Assistive device: No Device    Walk 150 feet activity   Assist Walk 150 feet activity did not occur: Safety/medical concerns         Walk 10 feet on uneven surface  activity   Assist Walk 10 feet on uneven surfaces activity did not occur: Safety/medical  concerns         Wheelchair     Assist Will patient use wheelchair at discharge?: No Type of Wheelchair: Manual    Wheelchair assist level: Supervision/Verbal cueing Max wheelchair distance: 100 ft    Wheelchair 50 feet with 2 turns activity    Assist            Wheelchair 150 feet activity     Assist          Blood pressure (!) 171/99, pulse 75, temperature 98.4 F (36.9 C), temperature source Oral, resp. rate 17, weight 63.9 kg, SpO2 100 %.    Medical Problem List and Plan: 1.  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  Plan 30-day cardiac event monitor as outpatient CIR evals today  2.  Antithrombotics: -DVT/anticoagulation: Lovenox             -antiplatelet therapy: Aspirin 325 mg, Plavix 75 mg daily x3 weeks then aspirin alone 3. Pain Management: Tylenol as needed 4. Mood: Provide emotional support             -antipsychotic agents: N/A 5. Neuropsych: This patient is capable of making decisions on his own behalf. 6. Skin/Wound Care: Routine skin checks 7. Fluids/Electrolytes/Nutrition: Routine in and outs with follow-up chemistries 8.  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  Monitor for any signs of fluid overload 9.  Hypertension. Permissive, keep DBP <110 , SBP <200  9/5- BP 1701/99- will con't to monitor- permissive still 10.  ANCA necrotizing concentric glomerulonephritis.  Continue Cytoxan as well as chronic prednisone.  Follow-up per renal services.  Admission creatinine of 2.95 11.  Pancytopenia/chronic anemia.  Follow-up labs patient had been receiving Aranesp as an outpatient with renal services. 12.  Hyperlipidemia.  Lipitor 13.  History of alcohol use.  Counseling and monitor. 14. Concern for spasticity formation- will con't to monitor- since has elevated DTRs on LLE, is at risk for  spasticity development. 15. Constipation- will get back on regular elimination/bowel regimen 16. Dispo- expect 7-10 days, but will determine per team conference  LOS: 2 days A FACE TO [place] 01/17/2019, 12:19 PM

## 2019-01-17 NOTE — Progress Notes (Addendum)
Physical Therapy Session Note  Patient Details  Name: Jeremy Grant MRN: [id number] Date of Birth: 02-07-1961  Today's Date: 01/17/2019 PT Individual Time: 3474-2595 and 6387-5643 PT Individual Time Calculation (min): 30 min and 31 min  Short Term Goals: Week 1:  PT Short Term Goal 1 (Week 1): Pt will be able to perform bed mobility with CGA PT Short Term Goal 2 (Week 1): Pt will be able to perform functional transfers with CGA PT Short Term Goal 3 (Week 1): Pt will be able to gait x 100' with min assist PT Short Term Goal 4 (Week 1): Pt will be able to perform 4 stairs with rails with CGA  Skilled Therapeutic Interventions/Progress Updates:  Treatment 1: Pt received in bed & agreeable to tx, no c/o pain reported during session. Pt transfers supine>sitting EOB with bed rails, extra time and UE assisting LLE to EOB with CGA overall. Assisted pt with donning shoes for time management and pt transfers bed>w/c with RW & min assist via gait ~3 ft. Pt propels w/c room>nurses station with BUE & RLE before requiring rest break 2/2 fatigue. In gym pt ambulates ~5 ft x 2 w/c<>mat table with RW & min assist with impaired coordination & placement of LLE with buckling in LLE knee noted 1 time. From EOM pt performed sit<>stand with RLE elevated on 3" step with focus on LLE strengthening & NMR and upright posture in standing. Pt performed 3" step taps with BUE support on RW with task focusing on LLE strengthening & coordination for NMR with pt demonstrating significantly impaired strength in L hip flexors with therapist also providing cuing for wide BOS. At end of session pt assisted into recliner & left with chair alarm donned & all needs in reach.   Treatment 2: Pt received in recliner & agreeable to tx, no c/o pain reported. Pt transfers recliner>w/c via stand pivot with RW & min assist with cuing for technique for safety. Transported pt to gym via w/c dependent assist for time management. Pt performs step  ups on 3" step with BUE support on rails ascending leading with LLE, descending leading with RLE with task focusing on LLE strengthening & NMR with pt requiring assistance for LLE placement on step and blocking at knee due to A/P instability (pt's LLE more likely to buckle but pt does experience some L knee hyperextension at times). Pt ambulates 69 ft with RW & min assist with ability to advance RLE but poor neuromuscular control & intermittently circumducts LLE to advance it 2/2 L hip weakness. Pt with impaired heel strike & toe off LLE & demonstrates step to pattern leading with LLE. Pt also maintains L knee flexion throughout gait. Pt transfers w/c<>nu-step with RW & min assist. Pt uses nu-step on level 1 x 4 minutes, attempting with BLE but pt unable to generate enough force in LLE to pedal so pt utilized LUE as needed to help initiate movement. Pt returned to room and transferred w/c>bed with RW & min assist and sit>supine with supervision with BUE assisting LLE onto bed. Pt left in bed with alarm set & needs at hand.   Therapy Documentation Precautions:  Precautions Precautions: Fall Precaution Comments: LLE paresis more severe than in the LUE Restrictions Weight Bearing Restrictions: No    Therapy/Group: Individual Therapy  Waunita Schooner 01/17/2019, 1:37 PM

## 2019-01-18 MED ORDER — ONDANSETRON HCL 4 MG PO TABS
4.0000 mg | ORAL_TABLET | Freq: Three times a day (TID) | ORAL | Status: DC | PRN
  Administered 2019-01-18: 11:00:00 4 mg via ORAL
  Filled 2019-01-18: qty 1

## 2019-01-18 NOTE — IPOC Note (Signed)
Overall Plan of Care [facility]) Patient Details Name: Jeremy Grant MRN: [id number] DOB: 1961/01/30  Admitting Diagnosis: Acute unilateral cerebral infarction in a watershed distribution [facility])  Hospital Problems: Principal Problem:   Acute unilateral cerebral infarction in a watershed distribution [facility]) Active Problems:   Malignant hypertension   Chronic combined systolic and diastolic CHF (congestive heart failure) ([place])   CVA (cerebral vascular accident) ([place])     Functional Problem List: Nursing Endurance, Motor, Pain, Safety, Skin Integrity  PT Balance, Endurance, Motor, Safety, Sensory, Skin Integrity, Perception  OT Balance, Perception, Motor, Pain, Sensory  SLP    TR         Basic ADL's: OT Grooming, Bathing, Dressing, Toileting     Advanced  ADL's: OT Simple Meal Preparation     Transfers: PT Bed to Chair, Bed Mobility, Car, Furniture, Floor  OT Tub/Shower, Toilet     Locomotion: PT Ambulation, Stairs     Additional Impairments: OT Fuctional Use of Upper Extremity  SLP        TR      Anticipated Outcomes Item Anticipated Outcome  Self Feeding Independence  Swallowing      Basic self-care  Modified independent to supervision  Toileting  modified independent   Bathroom Transfers supervision to modified independence  Bowel/Bladder  Pt will remain continent of bowel and bladder with minimal assistance  Transfers  mod I basic transfers; supervision car  Locomotion  mod I household gait; supervision stairs  Communication     Cognition     Pain  Pain score is less than or equal to 4/10  Safety/Judgment  Pt will remain free from falls/injury while in rehab   Therapy Plan: PT Intensity: Minimum of 1-2 x/day ,45 to 90 minutes PT Frequency: 5 out of 7 days PT Duration Estimated Length of Stay: 10-14 days OT Intensity: Minimum of 1-2 x/day, 45 to 90 minutes OT Frequency: 5 out of 7 days OT Duration/Estimated Length of Stay: 12-14 days     Due  to the current state of emergency, patients may not be receiving their 3-hours of Medicare-mandated therapy.   Team Interventions: Nursing Interventions Patient/Family Education, Bladder Management, Bowel Management, Pain Management, Medication Management, Skin Care/Wound Management, Psychosocial Support  PT interventions Ambulation/gait training, Training and development officer, Community reintegration, Discharge planning, Disease management/prevention, DME/adaptive equipment instruction, Functional electrical stimulation, Functional mobility training, Neuromuscular re-education, Pain management, Patient/family education, Psychosocial support, Skin care/wound management, Splinting/orthotics, Stair training, Therapeutic Activities, Therapeutic Exercise, UE/LE Strength taining/ROM, UE/LE Coordination activities, Wheelchair propulsion/positioning, Visual/perceptual remediation/compensation  OT Interventions Balance/vestibular training, Discharge planning, Pain management, Self Care/advanced ADL retraining, Therapeutic Activities, UE/LE Coordination activities, Disease mangement/prevention, Functional mobility training, Patient/family education, Therapeutic Exercise, Community reintegration, Engineer, drilling, Neuromuscular re-education, UE/LE Strength taining/ROM, Wheelchair propulsion/positioning  SLP Interventions    TR Interventions    SW/CM Interventions Discharge Planning, Psychosocial Support, Patient/Family Education   Barriers to Discharge MD  Medical stability, Lack of/limited family support, Pending chemo/radiation and renal issues and nausea  Nursing      PT Decreased caregiver support pt reports wife will be able to provide intermittent assist due to work schedule  OT Decreased caregiver support spouse needs to work if possible  SLP      SW Lack of/limited family support Wife works during the day pt will need to be mod/i to be home alone   Team Discharge  Planning: Destination: PT-Home ,OT- Home , SLP-  Projected Follow-up: PT-Home health PT, [place], [place]  Needs: PT-Rolling walker with 5" wheels, OT- To be determined, SLP-  Equipment Details: PT-pt has rollator and SPC, OT-  Patient/family involved in discharge planning: PT- Patient,  OT-Patient, Family member/caregiver, SLP-   MD ELOS: 12-14 days Medical Rehab Prognosis:  Excellent Assessment: Pt is a 58 yr old male with hx of ANCA necrotizing concentric glumerulonephritis on Cytoxan and Prednisone admitted with R ACA territory CVA that was embolic- we are allowing permissive HTN for 7 days then getting better control; also has pancytopenia, nonischemic cardiomyopathy and combines S/D heart failure and nausea currently-   Goals are Supervision to mod I  See Team Conference Notes for weekly updates to the plan of care

## 2019-01-18 NOTE — Progress Notes (Signed)
PHYSICAL MEDICINE & REHABILITATION PROGRESS NOTE   Subjective/Complaints:  Pt reports stomach /guts are "messed up"- had diarrhea yesterday AM and this AM- only those 2 times, but not sure why- thought could be due to [place] over meds- that med is most likely- Agreed to try some Zofran prn. Ordered.    Objective:   No results found. Recent Labs    01/15/19 2011 01/16/19 0511  WBC 3.8* 3.5*  HGB 7.7* 8.3*  HCT 21.6* 23.2*  PLT 112* 109*   Recent Labs    01/15/19 2011 01/16/19 0511  NA  --  143  K  --  4.1  CL  --  112*  CO2  --  21*  GLUCOSE  --  122*  BUN  --  67*  CREATININE 3.17* 3.03*  CALCIUM  --  9.1    Intake/Output Summary (Last 24 hours) at 01/18/2019 1000 Last data filed at 01/18/2019 0810 Gross per 24 hour  Intake 1162 ml  Output 1425 ml  Net -263 ml     Physical Exam: Vital Signs Blood pressure (!) 170/97, pulse 79, temperature 98 F (36.7 C), temperature source Oral, resp. rate 16, weight 61.3 kg, SpO2 100 %.  General: No acute distress Sitting up in manual w/c in room, watching TV, appropriate, hand on stomach intermittently, NAD Mood and affect are appropriate Heart: Regular rate and rhythm  Lungs: Clear to auscultation B/L Abdomen: hyperactive bowel sounds, soft, nontender to palpation, nondistended Extremities: No clubbing, cyanosis, or edema Skin: No evidence of breakdown, no evidence of rash Neurologic: Cranial nerves II through XII intact, motor strength is 5/5 in bilateral deltoid, bicep, tricep, grip,2- Left  hip flexor, knee extensors,0/5 Left  ankle dorsiflexor and plantar flexor, 5/5 RLE Sensory exam normal sensation to light touch and proprioception in bilateral upper and lower extremities Cerebellar exam normal finger to nose to finger as well as heel to shin in bilateral upper and lower extremities Musculoskeletal: Full range of motion in all 4 extremities. No joint swelling    Assessment/Plan: 1. Functional  deficits secondary to RIght ACA infarct  which require 3+ hours per day of interdisciplinary therapy in a comprehensive inpatient rehab setting.  Physiatrist is providing close team supervision and 24 hour management of active medical problems listed below.  Physiatrist and rehab team continue to assess barriers to discharge/monitor patient progress toward functional and medical goals  Care Tool:  Bathing    Body parts bathed by patient: Right arm, Left arm, Chest, Abdomen, Right upper leg, Left upper leg, Right lower leg, Left lower leg, Face   Body parts bathed by helper: Front perineal area, Buttocks     Bathing assist Assist Level: Moderate Assistance - Patient 50 - 74%     Upper Body Dressing/Undressing Upper body dressing   What is the patient wearing?: Pull over shirt    Upper body assist Assist Level: Set up assist    Lower Body Dressing/Undressing Lower body dressing      What is the patient wearing?: Pants     Lower body assist Assist for lower body dressing: Moderate Assistance - Patient 50 - 74%     Toileting Toileting    Toileting assist Assist for toileting: Independent with assistive device Assistive Device Comment: urinal   Transfers Chair/bed transfer  Transfers assist     Chair/bed transfer assist level: Moderate Assistance - Patient 50 - 74%     Locomotion Ambulation   Ambulation assist  Assist level: Moderate Assistance - Patient 50 - 74% Assistive device: Walker-rolling Max distance: 162ft   Walk 10 feet activity   Assist     Assist level: Moderate Assistance - Patient - 50 - 74% Assistive device: Walker-rolling   Walk 50 feet activity   Assist    Assist level: Moderate Assistance - Patient - 50 - 74% Assistive device: Walker-rolling    Walk 150 feet activity   Assist Walk 150 feet activity did not occur: Safety/medical concerns         Walk 10 feet on uneven surface  activity   Assist Walk 10 feet on  uneven surfaces activity did not occur: Safety/medical concerns         Wheelchair     Assist Will patient use wheelchair at discharge?: No Type of Wheelchair: Manual    Wheelchair assist level: Supervision/Verbal cueing Max wheelchair distance: 100 ft    Wheelchair 50 feet with 2 turns activity    Assist            Wheelchair 150 feet activity     Assist          Blood pressure (!) 170/97, pulse 79, temperature 98 F (36.7 C), temperature source Oral, resp. rate 16, weight 61.3 kg, SpO2 100 %.    Medical Problem List and Plan: 1.  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  Plan 30-day cardiac event monitor as outpatient CIR evals today  2.  Antithrombotics: -DVT/anticoagulation: Lovenox             -antiplatelet therapy: Aspirin 325 mg, Plavix 75 mg daily x3 weeks then aspirin alone 3. Pain Management: Tylenol as needed 4. Mood: Provide emotional support             -antipsychotic agents: N/A 5. Neuropsych: This patient is capable of making decisions on his own behalf. 6. Skin/Wound Care: Routine skin checks 7. Fluids/Electrolytes/Nutrition: Routine in and outs with follow-up chemistries 8.  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  Monitor for any signs of fluid overload 9.  Hypertension. Permissive, keep DBP <110 , SBP <200  9/5- BP 1701/99- will con't to monitor- permissive still  9/6- BP 170s/90s 10.  ANCA necrotizing concentric glomerulonephritis.  Continue Cytoxan as well as chronic prednisone.  Follow-up per renal services.  Admission creatinine of 2.95 11.  Pancytopenia/chronic anemia.  Follow-up labs patient had been receiving Aranesp as an outpatient with renal services. 12.  Hyperlipidemia.  Lipitor 13.  History of alcohol use.  Counseling and monitor. 14. Concern for spasticity formation-  will con't to monitor- since has elevated DTRs on LLE, is at risk for spasticity development. 15. Diarrhea/Bowel issues/Nausea  9/6- will give Zofran prn 4 mg q8 hours prn and monitor 16. Dispo- expect 7-10 days, but will determine per team conference  LOS: 3 days A FACE TO [place] 01/18/2019, 10:00 AM

## 2019-01-19 ENCOUNTER — Inpatient Hospital Stay (HOSPITAL_COMMUNITY): Payer: BC Managed Care – PPO | Admitting: Occupational Therapy

## 2019-01-19 ENCOUNTER — Inpatient Hospital Stay (HOSPITAL_COMMUNITY): Payer: BC Managed Care – PPO | Admitting: Physical Therapy

## 2019-01-19 ENCOUNTER — Other Ambulatory Visit: Payer: Self-pay

## 2019-01-19 LAB — CBC WITH DIFFERENTIAL/PLATELET
Abs Immature Granulocytes: 0.02 10*3/uL (ref 0.00–0.07)
Basophils Absolute: 0 10*3/uL (ref 0.0–0.1)
Basophils Relative: 1 %
Eosinophils Absolute: 0.1 10*3/uL (ref 0.0–0.5)
Eosinophils Relative: 2 %
HCT: 22 % — ABNORMAL LOW (ref 39.0–52.0)
Hemoglobin: 7.8 g/dL — ABNORMAL LOW (ref 13.0–17.0)
Immature Granulocytes: 1 %
Lymphocytes Relative: 13 %
Lymphs Abs: 0.5 10*3/uL — ABNORMAL LOW (ref 0.7–4.0)
MCH: 35.5 pg — ABNORMAL HIGH (ref 26.0–34.0)
MCHC: 35.5 g/dL (ref 30.0–36.0)
MCV: 100 fL (ref 80.0–100.0)
Monocytes Absolute: 0.5 10*3/uL (ref 0.1–1.0)
Monocytes Relative: 13 %
Neutro Abs: 2.6 10*3/uL (ref 1.7–7.7)
Neutrophils Relative %: 70 %
Platelets: 107 10*3/uL — ABNORMAL LOW (ref 150–400)
RBC: 2.2 MIL/uL — ABNORMAL LOW (ref 4.22–5.81)
RDW: 13.3 % (ref 11.5–15.5)
WBC: 3.6 10*3/uL — ABNORMAL LOW (ref 4.0–10.5)
nRBC: 0 % (ref 0.0–0.2)

## 2019-01-19 LAB — BASIC METABOLIC PANEL
Anion gap: 7 (ref 5–15)
BUN: 66 mg/dL — ABNORMAL HIGH (ref 6–20)
CO2: 23 mmol/L (ref 22–32)
Calcium: 9 mg/dL (ref 8.9–10.3)
Chloride: 113 mmol/L — ABNORMAL HIGH (ref 98–111)
Creatinine, Ser: 2.82 mg/dL — ABNORMAL HIGH (ref 0.61–1.24)
GFR calc Af Amer: 27 mL/min — ABNORMAL LOW (ref >60)
GFR calc non Af Amer: 23 mL/min — ABNORMAL LOW (ref >60)
Glucose, Bld: 123 mg/dL — ABNORMAL HIGH (ref 70–99)
Potassium: 4 mmol/L (ref 3.5–5.1)
Sodium: 143 mmol/L (ref 135–145)

## 2019-01-19 MED ORDER — AMLODIPINE BESYLATE 2.5 MG PO TABS
2.5000 mg | ORAL_TABLET | Freq: Every day | ORAL | Status: DC
  Administered 2019-01-19 – 2019-01-22 (×4): 2.5 mg via ORAL
  Filled 2019-01-19 (×4): qty 1

## 2019-01-19 MED ORDER — TIZANIDINE HCL 2 MG PO TABS
2.0000 mg | ORAL_TABLET | Freq: Every day | ORAL | Status: DC
  Administered 2019-01-19 – 2019-02-03 (×16): 2 mg via ORAL
  Filled 2019-01-19 (×16): qty 1

## 2019-01-19 NOTE — Progress Notes (Signed)
Occupational Therapy Session Note  Patient Details  Name: Jeremy Grant MRN: [id number] Date of Birth: 05/28/1960  Today's Date: 01/19/2019 OT Individual Time: 1410-1505 OT Individual Time Calculation (min): 55 min    Short Term Goals: Week 1:  OT Short Term Goal 1 (Week 1): Pt will complete toilet transfers with use of the RW and min guard assist. OT Short Term Goal 2 (Week 1): Pt will complete LB bathing with min guard assist sit to stand. OT Short Term Goal 3 (Week 1): Pt will complete LB dressing with min guard assist sit to stand.  Skilled Therapeutic Interventions/Progress Updates:    1:1 Focused on scapular stabilization and shoulder strengthening exercises including : climbing the finger ladder, wall push ups, wall planks, etc  Sit <> stands 10x without UE support focusing on forward weight shift and maintaining balance - needed min to mod A to perform  Practiced donning and doffing AFO with shoe with instructional   cues with supervision.  Performed functional ambulation with AFO and RW ~60 feet weaving in and out of cones- focusing on turning and control of left LE. Pt required min A and mod cues for terminal extension of knee and hip. Pt at times would forget to advance his left LE loosing his balance forward requiring min A to stop and regain balance.,  Ambulated back to his room to the bathroom with RW with min A . Pt able to perform toileting with min A for balance during 3 steps. Returned to bed with min A.   Therapy Documentation Precautions:  Precautions Precautions: Fall Precaution Comments: LLE paresis more severe than in the LUE Restrictions Weight Bearing Restrictions: No General:   Vital Signs: Therapy Vitals Temp: 98.9 F (37.2 C) Temp Source: Oral Pulse Rate: 94 Resp: 19 BP: (!) 160/93 Patient Position (if appropriate): Lying Oxygen Therapy SpO2: 97 % O2 Device: Room Air Pain: No c/o pain   Therapy/Group: Individual Therapy  Willeen Cass  [facility] 01/19/2019, 3:39 PM

## 2019-01-19 NOTE — Progress Notes (Signed)
Pt c/o of muscle spasms in the LLE, and keeping him awake at night. Pt agreed to taking something to help with the spasms. Pt denies pain. Provider will be notified during morning rounds.

## 2019-01-19 NOTE — Progress Notes (Signed)
Occupational Therapy Session Note  Patient Details  Name: Jeremy Grant MRN: [id number] Date of Birth: August 19, 1960  Today's Date: 01/19/2019 OT Individual Time: 7903-8333 OT Individual Time Calculation (min): 76 min    Short Term Goals: Week 1:  OT Short Term Goal 1 (Week 1): Pt will complete toilet transfers with use of the RW and min guard assist. OT Short Term Goal 2 (Week 1): Pt will complete LB bathing with min guard assist sit to stand. OT Short Term Goal 3 (Week 1): Pt will complete LB dressing with min guard assist sit to stand.  Skilled Therapeutic Interventions/Progress Updates:    Pt began session working on bathing, dressing, and grooming tasks.  Min assist for functional ambulation to the shower with AFO and shoes on.  He then removed all clothing sit to stand with min assist.  Once episode of loosing his balance to the left when attempting to remove pants from the LLE.  He completed bathing sit to stand with min assist and min instructional cueing.  Min assist for functional mobility back out to the wheelchair with use of the walker and no AFO this time. He then completed grooming tasks at the sink from the wheelchair with supervision for shaving and modified independent for brushing his teeth and combing his hair.  He then progressed to dressing with supervision for UB and min assist for LB sit to stand.  Increased posterior LOB in standing when attempting to pull items over hips without UE support.  Next, took pt down to the tubshower room with use of the wheelchair and pt propelling himself with supervision.  Educated him on use of the tub bench for transfer into and out of the shower with overall min assist. He then worked on standing balance and LUE functional reaching with use of the [employer].  Mod facilitation to maintain left knee extension while standing with average reaction time at 1.62 seconds using the left hand.  Therapist completed time trial of 1 min with intervals of  1.75 seconds between lights and he scored 29 out of 42.  Returned to the room at the end of the session in wheelchair.  Pt left with spouse in room and call button and phone in reach.  Safety belt in place as well.   Therapy Documentation Precautions:  Precautions Precautions: Fall Precaution Comments: LLE paresis more severe than in the LUE Restrictions Weight Bearing Restrictions: No  Pain: Pain Assessment Pain Scale: Faces Pain Score: 0-No pain ADL: See Care tool section for some details of ADL  Therapy/Group: Individual Therapy  Maalik Pinn OTR/L 01/19/2019, 12:11 PM

## 2019-01-19 NOTE — Progress Notes (Signed)
[place] PHYSICAL MEDICINE & REHABILITATION PROGRESS NOTE   Subjective/Complaints:  Working hard in therapy , having LLE spasms at night   ROS- denies CP, SOB, N/V/D Objective:   No results found. Recent Labs    01/19/19 0508  WBC 3.6*  HGB 7.8*  HCT 22.0*  PLT 107*   Recent Labs    01/19/19 0508  NA 143  K 4.0  CL 113*  CO2 23  GLUCOSE 123*  BUN 66*  CREATININE 2.82*  CALCIUM 9.0    Intake/Output Summary (Last 24 hours) at 01/19/2019 0839 Last data filed at 01/19/2019 0525 Gross per 24 hour  Intake 444 ml  Output 1050 ml  Net -606 ml     Physical Exam: Vital Signs Blood pressure (!) 176/94, pulse 71, temperature 98.2 F (36.8 C), temperature source Oral, resp. rate 16, weight 62.6 kg, SpO2 100 %.  General: No acute distress Sitting up in manual w/c in room, watching TV, appropriate, hand on stomach intermittently, NAD Mood and affect are appropriate Heart: Irreg Irreg  Lungs: Clear to auscultation B/L Abdomen: hyperactive bowel sounds, soft, nontender to palpation, nondistended Extremities: No clubbing, cyanosis, or edema Skin: No evidence of breakdown, no evidence of rash Neurologic: Cranial nerves II through XII intact, motor strength is 5/5 in bilateral deltoid, bicep, tricep, grip,2- Left  hip flexor, knee extensors,0/5 Left  ankle dorsiflexor and plantar flexor, 5/5 RLE Sensory exam normal sensation to light touch and proprioception in bilateral upper and lower extremities Cerebellar exam normal finger to nose to finger as well as heel to shin in bilateral upper and lower extremities TOne MAS 2 at Left hamstrings Musculoskeletal: Full range of motion in all 4 extremities. No joint swelling    Assessment/Plan: 1. 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  Physiatrist is providing close team supervision and 24 hour management of active  medical problems listed below.  Physiatrist and rehab team continue to assess barriers to discharge/monitor patient progress toward functional and medical goals  Care Tool:  Bathing    Body parts bathed by patient: Right arm, Left arm, Chest, Abdomen, Right upper leg, Left upper leg, Right lower leg, Left lower leg, Face   Body parts bathed by helper: Front perineal area, Buttocks     Bathing assist Assist Level: Moderate Assistance - Patient 50 - 74%     Upper Body Dressing/Undressing Upper body dressing   What is the patient wearing?: Pull over shirt    Upper body assist Assist Level: Set up assist    Lower Body Dressing/Undressing Lower body dressing      What is the patient wearing?: Pants     Lower body assist Assist for lower body dressing: Moderate Assistance - Patient 50 - 74%     Toileting Toileting    Toileting assist Assist for toileting: Independent with assistive device Assistive Device Comment: urinal   Transfers Chair/bed transfer  Transfers assist     Chair/bed transfer assist level: Moderate Assistance - Patient 50 - 74%     Locomotion Ambulation   Ambulation assist      Assist level: Moderate Assistance - Patient 50 - 74% Assistive device: Walker-rolling Max distance: 162ft   Walk 10 feet activity   Assist     Assist level: Moderate Assistance - Patient - 50 - 74% Assistive device: Walker-rolling   Walk 50 feet activity   Assist    Assist  level: Moderate Assistance - Patient - 50 - 74% Assistive device: Walker-rolling    Walk 150 feet activity   Assist Walk 150 feet activity did not occur: Safety/medical concerns         Walk 10 feet on uneven surface  activity   Assist Walk 10 feet on uneven surfaces activity did not occur: Safety/medical concerns         Wheelchair     Assist Will patient use wheelchair at discharge?: No Type of Wheelchair: Manual    Wheelchair assist level: Supervision/Verbal  cueing Max wheelchair distance: 100 ft    Wheelchair 50 feet with 2 turns activity    Assist            Wheelchair 150 feet activity     Assist          Blood pressure (!) 176/94, pulse 71, temperature 98.2 F (36.8 C), temperature source Oral, resp. rate 16, weight 62.6 kg, SpO2 100 %.    Medical Problem List and Plan: 1.  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  Plan 30-day cardiac event monitor as outpatient CIR PT, OT 2.  Antithrombotics: -DVT/anticoagulation: Lovenox             -antiplatelet therapy: Aspirin 325 mg, Plavix 75 mg daily x3 weeks then aspirin alone 3. Pain Management: Tylenol as needed 4. Mood: Provide emotional support             -antipsychotic agents: N/A 5. Neuropsych: This patient is capable of making decisions on his own behalf. 6. Skin/Wound Care: Routine skin checks 7. Fluids/Electrolytes/Nutrition: Routine in and outs with follow-up chemistries 8.  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  Monitor for any signs of fluid overload 9.  Hypertension. Permissive, keep DBP <110 , SBP <200  Vitals:   01/18/19 2023 01/19/19 0525  BP: (!) 134/92 (!) 176/94  Pulse: 76 71  Resp: 18 16  Temp: 98.7 F (37.1 C) 98.2 F (36.8 C)  SpO2: 98% 100%  add amlodipine  10.  ANCA necrotizing concentric glomerulonephritis.  Continue Cytoxan as well as chronic prednisone.  Follow-up per renal services.  Admission creatinine of 2.95 11.  Pancytopenia/chronic anemia.  Follow-up labs patient had been receiving Aranesp as an outpatient with renal services. 12.  Hyperlipidemia.  Lipitor 13.  History of alcohol use.  Counseling and monitor. 14. Concern for spasticity formation- will con't to monitor- since has elevated DTRs on LLE, is at risk for spasticity development. 15. Diarrhea/Bowel  issues/Nausea  9/6- will give Zofran prn 4 mg q8 hours prn and monitor 16. Dispo- expect 7-10 days, but will determine per team conference 17.  Flexor spasms LLE add tizanidine qhs 18.  Cardiac arrythmia recheck EKG, last tracing showed freq PAC LOS: 4 days A FACE TO [place] [facility]  01/19/2019, 8:39 AM

## 2019-01-19 NOTE — Progress Notes (Signed)
Physical Therapy Session Note  Patient Details  Name: Jeremy Grant MRN: [id number] Date of Birth: 05/25/1960  Today's Date: 01/19/2019 PT Individual Time: 0803-0900 PT Individual Time Calculation (min): 57 min   Short Term Goals: Week 1:  PT Short Term Goal 1 (Week 1): Pt will be able to perform bed mobility with CGA PT Short Term Goal 2 (Week 1): Pt will be able to perform functional transfers with CGA PT Short Term Goal 3 (Week 1): Pt will be able to gait x 100' with min assist PT Short Term Goal 4 (Week 1): Pt will be able to perform 4 stairs with rails with CGA  Skilled Therapeutic Interventions/Progress Updates: Pt presented in bed agreeable to therapy. Pt denies pain at rest, states some stomach discomfort past few days but feels ok at present. Performed bed mobility supervision with increased time and use of bed features. PTA donned shoes for time management. Pt ambulated to rehab gym with x 1 standing rest break. Pt noted to have more control of L knee with shorter steps which pt was able to perform with mod verbal cues. In gym PTA placed AFO on L foot and ambulated additional 25ft with similar effect. Participated in toe taps to target with pt able to not crush cones and with increased repetitions pt was able to coordinate to appropriate cones. Pt performed STS with RLE on 4 in step to increase LLE ms recruitment and no AD x 5. Pt also performed step ups to 2in step with LLE. Instructed pt in hamstring stretch as pt indicated to MD during assessment that has ms spasms most notably at night and hamstring tightness noted. Performed seated hamstring stretch LLE 1 min x 2. Performed stand pivot to return to w/c and participated in w/c propulsion x 100ft using BUE. Pt transported remaining distance due to tech awaiting to perform EKG on pt. Pt remained in w/c at end of session with belt alarm on, call bell within reach and needs met.      Therapy Documentation Precautions:   Precautions Precautions: Fall Precaution Comments: LLE paresis more severe than in the LUE Restrictions Weight Bearing Restrictions: No General:   Vital Signs: Therapy Vitals BP: (!) 143/97 Pain: Pain Assessment Pain Scale: Faces Pain Score: 0-No pain   Therapy/Group: Individual Therapy      , PTA  01/19/2019, 12:22 PM  

## 2019-01-20 ENCOUNTER — Inpatient Hospital Stay (HOSPITAL_COMMUNITY): Payer: BC Managed Care – PPO

## 2019-01-20 ENCOUNTER — Inpatient Hospital Stay (HOSPITAL_COMMUNITY): Payer: BC Managed Care – PPO | Admitting: Occupational Therapy

## 2019-01-20 ENCOUNTER — Inpatient Hospital Stay (HOSPITAL_COMMUNITY): Payer: BC Managed Care – PPO | Admitting: Physical Therapy

## 2019-01-20 MED ORDER — LORAZEPAM 2 MG/ML IJ SOLN
INTRAMUSCULAR | Status: AC
  Filled 2019-01-20: qty 1

## 2019-01-20 MED ORDER — LEVETIRACETAM 500 MG PO TABS
1000.0000 mg | ORAL_TABLET | Freq: Once | ORAL | Status: AC
  Administered 2019-01-20: 14:00:00 1000 mg via ORAL
  Filled 2019-01-20: qty 2

## 2019-01-20 MED ORDER — LEVETIRACETAM 500 MG PO TABS
500.0000 mg | ORAL_TABLET | Freq: Two times a day (BID) | ORAL | Status: DC
  Administered 2019-01-21 (×2): 500 mg via ORAL
  Filled 2019-01-20 (×2): qty 1

## 2019-01-20 NOTE — Progress Notes (Signed)
EEG complete - results pending 

## 2019-01-20 NOTE — Progress Notes (Addendum)
[place] PHYSICAL MEDICINE & REHABILITATION PROGRESS NOTE   Subjective/Complaints:  Notified of episode of shaking left side of body starting while showering with OT.  This subsided patient was moved to the bed and another episode occurred.  Patient remembers the episodes.  No incontinence.  He is awake and alert post seizure  ROS- denies CP, SOB, N/V/D Objective:   No results found. Recent Labs    01/19/19 0508  WBC 3.6*  HGB 7.8*  HCT 22.0*  PLT 107*   Recent Labs    01/19/19 0508  NA 143  K 4.0  CL 113*  CO2 23  GLUCOSE 123*  BUN 66*  CREATININE 2.82*  CALCIUM 9.0    Intake/Output Summary (Last 24 hours) at 01/20/2019 0806 Last data filed at 01/20/2019 0728 Gross per 24 hour  Intake 640 ml  Output 1200 ml  Net -560 ml     Physical Exam: Vital Signs Blood pressure (!) 171/94, pulse 78, temperature 98.1 F (36.7 C), temperature source Oral, resp. rate 18, height 6\' 1"  (1.854 m), weight 62.7 kg, SpO2 100 %.  General: No acute distress Sitting up in manual w/c in room, watching TV, appropriate, hand on stomach intermittently, NAD Mood and affect are appropriate Heart:RRR no rubs or murmurs Lungs: Clear to auscultation B/L Abdomen: hyperactive bowel sounds, soft, nontender to palpation, nondistended Extremities: No clubbing, cyanosis, or edema Skin: No evidence of breakdown, no evidence of rash Neurologic: Cranial nerves II through XII intact, motor strength is 5/5 in bilateral deltoid, bicep, tricep, grip,2- Left  hip flexor, knee extensors,0/5 Left  ankle dorsiflexor and plantar flexor, 5/5 RLE Sensory exam normal sensation to light touch and proprioception in bilateral upper and lower extremities Cerebellar exam normal finger to nose to finger as well as heel to shin in bilateral upper and lower extremities TOne MAS 2 at Left hamstrings Musculoskeletal: Full range of motion in all 4 extremities. No joint swelling    Assessment/Plan: 1. Functional deficits  secondary to RIght ACA infarct  which require 3+ hours per day of interdisciplinary therapy in a comprehensive inpatient rehab setting.  Physiatrist is providing close team supervision and 24 hour management of active medical problems listed below.  Physiatrist and rehab team continue to assess barriers to discharge/monitor patient progress toward functional and medical goals  Care Tool:  Bathing    Body parts bathed by patient: Right arm, Left arm, Chest, Abdomen, Right upper leg, Left upper leg, Right lower leg, Left lower leg, Face, Front perineal area, Buttocks   Body parts bathed by helper: Front perineal area, Buttocks     Bathing assist Assist Level: Minimal Assistance - Patient > 75%     Upper Body Dressing/Undressing Upper body dressing   What is the patient wearing?: Pull over shirt    Upper body assist Assist Level: Supervision/Verbal cueing    Lower Body Dressing/Undressing Lower body dressing      What is the patient wearing?: Underwear/pull up, Pants     Lower body assist Assist for lower body dressing: Minimal Assistance - Patient > 75%     Toileting Toileting    Toileting assist Assist for toileting: Independent with assistive device Assistive Device Comment: urinal   Transfers Chair/bed transfer  Transfers assist     Chair/bed transfer assist level: Moderate Assistance - Patient 50 - 74%     Locomotion Ambulation   Ambulation assist      Assist level: Moderate Assistance - Patient 50 - 74% Assistive device: Walker-rolling Max  distance: 121ft   Walk 10 feet activity   Assist     Assist level: Moderate Assistance - Patient - 50 - 74% Assistive device: Walker-rolling   Walk 50 feet activity   Assist    Assist level: Moderate Assistance - Patient - 50 - 74% Assistive device: Walker-rolling    Walk 150 feet activity   Assist Walk 150 feet activity did not occur: Safety/medical concerns         Walk 10 feet on uneven  surface  activity   Assist Walk 10 feet on uneven surfaces activity did not occur: Safety/medical concerns         Wheelchair     Assist Will patient use wheelchair at discharge?: No Type of Wheelchair: Manual    Wheelchair assist level: Supervision/Verbal cueing Max wheelchair distance: 100 ft    Wheelchair 50 feet with 2 turns activity    Assist            Wheelchair 150 feet activity     Assist          Blood pressure (!) 171/94, pulse 78, temperature 98.1 F (36.7 C), temperature source Oral, resp. rate 18, height 6\' 1"  (1.854 m), weight 62.7 kg, SpO2 100 %.    Medical Problem List and Plan: 1.  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  Plan 30-day cardiac event monitor as outpatient CIR PT, OT- team conf in am - order PRAFO 2.  Antithrombotics: -DVT/anticoagulation: Lovenox             -antiplatelet therapy: Aspirin 325 mg, Plavix 75 mg daily x3 weeks then aspirin alone 3. Pain Management: Tylenol as needed 4. Mood: Provide emotional support             -antipsychotic agents: N/A 5. Neuropsych: This patient is capable of making decisions on his own behalf. 6. Skin/Wound Care: Routine skin checks 7. Fluids/Electrolytes/Nutrition: Routine in and outs with follow-up chemistries 8.  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  Monitor for any signs of fluid overload 9.  Hypertension. Permissive, but starting to tighten control   Vitals:   01/19/19 2015 01/20/19 0511  BP: (!) 169/88 (!) 171/94  Pulse: 78 78  Resp: 18 18  Temp: 98.1 F (36.7 C)   SpO2: 100% 100%  add amlodipine 2.5mg  may need to adjust dose 10.  ANCA necrotizing concentric glomerulonephritis.  Continue Cytoxan as well as chronic prednisone.  Follow-up per renal services.  Admission creatinine of 2.95 11.   Pancytopenia/chronic anemia.  Follow-up labs patient had been receiving Aranesp as an outpatient with renal services. 12.  Hyperlipidemia.  Lipitor 13.  History of alcohol use.  Counseling and monitor. 14. Concern for spasticity formation- will con't to monitor- since has elevated DTRs on LLE, is at risk for spasticity development. 15. Diarrhea/Bowel issues/Nausea  9/6- will give Zofran prn 4 mg q8 hours prn and monitor 16. Dispo- expect 7-10 days, but will determine per team conference 17.  Flexor spasms LLE add tizanidine qhs  18.  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  Will check CT of the head.  As discussed with Dr. Erlinda Hong, will load with Keppra 1 g today followed by 500 twice daily starting tomorrow.  If left upper extremity strength is not improved by tomorrow, will need limited MRI. Will also check EEG. LOS: 5 days A FACE  TO FACE EVALUATION WAS PERFORMED  Charlett Blake 01/20/2019, 8:06 AM

## 2019-01-20 NOTE — Progress Notes (Signed)
At approximately 1315 OT called nurses station for assistance in 4M09 due to patient having seizure activity lasting approximately one minute while attempting to to warm up shower. Pt presented with flexed head with body/left arm shaking. This RN arrived to the room and pt was transferred back to the wheelchair with mod assist and then to the bed. Asked NT to collect a set of vital signs, as vitals were being taken pt was able to answer simple questions like his name, but with noted slurred speech. Pt then proceeded to have another seizure with his body shaking and the left arm in extension.  Pt again flexed head initially but then it rested back on the pillow during the seizure, again lasting for approximately one minute. Pupils noted to change size during seizure activity. Vitals taken post seizure with PB at 182/100 and HR at 106. Oxygen level was at 100 % on room air.  Pt able to talk post seizure as well as move the LLE to command.  No noted LUE movement to command however. PA notified and STAT CT of head ordered. MD notified and loading dose of Keppra (1000mg ), ordered and given. EEG also ordered by MD. Will monitor patient.

## 2019-01-20 NOTE — Progress Notes (Signed)
Initial Nutrition Assessment  RD working remotely.  DOCUMENTATION CODES:   Underweight  INTERVENTION:   - Snacks BID between meals  - Pt's wife to bring in food from home to aid PO intake at meals  NUTRITION DIAGNOSIS:   Increased nutrient needs related to chronic illness (CHF) as evidenced by estimated needs.  GOAL:   Patient will meet greater than or equal to 90% of their needs  MONITOR:   PO intake, Labs, Weight trends, I & O's  REASON FOR ASSESSMENT:   Other (underweight BMI)    ASSESSMENT:   58 year old male with PMH of nonischemic cardiomyopathy with right heart catheterization 2015, shingles with postherpetic neuralgia hypertension, CHF with ejection fraction of 20%, EtOH abuse, CKD stage IV and glomerulonephritis. Presented 01/12/19 when his left leg became weak and fell without LOC. MRI of the brain showed patchy area of acute infarction in the deep white matter on the right including the corpus callosum possible watershed type infarct. Pt did not receive TPA. MRI lumbar spine showed grade 1 anterior listhesis of L4 on L5 with mild central cord protrusion. 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 Pt admitted to CIR on 9/03.  Noted pt with seizures. 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  Reviewed weight history in chart. Pt with a 3 lb weight loss since admit to CIR. This is a 2.6% weight loss in less than 1 week which is significant for timeframe.  Spoke with pt and his wife via phone call to room. Pt's wife shares that PTA, pt had a very good appetite. Pt's wife shares that she and pt have a large organic garden and are used to eating "real food." Pt's wife shares that pt does not care for most of the food he is served on his meal trays.  Pt's wife reports that she has been brining pt food from home and from restaurants (example: [employer]) to  ensure that pt is "getting enough protein."  Discussed pt's weight loss with pt and wife. Pt's wife reports pt's UBW as 142 lbs. Pt declines oral nutrition supplements but is interested in receiving snacks between meals. Pt likes the vanilla pudding and fresh fruit. RD will order via [facility].  Meal Completion: 50-100% x last 8 recorded meals (averaging 88%)  Medications reviewed and include: Prednisone  Labs reviewed: BUN 66, creatinine 2.82, hemoglobin 7.8  UOP: 900 ml x 24 hours  NUTRITION - FOCUSED PHYSICAL EXAM:  Unable to complete at this time. RD working remotely.  Diet Order:   Diet Order            Diet Heart Room service appropriate? Yes; Fluid consistency: Thin  Diet effective now              EDUCATION NEEDS:   Education needs have been addressed  Skin:  Skin Assessment: Reviewed RN Assessment  Last BM:  01/19/19  Height:   Ht Readings from Last 1 Encounters:  01/19/19 6\' 1"  (1.854 m)    Weight:   Wt Readings from Last 1 Encounters:  01/20/19 62.7 kg    Ideal Body Weight:  83.6 kg  BMI:  Body mass index is 18.24 kg/m.  Estimated Nutritional Needs:   Kcal:  2000-2200  Protein:  90-105 grams  Fluid:  >/= 1.8 L    Gaynell Face, MS, RD, LDN Inpatient Clinical Dietitian Pager: [phone] Weekend/After Hours: [phone]

## 2019-01-20 NOTE — Progress Notes (Signed)
Occupational Therapy Session Note  Patient Details  Name: Jeremy Grant MRN: [id number] Date of Birth: 10/06/1960  Today's Date: 01/20/2019 OT Individual Time: 0901-1002 OT Individual Time Calculation (min): 61 min    Short Term Goals: Week 1:  OT Short Term Goal 1 (Week 1): Pt will complete toilet transfers with use of the RW and min guard assist. OT Short Term Goal 2 (Week 1): Pt will complete LB bathing with min guard assist sit to stand. OT Short Term Goal 3 (Week 1): Pt will complete LB dressing with min guard assist sit to stand.  Skilled Therapeutic Interventions/Progress Updates:    Pt worked on dressing EOB to start session. He needed min assist for upright posture while donning shorts secondary to posterior LOB.  He was able to donn his shoes and AFO on the left foot at min assist as well secondary to increased LOB to the left.  He then ambulated to the sink and stood to complete oral hygiene.  Min assist for balance secondary to posterior LOB when attempting to step back to the wheelchair to sit.  He was next taken down to the therapy gym where he worked on dynamic trunk control on the therapy mat.  Worked in supine on rolling with activation of abdominals to both the right and left sides.  Worked on transitions to sitting from left sidelying as well with overall min assist for activation of the left elbow extensors and left trunk.  He completed lateral reaching to both sides to target as well as having to hold a therapy ball and work on rotations to the left and right.  Max instructional cueing to maintain upright posture with anterior pelvic tilt as he stays in flexed posture in sitting.  LOB to the left at times when reaching with BUEs to the left while holding the therapy ball.  Had pt then work on crossing the LLE over the right knee for simulated dressing while not losing his balance to the left.  He needed min assist for this as well as when attempting to reach down to the left  foot for tying his shoe.  Finished session with return to the wheelchair and return back to the room.  Call button and phone in reach with safety alarm belt in place.    Therapy Documentation Precautions:  Precautions Precautions: Fall Precaution Comments: LLE paresis more severe than in the LUE Restrictions Weight Bearing Restrictions: No  Pain: Pain Assessment Pain Scale: Faces Pain Score: 0-No pain ADL: See Care Tool Section for some details of ADL and mobility  Therapy/Group: Individual Therapy  Neala Miggins OTR/L 01/20/2019, 12:21 PM

## 2019-01-20 NOTE — Progress Notes (Signed)
Orthopedic Tech Progress Note Patient Details:  Jeremy Grant February 15, 1961 [id number] Called in order to [employer] for an AFO Patient ID: [name], male   DOB: 26-Nov-1960, 58 y.o.   MRN: [id number]   [name] 01/20/2019, 11:38 AM

## 2019-01-20 NOTE — Progress Notes (Signed)
Social Work Patient ID: Jeremy Grant, male   DOB: 1960/11/07, 58 y.o.   MRN: [id number]  Met with pt and wife who is here and has questions regarding a LOA form. Have given it to Dan-PA to complete. Wife also discussed pt's seizure and needing now to be on seizure medicines and awaiting CT and EEG tests. Aware team conference tomorrow and target date will be set. Will touch base with wife tomorrow.

## 2019-01-20 NOTE — Progress Notes (Signed)
Occupational Therapy Session Note  Patient Details  Name: Jeremy Grant MRN: [id number] Date of Birth: [birth year]  Today's Date: 01/20/2019 OT Individual Time: 0762-2633 OT Individual Time Calculation (min): 30 min    Short Term Goals: Week 1:  OT Short Term Goal 1 (Week 1): Pt will complete toilet transfers with use of the RW and min guard assist. OT Short Term Goal 2 (Week 1): Pt will complete LB bathing with min guard assist sit to stand. OT Short Term Goal 3 (Week 1): Pt will complete LB dressing with min guard assist sit to stand.  Skilled Therapeutic Interventions/Progress Updates:    Pt seen for OT treatment with agreement to work on ADL tasks this session with spouse present.  He was able to ambulate to the shower with min assist using the RW with AFO in place.  Noted occasional misstep not advancing the LLE but instead advancing the right.  Once sitting on the shower bench, he was able to undress himself with min assist sit to stand.  One LOB in sitting to the left when attempting to reach down and untie his left shoe.  When attempting to to warm up shower pt with noted seizure activity lasting approximately one minute.  He exhibited flexed head with body/left arm shaking.  Nursing called and pt was transferred back to the wheelchair with mod assist and to the bed.  As vitals were being taken pt was able to answer simple questions like his name, but with noted slurred speech.  He then proceeded to have another seizure with his body shaking and the left arm in extension.  Flexed head initially noted but then it rested back on the pillow during the seizure, again lasting for approximately one minute.  Vitals taken post seizure with PB at 182/100 and HR at 106.  Oxygen level was at 100 % on room air.  Pt able to talk post seizure as well as move the LLE to command.  No noted LUE movement to command however.  Pt left in bed to rest with stat CT called by PA who was in the room after the second  event.  Pt missed 40 mins of OT this session.    Therapy Documentation Precautions:  Precautions Precautions: Fall Precaution Comments: LLE paresis more severe than in the LUE Restrictions Weight Bearing Restrictions: No General: General OT Amount of Missed Time: 30 Minutes Vital Signs: Therapy Vitals Temp: (!) 97.4 F (36.3 C) Temp Source: Oral Pulse Rate: (!) 106 Resp: 20 BP: (!) 182/100 Patient Position (if appropriate): Lying Oxygen Therapy SpO2: 100 % O2 Device: Room Air Pain: Pain Assessment Pain Scale: Faces Pain Score: 0-No pain ADL: See Care Tool for some details of ADL   Therapy/Group: Individual Therapy  Jianna Drabik OTR/L 01/20/2019, 1:43 PM

## 2019-01-20 NOTE — Progress Notes (Signed)
Physical Therapy Session Note  Patient Details  Name: Jeremy Grant MRN: [id number] Date of Birth: [birth year]  Today's Date: 01/20/2019 PT Individual Time: 1120-1204 and 1700-1748 PT Individual Time Calculation (min): 44 min and 48 min  Short Term Goals: Week 1:  PT Short Term Goal 1 (Week 1): Pt will be able to perform bed mobility with CGA PT Short Term Goal 2 (Week 1): Pt will be able to perform functional transfers with CGA PT Short Term Goal 3 (Week 1): Pt will be able to gait x 100' with min assist PT Short Term Goal 4 (Week 1): Pt will be able to perform 4 stairs with rails with CGA  Skilled Therapeutic Interventions/Progress Updates:    Session 1: Pt received sitting in w/c with his wife present and pt agreeable to therapy session. Sit<>stands with CGA/min assist for steadying/lifting throughout session - pt demonstrates R swept hip posturing when sitting at rest with cuing and mirror feedback for correction throughout session. Ambulated ~174ft using RW with min assist for balance, wife providing w/c follow for fatigue, pt continues to demonstrate decreased L LE foot clearance as well as increased L hip adduction and internal rotation during swing with cuing throughout for improved gait mechanics - demonstrates sustained L knee flexion during stance as pt is trying to avoid knee hyperextension. Stand pivot w/c<>EOM, no AD, with min assist for balance and mod cuing for sequencing. Performed L LE swing phase gait mechanics NMR task via tapping L foot forward and abducted towards cone without UE then with UE support on RW and pt demonstrating significant difficulty with this task and unable to tap cone with L LE despite mod assist for balance and manual facilitation for weight shifting and L LE movement. Transitioned to L LE foot taps up/down on 6" step toward external target to increase hip abduction and external rotation with B UE support on RW progressed to no UE support with mod assist for  balance as pt demonstrates L lateral trunk lean/LOB - mod/max cuing throughout for proper L LE mechanics and balance along with mirror feedback. Stand pivot w/c>EOM using RW with CGA for steadying. Transported back to room in w/c. Pt left sitting in w/c with needs in reach, seat belt alarm on, and pt's wife present.   Session 2: Therapist spoke with Pam, PA and she cleared pt to participate in bed level exercises as long as pt had received keppra and was back to baseline - therapist spoke with RN and confirmed medication administration and RN reports pt is back to baseline. Pt received supine in bed and agreeable to therapy session stating he feels that he is back to baseline. Reassessed pt's L LE strength in supine: hip flexion 3-/5, hip abduction 2-/5 (with some compensation from hip flexors), hip adduction 1/5 to 2-/5 (with some compensation from hip flexors), knee flexion 2-/5, knee extension 2-/5 with pt demonstrating most activation in rectus femoris with minimal to no activation in the 3 vastus quad muscles, ankle DF 0/5, ankle PF 1/5.  Performed the following L LE supine exercisesfor NMR 2 sets of 10-15 reps each with manual facilitation and multimodal cuing for proper technique and increased muscle activation:  - heel slides with cuing to improve hip abduction/adduction control - short arc quads with assist for movement as pt continuing to demonstrate over activation of rectus femoris resulting combined in hip flexion with minimal activation of other quadriceps muscles - hip abduction/adduction with assist for movement - D1 PNF pattern with  pt demonstrating ability to slightly activate hip extensors with max cuing During exercises the EEG technician arrived and was present during part of therapy session. Pt left supine in bed in the care of the EEG technician.    Therapy Documentation Precautions:  Precautions Precautions: Fall Precaution Comments: LLE paresis more severe than in the  LUE Restrictions Weight Bearing Restrictions: No  Pain: Session 1: Denies pain during session.  Session 2: Reports some L hip discomfort during increased hip movement due to the bruising from his fall - therapist provided ice pack at end of session.   Therapy/Group: Individual Therapy  Tawana Scale, PT, DPT 01/20/2019, 7:56 AM

## 2019-01-20 NOTE — Progress Notes (Signed)
Orthopedic Tech Progress Note Patient Details:  GILMAN OLAZABAL [birth year] [id number] Called in order to [employer] for a PRAFO boot Patient ID: JAHAD OLD, male   DOB: 1960/08/23, 58 y.o.   MRN: [id number]   [name] 01/20/2019, 8:22 AM

## 2019-01-21 ENCOUNTER — Inpatient Hospital Stay (HOSPITAL_COMMUNITY): Payer: BC Managed Care – PPO

## 2019-01-21 ENCOUNTER — Encounter (HOSPITAL_COMMUNITY): Payer: BC Managed Care – PPO | Admitting: Psychology

## 2019-01-21 ENCOUNTER — Inpatient Hospital Stay (HOSPITAL_COMMUNITY): Payer: BC Managed Care – PPO | Admitting: Occupational Therapy

## 2019-01-21 LAB — CBC
HCT: 20.3 % — ABNORMAL LOW (ref 39.0–52.0)
Hemoglobin: 7.4 g/dL — ABNORMAL LOW (ref 13.0–17.0)
MCH: 36.3 pg — ABNORMAL HIGH (ref 26.0–34.0)
MCHC: 36.5 g/dL — ABNORMAL HIGH (ref 30.0–36.0)
MCV: 99.5 fL (ref 80.0–100.0)
Platelets: 110 10*3/uL — ABNORMAL LOW (ref 150–400)
RBC: 2.04 MIL/uL — ABNORMAL LOW (ref 4.22–5.81)
RDW: 13.2 % (ref 11.5–15.5)
WBC: 3.6 10*3/uL — ABNORMAL LOW (ref 4.0–10.5)
nRBC: 0 % (ref 0.0–0.2)

## 2019-01-21 NOTE — Procedures (Signed)
Patient Name: Jeremy Grant  MRN: [id number]  Epilepsy Attending: Lora Havens  Referring Physician/Provider: Dr Alysia Penna Date: 01/20/2019 Duration: 23.40 mins  Patient history: 58 year old male with right-sided acute infarct and an episode of left-sided shaking.  EEG to evaluate for seizure.  Level of alertness: Awake, drowsy  AEDs during EEG study: Keppra  Technical aspects: This EEG study was done with scalp electrodes positioned according to the 10-20 International system of electrode placement. Electrical activity was acquired at a sampling rate of 500Hz  and reviewed with a high frequency filter of 70Hz  and a low frequency filter of 1Hz . EEG data were recorded continuously and digitally stored.   Description: The posterior dominant rhythm consists of 10-11 Hz activity of moderate voltage (25-35 uV) seen predominantly in posterior head regions, symmetric and reactive to eye opening and eye closing. Drowsiness was characterized by attenuation of the posterior background rhythm. Hyperventilation and photic stimulation were not performed.  IMPRESSION: This study is within normal limits. However, only wakefulness and drowsiness were recorded. If suspicion for interictal activity remains a concern, a prolonged study including sleep should be considered.   No seizures or epileptiform discharges were seen throughout the recording.   Lisette Mancebo Barbra Sarks

## 2019-01-21 NOTE — Progress Notes (Signed)
Social Work Patient ID: Jeremy Grant, male   DOB: 01/17/1961, 58 y.o.   MRN: [id number]  Met with pt and wife to discuss team conference goals supervision/mod/i level and target discharge 9/19. Wife does hope to go back to work soon. She does realize he will need someone with him at the beginning and plans to be there with him. She is concerned about the two seizures he has had the past two days. Will provide support and work toward discharge date.  

## 2019-01-21 NOTE — Progress Notes (Signed)
Pt refused the night dose of Zanaflex. Pt states the doctor told him the side effects could of caused the seizure. Pt has slept well and no signs of seizure activity. Pt denies pain at this time. Will continue to monitor.

## 2019-01-21 NOTE — Patient Care Conference (Signed)
Inpatient RehabilitationTeam Conference and Plan of Care Update Date: 01/21/2019   Time: 11:05 AM    Patient Name: Jeremy Grant      Medical Record Number: [id number]  Date of Birth: 09-01-1960 Sex: Male         Room/Bed: 4M09C/4M09C-01 Payor Info: Payor: BLUE CROSS BLUE SHIELD / Plan: BCBS COMM PPO / Product Type: *No Product type* /    Admitting Diagnosis: 6. CVA 2 Team RT. CVA due to embolism; 65-15days  Admit Date/Time:  01/15/2019  6:52 PM Admission Comments: No comment available   Primary Diagnosis:  Acute unilateral cerebral infarction in a watershed distribution [facility]) Principal Problem: Acute unilateral cerebral infarction in a watershed distribution [facility])  Patient Active Problem List   Diagnosis Date Noted  . Acute unilateral cerebral infarction in a watershed distribution [facility]) 01/15/2019  . Weakness   . CVA (cerebral vascular accident) ([place]) 01/12/2019  . Malnutrition of moderate degree 07/31/2018  . Elevated serum protein level   . Multiple myeloma ([place])   . Pancytopenia ([place])   . Normocytic normochromic anemia 07/16/2018  . Acute kidney failure ([place]) 07/16/2018  . NICM (nonischemic cardiomyopathy) ([place]) 11/12/2013  . History of ETOH abuse 11/12/2013  . Chronic combined systolic and diastolic CHF (congestive heart failure) ([place]) 11/12/2013  . Congestive dilated cardiomyopathy ([place]) 11/04/2013  . Malignant hypertension     Expected Discharge Date: Expected Discharge Date: 01/31/19  Team Members Present: Physician leading conference: Dr. Alysia Penna Social Worker Present: Ovidio Kin, LCSW Nurse Present: Judee Clara, LPN PT Present: Donnamae Jude, PT OT Present: Clyda Greener, OT SLP Present: Charolett Bumpers, SLP PPS Coordinator present : Gunnar Fusi, SLP     Current Status/Progress Goal Weekly Team Focus  Medical   seizure yesterday, repeat CT negative , no recurrence on Keppra, anemia is chronic but hgb lower, asymptomatic  control seizures,   monitor for blood loss   Bowel/Bladder   Pt is continent of B/B  Pt will remain continent of B/B  Q2h toileting   Swallow/Nutrition/ Hydration             ADL's   supervision for UB bathing and dressing, min assist for LB bathing and dressing.  Good distal movement and use of the LUE with more proximatl weakness in the scapula and trunk.  Transfers are currently min assist with use of the RW and AFO as well.  supervision to modified independent  selfcare retraining, transfer training, neuromuscular re-education, balance retraining, DME education, neuromuscular re-education   Mobility   min assist bed mobility, min assist transfers, min assist gait up to 115f using RW  mod-I bed mobility, transfers, and gait up to 1529fwith LRAD; supervision car transfer  L hemibody neuromuscular re-education, gait training, transfer training, standing balance, activity tolerance/endurance   Communication             Safety/Cognition/ Behavioral Observations            Pain   pt denies "pain:. C/o general discomfort, and being sore from therapy.  Pt wil have pain score <3  Assess pain Qshift/ PRN   Skin   Pt skin is intact, with minor bruising.  Pt skin will remain free of breakdown and free of infection.  Assess sking Qshift/ PRN      *See Care Plan and progress notes for long and short-term goals.     Barriers to Discharge  Current Status/Progress Possible Resolutions Date Resolved   Physician    Medical stability;Other (comments)  new onset seizures  so far controlled on Keppra  cont med management adjsut as needed, monitor hgb, cont cytoxan      Nursing                  PT                    OT                  SLP                SW                Discharge Planning/Teaching Needs:  Home with wife who works during the day, hopeful he can stay home alone while she is working.      Team Discussion:  Goals supervision-mod/i level. Currently min assist-wheelchair level. Endurance  issues and seizures yesterday. CT and EEG were normal. Now on seizure meds. Chronic low hemoglobin. Wife has been here for some of his therapies.  Revisions to Treatment Plan:  DC 9/19    Continued Need for Acute Rehabilitation Level of Care: The patient requires daily medical management by a physician with specialized training in physical medicine and rehabilitation for the following conditions: Daily direction of a multidisciplinary physical rehabilitation program to ensure safe treatment while eliciting the highest outcome that is of practical value to the patient.: Yes Daily medical management of patient stability for increased activity during participation in an intensive rehabilitation regime.: Yes Daily analysis of laboratory values and/or radiology reports with any subsequent need for medication adjustment of medical intervention for : Neurological problems;Other   I attest that I was present, lead the team conference, and concur with the assessment and plan of the team. Teleconference held due to COVID 19   Averie Hornbaker, Gardiner Rhyme 01/21/2019, 3:02 PM

## 2019-01-21 NOTE — Progress Notes (Signed)
Occupational Therapy Session Note  Patient Details  Name: Jeremy Grant MRN: [id number] Date of Birth: 02-21-1961  Today's Date: 01/21/2019 OT Individual Time: 1001-1101 OT Individual Time Calculation (min): 60 min    Short Term Goals: Week 1:  OT Short Term Goal 1 (Week 1): Pt will complete toilet transfers with use of the RW and min guard assist. OT Short Term Goal 2 (Week 1): Pt will complete LB bathing with min guard assist sit to stand. OT Short Term Goal 3 (Week 1): Pt will complete LB dressing with min guard assist sit to stand.  Skilled Therapeutic Interventions/Progress Updates:    Pt completed shower and dressing to start session.  He was able to ambulate to the shower with min assist using the RW and left AFO in place.  Supervision for removal of UB clothing as well as removal of socks, shoes, and left AFO.  Min assist to stand for removal of pants and underpants.  Next he completed shower in sitting with close supervision.  Lateral leans for washing peri area as well.  He dried off with min assist sit to stand and ambulated out to the bed for dressing at min assist as well.  Supervision for donning pullover shirt with min assist for all LB dressing.  No LOB noted posteriorly or to the left this session when donning socks, shoes, or AFO.  He then ambulated to the sink with min assist and maintained standing balance for completion of brushing his teeth and combing his hair.  Mod facilitation needed to maintain left knee extension in standing with only min assist needed to maintain standing balance.  Had him sit to complete shaving task however for safety. Next pt of session had pt propel his wheelchair down to the therapy gym with supervision and complete stand pivot transfer to the therapy mat.  He worked on dynamic trunk control with ball toss and catch in sitting.  Max instructional cueing to maintain anterior pelvic tilt and lumbar extension when catching and tossing the larger yellow  therapy ball.  He demonstrated 90% consistency with catching the ball and no LOB noted to the left, even when catching it to the left side.  Min assist needed for reaching down to the floor on the left and picking the ball up however secondary to occasional LOB.  Finished session with return to the wheelchair with min assist and transport back to the room. Call button and phone in reach with safety belt in place.    Therapy Documentation Precautions:  Precautions Precautions: Fall Precaution Comments: LLE paresis more severe than in the LUE Restrictions Weight Bearing Restrictions: No Therapy Vitals Temp: 98.3 F (36.8 C) Temp Source: Oral Pulse Rate: 85 Resp: 19 BP: (!) 183/71 Patient Position (if appropriate): Lying Oxygen Therapy SpO2: 100 % O2 Device: Room Air Pain: Pain Assessment Pain Scale: Faces Pain Score: 0-No pain ADL: See Care Tool Section for some details of ADL and mobility  Therapy/Group: Individual Therapy  Lenda Baratta OTR/L 01/21/2019, 12:22 PM

## 2019-01-21 NOTE — Progress Notes (Signed)
Physical Therapy Session Note  Patient Details  Name: Jeremy Grant MRN: [id number] Date of Birth: November 03, 1960  Today's Date: 01/21/2019 PT Individual Time: 0800-0920 PT Individual Time Calculation (min): 80 min   Short Term Goals: Week 1:  PT Short Term Goal 1 (Week 1): Pt will be able to perform bed mobility with CGA PT Short Term Goal 2 (Week 1): Pt will be able to perform functional transfers with CGA PT Short Term Goal 3 (Week 1): Pt will be able to gait x 100' with min assist PT Short Term Goal 4 (Week 1): Pt will be able to perform 4 stairs with rails with CGA  Skilled Therapeutic Interventions/Progress Updates:  Pt resting in bed.  He denied pain.  Supine> sit with superivison in flat bed, using rail.  Sitting EOB, pt donned shorts and shirt with supervsion, cues for seuencing.  He donned bil shoes (no AFO) but needed min assist to toe shoes.  Sit>< stand with CGA.  L knee hyperextends slightly.  Stand pivot to R to w/c with mod assist for L knee control, RW.  neuromuscular re-education via forced use, mirror feedback and mulitmodal cues for trunk activation, midline orientation, L hip neutral rotation, L LE extension using [employer] in sitting at level 30 cm /sec for alternating reciprocal movement, and in standng at level 50 for full wt shifting L><R. Pt demonstrates minimal L mass extension for pushing down foot plate with L foot.  Seated in w/c: L knee extension with wash cloth under L shoe; bil adductor squaeezes and bil scapular adduction x 20 each, using mirror. W/c propulsion using LUE only, with min assist for steering, x 50' x 2.    Gait training with L AFO, RW, mod assist on level tile x 50' including turns.  Assistance needed for balance, L stance stability; mod cues for slower pace, moving RW far enough ahead, upright stance and forward gaze.  Up/down (4) 6" high steps, bil rails, min assist.  Mod cues for L foot placement, L hand progression. Limited knee control despite  AFO, but safe.  At end of session, pt seated in w/c with needs at hand, and seat belt alarm set.      Therapy Documentation Precautions:  Precautions Precautions: Fall Precaution Comments: LLE paresis more severe than in the LUE Restrictions Weight Bearing Restrictions: No       Therapy/Group: Individual Therapy  Keandria Berrocal 01/21/2019, 9:22 AM

## 2019-01-21 NOTE — Progress Notes (Signed)
[facility] PHYSICAL MEDICINE & REHABILITATION PROGRESS NOTE   Subjective/Complaints:  No further seizures, Left arm stronger per PT and pt   ROS- denies CP, SOB, N/V/D Objective:   Ct Head Wo Contrast  Result Date: 01/20/2019 CLINICAL DATA:  58 year old with nonischemic cardiomyopathy, combined systolic and diastolic heart failure, presenting with new onset seizures. EXAM: CT HEAD WITHOUT CONTRAST TECHNIQUE: Contiguous axial images were obtained from the base of the skull through the vertex without intravenous contrast. COMPARISON:  MRI brain 01/11/2019. No prior CT head. 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 No associated hemorrhage or hematoma. No mass effect or midline shift. No extra-axial fluid collections. No new/acute intracranial abnormalities. Ventricular system normal in size and appearance for age. No mass lesion. Vascular: Moderate BILATERAL carotid siphon and severe BILATERAL vertebral artery atherosclerosis. No hyperdense vessel. Skull: No skull fracture or other focal osseous abnormality involving the skull. Sinuses/Orbits: Visualized paranasal sinuses, bilateral mastoid air cells and bilateral middle ear cavities well-aerated. Visualized orbits and globes normal in appearance. Other: None. IMPRESSION: 1. 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 No associated hemorrhage or hematoma. 2. No new/acute intracranial abnormality. Electronically Signed   By: Evangeline Dakin M.D.   On: 01/20/2019 19:11   Recent Labs    01/19/19 0508 01/21/19 0527  WBC 3.6* 3.6*  HGB 7.8* 7.4*  HCT 22.0* 20.3*  PLT 107* 110*   Recent Labs    01/19/19 0508  NA 143  K 4.0  CL 113*  CO2 23  GLUCOSE 123*  BUN 66*   CREATININE 2.82*  CALCIUM 9.0    Intake/Output Summary (Last 24 hours) at 01/21/2019 0834 Last data filed at 01/21/2019 0800 Gross per 24 hour  Intake 840 ml  Output 1150 ml  Net -310 ml     Physical Exam: Vital Signs Blood pressure (!) 160/70, pulse 71, temperature 98.7 F (37.1 C), resp. rate 16, height _0  (1.854 m), weight 62.6 kg, SpO2 100 %.  General: No acute distress Sitting up in manual w/c in room, watching TV, appropriate, hand on stomach intermittently, NAD Mood and affect are appropriate Heart:RRR no rubs or murmurs Lungs: Clear to auscultation B/L Abdomen: hyperactive bowel sounds, soft, nontender to palpation, nondistended Extremities: No clubbing, cyanosis, or edema Skin: No evidence of breakdown, no evidence of rash Neurologic: Cranial nerves II through XII intact, motor strength is 5/5 in bilateral deltoid, bicep, tricep, grip,2- Left  hip flexor, knee extensors,0/5 Left  ankle dorsiflexor and plantar flexor, 5/5 RLE Sensory exam normal sensation to light touch and proprioception in bilateral upper and lower extremities Cerebellar exam normal finger to nose to finger as well as heel to shin in bilateral upper and lower extremities TOne MAS 2 at Left hamstrings Musculoskeletal: Full range of motion in all 4 extremities. No joint swelling    Assessment/Plan: 1. 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  Physiatrist is providing close team supervision and 24 hour management of active medical problems listed below.  Physiatrist and rehab team continue to assess barriers to discharge/monitor patient progress toward functional and medical goals  Care Tool:  Bathing    Body parts bathed by  patient: Right arm, Left arm, Chest, Abdomen, Right upper leg, Left upper leg, Right lower leg, Left lower leg, Face, Front perineal area, Buttocks   Body parts  bathed by helper: Front perineal area, Buttocks     Bathing assist Assist Level: Minimal Assistance - Patient > 75%     Upper Body Dressing/Undressing Upper body dressing   What is the patient wearing?: Pull over shirt    Upper body assist Assist Level: Supervision/Verbal cueing    Lower Body Dressing/Undressing Lower body dressing      What is the patient wearing?: Pants     Lower body assist Assist for lower body dressing: Minimal Assistance - Patient > 75%     Toileting Toileting    Toileting assist Assist for toileting: Independent with assistive device Assistive Device Comment: urinal   Transfers Chair/bed transfer  Transfers assist     Chair/bed transfer assist level: Minimal Assistance - Patient > 75%     Locomotion Ambulation   Ambulation assist      Assist level: Minimal Assistance - Patient > 75% Assistive device: Walker-rolling Max distance: 169f   Walk 10 feet activity   Assist     Assist level: Minimal Assistance - Patient > 75% Assistive device: Walker-rolling   Walk 50 feet activity   Assist    Assist level: Minimal Assistance - Patient > 75% Assistive device: Walker-rolling    Walk 150 feet activity   Assist Walk 150 feet activity did not occur: Safety/medical concerns         Walk 10 feet on uneven surface  activity   Assist Walk 10 feet on uneven surfaces activity did not occur: Safety/medical concerns         Wheelchair     Assist Will patient use wheelchair at discharge?: No Type of Wheelchair: Manual    Wheelchair assist level: Supervision/Verbal cueing Max wheelchair distance: 100 ft    Wheelchair 50 feet with 2 turns activity    Assist            Wheelchair 150 feet activity     Assist          Blood pressure (!) 160/70, pulse 71, temperature 98.7 F (37.1 C), resp. rate 16, height _0  (1.854 m), weight 62.6 kg, SpO2 100 %.    Medical Problem List and Plan: 1.  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  Plan 30-day cardiac event monitor as outpatient CIR PT, OT- Team conference today please see physician documentation under team conference tab, met with team face-to-face to discuss problems,progress, and goals. Formulized individual treatment plan based on medical history, underlying problem and comorbidities. 2.  Antithrombotics: -DVT/anticoagulation: Lovenox             -antiplatelet therapy: Aspirin 325 mg, Plavix 75 mg daily x3 weeks then aspirin alone 3. Pain Management: Tylenol as needed 4. Mood: Provide emotional support             -antipsychotic agents: N/A 5. Neuropsych: This patient is capable of making decisions on his own behalf. 6. Skin/Wound Care: Routine skin checks 7. Fluids/Electrolytes/Nutrition: Routine in and outs with follow-up chemistries 8.  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  Monitor for any signs of fluid overload 9.  Hypertension. Permissive, but starting to tighten control   Vitals:   01/20/19 1931 01/21/19 0546  BP: (!) 160/81 (!) 160/70  Pulse: 75 71  Resp: 18 16  Temp: 97.9 F (36.6 C) 98.7 F (37.1 C)  SpO2: 100% 100%  on  amlodipine 2.26m may need to adjust dose 10.  ANCA necrotizing concentric glomerulonephritis.  Continue Cytoxan as well as chronic prednisone.  Follow-up per renal services.  Admission creatinine of 2.95 11.  Pancytopenia/chronic anemia.  Follow-up labs patient had been receiving Aranesp as an outpatient with renal services. 12.  Hyperlipidemia.  Lipitor 13.  History of alcohol use.  Counseling and monitor. 14. Concern for spasticity formation- will con't to monitor- since has elevated DTRs on LLE, is at risk for spasticity development. 15. Diarrhea/Bowel issues/Nausea  9/6- will give Zofran prn 4 mg q8 hours prn and monitor 16.  Dispo- expect 7-10 days, but will determine per team conference 17.  Flexor spasms LLE add tizanidine qhs- reassured pt that sei  18.  Seizure left-sided post stroke.   Todd's paralysis left upper extremityresolved .  CT of the head showed expected evolution .  As discussed with Dr. XErlinda Hong will lrx Keppra  500 twice daily starting today.  Will also check EEG. LOS: 6 days A FACE TO [place] 01/21/2019, 8:34 AM

## 2019-01-21 NOTE — Progress Notes (Signed)
Responded to wife emergency call in room with patient unconscious on toilet trying to have a bowel movement for about 60 seconds. Assisted patient to bed, patient re-gain consciousness and begins to speak to staff. PA notified and assessed patient. Patient A&Ox4, wife at bedside. Monitor VS q 43mins for the next hour.  No s/s of respiratory distress. No complications noted at this time. Patient awaiting lunch, continue plan of care.  Audie Clear, LPN

## 2019-01-21 NOTE — Consult Note (Signed)
Neuropsychological Consultation   Patient:   Jeremy Grant   DOB:   March 29, 1961  MR Number:  [id number]  Location:  [facility] [address] [place] [id number] [place] [address] Dept: [place]: [phone]           Date of Service:   01/21/2019  Start Time:   11 AM End Time:   12 PM  Provider/Observer:  Ilean Skill, Psy.D.       Clinical Neuropsychologist       Billing Code/Service: [phone]  Chief Complaint:    Jeremy Grant is a 58 year old male with a history of nonischemic cardiomyopathy with right heart catheterization in 2015, shingles, combined systolic and diastolic congestive heart failure with ejection fraction of 20%, alcohol abuse, chronic kidney disease stage IV, as well as significant kidney disease that has required prednisone.  The patient presented on 01/12/2019 when his left leg became weak and fell without loss of consciousness.  He initially did not come to the hospital.  MRI of brain showed patchy area of acute infarction in the deep white matter on the right including the corpus callosum possible watershed type infarction.  Patient did not receive TPA.  MRI lumbar spine showed grade 1 anterior listhesis of L4 on L5 with mild central cord protrusion.  Reason for Service:  The patient was referred for neuropsychological consultation due to coping and adjustment issues.  Below is the HPI for the current admission.   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  Per chart review lives with spouse.  Independent prior to admission he had been working at [employer] up until March 2020.  1 level home with 3 steps to entry.  Presented 01/12/2019 when his left leg became weak and fell without loss of consciousness.  He initially did not come to the hospital.  Hemoglobin 8.8, creatinine 2.95, urine drug screen negative.  MRI of the brain showed patchy area of acute infarction in the deep white matter on the right including the corpus callosum possible watershed type infarct.  Patient did not receive TPA.  MRI lumbar spine showed grade 1 anterior listhesis of L4 on L5 with mild central cord protrusion.  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  Echocardiogram with ejection fraction of 15% normal systolic function.  Carotid Dopplers to 1-39% ICA stenosis.  Neurology follow-up maintained on aspirin and Plavix for CVA prophylaxis x3 weeks then aspirin alone.  No plan for TEE or loop recorder at this time.  Subcutaneous Lovenox for DVT prophylaxis.  Tolerating a regular diet.  Therapy evaluations completed and patient was admitted for a comprehensive rehab program.  Current Status:  The patient reports that he has experience changes in information processing speed, he denies depression or anxiety symptoms.  The patient was aware of his various medical issues and motivated to work on therapeutic efforts.  The patient reports that his kidney functions had been getting better prior to stroke.    Behavioral Observation: Jeremy Grant  Vetsch  presents as a 58 y.o.-year-old Right Caucasian Male who appeared his stated age. his dress was Appropriate and he was Well Groomed and his manners were Appropriate to the situation.  his participation was indicative of Appropriate and Redirectable behaviors.  There were any physical disabilities noted.  he  displayed an appropriate level of cooperation and motivation.     Interactions:    Active Appropriate and Redirectable  Attention:   abnormal and attention span appeared shorter than expected for age  Memory:   within normal limits; recent and remote memory intact  Visuo-spatial:  not examined  Speech (Volume):  low  Speech:   normal; normal  Thought Process:  Coherent and Relevant  Though Content:  WNL; not suicidal and not homicidal  Orientation:   person, place and time/date  Judgment:   Fair  Planning:   Poor  Affect:    Appropriate  Mood:    Dysphoric  Insight:   Fair  Intelligence:   normal   Medical History:   Past Medical History:  Diagnosis Date  . Arthritis   . CHF (congestive heart failure) ([place])   . Combined systolic and diastolic cardiac dysfunction    Echo 11/03/2013 EF 17%, grade 3 diastolic dysfunction  . Hypertension   . NICM (nonischemic cardiomyopathy) ([place])    a. L/RHC (11/05/13): RA: 3, RV 52/5, PA 49/19 (31), PCWP 10, AO 166/93, PA 67%, Fick CO/CI: 5.71/2.97, Lmain: normal, LAD: large, without signficant dz, first diagonal has 20% dz at ostium, LCx: normal, RCA: 30% stenosis at the bifurcation of PDA and PLOM     Psychiatric History:  Patient denies prior psychiatric history  Family Med/Psych History:  Family History  Problem Relation Age of Onset  . Heart attack Father   . Heart disease Father   . Arrhythmia Father   . Hypertension Brother   . Hypertension Brother     Risk of Suicide/Violence: low Patient denies SI or HI.  Impression/DX:  Jeremy Grant is a 58 year old male with a history of nonischemic cardiomyopathy with right heart catheterization in 2015, shingles, combined systolic and diastolic congestive heart failure with ejection fraction of 20%, alcohol abuse, chronic kidney disease stage IV, as well as significant kidney disease that has required prednisone.  The patient presented on 01/12/2019 when his left leg became  weak and fell without loss of consciousness.  He initially did not come to the hospital.  MRI of brain showed patchy area of acute infarction in the deep white matter on the right including the corpus callosum possible watershed type infarction.  Patient did not receive TPA.  MRI lumbar spine showed grade 1 anterior listhesis of L4 on L5 with mild central cord protrusion.  The patient reports that he has experience changes in information processing speed, he denies depression or anxiety symptoms.  The patient was aware of his various medical issues and motivated to work on therapeutic efforts.  The patient reports that his kidney functions had been getting better prior to stroke.    Diagnosis:    Acute unilateral cerebral infarction in a watershed distribution [facility]) - Plan: Ambulatory referral to Neurology         Electronically Signed   _______________________ Ilean Skill, Psy.D.

## 2019-01-21 NOTE — Progress Notes (Signed)
Physical Therapy Session Note  Patient Details  Name: Jeremy Grant MRN: [id number] Date of Birth: Nov 16, 1960  Today's Date: 01/21/2019  1300   Pt missed 60 min session due to fatigue, s/p seizure this AM  Short Term Goals: Week 1:  PT Short Term Goal 1 (Week 1): Pt will be able to perform bed mobility with CGA PT Short Term Goal 2 (Week 1): Pt will be able to perform functional transfers with CGA PT Short Term Goal 3 (Week 1): Pt will be able to gait x 100' with min assist PT Short Term Goal 4 (Week 1): Pt will be able to perform 4 stairs with rails with CGA  Skilled Therapeutic Interventions/Progress Updates:  Pt eating lunch sitting propped in bed.  His wife reported that he had a seizure while on the toilet today.  PT consulted with Andree Elk, LPN who asked that pt rest for now.  No new orders noted. PT checked back in after 40 min; pt sleeping.  Wife present.     Therapy Documentation Precautions:  Precautions Precautions: Fall Precaution Comments: LLE paresis more severe than in the LUE Restrictions Weight Bearing Restrictions: No  Pain: Pain Assessment Pain Scale: Faces Pain Score: 0-No pain    Therapy/Group: Individual Therapy  Maisa Bedingfield 01/21/2019, 1:55 PM

## 2019-01-22 ENCOUNTER — Inpatient Hospital Stay (HOSPITAL_COMMUNITY): Payer: BC Managed Care – PPO | Admitting: Physical Therapy

## 2019-01-22 ENCOUNTER — Inpatient Hospital Stay (HOSPITAL_COMMUNITY): Payer: BC Managed Care – PPO | Admitting: Occupational Therapy

## 2019-01-22 LAB — CREATININE, SERUM
Creatinine, Ser: 3.28 mg/dL — ABNORMAL HIGH (ref 0.61–1.24)
GFR calc Af Amer: 23 mL/min — ABNORMAL LOW (ref >60)
GFR calc non Af Amer: 20 mL/min — ABNORMAL LOW (ref >60)

## 2019-01-22 MED ORDER — VALPROATE SODIUM 500 MG/5ML IV SOLN
1000.0000 mg | Freq: Once | INTRAVENOUS | Status: AC
  Administered 2019-01-22: 16:00:00 1000 mg via INTRAVENOUS
  Filled 2019-01-22: qty 10

## 2019-01-22 MED ORDER — LEVETIRACETAM 500 MG PO TABS
500.0000 mg | ORAL_TABLET | Freq: Two times a day (BID) | ORAL | Status: DC
  Administered 2019-01-22 – 2019-02-04 (×26): 500 mg via ORAL
  Filled 2019-01-22 (×26): qty 1

## 2019-01-22 MED ORDER — DIVALPROEX SODIUM 250 MG PO DR TAB: 1000.0000 mg | DELAYED_RELEASE_TABLET | Freq: Once | ORAL | Status: DC

## 2019-01-22 MED ORDER — LORAZEPAM 2 MG/ML IJ SOLN
1.0000 mg | Freq: Once | INTRAMUSCULAR | Status: AC
  Administered 2019-01-22: 15:00:00 1 mg via INTRAVENOUS
  Filled 2019-01-22: qty 1

## 2019-01-22 MED ORDER — LEVETIRACETAM 750 MG PO TABS
750.0000 mg | ORAL_TABLET | Freq: Two times a day (BID) | ORAL | Status: DC
  Administered 2019-01-22: 750 mg via ORAL
  Filled 2019-01-22 (×2): qty 1

## 2019-01-22 MED ORDER — SODIUM CHLORIDE 0.9 % IV SOLN
INTRAVENOUS | Status: DC | PRN
  Administered 2019-01-22 – 2019-01-25 (×2): 250 mL via INTRAVENOUS
  Administered 2019-01-31: 09:00:00 via INTRAVENOUS

## 2019-01-22 MED ORDER — LORAZEPAM 0.5 MG PO TABS
0.5000 mg | ORAL_TABLET | Freq: Four times a day (QID) | ORAL | Status: DC | PRN
  Administered 2019-01-22 – 2019-01-25 (×3): 0.5 mg via ORAL
  Filled 2019-01-22 (×3): qty 1

## 2019-01-22 MED ORDER — DIVALPROEX SODIUM 250 MG PO DR TAB
750.0000 mg | DELAYED_RELEASE_TABLET | Freq: Two times a day (BID) | ORAL | Status: DC
  Administered 2019-01-22 – 2019-01-24 (×5): 750 mg via ORAL
  Filled 2019-01-22 (×5): qty 3

## 2019-01-22 NOTE — Progress Notes (Signed)
Patient with witnessed seizure while up with therapies affecting left side.  Contact made to neurology services Keppra was increased to 750 mg twice daily this morning advised to decrease back to twice daily at 500 mg due to creatinine of 3.28.  Patient is to receive 1 mg of Ativan as well as 1000 mg of Depakote x1 and then begin Depakote 500 mg twice daily.

## 2019-01-22 NOTE — Progress Notes (Signed)
Occupational Therapy Weekly Progress Note  Patient Details  Name: Jeremy Grant MRN: [id number] Date of Birth: 1961/04/25  Beginning of progress report period: January 16, 2019 End of progress report period: January 22, 2019  Today's Date: 01/22/2019 OT Individual Time: 1418-1440 OT Individual Time Calculation (min): 22 min    Patient has met 0 of 3 short term goals.  Jeremy Grant is making steady progress with OT at this time.  He currently completes UB selfcare in sitting wit supervision.  LB selfcare is currently min assist overall sit to stand.  He continues to demonstrate increased LLE weakness greater than LUE which require him to need use of the RW and AFO for all functional transfers at min assist.  He does exhibit good functional use of the LUE at a diminished level for selfcare tasks but proximal weakness is noted more in the scapula and shoulder compared to the elbow and hand.  He unfortunately continues to have seizures during some OT sessions, limiting his ability to participate in sessions as well.  Feel based on his current level of assist and progress that he is more likely to need 24 hour supervision initially.  Will continue working toward established LTGs of supervision with downgrade to some that were set at modified independent level given his frequency of seizures and his current need for consistent min assist.    Patient continues to demonstrate the following deficits: muscle weakness, impaired timing and sequencing, unbalanced muscle activation and decreased coordination and decreased standing balance, decreased postural control, hemiplegia and decreased balance strategies and therefore will continue to benefit from skilled OT intervention to enhance overall performance with BADL, iADL and Reduce care partner burden.  Patient not progressing toward long term goals.  See goal revision..  Continue plan of care.  OT Short Term Goals Week 2:  OT Short Term Goal 1 (Week  2): Continue working on established supervision level LTGs.  Skilled Therapeutic Interventions/Progress Updates:    Pt seen for OT treatment limited session secondary to seizure activity.  Prior to seizure occurrence, he was able to stand and ambulate to the dresser in his room with use of the RW for support and min assist to gather clothing to donn after shower.  Increased posterior LOB X1 with this task.  After gathering clothing, as he was stepping to the side, he reported not feeling well and therapist helped him sit down on the bed where he then leaned down to the right and began seizing with his body shaking and the LUE still firmly gripped on the hand of the walker.  Episode lasted for approximately one  minute.  Nursing and nurse tech came into the room and pt was then transitioned to supine in the bed and vitals were taken as listed.  Another seizure was noted again lasting for approximately 45 seconds with nursing and PA present.  Pt left in bed with nursing with pt missing 50 mins of OT secondary to seizure activity.    Therapy Documentation Precautions:  Precautions Precautions: Fall Precaution Comments: LLE paresis more severe than in the LUE Restrictions Weight Bearing Restrictions: No General: General OT Amount of Missed Time: 50 Minutes Vital Signs: Therapy Vitals Temp: 98.6 F (37 C) Temp Source: Oral Pulse Rate: 97 Resp: 20 BP: (!) 150/92 Patient Position (if appropriate): Lying Oxygen Therapy SpO2: 100 % O2 Device: Room Air Pain: Pain Assessment Pain Scale: Faces Pain Score: 0-No pain Pain Type: Acute pain Pain Location: Ankle Pain Orientation: Left Pain  Descriptors / Indicators: Aching Pain Frequency: Intermittent Pain Onset: Gradual Pain Intervention(s): Medication (See eMAR) Multiple Pain Sites: (medicated at 1244) ADL: See Care Tool Section for some details of ADL and mobility  Therapy/Group: Individual Therapy  , OTR/L 01/22/2019, 2:56  PM

## 2019-01-22 NOTE — Progress Notes (Signed)
Physical Therapy Session Note  Patient Details  Name: Jeremy Grant MRN: [id number] Date of Birth: October 27, 1960  Today's Date: 01/22/2019 PT Individual Time: 2297-9892 and 1194-1740 PT Individual Time Calculation (min): 72 min and 45 min  Short Term Goals: Week 1:  PT Short Term Goal 1 (Week 1): Pt will be able to perform bed mobility with CGA PT Short Term Goal 2 (Week 1): Pt will be able to perform functional transfers with CGA PT Short Term Goal 3 (Week 1): Pt will be able to gait x 100' with min assist PT Short Term Goal 4 (Week 1): Pt will be able to perform 4 stairs with rails with CGA  Skilled Therapeutic Interventions/Progress Updates:    Session 1: Spoke with Linna Hoff, [place], and confirmed pt is clear to participate in full therapy sessions. Confirmed with RN that pt has received seizure medication this AM prior to therapy session. Supine>sit, HOB partially elevated and using bedrails with supervision. Sitting EOB with CGA for sitting balance donned shorts with min assist and shoes with LLE AFO max assist for time management. Sit>stand with min assist for lifting/balance. Stand pivot to w/c, no AD, with min assist for balance and cuing for L LE stepping.  Transported to/from gym in w/c for energy conservation. Ambulated ~172ft using RW, wearing L LE AFO, with min assist and pt demonstrating improved L LE swing phase mechanics with no hip internal rotation or adduction noted; however, continues to demonstrate sustained L knee flexion during stance in order to avoid hyperextension. Therapist donned L knee hyperextension brace and pt ambulated ~110ft but then suddenly reported "feeling lightheaded and funny" therefore 2nd person provided w/c for seated rest break and vitals assessed: BP 176/96 (MAP 119), HR 86bpm. Patient then agreeable to continue therapy session responding that symptoms have dissipated. Ambulated ~38ft using RW wearing L LE AFO and knee hyperextension brace with pt continuing to  demonstrate sustained L knee flexion during stance that improved moderately in 2nd half of the walk, but pt reports that he doesn't like the knee hyperextension brace as it makes it more difficult for him to clear his L foot through during swing phase. Performed L LE NMR task of minisquats 2sets of 15 reps focusing on quadriceps and gluteal activation - mirror feedback and tactile/verbal cuing for muscle activation and L lateral weigh tshifting. Standing>tall kneeling on/off mat with mod assist for balance and L LE management as well as mod cuing for sequencing. Performed tall kneeling with B UE support on bench progressed to no UE support with focus on increased L glute activation and anterior facing hip rotation (as pt tends to rotate R) with manual facilitation and multimodal cuing for form and muscle activation and mirror feedback - progressed to repeated hip flexion to extension with focus on increased glute activation - min assist throughout for balance/trunk control. Transported back to room in w/c. Stand pivot w/c>EOB, no AD, with min assist for balance and cuing for L LE management and sequencing. Sit>supine with supervision. Pt left supine in bed with needs in reach and bed alarm on.  Session 2: Pt receive supine in bed and agreeable to therapy session. Supine>sit, HOB slightly elevated and using bedrails with supervision. Donned B LE shoes and L AFO max assist for time management. Stand pivot EOB>w/c using RW with min assist for balance.  Transported to/from gym in w/c for time management and energy conservation. Ambulated ~19ft using RW, wearing L LE AFO, with pt demonstrating increasing fatigue in L  LE with decreased foot clearance during swing as well as continued sustained L knee flexion during stance - min assist throughout for balance and max multimodal cuing for improved L LE gait mechanics. Ascended/descended 8 steps using bilateral handrails with reciprocal stepping pattern to target L LE NMR  - mod assist throughout for balance - pt continues to demonstrate posterior weight shift compensation when stepping L foot up onto step due to inability to sufficiently activate knee flexors - continues to demonstrate sustained L knee flexion during stance to step R LE up with compensation via increased B UE weightbearing - manual facilitation and max multimodal cuing for sequencing to increase L LE muscle activation. Attempted L LE NMR task of standing with B UE support on RW and mirror feedback while stepping L foot over cone on ground to target hip/knee flexor activation with pt compensating via hip circumduction or posterior weight shift and unable to activate knee flexors - attempted to modify exercise for improved pt success but unable. Transitioned to L LE NMR task targeting knee extension control with mirror feedback, no UE support, and max multimodal cuing and mod assist for balance while tapping R LE up/down on cone - performed mini-squat between each rep to increase L glute/quad muscle activation for improved knee control - pt with L lateral lean/LOB with cuing and assist for correction. Stand pivot EOM>w/c using RW with min assist for balance. Transported back to room in w/c and pt left sitting in w/c with needs in reach and seat belt alarm on.  Therapy Documentation Precautions:  Precautions Precautions: Fall Precaution Comments: LLE paresis more severe than in the LUE Restrictions Weight Bearing Restrictions: No  Pain:   Session 1: Reports minimal L hip pain during tall kneeling task but denies intervention and this does not limit participation.   Session 2: Denies pain during session.   Therapy/Group: Individual Therapy  Tawana Scale, PT, DPT 01/22/2019, 7:46 AM

## 2019-01-22 NOTE — Plan of Care (Signed)
  Problem: RH Balance Goal: LTG: Patient will maintain dynamic sitting balance (OT) Description: LTG:  Patient will maintain dynamic sitting balance with assistance during activities of daily living (OT) Flowsheets (Taken 01/22/2019 1524) LTG: Pt will maintain dynamic sitting balance during ADLs with: (Goal downgraded based on slower progress.) Supervision/Verbal cueing   Problem: Sit to Stand Goal: LTG:  Patient will perform sit to stand in prep for activites of daily living with assistance level (OT) Description: LTG:  Patient will perform sit to stand in prep for activites of daily living with assistance level (OT) Flowsheets (Taken 01/22/2019 1524) LTG: PT will perform sit to stand in prep for activites of daily living with assistance level: (Goal downgraded based on slower progress than expected.) Supervision/Verbal cueing   Problem: RH Dressing Goal: LTG Patient will perform lower body dressing w/assist (OT) Description: LTG: Patient will perform lower body dressing with assist, with/without cues in positioning using equipment (OT) Flowsheets (Taken 01/22/2019 1524) LTG: Pt will perform lower body dressing with assistance level of: (Goal downgraded based on slower progress.) Supervision/Verbal cueing   Problem: RH Toileting Goal: LTG Patient will perform toileting task (3/3 steps) with assistance level (OT) Description: LTG: Patient will perform toileting task (3/3 steps) with assistance level (OT)  Flowsheets (Taken 01/22/2019 1524) LTG: Pt will perform toileting task (3/3 steps) with assistance level: (Goal downgraded secondary to slower progress.) Supervision/Verbal cueing   Problem: RH Simple Meal Prep Goal: LTG Patient will perform simple meal prep w/assist (OT) Description: LTG: Patient will perform simple meal prep with assistance, with/without cues (OT). Flowsheets (Taken 01/22/2019 1524) LTG: Pt will perform simple meal prep with assistance level of: (Goal downgraded based on  progress) Supervision/Verbal cueing   Problem: RH Toilet Transfers Goal: LTG Patient will perform toilet transfers w/assist (OT) Description: LTG: Patient will perform toilet transfers with assist, with/without cues using equipment (OT) Flowsheets (Taken 01/22/2019 1524) LTG: Pt will perform toilet transfers with assistance level of: (Goals downgraded based on progress) Supervision/Verbal cueing   Problem: RH Tub/Shower Transfers Goal: LTG Patient will perform tub/shower transfers w/assist (OT) Description: LTG: Patient will perform tub/shower transfers with assist, with/without cues using equipment (OT) Flowsheets (Taken 01/22/2019 1524) LTG: Pt will perform tub/shower stall transfers with assistance level of: (Goals downgraded based on progress.) Supervision/Verbal cueing LTG: Pt will perform tub/shower transfers from: Tub/shower combination

## 2019-01-22 NOTE — Progress Notes (Signed)
[facility] PHYSICAL MEDICINE & REHABILITATION PROGRESS NOTE   Subjective/Complaints:  "small seizure this am"  Pt states he was reaching for his glasses with Left hand and the arm started to shake for a short time.  No weakness of the left arm this am.  No incont episode. No leg shaking   ROS- denies CP, SOB, N/V/D Objective:   Ct Head Wo Contrast  Result Date: 01/20/2019 CLINICAL DATA:  58 year old with nonischemic cardiomyopathy, combined systolic and diastolic heart failure, presenting with new onset seizures. EXAM: CT HEAD WITHOUT CONTRAST TECHNIQUE: Contiguous axial images were obtained from the base of the skull through the vertex without intravenous contrast. COMPARISON:  MRI brain 01/11/2019. No prior CT head. 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 No associated hemorrhage or hematoma. No mass effect or midline shift. No extra-axial fluid collections. No new/acute intracranial abnormalities. Ventricular system normal in size and appearance for age. No mass lesion. Vascular: Moderate BILATERAL carotid siphon and severe BILATERAL vertebral artery atherosclerosis. No hyperdense vessel. Skull: No skull fracture or other focal osseous abnormality involving the skull. Sinuses/Orbits: Visualized paranasal sinuses, bilateral mastoid air cells and bilateral middle ear cavities well-aerated. Visualized orbits and globes normal in appearance. Other: None. IMPRESSION: 1. 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 No associated hemorrhage or hematoma. 2. No new/acute intracranial abnormality. Electronically Signed   By: Evangeline Dakin M.D.   On: 01/20/2019 19:11   Recent Labs    01/21/19 0527  WBC 3.6*  HGB 7.4*  HCT 20.3*   PLT 110*   Recent Labs    01/22/19 0516  CREATININE 3.28*    Intake/Output Summary (Last 24 hours) at 01/22/2019 0737 Last data filed at 01/22/2019 0651 Gross per 24 hour  Intake 720 ml  Output 1000 ml  Net -280 ml     Physical Exam: Vital Signs Blood pressure (!) 165/108, pulse 81, temperature 97.8 F (36.6 C), temperature source Oral, resp. rate 18, height 6\' 1"  (1.854 m), weight 62.6 kg, SpO2 100 %.  General: No acute distress Sitting up in manual w/c in room, watching TV, appropriate, hand on stomach intermittently, NAD Mood and affect are appropriate Heart:RRR no rubs or murmurs Lungs: Clear to auscultation B/L Abdomen: hyperactive bowel sounds, soft, nontender to palpation, nondistended Extremities: No clubbing, cyanosis, or edema Skin: No evidence of breakdown, no evidence of rash Neurologic: Cranial nerves II through XII intact, motor strength is 5/5 inright 4- Left  deltoid, bicep, tricep, grip,2- Left  hip flexor, knee extensors,0/5 Left  ankle dorsiflexor and plantar flexor, 5/5 RLE Sensory exam normal sensation to light touch and proprioception in bilateral upper and lower extremities Cerebellar exam normal finger to nose to finger as well as heel to shin in bilateral upper and lower extremities TOne MAS 2 at Left hamstrings Musculoskeletal: Full range of motion in all 4 extremities. No joint swelling    Assessment/Plan: 1. 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  Physiatrist is providing close team supervision and 24 hour management of active medical problems listed below.  Physiatrist and rehab team continue to assess barriers to discharge/monitor patient progress toward functional and medical goals  Care  Tool:  Bathing    Body parts bathed by patient: Right arm, Left arm, Chest, Abdomen, Right upper leg, Left upper leg, Right lower leg,  Left lower leg, Face, Front perineal area, Buttocks   Body parts bathed by helper: Front perineal area, Buttocks     Bathing assist Assist Level: Minimal Assistance - Patient > 75%     Upper Body Dressing/Undressing Upper body dressing   What is the patient wearing?: Pull over shirt    Upper body assist Assist Level: Set up assist    Lower Body Dressing/Undressing Lower body dressing      What is the patient wearing?: Pants, Underwear/pull up     Lower body assist Assist for lower body dressing: Minimal Assistance - Patient > 75%     Toileting Toileting    Toileting assist Assist for toileting: Independent with assistive device Assistive Device Comment: urinal   Transfers Chair/bed transfer  Transfers assist     Chair/bed transfer assist level: Minimal Assistance - Patient > 75%     Locomotion Ambulation   Ambulation assist      Assist level: Minimal Assistance - Patient > 75% Assistive device: Walker-rolling Max distance: 12'   Walk 10 feet activity   Assist     Assist level: Minimal Assistance - Patient > 75% Assistive device: Walker-rolling   Walk 50 feet activity   Assist    Assist level: Minimal Assistance - Patient > 75% Assistive device: Walker-rolling    Walk 150 feet activity   Assist Walk 150 feet activity did not occur: Safety/medical concerns         Walk 10 feet on uneven surface  activity   Assist Walk 10 feet on uneven surfaces activity did not occur: Safety/medical concerns         Wheelchair     Assist Will patient use wheelchair at discharge?: No Type of Wheelchair: Manual    Wheelchair assist level: Supervision/Verbal cueing Max wheelchair distance: 100 ft    Wheelchair 50 feet with 2 turns activity    Assist            Wheelchair 150 feet activity     Assist          Blood pressure (!) 165/108, pulse 81, temperature 97.8 F (36.6 C), temperature source Oral, resp. rate 18,  height 6\' 1"  (1.854 m), weight 62.6 kg, SpO2 100 %.    Medical Problem List and Plan: 1.  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  Plan 30-day cardiac event monitor as outpatienttent d/c 9/19 2.  Antithrombotics: -DVT/anticoagulation: Lovenox             -antiplatelet therapy: Aspirin 325 mg, Plavix 75 mg daily x3 weeks then aspirin alone 3. Pain Management: Tylenol as needed 4. Mood: Provide emotional support             -antipsychotic agents: N/A 5. Neuropsych: This patient is capable of making decisions on his own behalf. 6. Skin/Wound Care: Routine skin checks 7. Fluids/Electrolytes/Nutrition: Routine in and outs with follow-up chemistries 8.  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  Monitor for any signs of fluid overload 9.  Hypertension. Permissive, but starting to tighten control   Vitals:   01/21/19 1924 01/22/19 0356  BP: (!) 155/91 (!) 165/108  Pulse: 75 81  Resp: 16 18  Temp: 97.9 F (36.6 C) 97.8 F (36.6 C)  SpO2: 100% 100%  on  amlodipine 2.5mg  need to adjust dose increase to 5mg   10.  ANCA necrotizing concentric glomerulonephritis.  Continue Cytoxan as well as chronic prednisone.  Follow-up per renal services.  Admission creatinine of 2.95 11.  Pancytopenia/chronic anemia.  Follow-up labs patient had been receiving Aranesp as an outpatient with renal services. 12.  Hyperlipidemia.  Lipitor 13.  History of alcohol use.  Counseling and monitor. 14. Concern for spasticity formation- will con't to monitor- since has elevated DTRs on LLE, is at risk for spasticity development. 15. Diarrhea/Bowel issues/Nausea  9/6- will give Zofran prn 4 mg q8 hours prn and monitor 16. Dispo- expect 7-10 days, but will determine per team conference 17.  Flexor spasms LLE add tizanidine qhs- reassured pt that sei  18.   Seizure left-sided post stroke.   Todd's paralysis left upper extremityresolved .  CT of the head showed expected evolution .  As discussed with Dr. Erlinda Hong,  [place]  500 twice daily mwill be increased to 750mg  due to probable brief breakthrough seizure    Negative  EEG. LOS: 7 days A FACE TO FACE EVALUATION WAS PERFORMED  Charlett Blake 01/22/2019, 7:37 AM

## 2019-01-23 ENCOUNTER — Inpatient Hospital Stay (HOSPITAL_COMMUNITY): Payer: BC Managed Care – PPO | Admitting: Physical Therapy

## 2019-01-23 ENCOUNTER — Inpatient Hospital Stay (HOSPITAL_COMMUNITY): Payer: BC Managed Care – PPO | Admitting: Occupational Therapy

## 2019-01-23 LAB — CBC
HCT: 19.8 % — ABNORMAL LOW (ref 39.0–52.0)
Hemoglobin: 7.3 g/dL — ABNORMAL LOW (ref 13.0–17.0)
MCH: 36.7 pg — ABNORMAL HIGH (ref 26.0–34.0)
MCHC: 36.9 g/dL — ABNORMAL HIGH (ref 30.0–36.0)
MCV: 99.5 fL (ref 80.0–100.0)
Platelets: 119 10*3/uL — ABNORMAL LOW (ref 150–400)
RBC: 1.99 MIL/uL — ABNORMAL LOW (ref 4.22–5.81)
RDW: 13.3 % (ref 11.5–15.5)
WBC: 3.4 10*3/uL — ABNORMAL LOW (ref 4.0–10.5)
nRBC: 0 % (ref 0.0–0.2)

## 2019-01-23 LAB — METHYLMALONIC ACID, SERUM: Methylmalonic Acid, Quantitative: 521 nmol/L — ABNORMAL HIGH (ref 0–378)

## 2019-01-23 MED ORDER — AMLODIPINE BESYLATE 5 MG PO TABS
5.0000 mg | ORAL_TABLET | Freq: Every day | ORAL | Status: DC
  Administered 2019-01-23 – 2019-02-01 (×10): 5 mg via ORAL
  Filled 2019-01-23 (×5): qty 1
  Filled 2019-01-23: qty 2
  Filled 2019-01-23 (×4): qty 1

## 2019-01-23 NOTE — Progress Notes (Signed)
Occupational Therapy Session Note  Patient Details  Name: Jeremy Grant MRN: [id number] Date of Birth: 12/08/1960  Today's Date: 01/23/2019 OT Individual Time: 0315-9458 OT Individual Time Calculation (min): 72 min    Short Term Goals: Week 2:  OT Short Term Goal 1 (Week 2): Continue working on established supervision level LTGs.  Skilled Therapeutic Interventions/Progress Updates:    Patient in bed, alert, pleasant and cooperative.  Assisted patient to seated position at edge of bed - seizure activity lasting for 40 seconds occurred, patient supported t/o seizure to avoid injury, he was able to state that he was having a seizure at the onset.  He was assisted to supine position, nursing notified.  No change in mental status or motor control following seizure activity.  Patient requested self care and agreed to completing at the bed level to ensure safety.  He proceeded to have 2 more seizures both lasting 45 seconds within the next 15 minutes during which time the nurse administered medications.  Patient able to complete oral care with set up, bathing with min A, UB dressing with mod A, LB dressing with mod A.  Completed supine UE AROM/AAROM and coordination activities with increased time and min A.  Provided activities to complete on his own to assist with left coordination recovery.  No further seizure activity during session.  Patient agreeable to sitting edge of bed with min A to work on trunk/posture and seated balance with good results, returned to bed min A.  Patient left with bed rails up, call bell in place and bed alarm set.    Therapy Documentation Precautions:  Precautions Precautions: Fall Precaution Comments: LLE paresis more severe than in the LUE Restrictions Weight Bearing Restrictions: No General:   Vital Signs:  Pain: Pain Assessment Pain Scale: 0-10 Pain Score: 0-No pain   Other Treatments:     Therapy/Group: Individual Therapy  Jeremy Grant 01/23/2019,  12:03 PM

## 2019-01-23 NOTE — Progress Notes (Signed)
Chaplain stopped in to pray with pt. Pt was eating lunch and declined prayer. Chaplain apologized for interrupting, and wished pt a good day. Chaplain remains available per request.  Chaplain Resident, Evelene Croon, M Div Pager # [phone]

## 2019-01-23 NOTE — Progress Notes (Signed)
Physical Therapy Weekly Progress Note  Patient Details  Name: Jeremy Grant MRN: [id number] Date of Birth: 11/05/1960  Beginning of progress report period: January 16, 2019 End of progress report period: January 23, 2019  Today's Date: 01/23/2019 PT Individual Time: 5681-2751 and 1307-1400 PT Individual Time Calculation (min): 61 min and 53 min  Patient has met 2 of 4 short term goals. Patient had been progressing well with therapy then had onset of new seizures starting on 01/20/2019 that resulted in temporary paralysis followed by generalized weakness and fatigue impacting pt's ability to participate fully in higher level therapeutic activities. Patient continues to be able to perform bed mobility with supervision, sit<>stands with CGA, bed<>chair transfers with min assist, and ambulate up to 156f using RW with min assist and mod assist on the days after having a seizures due to increased L LE weakness/fatigue.   Patient continues to demonstrate the following deficits muscle weakness and muscle paralysis, decreased cardiorespiratoy endurance, impaired timing and sequencing, abnormal tone, unbalanced muscle activation, decreased coordination and decreased motor planning and decreased sitting balance, decreased standing balance, decreased postural control and decreased balance strategies and therefore will continue to benefit from skilled PT intervention to increase functional independence with mobility.  Patient progressing slowly towards long term goals with variation in presentation due to new seizure activity. Goals revised due to this.  Continue plan of care.  PT Short Term Goals Week 1:  PT Short Term Goal 1 (Week 1): Pt will be able to perform bed mobility with CGA PT Short Term Goal 1 - Progress (Week 1): Met PT Short Term Goal 2 (Week 1): Pt will be able to perform functional transfers with CGA PT Short Term Goal 2 - Progress (Week 1): Progressing toward goal PT Short Term Goal 3  (Week 1): Pt will be able to gait x 100' with min assist PT Short Term Goal 3 - Progress (Week 1): Met PT Short Term Goal 4 (Week 1): Pt will be able to perform 4 stairs with rails with CGA PT Short Term Goal 4 - Progress (Week 1): Progressing toward goal Week 2:  PT Short Term Goal 1 (Week 2): = to LTGs based on ELOS  Skilled Therapeutic Interventions/Progress Updates:  Ambulation/gait training;Balance/vestibular training;Community reintegration;Discharge planning;Disease management/prevention;DME/adaptive equipment instruction;Functional electrical stimulation;Functional mobility training;Neuromuscular re-education;Pain management;Patient/family education;Psychosocial support;Skin care/wound management;Splinting/orthotics;Stair training;Therapeutic Activities;Therapeutic Exercise;UE/LE Strength taining/ROM;UE/LE Coordination activities;Wheelchair propulsion/positioning;Visual/perceptual remediation/compensation  Session 1: OT informed therapist of pt's seizures during her AM session. Spoke with RN and confirmed pt was clear to participate in therapy session without restrictions - Dan, PUtah present later during session and confirmed no restrictions on therapy. Pt received supine in bed with his wife present and pt agreeable to therapy session. Supine>sit, HOB partially elevated, with supervision. Donned B shoes and L LE AFO max assist for time management. Stand pivot EOB>w/c using RW with CGA/min assist for balance and cuing for sequencing.  Transported to/from gym in w/c for energy conservation. Ambulated ~157fusing RW with min assist and intermittent light mod assist for balance as pt demonstrating increased overall fatigue and weakness resulting in poor gait mechanics of decreased L LE foot clearance and intermittently not pulling it through, sustained knee flexion during stance with increased bend, increased reliance on B UE support, and requiring increased assist for balance/safety - cuing throughout  for improved gait mechanics. Performed L LE NMR task of standing, no UE support, with mirror feedback while performing mini-squats targeting L quadriceps muscle activation/control to perform terminal extension  in standing without hyperextension - therapist blocking hyperextension - min assist for balance/lifting/lowering - progressed to activating quad muscle followed by stepping R LE forward/backwards with pt demonstrating muscle fatigue and inability to sustain quad contraction. Pt requires increased frequency of seated rest breaks this session due to fatigue. Supine<>sit on mat with min assist for trunk control and L LE management. Transitioned to supine L LE NMR tasks of heel slides for hip/knee flexion while controlling hip abduction/adduction/internal rotation/external rotation with manual facilitation and multimodal cuing 2 x 15 reps then short arc quads with pt demonstrating ability to initiate movement but required max assist to perform fully. Squat pivot EOM>w/c with min assist for pivoting hips. Transported back to room. Squat pivot w/c>EOB with mod assist for pivoting hips. Sit>supine with min assist for L LE management. Pt left supine in bed with needs in reach, bed alarm on, and his wife present.  Session 2: Pt received supine in bed with his wife present and pt agreeable to therapy session. No report of seizures between PT sessions and pt appears to have more energy and feel better this session. Gerald Stabs, orthotist, present for consult regarding L LE AFO. Supine>sit with CGA for trunk steadying/safety. Donned B shoes and L AFO max assist for time management. Sit>stand EOB>RW with CGA for steadying. Ambulated ~136f using RW with CGA/min assist for balance - demonstrates improved gait mechanics compared to this AM but continues to have poor L LE foot clearance with intermittent toe drag, lack of knee extension during final phase of swing, and either sustained L knee flexion or knee hyperextension during  stance phase - cuing throughout for improved gait mechanics. Patient, pt's wife, therapist, and orthotist discussed AFO options and decided to have L shoe toe cap placed as well as try sprystep AFO that will assist with preventing hyperextension while providing improved ankle DF assist. Transported to day room in w/c for energy conservation. Stand pivot w/c<>Nustep, no AD, with min/mod assist for pivoting hips and balance. Performed  BLE NMR on Nustep against level 2 resistance targeting increased L LE muscle activation 3 minutes x 2 (rest break between) intermittent manual facilitation to initiate L LE knee/hip extension to push pedal. B UE w/c propulsion ~1543ftotal with 3 prolonged rest breaks throughout with pt requiring intermittent min/mod assist for correction due to pt continuing to veer L due to inadequate L UE propulsion compared to R UE - cuing and hand-over-hand facilitation for improved LUE w/c propulsion technique with pt demonstrating weak proximal shoulder muscles. Stand pivot w/c>EOB, no AD, with min/mod assist for balance. Sit>supine with supervision and increased time for L LE management. Pt left supine in bed with needs in reach, bed alarm on, and pt's wife present.    Therapy Documentation Precautions:  Precautions Precautions: Fall Precaution Comments: LLE paresis more severe than in the LUE Restrictions Weight Bearing Restrictions: No  Pain: Session 1: Denies pain during session.  Session 2: Denies pain during session.  Therapy/Group: Individual Therapy  CaTawana ScalePT, DPT 01/23/2019, 11:59 AM

## 2019-01-23 NOTE — Progress Notes (Signed)
[place] PHYSICAL MEDICINE & REHABILITATION PROGRESS NOTE   Subjective/Complaints:  Seizure yesterday , did not lose consciousness, no increased weakness  ROS- denies CP, SOB, N/V/D Objective:   No results found. Recent Labs    01/21/19 0527 01/23/19 0548  WBC 3.6* 3.4*  HGB 7.4* 7.3*  HCT 20.3* 19.8*  PLT 110* 119*   Recent Labs    01/22/19 0516  CREATININE 3.28*    Intake/Output Summary (Last 24 hours) at 01/23/2019 0741 Last data filed at 01/23/2019 0500 Gross per 24 hour  Intake 851.24 ml  Output 1700 ml  Net -848.76 ml     Physical Exam: Vital Signs Blood pressure (!) 163/88, pulse 73, temperature 98.6 F (37 C), resp. rate 19, height 6\' 1"  (1.854 m), weight 63 kg, SpO2 100 %.  General: No acute distress Sitting up in manual w/c in room, watching TV, appropriate, hand on stomach intermittently, NAD Mood and affect are appropriate Heart:RRR no rubs or murmurs Lungs: Clear to auscultation B/L Abdomen: hyperactive bowel sounds, soft, nontender to palpation, nondistended Extremities: No clubbing, cyanosis, or edema Skin: No evidence of breakdown, no evidence of rash Neurologic: Cranial nerves II through XII intact, motor strength is 5/5 inright 4- Left  deltoid, bicep, tricep, grip,2- Left  hip flexor, knee extensors,0/5 Left  ankle dorsiflexor and plantar flexor, 5/5 RLE Sensory exam normal sensation to light touch and proprioception in bilateral upper and lower extremities Cerebellar exam normal finger to nose to finger as well as heel to shin in bilateral upper and lower extremities TOne MAS 2 at Left hamstrings Musculoskeletal: Full range of motion in all 4 extremities. No joint swelling    Assessment/Plan: 1. 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  Physiatrist is providing close team supervision and 24 hour management of active medical  problems listed below.  Physiatrist and rehab team continue to assess barriers to discharge/monitor patient progress toward functional and medical goals  Care Tool:  Bathing    Body parts bathed by patient: Right arm, Left arm, Chest, Abdomen, Right upper leg, Left upper leg, Right lower leg, Left lower leg, Face, Front perineal area, Buttocks   Body parts bathed by helper: Front perineal area, Buttocks     Bathing assist Assist Level: Minimal Assistance - Patient > 75%     Upper Body Dressing/Undressing Upper body dressing   What is the patient wearing?: Pull over shirt    Upper body assist Assist Level: Set up assist    Lower Body Dressing/Undressing Lower body dressing      What is the patient wearing?: Pants, Underwear/pull up     Lower body assist Assist for lower body dressing: Minimal Assistance - Patient > 75%     Toileting Toileting    Toileting assist Assist for toileting: Independent with assistive device Assistive Device Comment: urinal   Transfers Chair/bed transfer  Transfers assist     Chair/bed transfer assist level: Minimal Assistance - Patient > 75%     Locomotion Ambulation   Ambulation assist      Assist level: Minimal Assistance - Patient > 75% Assistive device: Walker-rolling Max distance: 8'   Walk 10 feet activity   Assist     Assist level: Minimal Assistance - Patient > 75% Assistive device: Walker-rolling   Walk 50 feet activity   Assist    Assist level: Minimal Assistance - Patient > 75% Assistive device: Walker-rolling  Walk 150 feet activity   Assist Walk 150 feet activity did not occur: Safety/medical concerns  Assist level: Minimal Assistance - Patient > 75% Assistive device: Walker-rolling    Walk 10 feet on uneven surface  activity   Assist Walk 10 feet on uneven surfaces activity did not occur: Safety/medical concerns         Wheelchair     Assist Will patient use wheelchair at  discharge?: No Type of Wheelchair: Manual    Wheelchair assist level: Supervision/Verbal cueing Max wheelchair distance: 100 ft    Wheelchair 50 feet with 2 turns activity    Assist            Wheelchair 150 feet activity     Assist          Blood pressure (!) 163/88, pulse 73, temperature 98.6 F (37 C), resp. rate 19, height 6\' 1"  (1.854 m), weight 63 kg, SpO2 100 %.    Medical Problem List and Plan: 1.  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  Plan 30-day cardiac event monitor as outpatienttent d/c 9/19 2.  Antithrombotics: -DVT/anticoagulation: Lovenox             -antiplatelet therapy: Aspirin 325 mg, Plavix 75 mg daily x3 weeks then aspirin alone 3. Pain Management: Tylenol as needed 4. Mood: Provide emotional support             -antipsychotic agents: N/A 5. Neuropsych: This patient is capable of making decisions on his own behalf. 6. Skin/Wound Care: Routine skin checks 7. Fluids/Electrolytes/Nutrition: Routine in and outs with follow-up chemistries 8.  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  Monitor for any signs of fluid overload 9.  Hypertension. Permissive, but starting to tighten control   Vitals:   01/22/19 1935 01/23/19 0523  BP: (!) 154/83 (!) 163/88  Pulse: 71 73  Resp: 17 19  Temp: 97.8 F (36.6 C) 98.6 F (37 C)  SpO2: 100% 100%  on  amlodipine 2.5mg  need to adjust dose increase to 5mg   10.  ANCA necrotizing concentric glomerulonephritis.  Continue Cytoxan as well as chronic prednisone.  Follow-up per renal services.  Admission creatinine of 2.95 11.  Pancytopenia/chronic anemia.  Follow-up labs patient had been receiving Aranesp as an outpatient with renal services. 12.  Hyperlipidemia.  Lipitor 13.  History of alcohol use.  Counseling and monitor. 14. Concern for  spasticity formation- will con't to monitor- since has elevated DTRs on LLE, is at risk for spasticity development. 15. Diarrhea/Bowel issues/Nausea  9/6- will give Zofran prn 4 mg q8 hours prn and monitor 16. Dispo- expect 7-10 days, but will determine per team conference 17.  Flexor spasms LLE add tizanidine qhs- reassured pt that sei  18.  Seizure left-sided post stroke.   Todd's paralysis left upper extremityresolved .  CT of the head showed expected evolution . Per Neuro rec changed seizure prophyllaxis to Valproate 750mg  BID with Keppra 500mg  BID.  Had a 1000mg  IV valproate load    Negative  EEG. LOS: 8 days A FACE TO FACE EVALUATION WAS PERFORMED  Charlett Blake 01/23/2019, 7:41 AM

## 2019-01-23 NOTE — Plan of Care (Signed)
  Problem: RH Balance Goal: LTG Patient will maintain dynamic standing balance (PT) Description: LTG:  Patient will maintain dynamic standing balance with assistance during mobility activities (PT) Flowsheets (Taken 01/23/2019 1230) LTG: Pt will maintain dynamic standing balance during mobility activities with:: (downgraded based on progress and new onset seizures) Contact Guard/Touching assist Note: downgraded based on progress and new onset seizures   Problem: Sit to Stand Goal: LTG:  Patient will perform sit to stand with assistance level (PT) Description: LTG:  Patient will perform sit to stand with assistance level (PT) Flowsheets (Taken 01/23/2019 1230) LTG: PT will perform sit to stand in preparation for functional mobility with assistance level: (downgraded based on progress and new onset seizures) Supervision/Verbal cueing Note: downgraded based on progress and new onset seizures   Problem: RH Ambulation Goal: LTG Patient will ambulate in controlled environment (PT) Description: LTG: Patient will ambulate in a controlled environment, # of feet with assistance (PT). Flowsheets (Taken 01/23/2019 1230) LTG: Pt will ambulate in controlled environ  assist needed:: (downgraded based on progress and new onset seizures) Contact Guard/Touching assist LTG: Ambulation distance in controlled environment: 140ft Note: downgraded based on progress and new onset seizures Goal: LTG Patient will ambulate in home environment (PT) Description: LTG: Patient will ambulate in home environment, # of feet with assistance (PT). Flowsheets (Taken 01/23/2019 12 Problem: RH Stairs Goal: LTG Patient will ambulate up and down stairs w/assist (PT) Description: LTG: Patient will ambulate up and down # of stairs with assistance (PT) Flowsheets (Taken 01/23/2019 1230) LTG: Pt will ambulate up/down stairs assist needed:: (downgraded based on progress and new onset seizures) Contact Guard/Touching assist LTG: Pt will   ambulate up and down number of stairs: 3 stairs with both rails for home entry Note: downgraded based on progress and new onset seizures   Problem: RH Wheelchair Mobility Goal: LTG Patient will propel w/c in controlled environment (PT) Description: LTG: Patient will propel wheelchair in controlled environment, # of feet with assist (PT) Flowsheets (Taken 01/23/2019 1237) LTG: Pt will propel w/c in controlled environ  assist needed:: (added goal to allow pt increased independence at home) Independent with assistive device LTG: Propel w/c distance in controlled environment: 175ft Note: added goal to allow pt increased independence at home Goal: LTG Patient will propel w/c in home environment (PT) Description: LTG: Patient will propel wheelchair in home environment, # of feet with assistance (PT). Flowsheets (Taken 01/23/2019 1237) LTG: Pt will propel w/c in home environ  assist needed:: (added goal to allow pt increased independence at home) Independent with assistive device LTG: Propel w/c distance in home environment: 20ft Note: added goal to allow pt increased independence at home  30) LTG: Pt will ambulate in home environ  assist needed:: (downgraded based on progress and new onset seizures) Contact Guard/Touching assist LTG: Ambulation distance in home environment: 14ft Note: downgraded based on progress and new onset seizures

## 2019-01-24 ENCOUNTER — Inpatient Hospital Stay (HOSPITAL_COMMUNITY): Payer: BC Managed Care – PPO | Admitting: Physical Therapy

## 2019-01-24 ENCOUNTER — Inpatient Hospital Stay (HOSPITAL_COMMUNITY): Payer: BC Managed Care – PPO | Admitting: Occupational Therapy

## 2019-01-24 LAB — VALPROIC ACID LEVEL: Valproic Acid Lvl: 25 ug/mL — ABNORMAL LOW (ref 50.0–100.0)

## 2019-01-24 LAB — OCCULT BLOOD X 1 CARD TO LAB, STOOL: Fecal Occult Bld: POSITIVE — AB

## 2019-01-24 NOTE — Progress Notes (Addendum)
[place] PHYSICAL MEDICINE & REHABILITATION PROGRESS NOTE   Subjective/Complaints:  Last seizure yesterday afternoon no residual weakness  ROS- denies CP, SOB, N/V/D Objective:   No results found. Recent Labs    01/23/19 0548  WBC 3.4*  HGB 7.3*  HCT 19.8*  PLT 119*   Recent Labs    01/22/19 0516  CREATININE 3.28*    Intake/Output Summary (Last 24 hours) at 01/24/2019 0905 Last data filed at 01/24/2019 0831 Gross per 24 hour  Intake 760 ml  Output 1300 ml  Net -540 ml     Physical Exam: Vital Signs Blood pressure (!) 185/75, pulse 68, temperature 97.8 F (36.6 C), temperature source Oral, resp. rate 16, height 6\' 1"  (1.854 m), weight 64.2 kg, SpO2 100 %.  General: No acute distress Sitting up in manual w/c in room, watching TV, appropriate, hand on stomach intermittently, NAD Mood and affect are appropriate Heart:RRR no rubs or murmurs Lungs: Clear to auscultation B/L Abdomen: hyperactive bowel sounds, soft, nontender to palpation, nondistended Extremities: No clubbing, cyanosis, or edema Skin: No evidence of breakdown, no evidence of rash Neurologic: Cranial nerves II through XII intact, motor strength is 5/5 inright 4- Left  deltoid, bicep, tricep, grip,2- Left  hip flexor, knee extensors,0/5 Left  ankle dorsiflexor and plantar flexor, 5/5 RLE Sensory exam normal sensation to light touch and proprioception in bilateral upper and lower extremities Cerebellar exam normal finger to nose to finger as well as heel to shin in bilateral upper and lower extremities TOne MAS 2 at Left hamstrings Musculoskeletal: Full range of motion in all 4 extremities. No joint swelling    Assessment/Plan: 1. 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  Physiatrist is providing close team supervision and 24 hour management of active medical problems listed  below.  Physiatrist and rehab team continue to assess barriers to discharge/monitor patient progress toward functional and medical goals  Care Tool:  Bathing    Body parts bathed by patient: Right arm, Left arm, Chest, Abdomen, Right upper leg, Left upper leg, Right lower leg, Left lower leg, Face, Front perineal area, Buttocks   Body parts bathed by helper: Front perineal area, Buttocks     Bathing assist Assist Level: Minimal Assistance - Patient > 75%     Upper Body Dressing/Undressing Upper body dressing   What is the patient wearing?: Pull over shirt    Upper body assist Assist Level: Moderate Assistance - Patient 50 - 74%    Lower Body Dressing/Undressing Lower body dressing      What is the patient wearing?: Underwear/pull up, Pants     Lower body assist Assist for lower body dressing: Moderate Assistance - Patient 50 - 74%     Toileting Toileting    Toileting assist Assist for toileting: Independent with assistive device Assistive Device Comment: urinal   Transfers Chair/bed transfer  Transfers assist     Chair/bed transfer assist level: Minimal Assistance - Patient > 75%     Locomotion Ambulation   Ambulation assist      Assist level: Minimal Assistance - Patient > 75% Assistive device: Walker-rolling Max distance: 125ft   Walk 10 feet activity   Assist     Assist level: Minimal Assistance - Patient > 75% Assistive device: Walker-rolling   Walk 50 feet activity   Assist    Assist level: Minimal Assistance - Patient > 75% Assistive device: Walker-rolling  Walk 150 feet activity   Assist Walk 150 feet activity did not occur: Safety/medical concerns  Assist level: Minimal Assistance - Patient > 75% Assistive device: Walker-rolling    Walk 10 feet on uneven surface  activity   Assist Walk 10 feet on uneven surfaces activity did not occur: Safety/medical concerns         Wheelchair     Assist Will patient use  wheelchair at discharge?: Yes Type of Wheelchair: Manual    Wheelchair assist level: Minimal Assistance - Patient > 75% Max wheelchair distance: 169ft    Wheelchair 50 feet with 2 turns activity    Assist        Assist Level: Minimal Assistance - Patient > 75%   Wheelchair 150 feet activity     Assist      Assist Level: Minimal Assistance - Patient > 75%   Blood pressure (!) 185/75, pulse 68, temperature 97.8 F (36.6 C), temperature source Oral, resp. rate 16, height 6\' 1"  (1.854 m), weight 64.2 kg, SpO2 100 %.    Medical Problem List and Plan: 1.  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  Plan 30-day cardiac event monitor as outpatient tent d/c 9/19 2.  Antithrombotics: -DVT/anticoagulation: Lovenox             -antiplatelet therapy: Aspirin 325 mg, Plavix 75 mg daily x3 weeks then aspirin alone 3. Pain Management: Tylenol as needed 4. Mood: Provide emotional support             -antipsychotic agents: N/A 5. Neuropsych: This patient is capable of making decisions on his own behalf. 6. Skin/Wound Care: Routine skin checks 7. Fluids/Electrolytes/Nutrition: Routine in and outs with follow-up chemistries 8.  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  Monitor for any signs of fluid overload 9.  Hypertension. Permissive, but starting to tighten control   Vitals:   01/24/19 0422 01/24/19 0800  BP: (!) 158/73 (!) 185/75  Pulse: 68   Resp: 16   Temp: 97.8 F (36.6 C)   SpO2: 100%   on  amlodipine 2.5mg  need to adjust dose increase to 5mg  - monitor for 24h prior to another increase  10.  ANCA necrotizing concentric glomerulonephritis.  Continue Cytoxan as well as chronic prednisone.  Follow-up per renal services.  Admission creatinine of 2.95 11.  Pancytopenia/chronic anemia.  Follow-up labs patient had  been receiving Aranesp as an outpatient with renal services. 12.  Hyperlipidemia.  Lipitor 13.  History of alcohol use.  Counseling and monitor. 14. Concern for spasticity formation- will con't to monitor- since has elevated DTRs on LLE, is at risk for spasticity development. 15. Diarrhea/Bowel issues/Nausea  9/6- will give Zofran prn 4 mg q8 hours prn and monitor 16. Dispo- home with wife and 24/7 sup 17.  Flexor spasms LLE add tizanidine qhs- reassured pt that sei  18.  Seizure left-sided post stroke.   Todd's paralysis left upper extremityresolved .  CT of the head showed expected evolution . Per Neuro rec changed seizure prophyllaxis to Valproate 750mg  BID with Keppra 500mg  BID.  Had a 1000mg  IV valproate load    Negative  EEG.-As discussed with neurology will get a stat level for valproate and if on the low side may need to increase valproic acid dose.  The patient is also not having generalized seizures but short duration partial seizures which should be relatively well-tolerated.  If the valproic acid level is on the high side of the normal range, may need to add Vimpat, discussed with Dr. Leonie Man lOS: 9 days A FACE TO [place] E Macai Sisneros 01/24/2019, 9:05 AM

## 2019-01-24 NOTE — Progress Notes (Signed)
Occupational Therapy Session Note  Patient Details  Name: Jeremy Grant MRN: [id number] Date of Birth: [birth year]  Today's Date: 01/24/2019 OT Individual Time: 1312-1405 OT Individual Time Calculation (min): 53 min    Short Term Goals: Week 2:  OT Short Term Goal 1 (Week 2): Continue working on established supervision level LTGs.  Skilled Therapeutic Interventions/Progress Updates:    Treatment session with focus on dynamic sitting and standing during self-care tasks and LUE NMR.  Pt received semi-reclined in bed reporting fatigue post seizure activity but agreeable to therapy in room.  RN cleared pt to participate as no new orders from MD.  Pt completed LB dressing seated EOB with min assist when donning Lt shoe due to AFO and steadying assist when standing to adjust shorts over hips.  Ambulated to sink with RW with min assist.  Engaged in oral care in standing with cues for weight shifting to increase WB through LLE.  Engaged in reaching post oral care with focus on weight shifting through LLE to further increase WB.  Engaged in shaving task in sitting for energy conservation.  Discussed Lt shoulder weakness with sustained overhead reaching.  Engaged in fine motor control with in-hand manipulation and translation with good control with Lt hand.  Frequent rest breaks provided due to fatigue.  Therapy Documentation Precautions:  Precautions Precautions: Fall Precaution Comments: LLE paresis more severe than in the LUE Restrictions Weight Bearing Restrictions: No General:   Vital Signs: Therapy Vitals Pulse Rate: 82 Resp: 20 BP: (!) 150/71 Oxygen Therapy SpO2: 100 % Pain:  Pt with no c/o pain   Therapy/Group: Individual Therapy  Simonne Come 01/24/2019, 3:10 PM

## 2019-01-24 NOTE — Progress Notes (Signed)
Physical Therapy Session Note  Patient Details  Name: Jeremy Grant MRN: [id number] Date of Birth: 30-Jul-1960  Today's Date: 01/24/2019 PT Individual Time: 0806-0919 PT Individual Time Calculation (min): 73 min   Short Term Goals: Week 2:  PT Short Term Goal 1 (Week 2): = to LTGs based on ELOS  Skilled Therapeutic Interventions/Progress Updates:    RN reports having just administered pt's seizure and BP medications as well as reporting pt's BP is elevated more than normal this AM. Pt received supine in bed and agreeable to therapy session. Pt now has leather toe cap on L shoe. Supine>sit, HOB slightly elevated, with CGA for trunk safety. Sitting EOB with close supervision donned shorts with min assist for threading LEs and max assist for shoes. Sit<>stands throughout session with CGA/min assist for steadying/balance. Stand pivot EOB>w/c, no AD, with min/mod assist for balance and pivoting hips. Assessed vitals: BP 182/84 (MAP 112), HR 85bpm. B UE w/c propulsion ~117ft with close supervision and mod cuing as pt continues to veer L requiring pt frequently only propelling with L UE in order to go straight forward. B UE w/c navigation around cones focusing on improved w/c navigation with pt frequently bumping into walls or the cones - cuing throughout for improved technique when turning. Reassessed vitals: BP 161/99 (MAP 119), HR 79bpm. Pt able to set-up w/c for stand pivot transfer to EOM with minimal cuing. Stand pivot w/c>EOM using RW with CGA for steadying and min/mod cuing for sequencing of LE stepping and AD management. Ambulated 78ft, 174ft using RW with CGA for steadying and intermittent min assist if pt unable to clear L foot through fully during swing - continues to demonstrate decreased L foot clearance with intermittent toe drag during swing phase (improved with toe cap) and impaired knee control during stance phase resulting in either sustained knee flexion or snapping back into  hyperextension - mod cuing for improved gait mechanics and intermittent manual facilitation to prevent hyperextension. Ambulated ~68ft weaving in/out of cones using RW to progress towards home simulated gait with primarily CGA and intermittent min assist when not stepping adequately with L LE. Pt suddenly reporting "feeling funny" with concern for possible seizure - provided long seated rest break then transitioned to supine exercises. Performed supine bridging 2x15 reps with L LE bias and min assist for knee position to avoid it falling in/out. Stand pivot EOM>w/c using RW with CGA/min assist. Transported back to room in w/c. Stand pivot w/c>EOB using RW with CGA/min assist. Sit>supine with supervision. Pt left supine in bed with needs in reach and bed alarm on.  Therapy Documentation Precautions:  Precautions Precautions: Fall Precaution Comments: LLE paresis more severe than in the LUE Restrictions Weight Bearing Restrictions: No  Pain:   Denies pain during session.   Therapy/Group: Individual Therapy  Tawana Scale, PT, DPT 01/24/2019, 7:51 AM

## 2019-01-24 NOTE — Progress Notes (Signed)
At approx 1140, Patient called nurse's station because felt a seizure coming on. NT responded immediately and noted seizure of 11 second duration. Approximately 5 minutes later, this RN noted second seizure of 32 second duration. Post-vitals within normal, patient did not lose consciousness, alert and oriented following, "just tired." MD notified and continue to monitor.

## 2019-01-24 NOTE — Progress Notes (Signed)
At 1547 patient called, this RN arrived to room as seizure was finishing, possibly 15 seconds. Second seizure of 45 second duration observed. Patient remained alert and oriented, fatigued. Vital signs taken. No other seizures during this shift. Reported to oncoming RN.

## 2019-01-25 ENCOUNTER — Inpatient Hospital Stay (HOSPITAL_COMMUNITY): Payer: BC Managed Care – PPO | Admitting: Physical Therapy

## 2019-01-25 MED ORDER — VALPROATE SODIUM 500 MG/5ML IV SOLN
500.0000 mg | Freq: Once | INTRAVENOUS | Status: AC
  Administered 2019-01-25: 500 mg via INTRAVENOUS
  Filled 2019-01-25: qty 5

## 2019-01-25 MED ORDER — DIVALPROEX SODIUM 250 MG PO DR TAB
1000.0000 mg | DELAYED_RELEASE_TABLET | Freq: Two times a day (BID) | ORAL | Status: DC
  Administered 2019-01-25 – 2019-02-04 (×21): 1000 mg via ORAL
  Filled 2019-01-25 (×21): qty 4

## 2019-01-25 NOTE — Progress Notes (Signed)
Patient called staff to the room stating he was about to have a seizure. Upon entering the seizure had stopped. About 5 minutes after while still assessing the patient he began to seize again. This even lasted about 45 secs and there was no loss of consciousness. The patient returned to baseline immediately after with no fatigue. V/S taken. MD kirstein called, message left for call back. Reported to oncoming nurse.

## 2019-01-25 NOTE — Progress Notes (Signed)
Patient had a seizure at about 9:20pm that lasted less than a minute, staff at bedside during event. No loss of consciousness and patient returned to baseline immediately after. No fatigue observed. V/S taken. Will continue to monitor.

## 2019-01-25 NOTE — Progress Notes (Signed)
[place] PHYSICAL MEDICINE & REHABILITATION PROGRESS NOTE   Subjective/Complaints:  Discussed difference between generalized and partial seizures with pt and his wife.  SHe had concernes about being on multiple meds, also financial concerns as STD running out.  Wife has concerns about bruising   ROS- denies CP, SOB, N/V/D Objective:   No results found. Recent Labs    01/23/19 0548  WBC 3.4*  HGB 7.3*  HCT 19.8*  PLT 119*   No results for input(s): NA, K, CL, CO2, GLUCOSE, BUN, CREATININE, CALCIUM in the last 72 hours.  Intake/Output Summary (Last 24 hours) at 01/25/2019 0801 Last data filed at 01/25/2019 0731 Gross per 24 hour  Intake 680 ml  Output 1800 ml  Net -1120 ml     Physical Exam: Vital Signs Blood pressure (!) 158/82, pulse 82, temperature 97.6 F (36.4 C), temperature source Oral, resp. rate 18, height 6\' 1"  (1.854 m), weight 64.2 kg, SpO2 100 %.  General: No acute distress Sitting up in manual w/c in room, watching TV, appropriate, hand on stomach intermittently, NAD Mood and affect are appropriate Heart:RRR no rubs or murmurs Lungs: Clear to auscultation B/L Abdomen: hyperactive bowel sounds, soft, nontender to palpation, nondistended Extremities: No clubbing, cyanosis, or edema Skin: No evidence of breakdown, no evidence of rash Neurologic: Cranial nerves II through XII intact, motor strength is 5/5 inright 4- Left  deltoid, bicep, tricep, grip,2- Left  hip flexor, knee extensors,0/5 Left  ankle dorsiflexor and plantar flexor, 5/5 RLE Sensory exam normal sensation to light touch and proprioception in bilateral upper and lower extremities Cerebellar exam normal finger to nose to finger as well as heel to shin in bilateral upper and lower extremities TOne MAS 2 at Left hamstrings Musculoskeletal: Full range of motion in all 4 extremities. No joint swelling    Assessment/Plan: 1. Functional deficits secondary to RIght ACA infarct  which require 3+ hours  per day of interdisciplinary therapy in a comprehensive inpatient rehab setting.  Physiatrist is providing close team supervision and 24 hour management of active medical problems listed below.  Physiatrist and rehab team continue to assess barriers to discharge/monitor patient progress toward functional and medical goals  Care Tool:  Bathing    Body parts bathed by patient: Right arm, Left arm, Chest, Abdomen, Right upper leg, Left upper leg, Right lower leg, Left lower leg, Face, Front perineal area, Buttocks   Body parts bathed by helper: Front perineal area, Buttocks     Bathing assist Assist Level: Minimal Assistance - Patient > 75%     Upper Body Dressing/Undressing Upper body dressing   What is the patient wearing?: Pull over shirt    Upper body assist Assist Level: Moderate Assistance - Patient 50 - 74%    Lower Body Dressing/Undressing Lower body dressing      What is the patient wearing?: Underwear/pull up, Pants     Lower body assist Assist for lower body dressing: Moderate Assistance - Patient 50 - 74%     Toileting Toileting    Toileting assist Assist for toileting: Independent with assistive device Assistive Device Comment: urinal   Transfers Chair/bed transfer  Transfers assist     Chair/bed transfer assist level: Minimal Assistance - Patient > 75%     Locomotion Ambulation   Ambulation assist      Assist level: Minimal Assistance - Patient > 75% Assistive device: Walker-rolling Max distance: 1104ft   Walk 10 feet activity   Assist     Assist level: Minimal  Assistance - Patient > 75% Assistive device: Walker-rolling   Walk 50 feet activity   Assist    Assist level: Minimal Assistance - Patient > 75% Assistive device: Walker-rolling    Walk 150 feet activity   Assist Walk 150 feet activity did not occur: Safety/medical concerns  Assist level: Minimal Assistance - Patient > 75% Assistive device: Walker-rolling    Walk  10 feet on uneven surface  activity   Assist Walk 10 feet on uneven surfaces activity did not occur: Safety/medical concerns         Wheelchair     Assist Will patient use wheelchair at discharge?: Yes Type of Wheelchair: Manual    Wheelchair assist level: Set up assist, Minimal Assistance - Patient > 75% Max wheelchair distance: 155ft    Wheelchair 50 feet with 2 turns activity    Assist        Assist Level: Set up assist, Minimal Assistance - Patient > 75%   Wheelchair 150 feet activity     Assist      Assist Level: Minimal Assistance - Patient > 75%   Blood pressure (!) 158/82, pulse 82, temperature 97.6 F (36.4 C), temperature source Oral, resp. rate 18, height 6\' 1"  (1.854 m), weight 64.2 kg, SpO2 100 %.    Medical Problem List and Plan: 1.  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  Plan 30-day cardiac event monitor as outpatient tent d/c 9/19 2.  Antithrombotics: -DVT/anticoagulation:  plt on low side with bruising will stop enoxaparin             -antiplatelet therapy: Aspirin 325 mg, Plavix 75 mg daily x3 weeks then aspirin alone 3. Pain Management: Tylenol as needed 4. Mood: Provide emotional support             -antipsychotic agents: N/A 5. Neuropsych: This patient is capable of making decisions on his own behalf. 6. Skin/Wound Care: Routine skin checks 7. Fluids/Electrolytes/Nutrition: Routine in and outs with follow-up chemistries 8.  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  Monitor for any signs of fluid overload 9.  Hypertension. Permissive, but starting to tighten control   Vitals:   01/25/19 0628 01/25/19 0753  BP: (!) 153/96 (!) 158/82  Pulse: 82   Resp: 18   Temp:    SpO2: 100%   on  amlodipine 2.5mg  need to adjust dose increase to 5mg  -improving will hold off on  another increase for now  10.  ANCA necrotizing concentric glomerulonephritis.  Continue Cytoxan as well as chronic prednisone.  Follow-up per renal services.  Admission creatinine of 2.95 11.  Pancytopenia/chronic anemia.  Follow-up labs patient had been receiving Aranesp as an outpatient with renal services. 12.  Hyperlipidemia.  Lipitor 13.  History of alcohol use.  Counseling and monitor. 14. Concern for spasticity formation- will con't to monitor- since has elevated DTRs on LLE, is at risk for spasticity development. 15. Diarrhea/Bowel issues/Nausea  9/6- will give Zofran prn 4 mg q8 hours prn and monitor 16. Dispo- home with wife and 24/7 sup 17.  Flexor spasms LLE add tizanidine qhs- reassured pt that sei  18.  Seizure left-sided post stroke.   Todd's paralysis left upper extremityresolved .  CT of the head showed expected evolution . Per Neuro rec changed seizure prophyllaxis to Valproate 750mg  BID with Keppra 500mg  BID.  Had a 1000mg  IV valproate load  Negative  EEG.-As discussed with neurology will get a stat level for valproate and if on the low side may need to increase valproic acid dose.  The patient is also not having generalized seizures but short duration partial seizures which should be relatively well-tolerated.  If the valproic acid level is on the high side of the normal range, may need to add Vimpat, discussed with Dr. Leonie Man Has had only partial seizures without resultant Todd's paralysis- VPA level low increase to 1000mg  BID, give IV VPA bolus of 500mg  , check VPA level 9/15  lOS: 10 days A FACE TO FACE EVALUATION WAS PERFORMED  Charlett Blake 01/25/2019, 8:01 AM

## 2019-01-25 NOTE — Progress Notes (Signed)
Physical Therapy Session Note  Patient Details  Name: Jeremy Grant MRN: [id number] Date of Birth: August 24, 1960  Today's Date: 01/25/2019 PT Individual Time: 0900-1000 PT Individual Time Calculation (min): 60 min   Short Term Goals: Week 1:  PT Short Term Goal 1 (Week 1): Pt will be able to perform bed mobility with CGA PT Short Term Goal 1 - Progress (Week 1): Met PT Short Term Goal 2 (Week 1): Pt will be able to perform functional transfers with CGA PT Short Term Goal 2 - Progress (Week 1): Progressing toward goal PT Short Term Goal 3 (Week 1): Pt will be able to gait x 100' with min assist PT Short Term Goal 3 - Progress (Week 1): Met PT Short Term Goal 4 (Week 1): Pt will be able to perform 4 stairs with rails with CGA PT Short Term Goal 4 - Progress (Week 1): Progressing toward goal  Skilled Therapeutic Interventions/Progress Updates:  Pt was seen bedside in the am. Pt had to 2 seizures this am, cleared by MD to participate with therapy. Pt transferred supine to edge of bed with c/g. Pt transferred sit to stand to c/g and rolling walker. Pt required assistance to pull up shorts. Pt ambulated about 5 feet with rolling walker and min A with L AFO. Pt propelled w/c about 150 feet with min A and verbal cues. Pt performed multiple sit to stand transfers with c/g and verbal cues with rolling walker. Pt performed blocked practice 3 sets x 5 reps each sit to stand transfers. Pt performed step taps with 2 lbs on L LE for increased proprioceptive feedback, 3 sets x 10 reps each. Pt ambulated 80 feet x 3 with rolling walker and min A with verbal cues. Pt returned to room. Pt transferred w/c to edge of bed with min to mod A, no assistive device and verbal cues. Pt transferred to supine with S. Pt left sitting up in bed with wife at bedside and bed alarm on with call bell within reach.   Therapy Documentation Precautions:  Precautions Precautions: Fall Precaution Comments: LLE paresis more severe  than in the LUE Restrictions Weight Bearing Restrictions: No General:   Pain: No c/o pain.   Therapy/Group: Individual Therapy  Dub Amis 01/25/2019, 12:09 PM

## 2019-01-25 NOTE — Progress Notes (Signed)
Patient's spouse called to alert RN to seizure that started while patient was napping, approximately 30 second duration. Patient did not lose consciousness. Reports fatigue vital signs as recorded, within patient's norm. Continue to monitor.

## 2019-01-26 ENCOUNTER — Inpatient Hospital Stay (HOSPITAL_COMMUNITY): Payer: BC Managed Care – PPO | Admitting: Occupational Therapy

## 2019-01-26 ENCOUNTER — Encounter (HOSPITAL_COMMUNITY): Payer: BC Managed Care – PPO

## 2019-01-26 ENCOUNTER — Inpatient Hospital Stay (HOSPITAL_COMMUNITY): Payer: BC Managed Care – PPO

## 2019-01-26 ENCOUNTER — Telehealth: Payer: Self-pay | Admitting: *Deleted

## 2019-01-26 LAB — CBC
HCT: 16.9 % — ABNORMAL LOW (ref 39.0–52.0)
Hemoglobin: 5.9 g/dL — CL (ref 13.0–17.0)
MCH: 35.8 pg — ABNORMAL HIGH (ref 26.0–34.0)
MCHC: 34.9 g/dL (ref 30.0–36.0)
MCV: 102.4 fL — ABNORMAL HIGH (ref 80.0–100.0)
Platelets: 129 10*3/uL — ABNORMAL LOW (ref 150–400)
RBC: 1.65 MIL/uL — ABNORMAL LOW (ref 4.22–5.81)
RDW: 13.2 % (ref 11.5–15.5)
WBC: 4.9 10*3/uL (ref 4.0–10.5)
nRBC: 0 % (ref 0.0–0.2)

## 2019-01-26 LAB — BASIC METABOLIC PANEL
Anion gap: 10 (ref 5–15)
BUN: 67 mg/dL — ABNORMAL HIGH (ref 6–20)
CO2: 21 mmol/L — ABNORMAL LOW (ref 22–32)
Calcium: 8.8 mg/dL — ABNORMAL LOW (ref 8.9–10.3)
Chloride: 110 mmol/L (ref 98–111)
Creatinine, Ser: 2.85 mg/dL — ABNORMAL HIGH (ref 0.61–1.24)
GFR calc Af Amer: 27 mL/min — ABNORMAL LOW (ref >60)
GFR calc non Af Amer: 23 mL/min — ABNORMAL LOW (ref >60)
Glucose, Bld: 190 mg/dL — ABNORMAL HIGH (ref 70–99)
Potassium: 3.9 mmol/L (ref 3.5–5.1)
Sodium: 141 mmol/L (ref 135–145)

## 2019-01-26 LAB — CBC WITH DIFFERENTIAL/PLATELET
Abs Immature Granulocytes: 0.05 10*3/uL (ref 0.00–0.07)
Basophils Absolute: 0 10*3/uL (ref 0.0–0.1)
Basophils Relative: 1 %
Eosinophils Absolute: 0.1 10*3/uL (ref 0.0–0.5)
Eosinophils Relative: 2 %
HCT: 18.6 % — ABNORMAL LOW (ref 39.0–52.0)
Hemoglobin: 6.5 g/dL — CL (ref 13.0–17.0)
Immature Granulocytes: 1 %
Lymphocytes Relative: 9 %
Lymphs Abs: 0.4 10*3/uL — ABNORMAL LOW (ref 0.7–4.0)
MCH: 35.5 pg — ABNORMAL HIGH (ref 26.0–34.0)
MCHC: 34.9 g/dL (ref 30.0–36.0)
MCV: 101.6 fL — ABNORMAL HIGH (ref 80.0–100.0)
Monocytes Absolute: 0.3 10*3/uL (ref 0.1–1.0)
Monocytes Relative: 8 %
Neutro Abs: 3.4 10*3/uL (ref 1.7–7.7)
Neutrophils Relative %: 79 %
Platelets: 129 10*3/uL — ABNORMAL LOW (ref 150–400)
RBC: 1.83 MIL/uL — ABNORMAL LOW (ref 4.22–5.81)
RDW: 13.3 % (ref 11.5–15.5)
WBC: 4.2 10*3/uL (ref 4.0–10.5)
nRBC: 0 % (ref 0.0–0.2)

## 2019-01-26 LAB — PREPARE RBC (CROSSMATCH)

## 2019-01-26 MED ORDER — SODIUM CHLORIDE 0.9% IV SOLUTION
Freq: Once | INTRAVENOUS | Status: AC
  Administered 2019-01-26: 14:00:00 via INTRAVENOUS

## 2019-01-26 NOTE — Progress Notes (Signed)
Physical Therapy Session Note  Patient Details  Name: Jeremy Grant MRN: [id number] Date of Birth: December 01, 1960  Today's Date: 01/26/2019 PT Individual Time: 8453-6468 PT Individual Time Calculation (min): 25 min  and Today's Date: 01/26/2019 PT Missed Time: 35 Minutes Missed Time Reason: Other (Comment);Patient fatigue(medical- low hemoglobin level)  Short Term Goals: Week 2:  PT Short Term Goal 1 (Week 2): = to LTGs based on ELOS  Skilled Therapeutic Interventions/Progress Updates:    Pt supine in bed upon PT arrival, pt reports he is about to receive a blood transfusion, hemoglobin level of 5.9. Pt's wife present during session. Pt agreeable to get up and try going to the bathroom. Orthotist also present to provide pt with and trial a different AFO this session. Donned AFO and shoes with assist. Pt ambulated 2 x 8 ft from bed<>toilet with RW and min assist, cues for safety and step length. Pt reports that this AFO is more comfortable, he reports just feeling weak today. Pt performed clothing management with min assist, pericare without assist, continent of bowel. After ambulating back to bed pt reports feeling very tired, deferred continued therapy secondary to low heoglobin and RN present to set up transfusion. Will follow up this afternoon as time allows.  Pt missed 35 minutes of skilled therapy tx secondary to low hemoglobin.    Therapy Documentation Precautions:  Precautions Precautions: Fall Precaution Comments: LLE paresis more severe than in the LUE Restrictions Weight Bearing Restrictions: No    Therapy/Group: Individual Therapy  Netta Corrigan, PT, DPT 01/26/2019, 8:00 AM

## 2019-01-26 NOTE — Progress Notes (Signed)
[place] PHYSICAL MEDICINE & REHABILITATION PROGRESS NOTE   Subjective/Complaints:  Pt reports no seizures since yesterday- when has seizures, says no LOC- so partial seizures.    ROS- denies CP, SOB, N/V/D Objective:   No results found. Recent Labs    01/26/19 0745 01/26/19 1017  WBC 4.2 4.9  HGB 6.5* 5.9*  HCT 18.6* 16.9*  PLT 129* 129*   Recent Labs    01/26/19 0745  NA 141  K 3.9  CL 110  CO2 21*  GLUCOSE 190*  BUN 67*  CREATININE 2.85*  CALCIUM 8.8*    Intake/Output Summary (Last 24 hours) at 01/26/2019 1223 Last data filed at 01/26/2019 0931 Gross per 24 hour  Intake 535 ml  Output 1100 ml  Net -565 ml     Physical Exam: Vital Signs Blood pressure (!) 154/78, pulse 77, temperature 98.3 F (36.8 C), temperature source Oral, resp. rate 15, height 6\' 1"  (1.854 m), weight 64.1 kg, SpO2 100 %.  General: No acute distress Sitting up in manual w/c in room, watching TV, appropriate, NAD;  Mood and affect are appropriate Pale conjunctiva B/L Heart:RRR no rubs or murmurs Lungs: Clear to auscultation B/L Abdomen: hyperactive bowel sounds, soft, nontender to palpation, nondistended Extremities: No clubbing, cyanosis, or edema Skin: No evidence of breakdown, no evidence of rash Neurologic: Cranial nerves II through XII intact, motor strength is 5/5 inright 4- Left  deltoid, bicep, tricep, grip,2- Left  hip flexor, knee extensors,0/5 Left  ankle dorsiflexor and plantar flexor, 5/5 RLE Sensory exam normal sensation to light touch and proprioception in bilateral upper and lower extremities Cerebellar exam normal finger to nose to finger as well as heel to shin in bilateral upper and lower extremities Tone MAS 2 at Left hamstrings Musculoskeletal: Full range of motion in all 4 extremities. No joint swelling    Assessment/Plan: 1. Functional deficits secondary to RIght ACA infarct  which require 3+ hours per day of interdisciplinary therapy in a comprehensive  inpatient rehab setting.  Physiatrist is providing close team supervision and 24 hour management of active medical problems listed below.  Physiatrist and rehab team continue to assess barriers to discharge/monitor patient progress toward functional and medical goals  Care Tool:  Bathing  Bathing activity did not occur: Refused Body parts bathed by patient: Right arm, Left arm, Chest, Abdomen, Right upper leg, Left upper leg, Right lower leg, Left lower leg, Face, Front perineal area, Buttocks   Body parts bathed by helper: Front perineal area, Buttocks     Bathing assist Assist Level: Minimal Assistance - Patient > 75%     Upper Body Dressing/Undressing Upper body dressing   What is the patient wearing?: Pull over shirt    Upper body assist Assist Level: Supervision/Verbal cueing    Lower Body Dressing/Undressing Lower body dressing      What is the patient wearing?: Underwear/pull up, Pants     Lower body assist Assist for lower body dressing: Moderate Assistance - Patient 50 - 74%     Toileting Toileting    Toileting assist Assist for toileting: Independent with assistive device Assistive Device Comment: urinal   Transfers Chair/bed transfer  Transfers assist     Chair/bed transfer assist level: Minimal Assistance - Patient > 75%     Locomotion Ambulation   Ambulation assist      Assist level: Minimal Assistance - Patient > 75% Assistive device: Walker-rolling Max distance: 80   Walk 10 feet activity   Assist  Assist level: Minimal Assistance - Patient > 75% Assistive device: Walker-rolling   Walk 50 feet activity   Assist    Assist level: Contact Guard/Touching assist Assistive device: Walker-rolling    Walk 150 feet activity   Assist Walk 150 feet activity did not occur: Safety/medical concerns  Assist level: Minimal Assistance - Patient > 75% Assistive device: Walker-rolling    Walk 10 feet on uneven surface   activity   Assist Walk 10 feet on uneven surfaces activity did not occur: Safety/medical concerns         Wheelchair     Assist Will patient use wheelchair at discharge?: Yes Type of Wheelchair: Manual    Wheelchair assist level: Minimal Assistance - Patient > 75% Max wheelchair distance: 150    Wheelchair 50 feet with 2 turns activity    Assist        Assist Level: Minimal Assistance - Patient > 75%   Wheelchair 150 feet activity     Assist      Assist Level: Minimal Assistance - Patient > 75%   Blood pressure (!) 154/78, pulse 77, temperature 98.3 F (36.8 C), temperature source Oral, resp. rate 15, height 6\' 1"  (1.854 m), weight 64.1 kg, SpO2 100 %.    Medical Problem List and Plan: 1.  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  Plan 30-day cardiac event monitor as outpatient tent d/c 9/19 2.  Antithrombotics: -DVT/anticoagulation:  plt on low side with bruising will stop enoxaparin             -antiplatelet therapy: Aspirin 325 mg, Plavix 75 mg daily x3 weeks then aspirin alone 3. Pain Management: Tylenol as needed 4. Mood: Provide emotional support             -antipsychotic agents: N/A 5. Neuropsych: This patient is capable of making decisions on his own behalf. 6. Skin/Wound Care: Routine skin checks 7. Fluids/Electrolytes/Nutrition: Routine in and outs with follow-up chemistries 8.  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  Monitor for any signs of fluid overload 9.  Hypertension. Permissive, but starting to tighten control   Vitals:   01/25/19 1947 01/26/19 0507  BP: (!) 158/81 (!) 154/78  Pulse: 81 77  Resp: 16 15  Temp: 98.4 F (36.9 C) 98.3 F (36.8 C)  SpO2: 99% 100%  on  amlodipine 2.5mg  need to adjust dose increase to 5mg  -improving will hold off on another increase  for now  10.  ANCA necrotizing concentric glomerulonephritis.  Continue Cytoxan as well as chronic prednisone.  Follow-up per renal services.  Admission creatinine of 2.95  -Cr baseline at 2.85- BUN up a little  at 67- is a little dry 11.  Pancytopenia/chronic anemia.  Follow-up labs patient had been receiving Aranesp as an outpatient with renal services.  - Hb 6.5- followup 5.9- will transfuse 1 unit pRBCs- and probably will need another 12.  Hyperlipidemia.  Lipitor 13.  History of alcohol use.  Counseling and monitor. 14. Concern for spasticity formation- will con't to monitor- since has elevated DTRs on LLE, is at risk for spasticity development. 15. Diarrhea/Bowel issues/Nausea  9/6- will give Zofran prn 4 mg q8 hours prn and monitor 16. Dispo- home with wife and 24/7 sup 17.  Flexor spasms LLE add tizanidine qhs- reassured pt that sei  18.  Seizure left-sided post stroke.   Todd's paralysis left upper extremityresolved .  CT of the head showed expected evolution . Per Neuro rec changed seizure prophyllaxis to Valproate 750mg  BID with Keppra 500mg  BID.  Had a 1000mg  IV valproate load    Negative  EEG.-As discussed with neurology will get a stat level for valproate and if on the low side may need to increase valproic acid dose.  The patient is also not having generalized seizures but short duration partial seizures which should be relatively well-tolerated.  If the valproic acid level is on the high side of the normal range, may need to add Vimpat, discussed with Dr. Leonie Man Has had only partial seizures without resultant Todd's paralysis- VPA level low increase to 1000mg  BID, give IV VPA bolus of 500mg  , check VPA level 9/15  lOS: 11 days A FACE TO FACE EVALUATION WAS PERFORMED  Verlene Glantz 01/26/2019, 12:23 PM

## 2019-01-26 NOTE — Telephone Encounter (Signed)
Patient status, inpatient in rehab.  Wife upset, patient has had set back.  Is due to be discharged from rehab 01/31/19.  Please confirm before calling back.

## 2019-01-26 NOTE — Significant Event (Signed)
CRITICAL VALUE ALERT  Critical Value:  Hgb 6.5  Date & Time Notied:  01/26/2019 0948  Provider Notified: Linna Hoff Anguilli-PA  Orders Received/Actions taken: no new orders

## 2019-01-26 NOTE — Progress Notes (Signed)
Follow-up CBC this morning hemoglobin 6.5.  Patient is generally with hemoglobin 7.3-7.8 with noted chronic anemia.  He is asymptomatic.  Plan to transfuse 1 unit packed red blood cells.

## 2019-01-26 NOTE — Progress Notes (Signed)
Occupational Therapy Session Note  Patient Details  Name: Jeremy Grant MRN: [id number] Date of Birth: Apr 03, 1961  Today's Date: 01/26/2019 OT Individual Time: 6283-6629 OT Individual Time Calculation (min): 30 min    Short Term Goals: Week 2:  OT Short Term Goal 1 (Week 2): Continue working on established supervision level LTGs.  Skilled Therapeutic Interventions/Progress Updates:    Treatment session with focus on sitting and standing balance during self-care tasks.  Pt received supine in bed reporting no seizure overnight.  Pt completed bed mobility with min assist and min assist for sitting balance when donning shoes.  Pt able to don Lt shoe with AFO with setup to obtain figure 4 position and min-mod assist for dynamic sitting balance due to LOB to Lt with difficulty correcting.  Pt completed oral care in standing with manual facilitation and tactile cues for upright posture to facilitate WB through LLE and increased midline posture.  Engaged in reaching task in PNF pattern to reach to cabinet across midline and then outside BOS with LUE to further facilitate weight shift and scapular strengthening with overhead reaching.  Pt returned to bed min assist stand pivot and left supine with all needs in reach.  Therapy Documentation Precautions:  Precautions Precautions: Fall Precaution Comments: LLE paresis more severe than in the LUE Restrictions Weight Bearing Restrictions: No General:   Vital Signs: Therapy Vitals Temp: 98.3 F (36.8 C) Temp Source: Oral Pulse Rate: 77 Resp: 15 BP: (!) 154/78 Patient Position (if appropriate): Lying Oxygen Therapy SpO2: 100 % O2 Device: Room Air Pain: Pain Assessment Pain Scale: 0-10 Pain Score: 0-No pain   Therapy/Group: Individual Therapy  Simonne Come 01/26/2019, 8:42 AM

## 2019-01-26 NOTE — Progress Notes (Signed)
Occupational Therapy Note  Patient Details  Name: Jeremy Grant MRN: [id number] Date of Birth: 09/02/1960  Today's Date: 01/26/2019 OT Missed Time: 81 Minutes Missed Time Reason: Nursing care  Pt missed 30 mins scheduled therapy session due to RN care.  RN starting blood transfusion due to low hemoglobin.  Will continue to follow as pt stability and availability allows.    Simonne Come 01/26/2019, 2:34 PM

## 2019-01-26 NOTE — Progress Notes (Signed)
Occupational Therapy Session Note  Patient Details  Name: Jeremy Grant MRN: [id number] Date of Birth: 1961-05-05  Today's Date: 01/26/2019 OT Individual Time: 1045-1140 OT Individual Time Calculation (min): 55 min  and Today's Date: 01/26/2019 OT Missed Time: 20 Minutes Missed Time Reason: Patient fatigue   Short Term Goals: Week 2:  OT Short Term Goal 1 (Week 2): Continue working on established supervision level LTGs.  Skilled Therapeutic Interventions/Progress Updates:    Pt seen for OT session focusing on functional mobility, ADL re-training and UE strengthening. Pt in supine upon arrival, reports awaiting blood transfusion, willing to participate as able, however, complaints of great fatigue. RN gave clearance to cont with therapy to pt tolerance.   Transfers: Supine>sitting EOB with CGA. Min A squat pivot transfer EOB<> w/c with VCs for technique and proper set-up of equipment.   ADL re-training: Donned R shoe seated EOB with close supervision. Max A for donning L shoe/ADFO.  UB dressing w/c level with set-up. Stood at sink with  Min A and blocking of L knee while pt applied shaving cream to face. Returned to w/c to shave mod I.   UE strengthening: Completed x3 sets of 10 of the following exercises using #3 dowel rod with assist for stabilization at L elbow and qrist to facilitate full ROM and controlled movements. Rest breaks required throughout. Chest press Overhead press Bicep curl.   Pt then requesting to return back to bed 2/2 fatigue. Stand/squat pivot to return to bed. Pt left in supine with all needs in reach, bed alarm on.    Therapy Documentation Precautions:  Precautions Precautions: Fall Precaution Comments: LLE paresis more severe than in the LUE Restrictions Weight Bearing Restrictions: No   Therapy/Group: Individual Therapy  Natilee Gauer L 01/26/2019, 7:11 AM

## 2019-01-26 NOTE — Progress Notes (Signed)
Nutrition Follow-up  RD working remotely.  DOCUMENTATION CODES:   Underweight  INTERVENTION:   - Continue snacks BID between meals  - Pt's wife to bring in food from home to aid PO intake at meals  NUTRITION DIAGNOSIS:   Increased nutrient needs related to chronic illness (CHF) as evidenced by estimated needs.  Ongoing, being addressed via snacks  GOAL:   Patient will meet greater than or equal to 90% of their needs  Progressing  MONITOR:   PO intake, Labs, Weight trends, I & O's  REASON FOR ASSESSMENT:   Other (underweight BMI)    ASSESSMENT:   58 year old male with PMH of nonischemic cardiomyopathy with right heart catheterization 2015, shingles with postherpetic neuralgia hypertension, CHF with ejection fraction of 20%, EtOH abuse, CKD stage IV and glomerulonephritis. Presented 01/12/19 when his left leg became weak and fell without LOC. MRI of the brain showed patchy area of acute infarction in the deep white matter on the right including the corpus callosum possible watershed type infarct. Pt did not receive TPA. MRI lumbar spine showed grade 1 anterior listhesis of L4 on L5 with mild central cord protrusion. 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 Pt admitted to CIR on 9/03.  Noted target discharge date of 9/19. Pt with continued partial seizures.  Weight stable compared to admit weight.  Per PA note, plan to transfuse 1 unit PRBC today for hemoglobin 6.5.  Spoke with pt briefly via phone call to room. Pt reports that he has a good appetite and is eating well. Pt states that he ate well at breakfast this morning. Pt endorses receiving snacks as ordered. RD encouraged continued adequate PO intake.  Meal Completion: 25-100% x last 8 recorded meals (averaging 79%)  Medications reviewed and include: Prednisone  Labs reviewed: BUN 67, creatinine 2.85,  hemoglobin 6.5  UOP: 1200 ml x 24 hours  Diet Order:   Diet Order            Diet Heart Room service appropriate? Yes; Fluid consistency: Thin  Diet effective now              EDUCATION NEEDS:   Education needs have been addressed  Skin:  Skin Assessment: Reviewed RN Assessment  Last BM:  01/24/19  Height:   Ht Readings from Last 1 Encounters:  01/19/19 6\' 1"  (1.854 m)    Weight:   Wt Readings from Last 1 Encounters:  01/26/19 64.1 kg    Ideal Body Weight:  83.6 kg  BMI:  Body mass index is 18.64 kg/m.  Estimated Nutritional Needs:   Kcal:  2000-2200  Protein:  90-105 grams  Fluid:  >/= 1.8 L    Gaynell Face, MS, RD, LDN Inpatient Clinical Dietitian Pager: [phone] Weekend/After Hours: [phone]

## 2019-01-27 ENCOUNTER — Inpatient Hospital Stay (HOSPITAL_COMMUNITY): Payer: BC Managed Care – PPO | Admitting: Occupational Therapy

## 2019-01-27 ENCOUNTER — Inpatient Hospital Stay (HOSPITAL_COMMUNITY): Payer: BC Managed Care – PPO

## 2019-01-27 ENCOUNTER — Inpatient Hospital Stay (HOSPITAL_COMMUNITY): Payer: BC Managed Care – PPO | Admitting: *Deleted

## 2019-01-27 LAB — URINALYSIS, ROUTINE W REFLEX MICROSCOPIC
Bilirubin Urine: NEGATIVE
Glucose, UA: NEGATIVE mg/dL
Ketones, ur: NEGATIVE mg/dL
Leukocytes,Ua: NEGATIVE
Nitrite: NEGATIVE
Protein, ur: 100 mg/dL — AB
Specific Gravity, Urine: 1.011 (ref 1.005–1.030)
pH: 5 (ref 5.0–8.0)

## 2019-01-27 LAB — CBC
HCT: 18.4 % — ABNORMAL LOW (ref 39.0–52.0)
HCT: 22.3 % — ABNORMAL LOW (ref 39.0–52.0)
Hemoglobin: 6.6 g/dL — CL (ref 13.0–17.0)
Hemoglobin: 7.9 g/dL — ABNORMAL LOW (ref 13.0–17.0)
MCH: 34.7 pg — ABNORMAL HIGH (ref 26.0–34.0)
MCH: 33.6 pg (ref 26.0–34.0)
MCHC: 35.9 g/dL (ref 30.0–36.0)
MCHC: 35.4 g/dL (ref 30.0–36.0)
MCV: 96.8 fL (ref 80.0–100.0)
MCV: 94.9 fL (ref 80.0–100.0)
Platelets: 117 10*3/uL — ABNORMAL LOW (ref 150–400)
Platelets: 106 10*3/uL — ABNORMAL LOW (ref 150–400)
RBC: 1.9 MIL/uL — ABNORMAL LOW (ref 4.22–5.81)
RBC: 2.35 MIL/uL — ABNORMAL LOW (ref 4.22–5.81)
RDW: 15.8 % — ABNORMAL HIGH (ref 11.5–15.5)
RDW: 15 % (ref 11.5–15.5)
WBC: 4.2 10*3/uL (ref 4.0–10.5)
WBC: 4.8 10*3/uL (ref 4.0–10.5)
nRBC: 0 % (ref 0.0–0.2)
nRBC: 0 % (ref 0.0–0.2)

## 2019-01-27 LAB — PROTEIN / CREATININE RATIO, URINE
Creatinine, Urine: 45.28 mg/dL
Protein Creatinine Ratio: 1.59 mg/mg{Cre} — ABNORMAL HIGH (ref 0.00–0.15)
Total Protein, Urine: 72 mg/dL

## 2019-01-27 LAB — PREPARE RBC (CROSSMATCH)

## 2019-01-27 MED ORDER — SODIUM CHLORIDE 0.9% IV SOLUTION
Freq: Once | INTRAVENOUS | Status: AC
  Administered 2019-01-27: 07:00:00 via INTRAVENOUS

## 2019-01-27 MED ORDER — DARBEPOETIN ALFA 100 MCG/0.5ML IJ SOSY
100.0000 ug | PREFILLED_SYRINGE | INTRAMUSCULAR | Status: DC
  Administered 2019-01-27: 22:00:00 100 ug via SUBCUTANEOUS
  Filled 2019-01-27: qty 0.5

## 2019-01-27 NOTE — Progress Notes (Signed)
CRITICAL VALUE STICKER  CRITICAL VALUE: Hemoglobin 6.6  DATE & TIME NOTIFIED: 01/27/19 @4 :Gentry Roch    MD NOTIFIED: Danella Sensing 01/27/19   TIME OF NOTIFICATION:4:27am  RESPONSE: Await instruction from PA once in this morning.

## 2019-01-27 NOTE — Progress Notes (Signed)
Spoke with Marlowe Shores PA, only one unit of blood was infused instead of 2 units.HGB was 6.6 this morning so he would like the 2nd unit of blood to be transfused this morning. Patient is alert and oriented x4 and asymptomatic at this time will continue to monitor. Order has been place for the unit of blood to be transfused this morning.

## 2019-01-27 NOTE — Progress Notes (Signed)
Physical Therapy Session Note  Patient Details  Name: Jeremy Grant MRN: [id number] Date of Birth: 1960-09-04  Today's Date: 01/27/2019 PT Individual Time: 4098-1191 and 1303-1400 PT Individual Time Calculation (min): 43 min and 57 min  and Today's Date: 01/27/2019 PT Missed Time: 17 Minutes Missed Time Reason: Patient ill (Comment);Other (Comment);Patient fatigue(low hemoglobin level- 6.6 g/dL)  Short Term Goals: Week 2:  PT Short Term Goal 1 (Week 2): = to LTGs based on ELOS  Skilled Therapeutic Interventions/Progress Updates:    Session 1: Pt supine in bed upon PT arrival, agreeable to therapy tx and denies pain. Pt participated in bed level therapies this morning secondary to MD orders/low hemoglobin level. Pt supine in bed performed the following exercises for R LE strengthening and L LE neuro re-ed, cues for techniques and isolated muscle contraction, 2 x 10 of each: R SLR, L active assisted SLR, R heel slides, L active assisted heel slides, L active assisted hip ab/adduction, symmetric LE bridges, bridges with R LE extended more for increased weightbearing/use of L LE, L LE hip abduction against manual resistance in hooklying, L LE PNF D2 flexion/extension. Pt and therapist also discussed d/c planning including home set up, equipment needed for home, need for supervision from wife, and follow up therapy recommendations. Pt left supine in bed at end of session with needs in reach and bed alarm set, pt missed 17 minutes of skilled therapy tx this session secondary to low hemoglobin and fatigue.   Session 2: Pt supine in bed upon PT arrival, agreeable to therapy tx and denies pain, does report feeling weak and tired. Pt with low HBG level and currently receiving blood transfusion. Pt's wife present this session, discussed d/c planning, needed hands on family education, equipment recommendations and follow up therapy. Pt and his wife think that they would prefer and be able to do outpatient  neuro follow up therapy once leaving CIR, will follow up with SW to determine availability at outpatient clinic. Pt transferred to sitting EOB, worked on sitting tolerance/endurance. Discussed sitting OOB more this week once HBG levels return to safe level. Pt performed x 3 sit<>stands this session with RW and min assist, cues for techniques and increased weightshift over L LE. Pt performed 2 x 10 mini squats this session without UE support working on LE strength and knee control, manual facilitation for increased L lateral weightshift over L LE and cues for controlled movements. Therapist performed L LE calf and hamstring stretching this session with education on importance of this to prevent ankle contracture. Pt reports he does not like wearing PRAFO at night, this therapist recommended wearing either the [facility] or his AFO for at least 8 hours during the day, pt and wife agreeable. Pt seated edge of bed worked on L quad contraction to perform active assisted LAQ, worked on isolating quad activation over the hip flexors. Therapist checked vitals this session while seated EOB- WFL. Pt transferred to supine and left with needs in reach and bed alarm set, wife present and PRAFO donned to L LE.   Therapy Documentation Precautions:  Precautions Precautions: Fall Precaution Comments: LLE paresis more severe than in the LUE Restrictions Weight Bearing Restrictions: No    Therapy/Group: Individual Therapy  Netta Corrigan, PT, DPT 01/27/2019, 7:53 AM

## 2019-01-27 NOTE — Consult Note (Signed)
[facility]  HISTORY AND PHYSICAL  Jeremy Grant is an 58 y.o. male.    Chief Complaint: L leg weakness  HPI: Pt is a 8M who is well-known to me from clinic.  He had severe AKI d/t MPO-ANCA necrotizing GN.  Anti-histone ab was also positive as pt had been on hydralazine.  He was treated with pulse dose steroids followed by taper and cytoxan (starting 3/13) and actually required dialysis.  He received dialysis at [facility] for several months and was actually able to come off dialysis about 6 weeks ago.  He has been followed in our office and Cr is around 2.95.  His course had been complicated by shingles in May which left him with some neuropathy and a foot drop.    At last OP visit 01/08/2019, it was decided to complete 6 months of cytoxan and then transition to maintenance therapy--since he was off dialysis without clinical evidence of active disease.   Unfortunately his L leg weakness acutely worsened and he went to the ED and was found to have a new stroke (R ACA infarct).  He has had seizures and is now on depakote and keppra.  Now in rehab.  His Hgb has been low--> has required multiple transfusions.  FOBT +.  Has not had Aranesp since late August.  In this setting we are asked to see.  Pt is getting a blood transfusion today.   He was seen by my colleague 9/1 when he had a mild leukopenia to 3.6 with ALC 0.4.  Maintenance cytoxan was reduced from 100 mg daily --> 75 mg daily with good results.  Renal function has remained at baseline around 2.8-2.9.  He denies f/c, n/v, SOB, CP, LE edema, frank blood in urine, rash, nosebleed, oral ulcers.     PMH: Past Medical History:  Diagnosis Date  . Arthritis   . CHF (congestive heart failure) ([place])   . Combined systolic and diastolic cardiac dysfunction    Echo 11/03/2013 EF 47%, grade 3 diastolic dysfunction  . Hypertension   . NICM (nonischemic cardiomyopathy) ([place])    a. L/RHC (11/05/13): RA: 3, RV 52/5, PA 49/19 (31), PCWP 10, AO  166/93, PA 67%, Fick CO/CI: 5.71/2.97, Lmain: normal, LAD: large, without signficant dz, first diagonal has 20% dz at ostium, LCx: normal, RCA: 30% stenosis at the bifurcation of PDA and PLOM   PSH: Past Surgical History:  Procedure Laterality Date  . IR FLUORO GUIDE CV LINE RIGHT  07/25/2018  . IR US GUIDE VASC ACCESS RIGHT  07/25/2018  . LEFT AND RIGHT HEART CATHETERIZATION WITH CORONARY ANGIOGRAM N/A 11/05/2013   Procedure: LEFT AND RIGHT HEART CATHETERIZATION WITH CORONARY ANGIOGRAM;  Surgeon: Peter M Martinique, MD;  Location: [facility] C[facility] LAB;  Service: Cardiovascular;  Laterality: N/A;  . RIGHT HEART CATHETERIZATION N/A 01/14/2014   Procedure: RIGHT HEART CATH;  Surgeon: Larey Dresser, MD;  Location: [facility] CATH LAB;  Service: Cardiovascular;  Laterality: N/A;     Past Medical History:  Diagnosis Date  . Arthritis   . CHF (congestive heart failure) ([place])   . Combined systolic and diastolic cardiac dysfunction    Echo 11/03/2013 EF 82%, grade 3 diastolic dysfunction  . Hypertension   . NICM (nonischemic cardiomyopathy) ([place])    a. L/RHC (11/05/13): RA: 3, RV 52/5, PA 49/19 (31), PCWP 10, AO 166/93, PA 67%, Fick CO/CI: 5.71/2.97, Lmain: normal, LAD: large, without signficant dz, first diagonal has 20% dz at ostium, LCx: normal, RCA: 30% stenosis at  the bifurcation of PDA and PLOM    Medications:   Scheduled: . amLODipine  5 mg Oral Daily  . aspirin  325 mg Oral Daily  . atorvastatin  80 mg Oral q1800  . clopidogrel  75 mg Oral Daily  . cyclophosphamide  75 mg Oral Q lunch  . divalproex  1,000 mg Oral Q12H  . levETIRAcetam  500 mg Oral BID  . predniSONE  5 mg Oral Q breakfast  . tiZANidine  2 mg Oral QHS    Medications Prior to Admission  Medication Sig Dispense Refill  . aspirin 325 MG tablet Take 1 tablet (325 mg total) by mouth daily. 30 tablet 0  . atorvastatin (LIPITOR) 80 MG tablet Take 1 tablet (80 mg total) by mouth daily at 6 PM. 30 tablet 0  . clopidogrel (PLAVIX) 75 MG tablet  Take 1 tablet (75 mg total) by mouth daily for 18 days. 18 tablet 0  . [EXPIRED] cyclophosphamide (CYTOXAN) 25 MG capsule Take 3 capsules (75 mg total) by mouth daily with lunch for 10 days. Take with food to minimize GI upset. Take early in the day and maintain hydration. 30 capsule 0  . predniSONE (DELTASONE) 5 MG tablet Take 1 tablet (5 mg total) by mouth daily with breakfast. 30 tablet 0    ALLERGIES:   Allergies  Allergen Reactions  . Hydralazine Hcl     FAM HX: Family History  Problem Relation Age of Onset  . Heart attack Father   . Heart disease Father   . Arrhythmia Father   . Hypertension Brother   . Hypertension Brother     Social History:   reports that he has never smoked. He has never used smokeless tobacco. He reports current alcohol use of about 3.0 standard drinks of alcohol per week. He reports that he does not use drugs.  ROS: ROS: all other symptoms reviewed and are negative except as per HPI  Blood pressure (!) 147/86, pulse 83, temperature 98.5 F (36.9 C), temperature source Oral, resp. rate 17, height 6\' 1"  (1.854 m), weight 64.2 kg, SpO2 100 %. PHYSICAL EXAM: Physical Exam  GEN NAD, sitting in bed flat HEENT EOMI PERRL NECK no JVD PULM clear bilaterally no c/w/r CV RRR soft systolic murmur no r/g ABD soft, nontender, NABS EXT no LE edema NEURO AAO x 3 SKIN multiple ecchymoses but no rash   Results for orders placed or performed during the hospital encounter of 01/15/19 (from the past 48 hour(s))  Basic metabolic panel     Status: Abnormal   Collection Time: 01/26/19  7:45 AM  Result Value Ref Range   Sodium 141 135 - 145 mmol/L   Potassium 3.9 3.5 - 5.1 mmol/L   Chloride 110 98 - 111 mmol/L   CO2 21 (L) 22 - 32 mmol/L   Glucose, Bld 190 (H) 70 - 99 mg/dL   BUN 67 (H) 6 - 20 mg/dL   Creatinine, Ser 2.85 (H) 0.61 - 1.24 mg/dL   Calcium 8.8 (L) 8.9 - 10.3 mg/dL   GFR calc non Af Amer 23 (L) >60 mL/min   GFR calc Af Amer 27 (L) >60 mL/min    Anion gap 10 5 - 15    Comment: Performed at [facility], [address] [place], [place] [address]  CBC with Differential/Platelet     Status: Abnormal   Collection Time: 01/26/19  7:45 AM  Result Value Ref Range   WBC 4.2 4.0 - 10.5 K/uL  RBC 1.83 (L) 4.22 - 5.81 MIL/uL   Hemoglobin 6.5 (LL) 13.0 - 17.0 g/dL    Comment: REPEATED TO VERIFY THIS CRITICAL RESULT HAS VERIFIED AND BEEN CALLED TO M ORHUN RN BY ALLISON BENNETT ON 09 14 2020 AT 0944, AND HAS BEEN READ BACK.     HCT 18.6 (L) 39.0 - 52.0 %   MCV 101.6 (H) 80.0 - 100.0 fL   MCH 35.5 (H) 26.0 - 34.0 pg   MCHC 34.9 30.0 - 36.0 g/dL   RDW 13.3 11.5 - 15.5 %   Platelets 129 (L) 150 - 400 K/uL   nRBC 0.0 0.0 - 0.2 %   Neutrophils Relative % 79 %   Neutro Abs 3.4 1.7 - 7.7 K/uL   Lymphocytes Relative 9 %   Lymphs Abs 0.4 (L) 0.7 - 4.0 K/uL   Monocytes Relative 8 %   Monocytes Absolute 0.3 0.1 - 1.0 K/uL   Eosinophils Relative 2 %   Eosinophils Absolute 0.1 0.0 - 0.5 K/uL   Basophils Relative 1 %   Basophils Absolute 0.0 0.0 - 0.1 K/uL   Immature Granulocytes 1 %   Abs Immature Granulocytes 0.05 0.00 - 0.07 K/uL    Comment: Performed at [facility] [address], [place], [place] [address]  Type and screen [place]     Status: None (Preliminary result)   Collection Time: 01/26/19 10:17 AM  Result Value Ref Range   ABO/RH(D) O NEG    Antibody Screen NEG    Sample Expiration 01/29/2019,2359    Unit Number [id number]    Blood Component Type RED CELLS,LR    Unit division 00    Status of Unit ISSUED,FINAL    Transfusion Status OK TO TRANSFUSE    Crossmatch Result      Compatible Performed at [facility], [place] [address] [place], [place] [address]    Unit Number [id number]    Blood Component Type RBC, LR IRR    Unit division 00    Status of Unit ISSUED    Transfusion Status OK TO TRANSFUSE    Crossmatch Result Compatible   CBC     Status: Abnormal   Collection Time:  01/26/19 10:17 AM  Result Value Ref Range   WBC 4.9 4.0 - 10.5 K/uL   RBC 1.65 (L) 4.22 - 5.81 MIL/uL   Hemoglobin 5.9 (LL) 13.0 - 17.0 g/dL    Comment: REPEATED TO VERIFY CRITICAL VALUE NOTED.  VALUE IS CONSISTENT WITH PREVIOUSLY REPORTED AND CALLED VALUE.    HCT 16.9 (L) 39.0 - 52.0 %   MCV 102.4 (H) 80.0 - 100.0 fL   MCH 35.8 (H) 26.0 - 34.0 pg   MCHC 34.9 30.0 - 36.0 g/dL   RDW 13.2 11.5 - 15.5 %   Platelets 129 (L) 150 - 400 K/uL    Comment: Immature Platelet Fraction may be clinically indicated, consider ordering this additional test [id number]    nRBC 0.0 0.0 - 0.2 %    Comment: Performed at [facility] [address], [place], [place] [address]  Prepare RBC     Status: None   Collection Time: 01/26/19 10:17 AM  Result Value Ref Range   Order Confirmation      ORDER PROCESSED BY BLOOD BANK Performed at [facility], [place] [address] [place], [place] [address]   CBC     Status: Abnormal   Collection Time: 01/27/19  3:49 AM  Result Value Ref  Range   WBC 4.2 4.0 - 10.5 K/uL   RBC 1.90 (L) 4.22 - 5.81 MIL/uL   Hemoglobin 6.6 (LL) 13.0 - 17.0 g/dL    Comment: REPEATED TO VERIFY THIS CRITICAL RESULT HAS VERIFIED AND BEEN CALLED TO H.MOORE,RN BY MELISSA BROGDON ON 09 15 2020 AT 1275, AND HAS BEEN READ BACK.     HCT 18.4 (L) 39.0 - 52.0 %   MCV 96.8 80.0 - 100.0 fL   MCH 34.7 (H) 26.0 - 34.0 pg   MCHC 35.9 30.0 - 36.0 g/dL   RDW 15.8 (H) 11.5 - 15.5 %   Platelets 117 (L) 150 - 400 K/uL    Comment: REPEATED TO VERIFY PLATELET COUNT CONFIRMED BY SMEAR SPECIMEN CHECKED FOR CLOTS Immature Platelet Fraction may be clinically indicated, consider ordering this additional test [id number]    nRBC 0.0 0.0 - 0.2 %    Comment: Performed at [facility], [place] [address], [place], [place] [address]  Prepare RBC     Status: None   Collection Time: 01/27/19  7:00 AM  Result Value Ref Range   Order Confirmation      ORDER PROCESSED BY BLOOD BANK Performed at  [facility], [place] [address], [place], [place] [address]     No results found.  Assessment/Plan  1.  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  He was initially treated with pulse-dose steroids followed by taper and cyclophosphamide which he is still on.  It is coming up to decide his maintenance therapy course (to continue for approximately 18-24 months).  Given history of severe AKI which required dialysis, observation without maintenance therapy is not appropriate.  He has a lesser risk of relapse as opposed to PR-3 ANCA vasculitis which is fortunate.  His options for maintenance are rituximab, Imuran (azathioprine), or Cellcept.  Rituximab has better long-term remission rates as opposed to AZA or Cellcept; cost would an issue so will have to investigate this.  Additionally there is a practical concern- if pt had a seizure while receiving infusion, would be impossible to tell if an infusion reaction were taking place and could preclude him from receiving therapy with rituximab in the future if he had a relapse.  So- I think I will choose to hold on ritux for now.  AZA is superior to cellcept.  I would favor this and will discuss with pharmacy any potential myelosuppressive concerns.  2.  Anemia: Getting 2 u pRBCs, Aranesp dosed for this evening.  3.  Seizures: keppra and valproate  4.  S/p ACA stroke: on ASA and Plavix  Tyrus Wilms 01/27/2019, 3:38 PM

## 2019-01-27 NOTE — Progress Notes (Signed)
Received call from [place] at 2:52, Mr. Wengert HGB 6.5 he received a transfusion ( 1 unit PRBC) on 01/26/2019. HGB 6.5 this am and yesterday HGB 5.9. Dr. Dagoberto Ligas note was reviewed, Vitals stable and Mr. Taras is asymptomatic at this time.  Will continue to monitor at this time.

## 2019-01-27 NOTE — Progress Notes (Signed)
Social Work Patient ID: Jeremy Grant, male   DOB: Feb 12, 1961, 58 y.o.   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  A walker  will not resolve issue with performing activities of daily living.  A wheelchair will allow patient to safely perform daily activities.  Patient is not able to propel themselves in the home using a standard weight wheelchair due to endurance and fatigue .  Patient can self propel in the lightweight wheelchair.

## 2019-01-27 NOTE — Evaluation (Signed)
Recreational Therapy Assessment and Plan  Patient Details  Name: Jeremy Grant MRN: [id number] Date of Birth: 09/17/1960 Today's Date: 01/27/2019  Rehab Potential: Good  ELOS:   9/19?  Assessment Problem List:      Patient Active Problem List   Diagnosis Date Noted  . 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 Weakness   . CVA (cerebral vascular accident) (HCC) 01/12/2019  . Malnutrition of moderate degree 07/31/2018  . Elevated serum protein level   . Multiple myeloma (HCC)   . Pancytopenia (HCC)   . Normocytic normochromic anemia 07/16/2018  . Acute kidney failure (HCC) 07/16/2018  . NICM (nonischemic cardiomyopathy) (HCC) 11/12/2013  . History of ETOH abuse 11/12/2013  . Chronic combined systolic and diastolic CHF (congestive heart failure) (HCC) 11/12/2013  . Congestive dilated cardiomyopathy (HCC) 11/04/2013  . Malignant hypertension     Past Medical History:      Past Medical History:  Diagnosis Date  . Arthritis   . CHF (congestive heart failure) (HCC)   . Combined systolic and diastolic cardiac dysfunction    Echo 11/03/2013 EF 20%, grade 3 diastolic dysfunction  . Hypertension   . NICM (nonischemic cardiomyopathy) (HCC)    a. L/RHC (11/05/13): RA: 3, RV 52/5, PA 49/19 (31), PCWP 10, AO 166/93, PA 67%, Fick CO/CI: 5.71/2.97, Lmain: normal, LAD: large, without signficant dz, first diagonal has 20% dz at ostium, LCx: normal, RCA: 30% stenosis at the bifurcation of PDA and PLOM   Past Surgical History:       Past Surgical History:  Procedure Laterality Date  . IR FLUORO GUIDE CV LINE RIGHT  07/25/2018  . IR US GUIDE VASC ACCESS RIGHT  07/25/2018  . LEFT AND RIGHT HEART CATHETERIZATION WITH CORONARY ANGIOGRAM N/A 11/05/2013   Procedure: LEFT AND RIGHT HEART CATHETERIZATION WITH CORONARY ANGIOGRAM;  Surgeon: Peter M Jordan, MD;  Location: [facility] CATH LAB;  Service: Cardiovascular;  Laterality: N/A;  . 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 Per chart review lives with spouse. Independent prior to admission he had been working at [employer] up until March 2020. 1 level home with 3 steps to entry. Presented 01/12/2019 when his left leg became weak and fell without loss of consciousness. He initially did not come to the hospital. Hemoglobin 8.8, creatinine 2.95, urine drug screen negative. MRI of the brain showed patchy area of acute infarction in the deep white matter on the right including the corpus callosum possible watershed type infarct. Patient did not receive TPA. MRI lumbar spine showed grade 1 anterior listhesis of L4 on L5 with mild central cord protrusion. 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 Echocardiogram with ejection fraction of 55% normal systolic function. Carotid Dopplers to 1-39% ICA stenosis. Neurology follow-up maintained on aspirin and Plavix for CVA prophylaxis x3 weeks then aspirin alone. No   plan for TEE or loop recorder at this time. Subcutaneous Lovenox for DVT prophylaxis. Tolerating a regular diet. Patient transferred to CIR on 01/15/2019 .   Pt presents with decreased activity tolerance, decreased  functional mobility, decreased balance, decreased coordination Limiting pt's independence with leisure/community pursuits.  Plan  Min 1 TR session >20 minutes during LOS  Recommendations for other services: Neuropsych  Discharge Criteria: Patient will be discharged from TR if patient refuses treatment 3 consecutive times without medical reason.  If treatment goals not met, if there is a change in medical status, if patient makes no progress towards goals or if patient is discharged from hospital.  The above assessment, treatment plan, treatment alternatives and goals were discussed and mutually agreed upon: by patient   * session today also included activity analysis with pt and wife as well as identification of potential adaptation for leisure pursuits.  Pt somewhat emotional but appreciate of TR services/educaiton.  [place] 01/27/2019, 4:04 PM

## 2019-01-27 NOTE — Progress Notes (Signed)
Occupational Therapy Session Note  Patient Details  Name: Jeremy Grant MRN: [id number] Date of Birth: Mar 05, 1961  Today's Date: 01/27/2019 OT Individual Time: 0301-3143 OT Individual Time Calculation (min): 64 min    Short Term Goals: Week 2:  OT Short Term Goal 1 (Week 2): Continue working on established supervision level LTGs.  Skilled Therapeutic Interventions/Progress Updates:    Pt with limited session secondary to decreased HGB and pt not feeling well.  Per MD bedside therapies only so pt worked on sponge bath sit to stand during session. BP taken in supine at 155/86.  He needed min assist for supine to sit EOB.  He then maintained sitting balance with supervision while completing UB bathing at the same supervision level.  Min assist for sit to stand to remove clothing as well as for washing his buttocks and pulling garments over hips.  He donned his pullover shirt with setup only.  BP taken in sitting at 183/83.  Pt brushed his teeth and combed his hair with setup as well before transitioning back to supine to rest.  Pt's spouse present as well with call button, phone, and safety belt in place.   Therapy Documentation Precautions:  Precautions Precautions: Fall Precaution Comments: LLE paresis more severe than in the LUE Restrictions Weight Bearing Restrictions: No  Vital Signs: Therapy Vitals Temp: 99.1 F (37.3 C) Temp Source: Oral Pulse Rate: 93 Resp: 20 BP: (!) 183/83 Oxygen Therapy SpO2: 100 % Pain: Pain Assessment Pain Scale: Faces Pain Score: 0-No pain ADL: See Care Tool Section for some details of ADL  Therapy/Group: Individual Therapy  Brody Bonneau OTR/L 01/27/2019, 12:32 PM

## 2019-01-27 NOTE — Progress Notes (Signed)
[facility] PHYSICAL MEDICINE & REHABILITATION PROGRESS NOTE   Subjective/Complaints:  No further seizures, reviewed Hgb, pt feels weak , awaiting second unit transfusion    ROS- denies CP, SOB, N/V/D Objective:   No results found. Recent Labs    01/26/19 1017 01/27/19 0349  WBC 4.9 4.2  HGB 5.9* 6.6*  HCT 16.9* 18.4*  PLT 129* 117*   Recent Labs    01/26/19 0745  NA 141  K 3.9  CL 110  CO2 21*  GLUCOSE 190*  BUN 67*  CREATININE 2.85*  CALCIUM 8.8*    Intake/Output Summary (Last 24 hours) at 01/27/2019 0806 Last data filed at 01/27/2019 0450 Gross per 24 hour  Intake 1670 ml  Output 1450 ml  Net 220 ml     Physical Exam: Vital Signs Blood pressure (!) 152/86, pulse 80, temperature 98.2 F (36.8 C), temperature source Oral, resp. rate 18, height 6\' 1"  (1.854 m), weight 64.2 kg, SpO2 99 %.  General: No acute distress Sitting up in manual w/c in room, watching TV, appropriate, NAD;  Mood and affect are appropriate Pale conjunctiva B/L Heart:RRR no rubs or murmurs Lungs: Clear to auscultation B/L Abdomen: hyperactive bowel sounds, soft, nontender to palpation, nondistended Extremities: No clubbing, cyanosis, or edema Skin: No evidence of breakdown, no evidence of rash Neurologic: Cranial nerves II through XII intact, motor strength is 5/5 inright 4- Left  deltoid, bicep, tricep, grip,2- Left  hip flexor, knee extensors,0/5 Left  ankle dorsiflexor and plantar flexor, 5/5 RLE Sensory exam normal sensation to light touch and proprioception in bilateral upper and lower extremities Cerebellar exam normal finger to nose to finger as well as heel to shin in bilateral upper and lower extremities Tone MAS 2 at Left hamstrings Musculoskeletal: Full range of motion in all 4 extremities. No joint swelling    Assessment/Plan: 1. Functional deficits secondary to RIght ACA infarct  which require 3+ hours per day of interdisciplinary therapy in a comprehensive inpatient  rehab setting.  Physiatrist is providing close team supervision and 24 hour management of active medical problems listed below.  Physiatrist and rehab team continue to assess barriers to discharge/monitor patient progress toward functional and medical goals  Care Tool:  Bathing  Bathing activity did not occur: Refused Body parts bathed by patient: Right arm, Left arm, Chest, Abdomen, Right upper leg, Left upper leg, Right lower leg, Left lower leg, Face, Front perineal area, Buttocks   Body parts bathed by helper: Front perineal area, Buttocks     Bathing assist Assist Level: Minimal Assistance - Patient > 75%     Upper Body Dressing/Undressing Upper body dressing   What is the patient wearing?: Pull over shirt    Upper body assist Assist Level: Supervision/Verbal cueing    Lower Body Dressing/Undressing Lower body dressing      What is the patient wearing?: Underwear/pull up, Pants     Lower body assist Assist for lower body dressing: Moderate Assistance - Patient 50 - 74%     Toileting Toileting    Toileting assist Assist for toileting: Independent with assistive device Assistive Device Comment: urinal   Transfers Chair/bed transfer  Transfers assist     Chair/bed transfer assist level: Minimal Assistance - Patient > 75%     Locomotion Ambulation   Ambulation assist      Assist level: Minimal Assistance - Patient > 75% Assistive device: Walker-rolling Max distance: 80   Walk 10 feet activity   Assist     Assist level:  Minimal Assistance - Patient > 75% Assistive device: Walker-rolling   Walk 50 feet activity   Assist    Assist level: Contact Guard/Touching assist Assistive device: Walker-rolling    Walk 150 feet activity   Assist Walk 150 feet activity did not occur: Safety/medical concerns  Assist level: Minimal Assistance - Patient > 75% Assistive device: Walker-rolling    Walk 10 feet on uneven surface  activity   Assist  Walk 10 feet on uneven surfaces activity did not occur: Safety/medical concerns         Wheelchair     Assist Will patient use wheelchair at discharge?: Yes Type of Wheelchair: Manual    Wheelchair assist level: Minimal Assistance - Patient > 75% Max wheelchair distance: 150    Wheelchair 50 feet with 2 turns activity    Assist        Assist Level: Minimal Assistance - Patient > 75%   Wheelchair 150 feet activity     Assist      Assist Level: Minimal Assistance - Patient > 75%   Blood pressure (!) 152/86, pulse 80, temperature 98.2 F (36.8 C), temperature source Oral, resp. rate 18, height 6\' 1"  (1.854 m), weight 64.2 kg, SpO2 99 %.    Medical Problem List and Plan: 1.  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  Plan 30-day cardiac event monitor as outpatient tent d/c 9/19- may need to push back 2-3d to stabilize -discussed with OT, bedside tx until 2nd unit transfused 2.  Antithrombotics: -DVT/anticoagulation:  plt on low side with bruising will stop enoxaparin             -antiplatelet therapy: Aspirin 325 mg, Plavix 75 mg daily x3 weeks then aspirin alone, this should be around 9/21, given anemia will touch base with neuro and see if this need to be changed sooner 3. Pain Management: Tylenol as needed 4. Mood: Provide emotional support             -antipsychotic agents: N/A 5. Neuropsych: This patient is capable of making decisions on his own behalf. 6. Skin/Wound Care: Routine skin checks 7. Fluids/Electrolytes/Nutrition: Routine in and outs with follow-up chemistries 8.  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  Monitor for any signs of fluid overload 9.  Hypertension. Permissive, but starting to tighten control   Vitals:   01/26/19 1936 01/27/19 0448  BP: (!) 150/69 (!) 152/86  Pulse:  76 80  Resp: 16 18  Temp: 97.9 F (36.6 C) 98.2 F (36.8 C)  SpO2: 100% 99%  on  amlodipine 2.5mg  need to adjust dose increase to 5mg  -improving will hold off on another increase for now  10.  ANCA necrotizing concentric glomerulonephritis.  Continue Cytoxan as well as chronic prednisone.  Follow-up per renal services.  Admission creatinine of 2.95  -Cr baseline at 2.85- BUN up a little  at 67- is a little dry 11.  Pancytopenia/chronic anemia.  Follow-up labs patient had been receiving Aranesp as an outpatient with renal services.  - Hb 6.5- followup 5.9- will transfuse 1 unit pRBCs-  will need another Stool OB + off enoxaparin, ask GI whether endoscopy is recommended at this time, no gross blood in stool  or emesis 12.  Hyperlipidemia.  Lipitor 13.  History of alcohol use.  Counseling and monitor. 14. Concern for spasticity formation- will con't to monitor- since has elevated DTRs on LLE,  is at risk for spasticity development. 15. Diarrhea/Bowel issues/Nausea  9/6- will give Zofran prn 4 mg q8 hours prn and monitor 16. Dispo- home with wife and 24/7 sup 17.  Flexor spasms LLE add tizanidine qhs- reassured pt that sei  18.  Seizure left-sided post stroke.   Todd's paralysis left upper extremityresolved .  CT of the head showed expected evolution . Per Neuro rec changed seizure prophyllaxis to Valproate 750mg  BID with Keppra 500mg  BID.  Had a 1000mg  IV valproate load    Negative  EEG.-As discussed with neurology will get a stat level for valproate and if on the low side may need to increase valproic acid dose.  The patient is also not having generalized seizures but short duration partial seizures which should be relatively well-tolerated.  If the valproic acid level is on the high side of the normal range, may need to add Vimpat, discussed with Dr. Leonie Man Has had only partial seizures without resultant Todd's paralysis- VPA level low increase to 1000mg  BID, give IV VPA bolus of 500mg  , check VPA  level 9/15  lOS: 12 days A FACE TO FACE EVALUATION WAS PERFORMED  Charlett Blake 01/27/2019, 8:06 AM

## 2019-01-28 ENCOUNTER — Inpatient Hospital Stay (HOSPITAL_COMMUNITY): Payer: BC Managed Care – PPO

## 2019-01-28 ENCOUNTER — Inpatient Hospital Stay (HOSPITAL_COMMUNITY): Payer: BC Managed Care – PPO | Admitting: Occupational Therapy

## 2019-01-28 LAB — GLUCOSE, CAPILLARY: Glucose-Capillary: 153 mg/dL — ABNORMAL HIGH (ref 70–99)

## 2019-01-28 LAB — MPO/PR-3 (ANCA) ANTIBODIES
ANCA Proteinase 3: <3.5 U/mL (ref 0.0–3.5)
Myeloperoxidase Abs: 18.5 U/mL — ABNORMAL HIGH (ref 0.0–9.0)

## 2019-01-28 LAB — IRON AND TIBC
Iron: 63 ug/dL (ref 45–182)
Saturation Ratios: 31 % (ref 17.9–39.5)
TIBC: 202 ug/dL — ABNORMAL LOW (ref 250–450)
UIBC: 139 ug/dL

## 2019-01-28 LAB — VALPROIC ACID LEVEL: Valproic Acid Lvl: 22 ug/mL — ABNORMAL LOW (ref 50.0–100.0)

## 2019-01-28 LAB — FERRITIN: Ferritin: 812 ng/mL — ABNORMAL HIGH (ref 24–336)

## 2019-01-28 MED ORDER — ASPIRIN EC 81 MG PO TBEC
81.0000 mg | DELAYED_RELEASE_TABLET | Freq: Every day | ORAL | Status: DC
  Administered 2019-01-28 – 2019-02-04 (×8): 81 mg via ORAL
  Filled 2019-01-28 (×8): qty 1

## 2019-01-28 NOTE — Progress Notes (Signed)
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  Assessment: 89 yom on cyclophosphamide for ANCA vasculitis. Nephrology wishes to change to azathioprine but asked for pharmacy to review for drug interaction between current medications and azathioprine, specifically depakote and keppra. We were also asked to review myelosuppressive and liver effects related to added azathioprine to his regimen.  There are no true drug interactions between azathioprine and any of his current medications, including keppra and depakote. Below I have listed each drug and the frequency of possible hematologic or hepatic adverse effects associated with the drugs in question. LFTs from 9/4 were normal.   Azathioprine - hepatically metabolized but no hepatic dose adjustment are recommended ADR - Frequency not defined: leukopenia, thrombocytopenia, hepatotoxicity <1% pancytopenia, bone marrow suppression  Keppra - no hepatic adjustments are recommended but it does requires renal adjustment ADR - 3% decreased WBC and decreased neutrophils <1% hepatic failure/hepatitis, decreased Hgb/Hct, RBC, neutropenia, pancytopenia, thrombocytopenia   Depakote - hepatically metabolized and it is not recommended in hepatic impairment, no renal adjustments are recommended ADR - 1-27% dose-related thrombocytopenia, 1-5% increased LFTs <1% agranulocytosis, bone marrow depression, hepatotoxicity, leukopenia *Last valproic acid level from 9/12 was low at 25  All  of these drugs have some sort of myelosuppression or hepatotoxicity associated with them but as mentioned above, there are no true drug interactions aside from a possible additive effect. However, pt is already on cyclophosphamide which also has a lot of these same adverse effects.    Plan:  No issues from a drug interaction standpoint to change cyclophosphamide to azathioprine Would require close monitoring of both renal and hepatic function  Thank you for the consult!  Krissy Orebaugh, Rande Lawman 01/28/2019,1:33 PM

## 2019-01-28 NOTE — Progress Notes (Signed)
Physical Therapy Session Note  Patient Details  Name: Jeremy Grant MRN: [id number] Date of Birth: 01-10-1961  Today's Date: 01/28/2019 PT Individual Time: 7001-7494 and 1531-1611 PT Individual Time Calculation (min): 30 min and 40 min   Short Term Goals: Week 2:  PT Short Term Goal 1 (Week 2): = to LTGs based on ELOS  Skilled Therapeutic Interventions/Progress Updates:    Session 1: Pt supine in bed upon PT arrival, agreeable to therapy tx and denies pain. Pt transferred to sitting with supervision and donned R shoe with supervision, assist to don L shoe with AFO. Pt ambulated x 5 ft to w/c with RW and min assist, transported to the gym. Pt ambulated x 40 ft to the therapy mat with RW and min assist, cues for increased R step length/longer L stance time. Pt worked on L LE weightbearing and neuro re-ed to performed 2 x 10 mini squats without UE support, 2 x 10 sit<>stands without UE support, 2 x 10 R LE sidesteps in place working on L LE stance control without UE support, used mirror during tasks for visual feedback of postural control/balance. Pt ambulated back to w/c with RW and min assist x 40 ft, transported back to room and left in w/c with needs in reach and wife present. Discussed new d/c date and discussed beginning hands-on family education in upcoming sessions.   Session 2: Pt supine in bed upon PT arrival, agreeable to therapy tx and denies pain but reports feeling very fatigued this afternoon. Pt reports "I felt better this morning but I still don't think my HBG level is where is needs to be." Per lab values from yesterday afternoon, HBG is 7.9. Pt transferred to sitting with supervision and donned R shoe with supervision, assist to don L shoe with AFO. Pt ambulated x 80 ft towards the gym with RW and min assist, cues for increased R step length, pt with increased work of breathing and had to take seated rest break secondary to fatigue. Pt transported the rest of the way in w/c. Stand  pivot to the mat with min assist, pt continues to appear very fatigued- vitals WFL. Pt worked on seated L LE neuro re-ed to perform knee flexion/extension with scooter, x 10. Pt continues to appear very fatigued, pt requesting to go back and lay  Down. "I just feel so tired." Pt performed stand pivot to w/c min assist and transported back to room, stand pivot to bed with min assist and sit>supine with supervision. Therapist donned L PRAFO for ankle ROM. Pt left with needs in reach and bed alarm set.    Therapy Documentation Precautions:  Precautions Precautions: Fall Precaution Comments: LLE paresis more severe than in the LUE Restrictions Weight Bearing Restrictions: No    Therapy/Group: Individual Therapy  Netta Corrigan, PT, DPT 01/28/2019, 7:59 AM

## 2019-01-28 NOTE — Patient Care Conference (Signed)
Inpatient RehabilitationTeam Conference and Plan of Care Update Date: 01/28/2019   Time: 11: 30 AM    Patient Name: Jeremy Grant      Medical Record Number: [id number]  Date of Birth: May 27, 1960 Sex: Male         Room/Bed: 4M09C/4M09C-01 Payor Info: Payor: BLUE [place] / Plan: [employer]BS COMM PPO / Product Type: *No Product type* /    Admit Date/Time:  01/15/2019  6:52 PM  Primary Diagnosis:  Acute unilateral cerebral infarction in a watershed distribution [facility])  Patient Active Problem List   Diagnosis Date Noted  . Acute unilateral cerebral infarction in a watershed distribution [facility]) 01/15/2019  . Weakness   . CVA (cerebral vascular accident) ([place]) 01/12/2019  . Malnutrition of moderate degree 07/31/2018  . Elevated serum protein level   . Multiple myeloma ([place])   . Pancytopenia ([place])   . Normocytic normochromic anemia 07/16/2018  . Acute kidney failure ([place]) 07/16/2018  . NICM (nonischemic cardiomyopathy) ([place]) 11/12/2013  . History of ETOH abuse 11/12/2013  . Chronic combined systolic and diastolic CHF (congestive heart failure) ([place]) 11/12/2013  . Congestive dilated cardiomyopathy ([place]) 11/04/2013  . Malignant hypertension     Expected Discharge Date: Expected Discharge Date: 02/04/19  Team Members Present: Physician leading conference: Dr. Alysia Penna Social Worker Present: Ovidio Kin, LCSW Nurse Present: Other (comment)(Michelle Orhun-LPN) PT Present: Michaelene Song, PT OT Present: Clyda Greener, OT SLP Present: Stormy Fabian, SLP PPS Coordinator present : Gunnar Fusi, SLP     Current Status/Progress Goal Weekly Team Focus  Bowel/Bladder   continent of b/b; LBM: 09/14  remain continent of b/b  assist with tolieting needs prn   Swallow/Nutrition/ Hydration             ADL's   Supervision or UB selfcare with min assist for LB selfcare sit to stand.  Transfers are at a min assist level as well.  Stil with slight weakness in the LUE proximally as  well as the left trunk.  supervision overall  selfcare retraining, transfer training, neuromuscular re-education, balance retraining, transfer training, DME education,pt/family education   Mobility   min assist bed mobility, min assist transfers and gait with RW x 130 ft  mod-I bed mobility, transfers, and gait up to 144f with LRAD; supervision car transfer  neuro re-ed, gait, transfers, standing balance, activity tolerance, d/c planning, education   Communication             Safety/Cognition/ Behavioral Observations            Pain   c/o generalized pain for soreness from PT; prn tylenol  pain level <2/10  assess pain level QS and prn   Skin   skin intact  maintain skin integrity  assess skin QS and prn      *See Care Plan and progress notes for long and short-term goals.     Barriers to Discharge  Current Status/Progress Possible Resolutions Date Resolved   Nursing                  PT                    OT                  SLP                SW                Discharge Planning/Teaching Needs:  Wife is planning on being with him due to partial seizures he had end of last week. Both wanting to be sure he is medically stable to DC on Sat. Anemia this week blood transfusions, limited participatin in therapies      Team Discussion:  Missed several days of therapy due to seizures and anemia-and need for blood transfusions. He is feeling better today and able to participate in therapies. Currently min assist level with goals of supervision/CGA. Off blood thinners now.  New AFO in room PT to work with. Medically needs few extra days for stability and be ready for home.  Revisions to Treatment Plan:  DC 9/23    Medical Summary Current Status: Pancytopenia, nephrology reevaluating patient, requiring transfusion x2 units, may discontinue Plavix Weekly Focus/Goal: Monitor for further blood loss, monitor for excessive blue bruising and platelet count, discontinue enoxaparin and  clopidogrel  Barriers to Discharge: Medical stability;Other (comments)  Barriers to Discharge Comments: Seizures, severe anemia requiring transfusion Possible Resolutions to Barriers: Medical management as above, will need nephrology reevaluation of immunosuppressants.   Continued Need for Acute Rehabilitation Level of Care: The patient requires daily medical management by a physician with specialized training in physical medicine and rehabilitation for the following reasons: Direction of a multidisciplinary physical rehabilitation program to maximize functional independence : Yes Medical management of patient stability for increased activity during participation in an intensive rehabilitation regime.: Yes Analysis of laboratory values and/or radiology reports with any subsequent need for medication adjustment and/or medical intervention. : Yes   I attest that I was present, lead the team conference, and concur with the assessment and plan of the team. Teleconference held due to COVID 19   Sid Greener, Gardiner Rhyme 01/28/2019, 3:05 PM

## 2019-01-28 NOTE — Progress Notes (Signed)
Occupational Therapy Session Note  Patient Details  Name: Jeremy Grant MRN: [id number] Date of Birth: Nov 04, 1960  Today's Date: 01/28/2019 OT Individual Time: 0160-1093 OT Individual Time Calculation (min): 58 min    Short Term Goals: Week 2:  OT Short Term Goal 1 (Week 2): Continue working on established supervision level LTGs.  Skilled Therapeutic Interventions/Progress Updates:    Patient in bed, ready for therapy, states that he is feeling a little better.  Supine to SSP with CGA, SPT to/from bed, mat table and w/c with mod A, cues for weight bearing on left LE.  Completed sponge bath and bathing w/c level.  Oral care/grooming with set up, UB dressing set up, LB dressing min A, footwear mod A.  Completed unsupported sitting posture, balance and conditioning activities with focus on symmetry and motor control - tolerated unsupported sitting for 15 minutes, continued with seated conditioning activities w/c level.  Patient returned to bed at close of session - call bell and tray table in reach  Therapy Documentation Precautions:  Precautions Precautions: Fall Precaution Comments: LLE paresis more severe than in the LUE Restrictions Weight Bearing Restrictions: No General:   Vital Signs:  Pain: Pain Assessment Pain Scale: 0-10 Pain Score: 0-No pain Other Treatments:     Therapy/Group: Individual Therapy  Carlos Levering 01/28/2019, 12:11 PM

## 2019-01-28 NOTE — Progress Notes (Signed)
[facility] PHYSICAL MEDICINE & REHABILITATION PROGRESS NOTE   Subjective/Complaints:  Appreciate nephrology note.  Antiplatelet agents reviewed with neurology.  Given low platelet count as well as pancytopenia will be able to stop Plavix earlier  No further seizures ROS- denies CP, SOB, N/V/D Objective:   No results found. Recent Labs    01/27/19 0349 01/27/19 1647  WBC 4.2 4.8  HGB 6.6* 7.9*  HCT 18.4* 22.3*  PLT 117* 106*   Recent Labs    01/26/19 0745  NA 141  K 3.9  CL 110  CO2 21*  GLUCOSE 190*  BUN 67*  CREATININE 2.85*  CALCIUM 8.8*    Intake/Output Summary (Last 24 hours) at 01/28/2019 0942 Last data filed at 01/28/2019 0754 Gross per 24 hour  Intake 1385 ml  Output 1600 ml  Net -215 ml     Physical Exam: Vital Signs Blood pressure (!) 155/95, pulse 77, temperature (!) 97.5 F (36.4 C), temperature source Oral, resp. rate 18, height 6\' 1"  (1.854 m), weight 64.1 kg, SpO2 99 %.  General: No acute distress Sitting up in manual w/c in room, watching TV, appropriate, NAD;  Mood and affect are appropriate Pale conjunctiva B/L Heart:RRR no rubs or murmurs Lungs: Clear to auscultation B/L Abdomen: hyperactive bowel sounds, soft, nontender to palpation, nondistended Extremities: No clubbing, cyanosis, or edema Skin: No evidence of breakdown, no evidence of rash Neurologic: Cranial nerves II through XII intact, motor strength is 5/5 inright 4- Left  deltoid, bicep, tricep, grip,3- Left  hip flexor, knee extensors,0/5 Left  ankle dorsiflexor and plantar flexor, 5/5 RLE Sensory exam normal sensation to light touch and proprioception in bilateral upper and lower extremities Cerebellar exam normal finger to nose to finger as well as heel to shin in bilateral upper and lower extremities Tone MAS 2 at Left hamstrings Musculoskeletal: Full range of motion in all 4 extremities. No joint swelling    Assessment/Plan: 1. Functional deficits secondary to RIght ACA  infarct  which require 3+ hours per day of interdisciplinary therapy in a comprehensive inpatient rehab setting.  Physiatrist is providing close team supervision and 24 hour management of active medical problems listed below.  Physiatrist and rehab team continue to assess barriers to discharge/monitor patient progress toward functional and medical goals  Care Tool:  Bathing  Bathing activity did not occur: Refused Body parts bathed by patient: Right arm, Left arm, Chest, Abdomen, Right upper leg, Left upper leg, Right lower leg, Left lower leg, Face, Front perineal area, Buttocks   Body parts bathed by helper: Front perineal area, Buttocks     Bathing assist Assist Level: Minimal Assistance - Patient > 75%     Upper Body Dressing/Undressing Upper body dressing   What is the patient wearing?: Pull over shirt    Upper body assist Assist Level: Supervision/Verbal cueing    Lower Body Dressing/Undressing Lower body dressing      What is the patient wearing?: Underwear/pull up, Pants     Lower body assist Assist for lower body dressing: Moderate Assistance - Patient 50 - 74%     Toileting Toileting    Toileting assist Assist for toileting: Independent with assistive device Assistive Device Comment: urinal   Transfers Chair/bed transfer  Transfers assist     Chair/bed transfer assist level: Minimal Assistance - Patient > 75%     Locomotion Ambulation   Ambulation assist      Assist level: Minimal Assistance - Patient > 75% Assistive device: Walker-rolling Max distance: 80  Walk 10 feet activity   Assist     Assist level: Minimal Assistance - Patient > 75% Assistive device: Walker-rolling   Walk 50 feet activity   Assist    Assist level: Contact Guard/Touching assist Assistive device: Walker-rolling    Walk 150 feet activity   Assist Walk 150 feet activity did not occur: Safety/medical concerns  Assist level: Minimal Assistance - Patient >  75% Assistive device: Walker-rolling    Walk 10 feet on uneven surface  activity   Assist Walk 10 feet on uneven surfaces activity did not occur: Safety/medical concerns         Wheelchair     Assist Will patient use wheelchair at discharge?: Yes Type of Wheelchair: Manual    Wheelchair assist level: Minimal Assistance - Patient > 75% Max wheelchair distance: 150    Wheelchair 50 feet with 2 turns activity    Assist        Assist Level: Minimal Assistance - Patient > 75%   Wheelchair 150 feet activity     Assist      Assist Level: Minimal Assistance - Patient > 75%   Blood pressure (!) 155/95, pulse 77, temperature (!) 97.5 F (36.4 C), temperature source Oral, resp. rate 18, height 6\' 1"  (1.854 m), weight 64.1 kg, SpO2 99 %.    Medical Problem List and Plan: 1.  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  Plan 30-day cardiac event monitor as outpatient tent d/c 9/19- may need to push back 2-3d to stabilize -discussed with OT, bedside tx until 2nd unit transfused As per nephrology note.  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  This is unlikely to improve at this point 2.  Antithrombotics: -DVT/anticoagulation:  plt on low side with bruising will stop enoxaparin             -antiplatelet therapy: Aspirin 325 mg, Plavix 75 mg daily x3 weeks then aspirin alone, as discussed with neuro will discontinue Plavix and continue ASA 81 mg/day 3. Pain Management: Tylenol as needed 4. Mood: Provide emotional support             -antipsychotic agents: N/A 5. Neuropsych: This patient is capable of making decisions on his own behalf. 6. Skin/Wound Care: Routine skin checks 7. Fluids/Electrolytes/Nutrition: Routine in and outs with follow-up chemistries 8.  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  Monitor for any signs of fluid overload 9.  Hypertension. Permissive, but starting to tighten control   Vitals:   01/27/19 1948 01/28/19 0530  BP: (!) 145/79 (!) 155/95  Pulse: 70 77  Resp: 17 18  Temp: 98.2 F (36.8 C) (!) 97.5 F (36.4 C)  SpO2: 100% 99%  on  amlodipine 5mg  improving will hold off on another increase for now  10.  ANCA necrotizing concentric glomerulonephritis.  Continue Cytoxan as well as chronic prednisone.  Follow-up per renal services.  Admission creatinine of 2.95  -May be switching to Imuran 11.  Pancytopenia/chronic anemia.  Follow-up labs patient had been receiving Aranesp as an outpatient with renal services.  -Responded nicely to second unit of PRBCs 12.  Hyperlipidemia.  Lipitor 13.  History of alcohol use.  Counseling and monitor. 14. Concern for spasticity formation- will con't to monitor- since has elevated DTRs on LLE, is at risk for spasticity development. 15. Diarrhea/Bowel issues/Nausea  9/6- will give Zofran prn 4  mg q8 hours prn and monitor 16. Dispo- home with wife and 24/7 sup 17.  Flexor spasms LLE add tizanidine qhs- reassured pt that sei  18.  Seizure left-sided post stroke.   Todd's paralysis left upper extremityresolved .  CT of the head showed expected evolution . Per Neuro rec changed seizure prophyllaxis to Valproate 750mg  BID with Keppra 500mg  BID.  Had a 1000mg  IV valproate load    Negative  EEG.-As discussed with neurology will get a stat level for valproate and if on the low side may need to increase valproic acid dose.  The patient is also not having generalized seizures but short duration partial seizures which should be relatively well-tolerated.  If the valproic acid level is on the high side of the normal range, may need to add Vimpat, discussed with Dr. Leonie Man Has had only partial seizures without resultant Todd's paralysis- VPA level low increase to 1000mg  BID, give IV VPA bolus of 500mg  , check VPA level 9/16   lOS: 13 days A FACE TO FACE EVALUATION WAS PERFORMED  Charlett Blake 01/28/2019, 9:42 AM

## 2019-01-28 NOTE — Progress Notes (Signed)
Occupational Therapy Session Note  Patient Details  Name: Jeremy Grant MRN: [id number] Date of Birth: 1960/06/09  Today's Date: 01/28/2019 OT Individual Time: 7902-4097 OT Individual Time Calculation (min): 70 min    Short Term Goals: Week 2:  OT Short Term Goal 1 (Week 2): Continue working on established supervision level LTGs.  Skilled Therapeutic Interventions/Progress Updates:    Pt seen for OT treatment with report of being fatigued from previous session with PT.  He was able to complete shaving at the sink with setup to begin session.  He was then taken down to the therapy gym where he transferred stand pivot with use of the RW and min assist to the therapy mat.  Worked on dynamic trunk strengthening in sitting with variations of reaching patterns holding a therapy ball with BUEs as well as unilateral reaching and lateral weightshifts side to side.  Pt with increased LOB when reaching with the ball held bilaterally to the right, requiring mod assist to correct left LOB.  Also had pt work on standing without UE support while working on head movements in extension and rotation.  Min assist to maintain balance as well as left knee extension.  He only tolerated standing for approximately 2 mins before needing to sit secondary to fatigue.  Emphasized upright posture in sitting with anterior to neutral pelvic tilt as well as cervical retraction and extension to neutral.  Finished session with return to the room via wheelchair with min assist stand pivot transfer to the bed and call button and phone in reach with bed alarm in place.    Therapy Documentation Precautions:  Precautions Precautions: Fall Precaution Comments: LLE paresis more severe than in the LUE Restrictions Weight Bearing Restrictions: No  Pain: Pain Assessment Pain Scale: Faces Pain Score: 0-No pain ADL: See Care Tool Section for some details of ADLs  Therapy/Group: Individual Therapy  Tyjon Bowen  OTR/L 01/28/2019, 3:36 PM

## 2019-01-28 NOTE — Progress Notes (Signed)
[facility] Progress Note    Assessment/ Plan:    1.  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  He was initially treated with pulse-dose steroids followed by taper and cyclophosphamide which he is still on He has achieved remission.  UA is unremarkable, renal function has recovered (still with some resulting CKD), and UP/C 1.5.  MPO ANCA titer is 18.5, initially > 100.  It is coming up to decide his maintenance therapy course (to continue for approximately 18-24 months).  Given history of severe AKI which required dialysis, observation without maintenance therapy is not appropriate.  He has a lesser risk of relapse as opposed to PR-3 ANCA vasculitis.  His options for maintenance are rituximab, Imuran (azathioprine), or Cellcept.  Rituximab has better long-term remission rates as opposed to AZA or Cellcept. There is a practical concern with Ritux right now- if pt had a seizure while receiving infusion, would be impossible to tell if an infusion reaction were taking place vs seizure and could preclude him from receiving therapy with rituximab in the future if he had a relapse.  So- I think I will choose to hold on ritux for now.  AZA is superior to cellcept.  No concerns with drug/ drug interactions other than additive effect with depakote and keppra.  Will d/c cytoxan, allow to wash out for 24-48 hrs, and then begin imuran.  Full labs tomorrow including CBC, CMP, G6PD, and TMPT (have ordered).  2.  Anemia: Getting 2 u pRBCs, Aranesp dosed for this evening.  3.  Seizures: keppra and valproate  4.  S/p ACA stroke: on ASA and Plavix  Subjective:    Hgb up to 7.9 this AM.  Has good UOP.  Appreciate pharmacy expertise re: potential interactions with imuran.     Objective:   BP (!) 137/99 (BP Location: Left Arm)   Pulse 100   Temp 98.2 F (36.8 C) (Oral)   Resp 18   Ht 6\' 1"  (1.854 m)   Wt 64.1 kg   SpO2 100%   BMI 18.64  kg/m   Intake/Output Summary (Last 24 hours) at 01/28/2019 1744 Last data filed at 01/28/2019 1640 Gross per 24 hour  Intake 702 ml  Output 2050 ml  Net -1348 ml   Weight change: -0.1 kg  Physical Exam: GEN NAD, lying in bed flat HEENT EOMI PERRL NECK no JVD PULM clear bilaterally no c/w/r CV RRR soft systolic murmur no r/g ABD soft, nontender, NABS EXT no LE edema NEURO AAO x 3 SKIN multiple ecchymoses but no rash  Imaging: No results found.  Labs: BMET Recent Labs  Lab 01/22/19 0516 01/26/19 0745  NA  --  141  K  --  3.9  CL  --  110  CO2  --  21*  GLUCOSE  --  190*  BUN  --  67*  CREATININE 3.28* 2.85*  CALCIUM  --  8.8*   CBC Recent Labs  Lab 01/26/19 0745 01/26/19 1017 01/27/19 0349 01/27/19 1647  WBC 4.2 4.9 4.2 4.8  NEUTROABS 3.4  --   --   --   HGB 6.5* 5.9* 6.6* 7.9*  HCT 18.6* 16.9* 18.4* 22.3*  MCV 101.6* 102.4* 96.8 94.9  PLT 129* 129* 117* 106*    Medications:    . amLODipine  5 mg Oral Daily  . aspirin EC  81 mg Oral Daily  . atorvastatin  80 mg Oral q1800  . darbepoetin (ARANESP) injection -  NON-DIALYSIS  100 mcg Subcutaneous Q Tue-1800  . divalproex  1,000 mg Oral Q12H  . levETIRAcetam  500 mg Oral BID  . predniSONE  5 mg Oral Q breakfast  . tiZANidine  2 mg Oral QHS      Madelon Lips, MD 01/28/2019, 5:44 PM

## 2019-01-28 NOTE — Progress Notes (Signed)
Social Work Patient ID: Jeremy Grant, male   DOB: [birth year], 59 y.o.   MRN: [id number]  Met with pt and wife to discuss team conference goals supervision-CGA level and the need to extend his discharge date due to medical issues and missed therapies. The new discharge date is 9/23. Both are comfortable with this new date and wife plans to be with him at the beginning, but will need to go back to work.

## 2019-01-29 ENCOUNTER — Inpatient Hospital Stay (HOSPITAL_COMMUNITY): Payer: BC Managed Care – PPO | Admitting: Occupational Therapy

## 2019-01-29 ENCOUNTER — Inpatient Hospital Stay (HOSPITAL_COMMUNITY): Payer: BC Managed Care – PPO | Admitting: Physical Therapy

## 2019-01-29 LAB — COMPREHENSIVE METABOLIC PANEL
ALT: 27 U/L (ref 0–44)
AST: 18 U/L (ref 15–41)
Albumin: 3.1 g/dL — ABNORMAL LOW (ref 3.5–5.0)
Alkaline Phosphatase: 34 U/L — ABNORMAL LOW (ref 38–126)
Anion gap: 10 (ref 5–15)
BUN: 83 mg/dL — ABNORMAL HIGH (ref 6–20)
CO2: 22 mmol/L (ref 22–32)
Calcium: 8.4 mg/dL — ABNORMAL LOW (ref 8.9–10.3)
Chloride: 107 mmol/L (ref 98–111)
Creatinine, Ser: 2.84 mg/dL — ABNORMAL HIGH (ref 0.61–1.24)
GFR calc Af Amer: 27 mL/min — ABNORMAL LOW (ref >60)
GFR calc non Af Amer: 23 mL/min — ABNORMAL LOW (ref >60)
Glucose, Bld: 147 mg/dL — ABNORMAL HIGH (ref 70–99)
Potassium: 3.5 mmol/L (ref 3.5–5.1)
Sodium: 139 mmol/L (ref 135–145)
Total Bilirubin: 0.5 mg/dL (ref 0.3–1.2)
Total Protein: 5.1 g/dL — ABNORMAL LOW (ref 6.5–8.1)

## 2019-01-29 LAB — DIFFERENTIAL
Abs Immature Granulocytes: 0.09 10*3/uL — ABNORMAL HIGH (ref 0.00–0.07)
Basophils Absolute: 0 10*3/uL (ref 0.0–0.1)
Basophils Relative: 1 %
Eosinophils Absolute: 0.1 10*3/uL (ref 0.0–0.5)
Eosinophils Relative: 2 %
Immature Granulocytes: 2 %
Lymphocytes Relative: 9 %
Lymphs Abs: 0.3 10*3/uL — ABNORMAL LOW (ref 0.7–4.0)
Monocytes Absolute: 0.5 10*3/uL (ref 0.1–1.0)
Monocytes Relative: 12 %
Neutro Abs: 2.9 10*3/uL (ref 1.7–7.7)
Neutrophils Relative %: 74 %

## 2019-01-29 LAB — CBC
HCT: 16.5 % — ABNORMAL LOW (ref 39.0–52.0)
HCT: 16.3 % — ABNORMAL LOW (ref 39.0–52.0)
Hemoglobin: 5.8 g/dL — CL (ref 13.0–17.0)
Hemoglobin: 6 g/dL — CL (ref 13.0–17.0)
MCH: 34.1 pg — ABNORMAL HIGH (ref 26.0–34.0)
MCH: 35.7 pg — ABNORMAL HIGH (ref 26.0–34.0)
MCHC: 35.2 g/dL (ref 30.0–36.0)
MCHC: 36.8 g/dL — ABNORMAL HIGH (ref 30.0–36.0)
MCV: 97.1 fL (ref 80.0–100.0)
MCV: 97 fL (ref 80.0–100.0)
Platelets: 106 10*3/uL — ABNORMAL LOW (ref 150–400)
Platelets: 105 10*3/uL — ABNORMAL LOW (ref 150–400)
RBC: 1.7 MIL/uL — ABNORMAL LOW (ref 4.22–5.81)
RBC: 1.68 MIL/uL — ABNORMAL LOW (ref 4.22–5.81)
RDW: 15.7 % — ABNORMAL HIGH (ref 11.5–15.5)
RDW: 15.9 % — ABNORMAL HIGH (ref 11.5–15.5)
WBC: 3.8 10*3/uL — ABNORMAL LOW (ref 4.0–10.5)
WBC: 3.8 10*3/uL — ABNORMAL LOW (ref 4.0–10.5)
nRBC: 0 % (ref 0.0–0.2)
nRBC: 0 % (ref 0.0–0.2)

## 2019-01-29 LAB — HEMOGLOBIN AND HEMATOCRIT, BLOOD
HCT: 18 % — ABNORMAL LOW (ref 39.0–52.0)
Hemoglobin: 6.7 g/dL — CL (ref 13.0–17.0)

## 2019-01-29 LAB — C4 COMPLEMENT: Complement C4, Body Fluid: 14 mg/dL (ref 14–44)

## 2019-01-29 LAB — C3 COMPLEMENT: C3 Complement: 85 mg/dL (ref 82–167)

## 2019-01-29 LAB — PREPARE RBC (CROSSMATCH)

## 2019-01-29 LAB — GLUCOSE, CAPILLARY: Glucose-Capillary: 122 mg/dL — ABNORMAL HIGH (ref 70–99)

## 2019-01-29 MED ORDER — SODIUM CHLORIDE 0.9% IV SOLUTION
Freq: Once | INTRAVENOUS | Status: AC
  Administered 2019-01-29: 14:00:00 via INTRAVENOUS

## 2019-01-29 NOTE — Progress Notes (Addendum)
[facility] Progress Note    Assessment/ Plan:    1.  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  He was initially treated with pulse-dose steroids followed by taper and cyclophosphamide which he is still on He has achieved remission.  UA is unremarkable, renal function has recovered (still with some resulting CKD), and UP/C 1.5.  MPO ANCA titer is 18.5, initially > 100.  It is coming up to decide his maintenance therapy course (to continue for approximately 18-24 months).  Given history of severe AKI which required dialysis, observation without maintenance therapy is not appropriate.  He has a lesser risk of relapse as opposed to PR-3 ANCA vasculitis.  His options for maintenance are rituximab, Imuran (azathioprine), or Cellcept.  Rituximab has better long-term remission rates as opposed to AZA or Cellcept. There is a practical concern with Ritux right now- if pt had a seizure while receiving infusion, would be impossible to tell if an infusion reaction were taking place vs seizure and could preclude him from receiving therapy with rituximab in the future if he had a relapse.  So- I think I will choose to hold on ritux for now.  AZA is superior to cellcept.  No concerns with drug/ drug interactions other than additive effect with depakote and keppra.  Will d/c cytoxan, allow to wash out for 24-48 hrs, and then begin imuran.  Full labs today CBC, CMP, G6PD, and TMPT (have ordered).  2.  Anemia: s/p 2 u pRBCs, Aranesp dosed 9/15, has falling Hgb again, FOBT + on 9/12, agree with GI consult.  Will order another 1 u pRBCs. He has a mild pancytopenia d/t likely med effect (cytoxan), but if nothing is found with GI evaluation may need to consider hematological etiology of anemia   3.  Seizures: keppra and valproate  4.  S/p ACA stroke: on ASA, d/c'd Plavix.  5.  Dispo: in rehab    Subjective:    Hgb 5.8-->6.7 on stat recheck this AM.   FOBT was + from 9/12.  Per notes, GI to be consulted.       Objective:   BP (!) 144/82 (BP Location: Right Arm)   Pulse 79   Temp 97.6 F (36.4 C) (Oral)   Resp 16   Ht 6\' 1"  (1.854 m)   Wt 63 kg   SpO2 100%   BMI 18.32 kg/m   Intake/Output Summary (Last 24 hours) at 01/29/2019 1203 Last data filed at 01/29/2019 0736 Gross per 24 hour  Intake 480 ml  Output 1700 ml  Net -1220 ml   Weight change: -1.1 kg  Physical Exam: GEN NAD, lying in bed flat, appears tired HEENT EOMI PERRL NECK no JVD PULM clear bilaterally no c/w/r CV RRR soft systolic murmur no r/g ABD soft, nontender, NABS EXT no LE edema NEURO AAO x 3 SKIN multiple ecchymoses but no rash, heparin shots with some bruising but no frank hematomas.  Imaging: No results found.  Labs: BMET Recent Labs  Lab 01/26/19 0745 01/29/19 0636  NA 141 139  K 3.9 3.5  CL 110 107  CO2 21* 22  GLUCOSE 190* 147*  BUN 67* 83*  CREATININE 2.85* 2.84*  CALCIUM 8.8* 8.4*   CBC Recent Labs  Lab 01/26/19 0745 01/26/19 1017 01/27/19 0349 01/27/19 1647 01/29/19 0636 01/29/19 0812  WBC 4.2 4.9 4.2 4.8 3.8*  --   NEUTROABS 3.4  --   --   --   --   --  HGB 6.5* 5.9* 6.6* 7.9* 5.8* 6.7*  HCT 18.6* 16.9* 18.4* 22.3* 16.5* 18.0*  MCV 101.6* 102.4* 96.8 94.9 97.1  --   PLT 129* 129* 117* 106* 106*  --     Medications:    . amLODipine  5 mg Oral Daily  . aspirin EC  81 mg Oral Daily  . atorvastatin  80 mg Oral q1800  . darbepoetin (ARANESP) injection - NON-DIALYSIS  100 mcg Subcutaneous Q Tue-1800  . divalproex  1,000 mg Oral Q12H  . levETIRAcetam  500 mg Oral BID  . predniSONE  5 mg Oral Q breakfast  . tiZANidine  2 mg Oral QHS      Madelon Lips, MD 01/29/2019, 12:03 PM

## 2019-01-29 NOTE — Progress Notes (Signed)
Recreational Therapy Session Note  Patient Details  Name: Jeremy Grant MRN: [id number] Date of Birth: Jan 19, 1961 Today's Date: 01/29/2019 Time:  840-9 Pain: no c/o Skilled Therapeutic Interventions/Progress Updates: Pt seen at bedside as pt's Hgb is low this morning.  Had planned to practice community reintegration tasks but pt unable, so TR goals deferred as pt is expected to discharge 9/23 prior to LRT's return to work.  Continued education on activity analysis with modifications & relaxation strategies.  Handout provided per pts request on diaphragmatic breathing.    Therapy/Group: Individual Therapy  Judene Logue 01/29/2019, 9:45 AM

## 2019-01-29 NOTE — Progress Notes (Signed)
Occupational Therapy Session Note  Patient Details  Name: Jeremy Grant MRN: [id number] Date of Birth: [birth year]  Today's Date: 01/29/2019 OT Individual Time: 6144-3154 OT Individual Time Calculation (min): 56 min    Short Term Goals: Week 2:  OT Short Term Goal 1 (Week 2): Continue working on established supervision level LTGs.  Skilled Therapeutic Interventions/Progress Updates:    Pt with limited session secondary to decreased HGB and MD verbal recommendation.  He was able to transfer from supine to sit EOB with supervision.  Once in sitting he was able to work on washing his hair and his UB with supervision, including donning a pullover shirt.  He was able to complete LB bathing sit to stand with min assist as well as for donning shorts.  Still with increased cervical flexion as well as thoracic flexion in sitting.  He finished ADL tasks with completion of oral hygiene in sitting with setup.  Next he worked on upright posture in sitting with focus on maintaining anterior pelvic tilt as well as come cervical extension to neutral with retraction.  Therapist also completed scapular mobilizations to the left scapula and pt completed scapular adduction AAROM in sitting along with completion of anterior pelvic tilt and upright sitting posture.  He was able to complete shoulder flexion bilaterally in supine using 1 lb dowel rod.  He needed mod instructional cueing and min assist occasionally in order to keep the LUE in alignment and moving symmetrically with the right during shoulder flexion.  Increased elbow flexion noted in the LUE with all movements and pt demonstrating decreased awareness of this.  Finished session with pt in the bed with call button and phone in reach with safety alarm in place.    Therapy Documentation Precautions:  Precautions Precautions: Fall Precaution Comments: LLE paresis more severe than in the LUE Restrictions Weight Bearing Restrictions: No  Pain: Pain  Assessment Pain Scale: Faces Faces Pain Scale: No hurt ADL: See Care Tool Section for more details of ADL  Therapy/Group: Individual Therapy  Mcdaniel Ohms OTR/L 01/29/2019, 12:41 PM

## 2019-01-29 NOTE — Progress Notes (Signed)
[facility] PHYSICAL MEDICINE & REHABILITATION PROGRESS NOTE   Subjective/Complaints:  Appreciate nephrology note as well as pharmacy note. No new bruising  No further seizures Feels ok, no blood noted in BM or in urine  ROS- denies CP, SOB, N/V/D Objective:   No results found. Recent Labs    01/27/19 1647 01/29/19 0636  WBC 4.8 3.8*  HGB 7.9* 5.8*  HCT 22.3* 16.5*  PLT 106* 106*   Recent Labs    01/26/19 0745 01/29/19 0636  NA 141 139  K 3.9 3.5  CL 110 107  CO2 21* 22  GLUCOSE 190* 147*  BUN 67* 83*  CREATININE 2.85* 2.84*  CALCIUM 8.8* 8.4*    Intake/Output Summary (Last 24 hours) at 01/29/2019 0744 Last data filed at 01/29/2019 0736 Gross per 24 hour  Intake 600 ml  Output 1700 ml  Net -1100 ml     Physical Exam: Vital Signs Blood pressure (!) 144/82, pulse 79, temperature 97.6 F (36.4 C), temperature source Oral, resp. rate 16, height 6\' 1"  (1.854 m), weight 63 kg, SpO2 100 %.  General: No acute distress Sitting up in manual w/c in room, watching TV, appropriate, NAD;  Mood and affect are appropriate Pale conjunctiva B/L Heart:RRR no rubs or murmurs Lungs: Clear to auscultation B/L Abdomen: hyperactive bowel sounds, soft, nontender to palpation, nondistended Extremities: No clubbing, cyanosis, or edema Skin: No evidence of breakdown, no evidence of rash Neurologic: Cranial nerves II through XII intact, motor strength is 5/5 inright 4- Left  deltoid, bicep, tricep, grip,3- Left  hip flexor, knee extensors,0/5 Left  ankle dorsiflexor and plantar flexor, 5/5 RLE  unchangedSensory exam normal sensation to light touch and proprioception in bilateral upper and lower extremities  Tone MAS 2 at Left hamstrings Musculoskeletal: Full range of motion in all 4 extremities. No joint swelling, no pain with shoulder ROM     Assessment/Plan: 1. Functional deficits secondary to RIght ACA infarct  which require 3+ hours per day of interdisciplinary therapy in a  comprehensive inpatient rehab setting.  Physiatrist is providing close team supervision and 24 hour management of active medical problems listed below.  Physiatrist and rehab team continue to assess barriers to discharge/monitor patient progress toward functional and medical goals  Care Tool:  Bathing  Bathing activity did not occur: Refused Body parts bathed by patient: Right arm, Left arm, Chest, Abdomen, Right upper leg, Left upper leg, Right lower leg, Left lower leg, Face, Front perineal area, Buttocks   Body parts bathed by helper: Front perineal area, Buttocks     Bathing assist Assist Level: Minimal Assistance - Patient > 75%     Upper Body Dressing/Undressing Upper body dressing   What is the patient wearing?: Pull over shirt    Upper body assist Assist Level: Supervision/Verbal cueing    Lower Body Dressing/Undressing Lower body dressing      What is the patient wearing?: Pants     Lower body assist Assist for lower body dressing: Minimal Assistance - Patient > 75%     Toileting Toileting    Toileting assist Assist for toileting: Independent with assistive device Assistive Device Comment: urinal   Transfers Chair/bed transfer  Transfers assist     Chair/bed transfer assist level: Minimal Assistance - Patient > 75%     Locomotion Ambulation   Ambulation assist      Assist level: Minimal Assistance - Patient > 75% Assistive device: Walker-rolling Max distance: 5'   Walk 10 feet activity   Assist  Assist level: Minimal Assistance - Patient > 75% Assistive device: Walker-rolling   Walk 50 feet activity   Assist    Assist level: Contact Guard/Touching assist Assistive device: Walker-rolling    Walk 150 feet activity   Assist Walk 150 feet activity did not occur: Safety/medical concerns  Assist level: Minimal Assistance - Patient > 75% Assistive device: Walker-rolling    Walk 10 feet on uneven surface   activity   Assist Walk 10 feet on uneven surfaces activity did not occur: Safety/medical concerns         Wheelchair     Assist Will patient use wheelchair at discharge?: Yes Type of Wheelchair: Manual    Wheelchair assist level: Minimal Assistance - Patient > 75% Max wheelchair distance: 150    Wheelchair 50 feet with 2 turns activity    Assist        Assist Level: Minimal Assistance - Patient > 75%   Wheelchair 150 feet activity     Assist      Assist Level: Minimal Assistance - Patient > 75%   Blood pressure (!) 144/82, pulse 79, temperature 97.6 F (36.4 C), temperature source Oral, resp. rate 16, height 6\' 1"  (1.854 m), weight 63 kg, SpO2 100 %.    Medical Problem List and Plan: 1.  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  Plan 30-day cardiac event monitor as outpatient tent d/c 9/23-As per nephrology note.  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  This is unlikely to improve at this point 2.  Antithrombotics: -DVT/anticoagulation:  plt on low side with bruising will stop enoxaparin             -antiplatelet therapy: Aspirin 325 mg, Plavix 75 mg daily x3 weeks then aspirin alone, as discussed with neuro will discontinue Plavix and continue ASA 81 mg/day 3. Pain Management: Tylenol as needed 4. Mood: Provide emotional support             -antipsychotic agents: N/A 5. Neuropsych: This patient is capable of making decisions on his own behalf. 6. Skin/Wound Care: Routine skin checks 7. Fluids/Electrolytes/Nutrition: Routine in and outs with follow-up chemistries 8.  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  Monitor for any signs of fluid overload 9.  Hypertension. Permissive, but starting to tighten control   Vitals:   01/28/19 1930 01/29/19 0433  BP: (!) 143/79  (!) 144/82  Pulse: 95 79  Resp: 18 16  Temp: 98.3 F (36.8 C) 97.6 F (36.4 C)  SpO2: 100% 100%  on  amlodipine 5mg  improving will hold off on another increase for now  10.  ANCA necrotizing concentric glomerulonephritis.  Continue Cytoxan as well as chronic prednisone.  Follow-up per renal services.  Admission creatinine of 2.95  -May be switching to Imuran 11.  Pancytopenia/chronic anemia.  Follow-up labs patient had been receiving Aranesp as an outpatient with renal services.  -Responded nicely to second unit of PRBCs but dropped again will do stat H and H if no change will need to transfuse and consult GI 12.  Hyperlipidemia.  Lipitor 13.  History of alcohol use.  Counseling and monitor. 14. Concern for spasticity formation- will con't to monitor- since has elevated DTRs on LLE, is at risk for spasticity development. 15. Diarrhea/Bowel issues/Nausea  9/6- will give Zofran prn 4 mg q8 hours prn and monitor 16. Dispo- home with wife and  24/7 sup 17.  Flexor spasms LLE add tizanidine qhs- reassured pt that sei  18.  Seizure left-sided post stroke.   Todd's paralysis left upper extremityresolved .  CT of the head showed expected evolution . Per Neuro rec changed seizure prophyllaxis to Valproate 750mg  BID with Keppra 500mg  BID.  Had a 1000mg  IV valproate load    Negative  EEG.-As discussed with neurology will get a stat level for valproate and if on the low side may need to increase valproic acid dose.  The patient is also not having generalized seizures but short duration partial seizures which should be relatively well-tolerated.  If the valproic acid level is on the high side of the normal range, may need to add Vimpat, discussed with Dr. Leonie Man Has had only partial seizures without resultant Todd's paralysis- VPA level low increase to 1000mg  BID, give IV VPA bolus of 500mg  , repeat VPA low but pt seizure free, will repeat next week dose has been increased   lOS: 14 days A FACE TO FACE  EVALUATION WAS PERFORMED  Charlett Blake 01/29/2019, 7:44 AM

## 2019-01-29 NOTE — Progress Notes (Signed)
Recreational Therapy Discharge Summary Patient Details  Name: JATHNIEL SMELTZER MRN: [id number] Date of Birth: 1960-12-20 Today's Date: 01/29/2019  Comments on progress toward goals: Pt is being discharged from TR services with goals being deferred due to medical issues (low Hbg).  LRT will not return to work to work toward goal achievement prior to anticipated discharge date.  Education provided with pt and wife on activity analysis with potential modifications & relaxation training.  Both very appreciative of information and hopeful for pt to return to previously enjoyed activities soon. Reasons for discharge: discharge from hospital  Patient/family agrees with progress made and goals achieved: Yes  Jair Lindblad 01/29/2019, 9:51 AM

## 2019-01-29 NOTE — Progress Notes (Signed)
Physical Therapy Session Note  Patient Details  Name: Jeremy Grant MRN: [id number] Date of Birth: 1960/08/15  Today's Date: 01/29/2019 PT Individual Time: 3202-3343 PT Individual Time Calculation (min): 9 min   and  Today's Date: 01/29/2019 PT Missed Time: 71 Minutes and 60 minutes  Missed Time Reason: Patient fatigue;Patient ill (Comment)(Hgb 5.8g/dL) (Hgb 6.0g/dL)  Short Term Goals: Week 2:  PT Short Term Goal 1 (Week 2): = to LTGs based on ELOS  Skilled Therapeutic Interventions/Progress Updates:    Session 1: Therapist spoke with Dr. Letta Pate, MD regarding pt's Hgb of 5.8g/dL and he cleared pt to participate in bed side therapy. Pt received sitting in w/c reporting he had assistance from nursing to go to the bathroom this AM and chose to remain up in his chair as he was looking forward to participating in therapy; however, pt now stating "I am very tired and just can't exercise when I feel this way." Pt requesting for assistance to return to bed.and rest. Stand pivot w/c>EOB, no AD, with mod assist for balance and pivoting hips - mod cuing for sequencing of LE stepping and turning. Sit>supine with min assist for L LE management. Lab technician arrived for another blood draw and pt left supine in bed with needs in reach and bed alarm on. Missed 57 minutes of skilled physical therapy.  Session 2: Pt received L sidelying in bed with lights off and pt currently receiving blood transfusion - most recent Hgb 6.0g/dL. Pt awake and stating he is really disappointed by everything that has happened stating "the seizures and now this...the doctor says I must be bleeding out somewhere." Therapist provided emotional support and encouragement as to how pt has been able to continue participating in therapy as much as possible and overcome these medical complications. Pt tearful stating "I don't give up" but requesting to rest today due to continued significant fatigue stating he wants to try again  tomorrow. Pt left sidelying in bed with needs in reach and bed alarm on. Missed 60 minutes of skilled physical therapy.  Therapy Documentation Precautions:  Precautions Precautions: Fall Precaution Comments: LLE paresis more severe than in the LUE Restrictions Weight Bearing Restrictions: No  Pain:   Session 1: No reports of pain.  Session 2: No reports of pain.   Therapy/Group: Individual Therapy  Tawana Scale, PT, DPT 01/29/2019, 7:48 AM

## 2019-01-30 ENCOUNTER — Inpatient Hospital Stay (HOSPITAL_COMMUNITY): Payer: BC Managed Care – PPO | Admitting: Occupational Therapy

## 2019-01-30 ENCOUNTER — Inpatient Hospital Stay (HOSPITAL_COMMUNITY): Payer: BC Managed Care – PPO | Admitting: Physical Therapy

## 2019-01-30 LAB — BPAM RBC
Blood Product Expiration Date: 202009192359
Blood Product Expiration Date: 202009222359
Blood Product Expiration Date: 202009232359
ISSUE DATE / TIME: 202009141344
ISSUE DATE / TIME: 202009151059
ISSUE DATE / TIME: 202009171414
Unit Type and Rh: 9500
Unit Type and Rh: 9500
Unit Type and Rh: 9500

## 2019-01-30 LAB — TYPE AND SCREEN
ABO/RH(D): O NEG
Antibody Screen: NEGATIVE
Unit division: 0
Unit division: 0
Unit division: 0

## 2019-01-30 LAB — HEMOGLOBIN AND HEMATOCRIT, BLOOD
HCT: 20.5 % — ABNORMAL LOW (ref 39.0–52.0)
Hemoglobin: 7.5 g/dL — ABNORMAL LOW (ref 13.0–17.0)

## 2019-01-30 LAB — GLUCOSE 6 PHOSPHATE DEHYDROGENASE
G6PDH: 11.1 U/g{Hb} (ref 5.5–14.2)
Hemoglobin: 5.7 g/dL — CL (ref 13.0–17.7)

## 2019-01-30 NOTE — Anesthesia Preprocedure Evaluation (Addendum)
Anesthesia Evaluation  Patient identified by MRN, date of birth, ID band Patient awake    Reviewed: Allergy & Precautions, NPO status , Patient's Chart, lab work & pertinent test results  History of Anesthesia Complications Negative for: history of anesthetic complications  Airway Mallampati: II  TM Distance: >3 FB Neck ROM: Full    Dental  (+) Chipped, Dental Advisory Given, Missing   Pulmonary neg pulmonary ROS,  01/12/2019 SARS coronavirus NEG   breath sounds clear to auscultation       Cardiovascular hypertension, Pt. on medications +CHF (h/o non-ischemic cardiomyopathy)   Rhythm:Regular Rate:Normal  01/12/2019 ECHO: EF 55%, valves OK   Neuro/Psych CVA (L sided weakness), Residual Symptoms    GI/Hepatic Neg liver ROS, Presumed GI bleed   Endo/Other  negative endocrine ROS  Renal/GU Renal InsufficiencyRenal disease (creat 2.84)     Musculoskeletal  (+) Arthritis , Osteoarthritis,    Abdominal   Peds  Hematology  (+) Blood dyscrasia (Hb 7.5, plt 105k), anemia , Multiple myeloma   Anesthesia Other Findings   Reproductive/Obstetrics                            Anesthesia Physical Anesthesia Plan  ASA: III  Anesthesia Plan: MAC   Post-op Pain Management:    Induction: Intravenous  PONV Risk Score and Plan: 1 and Treatment may vary due to age or medical condition  Airway Management Planned: Natural Airway and Nasal Cannula  Additional Equipment:   Intra-op Plan:   Post-operative Plan:   Informed Consent: I have reviewed the patients History and Physical, chart, labs and discussed the procedure including the risks, benefits and alternatives for the proposed anesthesia with the patient or authorized representative who has indicated his/her understanding and acceptance.     Dental advisory given  Plan Discussed with: CRNA and Surgeon  Anesthesia Plan Comments:         Anesthesia Quick Evaluation  

## 2019-01-30 NOTE — Progress Notes (Signed)
Jeremy Grant Progress Note    Assessment/ Plan:    1.  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  He was initially treated with pulse-dose steroids followed by taper and cyclophosphamide which he is still on He has achieved remission.  UA is unremarkable, renal function has recovered (still with some resulting CKD), and UP/C 1.5.  MPO ANCA titer is 18.5, initially > 100.  It is coming up to decide his maintenance therapy course (to continue for approximately 18-24 months).  Given history of severe AKI which required dialysis, observation without maintenance therapy is not appropriate.  He has a lesser risk of relapse as opposed to PR-3 ANCA vasculitis.  His options for maintenance are rituximab, Imuran (azathioprine), or Cellcept.  Rituximab has better long-term remission rates as opposed to AZA or Cellcept. There is a practical concern with Ritux right now- if pt had a seizure while receiving infusion, would be impossible to tell if an infusion reaction were taking place vs seizure and could preclude him from receiving therapy with rituximab in the future if he had a relapse.  So- I think I will choose to hold on ritux for now.  AZA is superior to cellcept.  No concerns with drug/ drug interactions other than additive effect with depakote and keppra.  Will d/c cytoxan, allow to wash out for 24-48 hrs, and then begin imuran.  G6PD and TPMT pending  2.  Anemia: s/p 2 u pRBCs, Aranesp dosed 9/15, has falling Hgb again, FOBT + on 9/12, agree with GI consult.  Will order another 1 u pRBCs. He has a mild pancytopenia d/t likely med effect (cytoxan), but if nothing is found with GI evaluation may need to consider hematological etiology of anemia.  Getting EGD tomorrow.    3.  Seizures: keppra and valproate  4.  S/p ACA stroke: on ASA, d/c'd Plavix.   5.  Dispo: in rehab    Subjective:    For EGD tomorrow.  Hgb up to 7.5.  Pt is nervous-  discussed need to figure out where blood going.    Objective:   BP (!) 148/79 (BP Location: Left Arm)   Pulse 84   Temp 98.7 F (37.1 C) (Oral)   Resp 19   Ht 6\' 1"  (1.854 m)   Wt 65 kg   SpO2 99%   BMI 18.91 kg/m   Intake/Output Summary (Last 24 hours) at 01/30/2019 1836 Last data filed at 01/30/2019 1813 Gross per 24 hour  Intake 920 ml  Output 2227 ml  Net -1307 ml   Weight change: 2 kg  Physical Exam: GEN NAD, sitting up in bed, NAD HEENT EOMI PERRL NECK no JVD PULM clear bilaterally no c/w/r CV RRR soft systolic murmur no r/g ABD soft, nontender, NABS EXT no LE edema NEURO AAO x 3 SKIN multiple ecchymoses but no rash, heparin shots with some bruising but no frank hematomas.  Imaging: No results found.  Labs: BMET Recent Labs  Lab 01/26/19 0745 01/29/19 0636  NA 141 139  K 3.9 3.5  CL 110 107  CO2 21* 22  GLUCOSE 190* 147*  BUN 67* 83*  CREATININE 2.85* 2.84*  CALCIUM 8.8* 8.4*   CBC Recent Labs  Lab 01/26/19 0745  01/27/19 0349 01/27/19 1647 01/29/19 0636 01/29/19 0812 01/29/19 1244 01/30/19 0707  WBC 4.2   < > 4.2 4.8 3.8*  --  3.8*  --   NEUTROABS 3.4  --   --   --   --   --  2.9  --   HGB 6.5*   < > 6.6* 7.9* 5.8*  5.7* 6.7* 6.0* 7.5*  HCT 18.6*   < > 18.4* 22.3* 16.5* 18.0* 16.3* 20.5*  MCV 101.6*   < > 96.8 94.9 97.1  --  97.0  --   PLT 129*   < > 117* 106* 106*  --  105*  --    < > = values in this interval not displayed.    Medications:    . amLODipine  5 mg Oral Daily  . aspirin EC  81 mg Oral Daily  . atorvastatin  80 mg Oral q1800  . darbepoetin (ARANESP) injection - NON-DIALYSIS  100 mcg Subcutaneous Q Tue-1800  . divalproex  1,000 mg Oral Q12H  . levETIRAcetam  500 mg Oral BID  . predniSONE  5 mg Oral Q breakfast  . tiZANidine  2 mg Oral QHS      Madelon Lips, MD 01/30/2019, 6:36 PM

## 2019-01-30 NOTE — Progress Notes (Signed)
[facility] PHYSICAL MEDICINE & REHABILITATION PROGRESS NOTE   Subjective/Complaints:  Received 1 U PRBC yesterday. No abd pain, stools a little loose .  No problem with appetite ROS- denies CP, SOB, N/V/D Objective:   No results found. Recent Labs    01/29/19 0636 01/29/19 0812 01/29/19 1244  WBC 3.8*  --  3.8*  HGB 5.8* 6.7* 6.0*  HCT 16.5* 18.0* 16.3*  PLT 106*  --  105*   Recent Labs    01/29/19 0636  NA 139  K 3.5  CL 107  CO2 22  GLUCOSE 147*  BUN 83*  CREATININE 2.84*  CALCIUM 8.4*    Intake/Output Summary (Last 24 hours) at 01/30/2019 0716 Last data filed at 01/30/2019 0641 Gross per 24 hour  Intake 915 ml  Output 2327 ml  Net -1412 ml     Physical Exam: Vital Signs Blood pressure (!) 147/75, pulse 66, temperature 98.5 F (36.9 C), temperature source Oral, resp. rate 18, height 6\' 1"  (1.854 m), weight 65 kg, SpO2 99 %.  General: No acute distress Sitting up in manual w/c in room, watching TV, appropriate, NAD;  Mood and affect are appropriate Pale conjunctiva B/L Heart:RRR no rubs or murmurs Lungs: Clear to auscultation B/L Abdomen: hyperactive bowel sounds, soft, nontender to palpation, nondistended Extremities: No clubbing, cyanosis, or edema Skin: ecchymosis on limbs , also at lovenox injection sites on abd Neurologic: Cranial nerves II through XII intact, motor strength is 5/5 inright 4- Left  deltoid, bicep, tricep, grip,3- Left  hip flexor, knee extensors,0/5 Left  ankle dorsiflexor and plantar flexor, 5/5 RLE  unchangedSensory exam normal sensation to light touch and proprioception in bilateral upper and lower extremities  Tone MAS 2 at Left hamstrings Musculoskeletal: Full range of motion in all 4 extremities. No joint swelling, no pain with shoulder ROM     Assessment/Plan: 1. Functional deficits secondary to RIght ACA infarct  which require 3+ hours per day of interdisciplinary therapy in a comprehensive inpatient rehab  setting.  Physiatrist is providing close team supervision and 24 hour management of active medical problems listed below.  Physiatrist and rehab team continue to assess barriers to discharge/monitor patient progress toward functional and medical goals  Care Tool:  Bathing  Bathing activity did not occur: Refused Body parts bathed by patient: Right arm, Left arm, Chest, Abdomen, Right upper leg, Left upper leg, Right lower leg, Left lower leg, Face, Front perineal area, Buttocks   Body parts bathed by helper: Front perineal area, Buttocks     Bathing assist Assist Level: Minimal Assistance - Patient > 75%     Upper Body Dressing/Undressing Upper body dressing   What is the patient wearing?: Pull over shirt    Upper body assist Assist Level: Supervision/Verbal cueing    Lower Body Dressing/Undressing Lower body dressing      What is the patient wearing?: Pants     Lower body assist Assist for lower body dressing: Minimal Assistance - Patient > 75%     Toileting Toileting    Toileting assist Assist for toileting: Independent with assistive device Assistive Device Comment: urinal   Transfers Chair/bed transfer  Transfers assist     Chair/bed transfer assist level: Moderate Assistance - Patient 50 - 74%     Locomotion Ambulation   Ambulation assist      Assist level: Minimal Assistance - Patient > 75% Assistive device: Walker-rolling Max distance: 5'   Walk 10 feet activity   Assist     Assist  level: Minimal Assistance - Patient > 75% Assistive device: Walker-rolling   Walk 50 feet activity   Assist    Assist level: Contact Guard/Touching assist Assistive device: Walker-rolling    Walk 150 feet activity   Assist Walk 150 feet activity did not occur: Safety/medical concerns  Assist level: Minimal Assistance - Patient > 75% Assistive device: Walker-rolling    Walk 10 feet on uneven surface  activity   Assist Walk 10 feet on uneven  surfaces activity did not occur: Safety/medical concerns         Wheelchair     Assist Will patient use wheelchair at discharge?: Yes Type of Wheelchair: Manual    Wheelchair assist level: Minimal Assistance - Patient > 75% Max wheelchair distance: 150    Wheelchair 50 feet with 2 turns activity    Assist        Assist Level: Minimal Assistance - Patient > 75%   Wheelchair 150 feet activity     Assist      Assist Level: Minimal Assistance - Patient > 75%   Blood pressure (!) 147/75, pulse 66, temperature 98.5 F (36.9 C), temperature source Oral, resp. rate 18, height 6\' 1"  (1.854 m), weight 65 kg, SpO2 99 %.    Medical Problem List and Plan: 1.  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  Plan 30-day cardiac event monitor as outpatient tent d/c 9/23-As per nephrology note.  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  This is unlikely to improve at this point 2.  Antithrombotics: -DVT/anticoagulation:  plt on low side with bruising off  enoxaparin             -antiplatelet therapy: Aspirin 325 mg, Plavix 75 mg daily x3 weeks then aspirin alone, as discussed with neuro will discontinue Plavix and continue ASA 81 mg/day 3. Pain Management: Tylenol as needed 4. Mood: Provide emotional support             -antipsychotic agents: N/A 5. Neuropsych: This patient is capable of making decisions on his own behalf. 6. Skin/Wound Care: Routine skin checks 7. Fluids/Electrolytes/Nutrition: Routine in and outs with follow-up chemistries 8.  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  Monitor for any signs of fluid overload 9.  Hypertension. Permissive, but starting to tighten control   Vitals:   01/29/19 1940 01/30/19 0449  BP: (!) 158/77 (!) 147/75  Pulse: 76 66  Resp: 18 18  Temp: 97.7  F (36.5 C) 98.5 F (36.9 C)  SpO2: 100% 99%  on  amlodipine 5mg  improving will hold off on another increase for now  10.  ANCA necrotizing concentric glomerulonephritis.  off  Cytoxan , on  chronic prednisone.  Follow-up per renal services.  Admission creatinine of 2.95  -May be switching to Imuran 11.  Pancytopenia/chronic anemia.  Follow-up labs patient had been receiving Aranesp as an outpatient with renal services.  Another unit PRBCs yesterday , ( a total of 3U in last week) H and H if no change will need to transfuse and consult GI 12.  Hyperlipidemia.  Lipitor 13.  History of alcohol use.  Counseling and monitor. 14. Concern for spasticity formation- will con't to monitor- since has elevated DTRs on LLE, is at risk for spasticity development. 15. Diarrhea/Bowel issues/Nausea  9/6- will give Zofran prn 4 mg q8 hours prn and monitor 16. Dispo- home with wife and  24/7 sup 17.  Flexor spasms LLE add tizanidine qhs- reassured pt that sei  18.  Seizure left-sided post stroke.   Todd's paralysis left upper extremityresolved .  CT of the head showed expected evolution . Per Neuro rec changed seizure prophyllaxis to Valproate 750mg  BID with Keppra 500mg  BID.  Had a 1000mg  IV valproate load    Negative  EEG.-As discussed with neurology will get a stat level for valproate and if on the low side may need to increase valproic acid dose.  The patient is also not having generalized seizures but short duration partial seizures which should be relatively well-tolerated.  If the valproic acid level is on the high side of the normal range, may need to add Vimpat, discussed with Dr. Leonie Man Has had only partial seizures without resultant Todd's paralysis- VPA level low increase to 1000mg  BID, give IV VPA bolus of 500mg  , repeat VPA low but pt seizure free, will repeat next week dose has been increased   19.  Anemia- initially thought to be related to renal failure as well as pancytopenis, but also has fecal  oocult + stool, ask GI to eval for other source of blood loss, given need for freq transfusions in last week   lOS: 15 days A FACE TO FACE EVALUATION WAS PERFORMED  Charlett Blake 01/30/2019, 7:16 AM

## 2019-01-30 NOTE — Progress Notes (Signed)
Occupational Therapy Weekly Progress Note  Patient Details  Name: Jeremy Grant MRN: [id number] Date of Birth: November 22, 1960  Beginning of progress report period: January 23, 2019 End of progress report period: January 30, 2019  Today's Date: 01/30/2019 OT Individual Time: 4098-1191 OT Individual Time Calculation (min): 49 min    Mr. Hassey has made slow progress this week based on medical issues.  He continues to be limited during most sessions from his occurrence of seizures earlier in the week to his decreased HGB the past few days.  Therapies have been limited at times to bedside as well.  Overall he continues to demonstrate increased left sided weakness in the LE more than the UE.  Min assist is needed for sit to stand as well as stand pivot transfers with use of the RW for support.  He continues to demonstrate decreased dynamic trunk control with LOB to the left at times when attempting to donn his LB clothing sitting unsupported.  He exhibits decreased overall endurance for selfcare tasks as well.  Feel he will benefit from continued OT at CIR level in order to increase independence with selfcare tasks for return home.  All goals have currently been downgraded to supervision level as I feel based on medical setbacks and slower than expected progress that he will not reach modified independence during hospitalization.    Patient continues to demonstrate the following deficits: muscle weakness, impaired timing and sequencing, unbalanced muscle activation and decreased coordination, decreased attention to left and decreased sitting balance, decreased standing balance, decreased postural control and decreased balance strategies and therefore will continue to benefit from skilled OT intervention to enhance overall performance with BADL, iADL and Reduce care partner burden.  Patient progressing toward long term goals..  Continue plan of care.  OT Short Term Goals Week 3:  OT Short Term Goal  1 (Week 3): Continue working on established supervision level LTGs.  Skilled Therapeutic Interventions/Progress Updates:    Pt completed supine to sit with supervision and then worked on donning his shoes and left AFO.  Min assist to donn the left shoe and AFO secondary to decreased ability to get his heel all the way down in the shoe and for strapping the velcro strap around the leg.  He also exhibited LOB to the left X 1 when reaching down to the floor.  He transferred to the wheelchair squat pivot, but with increased trunk and hip extension.  Therapist rolled him down to the therapy gym where he completed stand pivot transfer to the therapy mat with min assist using the RW for support.  Worked on activation of the left trunk for reciprical scooting from seated position.  He demonstrates decreased ability to actively hike the left hip and rotate it forward or backwards.  He was able to complete scooting with facilitation at mod assist.  Transitioned to working in quadriped for LUE weightbearing.  Min facilitation needed to maintain left elbow extension while reaching for targets or sliding the right hand forward on the mat.  Finished session with return to the room and transfer back to the bed with min assist.  Pt left in the bed with call button and phone in reach and bed alarm in place.    Therapy Documentation Precautions:  Precautions Precautions: Fall Precaution Comments: LLE paresis more severe than in the LUE Restrictions Weight Bearing Restrictions: No  Pain: Pain Assessment Pain Scale: Faces Pain Score: 0-No pain Faces Pain Scale: Hurts a little bit Pain Type: Acute  pain Pain Location: Leg Pain Orientation: Left Pain Descriptors / Indicators: Aching;Spasm Pain Intervention(s): Medication (See eMAR) PAINAD (Pain Assessment in Advanced Dementia) Breathing: normal Negative Vocalization: none Facial Expression: smiling or inexpressive Body Language: relaxed Consolability: no need  to console PAINAD Score: 0 ADL: See Care Tool Section for some details of ADLs and mobility  Therapy/Group: Individual Therapy  Charis Juliana OTR/L 01/30/2019, 4:41 PM

## 2019-01-30 NOTE — Progress Notes (Signed)
Occupational Therapy Session Note  Patient Details  Name: Jeremy Grant MRN: [id number] Date of Birth: Jul 07, 1960  Today's Date: 01/30/2019 OT Individual Time: 1106-1200 OT Individual Time Calculation (min): 54 min    Short Term Goals: Week 2:  OT Short Term Goal 1 (Week 2): Continue working on established supervision level LTGs.  Skilled Therapeutic Interventions/Progress Updates:    Pt worked on shower, grooming, and dressing tasks during session.  Supervision for supine to sit EOB with min assist for sit to stand and functional mobility to the shower with use of the RW.  Decreased control in the LLE noted with decreased efficiency with advancing it secondary to weakness.  Pt at times getting of sequence and stepping forward with the non-involved RLE first instead of the LLE.  He needed min assist for removal of clothing sit to stand as well as min assist for LB bathing in sitting with lateral leans secondary to LOB to the left.  He completed transfer out to the wheelchair at the same min assist level and then worked on dressing sit to stand at the sink.  Min assist for LB dressing to thread his shorts over the LLE as well as for standing to pull them up over the hips.  Supervision for donning pullover shirt.  He completed oral hygiene and shaving in sitting with setup assist only.  Finished session with transfer back to the bed stand pivot without assistive device with mod assist.  Pt left with spouse present in the room and call button and phone in reach.    Therapy Documentation Precautions:  Precautions Precautions: Fall Precaution Comments: LLE paresis more severe than in the LUE Restrictions Weight Bearing Restrictions: No  Pain: Pain Assessment Pain Scale: Faces Pain Score: 0-No pain ADL: See Care Tool Section for some details of ADLs  Therapy/Group: Individual Therapy  Jaquasia Doscher OTR/L  01/30/2019, 12:28 PM

## 2019-01-30 NOTE — Progress Notes (Signed)
Physical Therapy Weekly Progress Note  Patient Details  Name: Jeremy Grant MRN: [id number] Date of Birth: 1960/12/08  Beginning of progress report period: January 23, 2019 End of progress report period: January 30, 2019  Today's Date: 01/30/2019 PT Individual Time: 0907-1004 PT Individual Time Calculation (min): 57 min   Patient has met 0 of 1 short term goals. Patient's ELOS was extended due to continued seizure activity and now unstable hemoglobin levels limiting pt's ability to participate in therapy sessions. Due to patient's medical complications his progression with therapy this week has been slow. Patient continues to demonstrate impaired endurance/activity tolerance, impaired L LE strength, impaired L LE motor control/coordination, impaired standing balance, and impaired trunk control resulting in increased assistance required to perform functional mobility tasks. Pt performs bed mobility with CGA/min assist, squat pivot transfers with CGA, sit<>stands using RW with close supervision, and ambulation using RW with min assist.  Patient continues to demonstrate the following deficits muscle weakness and muscle paralysis, decreased cardiorespiratoy endurance, impaired timing and sequencing, abnormal tone, unbalanced muscle activation, decreased coordination and decreased motor planning and decreased sitting balance, decreased standing balance, decreased postural control and decreased balance strategies and therefore will continue to benefit from skilled PT intervention to increase functional independence with mobility.  Patient progressing toward long term goals.  Continue plan of care.  PT Short Term Goals Week 2:  PT Short Term Goal 1 (Week 2): = to LTGs based on ELOS PT Short Term Goal 1 - Progress (Week 2): Progressing toward goal Week 3:  PT Short Term Goal 1 (Week 3): = to LTGs based on ELOS  Skilled Therapeutic Interventions/Progress Updates:  Ambulation/gait  training;Balance/vestibular training;Community reintegration;Discharge planning;Disease management/prevention;DME/adaptive equipment instruction;Functional electrical stimulation;Functional mobility training;Neuromuscular re-education;Pain management;Patient/family education;Psychosocial support;Skin care/wound management;Splinting/orthotics;Stair training;Therapeutic Activities;Therapeutic Exercise;UE/LE Strength taining/ROM;UE/LE Coordination activities;Wheelchair propulsion/positioning;Visual/perceptual remediation/compensation   Patient's Hgb is 7.5g/dL this AM. Pt received supine in bed and agreeable to therapy session; however, still with some fatigue from yesterday. Supine>sit, HOB flat but using bedrails, with increased time and CGA for trunk steadying. Therapist educated pt on squat pivot bed<>chair transfer technique for increased independence and safety at home. Squat pivot EOB>w/c with close supervision. Performed B UE and R LE w/c propulsion ~147f to therapy gym with increased time as pt continues to have to use L UE more frequently due to decreased strength to prevent himself from constantly veering L; this still occurred despite pt also using R LE to assist. Pt set-up for squat pivot transfer w/c>EOM with mod cuing for proper set-up and min assist for w/c management. R squat pivot w/c>EOM with CGA for steadying. Ambulated ~858fusing RW with L LE spry step AFO - pt demonstrating significant L knee flexion during stance phase with poor control and L toe drag during swing phase. Therapist placed heel lift in L shoe while wearing spry step AFO and pt ambulated ~[id number] continued to demonstrate significant gait deviations as described above. Transitioned back to previous L LE AFO and ambulated ~[id number] pt demonstrating more knee hyperextension during stance phase but no longer having significant knee flexion during stance. Therapist placed heel lift in shoe to assist with decreasing the amount of  knee hyperextension during stance phase and pt ambulated ~[id number] RW with CGA/min assist for balance and minimal to no improvement in knee positioning. Pt reporting significant fatigue and unable to perform further ambulatory tasks at this time. Performed repeated L LE step up/down on 6" step with B UE support on RW, mirror  feedback for improved posture, and max multimodal cuing for increased L hip/knee flexion and upright posture. Squat pivot EOM>w/c with CGA for steadying. Transported back to room in w/c due to significant fatigue. Squat pivot w/c>EOB with CGA for steadying. Sit>supine with min assist for L LE management. Pt left supine in bed with needs in reach and bed alarm on.   Therapy Documentation Precautions:  Precautions Precautions: Fall Precaution Comments: LLE paresis more severe than in the LUE Restrictions Weight Bearing Restrictions: No  Pain: Denies pain throughout session.  Therapy/Group: Individual Therapy  Tawana Scale, PT, DPT 01/30/2019, 7:53 AM

## 2019-01-30 NOTE — Consult Note (Signed)
[facility] CONSULT Reason for consult:blood in stool, anemia Referring Physician: Dr Letta Pate.  Jeremy Grant is an 58 y.o. male.  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  He has been on steroids Cytoxan and was on dialysis at 1 point but has improved and dialysis was stopped about 2 months ago.  His creatinine chronically runs of around 3.0.  She had a recent bout of shingles.  He has had shingles and has had a recent CVA and seizures and they will also Keppra and Depakote as well in the rehab unit.  He has had chronic problems with anemia and has required multiple transfusions and has had Aranesp given to him by the renal service in the past to keep his hemoglobin up.  He is currently in rehab and has been found to have a drop in hemoglobin.  Hemoglobin has been around 8 and a drop to 5.8 and stools were guaiac positive.  He is not currently on any acid reducing medications.  We were asked to see him because of his positive stools and drop in hemoglobin he remains on aspirin and prednisone his clopidogrel is currently on hold. Patient has never had EGD or colonoscopy.  He denies any symptoms of abdominal pain or heartburn.  He seen no blood when he wipes.  He feels that he is continuing to improve following his recent CVA.  Past Medical History:  Diagnosis Date  . Arthritis   . CHF (congestive heart failure) ([place])   . Combined systolic and diastolic cardiac dysfunction    Echo 11/03/2013 EF 78%, grade 3 diastolic dysfunction  . Hypertension   . NICM (nonischemic cardiomyopathy) ([place])    a. L/RHC (11/05/13): RA: 3, RV 52/5, PA 49/19 (31), PCWP 10, AO 166/93, PA 67%, Fick CO/CI: 5.71/2.97, Lmain: normal, LAD: large, without signficant dz, first diagonal has 20% dz at ostium, LCx: normal, RCA: 30% stenosis at the bifurcation of PDA and PLOM    Past Surgical History:  Procedure Laterality Date  . IR FLUORO GUIDE  CV LINE RIGHT  07/25/2018  . IR US GUIDE VASC ACCESS RIGHT  07/25/2018  . LEFT AND RIGHT HEART CATHETERIZATION WITH CORONARY ANGIOGRAM N/A 11/05/2013   Procedure: LEFT AND RIGHT HEART CATHETERIZATION WITH CORONARY ANGIOGRAM;  Surgeon: Peter M Martinique, MD;  Location: [facility] CATH LAB;  Service: Cardiovascular;  Laterality: N/A;  . RIGHT HEART CATHETERIZATION N/A 01/14/2014   Procedure: RIGHT HEART CATH;  Surgeon: Larey Dresser, MD;  Location: [facility] CATH LAB;  Service: Cardiovascular;  Laterality: N/A;    Family History  Problem Relation Age of Onset  . Heart attack Father   . Heart disease Father   . Arrhythmia Father   . Hypertension Brother   . Hypertension Brother     Social History:  reports that he has never smoked. He has never used smokeless tobacco. He reports current alcohol use of about 3.0 standard drinks of alcohol per week. He reports that he does not use drugs.  Allergies:  Allergies  Allergen Reactions  . Hydralazine Hcl     Medications; Prior to Admission medications   Medication Sig Start Date End Date Taking? Authorizing Provider  aspirin 325 MG tablet Take 1 tablet (325 mg total) by mouth daily. 01/16/19   Madalyn Rob, MD  atorvastatin (LIPITOR) 80 MG tablet Take 1 tablet (80 mg total) by mouth daily at 6 PM. 01/15/19 02/14/19  Madalyn Rob, MD  clopidogrel (PLAVIX) 75 MG tablet Take 1 tablet (75 mg total) by mouth daily for 18 days. 01/16/19 02/03/19  Madalyn Rob, MD  predniSONE (DELTASONE) 5 MG tablet Take 1 tablet (5 mg total) by mouth daily with breakfast. 01/16/19 02/15/19  Madalyn Rob, MD   . amLODipine  5 mg Oral Daily  . aspirin EC  81 mg Oral Daily  . atorvastatin  80 mg Oral q1800  . darbepoetin (ARANESP) injection - NON-DIALYSIS  100 mcg Subcutaneous Q Tue-1800  . divalproex  1,000 mg Oral Q12H  . levETIRAcetam  500 mg Oral BID  . predniSONE  5 mg Oral Q breakfast  . tiZANidine  2 mg Oral QHS   PRN Meds sodium chloride, acetaminophen, LORazepam, ondansetron,  sorbitol Results for orders placed or performed during the hospital encounter of 01/15/19 (from the past 48 hour(s))  Glucose, capillary     Status: Abnormal   Collection Time: 01/28/19  9:18 PM  Result Value Ref Range   Glucose-Capillary 153 (H) 70 - 99 mg/dL  CBC     Status: Abnormal   Collection Time: 01/29/19  6:36 AM  Result Value Ref Range   WBC 3.8 (L) 4.0 - 10.5 K/uL   RBC 1.70 (L) 4.22 - 5.81 MIL/uL   Hemoglobin 5.8 (LL) 13.0 - 17.0 g/dL    Comment: REPEATED TO VERIFY THIS CRITICAL RESULT HAS VERIFIED AND BEEN CALLED TO SUE HARDY RN BY SHANNON FLEMING ON 09 17 2020 AT 0720, AND HAS BEEN READ BACK.     HCT 16.5 (L) 39.0 - 52.0 %   MCV 97.1 80.0 - 100.0 fL   MCH 34.1 (H) 26.0 - 34.0 pg   MCHC 35.2 30.0 - 36.0 g/dL   RDW 15.7 (H) 11.5 - 15.5 %   Platelets 106 (L) 150 - 400 K/uL    Comment: REPEATED TO VERIFY Immature Platelet Fraction may be clinically indicated, consider ordering this additional test [id number] CONSISTENT WITH PREVIOUS RESULT    nRBC 0.0 0.0 - 0.2 %    Comment: Performed at [facility], [place] [address], [place], [place] [address]  Comprehensive metabolic panel     Status: Abnormal   Collection Time: 01/29/19  6:36 AM  Result Value Ref Range   Sodium 139 135 - 145 mmol/L   Potassium 3.5 3.5 - 5.1 mmol/L   Chloride 107 98 - 111 mmol/L   CO2 22 22 - 32 mmol/L   Glucose, Bld 147 (H) 70 - 99 mg/dL   BUN 83 (H) 6 - 20 mg/dL   Creatinine, Ser 2.84 (H) 0.61 - 1.24 mg/dL   Calcium 8.4 (L) 8.9 - 10.3 mg/dL   Total Protein 5.1 (L) 6.5 - 8.1 g/dL   Albumin 3.1 (L) 3.5 - 5.0 g/dL   AST 18 15 - 41 U/L   ALT 27 0 - 44 U/L   Alkaline Phosphatase 34 (L) 38 - 126 U/L   Total Bilirubin 0.5 0.3 - 1.2 mg/dL   GFR calc non Af Amer 23 (L) >60 mL/min   GFR calc Af Amer 27 (L) >60 mL/min   Anion gap 10 5 - 15    Comment: Performed at [facility], [place] [address] [place], [place] [address]  Glucose, capillary     Status: Abnormal   Collection Time:  01/29/19  6:41 AM  Result Value Ref Range   Glucose-Capillary 122 (H) 70 - 99 mg/dL  Hemoglobin and hematocrit, blood     Status: Abnormal  Collection Time: 01/29/19  8:12 AM  Result Value Ref Range   Hemoglobin 6.7 (LL) 13.0 - 17.0 g/dL    Comment: REPEATED TO VERIFY CRITICAL VALUE NOTED.  VALUE IS CONSISTENT WITH PREVIOUSLY REPORTED AND CALLED VALUE.    HCT 18.0 (L) 39.0 - 52.0 %    Comment: Performed at [facility], [place] [address], [place], [place] [address]  Prepare RBC     Status: None   Collection Time: 01/29/19 12:09 PM  Result Value Ref Range   Order Confirmation      ORDER PROCESSED BY BLOOD BANK Performed at [facility], [place] [address], [place], [place] [address]   Differential     Status: Abnormal   Collection Time: 01/29/19 12:44 PM  Result Value Ref Range   Neutrophils Relative % 74 %   Neutro Abs 2.9 1.7 - 7.7 K/uL   Lymphocytes Relative 9 %   Lymphs Abs 0.3 (L) 0.7 - 4.0 K/uL   Monocytes Relative 12 %   Monocytes Absolute 0.5 0.1 - 1.0 K/uL   Eosinophils Relative 2 %   Eosinophils Absolute 0.1 0.0 - 0.5 K/uL   Basophils Relative 1 %   Basophils Absolute 0.0 0.0 - 0.1 K/uL   Immature Granulocytes 2 %   Abs Immature Granulocytes 0.09 (H) 0.00 - 0.07 K/uL    Comment: Performed at [facility] [address], [place], [place] [address]  CBC     Status: Abnormal   Collection Time: 01/29/19 12:44 PM  Result Value Ref Range   WBC 3.8 (L) 4.0 - 10.5 K/uL   RBC 1.68 (L) 4.22 - 5.81 MIL/uL   Hemoglobin 6.0 (LL) 13.0 - 17.0 g/dL    Comment: REPEATED TO VERIFY CRITICAL VALUE NOTED.  VALUE IS CONSISTENT WITH PREVIOUSLY REPORTED AND CALLED VALUE.    HCT 16.3 (L) 39.0 - 52.0 %   MCV 97.0 80.0 - 100.0 fL   MCH 35.7 (H) 26.0 - 34.0 pg   MCHC 36.8 (H) 30.0 - 36.0 g/dL   RDW 15.9 (H) 11.5 - 15.5 %   Platelets 105 (L) 150 - 400 K/uL    Comment: REPEATED TO VERIFY Immature Platelet Fraction may be clinically indicated, consider ordering this  additional test [id number] CONSISTENT WITH PREVIOUS RESULT    nRBC 0.0 0.0 - 0.2 %    Comment: Performed at [facility], [place] [address] [place], [place] [address]  Hemoglobin and hematocrit, blood     Status: Abnormal   Collection Time: 01/30/19  7:07 AM  Result Value Ref Range   Hemoglobin 7.5 (L) 13.0 - 17.0 g/dL    Comment: REPEATED TO VERIFY POST TRANSFUSION SPECIMEN    HCT 20.5 (L) 39.0 - 52.0 %    Comment: Performed at [place] [address], [place], [place] [address]    No results found.             Blood pressure (!) 147/75, pulse 66, temperature 98.5 F (36.9 C), temperature source Oral, resp. rate 18, height 6\' 1"  (1.854 m), weight 65 kg, SpO2 99 %.  Physical exam:   General--thin Pleasant white male ENT--nonicteric Neck--supple no lymphadenopathy Heart--regular rate and rhythm without murmurs gallops Lungs--clear Abdomen--soft and completely nontender Psych--alert and oriented mood and affect appropriate   Assessment: 1.  Guaiac positive stool and anemia.  No etiology obvious but patient will apparently need to be on antiplatelet agents for stroke prophylaxis.  I think we need to  go ahead and do an EGD to make certain that he does not have an active ulcer. 2.  Glomerulonephritis.  Patient is been on multiple medications including Cytoxan and prednisone.  His creatinine has improved in the office less than 3.0.  He did require dialysis for a while but this is been stopped. 3.  Recent CVA.  Patient remains on aspirin clopidogrel was on hold  Plan: 1.  We will go ahead and empirically start him on pantoprazole twice daily.  After EGD we may be able to do this change that to famotidine. 2.  We will plan on EGD tomorrow at 9:00 by Dr. Alessandra Bevels.  Have discussed with patient he is agreeable.  He may need colonoscopy at some point in the future   Nancy Fetter 01/30/2019, 10:33 AM   This note was created using voice recognition software  and minor errors may Have occurred unintentionally. Pager: [phone] If no answer or after hours call [phone]

## 2019-01-31 ENCOUNTER — Encounter (HOSPITAL_COMMUNITY): Payer: Self-pay | Admitting: Gastroenterology

## 2019-01-31 ENCOUNTER — Ambulatory Visit (HOSPITAL_COMMUNITY)
Admission: RE | Admit: 2019-01-31 | Payer: BC Managed Care – PPO | Source: Home / Self Care | Admitting: Gastroenterology

## 2019-01-31 ENCOUNTER — Encounter (HOSPITAL_COMMUNITY)
Admission: RE | Disposition: A | Discharge status: Home / Self Care | Payer: Self-pay | Source: Intra-hospital | Attending: Physical Medicine & Rehabilitation

## 2019-01-31 ENCOUNTER — Inpatient Hospital Stay (HOSPITAL_COMMUNITY): Payer: BC Managed Care – PPO

## 2019-01-31 ENCOUNTER — Inpatient Hospital Stay (HOSPITAL_COMMUNITY): Payer: BC Managed Care – PPO | Admitting: Anesthesiology

## 2019-01-31 DIAGNOSIS — T380X5A Adverse effect of glucocorticoids and synthetic analogues, initial encounter: Secondary | ICD-10-CM

## 2019-01-31 DIAGNOSIS — D696 Thrombocytopenia, unspecified: Secondary | ICD-10-CM

## 2019-01-31 DIAGNOSIS — R569 Unspecified convulsions: Secondary | ICD-10-CM

## 2019-01-31 DIAGNOSIS — I10 Essential (primary) hypertension: Secondary | ICD-10-CM

## 2019-01-31 DIAGNOSIS — R739 Hyperglycemia, unspecified: Secondary | ICD-10-CM

## 2019-01-31 DIAGNOSIS — I7782 Antineutrophilic cytoplasmic antibody (ANCA) vasculitis: Secondary | ICD-10-CM

## 2019-01-31 DIAGNOSIS — I776 Arteritis, unspecified: Secondary | ICD-10-CM

## 2019-01-31 DIAGNOSIS — D649 Anemia, unspecified: Secondary | ICD-10-CM

## 2019-01-31 DIAGNOSIS — G8110 Spastic hemiplegia affecting unspecified side: Secondary | ICD-10-CM

## 2019-01-31 HISTORY — PX: ESOPHAGOGASTRODUODENOSCOPY: SHX5428

## 2019-01-31 HISTORY — PX: BIOPSY: SHX5522

## 2019-01-31 LAB — IRON AND TIBC
Iron: 60 ug/dL (ref 45–182)
Saturation Ratios: 26 % (ref 17.9–39.5)
TIBC: 234 ug/dL — ABNORMAL LOW (ref 250–450)
UIBC: 174 ug/dL

## 2019-01-31 LAB — CBC
HCT: 21.7 % — ABNORMAL LOW (ref 39.0–52.0)
Hemoglobin: 8 g/dL — ABNORMAL LOW (ref 13.0–17.0)
MCH: 35.1 pg — ABNORMAL HIGH (ref 26.0–34.0)
MCHC: 36.9 g/dL — ABNORMAL HIGH (ref 30.0–36.0)
MCV: 95.2 fL (ref 80.0–100.0)
Platelets: 116 10*3/uL — ABNORMAL LOW (ref 150–400)
RBC: 2.28 MIL/uL — ABNORMAL LOW (ref 4.22–5.81)
RDW: 16.8 % — ABNORMAL HIGH (ref 11.5–15.5)
WBC: 5.4 10*3/uL (ref 4.0–10.5)
nRBC: 0 % (ref 0.0–0.2)

## 2019-01-31 LAB — FERRITIN: Ferritin: 945 ng/mL — ABNORMAL HIGH (ref 24–336)

## 2019-01-31 LAB — GLUCOSE, CAPILLARY
Glucose-Capillary: 154 mg/dL — ABNORMAL HIGH (ref 70–99)
Glucose-Capillary: 128 mg/dL — ABNORMAL HIGH (ref 70–99)

## 2019-01-31 SURGERY — EGD (ESOPHAGOGASTRODUODENOSCOPY)
Anesthesia: Monitor Anesthesia Care

## 2019-01-31 MED ORDER — PROPOFOL 500 MG/50ML IV EMUL
INTRAVENOUS | Status: DC | PRN
  Administered 2019-01-31: 100 ug/kg/min via INTRAVENOUS

## 2019-01-31 MED ORDER — DARBEPOETIN ALFA 200 MCG/0.4ML IJ SOSY
200.0000 ug | PREFILLED_SYRINGE | INTRAMUSCULAR | Status: DC
  Filled 2019-01-31: qty 0.4

## 2019-01-31 MED ORDER — DARBEPOETIN ALFA 200 MCG/0.4ML IJ SOSY
200.0000 ug | PREFILLED_SYRINGE | INTRAMUSCULAR | Status: DC
  Administered 2019-02-03: 200 ug via SUBCUTANEOUS
  Filled 2019-01-31: qty 0.4

## 2019-01-31 MED ORDER — INSULIN ASPART 100 UNIT/ML ~~LOC~~ SOLN
0.0000 [IU] | Freq: Three times a day (TID) | SUBCUTANEOUS | Status: DC
  Administered 2019-01-31: 18:00:00 2 [IU] via SUBCUTANEOUS

## 2019-01-31 MED ORDER — SODIUM CHLORIDE 0.9 % IV SOLN: INTRAVENOUS | Status: DC

## 2019-01-31 MED ORDER — AZATHIOPRINE 50 MG PO TABS
50.0000 mg | ORAL_TABLET | Freq: Every day | ORAL | Status: DC
  Administered 2019-01-31 – 2019-02-04 (×5): 50 mg via ORAL
  Filled 2019-01-31 (×5): qty 1

## 2019-01-31 MED ORDER — PROPOFOL 10 MG/ML IV BOLUS
INTRAVENOUS | Status: DC | PRN
  Administered 2019-01-31 (×2): 20 mg via INTRAVENOUS

## 2019-01-31 MED ORDER — PANTOPRAZOLE SODIUM 40 MG PO TBEC
40.0000 mg | DELAYED_RELEASE_TABLET | Freq: Every day | ORAL | Status: DC
  Administered 2019-01-31 – 2019-02-04 (×5): 40 mg via ORAL
  Filled 2019-01-31 (×4): qty 1

## 2019-01-31 MED ORDER — SODIUM CHLORIDE 0.9 % IV SOLN
INTRAVENOUS | Status: DC
  Administered 2019-01-31: 08:00:00 via INTRAVENOUS

## 2019-01-31 NOTE — Anesthesia Postprocedure Evaluation (Signed)
Anesthesia Post Note  Patient: Jeremy Grant  Procedure(s) Performed: ESOPHAGOGASTRODUODENOSCOPY (EGD) (N/A ) BIOPSY     Patient location during evaluation: Endoscopy Anesthesia Type: MAC Level of consciousness: awake and alert, oriented and patient cooperative Pain management: pain level controlled Vital Signs Assessment: post-procedure vital signs reviewed and stable Respiratory status: spontaneous breathing, nonlabored ventilation and respiratory function stable Cardiovascular status: blood pressure returned to baseline and stable Postop Assessment: no apparent nausea or vomiting Anesthetic complications: no    Last Vitals:  Vitals:   01/31/19 0900 01/31/19 0910  BP: (!) 156/80 (!) 168/72  Pulse: 82 68  Resp: (!) 24 (!) 37  Temp:    SpO2: 100% 100%    Last Pain:  Vitals:   01/31/19 0858  TempSrc:   PainSc: 0-No pain                 Ruben Mahler,E. Ayza Ripoll

## 2019-01-31 NOTE — Transfer of Care (Signed)
Immediate Anesthesia Transfer of Care Note  Patient: Jeremy Grant  Procedure(s) Performed: ESOPHAGOGASTRODUODENOSCOPY (EGD) (N/A ) BIOPSY  Patient Location: Endoscopy Unit  Anesthesia Type:MAC  Level of Consciousness: awake, alert  and oriented  Airway & Oxygen Therapy: Patient Spontanous Breathing  Post-op Assessment: Report given to RN and Post -op Vital signs reviewed and stable  Post vital signs: Reviewed and stable  Last Vitals:  Vitals Value Taken Time  BP 156/80 01/31/19 0858  Temp    Pulse 79 01/31/19 0858  Resp 23 01/31/19 0858  SpO2 100 % 01/31/19 0858  Vitals shown include unvalidated device data.  Last Pain:  Vitals:   01/31/19 0801  TempSrc: Temporal  PainSc: 0-No pain      Patients Stated Pain Goal: 0 (62/82/41 7530)  Complications: No apparent anesthesia complications

## 2019-01-31 NOTE — Progress Notes (Signed)
[facility]enterology Progress Note  Jeremy Grant 58 y.o. 07/05/1960  CC:  Anemia, OB +   Subjective: No acute issues overnight.   ROS : Denies chest pain and shortness of breath   Objective: Vital signs in last 24 hours: Vitals:   01/31/19 0503 01/31/19 0801  BP: (!) 151/77 (!) 159/78  Pulse: 64 72  Resp: 18 19  Temp: (!) 97.5 F (36.4 C) 98.9 F (37.2 C)  SpO2: 100% 99%    Physical Exam:  General:  Alert, cooperative, no distress, appears stated age  Head:  Normocephalic, without obvious abnormality, atraumatic  Eyes:  , EOM's intact,   Lungs:   Clear to auscultation bilaterally, respirations unlabored, anterior exam only  Heart:  Regular rate and rhythm, S1, S2 normal  Abdomen:   Soft, non-tender, bowel sounds active all four quadrants,  no masses,   Extremities: Extremities normal, atraumatic, no  edema  Pulses: 2+ and symmetric    Lab Results: Recent Labs    01/29/19 0636  NA 139  K 3.5  CL 107  CO2 22  GLUCOSE 147*  BUN 83*  CREATININE 2.84*  CALCIUM 8.4*   Recent Labs    01/29/19 0636  AST 18  ALT 27  ALKPHOS 34*  BILITOT 0.5  PROT 5.1*  ALBUMIN 3.1*   Recent Labs    01/29/19 0636  01/29/19 1244 01/30/19 0707  WBC 3.8*  --  3.8*  --   NEUTROABS  --   --  2.9  --   HGB 5.8*  5.7*   < > 6.0* 7.5*  HCT 16.5*   < > 16.3* 20.5*  MCV 97.1  --  97.0  --   PLT 106*  --  105*  --    < > = values in this interval not displayed.   No results for input(s): LABPROT, INR in the last 72 hours.    Assessment/Plan: -Anemia with occult blood positive stool. -History of stroke.  Currently on aspirin.  Plavix discontinued. 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   Otis Brace MD, [place] 01/31/2019, 8:26 AM  Contact #  [phone]

## 2019-01-31 NOTE — Progress Notes (Signed)
Patient returned back to room from Endo.; patient alert and oriented and denies pain, Notified Dr. Posey Pronto patient back in room and order to restart medicines after reviewing GI report. Will continue to monitor.

## 2019-01-31 NOTE — Progress Notes (Signed)
[facility] Progress Note    Assessment/ Plan:    1.  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  He was initially treated with pulse-dose steroids followed by taper and cyclophosphamide which he is still on He has achieved remission.  UA is unremarkable, renal function has recovered (still with some resulting CKD), and UP/C 1.5.  MPO ANCA titer is 18.5, initially > 100.  It is coming up to decide his maintenance therapy course (to continue for approximately 18-24 months).  Given history of severe AKI which required dialysis, observation without maintenance therapy is not appropriate.  He has a lesser risk of relapse as opposed to PR-3 ANCA vasculitis.  His options for maintenance are rituximab, Imuran (azathioprine), or Cellcept.  Rituximab has better long-term remission rates as opposed to AZA or Cellcept. There is a practical concern with Ritux right now- if pt had a seizure while receiving infusion, would be impossible to tell if an infusion reaction were taking place vs seizure and could preclude him from receiving therapy with rituximab in the future if he had a relapse.  So- I think I will choose to hold on ritux for now.  AZA is superior to cellcept.  No concerns with drug/ drug interactions other than additive effect with depakote and keppra.  Will d/c cytoxan, allow to wash out for 24-48 hrs, and then begin imuran- started today 9/19 at 50 mg daily.  G6PD normal, TPMT pending  2.  Anemia: s/p 2 u pRBCs, Aranesp dosed 9/15, has falling Hgb again, FOBT + on 9/12, agree with GI consult.  Will order another 1 u pRBCs. He has a mild pancytopenia d/t likely med effect (cytoxan), but if nothing is found with GI evaluation may need to consider hematological etiology of anemia.  Getting EGD tomorrow- esophageal ulcer and erosive gastropathy, on PPI now.  Needs EGD as OP and c-scope at the same time.  I'll order CBCB and iron panel,   Increase aranesp to 200 mcg    3.  Seizures: keppra and valproate  4.  S/p ACA stroke: on ASA, d/c'd Plavix.   5.  Dispo: in rehab    Subjective:    For EGD tomorrow.  Hgb up to 7.5.  Pt is nervous- discussed need to figure out where blood going.    Objective:   BP (!) 168/72   Pulse 68   Temp 98.9 F (37.2 C) (Temporal)   Resp (!) 37   Ht 6\' 1"  (1.854 m)   Wt 63.1 kg   SpO2 100%   BMI 18.35 kg/m   Intake/Output Summary (Last 24 hours) at 01/31/2019 1313 Last data filed at 01/31/2019 0900 Gross per 24 hour  Intake 340 ml  Output 1550 ml  Net -1210 ml   Weight change: -1.9 kg  Physical Exam: GEN NAD, sitting up in bed, NAD HEENT EOMI PERRL NECK no JVD PULM clear bilaterally no c/w/r CV RRR soft systolic murmur no r/g ABD soft, nontender, NABS EXT no LE edema NEURO AAO x 3 SKIN multiple ecchymoses but no rash, heparin shots with some bruising but no frank hematomas.  Imaging: No results found.  Labs: BMET Recent Labs  Lab 01/26/19 0745 01/29/19 0636  NA 141 139  K 3.9 3.5  CL 110 107  CO2 21* 22  GLUCOSE 190* 147*  BUN 67* 83*  CREATININE 2.85* 2.84*  CALCIUM 8.8* 8.4*   CBC Recent Labs  Lab 01/26/19 0745  01/27/19  3212 01/27/19 1647 01/29/19 0636 01/29/19 0812 01/29/19 1244 01/30/19 0707  WBC 4.2   < > 4.2 4.8 3.8*  --  3.8*  --   NEUTROABS 3.4  --   --   --   --   --  2.9  --   HGB 6.5*   < > 6.6* 7.9* 5.8*  5.7* 6.7* 6.0* 7.5*  HCT 18.6*   < > 18.4* 22.3* 16.5* 18.0* 16.3* 20.5*  MCV 101.6*   < > 96.8 94.9 97.1  --  97.0  --   PLT 129*   < > 117* 106* 106*  --  105*  --    < > = values in this interval not displayed.    Medications:    . amLODipine  5 mg Oral Daily  . aspirin EC  81 mg Oral Daily  . atorvastatin  80 mg Oral q1800  . azaTHIOprine  50 mg Oral Daily  . darbepoetin (ARANESP) injection - NON-DIALYSIS  200 mcg Subcutaneous Q Tue-1800  . divalproex  1,000 mg Oral Q12H  . levETIRAcetam  500 mg Oral BID  . pantoprazole   40 mg Oral Daily  . predniSONE  5 mg Oral Q breakfast  . tiZANidine  2 mg Oral QHS      Madelon Lips, MD 01/31/2019, 1:13 PM

## 2019-01-31 NOTE — Brief Op Note (Signed)
01/15/2019 - 01/31/2019  9:03 AM  PATIENT:  Jeremy Grant  58 y.o. male  PRE-OPERATIVE DIAGNOSIS:  blood in stool  POST-OPERATIVE DIAGNOSIS:  gastritis, no active bleeding. biopsy taken esoph, gastric, SB  PROCEDURE:  Procedure(s): ESOPHAGOGASTRODUODENOSCOPY (EGD) (N/A) BIOPSY  SURGEON:  Surgeon(s) and Role:    * Rube Sanchez, MD - Primary  Findings ------------ -EGD showed nonbleeding distal esophageal ulcer as well as erosive gastropathy.  No active bleeding.  Biopsies taken  Recommendations ----------------------- -Advance diet to soft -Start Protonix 40 mg once a day -Okay to resume antiplatelet from GI standpoint. -Recommend repeat EGD in 2 months to document healing of esophageal ulcer.  Also recommend colonoscopy at the time of repeat EGD -No further inpatient GI work-up planned. -Follow-up in GI clinic in 6 weeks. -GI will sign off.  Call us back if needed  Otis Brace MD, [place] 01/31/2019, 9:05 AM  Contact #  [phone]

## 2019-01-31 NOTE — Progress Notes (Signed)
Physical Therapy Session Note  Patient Details  Name: Jeremy Grant MRN: [id number] Date of Birth: [birth year]  Today's Date: 01/31/2019 PT Individual Time: 1303-1400 PT Individual Time Calculation (min): 57 min   Short Term Goals: Week 2:  PT Short Term Goal 1 (Week 2): = to LTGs based on EL[facility] PT Short Term Goal 1 - Progress (Week 2): Progressing toward goal  Skilled Therapeutic Interventions/Progress Updates:   Pt supine in bed upon PT arrival, agreeable to therapy tx and denies pain. Pt reports feeling better today, not as fatigue. Pt transferred to sitting with supervision and transferred to w/c min assist stand pivot. Pt transported to the gym. Pt worked on gait training this session with trials using different AFOs. Trial 1- PLS AFO with heel wedge, min assist x 60 ft, knee flexion noted during stance with facilitation to increased knee extension/block hyperrextension. Trial 2- GRAFO with RW and min assist x 40 ft, therapist noted ankle eversion, knee varus and increased hip IR today compared to previous session.  Trial 3- using L ace wrap for DF assist and RW x 60 ft with min assist, therapist facilitating L knee positioning during stance. Pt noted to have tight hamstrings and heel cord L LE compared to R LE, potentially impacting knee positioning/control in stance. Therapist performed hamstring stretch to L LE 2 x 66min, educated on importance of stretch. Pt worked on L gastroc stretching this session while standing on red wedge without UE support, while performing horseshoe toss task x 2 trials with CGA-min assist. Pt worked on pre-gait and L LE stance control while performing 2 x 10 lateral steps in place with R LE without AD, therapist blocking L knee to limit flexion/hyperextension, therapist also facilitating pelvic rotation anteriorly at L hip and posteriorly at R hip, as pt rotates pelvis on L LE maintaining hip IR noted in standing. Pt transferred back to w/c with min assist and  transported back to room, left in w/c with needs in reach and chair alarm set.     Therapy Documentation Precautions:  Precautions Precautions: Fall Precaution Comments: LLE paresis more severe than in the LUE Restrictions Weight Bearing Restrictions: No    Therapy/Group: Individual Therapy  Netta Corrigan, PT, DPT 01/31/2019, 1:04 PM

## 2019-01-31 NOTE — Progress Notes (Signed)
[place] PHYSICAL MEDICINE & REHABILITATION PROGRESS NOTE   Subjective/Complaints: Patient seen sitting up in his chair this afternoon.  He states he slept well overnight.  Patient seen after returning from EGD this morning.  He denies complaints.  ROS: Denies CP, SOB, N/V/D  Objective:   No results found. Recent Labs    01/29/19 0636  01/29/19 1244 01/30/19 0707  WBC 3.8*  --  3.8*  --   HGB 5.8*  5.7*   < > 6.0* 7.5*  HCT 16.5*   < > 16.3* 20.5*  PLT 106*  --  105*  --    < > = values in this interval not displayed.   Recent Labs    01/29/19 0636  NA 139  K 3.5  CL 107  CO2 22  GLUCOSE 147*  BUN 83*  CREATININE 2.84*  CALCIUM 8.4*    Intake/Output Summary (Last 24 hours) at 01/31/2019 1506 Last data filed at 01/31/2019 1300 Gross per 24 hour  Intake 700 ml  Output 1050 ml  Net -350 ml     Physical Exam: Vital Signs Blood pressure (!) 142/72, pulse 95, temperature 97.9 F (36.6 C), temperature source Oral, resp. rate 17, height 6\' 1"  (1.854 m), weight 63.1 kg, SpO2 99 %. Constitutional: No distress . Vital signs reviewed. HENT: Normocephalic.  Atraumatic. Eyes: EOMI. No discharge. Cardiovascular: No JVD. Respiratory: Normal effort.  No stridor. GI: Non-distended. Skin: Scattered ecchymosis Psych: Normal mood.  Normal behavior. Musc: No edema in extremities.  No tenderness in extremities. Motor: Alert RUE: 5/5 proximal distal RLE: 3+/5 proximal and distal LUE: 4-/5 proximal distal LLE: 3-/5 hip flexion, knee extension, 0/5 ankle dorsiflexion  Assessment/Plan: 1. 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  Physiatrist is providing close team supervision and 24 hour management of active medical problems listed below.  Physiatrist and rehab team continue to assess barriers to discharge/monitor patient progress toward functional and medical  goals  Care Tool:  Bathing  Bathing activity did not occur: Refused Body parts bathed by patient: Right arm, Left arm, Chest, Abdomen, Right upper leg, Left upper leg, Right lower leg, Left lower leg, Face, Front perineal area, Buttocks   Body parts bathed by helper: Front perineal area, Buttocks     Bathing assist Assist Level: Minimal Assistance - Patient > 75%     Upper Body Dressing/Undressing Upper body dressing   What is the patient wearing?: Pull over shirt    Upper body assist Assist Level: Set up assist    Lower Body Dressing/Undressing Lower body dressing      What is the patient wearing?: Pants     Lower body assist Assist for lower body dressing: Minimal Assistance - Patient > 75%     Toileting Toileting    Toileting assist Assist for toileting: Independent with assistive device Assistive Device Comment: urinal   Transfers Chair/bed transfer  Transfers assist     Chair/bed transfer assist level: Contact Guard/Touching assist     Locomotion Ambulation   Ambulation assist      Assist level: Minimal Assistance - Patient > 75% Assistive device: Walker-rolling Max distance: 63ft   Walk 10 feet activity   Assist     Assist level: Minimal Assistance - Patient > 75% Assistive device: Walker-rolling   Walk 50 feet activity   Assist    Assist level: Minimal Assistance - Patient > 75% Assistive device: Walker-rolling  Walk 150 feet activity   Assist Walk 150 feet activity did not occur: Safety/medical concerns  Assist level: Minimal Assistance - Patient > 75% Assistive device: Walker-rolling    Walk 10 feet on uneven surface  activity   Assist Walk 10 feet on uneven surfaces activity did not occur: Safety/medical concerns         Wheelchair     Assist Will patient use wheelchair at discharge?: Yes Type of Wheelchair: Manual    Wheelchair assist level: Set up assist, Supervision/Verbal cueing Max wheelchair  distance: 182ft    Wheelchair 50 feet with 2 turns activity    Assist        Assist Level: Set up assist, Supervision/Verbal cueing   Wheelchair 150 feet activity     Assist      Assist Level: Supervision/Verbal cueing, Set up assist   Blood pressure (!) 142/72, pulse 95, temperature 97.9 F (36.6 C), temperature source Oral, resp. rate 17, height 6\' 1"  (1.854 m), weight 63.1 kg, SpO2 99 %.    Medical Problem List and Plan: 1.  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  Plan 30-day cardiac event monitor as outpatient tent d/c 9/23-As per nephrology note.  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  This is unlikely to improve at this point  Continue CIR 2.  Antithrombotics: -DVT/anticoagulation:  plt on low side with bruising off  enoxaparin             -antiplatelet therapy: After discussion with neuro, Plavix DC'd, continue ASA 81  3. Pain Management: Tylenol as needed 4. Mood: Provide emotional support             -antipsychotic agents: N/A 5. Neuropsych: This patient is capable of making decisions on his own behalf. 6. Skin/Wound Care: Routine skin checks 7. Fluids/Electrolytes/Nutrition: Routine I/Os 8.  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  Monitor for any signs of fluid overload Filed Weights   01/29/19 0433 01/30/19 0531 01/31/19 0500  Weight: 63 kg 65 kg 63.1 kg   9.  Hypertension.    Vitals:   01/31/19 0910 01/31/19 1418  BP: (!) 168/72 (!) 142/72  Pulse: 68 95  Resp: (!) 37 17  Temp:  97.9 F (36.6 C)  SpO2: 100% 99%   Amlodipine 5mg   Time is modified on 9/19 due to a.m. procedure, remains elevated, will continue to monitor today 10.  ANCA necrotizing concentric glomerulonephritis.  off  Cytoxan , on  chronic prednisone.  Follow-up per renal services.   Admission creatinine of 2.95  -Plan to change to Imuran per nephro  Creatinine 2.84 on 9/17, labs ordered for Monday 11.  Pancytopenia/chronic anemia.  Follow-up labs patient had been receiving Aranesp as an outpatient with renal services.  Has had several transfusions  Hemoglobin 7.5 on 9/18  Continue to monitor  12.  Hyperlipidemia.  Lipitor 13.  History of alcohol use.  Counseling and monitor. 14.  Spasticity  Tizanidine nightly 15. Diarrhea/Bowel issues/Nausea  Zofran prn 4 mg q8 hours prn and monitor  Improving 17.  Seizure left-sided post stroke.    Per Neuro rec changed seizure prophyllaxis to Valproate 750mg  BID with Keppra 500mg  BID.  Had a 1000mg  IV valproate load    Negative  EEG.-As discussed with neurology will get a stat level for valproate and if on the low side may need to increase  valproic acid dose.  The patient is also not having generalized seizures but short duration partial seizures which should be relatively well-tolerated.  If the valproic acid level is on the high side of the normal range, may need to add Vimpat  VPA level low increase to 1000mg  BID, given IV VPA bolus of 500mg  , repeat VPA low but pt seizure free  Plan to repeat level next week dose has been increased  18.  Anemia- initially thought to be related to renal failure as well as pancytopenis, but also has fecal oocult + stool,   EGD performed on 9/19, showing nonbleeding distal esophageal ulcer with erosive gastropathy, recommend repeat EGD in 2 months along with colonoscopy  PPI 40 mg daily  See #11 19.  Steroid-induced hyperglycemia  CBGs ordered  SSI 20.  Thrombocytopenia  Platelets 105 on 9/17, labs ordered for Monday  LOS: 16 days A FACE TO FACE EVALUATION WAS PERFORMED  Kourtlyn Charlet Lorie Phenix 01/31/2019, 3:06 PM

## 2019-02-01 ENCOUNTER — Inpatient Hospital Stay (HOSPITAL_COMMUNITY): Payer: BC Managed Care – PPO

## 2019-02-01 DIAGNOSIS — D62 Acute posthemorrhagic anemia: Secondary | ICD-10-CM

## 2019-02-01 LAB — CBC
HCT: 19.5 % — ABNORMAL LOW (ref 39.0–52.0)
Hemoglobin: 7 g/dL — ABNORMAL LOW (ref 13.0–17.0)
MCH: 34.5 pg — ABNORMAL HIGH (ref 26.0–34.0)
MCHC: 35.9 g/dL (ref 30.0–36.0)
MCV: 96.1 fL (ref 80.0–100.0)
Platelets: 107 10*3/uL — ABNORMAL LOW (ref 150–400)
RBC: 2.03 MIL/uL — ABNORMAL LOW (ref 4.22–5.81)
RDW: 16.7 % — ABNORMAL HIGH (ref 11.5–15.5)
WBC: 4.4 10*3/uL (ref 4.0–10.5)
nRBC: 0 % (ref 0.0–0.2)

## 2019-02-01 LAB — RENAL FUNCTION PANEL
Albumin: 3.2 g/dL — ABNORMAL LOW (ref 3.5–5.0)
Anion gap: 7 (ref 5–15)
BUN: 42 mg/dL — ABNORMAL HIGH (ref 6–20)
CO2: 24 mmol/L (ref 22–32)
Calcium: 8.5 mg/dL — ABNORMAL LOW (ref 8.9–10.3)
Chloride: 109 mmol/L (ref 98–111)
Creatinine, Ser: 2.61 mg/dL — ABNORMAL HIGH (ref 0.61–1.24)
GFR calc Af Amer: 30 mL/min — ABNORMAL LOW (ref >60)
GFR calc non Af Amer: 26 mL/min — ABNORMAL LOW (ref >60)
Glucose, Bld: 113 mg/dL — ABNORMAL HIGH (ref 70–99)
Phosphorus: 3.1 mg/dL (ref 2.5–4.6)
Potassium: 3.6 mmol/L (ref 3.5–5.1)
Sodium: 140 mmol/L (ref 135–145)

## 2019-02-01 LAB — GLUCOSE, CAPILLARY
Glucose-Capillary: 105 mg/dL — ABNORMAL HIGH (ref 70–99)
Glucose-Capillary: 128 mg/dL — ABNORMAL HIGH (ref 70–99)
Glucose-Capillary: 157 mg/dL — ABNORMAL HIGH (ref 70–99)
Glucose-Capillary: 143 mg/dL — ABNORMAL HIGH (ref 70–99)

## 2019-02-01 LAB — OCCULT BLOOD X 1 CARD TO LAB, STOOL: Fecal Occult Bld: POSITIVE — AB

## 2019-02-01 MED ORDER — AMLODIPINE BESYLATE 10 MG PO TABS
10.0000 mg | ORAL_TABLET | Freq: Every day | ORAL | Status: DC
  Administered 2019-02-02 – 2019-02-04 (×3): 10 mg via ORAL
  Filled 2019-02-01 (×3): qty 1

## 2019-02-01 MED ORDER — AMLODIPINE BESYLATE 5 MG PO TABS
5.0000 mg | ORAL_TABLET | Freq: Once | ORAL | Status: AC
  Administered 2019-02-01: 14:00:00 5 mg via ORAL
  Filled 2019-02-01: qty 1

## 2019-02-01 NOTE — Progress Notes (Signed)
[facility] PHYSICAL MEDICINE & REHABILITATION PROGRESS NOTE   Subjective/Complaints: Patient seen sitting up in his chair working with therapy this morning.  He states he slept fairly overnight.  Wife present with several questions regarding etiology of ulcers, CBGs, medications and their side effects.  She is very wary of medications and further procedures due to side effects and cost respectively.  ROS: Denies CP, SOB, N/V/D  Objective:   No results found. Recent Labs    01/31/19 1445 02/01/19 0546  WBC 5.4 4.4  HGB 8.0* 7.0*  HCT 21.7* 19.5*  PLT 116* 107*   Recent Labs    02/01/19 0546  NA 140  K 3.6  CL 109  CO2 24  GLUCOSE 113*  BUN 42*  CREATININE 2.61*  CALCIUM 8.5*    Intake/Output Summary (Last 24 hours) at 02/01/2019 1233 Last data filed at 02/01/2019 0723 Gross per 24 hour  Intake 680 ml  Output 1600 ml  Net -920 ml     Physical Exam: Vital Signs Blood pressure (!) 159/93, pulse 69, temperature 97.9 F (36.6 C), temperature source Oral, resp. rate 16, height 6\' 1"  (1.854 m), weight 63.5 kg, SpO2 100 %. Constitutional: No distress . Vital signs reviewed. HENT: Normocephalic.  Atraumatic. Eyes: EOMI. No discharge. Cardiovascular: No JVD. Respiratory: Normal effort.  No stridor. GI: Non-distended. Skin: Scattered ecchymosis Psych: Somewhat flat Musc: No edema in extremities.  No tenderness in extremities. Motor: Alert RLE: 3+/5 proximal and distal, unchanged LUE: 4-/5 proximal distal LLE: 3-/5 hip flexion, knee extension, 0/5 ankle dorsiflexion, unchanged  Assessment/Plan: 1. 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  Physiatrist is providing close team supervision and 24 hour management of active medical problems listed below.  Physiatrist and rehab team continue to assess barriers to discharge/monitor patient progress toward  functional and medical goals  Care Tool:  Bathing  Bathing activity did not occur: Refused Body parts bathed by patient: Right arm, Left arm, Chest, Abdomen, Right upper leg, Left upper leg, Right lower leg, Left lower leg, Face, Front perineal area, Buttocks   Body parts bathed by helper: Front perineal area, Buttocks     Bathing assist Assist Level: Minimal Assistance - Patient > 75%     Upper Body Dressing/Undressing Upper body dressing   What is the patient wearing?: Pull over shirt    Upper body assist Assist Level: Set up assist    Lower Body Dressing/Undressing Lower body dressing      What is the patient wearing?: Pants     Lower body assist Assist for lower body dressing: Minimal Assistance - Patient > 75%     Toileting Toileting    Toileting assist Assist for toileting: Independent with assistive device Assistive Device Comment: urinal   Transfers Chair/bed transfer  Transfers assist     Chair/bed transfer assist level: Contact Guard/Touching assist     Locomotion Ambulation   Ambulation assist      Assist level: Minimal Assistance - Patient > 75% Assistive device: Walker-rolling Max distance: 50 ft   Walk 10 feet activity   Assist     Assist level: Minimal Assistance - Patient > 75% Assistive device: Walker-rolling   Walk 50 feet activity   Assist    Assist level: Minimal Assistance - Patient > 75% Assistive device: Walker-rolling    Walk 150 feet activity   Assist Walk 150 feet activity did not occur: Safety/medical concerns  Assist level: Minimal Assistance - Patient > 75% Assistive device: Walker-rolling    Walk 10 feet on uneven surface  activity   Assist Walk 10 feet on uneven surfaces activity did not occur: Safety/medical concerns         Wheelchair     Assist Will patient use wheelchair at discharge?: Yes Type of Wheelchair: Manual    Wheelchair assist level: Set up assist, Supervision/Verbal  cueing Max wheelchair distance: 143ft    Wheelchair 50 feet with 2 turns activity    Assist        Assist Level: Set up assist, Supervision/Verbal cueing   Wheelchair 150 feet activity     Assist      Assist Level: Supervision/Verbal cueing, Set up assist   Blood pressure (!) 159/93, pulse 69, temperature 97.9 F (36.6 C), temperature source Oral, resp. rate 16, height 6\' 1"  (1.854 m), weight 63.5 kg, SpO2 100 %.    Medical Problem List and Plan: 1.  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  Plan 30-day cardiac event monitor as outpatient tent d/c 9/23-As per nephrology note.  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  This is unlikely to improve at this point  Continue CIR 2.  Antithrombotics: -DVT/anticoagulation:  plt on low side with bruising off  enoxaparin             -antiplatelet therapy: After discussion with neuro, Plavix DC'd, continue ASA 81  3. Pain Management: Tylenol as needed 4. Mood: Provide emotional support             -antipsychotic agents: N/A 5. Neuropsych: This patient is capable of making decisions on his own behalf. 6. Skin/Wound Care: Routine skin checks 7. Fluids/Electrolytes/Nutrition: Routine I/Os 8.  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  Monitor for any signs of fluid overload Filed Weights   01/30/19 0531 01/31/19 0500 02/01/19 0446  Weight: 65 kg 63.1 kg 63.5 kg   Stable on 9/20 9.  Hypertension.    Vitals:   01/31/19 1955 02/01/19 0406  BP: (!) 166/82 (!) 159/93  Pulse: 73 69  Resp: 16 16  Temp: 97.8 F (36.6 C) 97.9 F (36.6 C)  SpO2: 98% 100%   Amlodipine 5mg , increased to 10 on 9/20 10.  ANCA necrotizing concentric glomerulonephritis.  off  Cytoxan , on  chronic prednisone.  Follow-up per renal services.  Admission creatinine of  2.95  -Plan to change to Imuran per nephro  Creatinine 2.61 on 9/20 11.  Pancytopenia/chronic anemia.  Follow-up labs patient had been receiving Aranesp as an outpatient with renal services.  Has had several transfusions  Hemoglobin 7.0 on 9/20, labs ordered for tomorrow.  May need to reconsult GI if continues to trend down.  Continue to monitor  12.  Hyperlipidemia.  Lipitor 13.  History of alcohol use.  Counseling and monitor. 14.  Spasticity  Tizanidine nightly 15. Diarrhea/Bowel issues/Nausea  Zofran prn 4 mg q8 hours prn and monitor  Improving 17.  Seizure left-sided post stroke.    Per Neuro rec changed seizure prophyllaxis to Valproate 750mg  BID with Keppra 500mg  BID.  Had a 1000mg  IV valproate load    Negative  EEG.-As discussed with neurology will get a stat level for valproate and if on the low side may need to increase valproic acid dose.  The patient is also not having generalized seizures  but short duration partial seizures which should be relatively well-tolerated.  If the valproic acid level is on the high side of the normal range, may need to add Vimpat  VPA level low increase to 1000mg  BID, given IV VPA bolus of 500mg  , repeat VPA low but pt seizure free  Plan to repeat level this week dose has been increased  18.  Anemia- initially thought to be related to renal failure as well as pancytopenis, but also has fecal oocult + stool,   EGD performed on 9/19, showing nonbleeding distal esophageal ulcer with erosive gastropathy, recommend repeat EGD in 2 months along with colonoscopy  PPI 40 mg daily  See #11 19.  Steroid-induced hyperglycemia  Wife prefers to check CBGs, "just to know", but does not want correctional insulin.  Patient in agreement 20.  Thrombocytopenia  Platelets 107 on 9/20, labs ordered for tomorrow  LOS: 17 days A FACE TO FACE EVALUATION WAS PERFORMED  Jaquelyn Sakamoto Lorie Phenix 02/01/2019, 12:33 PM

## 2019-02-01 NOTE — Progress Notes (Signed)
[facility] Progress Note    Assessment/ Plan:    1.  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  He was initially treated with pulse-dose steroids followed by taper and cyclophosphamide which he is still on He has achieved remission.  UA is unremarkable, renal function has recovered (still with some resulting CKD), and UP/C 1.5.  MPO ANCA titer is 18.5, initially > 100.  It is coming up to decide his maintenance therapy course (to continue for approximately 18-24 months).  Given history of severe AKI which required dialysis, observation without maintenance therapy is not appropriate.  He has a lesser risk of relapse as opposed to PR-3 ANCA vasculitis.  His options for maintenance are rituximab, Imuran (azathioprine), or Cellcept.  Rituximab has better long-term remission rates as opposed to AZA or Cellcept. There is a practical concern with Ritux right now- if pt had a seizure while receiving infusion, would be impossible to tell if an infusion reaction were taking place vs seizure and could preclude him from receiving therapy with rituximab in the future if he had a relapse.  So- I think I will choose to hold on ritux for now.  AZA is superior to cellcept.  No concerns with drug/ drug interactions other than additive effect with depakote and keppra.  Will d/c cytoxan, allow to wash out for 24-48 hrs, and then begin imuran- started 9/19 at 50 mg daily.  G6PD normal, TPMT pending.  Continue present rx.  Will need f/u with me in 2 weeks from d/c.  Will sign off.  Please call with questions.    2.  Anemia: s/p 2 u pRBCs, Aranesp dosed 9/15, has falling Hgb again, FOBT + on 9/12, agree with GI consult.  Will order another 1 u pRBCs. He has a mild pancytopenia d/t likely med effect (cytoxan), but if nothing is found with GI evaluation may need to consider hematological etiology of anemia.  Got EGD 9/19.- esophageal ulcer and erosive gastropathy,  on PPI now.  Needs EGD as OP and c-scope at the same time.  I'll order CBCB and iron panel,  Increase aranesp to 200 mcg    3.  Seizures: keppra and valproate  4.  S/p ACA stroke: on ASA, d/c'd Plavix.   5.  Dispo: in rehab    Subjective:    Feeling OK this AM.  Had PT.  Doing OK.      Objective:   BP (!) 159/93 (BP Location: Left Arm)   Pulse 69   Temp 97.9 F (36.6 C) (Oral)   Resp 16   Ht 6\' 1"  (1.854 m)   Wt 63.5 kg   SpO2 100%   BMI 18.47 kg/m   Intake/Output Summary (Last 24 hours) at 02/01/2019 1231 Last data filed at 02/01/2019 0723 Gross per 24 hour  Intake 680 ml  Output 1600 ml  Net -920 ml   Weight change: 0.4 kg  Physical Exam: GEN NAD, sitting up in bed, NAD HEENT EOMI PERRL NECK no JVD PULM clear bilaterally no c/w/r CV RRR soft systolic murmur no r/g ABD soft, nontender, NABS EXT no LE edema NEURO AAO x 3 SKIN multiple ecchymoses but no rash, heparin shots with some bruising but no frank hematomas.  Imaging: No results found.  Labs: BMET Recent Labs  Lab 01/26/19 0745 01/29/19 0636 02/01/19 0546  NA 141 139 140  K 3.9 3.5 3.6  CL 110 107 109  CO2 21* 22 24  GLUCOSE 190* 147* 113*  BUN 67* 83* 42*  CREATININE 2.85* 2.84* 2.61*  CALCIUM 8.8* 8.4* 8.5*  PHOS  --   --  3.1   CBC Recent Labs  Lab 01/26/19 0745  01/29/19 0636  01/29/19 1244 01/30/19 0707 01/31/19 1445 02/01/19 0546  WBC 4.2   < > 3.8*  --  3.8*  --  5.4 4.4  NEUTROABS 3.4  --   --   --  2.9  --   --   --   HGB 6.5*   < > 5.8*  5.7*   < > 6.0* 7.5* 8.0* 7.0*  HCT 18.6*   < > 16.5*   < > 16.3* 20.5* 21.7* 19.5*  MCV 101.6*   < > 97.1  --  97.0  --  95.2 96.1  PLT 129*   < > 106*  --  105*  --  116* 107*   < > = values in this interval not displayed.    Medications:    . amLODipine  5 mg Oral Daily  . aspirin EC  81 mg Oral Daily  . atorvastatin  80 mg Oral q1800  . azaTHIOprine  50 mg Oral Daily  . [START ON 02/03/2019] darbepoetin (ARANESP) injection -  NON-DIALYSIS  200 mcg Subcutaneous Q Tue-1800  . divalproex  1,000 mg Oral Q12H  . insulin aspart  0-9 Units Subcutaneous TID WC  . levETIRAcetam  500 mg Oral BID  . pantoprazole  40 mg Oral Daily  . predniSONE  5 mg Oral Q breakfast  . tiZANidine  2 mg Oral QHS      Madelon Lips, MD 02/01/2019, 12:31 PM

## 2019-02-01 NOTE — Progress Notes (Signed)
Physical Therapy Session Note  Patient Details  Name: Jeremy Grant MRN: [id number] Date of Birth: 07-02-1960  Today's Date: 02/01/2019 PT Individual Time: 0803-0900 PT Individual Time Calculation (min): 57 min   Short Term Goals: Week 2:  PT Short Term Goal 1 (Week 2): = to LTGs based on ELOS PT Short Term Goal 1 - Progress (Week 2): Progressing toward goal  Skilled Therapeutic Interventions/Progress Updates:    Pt supine in bed upon PT arrival, agreeable to therapy tx and denies pain. Pt reports he did not sleep well last night, he could not get comfortable. Pt's wife present this session for family education. Pt transferred to sitting EOB this session with supervision. While in sitting pt maintain dynamic sitting balance with supervision while donning shoes. Pt donned pants, performed sit<>stand with RW and CGA to pull pants over hips, educated wife on techniques and safety. Pt performed squat pivot from bed>w/c with supervision towards the R and CGA towards the L, performed x 1 in each direction with therapist and performed x 1 in each direction with assist from wife. Pt propelled w/c x 80 ft with supervision using B UEs, transported the rest of the way to the gym. Therapist donned L Ace wrap for DF (pt not liking fit of AFO). Pt ambulated x 50 ft with min assist from therapist and RW, pt ambulated another 56 ft with assist from wife, therapist providing education throughout on techniques/safety. Pt's wife asking about exercises for home, therapist with provide handout next session. Pt performed sit<>stands without UE support for strengthening, x 10 with assist from therapist and x 10 with assist from wife. Continued education on heel cord and hamstring stretching. Pt ambulated x 40 ft to steps and then ascended/descended 4 steps with single rail and min-mod assist, step to pattern with cues for techniques, hyperextension noted L LE when descending. Therapist provided education on techniques,  will have wife perform during next fam ed session. Pt transported back to room and left in w/c, therapist checked wife off for bed<>chair transfers.   Therapy Documentation Precautions:  Precautions Precautions: Fall Precaution Comments: LLE paresis more severe than in the LUE Restrictions Weight Bearing Restrictions: No    Therapy/Group: Individual Therapy  Netta Corrigan, PT, DPT 02/01/2019, 7:52 AM

## 2019-02-02 ENCOUNTER — Inpatient Hospital Stay (HOSPITAL_COMMUNITY): Payer: BC Managed Care – PPO | Admitting: Occupational Therapy

## 2019-02-02 ENCOUNTER — Inpatient Hospital Stay (HOSPITAL_COMMUNITY): Payer: BC Managed Care – PPO

## 2019-02-02 ENCOUNTER — Other Ambulatory Visit: Payer: Self-pay

## 2019-02-02 LAB — CBC WITH DIFFERENTIAL/PLATELET
Abs Immature Granulocytes: 0.05 10*3/uL (ref 0.00–0.07)
Basophils Absolute: 0 10*3/uL (ref 0.0–0.1)
Basophils Relative: 1 %
Eosinophils Absolute: 0.1 10*3/uL (ref 0.0–0.5)
Eosinophils Relative: 2 %
HCT: 19.9 % — ABNORMAL LOW (ref 39.0–52.0)
Hemoglobin: 6.9 g/dL — CL (ref 13.0–17.0)
Immature Granulocytes: 1 %
Lymphocytes Relative: 12 %
Lymphs Abs: 0.4 10*3/uL — ABNORMAL LOW (ref 0.7–4.0)
MCH: 34.3 pg — ABNORMAL HIGH (ref 26.0–34.0)
MCHC: 34.7 g/dL (ref 30.0–36.0)
MCV: 99 fL (ref 80.0–100.0)
Monocytes Absolute: 0.4 10*3/uL (ref 0.1–1.0)
Monocytes Relative: 11 %
Neutro Abs: 2.7 10*3/uL (ref 1.7–7.7)
Neutrophils Relative %: 73 %
Platelets: 109 10*3/uL — ABNORMAL LOW (ref 150–400)
RBC: 2.01 MIL/uL — ABNORMAL LOW (ref 4.22–5.81)
RDW: 17.6 % — ABNORMAL HIGH (ref 11.5–15.5)
WBC: 3.7 10*3/uL — ABNORMAL LOW (ref 4.0–10.5)
nRBC: 0 % (ref 0.0–0.2)

## 2019-02-02 LAB — GLUCOSE, CAPILLARY
Glucose-Capillary: 98 mg/dL (ref 70–99)
Glucose-Capillary: 130 mg/dL — ABNORMAL HIGH (ref 70–99)
Glucose-Capillary: 138 mg/dL — ABNORMAL HIGH (ref 70–99)
Glucose-Capillary: 144 mg/dL — ABNORMAL HIGH (ref 70–99)

## 2019-02-02 LAB — BASIC METABOLIC PANEL
Anion gap: 11 (ref 5–15)
BUN: 39 mg/dL — ABNORMAL HIGH (ref 6–20)
CO2: 22 mmol/L (ref 22–32)
Calcium: 8.5 mg/dL — ABNORMAL LOW (ref 8.9–10.3)
Chloride: 109 mmol/L (ref 98–111)
Creatinine, Ser: 2.73 mg/dL — ABNORMAL HIGH (ref 0.61–1.24)
GFR calc Af Amer: 28 mL/min — ABNORMAL LOW (ref >60)
GFR calc non Af Amer: 24 mL/min — ABNORMAL LOW (ref >60)
Glucose, Bld: 107 mg/dL — ABNORMAL HIGH (ref 70–99)
Potassium: 3.8 mmol/L (ref 3.5–5.1)
Sodium: 142 mmol/L (ref 135–145)

## 2019-02-02 NOTE — Progress Notes (Signed)
Orthopedic Tech Progress Note Patient Details:  Jeremy Grant 1960-09-10 [id number] Called [facility] for knee cage. Patient ID: Jeremy Grant, male   DOB: Oct 31, 1960, 58 y.o.   MRN: [id number]   Melony Overly T 02/02/2019, 12:36 PM

## 2019-02-02 NOTE — Progress Notes (Signed)
Social Work Discharge Note   The overall goal for the admission was met for:   Discharge location: Yes-HOME WITH WIFE WHO CAN PROVIDE SUPERVISION LEVEL  Length of Stay: Yes-20 DAYS  Discharge activity level: Yes-SUPERVISION LEVEL  Home/community participation: Yes  Services provided included: MD, RD, PT, OT, RN, CM, Pharmacy, Neuropsych and SW  Financial Services: Private Insurance: [employer]  Follow-up services arranged: Outpatient: [facility]-PT & OT 9/28 5:00-5:45, 10/1 11:00-11:45, DME: [facility] and Patient/Family has no preference for HH/DME agencies  Comments (or additional information):WIFE IS HERE DAILY AND AWARE OF PT'S CARE NEEDS.  Patient/Family verbalized understanding of follow-up arrangements: Yes  Individual responsible for coordination of the follow-up plan: BRENDA-WIFE  Confirmed correct DME delivered: Elease Hashimoto 02/02/2019    Elease Hashimoto

## 2019-02-02 NOTE — Progress Notes (Signed)
Physical Therapy Session Note  Patient Details  Name: Jeremy Grant MRN: [id number] Date of Birth: 08-31-1960  Today's Date: 02/02/2019 PT Individual Time: 0918-1030 PT Individual Time Calculation (min): 72 min   Short Term Goals: Week 2:  PT Short Term Goal 1 (Week 2): = to LTGs based on ELOS PT Short Term Goal 1 - Progress (Week 2): Progressing toward goal  Skilled Therapeutic Interventions/Progress Updates:    Pt seated in w/c upon PT arrival, agreeable to therapy tx and denies pain at rest. Pt transported to the gym in w/c this session. Therapist donned L AFO, tied shoes tighter to see if this would help with brace fit and gait. Pt ambulated 2 x 50 ft this session using RW and min assist, cues for increased R step length/longer L LE stance time, pt with improved gait today, hyperextends L knee during stance about 15% of the time otherwise remains flexed, cues for wider L foot placement with turns to minimize ankle eversion. Pt transported to the steps. Worked on stair navigation this session with use of B UE support on single rail and L AFO, cues for techniques and sequencing, facilitation for L knee control: ascended/descended 4 steps x 2 with min-mod assist. Pt ambulated x 40 ft and x 60 ft with trial of L knee cage to block hyperextension, min assist with RW. Pt worked on L knee control and weightbearing with use of L knee cage to performed 2 x 15 lateral steps in place without UE support, facilitation for weightshifting, min-mod assist. PT ascended/descended steps again this session with R rail and therapist standing beside pt on L side with L UE around therapist, step to pattern with min assist and cues for techniques, pt with improved step pattern this session and increased safety overall, knee cage also donned for this. Pt performed squat pivot transfers x4 this session working on increased independence with set up and transfer, pt performed all w/c parts management with min cues and  performed squat pivot with supervision and cues for techniques/safety. Pt transported back to room in w/c, transferred to bed with supervision squat pivot. Instructed pt to leave L AFO donned for L ankle DF ROM and for orthotic tolerance. Pt left with needs in reach and bed alarm set.   Therapy Documentation Precautions:  Precautions Precautions: Fall Precaution Comments: LLE paresis more severe than in the LUE Restrictions Weight Bearing Restrictions: No    Therapy/Group: Individual Therapy  Netta Corrigan, PT, DPT 02/02/2019, 7:57 AM

## 2019-02-02 NOTE — Progress Notes (Signed)
Nutrition Follow-up  DOCUMENTATION CODES:   Underweight  INTERVENTION:   -Continue snacks BID -Pt's wife to bring in food from home to aid PO intake at meals  NUTRITION DIAGNOSIS:   Increased nutrient needs related to chronic illness(CHF) as evidenced by estimated needs.  Ongoing  GOAL:   Patient will meet greater than or equal to 90% of their needs  Progressing   MONITOR:   PO intake, Labs, Weight trends, I & O's  REASON FOR ASSESSMENT:   Other (Comment)(underweight BMI)    ASSESSMENT:   58 year old male with PMH of nonischemic cardiomyopathy with right heart catheterization 2015, shingles with postherpetic neuralgia hypertension, CHF with ejection fraction of 20%, EtOH abuse, CKD stage IV and glomerulonephritis. Presented 01/12/19 when his left leg became weak and fell without LOC. MRI of the brain showed patchy area of acute infarction in the deep white matter on the right including the corpus callosum possible watershed type infarct. Pt did not receive TPA. MRI lumbar spine showed grade 1 anterior listhesis of L4 on L5 with mild central cord protrusion. 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 Pt admitted to CIR on 9/03.  Reviewed I/O's: _1.1 L x 24 hours and -9.9 L since 01/19/19  UOP: 1.7 L x 24 hours  Pt sleeping soundly in bed at time of visit. No family members at bedside. Pt did not respond to name being called.   Pt continues with excellent appetite; noted meal completion 75-100%. Per preior RD notes, pt wife also bringing outside food for pt to enjoy. He is also receiving between meal nourishments per nutritional services.   Wt has been stable over the past week.   Medications reviewed and include prednisone.   Labs reviewed: CBGS: 98-143.   Diet Order:   Diet Order            DIET SOFT Room service appropriate? Yes; Fluid consistency: Thin  Diet  effective now              EDUCATION NEEDS:   Education needs have been addressed  Skin:  Skin Assessment: Reviewed RN Assessment  Last BM:  02/01/19  Height:   Ht Readings from Last 1 Encounters:  01/19/19 6\' 1"  (1.854 m)    Weight:   Wt Readings from Last 1 Encounters:  02/02/19 63.3 kg    Ideal Body Weight:  83.6 kg  BMI:  Body mass index is 18.41 kg/m.  Estimated Nutritional Needs:   Kcal:  2000-2200  Protein:  90-105 grams  Fluid:  >/= 1.8 L    Hiroki Wint A. Jimmye Norman, RD, LDN, [place] Registered Dietitian II Certified Diabetes Care and Education Specialist Pager: [phone] After hours Pager: [phone]

## 2019-02-02 NOTE — Op Note (Signed)
[facility] Patient Name: Jeremy Grant Procedure Date : 01/31/2019 MRN: [id number] Attending MD: Otis Brace , MD Date of Birth: 07-30-1960 CSN: [id number] Age: 58 Admit Type: Inpatient Procedure:                Upper GI endoscopy Indications:              Iron deficiency anemia, Heme positive stool Providers:                Otis Brace, MD, Carlyn Reichert, RN, Laverda Sorenson, Technician, Clearnce Sorrel, CRNA Referring MD:              Medicines:                Sedation Administered by an Anesthesia Professional Complications:            No immediate complications. Estimated Blood Loss:     Estimated blood loss was minimal. Procedure:                Pre-Anesthesia Assessment:                           - Prior to the procedure, a History and Physical                            was performed, and patient medications and                            allergies were reviewed. The patient's tolerance of                            previous anesthesia was also reviewed. The risks                            and benefits of the procedure and the sedation                            options and risks were discussed with the patient.                            All questions were answered, and informed consent                            was obtained. Prior Anticoagulants: The patient                            last took Plavix (clopidogrel) 3 days prior to the                            procedure. ASA Grade Assessment: III - A patient                            with severe systemic disease. After reviewing the  risks and benefits, the patient was deemed in                            satisfactory condition to undergo the procedure.                           After obtaining informed consent, the endoscope was                            passed under direct vision. Throughout the                            procedure, the patient's  blood pressure, pulse, and                            oxygen saturations were monitored continuously. The                            GIF-H190 ([id number]) Olympus gastroscope was                            introduced through the mouth, and advanced to the                            second part of duodenum. The upper GI endoscopy was                            accomplished without difficulty. The patient                            tolerated the procedure well. Scope In: Scope Out: Findings:      One linear and superficial esophageal ulcer with no bleeding was found       at the gastroesophageal junction.      Scattered mild mucosal changes characterized by nodularity were found in       the entire esophagus. Biopsies were taken with a cold forceps for       histology.      Multiple dispersed, non-bleeding erosions were found in the gastric body       and in the gastric antrum. There were stigmata of recent bleeding.       Biopsies were taken with a cold forceps for histology.      The cardia and gastric fundus were normal on retroflexion.      The duodenal bulb, first portion of the duodenum and second portion of       the duodenum were normal. Biopsies were taken with a cold forceps for       histology. Impression:               - Non-bleeding esophageal ulcer.                           - Nodular mucosa in the esophagus. Biopsied.                           - Recently bleeding erosive gastropathy. Biopsied.                           -  Normal duodenal bulb, first portion of the                            duodenum and second portion of the duodenum.                            Biopsied. Recommendation:           - Return patient to hospital ward for ongoing care.                           - Soft diet.                           - Continue present medications.                           - Await pathology results.                           - Repeat upper endoscopy in 2 months to check                             healing. Procedure Code(s):        --- Professional ---                           [id number], Esophagogastroduodenoscopy, flexible,                            transoral; with biopsy, single or multiple Diagnosis Code(s):        --- Professional ---                           K22.10, Ulcer of esophagus without bleeding                           K22.8, Other specified diseases of esophagus                           K31.89, Other diseases of stomach and duodenum                           K92.2, Gastrointestinal hemorrhage, unspecified                           D50.9, Iron deficiency anemia, unspecified                           R19.5, Other fecal abnormalities CPT copyright 2019 American Medical Association. All rights reserved. The codes documented in this report are preliminary and upon coder review may  be revised to meet current compliance requirements. Otis Brace, MD Otis Brace, MD 01/31/2019 9:02:58 AM Number of Addenda: 0

## 2019-02-02 NOTE — Discharge Instructions (Signed)
Inpatient Rehab Discharge Instructions  Jeremy Grant Discharge date and time: No discharge date for patient encounter.   Activities/Precautions/ Functional Status: Activity: activity as tolerated Diet: regular diet Wound Care: none needed Functional status:  ___ No restrictions     ___ Walk up steps independently ___ 24/7 supervision/assistance   ___ Walk up steps with assistance ___ Intermittent supervision/assistance  ___ Bathe/dress independently ___ Walk with walker     _x__ Bathe/dress with assistance ___ Walk Independently    ___ Shower independently ___ Walk with assistance    ___ Shower with assistance ___ No alcohol     ___ Return to work/school ________  Special Instructions: No driving smoking or alcohol  Follow-up GI 2 months for repeat EGD possible colonoscopy   COMMUNITY REFERRALS UPON DISCHARGE:    Outpatient: PT & OT  Agency:[facility] Phone:[phone]   Date of Last Service:02/04/2019  Appointment Date/Time:9/28 4:45-5:45 PM  OCT 1 11:00-11:45 AM  Medical Equipment/Items Ordered:WHEELCHAIR  Agency/Supplier:[facility]   [phone]   STROKE/TIA DISCHARGE INSTRUCTIONS SMOKING Cigarette smoking nearly doubles your risk of having a stroke & is the single most alterable risk factor  If you smoke or have smoked in the last 12 months, you are advised to quit smoking for your health.  Most of the excess cardiovascular risk related to smoking disappears within a year of stopping.  Ask you doctor about anti-smoking medications  [place] Quit Line: 1-800-QUIT NOW  Free Smoking Cessation Classes [phone]  CHOLESTEROL Know your levels; limit fat & cholesterol in your diet  Lipid Panel     Component Value Date/Time   CHOL 193 01/13/2019 0303   TRIG 197 (H) 01/13/2019 0303   HDL 32 (L) 01/13/2019 0303   CHOLHDL 6.0 01/13/2019 0303   VLDL 39 01/13/2019 0303   LDLCALC 122 (H) 01/13/2019 0303      Many patients benefit from treatment  even if their cholesterol is at goal.  Goal: Total Cholesterol (CHOL) less than 160  Goal:  Triglycerides (TRIG) less than 150  Goal:  HDL greater than 40  Goal:  LDL (LDLCALC) less than 100   BLOOD PRESSURE American Stroke Association blood pressure target is less that 120/80 mm/Hg  Your discharge blood pressure is:  BP: (!) 174/101  Monitor your blood pressure  Limit your salt and alcohol intake  Many individuals will require more than one medication for high blood pressure  DIABETES (A1c is a blood sugar average for last 3 months) Goal HGBA1c is under 7% (HBGA1c is blood sugar average for last 3 months)  Diabetes: No known diagnosis of diabetes    Lab Results  Component Value Date   HGBA1C 5.4 01/12/2019     Your HGBA1c can be lowered with medications, healthy diet, and exercise.  Check your blood sugar as directed by your physician  Call your physician if you experience unexplained or low blood sugars.  PHYSICAL ACTIVITY/REHABILITATION Goal is 30 minutes at least 4 days per week  Activity: Increase activity slowly, Therapies: Physical Therapy: Home Health Return to work:   Activity decreases your risk of heart attack and stroke and makes your heart stronger.  It helps control your weight and blood pressure; helps you relax and can improve your mood.  Participate in a regular exercise program.  Talk with your doctor about the best form of exercise for you (dancing, walking, swimming, cycling).  DIET/WEIGHT Goal is to maintain a healthy weight  Your discharge diet is:  Diet Order  Diet Heart Room service appropriate? Yes; Fluid consistency: Thin  Diet effective now             liquids Your height is:    Your current weight is: Weight: 63.3 kg Your Body Mass Index (BMI) is:  BMI (Calculated): 18.42  Following the type of diet specifically designed for you will help prevent another stroke.  Your goal weight range is:    Your goal Body Mass Index  (BMI) is 19-24.  Healthy food habits can help reduce 3 risk factors for stroke:  High cholesterol, hypertension, and excess weight.  RESOURCES Stroke/Support Group:  Call [phone]   STROKE EDUCATION PROVIDED/REVIEWED AND GIVEN TO PATIENT Stroke warning signs and symptoms How to activate emergency medical system (call 911). Medications prescribed at discharge. Need for follow-up after discharge. Personal risk factors for stroke. Pneumonia vaccine given:  Flu vaccine given:  My questions have been answered, the writing is legible, and I understand these instructions.  I will adhere to these goals & educational materials that have been provided to me after my discharge from the hospital.      My questions have been answered and I understand these instructions. I will adhere to these goals and the provided educational materials after my discharge from the hospital.  Patient/Caregiver Signature _______________________________ Date __________  Clinician Signature _______________________________________ Date __________  Please bring this form and your medication list with you to all your follow-up doctor's appointments.

## 2019-02-02 NOTE — Plan of Care (Signed)
  Problem: RH Simple Meal Prep Goal: LTG Patient will perform simple meal prep w/assist (OT) Description: LTG: Patient will perform simple meal prep with assistance, with/without cues (OT). Outcome: Not Applicable Flowsheets (Taken 02/02/2019 1049) LTG: Pt will perform simple meal prep with assistance level of: (Goal discharged based on pt having 24 hr assist initially) --   Problem: Sit to Stand Goal: LTG:  Patient will perform sit to stand in prep for activites of daily living with assistance level (OT) Description: LTG:  Patient will perform sit to stand in prep for activites of daily living with assistance level (OT) Flowsheets (Taken 02/02/2019 1049) LTG: PT will perform sit to stand in prep for activites of daily living with assistance level: (Goal downgraded based on medical limitations) Minimal Assistance - Patient > 75%   Problem: RH Dressing Goal: LTG Patient will perform lower body dressing w/assist (OT) Description: LTG: Patient will perform lower body dressing with assist, with/without cues in positioning using equipment (OT) Flowsheets (Taken 02/02/2019 1049) LTG: Pt will perform lower body dressing with assistance level of: (Goal downgraded based on medical limitations) Minimal Assistance - Patient > 75%   Problem: RH Toileting Goal: LTG Patient will perform toileting task (3/3 steps) with assistance level (OT) Description: LTG: Patient will perform toileting task (3/3 steps) with assistance level (OT)  Flowsheets (Taken 02/02/2019 1049) LTG: Pt will perform toileting task (3/3 steps) with assistance level: (Goal downgraded based on medical issues.) Minimal Assistance - Patient > 75%   Problem: RH Toilet Transfers Goal: LTG Patient will perform toilet transfers w/assist (OT) Description: LTG: Patient will perform toilet transfers with assist, with/without cues using equipment (OT) Flowsheets (Taken 02/02/2019 1049) LTG: Pt will perform toilet transfers with assistance level of:  (Goal downgraded based on medical limitations in progress) Minimal Assistance - Patient > 75%   Problem: RH Tub/Shower Transfers Goal: LTG Patient will perform tub/shower transfers w/assist (OT) Description: LTG: Patient will perform tub/shower transfers with assist, with/without cues using equipment (OT) Flowsheets (Taken 02/02/2019 1049) LTG: Pt will perform tub/shower stall transfers with assistance level of: (Goal downgraded based on medical limitations) Minimal Assistance - Patient > 75% LTG: Pt will perform tub/shower transfers from: Tub/shower combination

## 2019-02-02 NOTE — Progress Notes (Signed)
[facility] PHYSICAL MEDICINE & REHABILITATION PROGRESS NOTE   Subjective/Complaints:  Pt reports slept well- eating well- denies pain. Says feels same- even though Hb slightly down from 7.0 to 6.9-   ROS: Denies CP, SOB, N/V/D  Objective:   No results found. Recent Labs    02/01/19 0546 02/02/19 0537  WBC 4.4 3.7*  HGB 7.0* 6.9*  HCT 19.5* 19.9*  PLT 107* 109*   Recent Labs    02/01/19 0546 02/02/19 0537  NA 140 142  K 3.6 3.8  CL 109 109  CO2 24 22  GLUCOSE 113* 107*  BUN 42* 39*  CREATININE 2.61* 2.73*  CALCIUM 8.5* 8.5*    Intake/Output Summary (Last 24 hours) at 02/02/2019 1023 Last data filed at 02/02/2019 0736 Gross per 24 hour  Intake 720 ml  Output 1450 ml  Net -730 ml     Physical Exam: Vital Signs Blood pressure (!) 143/99, pulse 61, temperature 97.8 F (36.6 C), temperature source Oral, resp. rate 16, height 6\' 1"  (1.854 m), weight 63.3 kg, SpO2 100 %. Constitutional: No distress . Vital signs reviewed. Sitting up in manual w/c in room, grooming with OT, NAD HENT: Normocephalic.  Atraumatic. Eyes: EOMI. No discharge. Cardiovascular: No JVD Respiratory: Normal effort.  No stridor. GI: Non-distended. Skin: Scattered ecchymosis Psych: Somewhat flat Musc: No edema in extremities.  No tenderness in extremities. Motor: Alert RLE: 3+/5 proximal and distal, unchanged LUE: 4-/5 proximal distal LLE: 3-/5 hip flexion, knee extension, 0/5 ankle dorsiflexion, unchanged  Assessment/Plan: 1. 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  Physiatrist is providing close team supervision and 24 hour management of active medical problems listed below.  Physiatrist and rehab team continue to assess barriers to discharge/monitor patient progress toward functional and medical goals  Care Tool:  Bathing  Bathing activity did not occur:  Refused Body parts bathed by patient: Right arm, Left arm, Chest, Abdomen, Right upper leg, Left upper leg, Right lower leg, Left lower leg, Face, Front perineal area, Buttocks   Body parts bathed by helper: Front perineal area, Buttocks     Bathing assist Assist Level: Supervision/Verbal cueing     Upper Body Dressing/Undressing Upper body dressing   What is the patient wearing?: Pull over shirt    Upper body assist Assist Level: Set up assist    Lower Body Dressing/Undressing Lower body dressing      What is the patient wearing?: Pants     Lower body assist Assist for lower body dressing: Contact Guard/Touching assist     Toileting Toileting    Toileting assist Assist for toileting: Independent with assistive device Assistive Device Comment: urinal   Transfers Chair/bed transfer  Transfers assist     Chair/bed transfer assist level: Contact Guard/Touching assist     Locomotion Ambulation   Ambulation assist      Assist level: Minimal Assistance - Patient > 75% Assistive device: Walker-rolling Max distance: 50 ft   Walk 10 feet activity   Assist     Assist level: Minimal Assistance - Patient > 75% Assistive device: Walker-rolling   Walk 50 feet activity   Assist    Assist level: Minimal Assistance - Patient > 75% Assistive device: Walker-rolling    Walk 150 feet activity   Assist Walk 150 feet activity did not occur: Safety/medical concerns  Assist level: Minimal Assistance - Patient > 75% Assistive device: Walker-rolling    Walk  10 feet on uneven surface  activity   Assist Walk 10 feet on uneven surfaces activity did not occur: Safety/medical concerns         Wheelchair     Assist Will patient use wheelchair at discharge?: Yes Type of Wheelchair: Manual    Wheelchair assist level: Set up assist, Supervision/Verbal cueing Max wheelchair distance: 163ft    Wheelchair 50 feet with 2 turns activity    Assist         Assist Level: Set up assist, Supervision/Verbal cueing   Wheelchair 150 feet activity     Assist      Assist Level: Supervision/Verbal cueing, Set up assist   Blood pressure (!) 143/99, pulse 61, temperature 97.8 F (36.6 C), temperature source Oral, resp. rate 16, height 6\' 1"  (1.854 m), weight 63.3 kg, SpO2 100 %.    Medical Problem List and Plan: 1.  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  Plan 30-day cardiac event monitor as outpatient tent d/c 9/23-As per nephrology note.  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  This is unlikely to improve at this point  Continue CIR  9/21- per OT, pt is still at CGA_min assist- pt is insistent will go home on 9/23 "No matter what"-  2.  Antithrombotics: -DVT/anticoagulation:  plt on low side with bruising off  enoxaparin             -antiplatelet therapy: After discussion with neuro, Plavix DC'd, continue ASA 81  3. Pain Management: Tylenol as needed 4. Mood: Provide emotional support             -antipsychotic agents: N/A 5. Neuropsych: This patient is capable of making decisions on his own behalf. 6. Skin/Wound Care: Routine skin checks 7. Fluids/Electrolytes/Nutrition: Routine I/Os 8.  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  Monitor for any signs of fluid overload Filed Weights   01/31/19 0500 02/01/19 0446 02/02/19 0500  Weight: 63.1 kg 63.5 kg 63.3 kg   Stable on 9/20 9.  Hypertension.    Vitals:   02/01/19 1944 02/02/19 0510  BP: 139/77 (!) 143/99  Pulse: 78 61  Resp: 17 16  Temp: 98.3 F (36.8 C) 97.8 F (36.6 C)  SpO2: 99% 100%   Amlodipine 5mg , increased to 10 on 9/20 10.  ANCA necrotizing concentric glomerulonephritis.  off  Cytoxan , on  chronic prednisone.  Follow-up per renal services.  Admission creatinine of  2.95  -Plan to change to Imuran per nephro  Creatinine 2.61 on 9/20 11.  Pancytopenia/chronic anemia.  Follow-up labs patient had been receiving Aranesp as an outpatient with renal services.  Has had several transfusions  Hemoglobin 7.0 on 9/20, labs ordered for tomorrow.  May need to reconsult GI if continues to trend down.  9/21- Hb 6.9- GI called  Continue to monitor  12.  Hyperlipidemia.  Lipitor 13.  History of alcohol use.  Counseling and monitor. 14.  Spasticity  Tizanidine nightly 15. Diarrhea/Bowel issues/Nausea  Zofran prn 4 mg q8 hours prn and monitor  Improving 17.  Seizure left-sided post stroke.    Per Neuro rec changed seizure prophyllaxis to Valproate 750mg  BID with Keppra 500mg  BID.  Had a 1000mg  IV valproate load    Negative  EEG.-As discussed with neurology will get a stat level for valproate and if on the low side may need to increase  valproic acid dose.  The patient is also not having generalized seizures but short duration partial seizures which should be relatively well-tolerated.  If the valproic acid level is on the high side of the normal range, may need to add Vimpat  VPA level low increase to 1000mg  BID, given IV VPA bolus of 500mg  , repeat VPA low but pt seizure free  Plan to repeat level this week dose has been increased  18.  Anemia- initially thought to be related to renal failure as well as pancytopenis, but also has fecal oocult + stool,   EGD performed on 9/19, showing nonbleeding distal esophageal ulcer with erosive gastropathy, recommend repeat EGD in 2 months along with colonoscopy  PPI 40 mg daily  See #11  9/21- WBC down also to 3.7 19.  Steroid-induced hyperglycemia  Wife prefers to check CBGs, "just to know", but does not want correctional insulin.  Patient in agreement 20.  Thrombocytopenia  Platelets 107 on 9/20, labs ordered for tomorrow  9/21- Plt 109k  LOS: 18 days A FACE TO FACE EVALUATION WAS PERFORMED  Jeremy Grant 02/02/2019, 10:23  AM

## 2019-02-02 NOTE — Discharge Summary (Addendum)
Physician Discharge Summary  Patient ID: VERNAL HRITZ MRN: [id number] DOB/AGE: Jun 24, 1960 58 y.o.  Admit date: 01/15/2019 Discharge date: 02/04/2019  Discharge Diagnoses:  Principal Problem:   Acute unilateral cerebral infarction in a watershed distribution [facility]) Active Problems:   Malignant hypertension   Chronic combined systolic and diastolic CHF (congestive heart failure) (HCC)   CVA (cerebral vascular accident) ([place])   Steroid-induced hyperglycemia   Acute on chronic anemia   Seizures (HCC)   Spastic hemiparesis (HCC)   ANCA-associated vasculitis (HCC)   Hypertension   Thrombocytopenia (HCC)   Acute blood loss anemia DVT prophylaxis Nonischemic cardiomyopathy  Discharged Condition: Stable  Significant Diagnostic Studies: Ct Head Wo Contrast  Result Date: 01/20/2019 CLINICAL DATA:  58 year old with nonischemic cardiomyopathy, combined systolic and diastolic heart failure, presenting with new onset seizures. EXAM: CT HEAD WITHOUT CONTRAST TECHNIQUE: Contiguous axial images were obtained from the base of the skull through the vertex without intravenous contrast. COMPARISON:  MRI brain 01/11/2019. No prior CT head. 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 No associated hemorrhage or hematoma. No mass effect or midline shift. No extra-axial fluid collections. No new/acute intracranial abnormalities. Ventricular system normal in size and appearance for age. No mass lesion. Vascular: Moderate BILATERAL carotid siphon and severe BILATERAL vertebral artery atherosclerosis. No hyperdense vessel. Skull: No skull fracture or other focal osseous abnormality involving the skull. Sinuses/Orbits: Visualized paranasal sinuses, bilateral mastoid air cells and bilateral middle ear cavities well-aerated. Visualized orbits and globes normal in appearance.  Other: None. IMPRESSION: 1. 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 No associated hemorrhage or hematoma. 2. No new/acute intracranial abnormality. Electronically Signed   By: Evangeline Dakin M.D.   On: 01/20/2019 19:11   Mr Angio Head Wo Contrast  Result Date: 01/12/2019 CLINICAL DATA:  Left leg weakness. Right hemisphere more than left hemisphere infarctions. EXAM: MRA HEAD WITHOUT CONTRAST TECHNIQUE: Angiographic images of the Circle of Willis were obtained using MRA technique without intravenous contrast. COMPARISON:  MRI brain same day FINDINGS: Both internal carotid arteries are widely patent through the skull base and siphon regions. The anterior and middle cerebral vessels are patent proximally. No MCA territory abnormality is seen. The left anterior cerebral artery appears normal. There are abnormalities in the right anterior cerebral artery that could be stenoses or emboli. These are seen in the Pericallosal region. Both vertebral arteries are patent to the basilar. No basilar stenosis. Posterior circulation branch vessels are normal. IMPRESSION: Abnormal appearance of the right anterior cerebral artery in the pericallosal region most likely representing stenoses secondary to emboli. Electronically Signed   By: Nelson Chimes M.D.   On: 01/12/2019 14:56   Mr Brain Wo Contrast  Result Date: 01/12/2019 CLINICAL DATA:  Left leg muscle weakness. EXAM: MRI HEAD WITHOUT CONTRAST TECHNIQUE: Multiplanar, multiecho pulse sequences of the brain and surrounding structures were obtained without intravenous contrast. COMPARISON:  None. FINDINGS: Brain: Acute infarct in the deep white matter on the right involving the corpus callosum and frontal parietal deep white matter. Numerous small patchy areas of acute infarct on the right Facilitated diffusion in the left frontal white matter  may be due to subacute infarct. Chronic microvascular ischemic changes in the white matter. Small chronic infarct left frontal cortex. Negative  for mass or hydrocephalus. Mild atrophy. Chronic microhemorrhage right lateral cerebellum. No midline shift. Vascular: Normal arterial flow voids Skull and upper cervical spine: Negative Sinuses/Orbits: Mild mucosal edema paranasal sinuses.  Normal orbit Other: None IMPRESSION: Patchy areas of acute infarct in the deep white matter on the right including the corpus callosum. Possible watershed type infarct. Chronic microvascular ischemic changes in the white matter. Electronically Signed   By: Franchot Gallo M.D.   On: 01/12/2019 10:43   Mr Lumbar Spine Wo Contrast  Result Date: 01/12/2019 CLINICAL DATA:  Muscle weakness left leg pain EXAM: MRI LUMBAR SPINE WITHOUT CONTRAST TECHNIQUE: Multiplanar, multisequence MR imaging of the lumbar spine was performed. No intravenous contrast was administered. COMPARISON:  None. FINDINGS: Segmentation: There are 5 non-rib bearing lumbar type vertebral bodies with the last intervertebral disc space labeled as L5-S1. Alignment: There is a minimal anterolisthesis of L4 on L5 measuring 3 mm. A minimal retrolisthesis of L1 on L2 is seen. Vertebrae: The vertebral body heights are well maintained. No fracture, marrow edema,or pathologic marrow infiltration. Conus medullaris and cauda equina: Conus extends to the L1 level. Conus and cauda equina appear normal. Paraspinal and other soft tissues: The paraspinal soft tissues and visualized retroperitoneal structures are unremarkable. The sacroiliac joints are intact. Disc levels: T12-L1:  No significant canal or neural foraminal narrowing. L1-L2: There is a minimal broad-based disc bulge which causes mild left neural foraminal narrowing. L2-L3: There is a minimal broad-based disc bulge with ligamentum flavum hypertrophy which causes moderate bilateral neural foraminal narrowing. L3-L4: There is  a broad-based disc bulge with ligamentum flavum hypertrophy which causes mild bilateral neural foraminal narrowing. L4-L5: There is a broad-based disc bulge with disc uncovering and a central disc protrusion. The disc protrusion contacts the bilateral descending L5 nerve roots. There is facet arthrosis and ligamentum flavum hypertrophy. There is large bulky right-sided osteophyte which causes effacement of the thecal sac and slight displacement to the left. Moderate bilateral neural foraminal narrowing is seen. There is mild central canal narrowing. L5-S1: There is a broad-based disc bulge with a central disc protrusion and annular fissure. The disc protrusion contacts the right S1 nerve root. There is moderate bilateral neural foraminal narrowing, left greater than right. IMPRESSION: 1. Grade 1 anterolisthesis of L4 on L5. 2. Lumbar spine spondylosis most notable at L4-L5 with a central disc protrusion contacting the bilateral descending L5 nerve roots and large bulky right-sided osteophytes which causes effacement of the thecal sac. There is moderate bilateral neural foraminal narrowing and mild central canal stenosis at this level. 3. Also at L5-S1 there is a central disc protrusion with annular fissure and the disc protrusion contacts the right S1 nerve root. Electronically Signed   By: Prudencio Pair M.D.   On: 01/12/2019 10:51   Vas [place] Transcranial Doppler W Bubbles  Result Date: 01/13/2019  Transcranial Doppler with Bubble Indications: Stroke. Performing Technologist: June Leap RDMS, RVT  Examination Guidelines: A complete evaluation includes B-mode imaging, spectral Doppler, color Doppler, and power Doppler as needed of all accessible portions of each vessel. Bilateral testing is considered an integral part of a complete examination. Limited examinations for reoccurring indications may be performed as noted.  Summary:  A vascular evaluation was performed. The right middle cerebral artery was studied. An  IV was inserted into the patient's right forearm. Verbal informed consent was obtained.  No HITS at rest or with Valsalva. No apparent PFO. Negative TCD Bubble study *See table(s) above for measurements and observations.  Diagnosing  physician: Antony Contras MD Electronically signed by Antony Contras MD on 01/13/2019 at 5:24:17 PM.    Final    Vas US Carotid  Result Date: 01/13/2019 Carotid Arterial Duplex Study Indications:       CVA. Risk Factors:      Hypertension. Other Factors:     Right anterior cerebral artery stenosis vs emboli. Comparison Study:  no prior Performing Technologist: June Leap RDMS, RVT  Examination Guidelines: A complete evaluation includes B-mode imaging, spectral Doppler, color Doppler, and power Doppler as needed of all accessible portions of each vessel. Bilateral testing is considered an integral part of a complete examination. Limited examinations for reoccurring indications may be performed as noted.  Right Carotid Findings: +----------+--------+--------+--------+------------------+--------+           PSV cm/sEDV cm/sStenosisPlaque DescriptionComments +----------+--------+--------+--------+------------------+--------+ CCA Prox  85      14                                         +----------+--------+--------+--------+------------------+--------+ CCA Distal72      23                                         +----------+--------+--------+--------+------------------+--------+ ICA Prox  78      18      1-39%   heterogenous               +----------+--------+--------+--------+------------------+--------+ ICA Distal81      19                                         +----------+--------+--------+--------+------------------+--------+ ECA       72      14                                         +----------+--------+--------+--------+------------------+--------+ +----------+--------+-------+----------------+-------------------+           PSV cm/sEDV  cmsDescribe        Arm Pressure (mmHG) +----------+--------+-------+----------------+-------------------+ Subclavian101            Multiphasic, WNL                    +----------+--------+-------+----------------+-------------------+ +---------+--------+--+--------+--+---------+ VertebralPSV cm/s73EDV cm/s11Antegrade +---------+--------+--+--------+--+---------+  Left Carotid Findings: +----------+--------+--------+--------+------------------+--------+           PSV cm/sEDV cm/sStenosisPlaque DescriptionComments +----------+--------+--------+--------+------------------+--------+ CCA Prox  64      15                                         +----------+--------+--------+--------+------------------+--------+ CCA Distal68      17                                         +----------+--------+--------+--------+------------------+--------+ ICA Prox  76      19      1-39%   heterogenous               +----------+--------+--------+--------+------------------+--------+ ICA Distal72  23                                         +----------+--------+--------+--------+------------------+--------+ ECA       74      6                                          +----------+--------+--------+--------+------------------+--------+ +----------+--------+--------+----------------+-------------------+           PSV cm/sEDV cm/sDescribe        Arm Pressure (mmHG) +----------+--------+--------+----------------+-------------------+ [id number]              Multiphasic, WNL                    +----------+--------+--------+----------------+-------------------+ +---------+--------+--+--------+--+---------+ VertebralPSV cm/s70EDV cm/s20Antegrade +---------+--------+--+--------+--+---------+  Summary: Right Carotid: Velocities in the right ICA are consistent with a 1-39% stenosis. Left Carotid: Velocities in the left ICA are consistent with a 1-39% stenosis. Vertebrals:   Bilateral vertebral arteries demonstrate antegrade flow. Subclavians: Normal flow hemodynamics were seen in bilateral subclavian              arteries. *See table(s) above for measurements and observations.  Electronically signed by Antony Contras MD on 01/13/2019 at 1:16:03 PM.    Final     Labs:  Basic Metabolic Panel: Recent Labs  Lab 01/29/19 0636 02/01/19 0546 02/02/19 0537  NA 139 140 142  K 3.5 3.6 3.8  CL 107 109 109  CO2 22 24 22   GLUCOSE 147* 113* 107*  BUN 83* 42* 39*  CREATININE 2.84* 2.61* 2.73*  CALCIUM 8.4* 8.5* 8.5*  PHOS  --  3.1  --     CBC: Recent Labs  Lab 01/29/19 1244  01/31/19 1445 02/01/19 0546 02/02/19 0537  WBC 3.8*  --  5.4 4.4 3.7*  NEUTROABS 2.9  --   --   --  2.7  HGB 6.0*   < > 8.0* 7.0* 6.9*  HCT 16.3*   < > 21.7* 19.5* 19.9*  MCV 97.0  --  95.2 96.1 99.0  PLT 105*  --  116* 107* 109*   < > = values in this interval not displayed.    CBG: Recent Labs  Lab 02/02/19 2110 02/03/19 0640 02/03/19 1221 02/03/19 1632 02/03/19 2106  GLUCAP 144* 116* 170* 126* 153*   Family history.  Father with CAD as well as heart arrhythmia.  Brother with hypertension.  Denies any colon cancer or diabetes mellitus  Brief HPI:   Jeremy Grant is a 58 y.o. 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  Per chart review lives with spouse was independent prior to admission recently working at [employer] up until March 2020.  Presented 01/12/2019 with left leg weakness fall without loss of consciousness.  He initially did not go to the hospital.   Hemoglobin 8.8, creatinine 2.95, urine drug screen negative.  MRI of the brain showed patchy area of acute infarction in the deep white matter on  the right including the corpus callosum possible watershed type infarction.  Patient did not receive TPA.  MRI lumbar spine showed grade 1 anterior listhesis of L4 on L5 with mild central cord protrusion.  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  Echocardiogram with ejection fraction of 38% normal systolic function.  Carotid Dopplers with no ICA stenosis.  Neurology follow-up initially maintained on aspirin and Plavix for CVA prophylaxis x3 weeks then aspirin alone.  No plan for TEE or loop recorder.  Subcutaneous Lovenox added for DVT prophylaxis.  Patient was admitted for a comprehensive rehab program.   Hospital Course: MASASHI SNOWDON was admitted to rehab 01/15/2019 for inpatient therapies to consist of PT, ST and OT at least three hours five days a week. Past admission physiatrist, therapy team and rehab RN have worked together to provide customized collaborative inpatient rehab.  In regards to patient's patchy right corpus callosum infarction ACA territory felt to be embolic secondary to unknown source.  A 30-day cardiac event monitor was to be addressed as an outpatient.  Patient would follow-up neurology services.  Currently maintained on low-dose aspirin for CVA prophylaxis.  Patient with noted thrombocytopenia platelets 107-109 Plavix has been discontinued as well as Lovenox due to excessive bruising.  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  Patient with ongoing bouts of anemia he was transfused.  GI consulted underwent EGD 01/31/2019 showing no active bleeding there was some nonbleeding distal esophageal ulcer as well as  erosive gastropathy.  Recommendations were to repeat EGD in 2 months and was placed on Protonix.  Noted ongoing bouts of seizures left side post stroke neurology follow-up EEG was negative.  Patient maintained on Depakote titrated to 1000 mg every 12 hours as well as Keppra 500 mg twice daily.  Blood pressures controlled with the use of Norvasc 10 mg daily and would follow-up outpatient with PCP.  Lipitor ongoing for hyperlipidemia.  Noted spasticity regulated and controlled with low-dose Zanaflex.   Blood pressures were monitored on TID basis and remained controlled  Diabetes has been monitored with ac/hs CBG checks and SSI was use prn for tighter BS control.  Monitor while on prednisone  He/ is continent of bowel and bladder.  He/ has made gains during rehab stay and is attending therapies  He/She will continue to receive follow up therapies   after discharge  Rehab course: During patient's stay in rehab weekly team conferences were held to monitor patient's progress, set goals and discuss barriers to discharge. At admission, patient required minimal assist supine to sit supervision sit to supine.  Min mod assist ambulate 70 feet rolling walker.  Minimal assist grooming, minimal guard upper body bathing minimal guard upper body dressing.  Moderate assist toilet transfers.  He/  has had improvement in activity tolerance, balance, postural control as well as ability to compensate for deficits. He/ has had improvement in functional use RUE/LUE  and RLE/LLE as well as improvement in awareness.  Working with energy conservation techniques.  Ambulating 50 feet x 2 rolling walker minimal assist left AFO brace.  Worked on stair negotiation with use of bilateral upper extremity support on a single rail left AFO.  Worked on left knee control and weightbearing with the use of a left knee cage perform 215 lateral steps in place without upper extremity support.  Patient perform squat pivot transfers x4 working  on increase independence with step up and transfers.  Discussed the need for a tub bench which they were borrowing as well as a possible 3 in 1 commode.  Completed toss and catch with the use of a beach ball minimal guard assist.  Supervision for bathing in sitting position and standing with use of a grab bar for support.  You transferred out to the wheelchair at the sink with minimal assist using rolling walker where he completed grooming task.  Full family teaching completed plan discharge to home       Disposition: Discharge disposition: 01-Home or Self Care     Discharge to home   Diet: Soft  Special Instructions: No driving smoking or alcohol Follow-up with GI as outpatient recommendations repeat EGD in 2 months along with possible colonoscopy  Medications at discharge. 1 Tylenol as needed 2.  Norvasc 10 mg p.o. daily 3.  Aspirin 81 mg p.o. daily 4.  Lipitor 80 mg p.o. daily 5.  Imuran 50 mg p.o. daily 6.  Depakote 1000 mg p.o. every 12 hours 7.  Keppra 500 mg p.o. twice daily 8.  Prednisone 5 mg p.o. daily 9.  Zanaflex 2 mg p.o. daily  Discharge Instructions    Ambulatory referral to Neurology   Complete by: As directed    An appointment is requested in approximately 4 weeks right corpus callosum infarction   Ambulatory referral to Physical Medicine Rehab   Complete by: As directed    Moderate complexity follow-up 1 to 2 weeks right corpus callosum infarction     Allergies as of 02/04/2019      Reactions   Hydralazine Hcl       Medication List    STOP taking these medications   aspirin 325 MG tablet Replaced by: aspirin 81 MG EC tablet   clopidogrel 75 MG tablet Commonly known as: PLAVIX   cyclophosphamide 25 MG capsule Commonly known as: CYTOXAN     TAKE these medications   acetaminophen 325 MG tablet Commonly known as: TYLENOL Take 2 tablets (650 mg total) by mouth every 6 (six) hours as needed for moderate pain.   amLODipine 10 MG tablet Commonly  known as: NORVASC Take 1 tablet (10 mg total) by mouth daily.   aspirin 81 MG EC tablet Take 1 tablet (81 mg total) by mouth daily. Replaces: aspirin 325 MG tablet   atorvastatin 80 MG tablet Commonly known as: LIPITOR Take 1 tablet (80 mg total) by mouth daily at 6 PM.   azaTHIOprine 50 MG tablet Commonly known as: IMURAN Take 1 tablet (50 mg total) by mouth daily.   divalproex 500 MG DR tablet Commonly known as: DEPAKOTE Take 2 tablets (1,000 mg total) by mouth every 12 (twelve) hours.   levETIRAcetam 500 MG tablet Commonly known as: KEPPRA Take 1 tablet (500 mg total) by mouth 2 (two) times daily.   pantoprazole 40 MG tablet Commonly known as: PROTONIX Take 1 tablet (40 mg total) by mouth daily.   predniSONE 5 MG tablet Commonly known as: DELTASONE Take 1 tablet (5 mg total) by mouth daily with breakfast.   tiZANidine 2 MG tablet Commonly known as: ZANAFLEX Take 1 tablet (2 mg total) by mouth at bedtime.      Follow-up Information    Jamse Arn, MD Follow up.   Specialty: Physical Medicine and Rehabilitation Why: Office to call for appointment Contact information: [address] [place] [address] [phone]  Rosita Fire, MD Follow up.   Specialties: Nephrology, Internal Medicine Why: Call for appointment Contact information: [place] [address] [phone]        [facility] Follow up.   Specialty: Cardiology Why: our cardiology office will call you for a hart monitor to wear for 30 days to see if an arrythmia could have caused the stroke.   Contact information: [address] [place]       Laurence Spates, MD Follow up.   Specialty: Gastroenterology Why: Call for appointment Contact information: [address] [place] [address] [phone]           Signed: Cathlyn Parsons 02/04/2019,  5:29 AM Patient was seen, face-face, and physical exam performed by me on day of discharge, less than 30 minutes of total time spent.Delice Lesch, MD, ABPMR

## 2019-02-02 NOTE — Progress Notes (Signed)
Occupational Therapy Session Note  Patient Details  Name: Jeremy Grant MRN: [id number] Date of Birth: 1961/04/07  Today's Date: 02/02/2019 OT Individual Time: 3875-6433 OT Individual Time Calculation (min): 28 min   Short Term Goals: Week 2:  OT Short Term Goal 1 (Week 2): Continue working on established supervision level LTGs.  Skilled Therapeutic Interventions/Progress Updates:    RN seen before session due to critical hemoglobin value and okay'd OT session. No therapy restrictions. Pt greeted in bed with no c/o pain, wanting to get up in his w/c. Supine<sit completed with supervision. Able to don his Rt shoe but needed A for the Lt due to AFO component. Pt tied both shoelaces with supervision for sitting balance. Squat pivot<w/c completed with steady assist. Worked on standing balance during oral care, face washing, and hand washing at the sink. Noted offloading of Lt side. Worked on equal LE weightbearing in standing with manual facilitation and vcs for decreasing compensatory strategies of trunk. Vcs and assist for maintaining neutral base of support during stated tasks. Min-Mod A for balance overall. To continue working on equal LE weightbearing and Lt knee stability, had pt complete mini squats 10 reps 3 sets. Facilitation for increasing Lt weight shift and for fully extending Lt knee. Pt was then positioned at beside, agreeable to remain up in the w/c for his next OT session. Pt left with all needs within reach and safety belt fastened.    Therapy Documentation Precautions:  Precautions Precautions: Fall Precaution Comments: LLE paresis more severe than in the LUE Restrictions Weight Bearing Restrictions: No Vital Signs: Therapy Vitals Temp: 98.3 F (36.8 C) Temp Source: Oral Pulse Rate: 70 Resp: 19 BP: (!) 153/75 Patient Position (if appropriate): Sitting Oxygen Therapy SpO2: 100 % O2 Device: Room Air Pain:   ADL: ADL Eating: Independent Where Assessed-Eating:  Wheelchair Grooming: Supervision/safety Where Assessed-Grooming: Sitting at sink Upper Body Bathing: Setup Where Assessed-Upper Body Bathing: Wheelchair Lower Body Bathing: Moderate assistance Where Assessed-Lower Body Bathing: Shower Upper Body Dressing: Supervision/safety Where Assessed-Upper Body Dressing: Wheelchair Lower Body Dressing: Moderate assistance Where Assessed-Lower Body Dressing: Wheelchair Toileting: Moderate assistance Where Assessed-Toileting: Bedside Commode Toilet Transfer: Moderate assistance Toilet Transfer Method: Stand pivot Science writer: Geophysical data processor: Maximal Firefighter Method: Ambulating(no assistive device)      Therapy/Group: Individual Therapy  Shawnta Schlegel A Osiris Odriscoll 02/02/2019, 3:42 PM

## 2019-02-02 NOTE — Progress Notes (Signed)
Occupational Therapy Session Note  Patient Details  Name: Jeremy Grant MRN: [id number] Date of Birth: 20-Nov-1960  Today's Date: 02/02/2019 OT Individual Time: 1450-1532 OT Individual Time Calculation (min): 42 min    Short Term Goals: Week 2:  OT Short Term Goal 1 (Week 2): Continue working on established supervision level LTGs.  Skilled Therapeutic Interventions/Progress Updates:    Pt worked on shower/tub transfers and toilet transfers with use of the RW for support.  He completed these with min assist stand pivot with use of the RW for safety.  Discussed the need for a tub bench, which they are borrowing as well as possibly a 3:1, but will discuss with his spouse tomorrow.  Next, therapist took pt down to the therapy gym for work on trunk control and strengthening while sitting on the mat.  Completed toss and catch with use of the beach ball and min guard assist when it was tossed to the left side.  Had him reach with BUEs for the ball to the left both up and down.  LOB to the left multiple times with min assist needed to correct.  Finished session with transfer back to the room and stand pivot from the wheelchair to the bed.  Supervision for supine to sit.  Call button and phone in reach with safety bed alarm in place.    Therapy Documentation Precautions:  Precautions Precautions: Fall Precaution Comments: LLE paresis more severe than in the LUE Restrictions Weight Bearing Restrictions: No  Pain: Pain Assessment Pain Scale: Faces Faces Pain Scale: No hurt ADL: See Care Tool Section for some details of ADL  Therapy/Group: Individual Therapy  Doretha Goding OTR/L 02/02/2019, 4:21 PM

## 2019-02-02 NOTE — Progress Notes (Signed)
Physical Therapy Discharge Summary  Patient Details  Name: Jeremy Grant MRN: [id number] Date of Birth: 12/09/1960  Today's Date: 02/03/2019 PT Individual Time: 1301-1411 PT Individual Time Calculation (min): 70 min    Patient has met 10 of 11 long term goals due to improved activity tolerance, improved balance, improved postural control, increased strength, ability to compensate for deficits and improved awareness.  Patient to discharge at an ambulatory level [place].   Patient's care partner is independent to provide the necessary physical assistance at discharge, wife has performed gait, stair negotiation and car transfers with the patient. Patient is independent with bed<>chair squat pivot transfers.  Reasons goals not met: Pt did not meet stair negotiation goal as he requires min assist to navigate the stairs secondary to L LE weakness.   Recommendation:  Patient will benefit from ongoing skilled PT services in outpatient setting to continue to advance safe functional mobility, address ongoing impairments in balance, strength, functional mobility, and minimize fall risk.  Equipment: w/c  Reasons for discharge: treatment goals met  Patient/family agrees with progress made and goals achieved: Yes   PT Treatment Interventions: Pt supine in bed upon PT arrival, agreeable to therapy tx and denies pain. Pt transferred to sitting EOB independently and donned shoes, L AFO and L knee cage. Pt performed squat pivot to w/c mod I. Pt propelled w/c to the gym mod I x 150 ft using B UEs. Pt ambulated x 150 ft this session with RW and CGA, cues for increased R step length and shorted L step length in order to increased L stance time and achieve terminal hip extension during gait. Pt picked up object off floor x 1 with single UE support min assist, x 1 without UE support mod assist. Pt set up transfer and performed w/c parts management mod I to perform squat pivot to mat mod I. Reviewed HEP this  session and therapist provided handout, performed 2 x 10 each this session with cues for techniques: bridges, active assisted hip abduction, quad sets, and sit<>stands. Therapist provided continued education on L LE calf and hamstring stretching, performed both of these stretches this session. Pt transferred back to w/c mod I and propelled to the gym. Pt ambulated 2 x 80 ft this session working on gait, CGA with RW and L knee cage and AFO. Pt performed couch and recliner transfers with CGA using RW and ambulatory. Pt transported back to room and performed chair>bed transfer mod I. Sit>supine mod I, pt left with needs in reach and bed alarm set.   PT Discharge Precautions/Restrictions Precautions Precautions: Fall Precaution Comments: LLE paresis more severe than in the LUE Restrictions Weight Bearing Restrictions: No Vital Signs Therapy Vitals Temp: 97.6 F (36.4 C) Temp Source: Oral Pulse Rate: 67 Resp: 18 BP: (!) 150/81 Patient Position (if appropriate): Lying Oxygen Therapy SpO2: 99 % O2 Device: Room Air Cognition Overall Cognitive Status: Within Functional Limits for tasks assessed Arousal/Alertness: Awake/alert Orientation Level: Oriented X4 Attention: Sustained Sustained Attention: Appears intact Memory: Appears intact Awareness: Appears intact Safety/Judgment: Appears intact Sensation Sensation Light Touch: Impaired Detail Central sensation comments: diminished sensation L UE/LE Light Touch Impaired Details: Impaired LUE Proprioception: Impaired Detail Proprioception Impaired Details: Impaired LLE;Impaired LUE Coordination Gross Motor Movements are Fluid and Coordinated: No Fine Motor Movements are Fluid and Coordinated: No Coordination and Movement Description: coordination impaired secondary to L LE hemiparesis Heel Shin Test: unable to perform L LE Motor  Motor Motor: Hemiplegia;Abnormal postural alignment and control Motor -  Discharge Observations: L hemiplegia  (LLE more impaired than LUE)  Mobility Bed Mobility Bed Mobility: Sit to Supine;Supine to Sit Rolling Left: Independent Supine to Sit: Independent Sit to Supine: Independent Transfers Transfers: Sit to Stand;Stand to Sit;Stand Pivot Transfers;Squat Pivot Transfers Sit to Stand: Supervision/Verbal cueing Stand to Sit: Supervision/Verbal cueing Stand Pivot Transfers: Minimal Assistance - Patient > 75% Stand Pivot Transfer Details: Verbal cues for sequencing;Verbal cues for precautions/safety;Verbal cues for gait pattern;Manual facilitation for weight shifting Squat Pivot Transfers: Supervision/Verbal cueing Locomotion  Gait Ambulation: Yes Gait Assistance: Contact Guard/Touching assist Gait Distance (Feet): 150 Feet Assistive device: Rolling walker Gait Gait: Yes Gait Pattern: Impaired Gait Pattern: Decreased step length - right;Decreased stance time - left;Decreased weight shift to left;Left flexed knee in stance;Left genu recurvatum;Poor foot clearance - left Gait velocity: decreased Stairs / Additional Locomotion Stairs: Yes Stairs Assistance: Minimal Assistance - Patient > 75%;Contact Guard/Touching assist Stair Management Technique: Two rails;Step to pattern Number of Stairs: 12 Height of Stairs: 6 Wheelchair Mobility Wheelchair Mobility: Yes Wheelchair Assistance: Independent with Camera operator: Both upper extremities Wheelchair Parts Management: Independent Distance: 150 ft  Trunk/Postural Assessment  Cervical Assessment Cervical Assessment: Exceptions to WFL(cervical protraction) Thoracic Assessment Thoracic Assessment: Exceptions to WFL(thoracic kyphosis) Lumbar Assessment Lumbar Assessment: Exceptions to WFL(posterior pelvic tilt in sitting) Postural Control Postural Control: Deficits on evaluation Trunk Control: decreased in standing without UE support Righting Reactions: delayed  Balance Balance Balance Assessed: Yes Static Sitting  Balance Static Sitting - Balance Support: Feet supported Static Sitting - Level of Assistance: 6: Modified independent (Device/Increase time) Dynamic Sitting Balance Dynamic Sitting - Balance Support: During functional activity;Feet supported Dynamic Sitting - Level of Assistance: 6: Modified independent (Device/Increase time) Static Standing Balance Static Standing - Balance Support: Bilateral upper extremity supported Static Standing - Level of Assistance: 5: Stand by assistance Dynamic Standing Balance Dynamic Standing - Balance Support: Bilateral upper extremity supported Dynamic Standing - Level of Assistance: 4: Min assist Extremity Assessment  RLE Assessment RLE Assessment: Within Functional Limits LLE Assessment LLE Assessment: Exceptions to [facility] Passive Range of Motion (PROM) Comments: hamstring tightness, DF limited to neutral General Strength Comments: strength impaired as detailed below LLE Strength Left Hip Flexion: 3/5 Left Hip ABduction: 2+/5 Left Hip ADduction: 2+/5 Left Knee Flexion: 2-/5 Left Knee Extension: 2-/5 Left Ankle Dorsiflexion: 0/5 Left Ankle Plantar Flexion: 0/5    Netta Corrigan, PT, DPT 02/02/2019, 8:40 PM

## 2019-02-02 NOTE — Progress Notes (Signed)
Occupational Therapy Session Note  Patient Details  Name: Jeremy Grant MRN: [id number] Date of Birth: 1961-02-22  Today's Date: 02/02/2019 OT Individual Time: 1828-8337 OT Individual Time Calculation (min): 45 min    Short Term Goals: Week 3:  OT Short Term Goal 1 (Week 3): Continue working on established supervision level LTGs.  Skilled Therapeutic Interventions/Progress Updates:    Pt worked on bathing, dressing, and grooming tasks during session.  He was able to complete transfer from supine to sit EOB with supervision to start session.  He then completed functional mobility to the shower with min assist and without use of the AFO but with use of the RW for support.  Supervision for bathing in sitting and standing with use of the grab bar for support with sit to stand and standing balance.  He transferred out to the wheelchair at the sink with min assist using the RW where he completed grooming tasks of brushing his hair and his teeth at modified independent level from the wheelchair.  He then donned his socks and shoes with overall min assist to finish the session.  Pt left with call button and phone in reach and safety alarm belt in place.    Therapy Documentation Precautions:  Precautions Precautions: Fall Precaution Comments: LLE paresis more severe than in the LUE Restrictions Weight Bearing Restrictions: No  Pain: Pain Assessment Pain Scale: Faces Pain Score: 0-No pain ADL: See Care Tool Section for details of mobility and selfcare  Therapy/Group: Individual Therapy  Jill Ruppe OTR/L 02/02/2019, 10:48 AM

## 2019-02-02 NOTE — Progress Notes (Signed)
CRITICAL VALUE ALERT  Critical Value: Hemoglobin 6.9  Date & Time Notied:  625 9/21 Provider Notified:Dan angiulli Orders Received/Actions taken: no order received for now.

## 2019-02-03 ENCOUNTER — Encounter (HOSPITAL_COMMUNITY): Payer: BC Managed Care – PPO | Admitting: Occupational Therapy

## 2019-02-03 ENCOUNTER — Inpatient Hospital Stay (HOSPITAL_COMMUNITY): Payer: BC Managed Care – PPO | Admitting: Rehabilitation

## 2019-02-03 ENCOUNTER — Ambulatory Visit (HOSPITAL_COMMUNITY): Payer: BC Managed Care – PPO

## 2019-02-03 DIAGNOSIS — I150 Renovascular hypertension: Secondary | ICD-10-CM

## 2019-02-03 LAB — VALPROIC ACID LEVEL: Valproic Acid Lvl: 98 ug/mL (ref 50.0–100.0)

## 2019-02-03 LAB — GLUCOSE, CAPILLARY
Glucose-Capillary: 116 mg/dL — ABNORMAL HIGH (ref 70–99)
Glucose-Capillary: 170 mg/dL — ABNORMAL HIGH (ref 70–99)
Glucose-Capillary: 126 mg/dL — ABNORMAL HIGH (ref 70–99)
Glucose-Capillary: 153 mg/dL — ABNORMAL HIGH (ref 70–99)

## 2019-02-03 LAB — OCCULT BLOOD X 1 CARD TO LAB, STOOL: Fecal Occult Bld: NEGATIVE

## 2019-02-03 LAB — SURGICAL PATHOLOGY

## 2019-02-03 MED ORDER — AZATHIOPRINE 50 MG PO TABS: 50.0000 mg | ORAL_TABLET | Freq: Every day | ORAL | 1 refills | Status: AC

## 2019-02-03 MED ORDER — ACETAMINOPHEN 325 MG PO TABS: 650.0000 mg | ORAL_TABLET | Freq: Four times a day (QID) | ORAL | Status: DC | PRN

## 2019-02-03 MED ORDER — ATORVASTATIN CALCIUM 80 MG PO TABS: 80.0000 mg | ORAL_TABLET | Freq: Every day | ORAL | 0 refills | Status: DC

## 2019-02-03 MED ORDER — DIVALPROEX SODIUM 500 MG PO DR TAB: 1000.0000 mg | DELAYED_RELEASE_TABLET | Freq: Two times a day (BID) | ORAL | 1 refills | Status: DC

## 2019-02-03 MED ORDER — ASPIRIN 81 MG PO TBEC: 81.0000 mg | DELAYED_RELEASE_TABLET | Freq: Every day | ORAL | Status: AC

## 2019-02-03 MED ORDER — LEVETIRACETAM 500 MG PO TABS: 500.0000 mg | ORAL_TABLET | Freq: Two times a day (BID) | ORAL | 1 refills | Status: DC

## 2019-02-03 MED ORDER — TIZANIDINE HCL 2 MG PO TABS: 2.0000 mg | ORAL_TABLET | Freq: Every day | ORAL | 0 refills | Status: DC

## 2019-02-03 MED ORDER — PANTOPRAZOLE SODIUM 40 MG PO TBEC: 40.0000 mg | DELAYED_RELEASE_TABLET | Freq: Every day | ORAL | 0 refills | Status: DC

## 2019-02-03 MED ORDER — AMLODIPINE BESYLATE 10 MG PO TABS: 10.0000 mg | ORAL_TABLET | Freq: Every day | ORAL | 1 refills | Status: DC

## 2019-02-03 MED ORDER — PREDNISONE 5 MG PO TABS: 5.0000 mg | ORAL_TABLET | Freq: Every day | ORAL | 0 refills | Status: AC

## 2019-02-03 NOTE — Progress Notes (Signed)
Occupational Therapy Discharge Summary  Patient Details  Name: Jeremy Grant MRN: [id number] Date of Birth: June 06, 1960  Today's Date: 02/03/2019 OT Individual Time: 1101-1200 OT Individual Time Calculation (min): 59 min    Session Note:  Pt completed shower and dressing during session with spouse assisting.  He needed min assist for functional mobility with use of the RW and no AFO to the shower.  He completed showering with contact guard assist sitting on the shower seat, with one LOB to the left when sitting.  She was able to supervision him throughout and help assist with all transfers into and out of the shower.  He completed dressing sit to stand from the wheelchair with min assist.  Min facilitation needed for crossing the LLE over the right knee and maintaining it for dressing tasks.  He also needed min instructional cueing for scooting to the edge of the chair and for positioning the LLE.  She also assisted him with tub/shower transfer in the apartment with min assist.  Educated pt and spouse on adjusting the shower bench as well as positioning of the curtain when he is in the shower.  Returned to the room with brief education and demonstration on shoulder exercises for home as well as FM and gross motor coordination tasks.  Handouts provided as well.  Finished session with pt in the wheelchair and spouse present.  She has been checked off to assist pt with transfers to the bathroom as well as to the bed.    Patient has met 10 of 10 long term goals due to improved activity tolerance, improved balance, postural control, ability to compensate for deficits, functional use of  LEFT upper and LEFT lower extremity and improved coordination.  Patient to discharge at [facility] Assist level.  Patient's care partner is independent to provide the necessary physical assistance at discharge.    Reasons goals not met: NA  Recommendation:  Patient will benefit from ongoing skilled OT services in  outpatient setting to continue to advance functional skills in the area of BADL, iADL, Vocation and Reduce care partner burden.  Pt still demonstrates increased LLE weakness with decreased dynamic standing balance.  He also continues to exhibit decreased shoulder and scapular strengthening in the LUE for functional use of the LUE as well as decreased trunk strengthening on the left side as well.  Recommend continued outpatient OT to address these deficits in order to reach modified independent level or greater.  Equipment: No equipment provided  Reasons for discharge: treatment goals met and discharge from hospital  Patient/family agrees with progress made and goals achieved: Yes  OT Discharge Precautions/Restrictions  Precautions Precautions: Fall Precaution Comments: LLE paresis more severe than in the LUE Restrictions Weight Bearing Restrictions: No Pain Pain Assessment Pain Scale: Faces Pain Score: 0-No pain Faces Pain Scale: No hurt ADL ADL Eating: Independent Where Assessed-Eating: Wheelchair Grooming: Modified independent Where Assessed-Grooming: Wheelchair Upper Body Bathing: Setup Where Assessed-Upper Body Bathing: Wheelchair Lower Body Bathing: Contact guard Where Assessed-Lower Body Bathing: Shower Upper Body Dressing: Independent Where Assessed-Upper Body Dressing: Wheelchair Lower Body Dressing: Minimal assistance Where Assessed-Lower Body Dressing: Wheelchair Toileting: Contact guard Where Assessed-Toileting: Glass blower/designer: Psychiatric nurse Method: Arts development officer: Radiographer, therapeutic: Metallurgist Method: Optometrist: Facilities manager: Environmental education officer Method: Heritage manager: Radio broadcast assistant Vision Baseline Vision/History: Wears glasses Wears Glasses: At all times Patient  Visual Report:  No change from baseline Vision Assessment?: Yes Ocular Range of Motion: Within Functional Limits Tracking/Visual Pursuits: Able to track stimulus in all quads without difficulty Saccades: Within functional limits Convergence: Within functional limits Visual Fields: No apparent deficits Perception  Perception: Impaired Inattention/Neglect: Does not attend to left side of body(Decreased integration of the left hand at times) Praxis Praxis: Intact Cognition Overall Cognitive Status: Within Functional Limits for tasks assessed Arousal/Alertness: Awake/alert Orientation Level: Oriented X4 Attention: Sustained Sustained Attention: Appears intact Problem Solving: Appears intact Safety/Judgment: Appears intact Sensation Sensation Light Touch: Appears Intact Hot/Cold: Appears Intact Proprioception: Appears Intact Stereognosis: Appears Intact Additional Comments: Pt demonstrates light touch throughout the LUE WFLS. Coordination Gross Motor Movements are Fluid and Coordinated: No Fine Motor Movements are Fluid and Coordinated: No Coordination and Movement Description: Slight gross motor coordination deficit noted in the LUE as well as slight FM coordination impairment when compare to the RUE. Motor  Motor Motor: Hemiplegia Motor - Discharge Observations: L hemiplegia (LLE more impaired than LUE) Mobility  Bed Mobility Bed Mobility: Sit to Supine;Supine to Sit Rolling Left: Independent Supine to Sit: Independent Sit to Supine: Independent Transfers Sit to Stand: Contact Guard/Touching assist Stand to Sit: Contact Guard/Touching assist  Trunk/Postural Assessment  Cervical Assessment Cervical Assessment: Exceptions to WFL(cervical flexion) Thoracic Assessment Thoracic Assessment: Exceptions to WFL(thoracic rounding) Lumbar Assessment Lumbar Assessment: Exceptions to WFL(posterior pelvic tilt)  Balance Balance Balance Assessed: Yes Static Sitting Balance Static  Sitting - Balance Support: Feet supported Static Sitting - Level of Assistance: 6: Modified independent (Device/Increase time) Dynamic Sitting Balance Dynamic Sitting - Balance Support: During functional activity Dynamic Sitting - Level of Assistance: 5: Stand by assistance Static Standing Balance Static Standing - Balance Support: During functional activity Static Standing - Level of Assistance: 5: Stand by assistance Dynamic Standing Balance Dynamic Standing - Balance Support: During functional activity Dynamic Standing - Level of Assistance: 4: Min assist Extremity/Trunk Assessment RUE Assessment RUE Assessment: Within Functional Limits LUE Assessment LUE Assessment: Exceptions to [facility] Active Range of Motion (AROM) Comments: AROM for elbow and hand WFLS with slight FM coordination deficit compared to the right with speed, but very minor.  Increased left shoulder weakness noted with shoulder flexion at 0-130 degrees with decreased ability to maintain elbow extension through the movement.  Scapular weakness also noted in the LUE with decreased AROM for adduction and elevation   Casmere Hollenbeck OTR/L 02/03/2019, 12:49 PM

## 2019-02-03 NOTE — Progress Notes (Signed)
[facility] PHYSICAL MEDICINE & REHABILITATION PROGRESS NOTE   Subjective/Complaints: Patient seen laying in bed this morning.  He states he slept well overnight.  He denies complaints.  He is looking forward to discharge tomorrow.  ROS: Denies CP, SOB, N/V/D  Objective:   No results found. Recent Labs    02/01/19 0546 02/02/19 0537  WBC 4.4 3.7*  HGB 7.0* 6.9*  HCT 19.5* 19.9*  PLT 107* 109*   Recent Labs    02/01/19 0546 02/02/19 0537  NA 140 142  K 3.6 3.8  CL 109 109  CO2 24 22  GLUCOSE 113* 107*  BUN 42* 39*  CREATININE 2.61* 2.73*  CALCIUM 8.5* 8.5*    Intake/Output Summary (Last 24 hours) at 02/03/2019 0834 Last data filed at 02/03/2019 0746 Gross per 24 hour  Intake 800 ml  Output 1175 ml  Net -375 ml     Physical Exam: Vital Signs Blood pressure (!) 147/89, pulse 73, temperature 97.8 F (36.6 C), temperature source Oral, resp. rate 19, height 6\' 1"  (1.854 m), weight 62.1 kg, SpO2 99 %. Constitutional: No distress . Vital signs reviewed. HENT: Normocephalic.  Atraumatic. Eyes: EOMI. No discharge. Cardiovascular: No JVD. Respiratory: Normal effort.  No stridor. GI: Non-distended. Skin: Healing ecchymosis Psych: Flat. Musc: No edema in extremities.  No tenderness in extremities. Motor: Alert RLE: 4/5 proximal and distal RUE: 4/5 proximal distal LUE: 4/5 proximal distal LLE: 4/5 hip flexion, knee extension, 0/5 ankle dorsiflexion  Assessment/Plan: 1. 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  Physiatrist is providing close team supervision and 24 hour management of active medical problems listed below.  Physiatrist and rehab team continue to assess barriers to discharge/monitor patient progress toward functional and medical goals  Care Tool:  Bathing  Bathing activity did not occur: Refused Body parts bathed by patient: Right arm,  Left arm, Chest, Abdomen, Right upper leg, Left upper leg, Right lower leg, Left lower leg, Face, Front perineal area, Buttocks   Body parts bathed by helper: Front perineal area, Buttocks     Bathing assist Assist Level: Supervision/Verbal cueing     Upper Body Dressing/Undressing Upper body dressing   What is the patient wearing?: Pull over shirt    Upper body assist Assist Level: Set up assist    Lower Body Dressing/Undressing Lower body dressing      What is the patient wearing?: Pants     Lower body assist Assist for lower body dressing: Contact Guard/Touching assist     Toileting Toileting    Toileting assist Assist for toileting: Independent with assistive device Assistive Device Comment: urinal   Transfers Chair/bed transfer  Transfers assist     Chair/bed transfer assist level: Supervision/Verbal cueing(squat pivot)     Locomotion Ambulation   Ambulation assist      Assist level: Minimal Assistance - Patient > 75% Assistive device: Walker-rolling Max distance: 50 ft   Walk 10 feet activity   Assist     Assist level: Minimal Assistance - Patient > 75% Assistive device: Walker-rolling, Orthosis(AFO and knee cage)   Walk 50 feet activity   Assist    Assist level: Minimal Assistance - Patient > 75% Assistive device: Walker-rolling, Orthosis    Walk 150 feet activity   Assist Walk 150 feet activity did not occur: Safety/medical concerns  Assist level: Minimal Assistance - Patient > 75% Assistive device: Walker-rolling    Walk 10  feet on uneven surface  activity   Assist Walk 10 feet on uneven surfaces activity did not occur: Safety/medical concerns         Wheelchair     Assist Will patient use wheelchair at discharge?: Yes Type of Wheelchair: Manual    Wheelchair assist level: Set up assist, Supervision/Verbal cueing Max wheelchair distance: 161ft    Wheelchair 50 feet with 2 turns activity    Assist         Assist Level: Set up assist, Supervision/Verbal cueing   Wheelchair 150 feet activity     Assist      Assist Level: Supervision/Verbal cueing, Set up assist   Blood pressure (!) 147/89, pulse 73, temperature 97.8 F (36.6 C), temperature source Oral, resp. rate 19, height 6\' 1"  (1.854 m), weight 62.1 kg, SpO2 99 %.    Medical Problem List and Plan: 1.  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  Plan 30-day cardiac event monitor as outpatient tent d/c 9/23-As per nephrology note.  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  This is unlikely to improve at this point  Continue CIR, plan for discharge tomorrow 2.  Antithrombotics: -DVT/anticoagulation:  plt on low side with bruising off  enoxaparin             -antiplatelet therapy: After discussion with neuro, Plavix DC'd, continue ASA 81  3. Pain Management: Tylenol as needed 4. Mood: Provide emotional support             -antipsychotic agents: N/A 5. Neuropsych: This patient is capable of making decisions on his own behalf. 6. Skin/Wound Care: Routine skin checks 7. Fluids/Electrolytes/Nutrition: Routine I/Os 8.  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  Monitor for any signs of fluid overload Filed Weights   02/01/19 0446 02/02/19 0500 02/03/19 0500  Weight: 63.5 kg 63.3 kg 62.1 kg   Stable on 9/22 9.  Hypertension.    Vitals:   02/02/19 1949 02/03/19 0408  BP: (!) 150/81 (!) 147/89  Pulse: 67 73  Resp: 18 19  Temp: 97.6 F (36.4 C) 97.8 F (36.6 C)  SpO2: 99% 99%   Amlodipine 5mg , increased to 10 on 9/20  Slightly elevated on 9/22 10.  ANCA necrotizing concentric glomerulonephritis.  off  Cytoxan , on  chronic prednisone.  Follow-up per renal services.  Admission creatinine of 2.95  -Imuran per nephro  Creatinine 2.73 on  9/21 11.  Pancytopenia/chronic anemia.  Follow-up labs patient had been receiving Aranesp as an outpatient with renal services.  Has had several transfusions  Hemoglobin 6.9 on 9/22, no further recs per GI-we will follow-up as outpatient  Continue to monitor  12.  Hyperlipidemia.  Lipitor 13.  History of alcohol use.  Counseling and monitor. 14.  Spasticity  Tizanidine nightly 15. Diarrhea/Bowel issues/Nausea  Zofran prn 4 mg q8 hours prn and monitor  Improved 17.  Seizure left-sided post stroke.    Per Neuro rec changed seizure prophyllaxis to Valproate 750mg  BID with Keppra 500mg  BID.  Had a 1000mg  IV valproate load    Negative  EEG.-As discussed with neurology will get a stat level for valproate and if on the low side may need to increase valproic acid dose.  The patient is also not having generalized seizures but short duration partial seizures which should be relatively well-tolerated.  If the valproic acid level is  on the high side of the normal range, may need to add Vimpat  VPA level low increase to 1000mg  BID, given IV VPA bolus of 500mg  , repeat VPA low but pt seizure free  VPA level ordered 18.  Anemia- initially thought to be related to renal failure as well as pancytopenis, but also has fecal oocult + stool,   EGD performed on 9/19, showing nonbleeding distal esophageal ulcer with erosive gastropathy, recommend repeat EGD in 2 months along with colonoscopy  PPI 40 mg daily  See #11 19.  Steroid-induced hyperglycemia  Wife prefers to check CBGs, "just to know", but does not want correctional insulin.  Patient in agreement  Slightly labile, but relatively controlled on 9/22 20.  Thrombocytopenia  Platelets 109 on 9/21  LOS: 19 days A FACE TO FACE EVALUATION WAS PERFORMED  Marli Diego Lorie Phenix 02/03/2019, 8:34 AM

## 2019-02-03 NOTE — Progress Notes (Signed)
Physical Therapy Session Note  Patient Details  Name: Jeremy Grant MRN: [id number] Date of Birth: [birth year]  Today's Date: 02/03/2019 PT Individual Time: 4098-1191 PT Individual Time Calculation (min): 71 min   Short Term Goals: Week 1:  PT Short Term Goal 1 (Week 1): Pt will be able to perform bed mobility with CGA PT Short Term Goal 1 - Progress (Week 1): Met PT Short Term Goal 2 (Week 1): Pt will be able to perform functional transfers with CGA PT Short Term Goal 2 - Progress (Week 1): Progressing toward goal PT Short Term Goal 3 (Week 1): Pt will be able to gait x 100' with min assist PT Short Term Goal 3 - Progress (Week 1): Met PT Short Term Goal 4 (Week 1): Pt will be able to perform 4 stairs with rails with CGA PT Short Term Goal 4 - Progress (Week 1): Progressing toward goal Week 2:  PT Short Term Goal 1 (Week 2): = to LTGs based on ELOS PT Short Term Goal 1 - Progress (Week 2): Progressing toward goal  Skilled Therapeutic Interventions/Progress Updates:   Pt received lying in bed, agreeable to therapy session.  Pt verbalized that wife was coming during session for training.  Pt performed bed mobility from flat hospital bed with cues to perform without rails.  Pt unable to problem solve performing without use of rail, therefore provided cue for rolling and using UEs to lift trunk.  He was then able to do at S level (cues) and then at mod I level in ADL apt.  PT donned AFO/knee cage for time management.  Educated pt that if he is unable to don himself, then he would need to have wife do before she goes to work.  Began ambulating in hallway with walker at [place] level.  Pt with two instances of L toe drag, needing min A for safety to recover.  Wife then joined session and remainder of session focused on family education/training for D/C tomorrow.  Performed car transfer to simulated van height at S level with cues for hand placement and safety.  Pt needs min cues when turning to ensure  proper placement of LLE when turning.  Assisted pt to ADL apt and performed w/c<>bed transfer (squat pivot) at S level with min cues for foot placement.  Performed all aspects of bed mobility at independent level and transferred back to w/c at S level, again min cues for foot placement.  Pt then moved around apartment in w/c at S level to get to recliner.  Transferred to/from recliner at min/guard level (from [place]) and S level (to recliner) with max cues for safety and foot placement and forward weight shift.  Assisted to main therapy gym and focused on stair training.  Set up w/c so that he would have to walk distance he will at home (per video that wife showed PT) to stairs.  Ambulated with PT first at [facility] level to stairs.  Instructed pt/wife on getting pt to hold rails while she placed RW at top of stairs, then came back to assist.  PT allowed pt to use R hand on rail and LUE around PTs shoulders while ascending/descending.  Pt able to do safely with min cues for getting LLE all the way onto step.  Min A on stairs and returned to w/c at [facility] level with cues for safe turning.  Then had wife demonstrate this as well with cues on staggering feet on stairs, communication between pt and wife,  cues to provide to pt.  Pt/wife did well during session.  Then performed single 4" step with RW as he has 3 STE but then one more single step to get into house.  Pt self propelled w/c back to room x 150' at S level with RLE, RUE and intermittent use of LUE with cues for more adequate propulsion technique.  Pt reporting needing to use restroom and that RN wants to collect stool sample, therefore set up bedside commode to catch stool and wife assisted into restroom and provided min/guard while pt managed clothing.  Educated to call RN when done for safety.  Pt with wife present.    Therapy Documentation Precautions:  Precautions Precautions: Fall Precaution Comments: LLE paresis more severe than in the  LUE Restrictions Weight Bearing Restrictions: No   Pain: Pain Assessment Pain Scale: 0-10 Pain Score: 0-No pain    Therapy/Group: Individual Therapy  Denice Bors 02/03/2019, 9:42 AM

## 2019-02-04 ENCOUNTER — Encounter: Payer: BC Managed Care – PPO | Admitting: Occupational Therapy

## 2019-02-04 LAB — CBC
HCT: 21.5 % — ABNORMAL LOW (ref 39.0–52.0)
Hemoglobin: 7.7 g/dL — ABNORMAL LOW (ref 13.0–17.0)
MCH: 35.2 pg — ABNORMAL HIGH (ref 26.0–34.0)
MCHC: 35.8 g/dL (ref 30.0–36.0)
MCV: 98.2 fL (ref 80.0–100.0)
Platelets: 104 10*3/uL — ABNORMAL LOW (ref 150–400)
RBC: 2.19 MIL/uL — ABNORMAL LOW (ref 4.22–5.81)
RDW: 18.3 % — ABNORMAL HIGH (ref 11.5–15.5)
WBC: 3.6 10*3/uL — ABNORMAL LOW (ref 4.0–10.5)
nRBC: 0 % (ref 0.0–0.2)

## 2019-02-04 LAB — GLUCOSE, CAPILLARY: Glucose-Capillary: 94 mg/dL (ref 70–99)

## 2019-02-04 NOTE — Progress Notes (Signed)
Patient was discharged from 30M09.  Patient left floor via wheelchair escorted by nursing staff.  Patient verbalized understanding of discharge instructions as given by Algis Liming, PA.  All patient belongings sent with patient including DME and prescriptions.  Patient appears to be in no immediate distress at this time.    Brita Romp, RN

## 2019-02-04 NOTE — Plan of Care (Signed)
  Problem: RH BOWEL ELIMINATION Goal: RH STG MANAGE BOWEL WITH ASSISTANCE Description: STG Manage Bowel with min Assistance. 02/04/2019 1220 by Brita Romp, RN Outcome: Completed/Met 02/04/2019 1220 by Brita Romp, RN Reactivated Goal: RH STG MANAGE BOWEL W/MEDICATION W/ASSISTANCE Description: STG Manage Bowel with Medication with min Assistance. 02/04/2019 1220 by Brita Romp, RN Outcome: Completed/Met 02/04/2019 1220 by Brita Romp, RN Reactivated   Problem: RH BLADDER ELIMINATION Goal: RH STG MANAGE BLADDER WITH ASSISTANCE Description: STG Manage Bladder With min Assistance 02/04/2019 1220 by Brita Romp, RN Outcome: Completed/Met 02/04/2019 1220 by Brita Romp, RN Reactivated Goal: RH STG MANAGE BLADDER WITH MEDICATION WITH ASSISTANCE Description: STG Manage Bladder With Medication With Assistance. 02/04/2019 1220 by Brita Romp, RN Outcome: Completed/Met 02/04/2019 1220 by Brita Romp, RN Reactivated Goal: RH STG MANAGE BLADDER WITH EQUIPMENT WITH ASSISTANCE Description: STG Manage Bladder With Equipment With Assistance 02/04/2019 1220 by Brita Romp, RN Outcome: Completed/Met 02/04/2019 1220 by Brita Romp, RN Reactivated   Problem: RH SKIN INTEGRITY Goal: RH STG SKIN FREE OF INFECTION/BREAKDOWN Description: With min assist 02/04/2019 1220 by Brita Romp, RN Outcome: Completed/Met 02/04/2019 1220 by Brita Romp, RN Reactivated Goal: RH STG MAINTAIN SKIN INTEGRITY WITH ASSISTANCE Description: STG Maintain Skin Integrity With min Assistance. 02/04/2019 1220 by Brita Romp, RN Outcome: Completed/Met 02/04/2019 1220 by Brita Romp, RN Reactivated Goal: RH STG ABLE TO PERFORM INCISION/WOUND CARE W/ASSISTANCE Description: STG Able To Perform Incision/Wound Care With Assistance. 02/04/2019 1220 by Brita Romp, RN Outcome: Completed/Met 02/04/2019 1220 by Brita Romp,  RN Reactivated   Problem: RH SAFETY Goal: RH STG ADHERE TO SAFETY PRECAUTIONS W/ASSISTANCE/DEVICE Description: STG Adhere to Safety Precautions With min Assistance/Device. 02/04/2019 1220 by Brita Romp, RN Outcome: Completed/Met 02/04/2019 1220 by Brita Romp, RN Reactivated Goal: RH STG DECREASED RISK OF FALL WITH ASSISTANCE Description: STG Decreased Risk of Fall With min  Assistance. 02/04/2019 1220 by Brita Romp, RN Outcome: Completed/Met 02/04/2019 1220 by Brita Romp, RN Reactivated   Problem: RH PAIN MANAGEMENT Goal: RH STG PAIN MANAGED AT OR BELOW PT'S PAIN GOAL Description: Less than 3  02/04/2019 1220 by Brita Romp, RN Outcome: Completed/Met 02/04/2019 1220 by Brita Romp, RN Reactivated

## 2019-02-04 NOTE — Progress Notes (Signed)
[facility] PHYSICAL MEDICINE & REHABILITATION PROGRESS NOTE   Subjective/Complaints: Patient seen laying in bed this morning.  He states he slept well overnight.  He states he is ready for discharge.  ROS: Denies CP, SOB, N/V/D  Objective:   No results found. Recent Labs    02/02/19 0537  WBC 3.7*  HGB 6.9*  HCT 19.9*  PLT 109*   Recent Labs    02/02/19 0537  NA 142  K 3.8  CL 109  CO2 22  GLUCOSE 107*  BUN 39*  CREATININE 2.73*  CALCIUM 8.5*    Intake/Output Summary (Last 24 hours) at 02/04/2019 0854 Last data filed at 02/04/2019 0803 Gross per 24 hour  Intake 600 ml  Output 1800 ml  Net -1200 ml     Physical Exam: Vital Signs Blood pressure (!) 166/94, pulse 70, temperature 97.9 F (36.6 C), temperature source Oral, resp. rate 18, height 6\' 1"  (1.854 m), weight 63.9 kg, SpO2 99 %. Constitutional: No distress . Vital signs reviewed.  Frail. HENT: Normocephalic.  Atraumatic. Eyes: EOMI. No discharge. Cardiovascular: No JVD. Respiratory: Normal effort.  No stridor. GI: Non-distended. Skin: Warm and dry.  Intact. Psych: Flat. Musc: No edema in extremities.  No tenderness in extremities. Motor: Alert RLE: 4/5 proximal and distal RUE: 4/5 proximal distal LUE: 4/5 proximal distal LLE: 4/5 hip flexion, knee extension, 0/5 ankle dorsiflexion, unchanged  Assessment/Plan: 1. 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  Physiatrist is providing close team supervision and 24 hour management of active medical problems listed below.  Physiatrist and rehab team continue to assess barriers to discharge/monitor patient progress toward functional and medical goals  Care Tool:  Bathing  Bathing activity did not occur: Refused Body parts bathed by patient: Right arm, Left arm, Chest, Abdomen, Right upper leg, Left upper leg, Right lower leg, Left lower leg,  Face, Front perineal area, Buttocks   Body parts bathed by helper: Front perineal area, Buttocks     Bathing assist Assist Level: Supervision/Verbal cueing     Upper Body Dressing/Undressing Upper body dressing   What is the patient wearing?: Pull over shirt    Upper body assist Assist Level: Independent    Lower Body Dressing/Undressing Lower body dressing      What is the patient wearing?: Pants     Lower body assist Assist for lower body dressing: Contact Guard/Touching assist     Toileting Toileting    Toileting assist Assist for toileting: Contact Guard/Touching assist Assistive Device Comment: urinal   Transfers Chair/bed transfer  Transfers assist     Chair/bed transfer assist level: Independent with assistive device(from w/c<>chair squat pivot) Chair/bed transfer assistive device: Armrests   Locomotion Ambulation   Ambulation assist      Assist level: Contact Guard/Touching assist Assistive device: Walker-rolling Max distance: 150 ft   Walk 10 feet activity   Assist     Assist level: Contact Guard/Touching assist Assistive device: Walker-rolling, Orthosis   Walk 50 feet activity   Assist    Assist level: Contact Guard/Touching assist Assistive device: Walker-rolling, Orthosis    Walk 150 feet activity   Assist Walk 150 feet activity did not occur: Safety/medical concerns  Assist level: Contact Guard/Touching assist Assistive device: Walker-rolling    Walk 10 feet on uneven surface  activity   Assist Walk 10 feet on uneven surfaces activity did not occur: Safety/medical concerns   Assist level:  Minimal Assistance - Patient > 75%     Wheelchair     Assist Will patient use wheelchair at discharge?: Yes Type of Wheelchair: Manual    Wheelchair assist level: Independent Max wheelchair distance: 150    Wheelchair 50 feet with 2 turns activity    Assist        Assist Level: Independent   Wheelchair 150  feet activity     Assist      Assist Level: Independent   Blood pressure (!) 166/94, pulse 70, temperature 97.9 F (36.6 C), temperature source Oral, resp. rate 18, height 6\' 1"  (1.854 m), weight 63.9 kg, SpO2 99 %.    Medical Problem List and Plan: 1.  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  Plan 30-day cardiac event monitor as outpatient tent d/c 9/23-As per nephrology note.  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  This is unlikely to improve at this point  DC today  Will see patient for transitional care management in 1-2 weeks post-discharge 2.  Antithrombotics: -DVT/anticoagulation:  plt on low side with bruising off  enoxaparin             -antiplatelet therapy: After discussion with neuro, Plavix DC'd, continue ASA 81  3. Pain Management: Tylenol as needed 4. Mood: Provide emotional support             -antipsychotic agents: N/A 5. Neuropsych: This patient is capable of making decisions on his own behalf. 6. Skin/Wound Care: Routine skin checks 7. Fluids/Electrolytes/Nutrition: Routine I/Os 8.  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  Monitor for any signs of fluid overload Filed Weights   02/02/19 0500 02/03/19 0500 02/04/19 0317  Weight: 63.3 kg 62.1 kg 63.9 kg   Stable on 9/23 9.  Hypertension.    Vitals:   02/03/19 2013 02/04/19 0317  BP: (!) 152/87 (!) 166/94  Pulse: 71 70  Resp: 18 18  Temp: 97.7 F (36.5 C) 97.9 F (36.6 C)  SpO2: 100% 99%   Amlodipine 5mg , increased to 10 on 9/20  Elevated on 9/23, will need ambulatory monitoring with further adjustments. 10.  ANCA necrotizing concentric glomerulonephritis.  off  Cytoxan , on  chronic prednisone.  Follow-up per renal services.  Admission creatinine of 2.95  -Imuran per nephro  Creatinine 2.73 on 9/21 11.   Pancytopenia/chronic anemia.  Follow-up labs patient had been receiving Aranesp as an outpatient with renal services.  Has had several transfusions  Hemoglobin 7.7 on 9/23, no further recs per GI-will follow-up as outpatient  Continue to monitor  12.  Hyperlipidemia.  Lipitor 13.  History of alcohol use.  Counseling and monitor. 14.  Spasticity  Tizanidine nightly 15. Diarrhea/Bowel issues/Nausea  Zofran prn 4 mg q8 hours prn and monitor  Improved 17.  Seizure left-sided post stroke.    Per Neuro rec changed seizure prophyllaxis to Valproate 750mg  BID with Keppra 500mg  BID.  Had a 1000mg  IV valproate load    Negative  EEG.-As discussed with neurology will get a stat level for valproate and if on the low side may need to increase valproic acid dose.  The patient is also not having generalized seizures but short duration partial seizures which should be relatively well-tolerated.  If the valproic acid level is on the high side of the normal range, may need to add Vimpat  VPA level low increase  to 1000mg  BID, given IV VPA bolus of 500mg  , repeat VPA low but pt seizure free  VPA level within normal limits 18.  Anemia- initially thought to be related to renal failure as well as pancytopenis, but also has fecal oocult + stool,   EGD performed on 9/19, showing nonbleeding distal esophageal ulcer with erosive gastropathy, recommend repeat EGD in 2 months along with colonoscopy  PPI 40 mg daily  See #11 19.  Steroid-induced hyperglycemia  Wife prefers to check CBGs, "just to know", but does not want correctional insulin.  Patient in agreement  Labile on 9/23 20.  Thrombocytopenia  Platelets 104 on 9/23  LOS: 20 days A FACE TO FACE EVALUATION WAS PERFORMED  Jeremy Grant Lorie Phenix 02/04/2019, 8:54 AM

## 2019-02-05 LAB — THIOPURINE METHYLTRANSFERASE (TPMT), RBC: TPMT Activity:: 49.2 Units/mL RBC

## 2019-02-06 ENCOUNTER — Telehealth: Payer: Self-pay | Admitting: *Deleted

## 2019-02-06 NOTE — Telephone Encounter (Signed)
Transitional care call completed, appointment confirmed, address confirmed, new patient packet  Mailed  Transitional Care Questions   Questions for our staff to ask patients on Transitional care 48 hour phone call:   1. Are you/is patient experiencing any problems since coming home? Wife says its been somewhat scary  For this to happen to her husband and now transitioning to care at home. Rehab will be straight to outpatient bypassing any home care. Some difficulty maneuvering into the household.  It is a rambler but has various steps up or down.  Are there any questions regarding any aspect of care?  No  2. Are there any questions regarding medications administration/dosing? No  Are meds being taken as prescribed? Yes Patient should review meds with caller to confirm   3. Have there been any falls? No  4. Has Home Health been to the house and/or have they contacted you? No Home Health, straight to O/P If not, have you tried to contact them? Can we help you contact them?   5. Are bowels and bladder emptying properly? Yes Are there any unexpected incontinence issues? If applicable, is patient following bowel/bladder programs?   6. Any fevers, problems with breathing, unexpected pain? No  7. Are there any skin problems or new areas of breakdown? No  8. Has the patient/family member arranged specialty MD follow up (ie cardiology/neurology/renal/surgical/etc)? Yes Can we help arrange?  No  9. Does the patient need any other services or support that we can help arrange? No  10. Are caregivers following through as expected in assisting the patient? es, slightly overwhelmed.  11. Has the patient quit smoking, drinking alcohol, or using drugs as recommended? No Drink, No Smoke, No Illicit drug use

## 2019-02-09 ENCOUNTER — Ambulatory Visit: Payer: BC Managed Care – PPO | Admitting: Occupational Therapy

## 2019-02-12 ENCOUNTER — Ambulatory Visit: Payer: BC Managed Care – PPO | Admitting: Physical Therapy

## 2019-02-16 ENCOUNTER — Other Ambulatory Visit: Payer: Self-pay

## 2019-02-16 ENCOUNTER — Encounter (HOSPITAL_COMMUNITY)
Admission: RE | Admit: 2019-02-16 | Discharge: 2019-02-16 | Disposition: A | Discharge status: Home / Self Care | Payer: BC Managed Care – PPO | Source: Ambulatory Visit | Attending: Nephrology | Admitting: Nephrology

## 2019-02-16 VITALS — BP 169/90 | HR 87 | Resp 20

## 2019-02-16 DIAGNOSIS — N179 Acute kidney failure, unspecified: Secondary | ICD-10-CM | POA: Diagnosis not present

## 2019-02-16 LAB — IRON AND TIBC
Iron: 43 ug/dL — ABNORMAL LOW (ref 45–182)
Saturation Ratios: 14 % — ABNORMAL LOW (ref 17.9–39.5)
TIBC: 298 ug/dL (ref 250–450)
UIBC: 255 ug/dL

## 2019-02-16 LAB — FERRITIN: Ferritin: 544 ng/mL — ABNORMAL HIGH (ref 24–336)

## 2019-02-16 LAB — POCT HEMOGLOBIN-HEMACUE: Hemoglobin: 11.1 g/dL — ABNORMAL LOW (ref 13.0–17.0)

## 2019-02-16 MED ORDER — EPOETIN ALFA-EPBX 10000 UNIT/ML IJ SOLN
20000.0000 [IU] | INTRAMUSCULAR | Status: DC
  Administered 2019-02-16: 20000 [IU] via SUBCUTANEOUS
  Filled 2019-02-16: qty 2

## 2019-02-17 ENCOUNTER — Other Ambulatory Visit: Payer: Self-pay

## 2019-02-17 ENCOUNTER — Encounter: Payer: Self-pay | Admitting: Registered Nurse

## 2019-02-17 ENCOUNTER — Encounter: Payer: BC Managed Care – PPO | Attending: Registered Nurse | Admitting: Registered Nurse

## 2019-02-17 VITALS — BP 158/78 | HR 74 | Temp 97.5°F

## 2019-02-17 DIAGNOSIS — I5042 Chronic combined systolic (congestive) and diastolic (congestive) heart failure: Secondary | ICD-10-CM

## 2019-02-17 DIAGNOSIS — G8110 Spastic hemiplegia affecting unspecified side: Secondary | ICD-10-CM

## 2019-02-17 DIAGNOSIS — I6389 Other cerebral infarction: Secondary | ICD-10-CM | POA: Diagnosis present

## 2019-02-17 DIAGNOSIS — R569 Unspecified convulsions: Secondary | ICD-10-CM

## 2019-02-17 DIAGNOSIS — I10 Essential (primary) hypertension: Secondary | ICD-10-CM | POA: Diagnosis not present

## 2019-02-17 MED ORDER — TIZANIDINE HCL 2 MG PO TABS: 2.0000 mg | ORAL_TABLET | Freq: Every day | ORAL | 1 refills | Status: DC

## 2019-02-17 MED ORDER — DIVALPROEX SODIUM 500 MG PO DR TAB: 1000.0000 mg | DELAYED_RELEASE_TABLET | Freq: Two times a day (BID) | ORAL | 1 refills | Status: DC

## 2019-02-17 MED ORDER — PANTOPRAZOLE SODIUM 40 MG PO TBEC: 40.0000 mg | DELAYED_RELEASE_TABLET | Freq: Every day | ORAL | 0 refills | Status: DC

## 2019-02-17 MED ORDER — ATORVASTATIN CALCIUM 80 MG PO TABS: 80.0000 mg | ORAL_TABLET | Freq: Every day | ORAL | 0 refills | Status: AC

## 2019-02-17 NOTE — Progress Notes (Signed)
Subjective:    Patient ID: Jeremy Grant, male    DOB: 09/24/1960, 58 y.o.   MRN: [id number]  HPI: Jeremy Grant is a 58 y.o. 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 Presented to [facility] on 01/12/2019 with left leg weakness and fall. Neurology consulted.   MR Brain WO Contrast: IMPRESSION: Patchy areas of acute infarct in the deep white matter on the right including the corpus callosum. Possible watershed type infarct.  Chronic microvascular ischemic changes in the white matter.  MR Lumbar Spine:WO Contrast: IMPRESSION: 1. Grade 1 anterolisthesis of L4 on L5. 2. Lumbar spine spondylosis most notable at L4-L5 with a central disc protrusion contacting the bilateral descending L5 nerve roots and large bulky right-sided osteophytes which causes effacement of the thecal sac. There is moderate bilateral neural foraminal narrowing and mild central canal stenosis at this level. 3. Also at L5-S1 there is a central disc protrusion with annular fissure and the disc protrusion contacts the right S1 nerve root.  Currently maintained on low-dose ASA for CVA prophylaxis.   Jeremy Grant was admitted to Inpatient rehabilitation on 01/15/2019 and discharged home on 02/04/2019. Reports when he was walking into his home his foot became lodged on the step, had a near mis-fall he didn't fall on the ground. Neighbor was able to brace his fall. He wasn't able to start Physical Therapy as scheduled due to needing a ramp. His church built him a ramp, this provider called [facility] and he is scheduled for PT evaluation, the verbalizes understanding.  Jeremy Grant reports left foot pain. He rates his pain 2. He is walking with walker in his home. Also reports he has a good  appetite.   Wife in room all questions answered.    Pain Inventory Average Pain 5 Pain Right Now 2 My pain is intermittent and aching  In the last 24 hours, has pain interfered with the following? General activity 0 Relation with others 0 Enjoyment of life 0 What TIME of day is your pain at its worst? varies Sleep (in general) Poor  Pain is worse with: walking and some activites Pain improves with: medication Relief from Meds: 2  Mobility walk with assistance use a walker use a wheelchair needs help with transfers  Function I need assistance with the following:  dressing, bathing, toileting, meal prep and household duties Do you have any goals in this area?  yes  Neuro/Psych weakness numbness trouble walking spasms anxiety  Prior Studies transitional  Physicians involved in your care transitional   Family History  Problem Relation Age of Onset  . Heart attack Father   . Heart disease Father   . Arrhythmia Father   . Hypertension Brother   . Hypertension Brother    Social History   Socioeconomic History  . Marital status: Married    Spouse name: BRENDA  . Number of children: 0  . Years of education: 45  . Highest education level: 12th grade  Occupational History  . Occupation: Medical sales representative  Social Needs  . Financial resource strain: Not on file  . Food insecurity    Worry: Never true    Inability: Never true  . Transportation needs    Medical: No    Non-medical: No  Tobacco Use  . Smoking status: Never Smoker  . Smokeless  tobacco: Never Used  Substance and Sexual Activity  . Alcohol use: Yes    Alcohol/week: 3.0 standard drinks    Types: 3 Glasses of wine per week  . Drug use: No  . Sexual activity: Not Currently    Birth control/protection: None  Lifestyle  . Physical activity    Days per week: 5 days    Minutes per session: 30 min  . Stress: Not at all  Relationships  . Social connections    Talks on phone: More than three times  a week    Gets together: Twice a week    Attends religious service: Never    Active member of club or organization: No    Attends meetings of clubs or organizations: Never    Relationship status: Married  Other Topics Concern  . Not on file  Social History Narrative  . Not on file   Past Surgical History:  Procedure Laterality Date  . BIOPSY  01/31/2019   Procedure: BIOPSY;  Surgeon: Otis Brace, MD;  Location: [place] [facility];  Service: Gastroenterology;;  . ESOPHAGOGASTRODUODENOSCOPY N/A 01/31/2019   Procedure: ESOPHAGOGASTRODUODENOSCOPY (EGD);  Surgeon: Otis Brace, MD;  Location[facility] ENDOSCOPY;  Service: Gastroenterology;  Laterality: N/A;  . IR FLUORO GUIDE CV LINE RIGHT  07/25/2018  . IR US GUIDE VASC ACCESS RIGHT  07/25/2018  . LEFT AND RIGHT HEART CATHETERIZATION WITH CORONARY ANGIOGRAM N/A 11/05/2013   Procedure: LEFT AND RIGHT HEART CATHETERIZATION WITH CORONARY ANGIOGRAM;  Surgeon: Peter M Martinique, MD;  Location: [facility] C[facility] LAB;  Service: Cardiovascular;  Laterality: N/A;  . RIGHT HEART CATHETERIZATION N/A 01/14/2014   Procedure: RIGHT HEART CATH;  Surgeon: Larey Dresser, MD;  Location: [facility] CATH LAB;  Service: Cardiovascular;  Laterality: N/A;   Past Medical History:  Diagnosis Date  . Arthritis   . CHF (congestive heart failure) ([place])   . Combined systolic and diastolic cardiac dysfunction    Echo 11/03/2013 EF 16%, grade 3 diastolic dysfunction  . Hypertension   . NICM (nonischemic cardiomyopathy) ([place])    a. L/RHC (11/05/13): RA: 3, RV 52/5, PA 49/19 (31), PCWP 10, AO 166/93, PA 67%, Fick CO/CI: 5.71/2.97, Lmain: normal, LAD: large, without signficant dz, first diagonal has 20% dz at ostium, LCx: normal, RCA: 30% stenosis at the bifurcation of PDA and PLOM   BP (!) 171/84   Pulse 85   Temp (!) 97.5 F (36.4 C)   SpO2 96%   Opioid Risk Score:   Fall Risk Score:  `1  Depression screen PHQ 2/9  No flowsheet data found.  Review of Systems  Constitutional:  Negative.   HENT: Negative.   Eyes: Negative.   Respiratory: Negative.   Cardiovascular: Negative.   Gastrointestinal: Negative.   Endocrine: Negative.   Genitourinary: Negative.   Musculoskeletal: Positive for gait problem.       Spasms   Allergic/Immunologic: Negative.   Neurological: Positive for weakness and numbness.  Hematological: Negative.   Psychiatric/Behavioral: Negative.   All other systems reviewed and are negative.      Objective:   Physical Exam Vitals signs and nursing note reviewed.  Constitutional:      Appearance: Normal appearance.  Neck:     Musculoskeletal: Normal range of motion and neck supple.  Cardiovascular:     Rate and Rhythm: Normal rate and regular rhythm.     Pulses: Normal pulses.     Heart sounds: Normal heart sounds.  Pulmonary:     Effort: Pulmonary effort is normal.  Breath sounds: Normal breath sounds.  Musculoskeletal:     Comments: Normal Muscle Bulk and Muscle Testing Reveals:  Upper Extremities: Full ROM and Muscle Strength 5/5 Lower Extremities: Lower Extremities: Right: Full ROM  And Muscle Strength 5/5 Left: Decreased ROM: Unable to Extend Lower Extremity Wearing AFO Arrived in wheelchair  Skin:    General: Skin is warm and dry.  Neurological:     Mental Status: He is alert and oriented to person, place, and time.  Psychiatric:        Mood and Affect: Mood normal.        Behavior: Behavior normal.           Assessment & Plan:  1. Acute unilateral cerebral infarction in a watershed distribution/ Spastic hemiparesis: He will be [facility].  Continue current medication regimen. Has a scheduled HFU appointment with Neurology.  2. Essential hypertension: Continue current medication regimen. PCP Following.  3. Chronic combined systolic and diastolic CHF.Cardiology Following. Continue to Monitor.  4. Seizures: Continue current medication regimen. Has a scheduled appointment  with Neurology.   30 minutes of face to face patient care time was spent during this visit. All questions were encouraged and answered.  F/U with Dr Posey Pronto in 4- 6 weeks

## 2019-02-18 ENCOUNTER — Ambulatory Visit: Payer: BC Managed Care – PPO | Attending: Physical Medicine & Rehabilitation

## 2019-02-18 ENCOUNTER — Other Ambulatory Visit: Payer: Self-pay

## 2019-02-18 VITALS — BP 158/88 | HR 80

## 2019-02-18 DIAGNOSIS — M6281 Muscle weakness (generalized): Secondary | ICD-10-CM

## 2019-02-18 DIAGNOSIS — I69354 Hemiplegia and hemiparesis following cerebral infarction affecting left non-dominant side: Secondary | ICD-10-CM | POA: Diagnosis present

## 2019-02-18 DIAGNOSIS — R2689 Other abnormalities of gait and mobility: Secondary | ICD-10-CM

## 2019-02-18 NOTE — Therapy (Signed)
[facility] [address] [place], [place], [address] Phone: [phone]   Fax:  [phone]  Physical Therapy Evaluation  Patient Details  Name: Jeremy Grant MRN: [id number] Date of Birth: July 24, 1960 Referring Provider (PT): Dr. Letta Pate   Encounter Date: 02/18/2019  PT End of Session - 02/18/19 1549    Visit Number  1    Number of Visits  16    Date for PT Re-Evaluation  05/14/19    Authorization Type  BCBS    PT Start Time  0045    PT Stop Time  1545    PT Time Calculation (min)  60 min    Equipment Utilized During Treatment  Gait belt   pt had his own gait belt, wore left AFO and sweedish knee cage of his   Activity Tolerance  Patient tolerated treatment well    Behavior During Therapy  [facility] for tasks assessed/performed       Past Medical History:  Diagnosis Date  . Arthritis   . CHF (congestive heart failure) ([place])   . Combined systolic and diastolic cardiac dysfunction    Echo 11/03/2013 EF 99%, grade 3 diastolic dysfunction  . Hypertension   . NICM (nonischemic cardiomyopathy) ([place])    a. L/RHC (11/05/13): RA: 3, RV 52/5, PA 49/19 (31), PCWP 10, AO 166/93, PA 67%, Fick CO/CI: 5.71/2.97, Lmain: normal, LAD: large, without signficant dz, first diagonal has 20% dz at ostium, LCx: normal, RCA: 30% stenosis at the bifurcation of PDA and PLOM    Past Surgical History:  Procedure Laterality Date  . BIOPSY  01/31/2019   Procedure: BIOPSY;  Surgeon: Otis Brace, MD;  Location: [facility]COPY;  Service: Gastroenterology;;  . ESOPHAGOGASTRODUODENOSCOPY N/A 01/31/2019   Procedure: ESOPHAGOGASTRODUODENOSCOPY (EGD);  Surgeon: Otis Brace, MD;  Location: [facility] ENDOSCOPY;  Service: Gastroenterology;  Laterality: N/A;  . IR FLUORO GUIDE CV LINE RIGHT  07/25/2018  . IR US GUIDE VASC ACCESS RIGHT  07/25/2018  . LEFT AND RIGHT HEART CATHETERIZATION WITH CORONARY ANGIOGRAM N/A 11/05/2013   Procedure: LEFT AND RIGHT  HEART CATHETERIZATION WITH CORONARY ANGIOGRAM;  Surgeon: Peter M Martinique, MD;  Location: [facility] CATH LAB;  Service: Cardiovascular;  Laterality: N/A;  . RIGHT HEART CATHETERIZATION N/A 01/14/2014   Procedure: RIGHT HEART CATH;  Surgeon: Larey Dresser, MD;  Location: [facility] CATH LAB;  Service: Cardiovascular;  Laterality: N/A;    Vitals:   02/18/19 1449  BP: (!) 158/88  Pulse: 80     Subjective Assessment - 02/18/19 1449    Subjective  Multiple areas of infarct including R.Corpus callosum infarct, subacute and chronic left frontal MCA infarcts. 8/31-9/3 then inpatient rehab 9/3-9/23. Pt also had seizures and was anemic at hospital. Pt reports he had shingles in May and was using cane for a little while but had been doing better until CVA. Pt was independent prior. Pt was on leave of absence from work for 6 months prior due to kidney issues. He was working full time at [employer] in [place] prior on feet a lot.  Was supposed to start back but CVA occured. Reports 3 falls after after having shingles when lost balance. Since his CVA he is walking some with FWW but using wheelchair for longer distances. Also has left AFO and sweedish knee cage.    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 8/31-9/3 then inpatient rehab 9/3-9/23.PMH: CHF, HTN, hyperlipidemia, glomerulonephritis, pancytopenia/chronic anemia, seizure  left-sided post stroke    Patient Stated Goals  Pt wants to be able to walk again.    Currently in Pain?  Yes   foot. Pain 6/10 at worst recently and seems worse at night.   Pain Score  0-No pain    Pain Location  Ankle    Pain Orientation  Left;Upper    Pain Descriptors / Indicators  Throbbing;Spasm    Pain Type  Acute pain    Pain Onset  1 to 4 weeks ago    Pain Frequency  Intermittent         [facility] PT Assessment - 02/18/19 0001      Assessment   Medical Diagnosis  CVA with seizures     Referring Provider (PT)  Dr. Letta Pate    Onset Date/Surgical Date  01/12/19    Hand Dominance  Right    Prior Therapy  Pt had inpatient rehab with ST, OT, PT      Precautions   Precautions  Fall      Balance Screen   Has the patient fallen in the past 6 months  Yes    How many times?  3    Has the patient had a decrease in activity level because of a fear of falling?   Yes    Is the patient reluctant to leave their home because of a fear of falling?   No      Home Film/video editor residence    Living Arrangements  Spouse/significant other    Available Help at Discharge  Family    Type of [place]  One level    Home Equipment  Wheelchair - manual;Walker - 2 wheels;Walker - 4 wheels;Tub bench;Bedside commode;Cane - single point      Prior Function   Level of Independence  Independent    Vocation  Full time employment    Vocation Requirements  on feet as worked at [employer] in [place]   has not worked since March 5th due to kidney failure.   Leisure  fishing, working in garden      Cognition   Overall Cognitive Status  Within Functional Limits for tasks assessed      Observation/Other Assessments-Edema    Edema  --   left ankle and foot +2 edema     Sensation   Light Touch  Appears Intact      ROM / Strength   AROM / PROM / Strength  Strength      Strength   Overall Strength Comments  Left UE shoulder flexion 3+/5, left elbow flex/ext 4/5, grip on left slightly decreased from right.    Strength Assessment Site  Hip;Knee;Ankle    Right/Left Hip  Right;Left    Right Hip Flexion  5/5    Left Hip Flexion  4/5    Left Hip ABduction  2-/5    Right/Left Knee  Right;Left    Right Knee Flexion  5/5    Right Knee Extension  5/5    Left Knee Flexion  2-/5    Left Knee Extension  2-/5    Right/Left Ankle  Right;Left    Right Ankle Dorsiflexion  5/5    Left Ankle Dorsiflexion  1/5      Bed  Mobility   Bed Mobility  Sit to Supine    Supine to Sit  Independent with assistive device  assists left LE with hands, poor control for leg coming off    Sit to Supine  Independent with assistive device   assists left LE with hands     Transfers   Transfers  Sit to Stand;Stand to Sit    Sit to Stand  4: Min guard   from low mat and from w/c     Ambulation/Gait   Ambulation/Gait  Yes    Ambulation/Gait Assistance  4: Min assist    Ambulation Distance (Feet)  75 Feet    Assistive device  Rolling walker   left AFO and left sweedish knee cage   Gait Pattern  Decreased step length - left;Decreased step length - right;Poor foot clearance - left;Left genu recurvatum;Step-through pattern    Ambulation Surface  Level;Indoor    Gait velocity  0.76ms    Gait Comments  Pt initially ambulated with just AFO and had left knee recurvatum so donned sweedish knee cage and much improved with no recurvatum noted. Pt reports that sweedish knee cage was hurting him the other day at lateral knee but did better today. Advised to monitor for swelling and placement.. Wife followed with wheelchair.      Standardized Balance Assessment   Standardized Balance Assessment  Timed Up and Go Test      Timed Up and Go Test   TUG  Normal TUG    Normal TUG (seconds)  28    TUG Comments  with left AFO and sweedish knee cage                Objective measurements completed on examination: See above findings.              PT Education - 02/18/19 1641    Education Details  Pt and wife educated on PT plan of care. Instructed to elevate left leg more when sitting to help with swelling in ankle/foot and to monitor skin at knee if sweedish knee cage bothering him. Gave tennis balls for walker.    Person(s) Educated  Patient;Spouse    Methods  Explanation    Comprehension  Verbalized understanding       PT Short Term Goals - 02/18/19 1700      PT SHORT TERM GOAL #1   Title  Pt will be able to  perform initial HEP with assist of wife to continue strength, balance and mobility gains on own.    Time  4    Period  Weeks    Status  New    Target Date  03/20/19      PT SHORT TERM GOAL #2   Title  Pt will ambulate 100' with FWW CGA for improved household mobility.    Time  4    Period  Weeks    Status  New    Target Date  03/20/19      PT SHORT TERM GOAL #3   Title  Pt will be able to maintain standing with minimal UE support x 5 min for improved balance and to assist with ADLs.    Time  4    Period  Weeks    Status  New    Target Date  03/20/19      PT SHORT TERM GOAL #4   Title  Pt will be independent with transfers from varied surfaces for improved mobility at home.    Time  4    Period  Weeks    Status  New    Target Date  03/20/19        PT Long Term Goals - 02/18/19 1706      PT LONG TERM GOAL #1   Title  Pt will be able to ambulate >500' with FWW independently for short community distances.    Time  7    Period  Weeks    Status  New    Target Date  04/08/19      PT LONG TERM GOAL #2   Title  Pt will decrease TUG from 28 sec to <18 sec for improved balance and decreased fall risk.    Baseline  28 sec on 02/18/19    Time  7    Period  Weeks    Status  New    Target Date  04/08/19      PT LONG TERM GOAL #3   Title  Pt will increase gait speed from from 0.36ms to >0.8 m/s for improved gait safety.    Baseline  0.543m on 02/18/19    Time  7    Period  Weeks    Status  New    Target Date  04/08/19      PT LONG TERM GOAL #4   Title  Pt will be independent with progressive HEP for strength, balance and gait to continue gains on own.    Time  7    Period  Weeks    Status  New    Target Date  04/08/19             Plan - 02/18/19 1645    Clinical Impression Statement  Pt presents to PT following CVA, seizures and anemia. Pt with left hemiparesis with LE more involved than UE. Currently needs left AFO and sweedish knee cage due to weakness. Pt is  fall risk based on TUG of 28 sec and has slow gait speed of 0.5354m Pt will benefit from skilled PT to address strength, balance and functional mobility deficits to improve function in home and community.    Personal Factors and Comorbidities  Comorbidity 3+    Examination-Activity Limitations  Stairs;Stand;Squat;Locomotion Level;Transfers;Bed Mobility    Examination-Participation Restrictions  Community Activity;Shop;Driving;Cleaning;Yard Work    StaMerchant navy officervolving/Moderate complexity    Clinical Decision Making  Moderate    Rehab Potential  Good    PT Frequency  Other (comment)   3x/week x 1 week followed by 2x/week for 6 weeks   PT Duration  Other (comment)   7 weeks   PT Treatment/Interventions  Electrical Stimulation;DME Instruction;ADLs/Self Care Home Management;Gait training;Stair training;Functional mobility training;Therapeutic activities;Therapeutic exercise;Balance training;Orthotic Fit/Training;Patient/family education;Neuromuscular re-education;Wheelchair mobility training;Passive range of motion    PT Next Visit Plan  See how sweedish knee cage feeling. Gait training with FWW, AFO and knee cage. Start left LE strengthening HEP, standing balance    Recommended Other Services  OT already ordered    Consulted and Agree with Plan of Care  Patient;Family member/caregiver    Family Member Consulted  wife       Patient will benefit from skilled therapeutic intervention in order to improve the following deficits and impairments:  Abnormal gait, Difficulty walking, Decreased balance, Decreased mobility, Decreased strength, Decreased knowledge of use of DME, Decreased endurance, Decreased activity tolerance, Decreased range of motion, Pain, Increased muscle spasms  Visit Diagnosis: Other abnormalities of gait and mobility  Hemiplegia and hemiparesis following cerebral infarction affecting left non-dominant side (HCC)  Muscle weakness  (generalized)     Problem List Patient Active Problem List  Diagnosis Date Noted  . Acute blood loss anemia   . Steroid-induced hyperglycemia   . Acute on chronic anemia   . Seizures ([place])   . Spastic hemiparesis ([place])   . 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 Hypertension   . Thrombocytopenia ([place])   . Acute unilateral cerebral infarction in a watershed distribution [facility]) 01/15/2019  . Weakness   . CVA (cerebral vascular accident) ([place]) 01/12/2019  . Malnutrition of moderate degree 07/31/2018  . Elevated serum protein level   . Multiple myeloma ([place])   . Pancytopenia ([place])   . Normocytic normochromic anemia 07/16/2018  . Acute kidney failure ([place]) 07/16/2018  . NICM (nonischemic cardiomyopathy) ([place]) 11/12/2013  . History of ETOH abuse 11/12/2013  . Chronic combined systolic and diastolic CHF (congestive heart failure) ([place]) 11/12/2013  . Congestive dilated cardiomyopathy ([place]) 11/04/2013  . Malignant hypertension     Electa Sniff, PT, DPT, NCS 02/18/2019, 5:15 PM  [facility] [address] [place], [place], [address] Phone: [phone]   Fax:  [phone]  Name: Jeremy Grant MRN: [id number] Date of Birth: 1961/05/05

## 2019-02-23 ENCOUNTER — Ambulatory Visit: Payer: BC Managed Care – PPO

## 2019-02-23 ENCOUNTER — Other Ambulatory Visit: Payer: Self-pay

## 2019-02-23 DIAGNOSIS — M6281 Muscle weakness (generalized): Secondary | ICD-10-CM

## 2019-02-23 DIAGNOSIS — R2689 Other abnormalities of gait and mobility: Secondary | ICD-10-CM | POA: Diagnosis not present

## 2019-02-23 DIAGNOSIS — I69354 Hemiplegia and hemiparesis following cerebral infarction affecting left non-dominant side: Secondary | ICD-10-CM

## 2019-02-23 NOTE — Therapy (Signed)
[facility] [address] [place], [place], [address] Phone: [phone]   Fax:  [phone]  Physical Therapy Treatment  Patient Details  Name: Jeremy Grant MRN: [id number] Date of Birth: 01-Sep-1960 Referring Provider (PT): Dr. Letta Pate   Encounter Date: 02/23/2019  PT End of Session - 02/23/19 0927    Visit Number  2    Number of Visits  16    Date for PT Re-Evaluation  05/14/19    Authorization Type  BCBS    PT Start Time  0926    PT Stop Time  1014    PT Time Calculation (min)  48 min    Equipment Utilized During Treatment  Gait belt   pt had his own gait belt, wore left AFO and sweedish knee cage of his   Activity Tolerance  Patient tolerated treatment well    Behavior During Therapy  [facility] for tasks assessed/performed       Past Medical History:  Diagnosis Date  . Arthritis   . CHF (congestive heart failure) ([place])   . Combined systolic and diastolic cardiac dysfunction    Echo 11/03/2013 EF 65%, grade 3 diastolic dysfunction  . Hypertension   . NICM (nonischemic cardiomyopathy) ([place])    a. L/RHC (11/05/13): RA: 3, RV 52/5, PA 49/19 (31), PCWP 10, AO 166/93, PA 67%, Fick CO/CI: 5.71/2.97, Lmain: normal, LAD: large, without signficant dz, first diagonal has 20% dz at ostium, LCx: normal, RCA: 30% stenosis at the bifurcation of PDA and PLOM    Past Surgical History:  Procedure Laterality Date  . BIOPSY  01/31/2019   Procedure: BIOPSY;  Surgeon: Otis Brace, MD;  Location: [facility];  Service: Gastroenterology;;  . ESOPHAGOGASTRODUODENOSCOPY N/A 01/31/2019   Procedure: ESOPHAGOGASTRODUODENOSCOPY (EGD);  Surgeon: Otis Brace, MD;  Location: [facility] ENDOSCOPY;  Service: Gastroenterology;  Laterality: N/A;  . IR FLUORO GUIDE CV LINE RIGHT  07/25/2018  . IR US GUIDE VASC ACCESS RIGHT  07/25/2018  . LEFT AND RIGHT HEART CATHETERIZATION WITH CORONARY ANGIOGRAM N/A 11/05/2013   Procedure: LEFT AND RIGHT  HEART CATHETERIZATION WITH CORONARY ANGIOGRAM;  Surgeon: Peter M Martinique, MD;  Location: [facility] CATH LAB;  Service: Cardiovascular;  Laterality: N/A;  . RIGHT HEART CATHETERIZATION N/A 01/14/2014   Procedure: RIGHT HEART CATH;  Surgeon: Larey Dresser, MD;  Location: [facility] CATH LAB;  Service: Cardiovascular;  Laterality: N/A;    There were no vitals filed for this visit.  Subjective Assessment - 02/23/19 0928    Subjective  Pt reports weekend went well. He was able to do some walking around the house with no pain from brace.    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 8/31-9/3 then inpatient rehab 9/3-9/23.PMH: CHF, HTN, hyperlipidemia, glomerulonephritis, pancytopenia/chronic anemia, seizure left-sided post stroke    Patient Stated Goals  Pt wants to be able to walk again.    Currently in Pain?  No/denies    Pain Onset  1 to 4 weeks ago                       [facility] Adult PT Treatment/Exercise - 02/23/19 0001      Transfers   Transfers  Sit to Stand;Stand to Sit    Sit to Stand  5: Supervision    Sit to Stand Details  Verbal cues for technique    Comments  Pt instructed to push from surface he is rising  from versus pulling on walker. Pt uses momentum to pop up quickly with sit to stand.      Ambulation/Gait   Ambulation/Gait  Yes    Ambulation/Gait Assistance  4: Min guard;4: Min assist    Ambulation Distance (Feet)  230 Feet    Assistive device  Rolling walker   left AFO and left sweedish knee cage   Gait Pattern  Step-through pattern   decreased left hip flexion   Ambulation Surface  Level;Indoor    Gait Comments  At end of session pt ambulated 5' with FWW mat to wheelchair without knee brace and was able to control knee for short distance with no recurvatum      Neuro Re-ed    Neuro Re-ed Details   Standing at walker without knee cage weight shifting side to side x 10 with tactile and  verbal cues to control at knee, marching with right LE to facilitate weight shift x 5, tapping airex with right LE to facilitate left weight shift with verbal and tactile cues to left knee, alternating toe taps on airex from floor x 10. Standing without UE support eyes open  without UE  support x 30 sec then with reaching, then standing with feet together x 30 sec.      Exercises   Exercises  --    Other Exercises   Supine left DF/hamstring stretch 1 min x 2, quad sets x 10 with tactile cues, SAQ over bolster  5 x 2 left with tapping on quad to help facilitate only partial contraction, bridging over bolster 10 x 2 with tactile and visual cues to keep pelvis level, sidelying for left clamshell 5 x 2 with tactile cues at pelvis to prevent rolling.              PT Education - 02/23/19 1026    Education Details  Pt was issued seated and supine HEP for strengthening and stretching.    Person(s) Educated  Patient;Spouse    Methods  Explanation;Demonstration;Handout;Tactile cues;Verbal cues    Comprehension  Verbalized understanding       PT Short Term Goals - 02/18/19 1700      PT SHORT TERM GOAL #1   Title  Pt will be able to perform initial HEP with assist of wife to continue strength, balance and mobility gains on own.    Time  4    Period  Weeks    Status  New    Target Date  03/20/19      PT SHORT TERM GOAL #2   Title  Pt will ambulate 100' with FWW CGA for improved household mobility.    Time  4    Period  Weeks    Status  New    Target Date  03/20/19      PT SHORT TERM GOAL #3   Title  Pt will be able to maintain standing with minimal UE support x 5 min for improved balance and to assist with ADLs.    Time  4    Period  Weeks    Status  New    Target Date  03/20/19      PT SHORT TERM GOAL #4   Title  Pt will be independent with transfers from varied surfaces for improved mobility at home.    Time  4    Period  Weeks    Status  New    Target Date  03/20/19         PT  Long Term Goals - 02/18/19 1706      PT LONG TERM GOAL #1   Title  Pt will be able to ambulate >500' with FWW independently for short community distances.    Time  7    Period  Weeks    Status  New    Target Date  04/08/19      PT LONG TERM GOAL #2   Title  Pt will decrease TUG from 28 sec to <18 sec for improved balance and decreased fall risk.    Baseline  28 sec on 02/18/19    Time  7    Period  Weeks    Status  New    Target Date  04/08/19      PT LONG TERM GOAL #3   Title  Pt will increase gait speed from from 0.70ms to >0.8 m/s for improved gait safety.    Baseline  0.554m on 02/18/19    Time  7    Period  Weeks    Status  New    Target Date  04/08/19      PT LONG TERM GOAL #4   Title  Pt will be independent with progressive HEP for strength, balance and gait to continue gains on own.    Time  7    Period  Weeks    Status  New    Target Date  04/08/19            Plan - 02/23/19 1027    Clinical Impression Statement  Pt was able to increase gait distance today. As fatigued did have decreased left foot clearance and lacking left hip flexion more. Pt with 2-/5 left quad strength able to intiate slight movement with tapping on muscle to facilitate in supine. Pt was able to show some improvement in left knee control in standing after practice.    Personal Factors and Comorbidities  Comorbidity 3+    Examination-Activity Limitations  Stairs;Stand;Squat;Locomotion Level;Transfers;Bed Mobility    Examination-Participation Restrictions  Community Activity;Shop;Driving;Cleaning;Yard Work    Stability/Clinical Decision Making  Evolving/Moderate complexity    Rehab Potential  Good    PT Frequency  Other (comment)   3x/week x 1 week followed by 2x/week for 6 weeks   PT Duration  Other (comment)   7 weeks   PT Treatment/Interventions  Electrical Stimulation;DME Instruction;ADLs/Self Care Home Management;Gait training;Stair training;Functional mobility  training;Therapeutic activities;Therapeutic exercise;Balance training;Orthotic Fit/Training;Patient/family education;Neuromuscular re-education;Wheelchair mobility training;Passive range of motion    PT Next Visit Plan  See how HEP has started out. Continue gait training, left hip and knee strengthening with working on weight shifting activities as well. Standing balance. Control with sit to stand transfers.    Consulted and Agree with Plan of Care  Patient;Family member/caregiver    Family Member Consulted  wife       Patient will benefit from skilled therapeutic intervention in order to improve the following deficits and impairments:  Abnormal gait, Difficulty walking, Decreased balance, Decreased mobility, Decreased strength, Decreased knowledge of use of DME, Decreased endurance, Decreased activity tolerance, Decreased range of motion, Pain, Increased muscle spasms  Visit Diagnosis: Other abnormalities of gait and mobility  Hemiplegia and hemiparesis following cerebral infarction affecting left non-dominant side (HCC)  Muscle weakness (generalized)     Problem List Patient Active Problem List   Diagnosis Date Noted  . Acute blood loss anemia   . Steroid-induced hyperglycemia   . Acute on chronic anemia   . Seizures ([place]  . Spastic  hemiparesis ([place])   . 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 Hypertension   . Thrombocytopenia ([place])   . Acute unilateral cerebral infarction in a watershed distribution [facility]) 01/15/2019  . Weakness   . CVA (cerebral vascular accident) ([place]) 01/12/2019  . Malnutrition of moderate degree 07/31/2018  . Elevated serum protein level   . Multiple myeloma ([place])   . Pancytopenia ([place])   . Normocytic normochromic anemia 07/16/2018  . Acute kidney failure ([place]) 07/16/2018  . NICM (nonischemic cardiomyopathy) ([place]) 11/12/2013  . History of ETOH abuse 11/12/2013  . Chronic combined systolic and diastolic CHF (congestive heart failure) ([place]) 11/12/2013  .  Congestive dilated cardiomyopathy ([place]) 11/04/2013  . Malignant hypertension     Electa Sniff, PT, DPT, NCS 02/23/2019, 10:31 AM  [facility] [address] [place], [place], [address] Phone: [phone]   Fax:  [phone]  Name: Jeremy Grant MRN: [id number] Date of Birth: 1960/09/06

## 2019-02-23 NOTE — Patient Instructions (Signed)
Access Code: [email or web address]  URL: [email or web address]  Date: 02/23/2019  Prepared by: Cherly Anderson   Exercises Seated Hamstring Stretch with Strap - 4 reps - 1 sets - 1 min hold - 3x daily - 7x weekly Supine Short Arc Quad - 10 reps - 1 sets - 2x daily - 7x weekly Supine Bridge with Pelvic Floor Contraction on Swiss Ball - 10 reps - 1 sets - 2x daily - 7x weekly Supine Ankle Dorsiflexion Stretch with Caregiver - 4 reps - 1 sets - 1 min hold - 3x daily - 7x weekly Clamshell - 5 reps - 2 sets - 1x daily - 7x weekly

## 2019-02-26 ENCOUNTER — Other Ambulatory Visit: Payer: Self-pay

## 2019-02-26 ENCOUNTER — Ambulatory Visit: Payer: BC Managed Care – PPO

## 2019-02-26 DIAGNOSIS — M6281 Muscle weakness (generalized): Secondary | ICD-10-CM

## 2019-02-26 DIAGNOSIS — R2689 Other abnormalities of gait and mobility: Secondary | ICD-10-CM

## 2019-02-26 NOTE — Therapy (Signed)
[facility] [address] [place], [place], [address] Phone: [phone]   Fax:  [phone]  Physical Therapy Treatment  Patient Details  Name: Jeremy Grant MRN: [id number] Date of Birth: 1960-10-11 Referring Provider (PT): Dr. Letta Pate   Encounter Date: 02/26/2019  PT End of Session - 02/26/19 0846    Visit Number  3    Number of Visits  16    Date for PT Re-Evaluation  05/14/19    Authorization Type  BCBS    PT Start Time  [phone]    PT Stop Time  0930    PT Time Calculation (min)  46 min    Equipment Utilized During Treatment  Gait belt   pt had his own gait belt, wore left AFO and sweedish knee cage of his   Activity Tolerance  Patient tolerated treatment well    Behavior During Therapy  [facility] for tasks assessed/performed       Past Medical History:  Diagnosis Date  . Arthritis   . CHF (congestive heart failure) ([place])   . Combined systolic and diastolic cardiac dysfunction    Echo 11/03/2013 EF 41%, grade 3 diastolic dysfunction  . Hypertension   . NICM (nonischemic cardiomyopathy) ([place])    a. L/RHC (11/05/13): RA: 3, RV 52/5, PA 49/19 (31), PCWP 10, AO 166/93, PA 67%, Fick CO/CI: 5.71/2.97, Lmain: normal, LAD: large, without signficant dz, first diagonal has 20% dz at ostium, LCx: normal, RCA: 30% stenosis at the bifurcation of PDA and PLOM    Past Surgical History:  Procedure Laterality Date  . BIOPSY  01/31/2019   Procedure: BIOPSY;  Surgeon: Otis Brace, MD;  Location: [facility] ENDOSCOPY;  Service: Gastroenterology;;  . ESOPHAGOGASTRODUODENOSCOPY N/A 01/31/2019   Procedure: ESOPHAGOGASTRODUODENOSCOPY (EGD);  Surgeon: Otis Brace, MD;  Location: [facility] ENDOSCOPY;  Service: Gastroenterology;  Laterality: N/A;  . IR FLUORO GUIDE CV LINE RIGHT  07/25/2018  . IR US GUIDE VASC ACCESS RIGHT  07/25/2018  . LEFT AND RIGHT HEART CATHETERIZATION WITH CORONARY ANGIOGRAM N/A 11/05/2013   Procedure: LEFT AND RIGHT  HEART CATHETERIZATION WITH CORONARY ANGIOGRAM;  Surgeon: Peter M Martinique, MD;  Location: [facility] CATH LAB;  Service: Cardiovascular;  Laterality: N/A;  . RIGHT HEART CATHETERIZATION N/A 01/14/2014   Procedure: RIGHT HEART CATH;  Surgeon: Larey Dresser, MD;  Location: [facility] CATH LAB;  Service: Cardiovascular;  Laterality: N/A;    There were no vitals filed for this visit.  Subjective Assessment - 02/26/19 0847    Subjective  Pt reports doing well. Did work on exercises and clamshell was a little difficult to perform.    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 8/31-9/3 then inpatient rehab 9/3-9/23.PMH: CHF, HTN, hyperlipidemia, glomerulonephritis, pancytopenia/chronic anemia, seizure left-sided post stroke    Patient Stated Goals  Pt wants to be able to walk again.    Currently in Pain?  No/denies    Pain Onset  1 to 4 weeks ago                       [facility] Adult PT Treatment/Exercise - 02/26/19 0856      Bed Mobility   Bed Mobility  Rolling Right;Supine to Sit;Sit to Supine    Rolling Right  Independent    Rolling Left  Independent    Supine to Sit  Independent    Sit to Supine  Independent      Transfers  Transfers  Sit to Stand    Sit to Stand  5: Supervision    Sit to Stand Details  Verbal cues for technique      Ambulation/Gait   Ambulation/Gait  Yes    Ambulation/Gait Assistance  4: Min guard    Ambulation Distance (Feet)  230 Feet    Assistive device  Rolling walker   left knee cage and AFO   Gait Comments  Pt also ambulated 115' without knee cage. Pt able to take a couple steps without left knee recurvatum but unable to maintain. PT provided min tactile cues to posterior knee and able to stop from occuring.      Neuro Re-ed    Neuro Re-ed Details   Sit to stand from edge of mat with 2" step under right foot 5 x2 with cuing again to control descent and not just pop up. Pt got  balance between each set. Standing with tapping 2" step with right foot with verbal and tactile cues to control shift over left leg at knee, standing with 2" step under right foot with reaching for targets CGA/min assist to facilitate weight shift. Pt shifting weight posterior and losing balance a times with reaching forward.      Exercises   Exercises  Other Exercises    Other Exercises   Supine LAQ x 10 then x 5 on left with muscle tapping to help facilitate. Partial range performed but coming up some at hips to compensate, Bridges over bolster 10 x 2 with verbal and tactile cues to raise left hip more to keep level. Sidelying left calmshell with verbal and tactile cues to stay on side. Sit to stand x 5 from edge of mat with cues to control movement. BP=162/80 after exercise             PT Education - 02/26/19 1020    Education Details  Pt instructed to continue with current HEP and to focus on control with sit to stand.    Person(s) Educated  Patient;Spouse    Methods  Explanation;Demonstration    Comprehension  Verbalized understanding;Returned demonstration       PT Short Term Goals - 02/26/19 1023      PT SHORT TERM GOAL #1   Title  Pt will be able to perform initial HEP with assist of wife to continue strength, balance and mobility gains on own.    Time  4    Period  Weeks    Status  New    Target Date  03/20/19      PT SHORT TERM GOAL #2   Title  Pt will ambulate 100' with FWW CGA for improved household mobility.    Baseline  Pt ambulated 230' with FWW CGA with sweedish knee cage and left AFO donned.    Time  4    Period  Weeks    Status  Achieved    Target Date  03/20/19      PT SHORT TERM GOAL #3   Title  Pt will be able to maintain standing with minimal UE support x 5 min for improved balance and to assist with ADLs.    Time  4    Period  Weeks    Status  New    Target Date  03/20/19      PT SHORT TERM GOAL #4   Title  Pt will be independent with transfers  from varied surfaces for improved mobility at home.    Time  4  Period  Weeks    Status  New    Target Date  03/20/19        PT Long Term Goals - 02/18/19 1706      PT LONG TERM GOAL #1   Title  Pt will be able to ambulate >500' with FWW independently for short community distances.    Time  7    Period  Weeks    Status  New    Target Date  04/08/19      PT LONG TERM GOAL #2   Title  Pt will decrease TUG from 28 sec to <18 sec for improved balance and decreased fall risk.    Baseline  28 sec on 02/18/19    Time  7    Period  Weeks    Status  New    Target Date  04/08/19      PT LONG TERM GOAL #3   Title  Pt will increase gait speed from from 0.46ms to >0.8 m/s for improved gait safety.    Baseline  0.542m on 02/18/19    Time  7    Period  Weeks    Status  New    Target Date  04/08/19      PT LONG TERM GOAL #4   Title  Pt will be independent with progressive HEP for strength, balance and gait to continue gains on own.    Time  7    Period  Weeks    Status  New    Target Date  04/08/19            Plan - 02/26/19 1021    Clinical Impression Statement  Pt was able to initiate more left quad contraction with activities today. Still requiring some tactile input to posterior knee with gait to prevent recurvatum so PT advising knee cage still. Pt was challenged with step under right foot to help increase left weight shift with transfers and standing but able to control left knee during these activities.    Personal Factors and Comorbidities  Comorbidity 3+    Examination-Activity Limitations  Stairs;Stand;Squat;Locomotion Level;Transfers;Bed Mobility    Examination-Participation Restrictions  Community Activity;Shop;Driving;Cleaning;Yard Work    Stability/Clinical Decision Making  Evolving/Moderate complexity    Rehab Potential  Good    PT Frequency  Other (comment)   3x/week x 1 week followed by 2x/week for 6 weeks   PT Duration  Other (comment)   7 weeks   PT  Treatment/Interventions  Electrical Stimulation;DME Instruction;ADLs/Self Care Home Management;Gait training;Stair training;Functional mobility training;Therapeutic activities;Therapeutic exercise;Balance training;Orthotic Fit/Training;Patient/family education;Neuromuscular re-education;Wheelchair mobility training;Passive range of motion    PT Next Visit Plan  Continue gait training, left hip and knee strengthening with working on weight shifting activities as well. Standing balance. Control with sit to stand transfers.    Consulted and Agree with Plan of Care  Patient;Family member/caregiver    Family Member Consulted  wife       Patient will benefit from skilled therapeutic intervention in order to improve the following deficits and impairments:  Abnormal gait, Difficulty walking, Decreased balance, Decreased mobility, Decreased strength, Decreased knowledge of use of DME, Decreased endurance, Decreased activity tolerance, Decreased range of motion, Pain, Increased muscle spasms  Visit Diagnosis: Other abnormalities of gait and mobility  Muscle weakness (generalized)     Problem List Patient Active Problem List   Diagnosis Date Noted  . Acute blood loss anemia   . Steroid-induced hyperglycemia   . Acute on chronic anemia   .  Seizures ([place])   . Spastic hemiparesis ([place])   . 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 Hypertension   . Thrombocytopenia ([place])   . Acute unilateral cerebral infarction in a watershed distribution [facility]) 01/15/2019  . Weakness   . CVA (cerebral vascular accident) ([place]) 01/12/2019  . Malnutrition of moderate degree 07/31/2018  . Elevated serum protein level   . Multiple myeloma ([place])   . Pancytopenia ([place])   . Normocytic normochromic anemia 07/16/2018  . Acute kidney failure ([place]) 07/16/2018  . NICM (nonischemic cardiomyopathy) ([place]) 11/12/2013  . History of ETOH abuse 11/12/2013  . Chronic combined systolic and diastolic CHF (congestive heart failure) ([place])  11/12/2013  . Congestive dilated cardiomyopathy ([place]) 11/04/2013  . Malignant hypertension     Electa Sniff, PT, DPT, NCS 02/26/2019, 10:25 AM  [facility] [address] [place], [place], [address] Phone: [phone]   Fax:  [phone]  Name: Jeremy Grant MRN: [id number] Date of Birth: [birth year]

## 2019-02-27 ENCOUNTER — Ambulatory Visit: Payer: BC Managed Care – PPO

## 2019-02-27 DIAGNOSIS — R2689 Other abnormalities of gait and mobility: Secondary | ICD-10-CM

## 2019-02-27 DIAGNOSIS — M6281 Muscle weakness (generalized): Secondary | ICD-10-CM

## 2019-02-27 NOTE — Therapy (Signed)
[facility] [address] [place], [place], [address] Phone: [phone]   Fax:  [phone]  Physical Therapy Treatment  Patient Details  Name: Jeremy Grant MRN: [id number] Date of Birth: 1960/10/26 Referring Provider (PT): Dr. Letta Pate   Encounter Date: 02/27/2019  PT End of Session - 02/27/19 0717    Visit Number  4    Number of Visits  16    Date for PT Re-Evaluation  05/14/19    Authorization Type  BCBS    PT Start Time  0713    PT Stop Time  0757    PT Time Calculation (min)  44 min    Equipment Utilized During Treatment  Gait belt   pt had his own gait belt, wore left AFO and sweedish knee cage of his   Activity Tolerance  Patient tolerated treatment well    Behavior During Therapy  [facility] for tasks assessed/performed       Past Medical History:  Diagnosis Date  . Arthritis   . CHF (congestive heart failure) ([place])   . Combined systolic and diastolic cardiac dysfunction    Echo 11/03/2013 EF 23%, grade 3 diastolic dysfunction  . Hypertension   . NICM (nonischemic cardiomyopathy) ([place])    a. L/RHC (11/05/13): RA: 3, RV 52/5, PA 49/19 (31), PCWP 10, AO 166/93, PA 67%, Fick CO/CI: 5.71/2.97, Lmain: normal, LAD: large, without signficant dz, first diagonal has 20% dz at ostium, LCx: normal, RCA: 30% stenosis at the bifurcation of PDA and PLOM    Past Surgical History:  Procedure Laterality Date  . BIOPSY  01/31/2019   Procedure: BIOPSY;  Surgeon: Otis Brace, MD;  Location: [facility];  Service: Gastroenterology;;  . ESOPHAGOGASTRODUODENOSCOPY N/A 01/31/2019   Procedure: ESOPHAGOGASTRODUODENOSCOPY (EGD);  Surgeon: Otis Brace, MD;  Location: [facility] ENDOSCOPY;  Service: Gastroenterology;  Laterality: N/A;  . IR FLUORO GUIDE CV LINE RIGHT  07/25/2018  . IR US GUIDE VASC ACCESS RIGHT  07/25/2018  . LEFT AND RIGHT HEART CATHETERIZATION WITH CORONARY ANGIOGRAM N/A 11/05/2013   Procedure: LEFT AND RIGHT  HEART CATHETERIZATION WITH CORONARY ANGIOGRAM;  Surgeon: Peter M Martinique, MD;  Location: [facility] CATH LAB;  Service: Cardiovascular;  Laterality: N/A;  . RIGHT HEART CATHETERIZATION N/A 01/14/2014   Procedure: RIGHT HEART CATH;  Surgeon: Larey Dresser, MD;  Location: [facility] CATH LAB;  Service: Cardiovascular;  Laterality: N/A;    There were no vitals filed for this visit.  Subjective Assessment - 02/27/19 0717    Subjective  Pt reports that he did not sleep well last night as continues to have sharp shooting pains in left foot at night that wake him up. PT advised to notify neurologist to have them check to see if something else he can take besides muscle relaxer.    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 8/31-9/3 then inpatient rehab 9/3-9/23.PMH: CHF, HTN, hyperlipidemia, glomerulonephritis, pancytopenia/chronic anemia, seizure left-sided post stroke    Patient Stated Goals  Pt wants to be able to walk again.    Pain Onset  1 to 4 weeks ago                       [facility] Adult PT Treatment/Exercise - 02/27/19 0730      Transfers   Transfers  Sit to Stand;Stand to Sit    Sit to Stand  5: Supervision    Sit to Stand Details  Verbal cues for technique    Stand to Sit  5: Supervision    Stand to Sit Details (indicate cue type and reason)  Verbal cues for technique   to control movement     Ambulation/Gait   Ambulation/Gait  Yes    Ambulation Distance (Feet)  230 Feet    Assistive device  Rolling walker   left AFO   Gait Pattern  Step-through pattern;Left genu recurvatum;Decreased hip/knee flexion - left    Ambulation Surface  Level;Indoor    Gait Comments  Pt ambulated 230' with FWW and left AFO with min assist at left knee to prevent recurvatum with tactile cues.       Neuro Re-ed    Neuro Re-ed Details   Sit to stand 5 x 2 from low mat with 4" step under right foot to faciliate left weight  shift, standing with right foot on 4" step with weight shifting and then with reaching, standing feet together eyes open and eyes closed x 30 sec, feet together with reaching x 30 sec. CGA for safety and cues to shift weight and not lean back.      Exercises   Exercises  Other Exercises    Other Exercises   Supine left hip flex/ext with PT providing min resistance into flexion and tactile and verbal cueing to push back down and try to control. Pt unable to control terminal extension at knee, left quad sets x 5 with tapping to try to facilitate but only trace movement in this position, trying to keep left hip in hooklying position with partial range internal and extenal to PT hands as targets x 5 each, left foot on red theraball with hamstring curls10 x 2 with PT stabilizing ball, bridges on ball 10 x 2 with cues to try to level out hips and PT stabilizing ball.             PT Education - 02/27/19 0847    Education Details  Pt to continue with current HEP    Person(s) Educated  Patient;Spouse    Methods  Explanation    Comprehension  Verbalized understanding       PT Short Term Goals - 02/26/19 1023      PT SHORT TERM GOAL #1   Title  Pt will be able to perform initial HEP with assist of wife to continue strength, balance and mobility gains on own.    Time  4    Period  Weeks    Status  New    Target Date  03/20/19      PT SHORT TERM GOAL #2   Title  Pt will ambulate 100' with FWW CGA for improved household mobility.    Baseline  Pt ambulated 230' with FWW CGA with sweedish knee cage and left AFO donned.    Time  4    Period  Weeks    Status  Achieved    Target Date  03/20/19      PT SHORT TERM GOAL #3   Title  Pt will be able to maintain standing with minimal UE support x 5 min for improved balance and to assist with ADLs.    Time  4    Period  Weeks    Status  New    Target Date  03/20/19      PT SHORT TERM GOAL #4   Title  Pt will be independent with transfers from  varied surfaces for improved mobility at home.  Time  4    Period  Weeks    Status  New    Target Date  03/20/19        PT Long Term Goals - 02/18/19 1706      PT LONG TERM GOAL #1   Title  Pt will be able to ambulate >500' with FWW independently for short community distances.    Time  7    Period  Weeks    Status  New    Target Date  04/08/19      PT LONG TERM GOAL #2   Title  Pt will decrease TUG from 28 sec to <18 sec for improved balance and decreased fall risk.    Baseline  28 sec on 02/18/19    Time  7    Period  Weeks    Status  New    Target Date  04/08/19      PT LONG TERM GOAL #3   Title  Pt will increase gait speed from from 0.12ms to >0.8 m/s for improved gait safety.    Baseline  0.56m on 02/18/19    Time  7    Period  Weeks    Status  New    Target Date  04/08/19      PT LONG TERM GOAL #4   Title  Pt will be independent with progressive HEP for strength, balance and gait to continue gains on own.    Time  7    Period  Weeks    Status  New    Target Date  04/08/19            Plan - 02/27/19 0849    Clinical Impression Statement  Pt continues to need tactile assist to prevent recurvatum at left knee during gait. Only able to maintain for about 10-15'.Pt showed improved control with transfers today.  Pt will benefit from continued PT to focus on strength, balance and gait.    Personal Factors and Comorbidities  Comorbidity 3+    Examination-Activity Limitations  Stairs;Stand;Squat;Locomotion Level;Transfers;Bed Mobility    Examination-Participation Restrictions  Community Activity;Shop;Driving;Cleaning;Yard Work    Stability/Clinical Decision Making  Evolving/Moderate complexity    Rehab Potential  Good    PT Frequency  Other (comment)   3x/week x 1 week followed by 2x/week for 6 weeks   PT Duration  Other (comment)   7 weeks   PT Treatment/Interventions  Electrical Stimulation;DME Instruction;ADLs/Self Care Home Management;Gait training;Stair  training;Functional mobility training;Therapeutic activities;Therapeutic exercise;Balance training;Orthotic Fit/Training;Patient/family education;Neuromuscular re-education;Wheelchair mobility training;Passive range of motion    PT Next Visit Plan  Continue gait training, left hip and knee strengthening with working on weight shifting activities as well. Standing balance.    Consulted and Agree with Plan of Care  Patient;Family member/caregiver    Family Member Consulted  wife       Patient will benefit from skilled therapeutic intervention in order to improve the following deficits and impairments:  Abnormal gait, Difficulty walking, Decreased balance, Decreased mobility, Decreased strength, Decreased knowledge of use of DME, Decreased endurance, Decreased activity tolerance, Decreased range of motion, Pain, Increased muscle spasms  Visit Diagnosis: Other abnormalities of gait and mobility  Muscle weakness (generalized)     Problem List Patient Active Problem List   Diagnosis Date Noted  . Acute blood loss anemia   . Steroid-induced hyperglycemia   . Acute on chronic anemia   . Seizures ([place]  . Spastic hemiparesis ([place]  . [place]itis [place]  .  Hypertension   . Thrombocytopenia ([place])   . Acute unilateral cerebral infarction in a watershed distribution [facility]) 01/15/2019  . Weakness   . CVA (cerebral vascular accident) ([place]) 01/12/2019  . Malnutrition of moderate degree 07/31/2018  . Elevated serum protein level   . Multiple myeloma ([place])   . Pancytopenia ([place])   . Normocytic normochromic anemia 07/16/2018  . Acute kidney failure ([place]) 07/16/2018  . NICM (nonischemic cardiomyopathy) ([place]) 11/12/2013  . History of ETOH abuse 11/12/2013  . Chronic combined systolic and diastolic CHF (congestive heart failure) ([place]) 11/12/2013  . Congestive dilated cardiomyopathy ([place]) 11/04/2013  . Malignant hypertension     Electa Sniff, PT, DPT, NCS 02/27/2019, 8:53  AM  [facility] [address] [place], [place], [address] Phone: [phone]   Fax:  [phone]  Name: Jeremy Grant MRN: [id number] Date of Birth: May 03, 1961

## 2019-03-02 ENCOUNTER — Ambulatory Visit (HOSPITAL_COMMUNITY)
Admission: RE | Admit: 2019-03-02 | Discharge: 2019-03-02 | Disposition: A | Discharge status: Home / Self Care | Payer: BC Managed Care – PPO | Source: Ambulatory Visit | Attending: Nephrology | Admitting: Nephrology

## 2019-03-02 ENCOUNTER — Other Ambulatory Visit: Payer: Self-pay

## 2019-03-02 VITALS — BP 169/102 | HR 87 | Resp 20

## 2019-03-02 DIAGNOSIS — N179 Acute kidney failure, unspecified: Secondary | ICD-10-CM | POA: Diagnosis not present

## 2019-03-02 LAB — POCT HEMOGLOBIN-HEMACUE: Hemoglobin: 13.4 g/dL (ref 13.0–17.0)

## 2019-03-02 MED ORDER — EPOETIN ALFA-EPBX 10000 UNIT/ML IJ SOLN
20000.0000 [IU] | INTRAMUSCULAR | Status: DC
  Filled 2019-03-02: qty 2

## 2019-03-03 ENCOUNTER — Ambulatory Visit: Payer: BC Managed Care – PPO

## 2019-03-03 ENCOUNTER — Other Ambulatory Visit: Payer: Self-pay

## 2019-03-03 DIAGNOSIS — R2689 Other abnormalities of gait and mobility: Secondary | ICD-10-CM | POA: Diagnosis not present

## 2019-03-03 DIAGNOSIS — M6281 Muscle weakness (generalized): Secondary | ICD-10-CM

## 2019-03-03 NOTE — Therapy (Signed)
[facility] [address] [place], [place], [address] Phone: [phone]   Fax:  [phone]  Physical Therapy Treatment  Patient Details  Name: Jeremy Grant MRN: [id number] Date of Birth: 24-May-1960 Referring Provider (PT): Dr. Letta Pate   Encounter Date: 03/03/2019  PT End of Session - 03/03/19 1013    Visit Number  5    Number of Visits  16    Date for PT Re-Evaluation  05/14/19    Authorization Type  BCBS    PT Start Time  1010    PT Stop Time  1053    PT Time Calculation (min)  43 min    Equipment Utilized During Treatment  Gait belt   pt had his own gait belt, wore left AFO and sweedish knee cage of his   Activity Tolerance  Patient tolerated treatment well    Behavior During Therapy  [facility] for tasks assessed/performed       Past Medical History:  Diagnosis Date  . Arthritis   . CHF (congestive heart failure) ([place])   . Combined systolic and diastolic cardiac dysfunction    Echo 11/03/2013 EF 27%, grade 3 diastolic dysfunction  . Hypertension   . NICM (nonischemic cardiomyopathy) ([place])    a. L/RHC (11/05/13): RA: 3, RV 52/5, PA 49/19 (31), PCWP 10, AO 166/93, PA 67%, Fick CO/CI: 5.71/2.97, Lmain: normal, LAD: large, without signficant dz, first diagonal has 20% dz at ostium, LCx: normal, RCA: 30% stenosis at the bifurcation of PDA and PLOM    Past Surgical History:  Procedure Laterality Date  . BIOPSY  01/31/2019   Procedure: BIOPSY;  Surgeon: Otis Brace, MD;  Location: [facility] ENDOSCOPY;  Service: Gastroenterology;;  . ESOPHAGOGASTRODUODENOSCOPY N/A 01/31/2019   Procedure: ESOPHAGOGASTRODUODENOSCOPY (EGD);  Surgeon: Otis Brace, MD;  Location[facility] ENDOSCOPY;  Service: Gastroenterology;  Laterality: N/A;  . IR FLUORO GUIDE CV LINE RIGHT  07/25/2018  . IR US GUIDE VASC ACCESS RIGHT  07/25/2018  . LEFT AND RIGHT HEART CATHETERIZATION WITH CORONARY ANGIOGRAM N/A 11/05/2013   Procedure: LEFT AND RIGHT  HEART CATHETERIZATION WITH CORONARY ANGIOGRAM;  Surgeon: Peter M Martinique, MD;  Location: [facility] CATH LAB;  Service: Cardiovascular;  Laterality: N/A;  . RIGHT HEART CATHETERIZATION N/A 01/14/2014   Procedure: RIGHT HEART CATH;  Surgeon: Larey Dresser, MD;  Location: [facility] CATH LAB;  Service: Cardiovascular;  Laterality: N/A;    There were no vitals filed for this visit.  Subjective Assessment - 03/03/19 1012    Subjective  Pt reports doing well other than some left hip pain at night when lying on that side.    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 8/31-9/3 then inpatient rehab 9/3-9/23.PMH: CHF, HTN, hyperlipidemia, glomerulonephritis, pancytopenia/chronic anemia, seizure left-sided post stroke    Patient Stated Goals  Pt wants to be able to walk again.    Currently in Pain?  No/denies    Pain Onset  1 to 4 weeks ago                       [facility] Adult PT Treatment/Exercise - 03/03/19 1019      Ambulation/Gait   Ambulation/Gait  Yes    Ambulation/Gait Assistance  5: Supervision    Ambulation/Gait Assistance Details  Verbal cues to focus on increasing left hip flexion    Ambulation Distance (Feet)  460 Feet    Assistive device  Rolling walker  left AFO and sweedish knee cage   Gait Pattern  Step-through pattern    Ambulation Surface  Level;Indoor    Gait velocity  0.19ms      Neuro Re-ed    Neuro Re-ed Details   Standing in // bars: tapping 2" step with right LE with facilitating weight shift over left LE with verbal and tactile cues to prevent recurvatum x 10, step-ups on 2" step with bilateral UE support x 10 and then with right UE support only x 10 left LE with tactile cues at knee. Repeated the prior with utilizing 4" step with same reps. PT also help facilitate left weight shift at hips while monitoring knee.      Exercises   Exercises  Other Exercises    Other Exercises   Prone  left hamstring curl 10 x 2 with muscle tapping to try to facilitate contraction. Trace to begin with then as went on patient was able to start to intitiate more contraction through minimal range, supine left hip flexion/ext with resistance to faciliate x 10             PT Education - 03/03/19 1304    Education Details  Pt to continue with current HEP    Person(s) Educated  Patient    Methods  Explanation    Comprehension  Verbalized understanding       PT Short Term Goals - 03/03/19 1305      PT SHORT TERM GOAL #1   Title  Pt will be able to perform initial HEP with assist of wife to continue strength, balance and mobility gains on own.    Time  4    Period  Weeks    Status  New    Target Date  03/20/19      PT SHORT TERM GOAL #2   Title  Pt will ambulate 100' with FWW CGA for improved household mobility.    Baseline  Pt ambulated 230' with FWW CGA with sweedish knee cage and left AFO donned.    Time  4    Period  Weeks    Status  Achieved    Target Date  03/20/19      PT SHORT TERM GOAL #3   Title  Pt will be able to maintain standing with minimal UE support x 5 min for improved balance and to assist with ADLs.    Time  4    Period  Weeks    Status  New    Target Date  03/20/19      PT SHORT TERM GOAL #4   Title  Pt will be independent with transfers from varied surfaces for improved mobility at home.    Time  4    Period  Weeks    Status  New    Target Date  03/20/19        PT Long Term Goals - 03/03/19 1306      PT LONG TERM GOAL #1   Title  Pt will be able to ambulate >500' with FWW independently for short community distances.    Time  7    Period  Weeks    Status  New      PT LONG TERM GOAL #2   Title  Pt will decrease TUG from 28 sec to <18 sec for improved balance and decreased fall risk.    Baseline  28 sec on 02/18/19    Time  7    Period  Weeks  Status  New      PT LONG TERM GOAL #3   Title  Pt will increase gait speed from from 0.64ms  to >0.8 m/s for improved gait safety.    Baseline  0.815m on 03/03/19    Time  7    Period  Weeks    Status  Achieved      PT LONG TERM GOAL #4   Title  Pt will be independent with progressive HEP for strength, balance and gait to continue gains on own.    Time  7    Period  Weeks    Status  New      PT LONG TERM GOAL #5   Title  Pt will increase gait speed from 0.8959mto >1.71m/2mor improved gait safety.    Baseline  0.75m/24m 10/20 20    Time  6    Period  Weeks    Status  New    Target Date  04/08/19            Plan - 03/03/19 1305    Clinical Impression Statement  Pt was able to increase gait distance and speed today meeting gait speed goal. New goal added for gait speed. Pt continues to show improvement in functional left LE strength. Focused on quad control on left with step-ups and weight shifting. Trace left hamstring contraction initially in prone then able to ellicit partial contraction after practice and tapping to help facilitate. Left hamstring weakness definitely contributing with recurvatum.    Personal Factors and Comorbidities  Comorbidity 3+    Examination-Activity Limitations  Stairs;Stand;Squat;Locomotion Level;Transfers;Bed Mobility    Examination-Participation Restrictions  Community Activity;Shop;Driving;Cleaning;Yard Work    Stability/Clinical Decision Making  Evolving/Moderate complexity    Rehab Potential  Good    PT Frequency  Other (comment)   3x/week x 1 week followed by 2x/week for 6 weeks   PT Duration  Other (comment)   7 weeks   PT Treatment/Interventions  Electrical Stimulation;DME Instruction;ADLs/Self Care Home Management;Gait training;Stair training;Functional mobility training;Therapeutic activities;Therapeutic exercise;Balance training;Orthotic Fit/Training;Patient/family education;Neuromuscular re-education;Wheelchair mobility training;Passive range of motion    PT Next Visit Plan  Continue gait training, left hip and knee  strengthening with working on weight shifting activities as well. Standing balance. Left hamstring strengthening to help facilitate. Consider [place]?    Consulted and Agree with Plan of Care  Patient;Family member/caregiver    Family Member Consulted  wife       Patient will benefit from skilled therapeutic intervention in order to improve the following deficits and impairments:  Abnormal gait, Difficulty walking, Decreased balance, Decreased mobility, Decreased strength, Decreased knowledge of use of DME, Decreased endurance, Decreased activity tolerance, Decreased range of motion, Pain, Increased muscle spasms  Visit Diagnosis: Other abnormalities of gait and mobility  Muscle weakness (generalized)     Problem List Patient Active Problem List   Diagnosis Date Noted  . Acute blood loss anemia   . Steroid-induced hyperglycemia   . Acute on chronic anemia   . Seizures (HCC) [place] Spastic hemiparesis (HCC) [place] ANCA-associated vasculitis (HCC) [place] Hypertension   . Thrombocytopenia (HCC) [employer] Acute unilateral cerebral infarction in a watershed distribution (HCC)[facility]  . Weakness   . CVA (cerebral vascular accident) (HCC) [place]31/2020  . Malnutrition of moderate degree 07/31/2018  . Elevated serum protein level   . Multiple myeloma (HCC) [place] Pancytopenia (HCC) [place] Normocytic normochromic anemia 07/16/2018  . Acute kidney  failure ([place]) 07/16/2018  . NICM (nonischemic cardiomyopathy) ([place]) 11/12/2013  . History of ETOH abuse 11/12/2013  . Chronic combined systolic and diastolic CHF (congestive heart failure) ([place]) 11/12/2013  . Congestive dilated cardiomyopathy ([place]) 11/04/2013  . Malignant hypertension     Electa Sniff, PT, DPT, NCS 03/03/2019, 1:12 PM  [facility] [address] [place], [place], [address] Phone: [phone]   Fax:  [phone]  Name: Jeremy Grant MRN: [id number] Date of Birth:  28-Mar-1961

## 2019-03-05 ENCOUNTER — Other Ambulatory Visit: Payer: Self-pay

## 2019-03-05 ENCOUNTER — Ambulatory Visit: Payer: BC Managed Care – PPO

## 2019-03-05 VITALS — BP 160/78

## 2019-03-05 DIAGNOSIS — R2689 Other abnormalities of gait and mobility: Secondary | ICD-10-CM | POA: Diagnosis not present

## 2019-03-05 DIAGNOSIS — M6281 Muscle weakness (generalized): Secondary | ICD-10-CM

## 2019-03-05 NOTE — Therapy (Signed)
[facility] [address] [place], [place], [address] Phone: [phone]   Fax:  [phone]  Physical Therapy Treatment  Patient Details  Name: Jeremy Grant MRN: [id number] Date of Birth: [birth year] Referring Provider (PT): Dr. Letta Pate   Encounter Date: 03/05/2019  PT End of Session - 03/05/19 0936    Visit Number  6    Number of Visits  16    Date for PT Re-Evaluation  05/14/19    Authorization Type  BCBS    PT Start Time  0933    PT Stop Time  1015    PT Time Calculation (min)  42 min    Equipment Utilized During Treatment  Gait belt   pt had his own gait belt, wore left AFO and sweedish knee cage of his   Activity Tolerance  Patient tolerated treatment well    Behavior During Therapy  [facility] for tasks assessed/performed       Past Medical History:  Diagnosis Date  . Arthritis   . CHF (congestive heart failure) ([place])   . Combined systolic and diastolic cardiac dysfunction    Echo 11/03/2013 EF 27%, grade 3 diastolic dysfunction  . Hypertension   . NICM (nonischemic cardiomyopathy) ([place])    a. L/RHC (11/05/13): RA: 3, RV 52/5, PA 49/19 (31), PCWP 10, AO 166/93, PA 67%, Fick CO/CI: 5.71/2.97, Lmain: normal, LAD: large, without signficant dz, first diagonal has 20% dz at ostium, LCx: normal, RCA: 30% stenosis at the bifurcation of PDA and PLOM    Past Surgical History:  Procedure Laterality Date  . BIOPSY  01/31/2019   Procedure: BIOPSY;  Surgeon: Otis Brace, MD;  Location: [facility] ENDOSCOPY;  Service: Gastroenterology;;  . ESOPHAGOGASTRODUODENOSCOPY N/A 01/31/2019   Procedure: ESOPHAGOGASTRODUODENOSCOPY (EGD);  Surgeon: Otis Brace, MD;  Location: [facility] ENDOSCOPY;  Service: Gastroenterology;  Laterality: N/A;  . IR FLUORO GUIDE CV LINE RIGHT  07/25/2018  . IR US GUIDE VASC ACCESS RIGHT  07/25/2018  . LEFT AND RIGHT HEART CATHETERIZATION WITH CORONARY ANGIOGRAM N/A 11/05/2013   Procedure: LEFT AND RIGHT  HEART CATHETERIZATION WITH CORONARY ANGIOGRAM;  Surgeon: Peter M Martinique, MD;  Location: [facility] CATH LAB;  Service: Cardiovascular;  Laterality: N/A;  . RIGHT HEART CATHETERIZATION N/A 01/14/2014   Procedure: RIGHT HEART CATH;  Surgeon: Larey Dresser, MD;  Location: [facility] C[facility] LAB;  Service: Cardiovascular;  Laterality: N/A;    Vitals:   03/05/19 0939  BP: (!) 160/78    Subjective Assessment - 03/05/19 0936    Subjective  Pt saw kidney doctor yesterday and they added carvedilol.    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 8/31-9/3 then inpatient rehab 9/3-9/23.PMH: CHF, HTN, hyperlipidemia, glomerulonephritis, pancytopenia/chronic anemia, seizure left-sided post stroke    Patient Stated Goals  Pt wants to be able to walk again.    Currently in Pain?  No/denies    Pain Onset  1 to 4 weeks ago                       [facility] Adult PT Treatment/Exercise - 03/05/19 1006      Ambulation/Gait   Ambulation/Gait  Yes    Ambulation/Gait Assistance  4: Min assist    Ambulation Distance (Feet)  115 Feet    Assistive device  Straight cane    Gait Pattern  Step-through pattern   w/c follwo   Ambulation Surface  Level;Indoor  Gait Comments  BP=170/88 after gait. Rechecked after 5 min and remained the same.      Neuro Re-ed    Neuro Re-ed Details   In // bars step-ups on 4" step with left LE 10 x 2 with verbal and tactile cuing to shift weight over leg and control at knee. Pt performed with bilateral UE support 1st bout and only right UE support second bout. Tapping 4" step with right foot with only right UE support to faciliate left weight shift x 10. Gait in // bars with right UE support only CGA with verbal and tactile cues to shift over left leg. Pt only had minimal right UE support and let go a couple times. No noted recurvatum.      Exercises   Exercises  Other Exercises    Other Exercises   Prone  left hamstring curls x 10 then x 5 with PT providing muscle tapping to help facilitate contraction. Performed through partial range mod/min assist as went on with patient able to kick up more with concentric contraction, seated hamstring curls with PT bringing left knee into extension and then providing resistance to help facilitate contraction x 5, seated left hamstring curls active assist with sliding on floor 5 x 2. Pt able to flex/ext with this set up through partial range.             PT Education - 03/05/19 1036    Education Details  Added seated knee flexion on tile floor to help slide foot to HEP    Person(s) Educated  Patient;Spouse    Methods  Explanation;Demonstration;Handout    Comprehension  Verbalized understanding;Returned demonstration       PT Short Term Goals - 03/03/19 1305      PT SHORT TERM GOAL #1   Title  Pt will be able to perform initial HEP with assist of wife to continue strength, balance and mobility gains on own.    Time  4    Period  Weeks    Status  New    Target Date  03/20/19      PT SHORT TERM GOAL #2   Title  Pt will ambulate 100' with FWW CGA for improved household mobility.    Baseline  Pt ambulated 230' with FWW CGA with sweedish knee cage and left AFO donned.    Time  4    Period  Weeks    Status  Achieved    Target Date  03/20/19      PT SHORT TERM GOAL #3   Title  Pt will be able to maintain standing with minimal UE support x 5 min for improved balance and to assist with ADLs.    Time  4    Period  Weeks    Status  New    Target Date  03/20/19      PT SHORT TERM GOAL #4   Title  Pt will be independent with transfers from varied surfaces for improved mobility at home.    Time  4    Period  Weeks    Status  New    Target Date  03/20/19        PT Long Term Goals - 03/03/19 1306      PT LONG TERM GOAL #1   Title  Pt will be able to ambulate >500' with FWW independently for short community distances.    Time  7    Period   Weeks    Status  New  PT LONG TERM GOAL #2   Title  Pt will decrease TUG from 28 sec to <18 sec for improved balance and decreased fall risk.    Baseline  28 sec on 02/18/19    Time  7    Period  Weeks    Status  New      PT LONG TERM GOAL #3   Title  Pt will increase gait speed from from 0.91ms to >0.8 m/s for improved gait safety.    Baseline  0.829m on 03/03/19    Time  7    Period  Weeks    Status  Achieved      PT LONG TERM GOAL #4   Title  Pt will be independent with progressive HEP for strength, balance and gait to continue gains on own.    Time  7    Period  Weeks    Status  New      PT LONG TERM GOAL #5   Title  Pt will increase gait speed from 0.8911mto >1.6m/38mor improved gait safety.    Baseline  0.33m/18m 10/20 20    Time  6    Period  Weeks    Status  New    Target Date  04/08/19            Plan - 03/05/19 1037    Clinical Impression Statement  Pt showed improved left knee control with gait today with no episodes of recurvatum with gait without sweedish knee cage. Getting more activation at hamstring which is helping. Pt was able to initiate gait with SPC today and still maintained knee control. BP was slightly more elevated during visit today and remained a little higher at end of session. Pt instructed to monitor. New BP was started yesterday.Bioness would not be appropriate at this time due to recent seizures.    PT Frequency  Other (comment)    PT Duration  Other (comment)    PT Next Visit Plan  Monitor BP with treatment. Continue left hamstring strengthening, weight shifting/step acitivities, gait with SPC, standing balance.    Consulted and Agree with Plan of Care  Patient       Patient will benefit from skilled therapeutic intervention in order to improve the following deficits and impairments:     Visit Diagnosis: Other abnormalities of gait and mobility  Muscle weakness (generalized)     Problem List Patient Active Problem List    Diagnosis Date Noted  . Acute blood loss anemia   . Steroid-induced hyperglycemia   . Acute on chronic anemia   . Seizures (HCC) [place] Spastic hemiparesis (HCC) [place] ANCA-associated vasculitis (HCC) [place] Hypertension   . Thrombocytopenia (HCC) [place] Acute unilateral cerebral infarction in a watershed distribution (HCC)[facility]03/2020  . Weakness   . CVA (cerebral vascular accident) (HCC) [facility]31/2020  . Malnutrition of moderate degree 07/31/2018  . Elevated serum protein level   . Multiple myeloma (HCC) [place] Pancytopenia (HCC) [place] Normocytic normochromic anemia 07/16/2018  . Acute kidney failure (HCC) [place]04/2020  . NICM (nonischemic cardiomyopathy) (HCC) [place]02/2015  . History of ETOH abuse 11/12/2013  . Chronic combined systolic and diastolic CHF (congestive heart failure) (HCC) [place]02/2015  . Congestive dilated cardiomyopathy (HCC) [place]24/2015  . Malignant hypertension     EmilyElecta Sniff DPT, NCS 03/05/2019, 10:41 AM  [id number]: [phone]  Fax:  [phone]  Name: Jeremy Grant MRN: [id number] Date of Birth: 1961/04/11

## 2019-03-05 NOTE — Patient Instructions (Signed)
Access Code: [email or web address]  URL: [email or web address]  Date: 03/05/2019  Prepared by: Cherly Anderson   Exercises Seated Hamstring Stretch with Strap - 4 reps - 1 sets - 1 min hold - 3x daily - 7x weekly Supine Short Arc Quad - 10 reps - 1 sets - 2x daily - 7x weekly Supine Bridge with Pelvic Floor Contraction on Swiss Ball - 10 reps - 1 sets - 2x daily - 7x weekly Supine Ankle Dorsiflexion Stretch with Caregiver - 4 reps - 1 sets - 1 min hold - 3x daily - 7x weekly Clamshell - 5 reps - 2 sets - 1x daily - 7x weekly Seated Knee Flexion - 5 reps - 3 sets - 1x daily - 7x weekly

## 2019-03-10 ENCOUNTER — Other Ambulatory Visit: Payer: Self-pay

## 2019-03-10 ENCOUNTER — Ambulatory Visit: Payer: BC Managed Care – PPO

## 2019-03-10 DIAGNOSIS — R2689 Other abnormalities of gait and mobility: Secondary | ICD-10-CM | POA: Diagnosis not present

## 2019-03-10 DIAGNOSIS — I69354 Hemiplegia and hemiparesis following cerebral infarction affecting left non-dominant side: Secondary | ICD-10-CM

## 2019-03-10 DIAGNOSIS — M6281 Muscle weakness (generalized): Secondary | ICD-10-CM

## 2019-03-10 NOTE — Therapy (Signed)
[facility] [address] [place], [place], [address] Phone: [phone]   Fax:  [phone]  Physical Therapy Treatment  Patient Details  Name: Jeremy Grant MRN: [id number] Date of Birth: 1960/12/22 Referring Provider (PT): Dr. Letta Pate   Encounter Date: 03/10/2019  PT End of Session - 03/10/19 0935    Visit Number  7    Number of Visits  16    Date for PT Re-Evaluation  05/14/19    Authorization Type  BCBS    PT Start Time  0933    PT Stop Time  1015    PT Time Calculation (min)  42 min    Equipment Utilized During Treatment  Gait belt   pt had his own gait belt, wore left AFO and sweedish knee cage of his   Activity Tolerance  Patient tolerated treatment well    Behavior During Therapy  [facility] for tasks assessed/performed       Past Medical History:  Diagnosis Date  . Arthritis   . CHF (congestive heart failure) ([place])   . Combined systolic and diastolic cardiac dysfunction    Echo 11/03/2013 EF 74%, grade 3 diastolic dysfunction  . Hypertension   . NICM (nonischemic cardiomyopathy) ([place])    a. L/RHC (11/05/13): RA: 3, RV 52/5, PA 49/19 (31), PCWP 10, AO 166/93, PA 67%, Fick CO/CI: 5.71/2.97, Lmain: normal, LAD: large, without signficant dz, first diagonal has 20% dz at ostium, LCx: normal, RCA: 30% stenosis at the bifurcation of PDA and PLOM    Past Surgical History:  Procedure Laterality Date  . BIOPSY  01/31/2019   Procedure: BIOPSY;  Surgeon: Otis Brace, MD;  Location: [facility] ENDOSCOPY;  Service: Gastroenterology;;  . ESOPHAGOGASTRODUODENOSCOPY N/A 01/31/2019   Procedure: ESOPHAGOGASTRODUODENOSCOPY (EGD);  Surgeon: Otis Brace, MD;  Location: [facility] ENDOSCOPY;  Service: Gastroenterology;  Laterality: N/A;  . IR FLUORO GUIDE CV LINE RIGHT  07/25/2018  . IR US GUIDE VASC ACCESS RIGHT  07/25/2018  . LEFT AND RIGHT HEART CATHETERIZATION WITH CORONARY ANGIOGRAM N/A 11/05/2013   Procedure: LEFT AND RIGHT  HEART CATHETERIZATION WITH CORONARY ANGIOGRAM;  Surgeon: Peter M Martinique, MD;  Location: [facility] CATH LAB;  Service: Cardiovascular;  Laterality: N/A;  . RIGHT HEART CATHETERIZATION N/A 01/14/2014   Procedure: RIGHT HEART CATH;  Surgeon: Larey Dresser, MD;  Location: [facility] CATH LAB;  Service: Cardiovascular;  Laterality: N/A;    There were no vitals filed for this visit.  Subjective Assessment - 03/10/19 0934    Subjective  Pt has no new reports other than saw PCP yesterday. BP still running a little high. Pt goes to neurologist tomorrow.    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 8/31-9/3 then inpatient rehab 9/3-9/23.PMH: CHF, HTN, hyperlipidemia, glomerulonephritis, pancytopenia/chronic anemia, seizure left-sided post stroke    Patient Stated Goals  Pt wants to be able to walk again.    Currently in Pain?  No/denies    Pain Onset  1 to 4 weeks ago                       [facility] Adult PT Treatment/Exercise - 03/10/19 0950      Ambulation/Gait   Ambulation/Gait  Yes    Ambulation/Gait Assistance  5: Supervision;4: Min assist    Ambulation/Gait Assistance Details  supervision with walker and min assist with SPC with verbal cues for sequencing    Ambulation Distance (Feet)  230  Feet   115 with walker at beginning of session   Assistive device  Rolling walker;Straight cane    Gait Pattern  Step-through pattern    Ambulation Surface  Level;Indoor    Gait Comments  Pt had some left knee recurvatum towards end of walking with walker at beginning of session. Pt ambulated with cane after exercises focusing on hamstring and had improved left knee control. BP=178/82 after gait. After resting 5 min BP=152/86      Neuro Re-ed    Neuro Re-ed Details   Step-ups on 4" step with left LE x 10 with minimal right UE support the last 5.      Exercises   Exercises  Other Exercises    Other Exercises   Prone  left hamstring curl x 10 min assist with muscle tapping to help initiate hamstring contraction. Pt able to assist through more of range today. Supine left hamstring curl on red theraband x 10 with PT stabilizing ball. Supine bilateral LE bridging on red theraball 10 x 2 with PT assisting to stabilize ball and verbal cues to try to raise left hip more to equalize pelvis.              PT Education - 03/10/19 1120    Education Details  Added prone hamstring curls with wife assist to HEP    Person(s) Educated  Patient;Spouse    Methods  Explanation;Demonstration;Handout    Comprehension  Verbalized understanding       PT Short Term Goals - 03/03/19 1305      PT SHORT TERM GOAL #1   Title  Pt will be able to perform initial HEP with assist of wife to continue strength, balance and mobility gains on own.    Time  4    Period  Weeks    Status  New    Target Date  03/20/19      PT SHORT TERM GOAL #2   Title  Pt will ambulate 100' with FWW CGA for improved household mobility.    Baseline  Pt ambulated 230' with FWW CGA with sweedish knee cage and left AFO donned.    Time  4    Period  Weeks    Status  Achieved    Target Date  03/20/19      PT SHORT TERM GOAL #3   Title  Pt will be able to maintain standing with minimal UE support x 5 min for improved balance and to assist with ADLs.    Time  4    Period  Weeks    Status  New    Target Date  03/20/19      PT SHORT TERM GOAL #4   Title  Pt will be independent with transfers from varied surfaces for improved mobility at home.    Time  4    Period  Weeks    Status  New    Target Date  03/20/19        PT Long Term Goals - 03/03/19 1306      PT LONG TERM GOAL #1   Title  Pt will be able to ambulate >500' with FWW independently for short community distances.    Time  7    Period  Weeks    Status  New      PT LONG TERM GOAL #2   Title  Pt will decrease TUG from 28 sec to <18 sec for improved balance and decreased fall  risk.  Baseline  28 sec on 02/18/19    Time  7    Period  Weeks    Status  New      PT LONG TERM GOAL #3   Title  Pt will increase gait speed from from 0.169ms to >0.8 m/s for improved gait safety.    Baseline  0.839m on 03/03/19    Time  7    Period  Weeks    Status  Achieved      PT LONG TERM GOAL #4   Title  Pt will be independent with progressive HEP for strength, balance and gait to continue gains on own.    Time  7    Period  Weeks    Status  New      PT LONG TERM GOAL #5   Title  Pt will increase gait speed from 0.89869mto >1.69m/71mor improved gait safety.    Baseline  0.9m/22m 10/20 20    Time  6    Period  Weeks    Status  New    Target Date  04/08/19            Plan - 03/10/19 1121    Clinical Impression Statement  Pt continues to show improved left knee control after activities in session especially with more focus on hamstring activation. Pt able to increase gait distance with SPC today. BP running a little higher at end of session with no other signs of concern.    PT Frequency  Other (comment)    PT Duration  Other (comment)    PT Next Visit Plan  Monitor BP with treatment. Continue left hamstring strengthening, weight shifting/step acitivities, gait with SPC, standing balance.    Consulted and Agree with Plan of Care  Patient       Patient will benefit from skilled therapeutic intervention in order to improve the following deficits and impairments:     Visit Diagnosis: Other abnormalities of gait and mobility  Muscle weakness (generalized)  Hemiplegia and hemiparesis following cerebral infarction affecting left non-dominant side (HCC)[facility] Problem List Patient Active Problem List   Diagnosis Date Noted  . Acute blood loss anemia   . Steroid-induced hyperglycemia   . Acute on chronic anemia   . Seizures (HCC) [place] Spastic hemiparesis (HCC) [place] ANCA-associated vasculitis (HCC) [place] Hypertension   . Thrombocytopenia (HCC) [place] Acute  unilateral cerebral infarction in a watershed distribution (HCC)[facility]  . Weakness   . CVA (cerebral vascular accident) (HCC) [place]31/2020  . Malnutrition of moderate degree 07/31/2018  . Elevated serum protein level   . Multiple myeloma (HCC) [place] Pancytopenia (HCC) [place] Normocytic normochromic anemia 07/16/2018  . Acute kidney failure (HCC) [place]04/2020  . NICM (nonischemic cardiomyopathy) (HCC) [place]02/2015  . History of ETOH abuse 11/12/2013  . Chronic combined systolic and diastolic CHF (congestive heart failure) (HCC) [place]02/2015  . Congestive dilated cardiomyopathy (HCC) [place]24/2015  . Malignant hypertension     EmilyElecta Sniff DPT, NCS 03/10/2019, 11:23 AM  [id number]: [phone]:  [phone]: DeweyJAYMISON Grant [phone] of Birth: 05/02/1961-10-19

## 2019-03-10 NOTE — Patient Instructions (Signed)
Access Code: [email or web address]  URL: [email or web address]  Date: 03/10/2019  Prepared by: Cherly Anderson   Exercises Seated Hamstring Stretch with Strap - 4 reps - 1 sets - 1 min hold - 3x daily - 7x weekly Supine Short Arc Quad - 10 reps - 1 sets - 2x daily - 7x weekly Supine Bridge with Pelvic Floor Contraction on Swiss Ball - 10 reps - 1 sets - 2x daily - 7x weekly Supine Ankle Dorsiflexion Stretch with Caregiver - 4 reps - 1 sets - 1 min hold - 3x daily - 7x weekly Clamshell - 5 reps - 2 sets - 1x daily - 7x weekly Seated Knee Flexion - 5 reps - 3 sets - 1x daily - 7x weekly Prone Knee Flexion - 10 reps - 2 sets - 1x daily - 5x weekly

## 2019-03-11 ENCOUNTER — Ambulatory Visit (INDEPENDENT_AMBULATORY_CARE_PROVIDER_SITE_OTHER): Payer: BC Managed Care – PPO | Admitting: Adult Health

## 2019-03-11 ENCOUNTER — Encounter: Payer: Self-pay | Admitting: Adult Health

## 2019-03-11 VITALS — BP 124/72 | HR 69 | Temp 97.5°F | Ht 73.0 in | Wt 145.4 lb

## 2019-03-11 DIAGNOSIS — Z79899 Other long term (current) drug therapy: Secondary | ICD-10-CM | POA: Diagnosis not present

## 2019-03-11 DIAGNOSIS — I10 Essential (primary) hypertension: Secondary | ICD-10-CM

## 2019-03-11 DIAGNOSIS — R569 Unspecified convulsions: Secondary | ICD-10-CM

## 2019-03-11 DIAGNOSIS — E785 Hyperlipidemia, unspecified: Secondary | ICD-10-CM

## 2019-03-11 DIAGNOSIS — I6389 Other cerebral infarction: Secondary | ICD-10-CM | POA: Diagnosis not present

## 2019-03-11 NOTE — Progress Notes (Signed)
[facility] [address] [place]. [place] [address] [phone]       HOSPITAL FOLLOW UP NOTE  [name] Date of Birth:  1960/11/09 Medical Record Number:  [id number]   Reason for Referral:  hospital stroke follow up    CHIEF COMPLAINT:  Chief Complaint  Patient presents with   Hospitalization Follow-up    Wife present. Treatment room. Patient's wife would like to discuss his Depakote.     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  History obtained from patient, wife and chart review. Reviewed all radiology images and labs personally.  [name] is a 58 y.o. male with history of nonischemic cardiomyopathy, hypertension, combination of systolic and diastolic cardiac dysfunction, congestive heart failure and arthritis presented on 01/12/2019 with L leg weakness.  Stroke work-up showed patchy right corpus callosum infarct with possible watershed secondary to unknown source.  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  MRA showed right ICA stenosis and pericallosal region (questionable emboli).  Carotid Dopplers show bilateral ICA 1 to 39% stenosis in VAs antegrade.  2D echo EF of 55% without cardiac source of embolus identified.  TCD no evidence of PFO.  Given pancytopenia, he is not a good AC candidate therefore TEE/loop not considered at that time.  Recommended 30-day cardiac event monitor outpatient.  LDL 122.  A1c 5.4.  Initiated DAPT for 3 weeks and aspirin alone.  HTN stable.  Initiated atorvastatin 80 mg daily for HLD and secondary stroke prevention.  No evidence or history of DM.  Other stroke risk factors include EtOH use, CAD, systolic and diastolic CHF, nonischemic  cardiomyopathy and prior stroke on imaging.  Other active problems include glomerulonephritis, pancytopenia, shingles in May with postherpetic neuralgia now resolved as well as lumbar spinal disease.  Residual deficits of left hemiparesis and discharged to CIR for ongoing therapies on 01/15/2019.  During CIR admission, Plavix discontinued as well as Lovenox due to excessive bruising with bouts of anemia requiring transfusions.  Underwent EGD on 01/31/2019 with no active bleeding but did show nonbleeding distal esophageal ulcer as well as erosive gastropathy and recommended follow-up with GI outpatient.  He also had bouts of seizures with follow-up EEG negative and initiated Depakote 1000 mg twice daily and Keppra 500 mg twice daily.  He was discharged home with recommendation of outpatient therapies on 02/04/2019.  Jeremy Grant is a 58 year old male who is being seen today for hospital follow-up.  Residual deficits of spastic left hemiparesis and continues to work with outpatient PT with reports of improvement.  He is able to ambulate with a cane but will use wheelchair for long distance.  He will occasionally have LLE spasms and continues on tizanidine with benefit.  No reoccurring seizure activity with ongoing use of Depakote 1000 mg twice daily and Keppra 500 mg twice daily.  Tolerating well without side effects.  Continues on aspirin 81 mg with mild bruising but no bleeding.  Continues on atorvastatin 80 mg without myalgias.  Blood pressure today 124/72.  He continues to follow with Dr. Hollie Salk at [facility] with recent visit last week and initiated carvedilol due to continued elevated blood pressures.  He does not routinely monitor at home.  30-day cardiac event ordered but has not been  completed at this time.  No further concerns at this time.    ROS:   14 system review of systems performed and negative with exception of weakness, pain  PMH:  Past Medical History:  Diagnosis Date    Arthritis    CHF (congestive heart failure) (HCC)    Combined systolic and diastolic cardiac dysfunction    Echo 11/03/2013 EF 25%, grade 3 diastolic dysfunction   Hypertension    NICM (nonischemic cardiomyopathy) ([place])    a. L/RHC (11/05/13): RA: 3, RV 52/5, PA 49/19 (31), PCWP 10, AO 166/93, PA 67%, Fick CO/CI: 5.71/2.97, Lmain: normal, LAD: large, without signficant dz, first diagonal has 20% dz at ostium, LCx: normal, RCA: 30% stenosis at the bifurcation of PDA and PLOM    PSH:  Past Surgical History:  Procedure Laterality Date   BIOPSY  01/31/2019   Procedure: BIOPSY;  Surgeon: Otis Brace, MD;  Location: [place] [facility];  Service: Gastroenterology;;   ESOPHAGOGASTRODUODENOSCOPY N/A 01/31/2019   Procedure: ESOPHAGOGASTRODUODENOSCOPY (EGD);  Surgeon: Otis Brace, MD;  Location: [facility] ENDOSCOPY;  Service: Gastroenterology;  Laterality: N/A;   IR FLUORO GUIDE CV LINE RIGHT  07/25/2018   IR US GUIDE VASC ACCESS RIGHT  07/25/2018   LEFT AND RIGHT HEART CATHETERIZATION WITH CORONARY ANGIOGRAM N/A 11/05/2013   Procedure: LEFT AND RIGHT HEART CATHETERIZATION WITH CORONARY ANGIOGRAM;  Surgeon: Peter M Martinique, MD;  Location[facility] CATH LAB;  Service: Cardiovascular;  Laterality: N/A;   RIGHT HEART CATHETERIZATION N/A 01/14/2014   Procedure: RIGHT HEART CATH;  Surgeon: Larey Dresser, MD;  Location: [facility] CATH LAB;  Service: Cardiovascular;  Laterality: N/A;    Social History:  Social History   Socioeconomic History   Marital status: Married    Spouse name: BRENDA   Number of children: 0   Years of education: 12   Highest education level: 12th grade  Occupational History   Occupation: Counselling psychologist strain: Not on file   Food insecurity    Worry: Never true    Inability: Never true   Transportation needs    Medical: No    Non-medical: No  Tobacco Use   Smoking status: Never Smoker   Smokeless tobacco: Never Used  Substance and  Sexual Activity   Alcohol use: Yes    Alcohol/week: 3.0 standard drinks    Types: 3 Glasses of wine per week   Drug use: No   Sexual activity: Not Currently    Birth control/protection: None  Lifestyle   Physical activity    Days per week: 5 days    Minutes per session: 30 min   Stress: Not at all  Relationships   Social connections    Talks on phone: More than three times a week    Gets together: Twice a week    Attends religious service: Never    Active member of club or organization: No    Attends meetings of clubs or organizations: Never    Relationship status: Married   Intimate partner violence    Fear of current or ex partner: No    Emotionally abused: No    Physically abused: No    Forced sexual activity: No  Other Topics Concern   Not on file  Social History Narrative   Not on file    Family History:  Family History  Problem Relation Age of Onset   Heart attack Father    Heart disease Father    Arrhythmia Father  Hypertension Brother    Hypertension Brother     Medications:   Current Outpatient Medications on File Prior to Visit  Medication Sig Dispense Refill   acetaminophen (TYLENOL) 325 MG tablet Take 2 tablets (650 mg total) by mouth every 6 (six) hours as needed for moderate pain.     amLODipine (NORVASC) 10 MG tablet Take 1 tablet (10 mg total) by mouth daily. 30 tablet 1   aspirin EC 81 MG EC tablet Take 1 tablet (81 mg total) by mouth daily.     atorvastatin (LIPITOR) 80 MG tablet Take 1 tablet (80 mg total) by mouth daily at 6 PM. 30 tablet 0   azaTHIOprine (IMURAN) 50 MG tablet Take 1 tablet (50 mg total) by mouth daily. 30 tablet 1   carvedilol (COREG) 6.25 MG tablet Take 6.25 mg by mouth 2 (two) times daily with a meal.     divalproex (DEPAKOTE) 500 MG DR tablet Take 2 tablets (1,000 mg total) by mouth every 12 (twelve) hours. 120 tablet 1   levETIRAcetam (KEPPRA) 500 MG tablet Take 1 tablet (500 mg total) by mouth 2  (two) times daily. 60 tablet 1   pantoprazole (PROTONIX) 40 MG tablet Take 1 tablet (40 mg total) by mouth daily. 30 tablet 0   tiZANidine (ZANAFLEX) 2 MG tablet Take 1 tablet (2 mg total) by mouth at bedtime. 30 tablet 1   No current facility-administered medications on file prior to visit.     Allergies:   Allergies  Allergen Reactions   Hydralazine Hcl      Physical Exam  Vitals:   03/11/19 1004  BP: 124/72  Pulse: 69  Temp: (!) 97.5 F (36.4 C)  TempSrc: Oral  Weight: 145 lb 6.4 oz (66 kg)  Height: 6\' 1"  (1.854 m)   Body mass index is 19.18 kg/m. No exam data present  General: well developed, well nourished,  pleasant middle-age Caucasian male, seated, in no evident distress Head: head normocephalic and atraumatic.   Neck: supple with no carotid or supraclavicular bruits Cardiovascular: regular rate and rhythm, no murmurs Musculoskeletal: no deformity Skin:  no rash/petichiae Vascular:  Normal pulses all extremities   Neurologic Exam Mental Status: Awake and fully alert. Oriented to place and time. Recent and remote memory intact. Attention span, concentration and fund of knowledge appropriate. Mood and affect appropriate.  Cranial Nerves: Fundoscopic exam reveals sharp disc margins. Pupils equal, briskly reactive to light. Extraocular movements full without nystagmus. Visual fields full to confrontation. Hearing intact. Facial sensation intact. Face, tongue, palate moves normally and symmetrically.  Motor:  LUE: 5/5 with decreased hand dexterity LLE: 4/5 with weak ankle dorsiflexion and AFO in place with increased spasticity Full strength right upper and lower extremity Sensory.: intact to touch , pinprick , position and vibratory sensation.  Coordination: Rapid alternating movements slightly diminished left hand. Finger-to-nose and heel-to-shin performed accurately bilaterally. Gait and Station: Gait assessment deferred Reflexes: 1+ and symmetric. Toes  downgoing.     NIHSS  0 Modified Rankin  3    Diagnostic Data (Labs, Imaging, Testing)  MR BRAIN WO CONTRAST 01/12/2019 IMPRESSION: Patchy areas of acute infarct in the deep white matter on the right including the corpus callosum. Possible watershed type infarct. Chronic microvascular ischemic changes in the white matter.  MR LUMBAR SPINE WO CONTRAST 01/12/2019 IMPRESSION: 1. Grade 1 anterolisthesis of L4 on L5. 2. Lumbar spine spondylosis most notable at L4-L5 with a central disc protrusion contacting the bilateral descending L5 nerve roots and large  bulky right-sided osteophytes which causes effacement of the thecal sac. There is moderate bilateral neural foraminal narrowing and mild central canal stenosis at this level. 3. Also at L5-S1 there is a central disc protrusion with annular fissure and the disc protrusion contacts the right S1 nerve root.  MR MRA HEAD  01/12/2019 IMPRESSION: Abnormal appearance of the right anterior cerebral artery in the pericallosal region most likely representing stenoses secondary to emboli.  ECHOCARDIOGRAM 01/12/2019 IMPRESSIONS  1. The left ventricle has a visually estimated ejection fraction of 55%. The cavity size was normal. There is mildly increased left ventricular wall thickness. Left ventricular diastolic Doppler parameters are consistent with impaired relaxation.  2. The aortic valve is tricuspid. Mild calcification of the aortic valve. No stenosis of the aortic valve.  3. The aortic root is normal in size and structure.  4. The right ventricle has normal systolic function. The cavity was normal. There is no increase in right ventricular wall thickness.  5. No evidence of mitral valve stenosis. No significant mitral regurgitation.  6. Normal IVC size. No complete TR doppler jet so unable to estimate PA systolic pressure. =]Normal  EEG adult 01/21/2019 IMPRESSION: This study is within normal limits. However, only wakefulness and  drowsiness were recorded. If suspicion for interictal activity remains a concern, a prolonged study including sleep should be considered.  No seizures or epileptiform discharges were seen throughout the recording.       ASSESSMENT: Jeremy Grant is a 58 y.o. 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  MRI also showed evidence of previous small subacute left frontal and chronic infarcts likely embolic etiology.  Recent shingles which shingles vasculitis consideration but left-sided subacute on chronic infarcts predated his onset of shingles hence unlikely to be caused.  Chronic pancytopenia with low platelet count and anemia and likely not a good candidate for anticoagulation.  Possible consideration of 30-day cardiac event monitor outpatient.  During CIR admission, evidence of partial seizure activity and initiated Depakote and Keppra for seizure prophylaxis.  Vascular risk factors include HTN, HLD, EtOH use, CAD, systolic and diastolic CHF, nonischemic cardiomyopathy, pancytopenia and prior stroke on imaging.  Residual deficits of mild left hemiparesis but overall improving    PLAN:  1. Right CR infarct: Continue aspirin 81 mg daily  and atorvastatin for secondary stroke prevention. Maintain strict control of hypertension with blood pressure goal below 130/90, diabetes with hemoglobin A1c goal below 6.5% and cholesterol with LDL cholesterol (bad cholesterol) goal below 70 mg/dL.  I also advised the patient to eat a healthy diet with plenty of whole grains, cereals, fruits and vegetables, exercise regularly with at least 30 minutes of continuous activity daily and maintain ideal body weight.  Provided wife with cardiology office number for 30-day cardiac event monitor.  Discussion regarding contraindication of anticoagulation with history of pancytopenia but this can be further discussed if  diagnosis of A. fib found 2. HTN: Advised to continue current treatment regimen.  Today's BP stable.  Advised to continue to monitor at home along with continued follow-up with PCP for management 3. HLD: Advised to continue current treatment regimen along with continued follow-up with PCP for future prescribing and monitoring of lipid panel 4. Seizures: Continue Depakote 1000 mg twice daily and Keppra 500 mg twice daily.  Obtain Depakote level.  Discussion regarding continued use of seizure prophylaxis medication to prevent further events. 5. Spastic left hemiparesis: Continue  to work with outpatient therapy and ongoing use of tizanidine for spasticity pain     Follow up in 3 months or call earlier if needed   Greater than 50% of time during this 45 minute visit was spent on counseling, explanation of diagnosis of right CR infarct, reviewing risk factor management of HTN, HLD, discussion regarding seizures post stroke and residual deficits, planning of further management along with potential future management, and discussion with patient and family answering all questions.    Frann Rider, AGNP-BC  [facility] [address] [place], [place] [address]  Phone [phone] Fax [phone] Note: This document was prepared with digital dictation and possible smart phrase technology. Any transcriptional errors that result from this process are unintentional.

## 2019-03-11 NOTE — Patient Instructions (Signed)
Continue aspirin 81 mg daily  and atorvastatin 80 mg daily for secondary stroke prevention  Continue to follow up with PCP regarding cholesterol and blood pressure management   We will check depakote level today  - continue current dosage of depakote and keppra for now  Please call cardiology to receive cardiac monitor [phone]  Continue to monitor blood pressure at home  Maintain strict control of hypertension with blood pressure goal below 130/90, diabetes with hemoglobin A1c goal below 6.5% and cholesterol with LDL cholesterol (bad cholesterol) goal below 70 mg/dL. I also advised the patient to eat a healthy diet with plenty of whole grains, cereals, fruits and vegetables, exercise regularly and maintain ideal body weight.  Followup in the future with me in 3 months or call earlier if needed       Thank you for coming to see [place] at [facility]. I hope we have been able to provide you high quality care today.  You may receive a patient satisfaction survey over the next few weeks. We would appreciate your feedback and comments so that we may continue to improve ourselves and the health of our patients.

## 2019-03-12 ENCOUNTER — Other Ambulatory Visit: Payer: Self-pay

## 2019-03-12 ENCOUNTER — Ambulatory Visit: Payer: BC Managed Care – PPO

## 2019-03-12 DIAGNOSIS — R2689 Other abnormalities of gait and mobility: Secondary | ICD-10-CM | POA: Diagnosis not present

## 2019-03-12 DIAGNOSIS — M6281 Muscle weakness (generalized): Secondary | ICD-10-CM

## 2019-03-12 LAB — VALPROIC ACID LEVEL: Valproic Acid Lvl: 90 ug/mL (ref 50–100)

## 2019-03-12 NOTE — Progress Notes (Signed)
I agree with the above plan 

## 2019-03-12 NOTE — Therapy (Signed)
[place] [address] [place], [place], [address] Phone: [phone]   Fax:  [phone]  Physical Therapy Treatment  Patient Details  Name: Jeremy Grant MRN: [id number] Date of Birth: April 04, 1961 Referring Provider (PT): Dr. Letta Pate   Encounter Date: 03/12/2019  PT End of Session - 03/12/19 0847    Visit Number  8    Number of Visits  16    Date for PT Re-Evaluation  05/14/19    Authorization Type  BCBS    PT Start Time  0845    PT Stop Time  0928    PT Time Calculation (min)  43 min    Equipment Utilized During Treatment  Gait belt   pt had his own gait belt, wore left AFO and sweedish knee cage of his   Activity Tolerance  Patient tolerated treatment well    Behavior During Therapy  [facility] for tasks assessed/performed       Past Medical History:  Diagnosis Date  . Arthritis   . CHF (congestive heart failure) ([place])   . Combined systolic and diastolic cardiac dysfunction    Echo 11/03/2013 EF 80%, grade 3 diastolic dysfunction  . Hypertension   . NICM (nonischemic cardiomyopathy) ([place])    a. L/RHC (11/05/13): RA: 3, RV 52/5, PA 49/19 (31), PCWP 10, AO 166/93, PA 67%, Fick CO/CI: 5.71/2.97, Lmain: normal, LAD: large, without signficant dz, first diagonal has 20% dz at ostium, LCx: normal, RCA: 30% stenosis at the bifurcation of PDA and PLOM    Past Surgical History:  Procedure Laterality Date  . BIOPSY  01/31/2019   Procedure: BIOPSY;  Surgeon: Otis Brace, MD;  Location: [place] [facility] ENDOSCOPY;  Service: Gastroenterology;;  . ESOPHAGOGASTRODUODENOSCOPY N/A 01/31/2019   Procedure: ESOPHAGOGASTRODUODENOSCOPY (EGD);  Surgeon: Otis Brace, MD;  Location: [facility] ENDOSCOPY;  Service: Gastroenterology;  Laterality: N/A;  . IR FLUORO GUIDE CV LINE RIGHT  07/25/2018  . IR US GUIDE VASC ACCESS RIGHT  07/25/2018  . LEFT AND RIGHT HEART CATHETERIZATION WITH CORONARY ANGIOGRAM N/A 11/05/2013   Procedure: LEFT AND RIGHT  HEART CATHETERIZATION WITH CORONARY ANGIOGRAM;  Surgeon: Peter M Martinique, MD;  Location: [facility] CATH LAB;  Service: Cardiovascular;  Laterality: N/A;  . RIGHT HEART CATHETERIZATION N/A 01/14/2014   Procedure: RIGHT HEART CATH;  Surgeon: Larey Dresser, MD;  Location: [facility] CATH LAB;  Service: Cardiovascular;  Laterality: N/A;    There were no vitals filed for this visit.  Subjective Assessment - 03/12/19 0846    Subjective  Pt reports he is doing well. He has been getting up to bathroom on his own. Pt reports that his left lateral ankle area was sore after getting back from neurologist. They checked his blood work to monitor seizure med levels.    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 8/31-9/3 then inpatient rehab 9/3-9/23.PMH: CHF, HTN, hyperlipidemia, glomerulonephritis, pancytopenia/chronic anemia, seizure left-sided post stroke    Patient Stated Goals  Pt wants to be able to walk again.    Currently in Pain?  No/denies    Pain Onset  1 to 4 weeks ago                       [facility] Adult PT Treatment/Exercise - 03/12/19 0851      Ambulation/Gait   Ambulation/Gait  Yes    Ambulation/Gait Assistance  4: Min assist    Ambulation Distance (Feet)  230 Feet  230' x 2.   Assistive device  Straight cane   left AFO   Gait Pattern  Step-through pattern;Decreased hip/knee flexion - left    Ambulation Surface  Level;Indoor    Gait Comments  BP=148/86 after gait      Neuro Re-ed    Neuro Re-ed Details   Standing at counter trying to equalize WB through legs without UE support, weight shifting side to side x 10 with PT assisting to faciliate weight shift to left and verbal and tactile cues to try to straighten knee more. Standing with shoulder flexion x 10 bilateral maintaining balance. Repeated on airex with standing 30 sec eyes open then 30 sec eyes closed CGA as increased sway. Weight shifting side  to side on airex. Step-ups on 4" step with left LE 10 x 2 with tactile cuing at knee first bout and then at pelvis to help shift over leg. 1 UE support. Right UE support on counter marching forward and backwards x 3 laps of 6' bouts CGA.      Exercises   Other Exercises   Seated PT bringing left knee into extension and then patient pulling back to try to activate hamstring x 10.             PT Education - 03/12/19 0935    Education Details  Pt to continue with current HEP    Person(s) Educated  Patient    Methods  Explanation    Comprehension  Verbalized understanding       PT Short Term Goals - 03/03/19 1305      PT SHORT TERM GOAL #1   Title  Pt will be able to perform initial HEP with assist of wife to continue strength, balance and mobility gains on own.    Time  4    Period  Weeks    Status  New    Target Date  03/20/19      PT SHORT TERM GOAL #2   Title  Pt will ambulate 100' with FWW CGA for improved household mobility.    Baseline  Pt ambulated 230' with FWW CGA with sweedish knee cage and left AFO donned.    Time  4    Period  Weeks    Status  Achieved    Target Date  03/20/19      PT SHORT TERM GOAL #3   Title  Pt will be able to maintain standing with minimal UE support x 5 min for improved balance and to assist with ADLs.    Time  4    Period  Weeks    Status  New    Target Date  03/20/19      PT SHORT TERM GOAL #4   Title  Pt will be independent with transfers from varied surfaces for improved mobility at home.    Time  4    Period  Weeks    Status  New    Target Date  03/20/19        PT Long Term Goals - 03/03/19 1306      PT LONG TERM GOAL #1   Title  Pt will be able to ambulate >500' with FWW independently for short community distances.    Time  7    Period  Weeks    Status  New      PT LONG TERM GOAL #2   Title  Pt will decrease TUG from 28 sec to <18 sec for improved balance and decreased  fall risk.    Baseline  28 sec on 02/18/19     Time  7    Period  Weeks    Status  New      PT LONG TERM GOAL #3   Title  Pt will increase gait speed from from 0.49ms to >0.8 m/s for improved gait safety.    Baseline  0.839m on 03/03/19    Time  7    Period  Weeks    Status  Achieved      PT LONG TERM GOAL #4   Title  Pt will be independent with progressive HEP for strength, balance and gait to continue gains on own.    Time  7    Period  Weeks    Status  New      PT LONG TERM GOAL #5   Title  Pt will increase gait speed from 0.8925mto >1.91m/46mor improved gait safety.    Baseline  0.52m/51m 10/20 20    Time  6    Period  Weeks    Status  New    Target Date  04/08/19            Plan - 03/12/19 0935    Clinical Impression Statement  Pt showing good carryover between sessions with knee control. No recurvatum with gait today and PT advised they could discontinue sweedish knee cage at home. PT did have to instruct patient to always donn shoes and left AFO when up due to poor ankle control with trace strength at left ankle in all directions for safety.    PT Frequency  Other (comment)    PT Duration  Other (comment)    PT Next Visit Plan  Monitor BP with treatment. Continue left hamstring strengthening, weight shifting/step acitivities, gait with SPC, standing balance.    Consulted and Agree with Plan of Care  Patient       Patient will benefit from skilled therapeutic intervention in order to improve the following deficits and impairments:     Visit Diagnosis: Other abnormalities of gait and mobility  Muscle weakness (generalized)     Problem List Patient Active Problem List   Diagnosis Date Noted  . Acute blood loss anemia   . Steroid-induced hyperglycemia   . Acute on chronic anemia   . Seizures (HCC) [place] Spastic hemiparesis (HCC) [place] ANCA-associated vasculitis (HCC) [place] Hypertension   . Thrombocytopenia (HCC) [place] Acute unilateral cerebral infarction in a watershed distribution (HCC)[facility]03/2020  .  Weakness   . CVA (cerebral vascular accident) (HCC) [place]31/2020  . Malnutrition of moderate degree 07/31/2018  . Elevated serum protein level   . Multiple myeloma (HCC) [place] Pancytopenia (HCC) [place] Normocytic normochromic anemia 07/16/2018  . Acute kidney failure (HCC) [place]04/2020  . NICM (nonischemic cardiomyopathy) (HCC) [place]02/2015  . History of ETOH abuse 11/12/2013  . Chronic combined systolic and diastolic CHF (congestive heart failure) (HCC) [place]02/2015  . Congestive dilated cardiomyopathy (HCC) [place]24/2015  . Malignant hypertension     EmilyElecta Sniff DPT, NCS 03/12/2019, 9:39 AM  [facility]e[place] [id number]: [phone]:  [phone]: DeweySEVASTIAN WITCZAK [phone] of Birth: 12/12/03-02-1961

## 2019-03-16 ENCOUNTER — Ambulatory Visit (HOSPITAL_COMMUNITY)
Admission: RE | Admit: 2019-03-16 | Discharge: 2019-03-16 | Disposition: A | Discharge status: Home / Self Care | Payer: BC Managed Care – PPO | Source: Ambulatory Visit | Attending: Nephrology | Admitting: Nephrology

## 2019-03-16 ENCOUNTER — Other Ambulatory Visit: Payer: Self-pay

## 2019-03-16 VITALS — BP 173/97 | HR 72 | Temp 97.3°F | Resp 20

## 2019-03-16 DIAGNOSIS — N179 Acute kidney failure, unspecified: Secondary | ICD-10-CM | POA: Diagnosis present

## 2019-03-16 LAB — POCT HEMOGLOBIN-HEMACUE: Hemoglobin: 12.1 g/dL — ABNORMAL LOW (ref 13.0–17.0)

## 2019-03-16 LAB — IRON AND TIBC
Iron: 120 ug/dL (ref 45–182)
Saturation Ratios: 44 % — ABNORMAL HIGH (ref 17.9–39.5)
TIBC: 273 ug/dL (ref 250–450)
UIBC: 153 ug/dL

## 2019-03-16 LAB — FERRITIN: Ferritin: 860 ng/mL — ABNORMAL HIGH (ref 24–336)

## 2019-03-16 MED ORDER — EPOETIN ALFA-EPBX 10000 UNIT/ML IJ SOLN: 20000.0000 [IU] | INTRAMUSCULAR | Status: DC

## 2019-03-17 ENCOUNTER — Ambulatory Visit: Payer: BC Managed Care – PPO | Attending: Physical Medicine & Rehabilitation

## 2019-03-17 DIAGNOSIS — M6281 Muscle weakness (generalized): Secondary | ICD-10-CM | POA: Diagnosis present

## 2019-03-17 DIAGNOSIS — I69354 Hemiplegia and hemiparesis following cerebral infarction affecting left non-dominant side: Secondary | ICD-10-CM | POA: Insufficient documentation

## 2019-03-17 DIAGNOSIS — R2689 Other abnormalities of gait and mobility: Secondary | ICD-10-CM | POA: Insufficient documentation

## 2019-03-17 DIAGNOSIS — R2681 Unsteadiness on feet: Secondary | ICD-10-CM | POA: Insufficient documentation

## 2019-03-17 NOTE — Therapy (Signed)
[place] [address] [place], [place], [address] Phone: [phone]   Fax:  [phone]  Physical Therapy Treatment  Patient Details  Name: Jeremy Grant MRN: [id number] Date of Birth: Jul 15, 1960 Referring Provider (PT): Dr. Letta Pate   Encounter Date: 03/17/2019  PT End of Session - 03/17/19 0937    Visit Number  9    Number of Visits  16    Date for PT Re-Evaluation  05/14/19    Authorization Type  BCBS    PT Start Time  [phone]    PT Stop Time  1018    PT Time Calculation (min)  44 min    Equipment Utilized During Treatment  Gait belt   pt had his own gait belt, wore left AFO and sweedish knee cage of his   Activity Tolerance  Patient tolerated treatment well    Behavior During Therapy  [facility] for tasks assessed/performed       Past Medical History:  Diagnosis Date  . Arthritis   . CHF (congestive heart failure) ([place])   . Combined systolic and diastolic cardiac dysfunction    Echo 11/03/2013 EF 91%, grade 3 diastolic dysfunction  . Hypertension   . NICM (nonischemic cardiomyopathy) ([place])    a. L/RHC (11/05/13): RA: 3, RV 52/5, PA 49/19 (31), PCWP 10, AO 166/93, PA 67%, Fick CO/CI: 5.71/2.97, Lmain: normal, LAD: large, without signficant dz, first diagonal has 20% dz at ostium, LCx: normal, RCA: 30% stenosis at the bifurcation of PDA and PLOM    Past Surgical History:  Procedure Laterality Date  . BIOPSY  01/31/2019   Procedure: BIOPSY;  Surgeon: Otis Brace, MD;  Location: [place] ENDOSCOPY;  Service: Gastroenterology;;  . ESOPHAGOGASTRODUODENOSCOPY N/A 01/31/2019   Procedure: ESOPHAGOGASTRODUODENOSCOPY (EGD);  Surgeon: Otis Brace, MD;  Location: [facility] ENDOSCOPY;  Service: Gastroenterology;  Laterality: N/A;  . IR FLUORO GUIDE CV LINE RIGHT  07/25/2018  . IR US GUIDE VASC ACCESS RIGHT  07/25/2018  . LEFT AND RIGHT HEART CATHETERIZATION WITH CORONARY ANGIOGRAM N/A 11/05/2013   Procedure: LEFT AND RIGHT  HEART CATHETERIZATION WITH CORONARY ANGIOGRAM;  Surgeon: Peter M Martinique, MD;  Location: [facility] CATH LAB;  Service: Cardiovascular;  Laterality: N/A;  . RIGHT HEART CATHETERIZATION N/A 01/14/2014   Procedure: RIGHT HEART CATH;  Surgeon: Larey Dresser, MD;  Location: [facility] CATH LAB;  Service: Cardiovascular;  Laterality: N/A;    There were no vitals filed for this visit.                    [place] Adult PT Treatment/Exercise - 03/17/19 0001      Ambulation/Gait   Ambulation/Gait  Yes    Ambulation/Gait Assistance  4: Min guard;4: Min assist    Ambulation/Gait Assistance Details  Verbal cues to relax left arm and upright posture.    Ambulation Distance (Feet)  230 Feet   100 x 1 at end of session   Assistive device  Straight cane    Gait Pattern  Step-through pattern    Ambulation Surface  Level;Indoor      Neuro Re-ed    Neuro Re-ed Details   in // bars: reciprocal steps over 2 yard sticks and golf clubs with right UE support x 4 laps. side stepping over the same objects x 5 laps. Pt initially catching trailing foot but improved with practice. Standing on rockerboard ant/ post 30 sec x 2 then with arm movements up/out/across chest x 10, standing on rockerboard side to side x  30 sec then weight shift side to side x 10. Standing on rockerboard each position maintaining level with playing catch x 3 min each. Gait marching forward in // bars with right UE support then backwards gait x 4 laps. Front and lateral step-ups on 4" step with left LE x 10 each with verbal cues to shift weight over leg with light right UE support. BP=160/88             PT Education - 03/17/19 1320    Education Details  Pt to continue with current HEP    Person(s) Educated  Patient    Methods  Explanation    Comprehension  Verbalized understanding       PT Short Term Goals - 03/03/19 1305      PT SHORT TERM GOAL #1   Title  Pt will be able to perform initial HEP with assist of wife to continue  strength, balance and mobility gains on own.    Time  4    Period  Weeks    Status  New    Target Date  03/20/19      PT SHORT TERM GOAL #2   Title  Pt will ambulate 100' with FWW CGA for improved household mobility.    Baseline  Pt ambulated 230' with FWW CGA with sweedish knee cage and left AFO donned.    Time  4    Period  Weeks    Status  Achieved    Target Date  03/20/19      PT SHORT TERM GOAL #3   Title  Pt will be able to maintain standing with minimal UE support x 5 min for improved balance and to assist with ADLs.    Time  4    Period  Weeks    Status  New    Target Date  03/20/19      PT SHORT TERM GOAL #4   Title  Pt will be independent with transfers from varied surfaces for improved mobility at home.    Time  4    Period  Weeks    Status  New    Target Date  03/20/19        PT Long Term Goals - 03/03/19 1306      PT LONG TERM GOAL #1   Title  Pt will be able to ambulate >500' with FWW independently for short community distances.    Time  7    Period  Weeks    Status  New      PT LONG TERM GOAL #2   Title  Pt will decrease TUG from 28 sec to <18 sec for improved balance and decreased fall risk.    Baseline  28 sec on 02/18/19    Time  7    Period  Weeks    Status  New      PT LONG TERM GOAL #3   Title  Pt will increase gait speed from from 0.40ms to >0.8 m/s for improved gait safety.    Baseline  0.815m on 03/03/19    Time  7    Period  Weeks    Status  Achieved      PT LONG TERM GOAL #4   Title  Pt will be independent with progressive HEP for strength, balance and gait to continue gains on own.    Time  7    Period  Weeks    Status  New  PT LONG TERM GOAL #5   Title  Pt will increase gait speed from 0.61ms to >1.045m for improved gait safety.    Baseline  0.8959mon 10/20 20    Time  6    Period  Weeks    Status  New    Target Date  04/08/19            Plan - 03/17/19 1321    Clinical Impression Statement  Pt continues to  progress gait stability with SPC. Improved with practice on all activities for left LE control.    PT Frequency  Other (comment)    PT Duration  Other (comment)    PT Next Visit Plan  Monitor BP with treatment. Continue left hamstring strengthening, weight shifting/step acitivities, gait with SPC, standing balance. Check STG    Consulted and Agree with Plan of Care  Patient       Patient will benefit from skilled therapeutic intervention in order to improve the following deficits and impairments:     Visit Diagnosis: Other abnormalities of gait and mobility  Muscle weakness (generalized)     Problem List Patient Active Problem List   Diagnosis Date Noted  . Acute blood loss anemia   . Steroid-induced hyperglycemia   . Acute on chronic anemia   . Seizures (HCCPrescott . Spastic hemiparesis (HCCSt. [place] . ANCA-associated vasculitis (HCCGunbarrel . Hypertension   . Thrombocytopenia (HCCCold[place] . Acute unilateral cerebral [facility] in a watershed distribution ([facility]/07/2018  . Weakness   . CVA (cerebral vascular accident) (HCCMaria Antonia8/31/2020  . Malnutrition of moderate degree 07/31/2018  . Elevated serum protein level   . Multiple myeloma (HCCAll[place] . Pancytopenia (HCCMiddlet[place] . Normocytic normochromic anemia 07/16/2018  . Acute kidney failure (HCCBroad Creek3/08/2018  . NICM (nonischemic cardiomyopathy) (HCCBuffalo Gap7/06/2013  . History of ETOH abuse 11/12/2013  . Chronic combined systolic and diastolic CHF (congestive heart failure) (HCCWoodland Heights7/06/2013  . Congestive dilated cardiomyopathy (HCCRodey6/24/2015  . Malignant hypertension     EmiElecta SniffT, DPT, NCS 03/17/2019, 1:24 PM  [place]one: [phone]:  [phone]: DewQUANTRELL SPLITTN: [phone] of Birth: 7/3March 02, 1962

## 2019-03-19 ENCOUNTER — Ambulatory Visit: Payer: BC Managed Care – PPO

## 2019-03-19 ENCOUNTER — Other Ambulatory Visit: Payer: Self-pay

## 2019-03-19 DIAGNOSIS — R2689 Other abnormalities of gait and mobility: Secondary | ICD-10-CM

## 2019-03-19 DIAGNOSIS — M6281 Muscle weakness (generalized): Secondary | ICD-10-CM

## 2019-03-19 NOTE — Therapy (Signed)
[facility] [address] [place], [place], [address] Phone: [phone]   Fax:  [phone]  Physical Therapy Treatment  Patient Details  Name: Jeremy Grant MRN: [id number] Date of Birth: 26-May-1960 Referring Provider (PT): Dr. Letta Pate   Encounter Date: 03/19/2019  PT End of Session - 03/19/19 0936    Visit Number  10    Number of Visits  16    Date for PT Re-Evaluation  05/14/19    Authorization Type  [employer]    PT Start Time  0933    PT Stop Time  1011    PT Time Calculation (min)  38 min    Equipment Utilized During Treatment  Gait belt   pt had his own gait belt, wore left AFO and sweedish knee cage of his   Activity Tolerance  Patient tolerated treatment well    Behavior During Therapy  [facility] for tasks assessed/performed       Past Medical History:  Diagnosis Date  . Arthritis   . CHF (congestive heart failure) ([place])   . Combined systolic and diastolic cardiac dysfunction    Echo 11/03/2013 EF 88%, grade 3 diastolic dysfunction  . Hypertension   . NICM (nonischemic cardiomyopathy) ([place])    a. L/RHC (11/05/13): RA: 3, RV 52/5, PA 49/19 (31), PCWP 10, AO 166/93, PA 67%, Fick CO/CI: 5.71/2.97, Lmain: normal, LAD: large, without signficant dz, first diagonal has 20% dz at ostium, LCx: normal, RCA: 30% stenosis at the bifurcation of PDA and PLOM    Past Surgical History:  Procedure Laterality Date  . BIOPSY  01/31/2019   Procedure: BIOPSY;  Surgeon: Otis Brace, MD;  Location: [facility];  Service: Gastroenterology;;  . ESOPHAGOGASTRODUODENOSCOPY N/A 01/31/2019   Procedure: ESOPHAGOGASTRODUODENOSCOPY (EGD);  Surgeon: Otis Brace, MD;  Location: [facility]COPY;  Service: Gastroenterology;  Laterality: N/A;  . IR FLUORO GUIDE CV LINE RIGHT  07/25/2018  . IR US GUIDE VASC ACCESS RIGHT  07/25/2018  . LEFT AND RIGHT HEART CATHETERIZATION WITH CORONARY ANGIOGRAM N/A 11/05/2013   Procedure: LEFT AND RIGHT  HEART CATHETERIZATION WITH CORONARY ANGIOGRAM;  Surgeon: Peter M Martinique, MD;  Location: [facility] CATH LAB;  Service: Cardiovascular;  Laterality: N/A;  . RIGHT HEART CATHETERIZATION N/A 01/14/2014   Procedure: RIGHT HEART CATH;  Surgeon: Larey Dresser, MD;  Location: [facility] CATH LAB;  Service: Cardiovascular;  Laterality: N/A;    There were no vitals filed for this visit.  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 Walked in with walker to visit today. Reports that he was a little sore after last visit in hips like muscle soreness.    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 8/31-9/3 then inpatient rehab 9/3-9/23.PMH: CHF, HTN, hyperlipidemia, glomerulonephritis, pancytopenia/chronic anemia, seizure left-sided post stroke    Patient Stated Goals  Pt wants to be able to walk again.    Currently in Pain?  No/denies    Pain Onset  1 to 4 weeks ago                       [facility] Adult PT Treatment/Exercise - 03/19/19 0944      Transfers   Transfers  Sit to Stand;Stand to Sit    Sit to Stand  7: Independent    Stand to Sit  7: Independent    Comments  performed from low mat without hands independently with 2 attempts to rise, from chair without armrests with hands independently.       Ambulation/Gait   Ambulation/Gait  Yes    Ambulation/Gait Assistance  4: Min guard    Ambulation/Gait Assistance Details  Verbal cues to relax left arm and increase left hip flexion    Ambulation Distance (Feet)  230 Feet    Assistive device  Straight cane    Gait Pattern  Step-through pattern    Ambulation Surface  Level;Indoor    Gait velocity  0.83 m/s      Neuro Re-ed    Neuro Re-ed Details   Standing x 5 min without UE support with balance activities: eyes closed 30 sec, reaching for targets x 30 sec, weight  shifting side to side x 10, head turn up/down and left right x 10. Dynamic gait activities: stepping over 3 yard sticks in reciprocal pattern then weaving in and out of 4 cones with SPC repeated x 4 laps down and back. Marching gait with SPC x 115' then stop/go with SPC 115'. CGA with activities for safety.  SLS tapping 3 cones x 5 bouts each LE with only SPC support. PT provided CGA. Pt leaning posterior at first on left but able to correct. Pt also improved control with tapping with left as went on. Reported some pain in left posterior hip 3-4/10. Resides pretty quickly when sits. Standing on pillow without UE support x 30 sec yes open and then 30 sec eyes closed. Increased sway with eyes closed with CGA for safety.             PT Education - 03/19/19 1410    Education Details  Pt to continue with current HEP    Person(s) Educated  Patient    Methods  Explanation    Comprehension  Verbalized understanding       PT Short Term Goals - 03/19/19 0938      PT SHORT TERM GOAL #1   Title  Pt will be able to perform initial HEP with assist of wife to continue strength, balance and mobility gains on own.    Baseline  Pt has been working on exercises almost daily unless he has a lot of other activities going on. Wife assisting as needed.    Time  4    Period  Weeks    Status  Achieved    Target Date  03/20/19      PT SHORT TERM GOAL #2   Title  Pt will ambulate 100' with FWW CGA for improved household mobility.    Baseline  Pt ambulated 230' with FWW CGA with sweedish knee cage and left AFO donned.    Time  4    Period  Weeks    Status  Achieved    Target Date  03/20/19      PT SHORT TERM GOAL #3   Title  Pt will be able to maintain standing with minimal UE support x 5 min for improved balance and to assist with ADLs.    Baseline  Pt stood for >5 min without UE support while adding in  balance activities.    Time  4    Period  Weeks    Status  Achieved    Target Date  03/20/19       PT SHORT TERM GOAL #4   Title  Pt will be independent with transfers from varied surfaces  for improved mobility at home.    Time  4    Period  Weeks    Status  Achieved    Target Date  03/20/19        PT Long Term Goals - 03/03/19 1306      PT LONG TERM GOAL #1   Title  Pt will be able to ambulate >500' with FWW independently for short community distances.    Time  7    Period  Weeks    Status  New      PT LONG TERM GOAL #2   Title  Pt will decrease TUG from 28 sec to <18 sec for improved balance and decreased fall risk.    Baseline  28 sec on 02/18/19    Time  7    Period  Weeks    Status  New      PT LONG TERM GOAL #3   Title  Pt will increase gait speed from from 0.85ms to >0.8 m/s for improved gait safety.    Baseline  0.836m on 03/03/19    Time  7    Period  Weeks    Status  Achieved      PT LONG TERM GOAL #4   Title  Pt will be independent with progressive HEP for strength, balance and gait to continue gains on own.    Time  7    Period  Weeks    Status  New      PT LONG TERM GOAL #5   Title  Pt will increase gait speed from 0.8966mto >1.2m/17mor improved gait safety.    Baseline  0.30m/22m 10/20 20    Time  6    Period  Weeks    Status  New    Target Date  04/08/19            Plan - 03/19/19 1412    Clinical Impression Statement  Pt met all STG at visit today with improving balance and gait. Pt continues to progress with gait with SPC at this time.    PT Frequency  Other (comment)    PT Duration  Other (comment)    PT Next Visit Plan  Monitor BP with treatment. Continue left hamstring strengthening, weight shifting/step acitivities, gait with SPC, standing balance.    Consulted and Agree with Plan of Care  Patient       Patient will benefit from skilled therapeutic intervention in order to improve the following deficits and impairments:     Visit Diagnosis: Other abnormalities of gait and mobility  Muscle weakness  (generalized)     Problem List Patient Active Problem List   Diagnosis Date Noted  . Acute blood loss anemia   . Steroid-induced hyperglycemia   . Acute on chronic anemia   . Seizures (HCC) [place] Spastic hemiparesis (HCC) [place] ANCA-associated vasculitis (HCC) [place] Hypertension   . Thrombocytopenia (HCC) [place] Acute unilateral cerebral infarction in a watershed distribution (HCC)[facility]03/2020  . Weakness   . CVA (cerebral vascular accident) (HCC) [place]31/2020  . Malnutrition of moderate degree 07/31/2018  . Elevated serum protein level   . Multiple myeloma (HCC) [place] Pancytopenia (HCC) [place] Normocytic normochromic anemia 07/16/2018  . Acute kidney failure (HCC) [place]04/2020  . NICM (nonischemic cardiomyopathy) (HCC) [place]02/2015  . History of ETOH abuse 11/12/2013  . Chronic combined systolic and diastolic CHF (congestive heart failure) (HCC) [place]02/2015  .  Congestive dilated cardiomyopathy ([place]) 11/04/2013  . Malignant hypertension     Electa Sniff, PT, DPT, NCS 03/19/2019, 2:14 PM  [facility] [address] [place], [place], [address] Phone: [phone]   Fax:  [phone]  Name: Jeremy Grant MRN: [id number] Date of Birth: 1960-09-21

## 2019-03-23 ENCOUNTER — Other Ambulatory Visit: Payer: Self-pay

## 2019-03-23 ENCOUNTER — Encounter: Payer: Self-pay | Admitting: Physical Medicine & Rehabilitation

## 2019-03-23 ENCOUNTER — Encounter
Payer: BC Managed Care – PPO | Attending: Physical Medicine & Rehabilitation | Admitting: Physical Medicine & Rehabilitation

## 2019-03-23 VITALS — BP 198/99 | HR 70 | Temp 97.9°F | Ht 73.0 in | Wt 145.0 lb

## 2019-03-23 DIAGNOSIS — I5042 Chronic combined systolic (congestive) and diastolic (congestive) heart failure: Secondary | ICD-10-CM | POA: Insufficient documentation

## 2019-03-23 DIAGNOSIS — I10 Essential (primary) hypertension: Secondary | ICD-10-CM

## 2019-03-23 DIAGNOSIS — G8110 Spastic hemiplegia affecting unspecified side: Secondary | ICD-10-CM | POA: Insufficient documentation

## 2019-03-23 DIAGNOSIS — I6389 Other cerebral infarction: Secondary | ICD-10-CM | POA: Diagnosis not present

## 2019-03-23 DIAGNOSIS — R569 Unspecified convulsions: Secondary | ICD-10-CM

## 2019-03-23 DIAGNOSIS — R252 Cramp and spasm: Secondary | ICD-10-CM

## 2019-03-23 MED ORDER — TIZANIDINE HCL 4 MG PO TABS: 4.0000 mg | ORAL_TABLET | Freq: Four times a day (QID) | ORAL | 0 refills | Status: DC | PRN

## 2019-03-23 NOTE — Progress Notes (Signed)
Subjective:    Patient ID: Jeremy Grant, male    DOB: 02-02-1961, 58 y.o.   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   Wife provides history. Since discharge, he has been using walker inside/outside the house as well as the wheelchair.  He is doing thing more independently now.  Denies falls. BP is very elevated.  He states it is elevated at home as well, but no this high.  Nephro is making changes to BP meds. He had to cancel GI appointment. He has followed up with Neuro.  Denies further seizure activity. Patient works at [employer].   Therapies: 2/week outpatient  Pain Inventory Average Pain 2 Pain Right Now 0 My pain is aching  In the last 24 hours, has pain interfered with the following? General activity 0 Relation with others 0 Enjoyment of life 0 What TIME of day is your pain at its worst? night Sleep (in general) Good  Pain is worse with: . Pain improves with: medication Relief from Meds: .  Mobility use a walker ability to climb steps?  no do you drive?  no use a wheelchair  Function employed # of hrs/week .  Neuro/Psych weakness  Prior Studies Any changes since last visit?  no  Physicians involved in your care Any changes since last visit?  no   Family History  Problem Relation Age of Onset  . Heart attack Father   . Heart disease Father   . Arrhythmia Father   . Hypertension Brother   . Hypertension Brother    Social History    Socioeconomic History  . Marital status: Married    Spouse name: BRENDA  . Number of children: 0  . Years of education: 24  . Highest education level: 12th grade  Occupational History  . Occupation: Medical sales representative  Social Needs  . Financial resource strain: Not on file  . Food insecurity    Worry: Never true    Inability: Never true  . Transportation needs    Medical: No    Non-medical: No  Tobacco Use  . Smoking status: Never Smoker  . Smokeless tobacco: Never Used  Substance and Sexual Activity  . Alcohol use: Yes    Alcohol/week: 3.0 standard drinks    Types: 3 Glasses of wine per week  . Drug use: No  . Sexual activity: Not Currently    Birth control/protection: None  Lifestyle  . Physical activity    Days per week: 5 days    Minutes per session: 30 min  . Stress: Not at all  Relationships  . Social connections    Talks on phone: More than three times a week    Gets together: Twice a week    Attends religious service: Never    Active member of club or organization: No    Attends meetings of clubs or organizations: Never    Relationship status: Married  Other Topics Concern  . Not on file  Social History Narrative  . Not on file   Past Surgical History:  Procedure Laterality  Date  . BIOPSY  01/31/2019   Procedure: BIOPSY;  Surgeon: Otis Brace, MD;  Location: [facility];  Service: Gastroenterology;;  . ESOPHAGOGASTRODUODENOSCOPY N/A 01/31/2019   Procedure: ESOPHAGOGASTRODUODENOSCOPY (EGD);  Surgeon: Otis Brace, MD;  Location: [facility] ENDOSCOPY;  Service: Gastroenterology;  Laterality: N/A;  . IR FLUORO GUIDE CV LINE RIGHT  07/25/2018  . IR US GUIDE VASC ACCESS RIGHT  07/25/2018  . LEFT AND RIGHT HEART CATHETERIZATION WITH CORONARY ANGIOGRAM N/A 11/05/2013   Procedure: LEFT AND RIGHT HEART CATHETERIZATION WITH CORONARY ANGIOGRAM;  Surgeon: Peter M Martinique, MD;  Location: [facility] CATH LAB;  Service: Cardiovascular;  Laterality: N/A;  . RIGHT HEART CATHETERIZATION  N/A 01/14/2014   Procedure: RIGHT HEART CATH;  Surgeon: Larey Dresser, MD;  Location: [facility] CATH LAB;  Service: Cardiovascular;  Laterality: N/A;   Past Medical History:  Diagnosis Date  . Arthritis   . CHF (congestive heart failure) ([place])   . Combined systolic and diastolic cardiac dysfunction    Echo 11/03/2013 EF 02%, grade 3 diastolic dysfunction  . Hypertension   . NICM (nonischemic cardiomyopathy) ([place])    a. L/RHC (11/05/13): RA: 3, RV 52/5, PA 49/19 (31), PCWP 10, AO 166/93, PA 67%, Fick CO/CI: 5.71/2.97, Lmain: normal, LAD: large, without signficant dz, first diagonal has 20% dz at ostium, LCx: normal, RCA: 30% stenosis at the bifurcation of PDA and PLOM   BP (!) 198/99   Pulse 70   Temp 97.9 F (36.6 C)   Ht 6\' 1"  (1.854 m)   Wt 145 lb (65.8 kg)   SpO2 97%   BMI 19.13 kg/m   Opioid Risk Score:   Fall Risk Score:  `1  Depression screen PHQ 2/9  Depression screen PHQ 2/9 02/17/2019  Decreased Interest 1  Down, Depressed, Hopeless 1  PHQ - 2 Score 2  Altered sleeping 2  Tired, decreased energy 2  Change in appetite 0  Feeling bad or failure about yourself  1  Trouble concentrating 0  Moving slowly or fidgety/restless 0  Suicidal thoughts 0  PHQ-9 Score 7  Difficult doing work/chores Somewhat difficult     Review of Systems  Constitutional: Negative.   HENT: Negative.   Eyes: Negative.   Respiratory: Negative.   Cardiovascular: Negative.   Gastrointestinal: Negative.   Endocrine: Negative.   Genitourinary: Negative.   Musculoskeletal: Positive for arthralgias and gait problem.  Skin: Negative.   Allergic/Immunologic: Negative.   Neurological: Positive for weakness. Negative for numbness.  Hematological: Negative.   Psychiatric/Behavioral: Negative.       Objective:   Physical Exam  Constitutional: No distress . Vital signs reviewed. HENT: Normocephalic.  Atraumatic. Eyes: EOMI. No discharge. Cardiovascular: No JVD. Respiratory: Normal effort.  No  stridor. GI: Non-distended. Skin: Warm and dry.  Intact. Psych: Normal mood.  Normal behavior. Musc: No edema in extremities.  No tenderness in extremities. 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  1.  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    Cont follow up with Neuro  Cont therapies  Wife requests disability forms  2.  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    Cont monitor weights, stable per pt  3.  Hypertension.    Extremely elevated  Cont follow up with Nephro for further adjustments  4.  ANCA necrotizing concentric glomerulonephritis.    Recs per Nephro  5.  Pancytopenia/chronic anemia.    Follow up with GI  6.  Spasms LLE  LLE             Will increase Tizanidine to 4mg   7.  Seizure left-sided post stroke.    Follow up with Neuro  No repeat seizures reported

## 2019-03-24 ENCOUNTER — Ambulatory Visit: Payer: BC Managed Care – PPO

## 2019-03-24 ENCOUNTER — Other Ambulatory Visit: Payer: Self-pay

## 2019-03-24 ENCOUNTER — Ambulatory Visit: Payer: BC Managed Care – PPO | Admitting: Occupational Therapy

## 2019-03-24 DIAGNOSIS — I69354 Hemiplegia and hemiparesis following cerebral infarction affecting left non-dominant side: Secondary | ICD-10-CM

## 2019-03-24 DIAGNOSIS — M6281 Muscle weakness (generalized): Secondary | ICD-10-CM

## 2019-03-24 DIAGNOSIS — R2681 Unsteadiness on feet: Secondary | ICD-10-CM

## 2019-03-24 DIAGNOSIS — R2689 Other abnormalities of gait and mobility: Secondary | ICD-10-CM

## 2019-03-24 NOTE — Therapy (Signed)
[facility] [address] [place], [place], [address] Phone: [phone]   Fax:  [phone]  Occupational Therapy Evaluation  Patient Details  Name: Jeremy Grant MRN: [id number] Date of Birth: February 19, 1961 No data recorded  Encounter Date: 03/24/2019  OT End of Session - 03/24/19 1214    Visit Number  1    Number of Visits  13    Date for OT Re-Evaluation  05/24/19    Authorization Type  BC/BS    OT Start Time  1100    OT Stop Time  1145    OT Time Calculation (min)  45 min    Activity Tolerance  Patient tolerated treatment well    Behavior During Therapy  [facility] for tasks assessed/performed       Past Medical History:  Diagnosis Date  . Arthritis   . CHF (congestive heart failure) ([place])   . Combined systolic and diastolic cardiac dysfunction    Echo 11/03/2013 EF 04%, grade 3 diastolic dysfunction  . Hypertension   . NICM (nonischemic cardiomyopathy) ([place])    a. L/RHC (11/05/13): RA: 3, RV 52/5, PA 49/19 (31), PCWP 10, AO 166/93, PA 67%, Fick CO/CI: 5.71/2.97, Lmain: normal, LAD: large, without signficant dz, first diagonal has 20% dz at ostium, LCx: normal, RCA: 30% stenosis at the bifurcation of PDA and PLOM    Past Surgical History:  Procedure Laterality Date  . BIOPSY  01/31/2019   Procedure: BIOPSY;  Surgeon: Otis Brace, MD;  Location: [facility] ENDOSCOPY;  Service: Gastroenterology;;  . ESOPHAGOGASTRODUODENOSCOPY N/A 01/31/2019   Procedure: ESOPHAGOGASTRODUODENOSCOPY (EGD);  Surgeon: Otis Brace, MD;  Location: [facility] ENDOSCOPY;  Service: Gastroenterology;  Laterality: N/A;  . IR FLUORO GUIDE CV LINE RIGHT  07/25/2018  . IR US GUIDE VASC ACCESS RIGHT  07/25/2018  . LEFT AND RIGHT HEART CATHETERIZATION WITH CORONARY ANGIOGRAM N/A 11/05/2013   Procedure: LEFT AND RIGHT HEART CATHETERIZATION WITH CORONARY ANGIOGRAM;  Surgeon: Peter M Martinique, MD;  Location: [facility] CATH LAB;  Service: Cardiovascular;  Laterality: N/A;   . RIGHT HEART CATHETERIZATION N/A 01/14/2014   Procedure: RIGHT HEART CATH;  Surgeon: Larey Dresser, MD;  Location: [facility] CATH LAB;  Service: Cardiovascular;  Laterality: N/A;    There were no vitals filed for this visit.  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 PMH: acute kidney failure March 2020, Shingles in May 2020, CHF, HTN, CKD(IV), HLD, auto immune disease    Limitations  seizures (on meds now - has not had any since d/c from hospital)    Currently in Pain?  No/denies        [facility] OT Assessment - 03/24/19 0001      Assessment   Medical Diagnosis  CVA with seizures    Onset Date/Surgical Date  01/12/19    Hand Dominance  Right    Prior Therapy  Pt had inpatient rehab with ST, OT, PT (9/3 - 02/04/19)      Precautions   Precautions  Fall;Other (comment)    Precaution Comments  seizures (on meds now)      Balance Screen   Has the patient fallen in the past 6 months  Yes    How many times?  --   currently seeing P.T.      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   3-in-1 commode over toilet   [place]  [place]  Completely unable to shop   partly due to [employer]  Does not participate in any housekeeping tasks   since stroke   Meal Prep  --   some from w/c level   [place] on family or friends for transportation    Medication Management  --   Wife helping for ease     Mobility   Mobility Status  Needs assist    Mobility Status Comments  walker for short distances, w/c some in kitchen      Written Expression   Dominant Hand  Right    Handwriting  --   denies change     Vision - History   Baseline Vision  Wears glasses all the time    Additional Comments  denies changes      Activity Tolerance   Activity Tolerance  --   approx 10 min. without rest     Cognition   Overall Cognitive Status  Within Functional Limits for tasks assessed      Observation/Other Assessments   Observations  uses walker      Sensation   Light Touch  Appears Intact      Coordination   9 Hole Peg Test  Right;Left    Right 9 Hole Peg Test  23.69 SEC    Left 9 Hole Peg Test  25.50 SEC      Edema   Edema  none in UE's      ROM / Strength   AROM / PROM / Strength  AROM;Strength      AROM   Overall AROM Comments  BUE AROM WFL's except end range LUE shoulder (approx 90% flex), however more difficult to maintain d/t weakness      Strength   Overall Strength Comments  RUE grossly 4+/5 from generalized deconditioning. LUE 3+/5 at shoulder in lower ranges, but cannot go into full shoulder flex or abd      Hand Function   Right Hand Grip (lbs)  64 LBS    Left Hand Grip (lbs)  58 LBS                        OT Short Term Goals - 03/24/19 1449      OT SHORT TERM GOAL #1   Title  Independent with LUE HEP    Time  3    Period  Weeks  Status  New      OT SHORT TERM GOAL #2   Title  Grip strength Lt hand to be 60 lbs or greater    Baseline  Lt = 58 lbs (Rt = 64  lbs)    Time  3    Period  Weeks    Status  New      OT SHORT TERM GOAL #3   Title  Pt to stand to fold clothes and wash dishes x 10 min w/o rest    Time  3    Period  Weeks    Status  New      OT SHORT TERM GOAL #4   Title  Pt to consistently go into kitchen and make simple snack/sandwich at distant sup level    Time  3    Period  Weeks    Status  New        OT Long Term Goals - 03/24/19 1452      OT LONG TERM GOAL #1   Title  Independent with updated HEP    Time  6    Period  Weeks    Status  New      OT LONG TERM GOAL #2   Title  Pt able to place 3 lb object on high level shelf LUE 5/5 trials w/ min compensations and no pain    Time  6    Period  Weeks    Status  New      OT LONG TERM GOAL #3   Title  Pt to make simple breakfast mod I level safely using DME prn and fall prevention techniques    Time  6    Period  Weeks    Status  New            Plan - 03/24/19 1434    Clinical Impression Statement  Pt is a 58 y.o. male who presents to outpatient O.T. s/p multiple area infarcts (watershed CVA) on 01/12/19. Pt also had seizures w/ stroke and chronic anemia. Prior to stroke, pt had acute kidney failure in March 2020, and episode of Shingles in May 2020. Pt now presents with overall decreased strength Lt side, decreased endurance, and decreased balance. LUE shoulder weakness present however LE appears more affected than UE. Pt would benefit from O.T. to address these deficits and return patient to performing IADLS.    Occupational performance deficits (Please refer to evaluation for details):  IADL's;Leisure;Social Participation;Work    Marketing executive / Function / Physical Skills  ADL;IADL;Endurance;Body mechanics;Mobility;Strength;Coordination;UE functional use;Pain    Rehab Potential  Good    Clinical Decision Making  Several treatment options, min-mod task modification necessary    Comorbidities Affecting Occupational Performance:  Presence of comorbidities  impacting occupational performance    Comorbidities impacting occupational performance description:  chronic anemia, recent seizures, auto immune dz    Modification or Assistance to Complete Evaluation   Min-Moderate modification of tasks or assist with assess necessary to complete eval    OT Frequency  2x / week    OT Duration  6 weeks   or 12 visits over 8 weeks if pt cannot meet frequency d/t scheduling conflicts   OT Treatment/Interventions  Self-care/ADL training;Therapeutic exercise;Functional Mobility Training;Neuromuscular education;Energy conservation;Therapeutic activities;Coping strategies training;DME and/or AE instruction;Passive range of motion;Patient/family education;Moist Heat    Plan  HEP for LUE    Consulted and Agree with Plan of Care  Patient       Patient will benefit from  skilled therapeutic intervention in order to improve the following deficits and impairments:   Body Structure / Function / Physical Skills: ADL, IADL, Endurance, Body mechanics, Mobility, Strength, Coordination, UE functional use, Pain       Visit Diagnosis: Hemiplegia and hemiparesis following cerebral infarction affecting left non-dominant side (HCC)  Muscle weakness (generalized)  Unsteadiness on feet    Problem List Patient Active Problem List   Diagnosis Date Noted  . 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 Acute blood loss anemia   . Steroid-induced hyperglycemia   . Acute on chronic anemia   . Seizures ([place])   . Spastic hemiparesis ([place])   . 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 Hypertension   . Thrombocytopenia ([place])   . Acute unilateral cerebral infarction in a watershed distribution [facility]) 01/15/2019  . Weakness   . CVA (cerebral vascular accident) ([place]) 01/12/2019  . Malnutrition of moderate degree 07/31/2018  . Elevated serum protein level   . Multiple myeloma ([place])   . Pancytopenia ([place])   . Normocytic normochromic anemia 07/16/2018  . Acute kidney failure ([place]) 07/16/2018  . NICM  (nonischemic cardiomyopathy) ([place]) 11/12/2013  . History of ETOH abuse 11/12/2013  . Chronic combined systolic and diastolic CHF (congestive heart failure) ([place]) 11/12/2013  . Congestive dilated cardiomyopathy ([place]) 11/04/2013  . Malignant hypertension     Carey Bullocks, OTR/L 03/24/2019, 2:54 PM  [facility] [address] [place], [place], [address] Phone: [phone]   Fax:  [phone]  Name: Jeremy Grant MRN: [id number] Date of Birth: Nov 05, 1960

## 2019-03-24 NOTE — Therapy (Signed)
[facility] [address] [place], [place], [address] Phone: [phone]   Fax:  [phone]  Physical Therapy Treatment  Patient Details  Name: Jeremy Grant MRN: [id number] Date of Birth: 04/21/1961 Referring Provider (PT): Dr. Letta Pate   Encounter Date: 03/24/2019  PT End of Session - 03/24/19 1021    Visit Number  11    Number of Visits  16    Date for PT Re-Evaluation  05/14/19    Authorization Type  BCBS    PT Start Time  1018    PT Stop Time  1059    PT Time Calculation (min)  41 min    Equipment Utilized During Treatment  Gait belt   left AFO   Activity Tolerance  Patient tolerated treatment well    Behavior During Therapy  [facility] for tasks assessed/performed       Past Medical History:  Diagnosis Date  . Arthritis   . CHF (congestive heart failure) ([place])   . Combined systolic and diastolic cardiac dysfunction    Echo 11/03/2013 EF 63%, grade 3 diastolic dysfunction  . Hypertension   . NICM (nonischemic cardiomyopathy) ([place])    a. L/RHC (11/05/13): RA: 3, RV 52/5, PA 49/19 (31), PCWP 10, AO 166/93, PA 67%, Fick CO/CI: 5.71/2.97, Lmain: normal, LAD: large, without signficant dz, first diagonal has 20% dz at ostium, LCx: normal, RCA: 30% stenosis at the bifurcation of PDA and PLOM    Past Surgical History:  Procedure Laterality Date  . BIOPSY  01/31/2019   Procedure: BIOPSY;  Surgeon: Otis Brace, MD;  Location: [facility] ENDOSCOPY;  Service: Gastroenterology;;  . ESOPHAGOGASTRODUODENOSCOPY N/A 01/31/2019   Procedure: ESOPHAGOGASTRODUODENOSCOPY (EGD);  Surgeon: Otis Brace, MD;  Location: [facility] ENDOSCOPY;  Service: Gastroenterology;  Laterality: N/A;  . IR FLUORO GUIDE CV LINE RIGHT  07/25/2018  . IR US GUIDE VASC ACCESS RIGHT  07/25/2018  . LEFT AND RIGHT HEART CATHETERIZATION WITH CORONARY ANGIOGRAM N/A 11/05/2013   Procedure: LEFT AND RIGHT HEART CATHETERIZATION WITH CORONARY ANGIOGRAM;  Surgeon: Peter  M Martinique, MD;  Location: [facility] CATH LAB;  Service: Cardiovascular;  Laterality: N/A;  . RIGHT HEART CATHETERIZATION N/A 01/14/2014   Procedure: RIGHT HEART CATH;  Surgeon: Larey Dresser, MD;  Location: [facility] CATH LAB;  Service: Cardiovascular;  Laterality: N/A;    There were no vitals filed for this visit.  Subjective Assessment - 03/24/19 1023    Subjective  Pt reports that he did some walking outside with walker this weekend and did well. He saw Dr. Posey Pronto yesterday and they increased the tizanidine dose to '5mg'$ .    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 8/31-9/3 then inpatient rehab 9/3-9/23.PMH: CHF, HTN, hyperlipidemia, glomerulonephritis, pancytopenia/chronic anemia, seizure left-sided post stroke    Patient Stated Goals  Pt wants to be able to walk again.    Currently in Pain?  No/denies    Pain Onset  1 to 4 weeks ago                       [facility] Adult PT Treatment/Exercise - 03/24/19 1025      Ambulation/Gait   Ambulation/Gait  Yes    Ambulation/Gait Assistance  5: Supervision    Ambulation/Gait Assistance Details  Verbal cues to pick up walker on nonlevel surfaces and increase foot clearance    Ambulation Distance (Feet)  600 Feet    Assistive device  Rolling  walker    Gait Pattern  Step-through pattern;Decreased hip/knee flexion - left    Ambulation Surface  Unlevel;Outdoor;Paved;Grass    Curb  6: Modified independent (Device/increase time)   Pt ambulated down curb with right leg leading down and up.   Gait Comments  BP=180/99 after gait. After sitting 5 min BP=168/88. Pt ambulated 72' with SPC CGA.  Then ambulated another 200' with SPC with headturns left/right x 50' CGA/min assist with head turns as veering to the side some.       Exercises   Exercises  Other Exercises    Other Exercises   PT noted trace contraction left ankle DF. Had patient perform DF x 10 with red  theraband to assist motion.              PT Education - 03/24/19 1606    Education Details  Pt to continue with current HEP with addition of AAROM left DF    Person(s) Educated  Patient;Spouse    Methods  Explanation;Demonstration;Handout    Comprehension  Verbalized understanding;Returned demonstration       PT Short Term Goals - 03/19/19 0938      PT SHORT TERM GOAL #1   Title  Pt will be able to perform initial HEP with assist of wife to continue strength, balance and mobility gains on own.    Baseline  Pt has been working on exercises almost daily unless he has a lot of other activities going on. Wife assisting as needed.    Time  4    Period  Weeks    Status  Achieved    Target Date  03/20/19      PT SHORT TERM GOAL #2   Title  Pt will ambulate 100' with FWW CGA for improved household mobility.    Baseline  Pt ambulated 230' with FWW CGA with sweedish knee cage and left AFO donned.    Time  4    Period  Weeks    Status  Achieved    Target Date  03/20/19      PT SHORT TERM GOAL #3   Title  Pt will be able to maintain standing with minimal UE support x 5 min for improved balance and to assist with ADLs.    Baseline  Pt stood for >5 min without UE support while adding in  balance activities.    Time  4    Period  Weeks    Status  Achieved    Target Date  03/20/19      PT SHORT TERM GOAL #4   Title  Pt will be independent with transfers from varied surfaces for improved mobility at home.    Time  4    Period  Weeks    Status  Achieved    Target Date  03/20/19        PT Long Term Goals - 03/03/19 1306      PT LONG TERM GOAL #1   Title  Pt will be able to ambulate >500' with FWW independently for short community distances.    Time  7    Period  Weeks    Status  New      PT LONG TERM GOAL #2   Title  Pt will decrease TUG from 28 sec to <18 sec for improved balance and decreased fall risk.    Baseline  28 sec on 02/18/19    Time  7    Period  Weeks  Status  New      PT LONG TERM GOAL #3   Title  Pt will increase gait speed from from 0.74ms to >0.8 m/s for improved gait safety.    Baseline  0.85m on 03/03/19    Time  7    Period  Weeks    Status  Achieved      PT LONG TERM GOAL #4   Title  Pt will be independent with progressive HEP for strength, balance and gait to continue gains on own.    Time  7    Period  Weeks    Status  New      PT LONG TERM GOAL #5   Title  Pt will increase gait speed from 0.8931mto >1.32m/11mor improved gait safety.    Baseline  0.3m/79m 10/20 20    Time  6    Period  Weeks    Status  New    Target Date  04/08/19            Plan - 03/24/19 1607    Clinical Impression Statement  Pt progressing well towards goals. Treatment today focused on gait on varied surfaces. Pt showing improved stability with gait even on nonlevel surfaces and good safety awareness. Pt was challenged with gait with SPC when added in head turns.    PT Frequency  Other (comment)    PT Duration  Other (comment)    PT Next Visit Plan  Monitor BP with treatment. Gait with SPC, add in head further balance with head turns.    Consulted and Agree with Plan of Care  Patient       Patient will benefit from skilled therapeutic intervention in order to improve the following deficits and impairments:     Visit Diagnosis: Other abnormalities of gait and mobility  Muscle weakness (generalized)     Problem List Patient Active Problem List   Diagnosis Date Noted  . 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 Acute blood loss anemia   . Steroid-induced hyperglycemia   . Acute on chronic anemia   . Seizures (HCC) [place] Spastic hemiparesis (HCC) [place] ANCA-associated vasculitis (HCC) [place] Hypertension   . Thrombocytopenia (HCC) [place] Acute unilateral cerebral infarction in a watershed distribution (HCC)[facility]03/2020  . Weakness   . CVA (cerebral vascular accident) (HCC) [place]31/2020  . Malnutrition of moderate degree 07/31/2018  . Elevated serum  protein level   . Multiple myeloma (HCC) [place] Pancytopenia (HCC) [place] Normocytic normochromic anemia 07/16/2018  . Acute kidney failure (HCC) [place]04/2020  . NICM (nonischemic cardiomyopathy) (HCC) [place]02/2015  . History of ETOH abuse 11/12/2013  . Chronic combined systolic and diastolic CHF (congestive heart failure) (HCC) [place]02/2015  . Congestive dilated cardiomyopathy (HCC) [place]24/2015  . Malignant hypertension     EmilyElecta Sniff DPT, NCS 03/24/2019, 4:09 PM  [facility] HillT[address]eCape GirardeaunGovernment [place] [id number]: [phone]:  [phone]: DeweyGREGG WINCHELL [phone] of Birth: 05/29/1960/03/13

## 2019-03-26 ENCOUNTER — Other Ambulatory Visit: Payer: Self-pay

## 2019-03-26 ENCOUNTER — Ambulatory Visit: Payer: BC Managed Care – PPO

## 2019-03-26 DIAGNOSIS — R2689 Other abnormalities of gait and mobility: Secondary | ICD-10-CM

## 2019-03-26 DIAGNOSIS — I69354 Hemiplegia and hemiparesis following cerebral infarction affecting left non-dominant side: Secondary | ICD-10-CM

## 2019-03-26 DIAGNOSIS — M6281 Muscle weakness (generalized): Secondary | ICD-10-CM

## 2019-03-26 NOTE — Therapy (Signed)
[facility] [address] [place], [place], [address] Phone: [phone]   Fax:  [phone]  Physical Therapy Treatment  Patient Details  Name: Jeremy Grant MRN: [id number] Date of Birth: 1961/05/02 Referring Provider (PT): Dr. Letta Pate   Encounter Date: 03/26/2019  PT End of Session - 03/26/19 0929    Visit Number  12    Number of Visits  16    Date for PT Re-Evaluation  05/14/19    Authorization Type  BCBS    PT Start Time  0928    PT Stop Time  1014    PT Time Calculation (min)  46 min    Equipment Utilized During Treatment  Gait belt   left AFO   Activity Tolerance  Patient tolerated treatment well    Behavior During Therapy  [facility] for tasks assessed/performed       Past Medical History:  Diagnosis Date  . Arthritis   . CHF (congestive heart failure) ([place])   . Combined systolic and diastolic cardiac dysfunction    Echo 11/03/2013 EF 69%, grade 3 diastolic dysfunction  . Hypertension   . NICM (nonischemic cardiomyopathy) ([place])    a. L/RHC (11/05/13): RA: 3, RV 52/5, PA 49/19 (31), PCWP 10, AO 166/93, PA 67%, Fick CO/CI: 5.71/2.97, Lmain: normal, LAD: large, without signficant dz, first diagonal has 20% dz at ostium, LCx: normal, RCA: 30% stenosis at the bifurcation of PDA and PLOM    Past Surgical History:  Procedure Laterality Date  . BIOPSY  01/31/2019   Procedure: BIOPSY;  Surgeon: Otis Brace, MD;  Location: [facility] ENDOSCOPY;  Service: Gastroenterology;;  . ESOPHAGOGASTRODUODENOSCOPY N/A 01/31/2019   Procedure: ESOPHAGOGASTRODUODENOSCOPY (EGD);  Surgeon: Otis Brace, MD;  Location: [facility] ENDOSCOPY;  Service: Gastroenterology;  Laterality: N/A;  . IR FLUORO GUIDE CV LINE RIGHT  07/25/2018  . IR US GUIDE VASC ACCESS RIGHT  07/25/2018  . LEFT AND RIGHT HEART CATHETERIZATION WITH CORONARY ANGIOGRAM N/A 11/05/2013   Procedure: LEFT AND RIGHT HEART CATHETERIZATION WITH CORONARY ANGIOGRAM;  Surgeon: Peter  M Martinique, MD;  Location: [facility] CATH LAB;  Service: Cardiovascular;  Laterality: N/A;  . RIGHT HEART CATHETERIZATION N/A 01/14/2014   Procedure: RIGHT HEART CATH;  Surgeon: Larey Dresser, MD;  Location: [facility] CATH LAB;  Service: Cardiovascular;  Laterality: N/A;    There were no vitals filed for this visit.  Subjective Assessment - 03/26/19 0929    Subjective  Pt reports he is doing well other than getting wet coming in this morning.    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 8/31-9/3 then inpatient rehab 9/3-9/23.PMH: CHF, HTN, hyperlipidemia, glomerulonephritis, pancytopenia/chronic anemia, seizure left-sided post stroke    Patient Stated Goals  Pt wants to be able to walk again.    Currently in Pain?  No/denies    Pain Onset  1 to 4 weeks ago                       [facility] Adult PT Treatment/Exercise - 03/26/19 0001      Ambulation/Gait   Ambulation/Gait  Yes    Ambulation/Gait Assistance  5: Supervision;4: Min guard    Ambulation Distance (Feet)  345 Feet    Assistive device  Straight cane    Gait Pattern  Step-through pattern    Ambulation Surface  Level;Indoor    Ramp  4: Min assist    Ramp Details (indicate cue type  and reason)  CGA with SPC x 2    Gait Comments  Pt added in head turns left/right 30' x 4 with CGA/min assist due to veering from course at times.  Pt then ambulated another 58' with SPC with head turns up/ down over straight aways 30' x 4 CGA with slowed gait when performing.       Neuro Re-ed    Neuro Re-ed Details   Dynamic gait activities: Reciprocal steps over 2" foam beams and 3 yard sticks with SPC x 6 laps. Pt caught left toes a couple times with clearing beams. Stepping fowards and backwards over 2" beam with left x 6 with SPC then with right x 5 with SPC min assist. Switched to // bar support on right hand with stepping over beam and back with right foot x 10 then  x 6. Pt had one eipsode of left knee buckling with return needing mod assist to regain. Step down off 2" step  and back up in //bar with right UE support and PT providing tactile input at left knee to prevent buckling. Performed x 10. Step-up on 4" step with light right UE support x 10 with tactile cues at knee to tighten quad. Standing on airex feet apart with eyes open x 30 sec then with manual pertubations 30 sec x 2 the eyes closed 30 sec x 2 with increased sway and CGA. Standing on half foam 30 sec x 2 CGA.             PT Education - 03/26/19 1122    Education Details  Pt to continue with current HEP    Person(s) Educated  Patient    Methods  Explanation    Comprehension  Verbalized understanding       PT Short Term Goals - 03/19/19 0938      PT SHORT TERM GOAL #1   Title  Pt will be able to perform initial HEP with assist of wife to continue strength, balance and mobility gains on own.    Baseline  Pt has been working on exercises almost daily unless he has a lot of other activities going on. Wife assisting as needed.    Time  4    Period  Weeks    Status  Achieved    Target Date  03/20/19      PT SHORT TERM GOAL #2   Title  Pt will ambulate 100' with FWW CGA for improved household mobility.    Baseline  Pt ambulated 230' with FWW CGA with sweedish knee cage and left AFO donned.    Time  4    Period  Weeks    Status  Achieved    Target Date  03/20/19      PT SHORT TERM GOAL #3   Title  Pt will be able to maintain standing with minimal UE support x 5 min for improved balance and to assist with ADLs.    Baseline  Pt stood for >5 min without UE support while adding in  balance activities.    Time  4    Period  Weeks    Status  Achieved    Target Date  03/20/19      PT SHORT TERM GOAL #4   Title  Pt will be independent with transfers from varied surfaces for improved mobility at home.    Time  4    Period  Weeks    Status  Achieved    Target Date  03/20/19         PT Long Term Goals - 03/03/19 1306      PT LONG TERM GOAL #1   Title  Pt will be able to ambulate >500' with FWW independently for short community distances.    Time  7    Period  Weeks    Status  New      PT LONG TERM GOAL #2   Title  Pt will decrease TUG from 28 sec to <18 sec for improved balance and decreased fall risk.    Baseline  28 sec on 02/18/19    Time  7    Period  Weeks    Status  New      PT LONG TERM GOAL #3   Title  Pt will increase gait speed from from 0.8ms to >0.8 m/s for improved gait safety.    Baseline  0.862m on 03/03/19    Time  7    Period  Weeks    Status  Achieved      PT LONG TERM GOAL #4   Title  Pt will be independent with progressive HEP for strength, balance and gait to continue gains on own.    Time  7    Period  Weeks    Status  New      PT LONG TERM GOAL #5   Title  Pt will increase gait speed from 0.8922mto >1.76m/36mor improved gait safety.    Baseline  0.27m/61m 10/20 20    Time  6    Period  Weeks    Status  New    Target Date  04/08/19            Plan - 03/26/19 1122    Clinical Impression Statement  Pt was challenged with eccentric control on left quad with activities today requiring a little more UE support and assistance at times. Pt again progressed gait with SPC. Some instability with head turns and noted difficulty with activities relying more on vestibular system for balance.    PT Frequency  Other (comment)    PT Duration  Other (comment)    PT Next Visit Plan  Monitor BP with treatment. Gait with SPC, add in head further balance with head turns. Continue left LE strengthening.    Consulted and Agree with Plan of Care  Patient       Patient will benefit from skilled therapeutic intervention in order to improve the following deficits and impairments:     Visit Diagnosis: Hemiplegia and hemiparesis following cerebral infarction affecting left non-dominant side (HCC)  Muscle weakness  (generalized)  Other abnormalities of gait and mobility     Problem List Patient Active Problem List   Diagnosis Date Noted  . 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 Acute blood loss anemia   . Steroid-induced hyperglycemia   . Acute on chronic anemia   . Seizures (HCC) [place] Spastic hemiparesis (HCC) [place] ANCA-associated vasculitis (HCC) [place] Hypertension   . Thrombocytopenia (HCC) [place] Acute unilateral cerebral infarction in a watershed distribution (HCC)[facility]03/2020  . Weakness   . CVA (cerebral vascular accident) (HCC) [place]31/2020  . Malnutrition of moderate degree 07/31/2018  . Elevated serum protein level   . Multiple myeloma (HCC) [place] Pancytopenia (HCC) [place] Normocytic normochromic anemia 07/16/2018  . Acute kidney failure (HCC) [place]04/2020  . NICM (nonischemic cardiomyopathy) (HCC) [place]02/2015  . History of ETOH abuse 11/12/2013  .  Chronic combined systolic and diastolic CHF (congestive heart failure) ([place]) 11/12/2013  . Congestive dilated cardiomyopathy ([place]) 11/04/2013  . Malignant hypertension     Electa Sniff, PT, DPT, NCS 03/26/2019, 11:26 AM  [facility] [address] [place], [place], [address] Phone: [phone]   Fax:  [phone]  Name: Jeremy Grant MRN: [id number] Date of Birth: 1960-09-12

## 2019-03-30 ENCOUNTER — Other Ambulatory Visit: Payer: Self-pay

## 2019-03-30 ENCOUNTER — Ambulatory Visit (HOSPITAL_COMMUNITY)
Admission: RE | Admit: 2019-03-30 | Discharge: 2019-03-30 | Disposition: A | Discharge status: Home / Self Care | Payer: BC Managed Care – PPO | Source: Ambulatory Visit | Attending: Nephrology | Admitting: Nephrology

## 2019-03-30 VITALS — BP 177/92 | HR 70 | Temp 97.3°F | Resp 18

## 2019-03-30 DIAGNOSIS — N179 Acute kidney failure, unspecified: Secondary | ICD-10-CM | POA: Insufficient documentation

## 2019-03-30 LAB — POCT HEMOGLOBIN-HEMACUE: Hemoglobin: 11.2 g/dL — ABNORMAL LOW (ref 13.0–17.0)

## 2019-03-30 MED ORDER — EPOETIN ALFA-EPBX 10000 UNIT/ML IJ SOLN
20000.0000 [IU] | INTRAMUSCULAR | Status: DC
  Administered 2019-03-30: 11:00:00 20000 [IU] via SUBCUTANEOUS
  Filled 2019-03-30: qty 2

## 2019-03-31 ENCOUNTER — Ambulatory Visit: Payer: BC Managed Care – PPO

## 2019-03-31 ENCOUNTER — Other Ambulatory Visit: Payer: Self-pay | Admitting: Registered Nurse

## 2019-03-31 ENCOUNTER — Other Ambulatory Visit: Payer: Self-pay

## 2019-03-31 ENCOUNTER — Other Ambulatory Visit: Payer: Self-pay | Admitting: Physical Medicine & Rehabilitation

## 2019-03-31 DIAGNOSIS — M6281 Muscle weakness (generalized): Secondary | ICD-10-CM

## 2019-03-31 DIAGNOSIS — R2689 Other abnormalities of gait and mobility: Secondary | ICD-10-CM

## 2019-03-31 NOTE — Patient Instructions (Signed)
Access Code: [email or web address]  URL: [email or web address]  Date: 03/31/2019  Prepared by: Cherly Anderson   Exercises Seated Hamstring Stretch with Strap - 4 reps - 1 sets - 1 min hold - 3x daily - 7x weekly Supine Short Arc Quad - 10 reps - 1 sets - 2x daily - 7x weekly Supine Bridge with Pelvic Floor Contraction on Swiss Ball - 10 reps - 1 sets - 2x daily - 7x weekly Supine Ankle Dorsiflexion Stretch with Caregiver - 4 reps - 1 sets - 1 min hold - 3x daily - 7x weekly Clamshell - 5 reps - 2 sets - 1x daily - 7x weekly Seated Knee Flexion - 5 reps - 3 sets - 1x daily - 7x weekly Prone Knee Flexion - 10 reps - 2 sets - 1x daily - 5x weekly Standing with Head Rotation - 3 reps - 1 sets - 10 turns hold - 2x daily - 7x weekly Standing Balance with Eyes Closed - 2 reps - 1 sets - 30 sec hold - 2x daily - 7x weekly

## 2019-03-31 NOTE — Therapy (Signed)
[facility] [address] [place], [place], [address] Phone: [phone]   Fax:  [phone]  Physical Therapy Treatment  Patient Details  Name: Jeremy Grant MRN: [id number] Date of Birth: [birth year] Referring Provider (PT): Dr. Letta Pate   Encounter Date: 03/31/2019  PT End of Session - 03/31/19 0939    Visit Number  13    Number of Visits  16    Date for PT Re-Evaluation  05/14/19    Authorization Type  BCBS    PT Start Time  [phone]    PT Stop Time  1017    PT Time Calculation (min)  46 min    Equipment Utilized During Treatment  Gait belt   left AFO   Activity Tolerance  Patient tolerated treatment well    Behavior During Therapy  [facility] for tasks assessed/performed       Past Medical History:  Diagnosis Date  . Arthritis   . CHF (congestive heart failure) ([place])   . Combined systolic and diastolic cardiac dysfunction    Echo 11/03/2013 EF 35%, grade 3 diastolic dysfunction  . Hypertension   . NICM (nonischemic cardiomyopathy) ([place])    a. L/RHC (11/05/13): RA: 3, RV 52/5, PA 49/19 (31), PCWP 10, AO 166/93, PA 67%, Fick CO/CI: 5.71/2.97, Lmain: normal, LAD: large, without signficant dz, first diagonal has 20% dz at ostium, LCx: normal, RCA: 30% stenosis at the bifurcation of PDA and PLOM    Past Surgical History:  Procedure Laterality Date  . BIOPSY  01/31/2019   Procedure: BIOPSY;  Surgeon: Otis Brace, MD;  Location: [facility] ENDOSCOPY;  Service: Gastroenterology;;  . ESOPHAGOGASTRODUODENOSCOPY N/A 01/31/2019   Procedure: ESOPHAGOGASTRODUODENOSCOPY (EGD);  Surgeon: Otis Brace, MD;  Location: [facility] ENDOSCOPY;  Service: Gastroenterology;  Laterality: N/A;  . IR FLUORO GUIDE CV LINE RIGHT  07/25/2018  . IR US GUIDE VASC ACCESS RIGHT  07/25/2018  . LEFT AND RIGHT HEART CATHETERIZATION WITH CORONARY ANGIOGRAM N/A 11/05/2013   Procedure: LEFT AND RIGHT HEART CATHETERIZATION WITH CORONARY ANGIOGRAM;  Surgeon: Peter  M Martinique, MD;  Location: [facility] CATH LAB;  Service: Cardiovascular;  Laterality: N/A;  . RIGHT HEART CATHETERIZATION N/A 01/14/2014   Procedure: RIGHT HEART CATH;  Surgeon: Larey Dresser, MD;  Location: [facility] CATH LAB;  Service: Cardiovascular;  Laterality: N/A;    There were no vitals filed for this visit.  Subjective Assessment - 03/31/19 0940    Subjective  Pt reports that left foot and ankle were a little more swollen after going to hospital for his injection yesterday. Hemoglobin was 11.2.    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 8/31-9/3 then inpatient rehab 9/3-9/23.PMH: CHF, HTN, hyperlipidemia, glomerulonephritis, pancytopenia/chronic anemia, seizure left-sided post stroke    Patient Stated Goals  Pt wants to be able to walk again.    Currently in Pain?  No/denies    Pain Onset  1 to 4 weeks ago                       [facility] Adult PT Treatment/Exercise - 03/31/19 0941      Ambulation/Gait   Ambulation/Gait  Yes    Ambulation/Gait Assistance  4: Min guard    Ambulation Distance (Feet)  600 Feet    Assistive device  Straight cane    Gait Pattern  Step-through pattern;Decreased step length - right;Decreased step length - left;Decreased hip/knee flexion - left  Ambulation Surface  Level;Indoor    Gait velocity  0.29ms    Gait Comments  Dynamic gait activities in hall 30' x 2 with head turns left/right and up/down CGA. Increased sway with left/right head turns. BP=152/88 after. After seated rest break pt performed more dynamic gait activities in hallway with SPC: marching gait with cues to control each step 30' x 2, large steps 30' x 2, backwards gait 30' x 2 with forwards in between. CGA for all for safety.       Neuro Re-ed    Neuro Re-ed Details   Pt performed standing on pllow: feet apart eyes open x 30 sec, eyes closed x 30 sec, head turms left/right x 10 then with eyes closed  CGA. Standing on pillow with feet together eyes open x 30 sec then eyes closed x 30 sec. Marching on pillow with SPC x 10 CGA/min assist.      Exercises   Exercises  Other Exercises    Other Exercises   Left ankle DF/FP x 10 AAROM with patient able to initiate 2-/5 today.              PT Education - 03/31/19 1334    Education Details  Added balance exercises to HEP. Instructed to perform in corner for safety with wife supervising.    Person(s) Educated  Patient;Spouse    Methods  Explanation;Demonstration    Comprehension  Verbalized understanding       PT Short Term Goals - 03/19/19 [phone]     PT SHORT TERM GOAL #1   Title  Pt will be able to perform initial HEP with assist of wife to continue strength, balance and mobility gains on own.    Baseline  Pt has been working on exercises almost daily unless he has a lot of other activities going on. Wife assisting as needed.    Time  4    Period  Weeks    Status  Achieved    Target Date  03/20/19      PT SHORT TERM GOAL #2   Title  Pt will ambulate 100' with FWW CGA for improved household mobility.    Baseline  Pt ambulated 230' with FWW CGA with sweedish knee cage and left AFO donned.    Time  4    Period  Weeks    Status  Achieved    Target Date  03/20/19      PT SHORT TERM GOAL #3   Title  Pt will be able to maintain standing with minimal UE support x 5 min for improved balance and to assist with ADLs.    Baseline  Pt stood for >5 min without UE support while adding in  balance activities.    Time  4    Period  Weeks    Status  Achieved    Target Date  03/20/19      PT SHORT TERM GOAL #4   Title  Pt will be independent with transfers from varied surfaces for improved mobility at home.    Time  4    Period  Weeks    Status  Achieved    Target Date  03/20/19        PT Long Term Goals - 03/03/19 1306      PT LONG TERM GOAL #1   Title  Pt will be able to ambulate >500' with FWW independently for short community  distances.    Time  7    Period  Weeks    Status  New      PT LONG TERM GOAL #2   Title  Pt will decrease TUG from 28 sec to <18 sec for improved balance and decreased fall risk.    Baseline  28 sec on 02/18/19    Time  7    Period  Weeks    Status  New      PT LONG TERM GOAL #3   Title  Pt will increase gait speed from from 0.46ms to >0.8 m/s for improved gait safety.    Baseline  0.871m on 03/03/19    Time  7    Period  Weeks    Status  Achieved      PT LONG TERM GOAL #4   Title  Pt will be independent with progressive HEP for strength, balance and gait to continue gains on own.    Time  7    Period  Weeks    Status  New      PT LONG TERM GOAL #5   Title  Pt will increase gait speed from 0.8977mto >1.2m/15mor improved gait safety.    Baseline  0.63m/53m 10/20 20    Time  6    Period  Weeks    Status  New    Target Date  04/08/19            Plan - 03/31/19 1335    Clinical Impression Statement  Pt is starting to show some muscle activation in left ankle DF and PF. Pt continues to progress gait with SPC. He is challenged with balance tasks involving head turns and on noncompliant surfaces.    PT Frequency  Other (comment)    PT Duration  Other (comment)    PT Next Visit Plan  Monitor BP with treatment. Assess goals for recert next visit.    Consulted and Agree with Plan of Care  Patient       Patient will benefit from skilled therapeutic intervention in order to improve the following deficits and impairments:     Visit Diagnosis: Muscle weakness (generalized)  Other abnormalities of gait and mobility     Problem List Patient Active Problem List   Diagnosis Date Noted  . 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 Acute blood loss anemia   . Steroid-induced hyperglycemia   . Acute on chronic anemia   . Seizures (HCC) [place] Spastic hemiparesis (HCC) [place] ANCA-associated vasculitis (HCC) [place] Hypertension   . Thrombocytopenia (HCC) [place] Acute unilateral cerebral infarction  in a watershed distribution (HCC)[facility]03/2020  . Weakness   . CVA (cerebral vascular accident) (HCC) [place]31/2020  . Malnutrition of moderate degree 07/31/2018  . Elevated serum protein level   . Multiple myeloma (HCC) [place] Pancytopenia (HCC) [place] Normocytic normochromic anemia 07/16/2018  . Acute kidney failure (HCC) [place]04/2020  . NICM (nonischemic cardiomyopathy) (HCC) [place]02/2015  . History of ETOH abuse 11/12/2013  . Chronic combined systolic and diastolic CHF (congestive heart failure) (HCC) [place]02/2015  . Congestive dilated cardiomyopathy (HCC) [place]24/2015  . Malignant hypertension     EmilyElecta Sniff DPT, NCS 03/31/2019, 1:41 PM  [id number]: [phone]:  [phone]: DeweyKEROLOS NEHME [phone] of Birth: 12/11/09-08-1960

## 2019-04-01 NOTE — Telephone Encounter (Signed)
Refill changed to 4mg  q8 PRN

## 2019-04-02 ENCOUNTER — Other Ambulatory Visit: Payer: Self-pay

## 2019-04-02 ENCOUNTER — Ambulatory Visit: Payer: BC Managed Care – PPO

## 2019-04-02 DIAGNOSIS — M6281 Muscle weakness (generalized): Secondary | ICD-10-CM

## 2019-04-02 DIAGNOSIS — R2689 Other abnormalities of gait and mobility: Secondary | ICD-10-CM

## 2019-04-02 DIAGNOSIS — I69354 Hemiplegia and hemiparesis following cerebral infarction affecting left non-dominant side: Secondary | ICD-10-CM

## 2019-04-02 NOTE — Therapy (Signed)
[place] [address] [place], [place], [address] Phone: [phone]   Fax:  [phone]  Physical Therapy Treatment  Patient Details  Name: Jeremy Grant MRN: [id number] Date of Birth: 09/03/1960 Referring Provider (PT): Dr. Letta Pate   Encounter Date: 04/02/2019  PT End of Session - 04/02/19 0932    Visit Number  14    Number of Visits  32    Date for PT Re-Evaluation  06/11/19    Authorization Type  BCBS    PT Start Time  0930    PT Stop Time  1010    PT Time Calculation (min)  40 min    Equipment Utilized During Treatment  Gait belt   left AFO   Activity Tolerance  Patient tolerated treatment well    Behavior During Therapy  [facility] for tasks assessed/performed       Past Medical History:  Diagnosis Date  . Arthritis   . CHF (congestive heart failure) ([place])   . Combined systolic and diastolic cardiac dysfunction    Echo 11/03/2013 EF 88%, grade 3 diastolic dysfunction  . Hypertension   . NICM (nonischemic cardiomyopathy) ([place])    a. L/RHC (11/05/13): RA: 3, RV 52/5, PA 49/19 (31), PCWP 10, AO 166/93, PA 67%, Fick CO/CI: 5.71/2.97, Lmain: normal, LAD: large, without signficant dz, first diagonal has 20% dz at ostium, LCx: normal, RCA: 30% stenosis at the bifurcation of PDA and PLOM    Past Surgical History:  Procedure Laterality Date  . BIOPSY  01/31/2019   Procedure: BIOPSY;  Surgeon: Otis Brace, MD;  Location: [facility];  Service: Gastroenterology;;  . ESOPHAGOGASTRODUODENOSCOPY N/A 01/31/2019   Procedure: ESOPHAGOGASTRODUODENOSCOPY (EGD);  Surgeon: Otis Brace, MD;  Location: [facility] ENDOSCOPY;  Service: Gastroenterology;  Laterality: N/A;  . IR FLUORO GUIDE CV LINE RIGHT  07/25/2018  . IR US GUIDE VASC ACCESS RIGHT  07/25/2018  . LEFT AND RIGHT HEART CATHETERIZATION WITH CORONARY ANGIOGRAM N/A 11/05/2013   Procedure: LEFT AND RIGHT HEART CATHETERIZATION WITH CORONARY ANGIOGRAM;  Surgeon: Peter  M Martinique, MD;  Location: [facility] CATH LAB;  Service: Cardiovascular;  Laterality: N/A;  . RIGHT HEART CATHETERIZATION N/A 01/14/2014   Procedure: RIGHT HEART CATH;  Surgeon: Larey Dresser, MD;  Location: [facility] CATH LAB;  Service: Cardiovascular;  Laterality: N/A;    There were no vitals filed for this visit.  Subjective Assessment - 04/02/19 0932    Subjective  Pt reports doing well. No new issues.    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 8/31-9/3 then inpatient rehab 9/3-9/23.PMH: CHF, HTN, hyperlipidemia, glomerulonephritis, pancytopenia/chronic anemia, seizure left-sided post stroke    Patient Stated Goals  Pt wants to be able to walk again.    Currently in Pain?  No/denies    Pain Onset  1 to 4 weeks ago         [facility] PT Assessment - 04/02/19 0945      Assessment   Medical Diagnosis  CVA with seizures    Referring Provider (PT)  Dr. Letta Pate    Onset Date/Surgical Date  01/12/19                   [facility] Adult PT Treatment/Exercise - 04/02/19 0945      Transfers   Transfers  Sit to Stand;Stand to Sit    Comments  30 sec sit to stand=7 with arms      Ambulation/Gait  Ambulation/Gait  Yes    Ambulation/Gait Assistance  6: Modified independent (Device/Increase time);4: Min guard    Ambulation Distance (Feet)  575 Feet   230' with SPC CGA   Assistive device  Rolling walker;Straight cane    Gait Pattern  Step-through pattern;Decreased hip/knee flexion - left    Ambulation Surface  Level;Indoor    Gait Comments  BP=172/88 after gait and balance activities      Standardized Balance Assessment   Standardized Balance Assessment  Berg Balance Test;Timed Up and Go Test      Berg Balance Test   Sit to Stand  Able to stand  independently using hands    Standing Unsupported  Able to stand safely 2 minutes    Sitting with Back Unsupported but Feet Supported on Floor or Stool  Able to  sit safely and securely 2 minutes    Stand to Sit  Sits safely with minimal use of hands    Transfers  Able to transfer safely, minor use of hands    Standing Unsupported with Eyes Closed  Able to stand 10 seconds safely    Standing Ubsupported with Feet Together  Able to place feet together independently and stand 1 minute safely    From Standing, Reach Forward with Outstretched Arm  Can reach forward >12 cm safely (5")    From Standing Position, Pick up Object from Floor  Able to pick up shoe, needs supervision    From Standing Position, Turn to Look Behind Over each Shoulder  Turn sideways only but maintains balance    Turn 360 Degrees  Able to turn 360 degrees safely but slowly    Standing Unsupported, Alternately Place Feet on Step/Stool  Able to complete >2 steps/needs minimal assist    Standing Unsupported, One Foot in Front  Able to take small step independently and hold 30 seconds    Standing on One Leg  Tries to lift leg/unable to hold 3 seconds but remains standing independently    Total Score  41      Timed Up and Go Test   TUG  Normal TUG   performed with SPC. With walker=15 sec   Normal TUG (seconds)  22   22 sec from chair without arms with arm, 16 sec with armrest            PT Education - 04/02/19 1134    Education Details  Pt to continue with current HEP    Person(s) Educated  Patient    Methods  Explanation    Comprehension  Verbalized understanding       PT Short Term Goals - 04/02/19 1213      PT SHORT TERM GOAL #1   Title  Pt will be able to perform HEP for strength and balance with wife assist.    Baseline  Pt has been working on exercises almost daily unless he has a lot of other activities going on. Wife assisting as needed.    Time  4    Period  Weeks    Status  Achieved    Target Date  03/20/19      PT SHORT TERM GOAL #2   Title  Pt will ambulate 100' with FWW CGA for improved household mobility.    Baseline  Pt ambulated 230' with FWW CGA  with sweedish knee cage and left AFO donned.    Time  4    Period  Weeks    Status  Achieved    Target  Date  03/20/19      PT SHORT TERM GOAL #3   Title  Pt will be able to maintain standing with minimal UE support x 5 min for improved balance and to assist with ADLs.    Baseline  Pt stood for >5 min without UE support while adding in  balance activities.    Time  4    Period  Weeks    Status  Achieved    Target Date  03/20/19      PT SHORT TERM GOAL #4   Title  Pt will be independent with transfers from varied surfaces for improved mobility at home.    Time  4    Period  Weeks    Status  Achieved    Target Date  03/20/19        PT Long Term Goals - 04/02/19 0933      PT LONG TERM GOAL #1   Title  Pt will be able to ambulate >500' with FWW independently for short community distances.    Baseline  Pt ambulated with FWW 575' independently meeting goal.    Time  7    Period  Weeks    Status  Achieved      PT LONG TERM GOAL #2   Title  Pt will decrease TUG from 28 sec to <18 sec for improved balance and decreased fall risk.    Baseline  28 sec on 02/18/19    Time  7    Period  Weeks    Status  New      PT LONG TERM GOAL #3   Title  Pt will increase gait speed from from 0.15ms to >0.8 m/s for improved gait safety.    Baseline  0.8562m on 03/03/19    Time  7    Period  Weeks    Status  Achieved      PT LONG TERM GOAL #4   Title  Pt will be independent with progressive HEP for strength, balance and gait to continue gains on own.    Time  7    Period  Weeks    Status  New      PT LONG TERM GOAL #5   Title  Pt will increase gait speed from 0.8919mto >1.33m/72mor improved gait safety.    Baseline  with FWW comfortable=0.3m/72md fast=0.22m/ 54m with SPC comfortable=0.633m/s 72mfast=.101m/s  25mme  6    Period  Weeks    Status  On-going      Updated goals:  PT Short Term Goals - 04/02/19 1218      PT SHORT TERM GOAL #1   Title  Pt will be independent with  progressive HEP for strength and balance to continue gains on own.    Time  4    Period  Weeks    Status  New    Target Date  05/02/19      PT SHORT TERM GOAL #2   Title  Pt will increase gait speed from 0.89m/s to 61m62m/s with34mC for improved gait safety.    Time  4    Period  Weeks    Status  New    Target Date  05/02/19      PT SHORT TERM GOAL #3   Title  Pt will increase Berg Balance from 41/56 to >45/56 for improved balance and decreased fall risk.    Time  4    Period  Weeks  Status  New    Target Date  05/02/19      PT SHORT TERM GOAL #4   Title  Pt will ambulate >400' with SPC on level surfaces supervision for improved mobility.    Time  4    Period  Weeks    Status  New    Target Date  05/02/19      PT SHORT TERM GOAL #5   Title  Pt will be able to perform sit to stand without UE support from standard chair consistently for improved functional strength.    Time  4    Period  Weeks    Status  New    Target Date  05/02/19      PT Long Term Goals - 04/02/19 1221      PT LONG TERM GOAL #1   Title  Pt will ambulate > 500' on varied surfaces with SPC mod I for improved community mobility.    Time  8    Period  Weeks    Status  New    Target Date  06/01/19      PT LONG TERM GOAL #2   Title  Pt will increase Berg from 41/56 to > 48/56 for improved balance and decreased fall risk.    Time  8    Period  Weeks    Status  New    Target Date  06/01/19      PT LONG TERM GOAL #3   Title  Pt will increase 30 sec sit to stand from 7 reps from standard chair with arms to 10 or more for improved functional strength.    Time  8    Period  Weeks    Status  New    Target Date  06/01/19      PT LONG TERM GOAL #4   Title  Pt will decrease TUG from 16 sec with SPC to <14 sec for improved balance and functional mobility.    Time  8    Period  Weeks    Status  New    Target Date  06/01/19            Plan - 04/02/19 1140    Clinical Impression Statement  Pt  progressing well towards all goals. Met gait with FWW goal and has been progressing with gait with SPC. Pt still at [facility] level with cane. Pt was just short of gait speed goal with walker and PT also retested with cane as plan to progress futher on cane with ongoing sessions. Gait speed is just short of safe community ambulator speed. Performed Berg Balance today and patient fall risk based on score of 41/56. Pt continues to benefit from skilled PT to address strength, balance and gait deficits.    Personal Factors and Comorbidities  Comorbidity 3+    Comorbidities  CHF, HTN, hyperlipidemia, glomerulonephritis, pancytopenia/chronic anemia, seizure    Examination-Activity Limitations  Stairs;Stand;Squat;Locomotion Level;Transfers;Bed Mobility    Examination-Participation Restrictions  Community Activity;Shop;Driving;Cleaning;Yard Work    [employer] officer  Evolving/Moderate complexity    Clinical Decision Making  Moderate    Rehab Potential  Good    PT Frequency  2x / week    PT Duration  8 weeks    PT Treatment/Interventions  DME Instruction;ADLs/Self Care Home Management;Gait training;Stair training;Functional mobility training;Therapeutic activities;Therapeutic exercise;Balance training;Orthotic Fit/Training;Patient/family education;Neuromuscular re-education;Wheelchair mobility training;Passive range of motion;Manual techniques    PT Next Visit Plan  Monitor BP with treatment. Continue gait with SPC adding  in dynamic gait activities (head turns, obstacles). Standing balance and strengthening to left LE. Starting to get some left DF activation.    Consulted and Agree with Plan of Care  Patient       Patient will benefit from skilled therapeutic intervention in order to improve the following deficits and impairments:  Abnormal gait, Difficulty walking, Decreased balance, Decreased mobility, Decreased strength, Decreased knowledge of use of DME, Decreased endurance, Decreased activity  tolerance, Decreased range of motion, Pain, Increased muscle spasms  Visit Diagnosis: Muscle weakness (generalized)  Other abnormalities of gait and mobility  Hemiplegia and hemiparesis following cerebral infarction affecting left non-dominant side [facility])     Problem List Patient Active Problem List   Diagnosis Date Noted  . 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 Acute blood loss anemia   . Steroid-induced hyperglycemia   . Acute on chronic anemia   . Seizures ([place])   . Spastic hemiparesis ([place])   . 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 Hypertension   . Thrombocytopenia ([place])   . Acute unilateral cerebral infarction in a watershed distribution [facility]) 01/15/2019  . Weakness   . CVA (cerebral vascular accident) ([place]) 01/12/2019  . Malnutrition of moderate degree 07/31/2018  . Elevated serum protein level   . Multiple myeloma ([place])   . Pancytopenia ([place])   . Normocytic normochromic anemia 07/16/2018  . Acute kidney failure ([place]) 07/16/2018  . NICM (nonischemic cardiomyopathy) ([place]) 11/12/2013  . History of ETOH abuse 11/12/2013  . Chronic combined systolic and diastolic CHF (congestive heart failure) ([place]) 11/12/2013  . Congestive dilated cardiomyopathy ([place]) 11/04/2013  . Malignant hypertension     Electa Sniff, PT, DPT, NCS 04/02/2019, 12:17 PM  [place] [address] [place], [place], [address] Phone: [phone]   Fax:  [phone]  Name: NYZAIAH KAI MRN: [id number] Date of Birth: 1961/01/24

## 2019-04-07 ENCOUNTER — Encounter (HOSPITAL_COMMUNITY): Payer: BC Managed Care – PPO

## 2019-04-07 ENCOUNTER — Ambulatory Visit: Payer: BC Managed Care – PPO | Admitting: Physical Therapy

## 2019-04-13 ENCOUNTER — Other Ambulatory Visit: Payer: Self-pay

## 2019-04-13 ENCOUNTER — Ambulatory Visit (HOSPITAL_COMMUNITY)
Admission: RE | Admit: 2019-04-13 | Discharge: 2019-04-13 | Disposition: A | Discharge status: Home / Self Care | Payer: BC Managed Care – PPO | Source: Ambulatory Visit | Attending: Nephrology | Admitting: Nephrology

## 2019-04-13 VITALS — BP 154/78 | HR 61 | Temp 97.3°F | Resp 16 | Ht 73.0 in | Wt 149.0 lb

## 2019-04-13 DIAGNOSIS — N179 Acute kidney failure, unspecified: Secondary | ICD-10-CM | POA: Insufficient documentation

## 2019-04-13 LAB — IRON AND TIBC
Iron: 71 ug/dL (ref 45–182)
Saturation Ratios: 25 % (ref 17.9–39.5)
TIBC: 280 ug/dL (ref 250–450)
UIBC: 209 ug/dL

## 2019-04-13 LAB — POCT HEMOGLOBIN-HEMACUE: Hemoglobin: 12 g/dL — ABNORMAL LOW (ref 13.0–17.0)

## 2019-04-13 LAB — FERRITIN: Ferritin: 1321 ng/mL — ABNORMAL HIGH (ref 24–336)

## 2019-04-13 MED ORDER — EPOETIN ALFA-EPBX 10000 UNIT/ML IJ SOLN
20000.0000 [IU] | INTRAMUSCULAR | Status: DC
  Filled 2019-04-13: qty 2

## 2019-04-15 ENCOUNTER — Ambulatory Visit: Payer: BC Managed Care – PPO | Admitting: Occupational Therapy

## 2019-04-15 ENCOUNTER — Encounter: Payer: Self-pay | Admitting: Physical Therapy

## 2019-04-15 ENCOUNTER — Ambulatory Visit: Payer: BC Managed Care – PPO | Attending: Physical Medicine & Rehabilitation | Admitting: Physical Therapy

## 2019-04-15 ENCOUNTER — Other Ambulatory Visit: Payer: Self-pay

## 2019-04-15 VITALS — BP 170/90

## 2019-04-15 DIAGNOSIS — I6389 Other cerebral infarction: Secondary | ICD-10-CM | POA: Diagnosis present

## 2019-04-15 DIAGNOSIS — R2681 Unsteadiness on feet: Secondary | ICD-10-CM | POA: Diagnosis present

## 2019-04-15 DIAGNOSIS — M6281 Muscle weakness (generalized): Secondary | ICD-10-CM | POA: Diagnosis present

## 2019-04-15 DIAGNOSIS — R2689 Other abnormalities of gait and mobility: Secondary | ICD-10-CM | POA: Insufficient documentation

## 2019-04-15 DIAGNOSIS — I69354 Hemiplegia and hemiparesis following cerebral infarction affecting left non-dominant side: Secondary | ICD-10-CM | POA: Diagnosis present

## 2019-04-15 NOTE — Therapy (Signed)
[place] [address] [place], [place], [address] Phone: [phone]   Fax:  [phone]  Physical Therapy Treatment  Patient Details  Name: Jeremy Grant MRN: [id number] Date of Birth: 02-05-1961 Referring Provider (PT): Dr. Letta Pate   Encounter Date: 04/15/2019  PT End of Session - 04/15/19 0808    Visit Number  15    Number of Visits  32    Date for PT Re-Evaluation  06/11/19    Authorization Type  BCBS    PT Start Time  0802    PT Stop Time  0844    PT Time Calculation (min)  42 min    Equipment Utilized During Treatment  Gait belt   left AFO   Activity Tolerance  Patient tolerated treatment well    Behavior During Therapy  [facility] for tasks assessed/performed       Past Medical History:  Diagnosis Date  . Arthritis   . CHF (congestive heart failure) ([place])   . Combined systolic and diastolic cardiac dysfunction    Echo 11/03/2013 EF 74%, grade 3 diastolic dysfunction  . Hypertension   . NICM (nonischemic cardiomyopathy) ([place])    a. L/RHC (11/05/13): RA: 3, RV 52/5, PA 49/19 (31), PCWP 10, AO 166/93, PA 67%, Fick CO/CI: 5.71/2.97, Lmain: normal, LAD: large, without signficant dz, first diagonal has 20% dz at ostium, LCx: normal, RCA: 30% stenosis at the bifurcation of PDA and PLOM    Past Surgical History:  Procedure Laterality Date  . BIOPSY  01/31/2019   Procedure: BIOPSY;  Surgeon: Otis Brace, MD;  Location: [facility];  Service: Gastroenterology;;  . ESOPHAGOGASTRODUODENOSCOPY N/A 01/31/2019   Procedure: ESOPHAGOGASTRODUODENOSCOPY (EGD);  Surgeon: Otis Brace, MD;  Location: [facility] ENDOSCOPY;  Service: Gastroenterology;  Laterality: N/A;  . IR FLUORO GUIDE CV LINE RIGHT  07/25/2018  . IR US GUIDE VASC ACCESS RIGHT  07/25/2018  . LEFT AND RIGHT HEART CATHETERIZATION WITH CORONARY ANGIOGRAM N/A 11/05/2013   Procedure: LEFT AND RIGHT HEART CATHETERIZATION WITH CORONARY ANGIOGRAM;  Surgeon: Peter M  Martinique, MD;  Location: [facility] CATH LAB;  Service: Cardiovascular;  Laterality: N/A;  . RIGHT HEART CATHETERIZATION N/A 01/14/2014   Procedure: RIGHT HEART CATH;  Surgeon: Larey Dresser, MD;  Location: [facility] CATH LAB;  Service: Cardiovascular;  Laterality: N/A;    Vitals:   04/15/19 0805 04/15/19 0836 04/15/19 0843  BP: (!) 168/93 (!) 181/93 (!) 170/90    Subjective Assessment - 04/15/19 0805    Subjective  No new complaints. No falls. Having some left hip pain that started when he did the 5 time sit to stand.    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 8/31-9/3 then inpatient rehab 9/3-9/23.PMH: CHF, HTN, hyperlipidemia, glomerulonephritis, pancytopenia/chronic anemia, seizure left-sided post stroke    Patient Stated Goals  Pt wants to be able to walk again.    Currently in Pain?  Yes    Pain Score  3     Pain Location  Hip    Pain Orientation  Left    Pain Descriptors / Indicators  Aching;Sore    Pain Type  Acute pain    Pain Onset  1 to 4 weeks ago    Pain Frequency  Intermittent    Aggravating Factors   lying on it at night    Pain Relieving Factors  nothing so far           [facility] Adult  PT Treatment/Exercise - 04/15/19 0815      Transfers   Transfers  Sit to Stand;Stand to Sit    Sit to Stand  6: Modified independent (Device/Increase time);With upper extremity assist;From chair/3-in-1;From bed    Stand to Sit  6: Modified independent (Device/Increase time);With upper extremity assist;To bed;To chair/3-in-1      Ambulation/Gait   Ambulation/Gait  Yes    Ambulation/Gait Assistance  4: Min guard;4: Min assist    Ambulation/Gait Assistance Details  cues on posture, to increase base of support and for increased step length with gait. Pt needing increased assistance as gait progress with increased veering/unsteadiness with occasional scissoring of gait noted.     Ambulation Distance (Feet)  115  Feet   x2   Assistive device  Rolling walker;Straight cane    Gait Pattern  Step-through pattern;Decreased hip/knee flexion - left    Ambulation Surface  Level;Indoor      Knee/Hip Exercises: Sidelying   Hip ABduction  AAROM;Strengthening;Left;1 set;10 reps;Limitations    Hip ABduction Limitations  assist needed to stay in correct position and min assist to lift her leg.     Clams  right sidelying: left clamshell for 10 reps with cues/assist for correct form, limited lifting height noted with reps.       Manual Therapy   Manual therapy comments  foam roll to gluts/IT band due to muscular tightess and new onset of pain.           Balance Exercises - 04/15/19 0829      Balance Exercises: Standing   Stepping Strategy  Anterior;Posterior;UE support;10 reps      Balance Exercises: Standing   Rebounder Limitations  stepping strategy: left LE fwd/bwd stepping over red foam beam x 10 reps with cues on increased step height/lenght, progressed to fwd/bwd stepping over half foam roll for 10 reps. no UE support with min to mod assist needed for balance, cues on posture and weight shifting as well.           PT Short Term Goals - 04/02/19 1218      PT SHORT TERM GOAL #1   Title  Pt will be independent with progressive HEP for strength and balance to continue gains on own.    Time  4    Period  Weeks    Status  New    Target Date  05/02/19      PT SHORT TERM GOAL #2   Title  Pt will increase gait speed from 0.69ms to >0.816m with SPC for improved gait safety.    Time  4    Period  Weeks    Status  New    Target Date  05/02/19      PT SHORT TERM GOAL #3   Title  Pt will increase Berg Balance from 41/56 to >45/56 for improved balance and decreased fall risk.    Time  4    Period  Weeks    Status  New    Target Date  05/02/19      PT SHORT TERM GOAL #4   Title  Pt will ambulate >400' with SPC on level surfaces supervision for improved mobility.    Time  4    Period  Weeks     Status  New    Target Date  05/02/19      PT SHORT TERM GOAL #5   Title  Pt will be able to perform sit to stand without UE support from standard chair  consistently for improved functional strength.    Time  4    Period  Weeks    Status  New    Target Date  05/02/19        PT Long Term Goals - 04/02/19 1221      PT LONG TERM GOAL #1   Title  Pt will ambulate > 500' on varied surfaces with SPC mod I for improved community mobility.    Time  8    Period  Weeks    Status  New    Target Date  06/01/19      PT LONG TERM GOAL #2   Title  Pt will increase Berg from 41/56 to > 48/56 for improved balance and decreased fall risk.    Time  8    Period  Weeks    Status  New    Target Date  06/01/19      PT LONG TERM GOAL #3   Title  Pt will increase 30 sec sit to stand from 7 reps from standard chair with arms to 10 or more for improved functional strength.    Time  8    Period  Weeks    Status  New    Target Date  06/01/19      PT LONG TERM GOAL #4   Title  Pt will decrease TUG from 16 sec with SPC to <14 sec for improved balance and functional mobility.    Time  8    Period  Weeks    Status  New    Target Date  06/01/19            Plan - 04/15/19 5631    Clinical Impression Statement  Today's skilled session initially addressed pt's reported on new onset of left hip/buttock with pt reporting a decrease to 1/10 after manual and ex's. Remainder of session continued to address gait with straight cane and standing balance activities. Increased assitance needed with gait today, possibly due to new onset of hip/buttock pain (up to min assist today, was min guard as last session). Did progress to unsupported standing balance activities with no significant issues reported or noted. Pt's BP elevated today, however with in acceptable ranges to work in PT. Pt and spouse advised to monitor his BP at home. The pt is progressing toward goals and should benefit from continued PT to progress  toward unmet goals.    Personal Factors and Comorbidities  Comorbidity 3+    Comorbidities  CHF, HTN, hyperlipidemia, glomerulonephritis, pancytopenia/chronic anemia, seizure    Examination-Activity Limitations  Stairs;Stand;Squat;Locomotion Level;Transfers;Bed Mobility    Examination-Participation Restrictions  Community Activity;Shop;Driving;Cleaning;Yard Work    [employer]chant [employer] officer  Evolving/Moderate complexity    Rehab Potential  Good    PT Frequency  2x / week    PT Duration  8 weeks    PT Treatment/Interventions  DME Instruction;ADLs/Self Care Home Management;Gait training;Stair training;Functional mobility training;Therapeutic activities;Therapeutic exercise;Balance training;Orthotic Fit/Training;Patient/family education;Neuromuscular re-education;Wheelchair mobility training;Passive range of motion;Manual techniques    PT Next Visit Plan  Monitor BP with treatment. Continue gait with SPC adding in dynamic gait activities (head turns, obstacles). Standing balance and strengthening to left LE. Starting to get some left DF activation.    PT Home Exercise Plan  Access Code: [email or web address]    Consulted and Agree with Plan of Care  Patient    Family Member Consulted  wife       Patient will benefit from skilled therapeutic intervention in order to improve  the following deficits and impairments:  Abnormal gait, Difficulty walking, Decreased balance, Decreased mobility, Decreased strength, Decreased knowledge of use of DME, Decreased endurance, Decreased activity tolerance, Decreased range of motion, Pain, Increased muscle spasms  Visit Diagnosis: Muscle weakness (generalized)  Other abnormalities of gait and mobility  Hemiplegia and hemiparesis following cerebral infarction affecting left non-dominant side [facility])     Problem List Patient Active Problem List   Diagnosis Date Noted  . 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 Acute blood loss anemia   . Steroid-induced hyperglycemia   . Acute on  chronic anemia   . Seizures ([place])   . Spastic hemiparesis ([place])   . 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 Hypertension   . Thrombocytopenia ([place])   . Acute unilateral cerebral infarction in a watershed distribution [facility]) 01/15/2019  . Weakness   . CVA (cerebral vascular accident) ([place]) 01/12/2019  . Malnutrition of moderate degree 07/31/2018  . Elevated serum protein level   . Multiple myeloma ([place])   . Pancytopenia ([place])   . Normocytic normochromic anemia 07/16/2018  . Acute kidney failure ([place]) 07/16/2018  . NICM (nonischemic cardiomyopathy) ([place]) 11/12/2013  . History of ETOH abuse 11/12/2013  . Chronic combined systolic and diastolic CHF (congestive heart failure) ([place]) 11/12/2013  . Congestive dilated cardiomyopathy ([place]) 11/04/2013  . Malignant hypertension     [place]ow [place], [place], [facility] [address], [place], [place] [address] [phone] 04/15/19, 1:34 PM   Name: Jeremy Grant MRN: [id number] Date of Birth: March 18, 1961

## 2019-04-15 NOTE — Therapy (Signed)
[facility] [address] [place], [place], [address] Phone: [phone]   Fax:  [phone]  Occupational Therapy Treatment  Patient Details  Name: Jeremy Grant MRN: [id number] Date of Birth: [birth year] No data recorded  Encounter Date: 04/15/2019  OT End of Session - 04/15/19 0847    Visit Number  2    Number of Visits  13    Date for OT Re-Evaluation  05/24/19    Authorization Type  BC/BS    OT Start Time  [phone]    OT Stop Time  0930    OT Time Calculation (min)  44 min    Activity Tolerance  Patient tolerated treatment well    Behavior During Therapy  [facility] for tasks assessed/performed       Past Medical History:  Diagnosis Date  . Arthritis   . CHF (congestive heart failure) ([place])   . Combined systolic and diastolic cardiac dysfunction    Echo 11/03/2013 EF 16%, grade 3 diastolic dysfunction  . Hypertension   . NICM (nonischemic cardiomyopathy) ([place])    a. L/RHC (11/05/13): RA: 3, RV 52/5, PA 49/19 (31), PCWP 10, AO 166/93, PA 67%, Fick CO/CI: 5.71/2.97, Lmain: normal, LAD: large, without signficant dz, first diagonal has 20% dz at ostium, LCx: normal, RCA: 30% stenosis at the bifurcation of PDA and PLOM    Past Surgical History:  Procedure Laterality Date  . BIOPSY  01/31/2019   Procedure: BIOPSY;  Surgeon: Otis Brace, MD;  Location: [facility]OSCOPY;  Service: Gastroenterology;;  . ESOPHAGOGASTRODUODENOSCOPY N/A 01/31/2019   Procedure: ESOPHAGOGASTRODUODENOSCOPY (EGD);  Surgeon: Otis Brace, MD;  Location: [facility] ENDOSCOPY;  Service: Gastroenterology;  Laterality: N/A;  . IR FLUORO GUIDE CV LINE RIGHT  07/25/2018  . IR US GUIDE VASC ACCESS RIGHT  07/25/2018  . LEFT AND RIGHT HEART CATHETERIZATION WITH CORONARY ANGIOGRAM N/A 11/05/2013   Procedure: LEFT AND RIGHT HEART CATHETERIZATION WITH CORONARY ANGIOGRAM;  Surgeon: Peter M Martinique, MD;  Location: [facility] CATH LAB;  Service: Cardiovascular;  Laterality: N/A;   . RIGHT HEART CATHETERIZATION N/A 01/14/2014   Procedure: RIGHT HEART CATH;  Surgeon: Larey Dresser, MD;  Location: [facility] CATH LAB;  Service: Cardiovascular;  Laterality: N/A;    There were no vitals filed for this visit.  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 PMH: acute kidney failure March 2020, Shingles in May 2020, CHF, HTN, CKD(IV), HLD, auto immune disease    Limitations  seizures (on meds now - has not had any since d/c from hospital)    Currently in Pain?  No/denies          Standing with RUE support to perform functional reaching with LUE to place/remove clothespins with 1-8lb resist on vertical pole with min A/mod cueing for midline alignment and posture due to decr wt. Bearing on LLE.    Standing at counter with RUE support and  min A/cueing for balance, performing reaching lateral and overhead with LUE to incr wt. Shift to the L.      OT Education - 04/15/19 0914    Education Details  Initial HEP--see pt instructions    Person(s) Educated  Patient    Methods  Explanation;Demonstration;Verbal cues;Handout    Comprehension  Verbalized understanding;Returned demonstration;Verbal cues required  OT Short Term Goals - 03/24/19 1449      OT SHORT TERM GOAL #1   Title  Independent with LUE HEP    Time  3    Period  Weeks    Status  New      OT SHORT TERM GOAL #2   Title  Grip strength Lt hand to be 60 lbs or greater    Baseline  Lt = 58 lbs (Rt = 64 lbs)    Time  3    Period  Weeks    Status  New      OT SHORT TERM GOAL #3   Title  Pt to stand to fold clothes and wash dishes x 10 min w/o rest    Time  3    Period  Weeks    Status  New      OT SHORT TERM GOAL #4   Title  Pt to consistently go into kitchen and make simple snack/sandwich at distant sup level    Time   3    Period  Weeks    Status  New        OT Long Term Goals - 03/24/19 1452      OT LONG TERM GOAL #1   Title  Independent with updated HEP    Time  6    Period  Weeks    Status  New      OT LONG TERM GOAL #2   Title  Pt able to place 3 lb object on high level shelf LUE 5/5 trials w/ min compensations and no pain    Time  6    Period  Weeks    Status  New      OT LONG TERM GOAL #3   Title  Pt to make simple breakfast mod I level safely using DME prn and fall prevention techniques    Time  6    Period  Weeks    Status  New            Plan - 04/15/19 0915    Clinical Impression Statement  Pt with decr posture impacing LUE shoulder ROM and decr midline alignment affecting balance for standing tasks.    Occupational performance deficits (Please refer to evaluation for details):  IADL's;Leisure;Social Participation;Work    Marketing executive / Function / Physical Skills  ADL;IADL;Endurance;Body mechanics;Mobility;Strength;Coordination;UE functional use;Pain    Rehab Potential  Good    Clinical Decision Making  Several treatment options, min-mod task modification necessary    Comorbidities Affecting Occupational Performance:  Presence of comorbidities impacting occupational performance    Comorbidities impacting occupational performance description:  chronic anemia, recent seizures, auto immune dz    Modification or Assistance to Complete Evaluation   Min-Moderate modification of tasks or assist with assess necessary to complete eval    OT Frequency  2x / week    OT Duration  6 weeks   or 12 visits over 8 weeks if pt cannot meet frequency d/t scheduling conflicts   OT Treatment/Interventions  Self-care/ADL training;Therapeutic exercise;Functional Mobility Training;Neuromuscular education;Energy conservation;Therapeutic activities;Coping strategies training;DME and/or AE instruction;Passive range of motion;Patient/family education;Moist Heat    Plan  review HEP, standing balance,  posture, midline alignment    Consulted and Agree with Plan of Care  Patient       Patient will benefit from skilled therapeutic intervention in order to improve the following deficits and impairments:   Body Structure / Function / Physical Skills: ADL, IADL, Endurance, Body  mechanics, Mobility, Strength, Coordination, UE functional use, Pain       Visit Diagnosis: Hemiplegia and hemiparesis following cerebral infarction affecting left non-dominant side (HCC)  Unsteadiness on feet  Muscle weakness (generalized)    Problem List Patient Active Problem List   Diagnosis Date Noted  . 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 Acute blood loss anemia   . Steroid-induced hyperglycemia   . Acute on chronic anemia   . Seizures ([place])   . Spastic hemiparesis ([place])   . 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 Hypertension   . Thrombocytopenia ([place])   . Acute unilateral cerebral infarction in a watershed distribution [facility]) 01/15/2019  . Weakness   . CVA (cerebral vascular accident) ([place]) 01/12/2019  . Malnutrition of moderate degree 07/31/2018  . Elevated serum protein level   . Multiple myeloma ([place])   . Pancytopenia ([place])   . Normocytic normochromic anemia 07/16/2018  . Acute kidney failure ([place]) 07/16/2018  . NICM (nonischemic cardiomyopathy) ([place]) 11/12/2013  . History of ETOH abuse 11/12/2013  . Chronic combined systolic and diastolic CHF (congestive heart failure) ([place]) 11/12/2013  . Congestive dilated cardiomyopathy ([place]) 11/04/2013  . Malignant hypertension     [facility] 04/15/2019, 6:06 PM  [facility] [address] [place], [place], [address] Phone: [phone]   Fax:  [phone]  Name: HYDER DEMAN MRN: [id number] Date of Birth: [birth year]   Vianne Bulls, OTR/L [facility] [address] [place], [place]  [address] [phone] phone [phone] 04/15/19 6:21 PM

## 2019-04-15 NOTE — Patient Instructions (Signed)
     ROM: Abduction - Wand   SIT, Holding wand with left hand palm up, push wand directly out to side, leading with other hand palm down, until stretch is felt. Hold 5 seconds. Repeat 10 times per set. Do 2 sessions per day.  Look straight ahead. (Lying down)    [employer] - Standing   Sit, With arms straight, hold cane forward at waist. Raise cane above head. Hold 3 seconds.  Good posture, look straight ahead. Repeat 10 times. Do 2 times per day.    Scapular Retraction (Standing)     Stand and hold walker with right hand.  Bring left arm back (elbow straight) to pull shoulder blades back.  Chest and head up. Repeat 10 times per set.  Do 2 sessions per day.     Work on sitting up tall, pulling both shoulder blades back and sit up away from back of chair.  Hold 3-5 min (during commercials).  Repeat 5 times during the day.     11. Grip Strengthening (Resistive Putty)   Squeeze putty using thumb and all fingers. Repeat 15 times. Do 2 sessions per day.   Extension (Assistive Putty)   Roll putty back and forth, being sure to use all fingertips. Repeat 3 times. Do 1-2 sessions per day.  Then pinch as below.   Palmar Pinch Strengthening (Resistive Putty)   Pinch putty between thumb and each fingertip in turn after rolling out

## 2019-04-17 ENCOUNTER — Ambulatory Visit: Payer: BC Managed Care – PPO | Admitting: Physical Therapy

## 2019-04-17 ENCOUNTER — Other Ambulatory Visit: Payer: Self-pay

## 2019-04-17 ENCOUNTER — Encounter: Payer: Self-pay | Admitting: Physical Therapy

## 2019-04-17 ENCOUNTER — Ambulatory Visit: Payer: BC Managed Care – PPO | Admitting: Occupational Therapy

## 2019-04-17 ENCOUNTER — Encounter: Payer: Self-pay | Admitting: Occupational Therapy

## 2019-04-17 VITALS — BP 154/88

## 2019-04-17 DIAGNOSIS — R2681 Unsteadiness on feet: Secondary | ICD-10-CM

## 2019-04-17 DIAGNOSIS — R2689 Other abnormalities of gait and mobility: Secondary | ICD-10-CM

## 2019-04-17 DIAGNOSIS — I69354 Hemiplegia and hemiparesis following cerebral infarction affecting left non-dominant side: Secondary | ICD-10-CM

## 2019-04-17 DIAGNOSIS — M6281 Muscle weakness (generalized): Secondary | ICD-10-CM | POA: Diagnosis not present

## 2019-04-17 DIAGNOSIS — I6389 Other cerebral infarction: Secondary | ICD-10-CM

## 2019-04-17 NOTE — Therapy (Signed)
[facility] [address] [place], [place], [address] Phone: [phone]   Fax:  [phone]  Physical Therapy Treatment  Patient Details  Name: Jeremy Grant MRN: [id number] Date of Birth: [birth year] Referring Provider (PT): Dr. Letta Pate   Encounter Date: 04/17/2019  PT End of Session - 04/17/19 0849    Visit Number  16    Number of Visits  32    Date for PT Re-Evaluation  06/11/19    Authorization Type  BCBS    PT Start Time  0845    PT Stop Time  0927    PT Time Calculation (min)  42 min    Equipment Utilized During Treatment  Gait belt   left AFO   Activity Tolerance  Patient tolerated treatment well    Behavior During Therapy  [facility] for tasks assessed/performed       Past Medical History:  Diagnosis Date  . Arthritis   . CHF (congestive heart failure) ([place])   . Combined systolic and diastolic cardiac dysfunction    Echo 11/03/2013 EF 62%, grade 3 diastolic dysfunction  . Hypertension   . NICM (nonischemic cardiomyopathy) ([place])    a. L/RHC (11/05/13): RA: 3, RV 52/5, PA 49/19 (31), PCWP 10, AO 166/93, PA 67%, Fick CO/CI: 5.71/2.97, Lmain: normal, LAD: large, without signficant dz, first diagonal has 20% dz at ostium, LCx: normal, RCA: 30% stenosis at the bifurcation of PDA and PLOM    Past Surgical History:  Procedure Laterality Date  . BIOPSY  01/31/2019   Procedure: BIOPSY;  Surgeon: Otis Brace, MD;  Location: [facility] ENDOSCOPY;  Service: Gastroenterology;;  . ESOPHAGOGASTRODUODENOSCOPY N/A 01/31/2019   Procedure: ESOPHAGOGASTRODUODENOSCOPY (EGD);  Surgeon: Otis Brace, MD;  Location: [facility] ENDOSCOPY;  Service: Gastroenterology;  Laterality: N/A;  . IR FLUORO GUIDE CV LINE RIGHT  07/25/2018  . IR US GUIDE VASC ACCESS RIGHT  07/25/2018  . LEFT AND RIGHT HEART CATHETERIZATION WITH CORONARY ANGIOGRAM N/A 11/05/2013   Procedure: LEFT AND RIGHT HEART CATHETERIZATION WITH CORONARY ANGIOGRAM;  Surgeon: Peter M  Martinique, MD;  Location[facility] CATH LAB;  Service: Cardiovascular;  Laterality: N/A;  . RIGHT HEART CATHETERIZATION N/A 01/14/2014   Procedure: RIGHT HEART CATH;  Surgeon: Larey Dresser, MD;  Location: [facility] CATH LAB;  Service: Cardiovascular;  Laterality: N/A;    Vitals:   04/17/19 0848  BP: (!) 154/88    Subjective Assessment - 04/17/19 0848    Subjective  Reports his urologist placed him back on Predisone for next 3 months at least, unsure of the dosage. To bring this info next session. No falls or pain to report. Hip pain has stopped.    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 8/31-9/3 then inpatient rehab 9/3-9/23.PMH: CHF, HTN, hyperlipidemia, glomerulonephritis, pancytopenia/chronic anemia, seizure left-sided post stroke    Patient Stated Goals  Pt wants to be able to walk again.    Pain Score  0-No pain            [facility] Adult PT Treatment/Exercise - 04/17/19 0853      Transfers   Transfers  Sit to Stand;Stand to Sit    Sit to Stand  6: Modified independent (Device/Increase time);With upper extremity assist;From chair/3-in-1;From bed    Stand to Sit  6: Modified independent (Device/Increase time);With upper extremity assist;To bed;To chair/3-in-1      Ambulation/Gait   Ambulation/Gait  Yes    Ambulation/Gait Assistance  4: Min  guard;4: Min assist    Ambulation/Gait Assistance Details  used RW to exit. use of straight cane in session- cues on posture, base of support and sequencing with cane. increased assistance needed as pt fatigued with gait.     Ambulation Distance (Feet)  230 Feet   x1, plus around gym with session   Assistive device  Rolling walker;Straight cane    Gait Pattern  Step-through pattern;Decreased hip/knee flexion - left    Ambulation Surface  Level;Indoor      Neuro Re-ed    Neuro Re-ed Details   for strengthening/muscle re-ed: seated with right foot on 2 inch box-  sit<>stands for 10 reps with emphasis on full standing and slow, controlled descent with sitting down.; standing with UE support, right foot forward on 4 inch box- left LE mini squats for 10 reps, min guard assist for balance.           Balance Exercises - 04/17/19 0917      Balance Exercises: Standing   Rockerboard  Anterior/posterior;Lateral;Head turns;EO;EC;30 seconds;10 reps      Balance Exercises: Standing   Rebounder Limitations  performed both ways on the balance board with no to light touch to bars for balance: rocking the board with EO with hand support, progressing to no hands, progressing to EC; then holding the board steady- alternating UE raises, bil UE raises for 10 reps each with EO, then holding the board steady for EC no head movements, then with EO head movements left<>right, then up<>down. min to mod assist for balance, cues on posture, left knee extension, and weight shifting to assist with balance. manual assist needed for left knee extension as pt began to fatigue.                                PT Short Term Goals - 04/02/19 1218      PT SHORT TERM GOAL #1   Title  Pt will be independent with progressive HEP for strength and balance to continue gains on own.    Time  4    Period  Weeks    Status  New    Target Date  05/02/19      PT SHORT TERM GOAL #2   Title  Pt will increase gait speed from 0.22ms to >0.849m with SPC for improved gait safety.    Time  4    Period  Weeks    Status  New    Target Date  05/02/19      PT SHORT TERM GOAL #3   Title  Pt will increase Berg Balance from 41/56 to >45/56 for improved balance and decreased fall risk.    Time  4    Period  Weeks    Status  New    Target Date  05/02/19      PT SHORT TERM GOAL #4   Title  Pt will ambulate >400' with SPC on level surfaces supervision for improved mobility.    Time  4    Period  Weeks    Status  New    Target Date  05/02/19      PT SHORT TERM GOAL #5   Title  Pt will be  able to perform sit to stand without UE support from standard chair consistently for improved functional strength.    Time  4    Period  Weeks    Status  New  Target Date  05/02/19        PT Long Term Goals - 04/02/19 1221      PT LONG TERM GOAL #1   Title  Pt will ambulate > 500' on varied surfaces with SPC mod I for improved community mobility.    Time  8    Period  Weeks    Status  New    Target Date  06/01/19      PT LONG TERM GOAL #2   Title  Pt will increase Berg from 41/56 to > 48/56 for improved balance and decreased fall risk.    Time  8    Period  Weeks    Status  New    Target Date  06/01/19      PT LONG TERM GOAL #3   Title  Pt will increase 30 sec sit to stand from 7 reps from standard chair with arms to 10 or more for improved functional strength.    Time  8    Period  Weeks    Status  New    Target Date  06/01/19      PT LONG TERM GOAL #4   Title  Pt will decrease TUG from 16 sec with SPC to <14 sec for improved balance and functional mobility.    Time  8    Period  Weeks    Status  New    Target Date  06/01/19            Plan - 04/17/19 0849    Clinical Impression Statement  Today's skilled session continued to focus on gait with cane, left LE strengthening and balance reactions with rest breaks needed due to fatigue. The pt is making steady progress toward goals and should benefit from continued PT to progress toward unmet goals.    Personal Factors and Comorbidities  Comorbidity 3+    Comorbidities  CHF, HTN, hyperlipidemia, glomerulonephritis, pancytopenia/chronic anemia, seizure    Examination-Activity Limitations  Stairs;Stand;Squat;Locomotion Level;Transfers;Bed Mobility    Examination-Participation Restrictions  Community Activity;Shop;Driving;Cleaning;Yard Work    Merchant navy officer  Evolving/Moderate complexity    Rehab Potential  Good    PT Frequency  2x / week    PT Duration  8 weeks    PT Treatment/Interventions   DME Instruction;ADLs/Self Care Home Management;Gait training;Stair training;Functional mobility training;Therapeutic activities;Therapeutic exercise;Balance training;Orthotic Fit/Training;Patient/family education;Neuromuscular re-education;Wheelchair mobility training;Passive range of motion;Manual techniques    PT Next Visit Plan  Monitor BP with treatment. Continue gait with SPC adding in dynamic gait activities (head turns, obstacles). Standing balance and strengthening to left LE. Starting to get some left DF activation.    PT Home Exercise Plan  Access Code: [email or web address]    Consulted and Agree with Plan of Care  Patient    Family Member Consulted  wife       Patient will benefit from skilled therapeutic intervention in order to improve the following deficits and impairments:  Abnormal gait, Difficulty walking, Decreased balance, Decreased mobility, Decreased strength, Decreased knowledge of use of DME, Decreased endurance, Decreased activity tolerance, Decreased range of motion, Pain, Increased muscle spasms  Visit Diagnosis: Hemiplegia and hemiparesis following cerebral infarction affecting left non-dominant side (HCC)  Unsteadiness on feet  Muscle weakness (generalized)  Other abnormalities of gait and mobility     Problem List Patient Active Problem List   Diagnosis Date Noted  . 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 Acute blood loss anemia   . Steroid-induced hyperglycemia   . Acute on chronic anemia   .  Seizures ([place])   . Spastic hemiparesis ([place])   . 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 Hypertension   . Thrombocytopenia ([place])   . Acute unilateral cerebral infarction in a watershed distribution [facility]) 01/15/2019  . Weakness   . CVA (cerebral vascular accident) ([place]) 01/12/2019  . Malnutrition of moderate degree 07/31/2018  . Elevated serum protein level   . Multiple myeloma ([place])   . Pancytopenia ([place])   . Normocytic normochromic anemia 07/16/2018  . Acute kidney failure ([place]) 07/16/2018   . NICM (nonischemic cardiomyopathy) ([place]) 11/12/2013  . History of ETOH abuse 11/12/2013  . Chronic combined systolic and diastolic CHF (congestive heart failure) ([place]) 11/12/2013  . Congestive dilated cardiomyopathy ([place]) 11/04/2013  . Malignant hypertension     [place], [place], [facility] [address], [place], [place] [address] [phone] 04/17/19, 9:29 PM   Name: Jeremy Grant MRN: [id number] Date of Birth: 06/11/1960

## 2019-04-17 NOTE — Therapy (Signed)
[facility] [address] [place], [place], [address] Phone: [phone]   Fax:  [phone]  Occupational Therapy Treatment  Patient Details  Name: Jeremy Grant MRN: [id number] Date of Birth: Mar 04, 1961 No data recorded  Encounter Date: 04/17/2019  OT End of Session - 04/17/19 0826    Visit Number  3    Number of Visits  13    Date for OT Re-Evaluation  05/24/19    Authorization Type  BC/BS--[place] deductible and oop met in full, prior auth required    OT Start Time  0807    OT Stop Time  0845    OT Time Calculation (min)  38 min    Activity Tolerance  Patient tolerated treatment well    Behavior During Therapy  [facility] for tasks assessed/performed       Past Medical History:  Diagnosis Date  . Arthritis   . CHF (congestive heart failure) ([place])   . Combined systolic and diastolic cardiac dysfunction    Echo 11/03/2013 EF 62%, grade 3 diastolic dysfunction  . Hypertension   . NICM (nonischemic cardiomyopathy) ([place])    a. L/RHC (11/05/13): RA: 3, RV 52/5, PA 49/19 (31), PCWP 10, AO 166/93, PA 67%, Fick CO/CI: 5.71/2.97, Lmain: normal, LAD: large, without signficant dz, first diagonal has 20% dz at ostium, LCx: normal, RCA: 30% stenosis at the bifurcation of PDA and PLOM    Past Surgical History:  Procedure Laterality Date  . BIOPSY  01/31/2019   Procedure: BIOPSY;  Surgeon: Otis Brace, MD;  Location: [facility] ENDOSCOPY;  Service: Gastroenterology;;  . ESOPHAGOGASTRODUODENOSCOPY N/A 01/31/2019   Procedure: ESOPHAGOGASTRODUODENOSCOPY (EGD);  Surgeon: Otis Brace, MD;  Location: [facility] ENDOSCOPY;  Service: Gastroenterology;  Laterality: N/A;  . IR FLUORO GUIDE CV LINE RIGHT  07/25/2018  . IR US GUIDE VASC ACCESS RIGHT  07/25/2018  . LEFT AND RIGHT HEART CATHETERIZATION WITH CORONARY ANGIOGRAM N/A 11/05/2013   Procedure: LEFT AND RIGHT HEART CATHETERIZATION WITH CORONARY ANGIOGRAM;  Surgeon: Peter M Martinique, MD;   Location: [facility] CATH LAB;  Service: Cardiovascular;  Laterality: N/A;  . RIGHT HEART CATHETERIZATION N/A 01/14/2014   Procedure: RIGHT HEART CATH;  Surgeon: Larey Dresser, MD;  Location: [facility] CATH LAB;  Service: Cardiovascular;  Laterality: N/A;    There were no vitals filed for this visit.  Subjective Assessment - 04/17/19 0801    Patient is accompanied by:  Family member   wife [name])   Pertinent History  watershed infarcts 01/12/19 including corpus collosum, seizures, chronic anemia. PMH: acute kidney failure March 2020, Shingles in May 2020, CHF, HTN, CKD(IV), HLD, auto immune disease    Limitations  seizures (on meds now - has not had any since d/c from hospital)    Currently in Pain?  No/denies        Sitting, picking up blocks using sustained grip strength with gripper on level 2 with mod difficulty/drops and incorporation LUE reaching in ER/abduction for improved wt. Shift/posture and shoulder strength.  Arm bike x5 min level 1 for reciprocal movement/conditioning without rest, forward/backwards   Sitting, wt. Bearing through L hand with on arm body movements for incr wt. Shift to L and incr scapular stability/LUE strength with min cueing for improved positioning.  In standing, functional reaching with LUE across body, laterally to mid-high range to facilitate lateral wt. Shift and trunk rotation for incr balance/activity tolerance for IADLs.  Supine, closed-chain shoulder flex, abduction, chest press with cane with BUEs with min facilitation/cueing for positioning  and incr stretch.  Reviewed shoulder HEP:  Sitting, shoulder flex, abduction with cane with min v.c.  Standing, shoulder ext/scapular retraction with min v.c.      OT Short Term Goals - 03/24/19 1449      OT SHORT TERM GOAL #1   Title  Independent with LUE HEP    Time  3    Period  Weeks    Status  New      OT SHORT TERM GOAL #2   Title  Grip strength Lt hand to be 60 lbs or greater    Baseline  Lt = 58 lbs  (Rt = 64 lbs)    Time  3    Period  Weeks    Status  New      OT SHORT TERM GOAL #3   Title  Pt to stand to fold clothes and wash dishes x 10 min w/o rest    Time  3    Period  Weeks    Status  New      OT SHORT TERM GOAL #4   Title  Pt to consistently go into kitchen and make simple snack/sandwich at distant sup level    Time  3    Period  Weeks    Status  New        OT Long Term Goals - 03/24/19 1452      OT LONG TERM GOAL #1   Title  Independent with updated HEP    Time  6    Period  Weeks    Status  New      OT LONG TERM GOAL #2   Title  Pt able to place 3 lb object on high level shelf LUE 5/5 trials w/ min compensations and no pain    Time  6    Period  Weeks    Status  New      OT LONG TERM GOAL #3   Title  Pt to make simple breakfast mod I level safely using DME prn and fall prevention techniques    Time  6    Period  Weeks    Status  New            Plan - 04/17/19 0827    Clinical Impression Statement  Pt demo improvement with posture and LUE ROM and midline alignment/wt. shift to the L today, but continues to need min v.c. for HEP for improved positioning/technique.    Occupational performance deficits (Please refer to evaluation for details):  IADL's;Leisure;Social Participation;Work    Marketing executive / Function / Physical Skills  ADL;IADL;Endurance;Body mechanics;Mobility;Strength;Coordination;UE functional use;Pain    Rehab Potential  Good    Clinical Decision Making  Several treatment options, min-mod task modification necessary    Comorbidities Affecting Occupational Performance:  Presence of comorbidities impacting occupational performance    Comorbidities impacting occupational performance description:  chronic anemia, recent seizures, auto immune dz    Modification or Assistance to Complete Evaluation   Min-Moderate modification of tasks or assist with assess necessary to complete eval    OT Frequency  2x / week    OT Duration  6 weeks   or  12 visits over 8 weeks if pt cannot meet frequency d/t scheduling conflicts   OT Treatment/Interventions  Self-care/ADL training;Therapeutic exercise;Functional Mobility Training;Neuromuscular education;Energy conservation;Therapeutic activities;Coping strategies training;DME and/or AE instruction;Passive range of motion;Patient/family education;Moist Heat    Plan  simple IADL, standing balance, posture, LUE functional reach    Consulted and Agree  with Plan of Care  Patient       Patient will benefit from skilled therapeutic intervention in order to improve the following deficits and impairments:   Body Structure / Function / Physical Skills: ADL, IADL, Endurance, Body mechanics, Mobility, Strength, Coordination, UE functional use, Pain       Visit Diagnosis: Hemiplegia and hemiparesis following cerebral infarction affecting left non-dominant side (HCC)  Unsteadiness on feet  Muscle weakness (generalized)  Other abnormalities of gait and mobility  Acute unilateral cerebral infarction in a watershed distribution [facility])    Problem List Patient Active Problem List   Diagnosis Date Noted  . 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 Acute blood loss anemia   . Steroid-induced hyperglycemia   . Acute on chronic anemia   . Seizures ([place])   . Spastic hemiparesis ([place])   . 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 Hypertension   . Thrombocytopenia ([place])   . Acute unilateral cerebral infarction in a watershed distribution [facility]) 01/15/2019  . Weakness   . CVA (cerebral vascular accident) ([place]) 01/12/2019  . Malnutrition of moderate degree 07/31/2018  . Elevated serum protein level   . Multiple myeloma ([place])   . Pancytopenia ([place])   . Normocytic normochromic anemia 07/16/2018  . Acute kidney failure ([place]) 07/16/2018  . NICM (nonischemic cardiomyopathy) ([place]) 11/12/2013  . History of ETOH abuse 11/12/2013  . Chronic combined systolic and diastolic CHF (congestive heart failure) ([place]) 11/12/2013  .  Congestive dilated cardiomyopathy ([place]) 11/04/2013  . Malignant hypertension     [facility] 04/17/2019, 8:55 AM  [facility] [address] [place], [place], [address] Phone: [phone]   Fax:  [phone]  Name: Jeremy Grant MRN: [id number] Date of Birth: 05-19-1960   Vianne Bulls, OTR/L [facility] [address] [place], [place]  [address] [phone] phone [phone] 04/17/19 8:55 AM

## 2019-04-21 ENCOUNTER — Ambulatory Visit: Payer: BC Managed Care – PPO

## 2019-04-21 ENCOUNTER — Other Ambulatory Visit: Payer: Self-pay

## 2019-04-21 ENCOUNTER — Ambulatory Visit: Payer: BC Managed Care – PPO | Admitting: Occupational Therapy

## 2019-04-21 DIAGNOSIS — M6281 Muscle weakness (generalized): Secondary | ICD-10-CM

## 2019-04-21 DIAGNOSIS — R2681 Unsteadiness on feet: Secondary | ICD-10-CM

## 2019-04-21 DIAGNOSIS — I69354 Hemiplegia and hemiparesis following cerebral infarction affecting left non-dominant side: Secondary | ICD-10-CM

## 2019-04-21 DIAGNOSIS — R2689 Other abnormalities of gait and mobility: Secondary | ICD-10-CM

## 2019-04-21 NOTE — Therapy (Signed)
[facility] [address] [place], [place], [address] Phone: [phone]   Fax:  [phone]  Occupational Therapy Treatment  Patient Details  Name: Jeremy Grant MRN: [id number] Date of Birth: December 04, 1960 No data recorded  Encounter Date: 04/21/2019  OT End of Session - 04/21/19 0921    Visit Number  4    Number of Visits  13    Date for OT Re-Evaluation  05/24/19    Authorization Type  BC/BS--[place] deductible and oop met in full, prior auth required    OT Start Time  0847    OT Stop Time  0930    OT Time Calculation (min)  43 min    Activity Tolerance  Patient tolerated treatment well    Behavior During Therapy  [facility] for tasks assessed/performed       Past Medical History:  Diagnosis Date  . Arthritis   . CHF (congestive heart failure) ([place])   . Combined systolic and diastolic cardiac dysfunction    Echo 11/03/2013 EF 23%, grade 3 diastolic dysfunction  . Hypertension   . NICM (nonischemic cardiomyopathy) ([place])    a. L/RHC (11/05/13): RA: 3, RV 52/5, PA 49/19 (31), PCWP 10, AO 166/93, PA 67%, Fick CO/CI: 5.71/2.97, Lmain: normal, LAD: large, without signficant dz, first diagonal has 20% dz at ostium, LCx: normal, RCA: 30% stenosis at the bifurcation of PDA and PLOM    Past Surgical History:  Procedure Laterality Date  . BIOPSY  01/31/2019   Procedure: BIOPSY;  Surgeon: Otis Brace, MD;  Location: [facility] ENDOSCOPY;  Service: Gastroenterology;;  . ESOPHAGOGASTRODUODENOSCOPY N/A 01/31/2019   Procedure: ESOPHAGOGASTRODUODENOSCOPY (EGD);  Surgeon: Otis Brace, MD;  Location: [facility] ENDOSCOPY;  Service: Gastroenterology;  Laterality: N/A;  . IR FLUORO GUIDE CV LINE RIGHT  07/25/2018  . IR US GUIDE VASC ACCESS RIGHT  07/25/2018  . LEFT AND RIGHT HEART CATHETERIZATION WITH CORONARY ANGIOGRAM N/A 11/05/2013   Procedure: LEFT AND RIGHT HEART CATHETERIZATION WITH CORONARY ANGIOGRAM;  Surgeon: Peter M Martinique, MD;   Location: [facility] CATH LAB;  Service: Cardiovascular;  Laterality: N/A;  . RIGHT HEART CATHETERIZATION N/A 01/14/2014   Procedure: RIGHT HEART CATH;  Surgeon: Larey Dresser, MD;  Location: [facility] CATH LAB;  Service: Cardiovascular;  Laterality: N/A;    There were no vitals filed for this visit.  Subjective Assessment - 04/21/19 0851    Patient is accompanied by:  Family member   wife   Pertinent History  watershed infarcts 01/12/19 including corpus collosum, seizures, chronic anemia. PMH: acute kidney failure March 2020, Shingles in May 2020, CHF, HTN, CKD(IV), HLD, auto immune disease    Limitations  seizures (on meds now - has not had any since d/c from hospital)    Currently in Pain?  No/denies      Reviewed goals and progress to date.  Functional mobility in kitchen retrieving items from low and higher cabinets w/ cues for safety, walker negotiation, and fall prevention. Simulated laundry task by placing laundry in washer, transferring to dryer then given recommendations for getting out of dryer from w/c level. Also recommended getting items out of refrigerator from w/c level to transport to countertop (for safety).  UBE x 8 min, level 3 resistance for UE strength and endurance.                       OT Short Term Goals - 04/21/19 0922      OT SHORT TERM GOAL #1  Title  Independent with LUE HEP    Time  3    Period  Weeks    Status  Achieved      OT SHORT TERM GOAL #2   Title  Grip strength Lt hand to be 60 lbs or greater    Baseline  Lt = 58 lbs (Rt = 64 lbs)    Time  3    Period  Weeks    Status  On-going      OT SHORT TERM GOAL #3   Title  Pt to stand to fold clothes and wash dishes x 10 min w/o rest    Time  3    Period  Weeks    Status  On-going      OT SHORT TERM GOAL #4   Title  Pt to consistently go into kitchen and make simple snack/sandwich at distant sup level    Time  3    Period  Weeks    Status  On-going        OT Long Term Goals -  03/24/19 1452      OT LONG TERM GOAL #1   Title  Independent with updated HEP    Time  6    Period  Weeks    Status  New      OT LONG TERM GOAL #2   Title  Pt able to place 3 lb object on high level shelf LUE 5/5 trials w/ min compensations and no pain    Time  6    Period  Weeks    Status  New      OT LONG TERM GOAL #3   Title  Pt to make simple breakfast mod I level safely using DME prn and fall prevention techniques    Time  6    Period  Weeks    Status  New            Plan - 04/21/19 0272    Clinical Impression Statement  Pt progressing towards goals. Pt w/ decreased endurance and unsteadiness on feet.    Occupational performance deficits (Please refer to evaluation for details):  IADL's;Leisure;Social Participation;Work    Marketing executive / Function / Physical Skills  ADL;IADL;Endurance;Body mechanics;Mobility;Strength;Coordination;UE functional use;Pain    Rehab Potential  Good    Comorbidities impacting occupational performance description:  chronic anemia, recent seizures, auto immune dz    OT Frequency  2x / week    OT Duration  6 weeks    OT Treatment/Interventions  Self-care/ADL training;Therapeutic exercise;Functional Mobility Training;Neuromuscular education;Energy conservation;Therapeutic activities;Coping strategies training;DME and/or AE instruction;Passive range of motion;Patient/family education;Moist Heat    Plan  gripper activity, standing balance, LUE functional reach (next week: issue theraband)    Consulted and Agree with Plan of Care  Patient       Patient will benefit from skilled therapeutic intervention in order to improve the following deficits and impairments:   Body Structure / Function / Physical Skills: ADL, IADL, Endurance, Body mechanics, Mobility, Strength, Coordination, UE functional use, Pain       Visit Diagnosis: Unsteadiness on feet  Hemiplegia and hemiparesis following cerebral infarction affecting left non-dominant side  (HCC)  Muscle weakness (generalized)    Problem List Patient Active Problem List   Diagnosis Date Noted  . 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 Acute blood loss anemia   . Steroid-induced hyperglycemia   . Acute on chronic anemia   . Seizures ([place])   . Spastic hemiparesis ([place])   . ANCA-associated  vasculitis ([place])   . Hypertension   . Thrombocytopenia ([place])   . Acute unilateral cerebral infarction in a watershed distribution [facility]) 01/15/2019  . Weakness   . CVA (cerebral vascular accident) ([place]) 01/12/2019  . Malnutrition of moderate degree 07/31/2018  . Elevated serum protein level   . Multiple myeloma ([place])   . Pancytopenia ([place])   . Normocytic normochromic anemia 07/16/2018  . Acute kidney failure ([place]) 07/16/2018  . NICM (nonischemic cardiomyopathy) ([place]) 11/12/2013  . History of ETOH abuse 11/12/2013  . Chronic combined systolic and diastolic CHF (congestive heart failure) ([place]) 11/12/2013  . Congestive dilated cardiomyopathy ([place]) 11/04/2013  . Malignant hypertension     Carey Bullocks, OTR/L 04/21/2019, 10:39 AM  [facility] [address] [place], [place], [address] Phone: [phone]   Fax:  [phone]  Name: Jeremy Grant MRN: [id number] Date of Birth: 04/22/1961

## 2019-04-21 NOTE — Therapy (Signed)
[facility] [address] [place], [place], [address] Phone: [phone]   Fax:  [phone]  Physical Therapy Treatment  Patient Details  Name: Jeremy Grant MRN: [id number] Date of Birth: 1960-08-26 Referring Provider (PT): Dr. Letta Pate   Encounter Date: 04/21/2019  PT End of Session - 04/21/19 0932    Visit Number  17    Number of Visits  32    Date for PT Re-Evaluation  06/11/19    Authorization Type  BCBS    PT Start Time  0931    PT Stop Time  1012    PT Time Calculation (min)  41 min    Equipment Utilized During Treatment  Gait belt   left AFO   Activity Tolerance  Patient tolerated treatment well    Behavior During Therapy  [facility] for tasks assessed/performed       Past Medical History:  Diagnosis Date  . Arthritis   . CHF (congestive heart failure) ([place])   . Combined systolic and diastolic cardiac dysfunction    Echo 11/03/2013 EF 19%, grade 3 diastolic dysfunction  . Hypertension   . NICM (nonischemic cardiomyopathy) ([place])    a. L/RHC (11/05/13): RA: 3, RV 52/5, PA 49/19 (31), PCWP 10, AO 166/93, PA 67%, Fick CO/CI: 5.71/2.97, Lmain: normal, LAD: large, without signficant dz, first diagonal has 20% dz at ostium, LCx: normal, RCA: 30% stenosis at the bifurcation of PDA and PLOM    Past Surgical History:  Procedure Laterality Date  . BIOPSY  01/31/2019   Procedure: BIOPSY;  Surgeon: Otis Brace, MD;  Location: [facility];  Service: Gastroenterology;;  . ESOPHAGOGASTRODUODENOSCOPY N/A 01/31/2019   Procedure: ESOPHAGOGASTRODUODENOSCOPY (EGD);  Surgeon: Otis Brace, MD;  Location: [facility] ENDOSCOPY;  Service: Gastroenterology;  Laterality: N/A;  . IR FLUORO GUIDE CV LINE RIGHT  07/25/2018  . IR US GUIDE VASC ACCESS RIGHT  07/25/2018  . LEFT AND RIGHT HEART CATHETERIZATION WITH CORONARY ANGIOGRAM N/A 11/05/2013   Procedure: LEFT AND RIGHT HEART CATHETERIZATION WITH CORONARY ANGIOGRAM;  Surgeon: Peter M  Martinique, MD;  Location: [facility] CATH LAB;  Service: Cardiovascular;  Laterality: N/A;  . RIGHT HEART CATHETERIZATION N/A 01/14/2014   Procedure: RIGHT HEART CATH;  Surgeon: Larey Dresser, MD;  Location: [facility] CATH LAB;  Service: Cardiovascular;  Laterality: N/A;    There were no vitals filed for this visit.  Subjective Assessment - 04/21/19 0932    Subjective  Pt reports that he is doing well. Just finished with OT.    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 8/31-9/3 then inpatient rehab 9/3-9/23.PMH: CHF, HTN, hyperlipidemia, glomerulonephritis, pancytopenia/chronic anemia, seizure left-sided post stroke    Patient Stated Goals  Pt wants to be able to walk again.    Currently in Pain?  No/denies                       [facility] Adult PT Treatment/Exercise - 04/21/19 0933      Ambulation/Gait   Ambulation/Gait  Yes    Ambulation/Gait Assistance  4: Min guard;4: Min assist    Ambulation/Gait Assistance Details  Verbal cues to increase left foot clearance    Ambulation Distance (Feet)  350 Feet    Assistive device  Straight cane    Gait Pattern  Step-through pattern;Decreased step length - right;Decreased step length - left;Decreased hip/knee flexion - left    Ambulation Surface  Level;Indoor  Gait Comments  Pt performed gait with head turns left/right 100' at end with min assist for safety.      Neuro Re-ed    Neuro Re-ed Details   Standing on rockerboard positioned ant/post x 30 sec eyes open then attempted eyes closed with UE support at times CGA, rocking board ant/post x 10, rockerboard lateral maintaining level eyes open then eyes closed with LOB to right. . BP=158/88 after. In // bars: marching walk with 1 UE support x  2 laps, stepping over 2"x4" foam x 10 forward and x 10 lateral with each LE. Walking in // bars with 1 UE support large steps forwards x 3 laps, side stepping with light  bilateral UE support x 2 laps, forwards then backwards x 3 laps with 1 UE support and cues to try to increase left step back.      Exercises   Exercises  Other Exercises    Other Exercises   Seated left ankle DF AAROM with belt assisting  x 10 as starting to get some return.             PT Education - 04/21/19 1215    Education Details  Pt to continue with curent HEP    Person(s) Educated  Patient    Methods  Explanation    Comprehension  Verbalized understanding       PT Short Term Goals - 04/02/19 1218      PT SHORT TERM GOAL #1   Title  Pt will be independent with progressive HEP for strength and balance to continue gains on own.    Time  4    Period  Weeks    Status  New    Target Date  05/02/19      PT SHORT TERM GOAL #2   Title  Pt will increase gait speed from 0.50ms to >0.824m with SPC for improved gait safety.    Time  4    Period  Weeks    Status  New    Target Date  05/02/19      PT SHORT TERM GOAL #3   Title  Pt will increase Berg Balance from 41/56 to >45/56 for improved balance and decreased fall risk.    Time  4    Period  Weeks    Status  New    Target Date  05/02/19      PT SHORT TERM GOAL #4   Title  Pt will ambulate >400' with SPC on level surfaces supervision for improved mobility.    Time  4    Period  Weeks    Status  New    Target Date  05/02/19      PT SHORT TERM GOAL #5   Title  Pt will be able to perform sit to stand without UE support from standard chair consistently for improved functional strength.    Time  4    Period  Weeks    Status  New    Target Date  05/02/19        PT Long Term Goals - 04/02/19 1221      PT LONG TERM GOAL #1   Title  Pt will ambulate > 500' on varied surfaces with SPC mod I for improved community mobility.    Time  8    Period  Weeks    Status  New    Target Date  06/01/19      PT LONG TERM GOAL #2   Title  Pt will increase Berg from 41/56 to > 48/56 for improved balance and decreased fall  risk.    Time  8    Period  Weeks    Status  New    Target Date  06/01/19      PT LONG TERM GOAL #3   Title  Pt will increase 30 sec sit to stand from 7 reps from standard chair with arms to 10 or more for improved functional strength.    Time  8    Period  Weeks    Status  New    Target Date  06/01/19      PT LONG TERM GOAL #4   Title  Pt will decrease TUG from 16 sec with SPC to <14 sec for improved balance and functional mobility.    Time  8    Period  Weeks    Status  New    Target Date  06/01/19            Plan - 04/21/19 1216    Clinical Impression Statement  Pt required more frequent rest breaks today. Pt was challenged when vision removed with balance activities.    Personal Factors and Comorbidities  Comorbidity 3+    Comorbidities  CHF, HTN, hyperlipidemia, glomerulonephritis, pancytopenia/chronic anemia, seizure    Examination-Activity Limitations  Stairs;Stand;Squat;Locomotion Level;Transfers;Bed Mobility    Examination-Participation Restrictions  Community Activity;Shop;Driving;Cleaning;Yard Work    Merchant navy officer  Evolving/Moderate complexity    Rehab Potential  Good    PT Frequency  2x / week    PT Duration  8 weeks    PT Treatment/Interventions  DME Instruction;ADLs/Self Care Home Management;Gait training;Stair training;Functional mobility training;Therapeutic activities;Therapeutic exercise;Balance training;Orthotic Fit/Training;Patient/family education;Neuromuscular re-education;Wheelchair mobility training;Passive range of motion;Manual techniques    PT Next Visit Plan  Monitor BP with treatment. Continue gait with SPC adding in dynamic gait activities (head turns, obstacles). Standing balance and strengthening to left LE. Starting to get some left DF activation.    PT Home Exercise Plan  Access Code: [email or web address]    Consulted and Agree with Plan of Care  Patient    Family Member Consulted  wife       Patient will benefit from skilled  therapeutic intervention in order to improve the following deficits and impairments:  Abnormal gait, Difficulty walking, Decreased balance, Decreased mobility, Decreased strength, Decreased knowledge of use of DME, Decreased endurance, Decreased activity tolerance, Decreased range of motion, Pain, Increased muscle spasms  Visit Diagnosis: Hemiplegia and hemiparesis following cerebral infarction affecting left non-dominant side (HCC)  Muscle weakness (generalized)  Other abnormalities of gait and mobility     Problem List Patient Active Problem List   Diagnosis Date Noted  . 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 Acute blood loss anemia   . Steroid-induced hyperglycemia   . Acute on chronic anemia   . Seizures ([place])   . Spastic hemiparesis ([place])   . 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 Hypertension   . Thrombocytopenia ([place])   . Acute unilateral cerebral infarction in a watershed distribution [facility]) 01/15/2019  . Weakness   . CVA (cerebral vascular accident) ([place]) 01/12/2019  . Malnutrition of moderate degree 07/31/2018  . Elevated serum protein level   . Multiple myeloma ([place])   . Pancytopenia ([place])   . Normocytic normochromic anemia 07/16/2018  . Acute kidney failure ([place]) 07/16/2018  . NICM (nonischemic cardiomyopathy) ([place]) 11/12/2013  . History of ETOH abuse 11/12/2013  . Chronic combined systolic and diastolic CHF (congestive heart failure) ([place]) 11/12/2013  .  Congestive dilated cardiomyopathy ([place]) 11/04/2013  . Malignant hypertension     Electa Sniff, PT, DPT, NCS 04/21/2019, 12:18 PM  [facility] [address] [place], [place], [address] Phone: [phone]   Fax:  [phone]  Name: Jeremy Grant MRN: [id number] Date of Birth: Feb 21, 1961

## 2019-04-24 ENCOUNTER — Encounter: Payer: Self-pay | Admitting: Occupational Therapy

## 2019-04-24 ENCOUNTER — Encounter: Payer: Self-pay | Admitting: Physical Therapy

## 2019-04-24 ENCOUNTER — Other Ambulatory Visit: Payer: Self-pay

## 2019-04-24 ENCOUNTER — Ambulatory Visit: Payer: BC Managed Care – PPO | Admitting: Physical Therapy

## 2019-04-24 ENCOUNTER — Ambulatory Visit: Payer: BC Managed Care – PPO | Admitting: Occupational Therapy

## 2019-04-24 DIAGNOSIS — M6281 Muscle weakness (generalized): Secondary | ICD-10-CM

## 2019-04-24 DIAGNOSIS — R2689 Other abnormalities of gait and mobility: Secondary | ICD-10-CM

## 2019-04-24 DIAGNOSIS — I69354 Hemiplegia and hemiparesis following cerebral infarction affecting left non-dominant side: Secondary | ICD-10-CM

## 2019-04-24 DIAGNOSIS — R2681 Unsteadiness on feet: Secondary | ICD-10-CM

## 2019-04-24 NOTE — Therapy (Signed)
[facility] [address] [place], [place], [address] Phone: [phone]   Fax:  [phone]  Occupational Therapy Treatment  Patient Details  Name: Jeremy Grant MRN: [id number] Date of Birth: 12-23-1960 No data recorded  Encounter Date: 04/24/2019  OT End of Session - 04/24/19 1146    Visit Number  5    Number of Visits  13    Date for OT Re-Evaluation  05/24/19    Authorization Type  BC/BS--[place] deductible and oop met in full, prior auth required    OT Start Time  1111    OT Stop Time  1149    OT Time Calculation (min)  38 min    Activity Tolerance  Patient tolerated treatment well    Behavior During Therapy  [facility] for tasks assessed/performed       Past Medical History:  Diagnosis Date  . Arthritis   . CHF (congestive heart failure) ([place])   . Combined systolic and diastolic cardiac dysfunction    Echo 11/03/2013 EF 17%, grade 3 diastolic dysfunction  . Hypertension   . NICM (nonischemic cardiomyopathy) ([place])    a. L/RHC (11/05/13): RA: 3, RV 52/5, PA 49/19 (31), PCWP 10, AO 166/93, PA 67%, Fick CO/CI: 5.71/2.97, Lmain: normal, LAD: large, without signficant dz, first diagonal has 20% dz at ostium, LCx: normal, RCA: 30% stenosis at the bifurcation of PDA and PLOM    Past Surgical History:  Procedure Laterality Date  . BIOPSY  01/31/2019   Procedure: BIOPSY;  Surgeon: Otis Brace, MD;  Location: [facility] ENDOSCOPY;  Service: Gastroenterology;;  . ESOPHAGOGASTRODUODENOSCOPY N/A 01/31/2019   Procedure: ESOPHAGOGASTRODUODENOSCOPY (EGD);  Surgeon: Otis Brace, MD;  Location: [facility] ENDOSCOPY;  Service: Gastroenterology;  Laterality: N/A;  . IR FLUORO GUIDE CV LINE RIGHT  07/25/2018  . IR US GUIDE VASC ACCESS RIGHT  07/25/2018  . LEFT AND RIGHT HEART CATHETERIZATION WITH CORONARY ANGIOGRAM N/A 11/05/2013   Procedure: LEFT AND RIGHT HEART CATHETERIZATION WITH CORONARY ANGIOGRAM;  Surgeon: Peter M Martinique, MD;   Location: [facility] CATH LAB;  Service: Cardiovascular;  Laterality: N/A;  . RIGHT HEART CATHETERIZATION N/A 01/14/2014   Procedure: RIGHT HEART CATH;  Surgeon: Larey Dresser, MD;  Location: [facility] CATH LAB;  Service: Cardiovascular;  Laterality: N/A;    There were no vitals filed for this visit.  Subjective Assessment - 04/24/19 1111    Subjective   Pt/wife report that pt has been practicing moving at kitchen counter and in bathroom without walk as instructed last session and that it is going well    Patient is accompanied by:  Family member   wife   Pertinent History  watershed infarcts 01/12/19 including corpus collosum, seizures, chronic anemia. PMH: acute kidney failure March 2020, Shingles in May 2020, CHF, HTN, CKD(IV), HLD, auto immune disease    Limitations  seizures (on meds now - has not had any since d/c from hospital)    Currently in Pain?  No/denies         Sitting, picking up blocks using sustained grip strength with gripper on level 2 with min difficulty/drops and incorporation LUE reaching in ER/abduction for improved wt. Shift/posture and shoulder strength.  Arm bike x5 min level 1 for reciprocal movement/conditioning without rest, forward/backwards   Sitting, wt. Bearing through L hand with on arm body movements for incr wt. Shift to L and incr scapular stability/LUE strength with min cueing for improved positioning.  In standing, functional reaching  to mid-high range with LUE to  place small pegs in pegboard to copy design with good accuracy and for incr balance/activity tolerance for IADLs.  Sitting, closed-chain shoulder flex, diagonals with BUEs with min facilitation/cueing for positioning and incr stretch and to facilitate wt. Shift/trunk rotation.   Modified quadruped with forward/backward wt. Shift for incr scapula stability and posture with min facilitation for L knee control and positioning.      OT Short Term Goals - 04/21/19 0947      OT SHORT TERM GOAL #1    Title  Independent with LUE HEP    Time  3    Period  Weeks    Status  Achieved      OT SHORT TERM GOAL #2   Title  Grip strength Lt hand to be 60 lbs or greater    Baseline  Lt = 58 lbs (Rt = 64 lbs)    Time  3    Period  Weeks    Status  On-going      OT SHORT TERM GOAL #3   Title  Pt to stand to fold clothes and wash dishes x 10 min w/o rest    Time  3    Period  Weeks    Status  On-going      OT SHORT TERM GOAL #4   Title  Pt to consistently go into kitchen and make simple snack/sandwich at distant sup level    Time  3    Period  Weeks    Status  On-going        OT Long Term Goals - 03/24/19 1452      OT LONG TERM GOAL #1   Title  Independent with updated HEP    Time  6    Period  Weeks    Status  New      OT LONG TERM GOAL #2   Title  Pt able to place 3 lb object on high level shelf LUE 5/5 trials w/ min compensations and no pain    Time  6    Period  Weeks    Status  New      OT LONG TERM GOAL #3   Title  Pt to make simple breakfast mod I level safely using DME prn and fall prevention techniques    Time  6    Period  Weeks    Status  New            Plan - 04/24/19 1524    Clinical Impression Statement  Pt is progressing towards goals with improving balance and activity tolerance.    Occupational performance deficits (Please refer to evaluation for details):  IADL's;Leisure;Social Participation;Work    Marketing executive / Function / Physical Skills  ADL;IADL;Endurance;Body mechanics;Mobility;Strength;Coordination;UE functional use;Pain    Rehab Potential  Good    Comorbidities impacting occupational performance description:  chronic anemia, recent seizures, auto immune dz    OT Frequency  2x / week    OT Duration  6 weeks    OT Treatment/Interventions  Self-care/ADL training;Therapeutic exercise;Functional Mobility Training;Neuromuscular education;Energy conservation;Therapeutic activities;Coping strategies training;DME and/or AE instruction;Passive  range of motion;Patient/family education;Moist Heat    Plan  standing balance, LUE functional reach, theraband HEP    Consulted and Agree with Plan of Care  Patient       Patient will benefit from skilled therapeutic intervention in order to improve the following deficits and impairments:   Body Structure / Function / Physical Skills: ADL, IADL, Endurance, Body mechanics, Mobility, Strength, Coordination,  UE functional use, Pain       Visit Diagnosis: Hemiplegia and hemiparesis following cerebral infarction affecting left non-dominant side (HCC)  Unsteadiness on feet  Muscle weakness (generalized)  Other abnormalities of gait and mobility    Problem List Patient Active Problem List   Diagnosis Date Noted  . 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 Acute blood loss anemia   . Steroid-induced hyperglycemia   . Acute on chronic anemia   . Seizures ([place])   . Spastic hemiparesis ([place])   . 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 Hypertension   . Thrombocytopenia ([place])   . Acute unilateral cerebral infarction in a watershed distribution [facility]) 01/15/2019  . Weakness   . CVA (cerebral vascular accident) ([place]) 01/12/2019  . Malnutrition of moderate degree 07/31/2018  . Elevated serum protein level   . Multiple myeloma ([place])   . Pancytopenia ([place])   . Normocytic normochromic anemia 07/16/2018  . Acute kidney failure ([place]) 07/16/2018  . NICM (nonischemic cardiomyopathy) ([place]) 11/12/2013  . History of ETOH abuse 11/12/2013  . Chronic combined systolic and diastolic CHF (congestive heart failure) ([place]) 11/12/2013  . Congestive dilated cardiomyopathy ([place]) 11/04/2013  . Malignant hypertension     [facility] 04/24/2019, 3:24 PM  [facility] [address] [place], [place], [address] Phone: [phone]   Fax:  [phone]  Name: Jeremy Grant MRN: [id number] Date of Birth: 02/12/1961   Vianne Bulls, OTR/L [facility] [address] [place], [place]  [address] [phone] phone [phone] 04/24/19 3:28 PM

## 2019-04-25 NOTE — Therapy (Signed)
[facility] [address] [place], [place], [address] Phone: [phone]   Fax:  [phone]  Physical Therapy Treatment  Patient Details  Name: Jeremy Grant MRN: [id number] Date of Birth: 05-11-1961 Referring Provider (PT): Dr. Letta Pate   Encounter Date: 04/24/2019   04/24/19 1021  PT Visits / Re-Eval  Visit Number 18  Number of Visits 32  Date for PT Re-Evaluation 06/11/19  Authorization  Authorization Type BCBS  PT Time Calculation  PT Start Time 1017  PT Stop Time 1100  PT Time Calculation (min) 43 min  PT - End of Session  Equipment Utilized During Treatment Gait belt (left AFO)  Activity Tolerance Patient tolerated treatment well  Behavior During Therapy [facility] for tasks assessed/performed     Past Medical History:  Diagnosis Date  . Arthritis   . CHF (congestive heart failure) ([place])   . Combined systolic and diastolic cardiac dysfunction    Echo 11/03/2013 EF 84%, grade 3 diastolic dysfunction  . Hypertension   . NICM (nonischemic cardiomyopathy) ([place])    a. L/RHC (11/05/13): RA: 3, RV 52/5, PA 49/19 (31), PCWP 10, AO 166/93, PA 67%, Fick CO/CI: 5.71/2.97, Lmain: normal, LAD: large, without signficant dz, first diagonal has 20% dz at ostium, LCx: normal, RCA: 30% stenosis at the bifurcation of PDA and PLOM    Past Surgical History:  Procedure Laterality Date  . BIOPSY  01/31/2019   Procedure: BIOPSY;  Surgeon: Otis Brace, MD;  Location: [facility] ENDOSCOPY;  Service: Gastroenterology;;  . ESOPHAGOGASTRODUODENOSCOPY N/A 01/31/2019   Procedure: ESOPHAGOGASTRODUODENOSCOPY (EGD);  Surgeon: Otis Brace, MD;  Location: [facility] ENDOSCOPY;  Service: Gastroenterology;  Laterality: N/A;  . IR FLUORO GUIDE CV LINE RIGHT  07/25/2018  . IR US GUIDE VASC ACCESS RIGHT  07/25/2018  . LEFT AND RIGHT HEART CATHETERIZATION WITH CORONARY ANGIOGRAM N/A 11/05/2013   Procedure: LEFT AND RIGHT HEART CATHETERIZATION WITH  CORONARY ANGIOGRAM;  Surgeon: Peter M Martinique, MD;  Location: [facility] CATH LAB;  Service: Cardiovascular;  Laterality: N/A;  . RIGHT HEART CATHETERIZATION N/A 01/14/2014   Procedure: RIGHT HEART CATH;  Surgeon: Larey Dresser, MD;  Location: [facility] CATH LAB;  Service: Cardiovascular;  Laterality: N/A;    There were no vitals filed for this visit.     04/24/19 1018  Symptoms/Limitations  Subjective No new compliants. No falls or pain to report.  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 8/31-9/3 then inpatient rehab 9/3-9/23.PMH: CHF, HTN, hyperlipidemia, glomerulonephritis, pancytopenia/chronic anemia, seizure left-sided post stroke  Patient Stated Goals Pt wants to be able to walk again.  Pain Assessment  Currently in Pain? No/denies  Pain Score 0      04/24/19 1022  Transfers  Transfers Sit to Stand;Stand to Sit  Sit to Stand 6: Modified independent (Device/Increase time);With upper extremity assist;From chair/3-in-1;From bed  Stand to Sit 6: Modified independent (Device/Increase time);With upper extremity assist;To bed;To chair/3-in-1  Ambulation/Gait  Ambulation/Gait Yes  Ambulation/Gait Assistance 5: Supervision  Ambulation Distance (Feet)  (around gym with session)  Assistive device Rolling walker  Gait Pattern Step-through pattern;Decreased step length - right;Decreased step length - left;Decreased hip/knee flexion - left  Ambulation Surface Level;Indoor  Neuro Re-ed   Neuro Re-ed Details  for strengthening/muscle re-ed: tall kneeling on mat table with UE support on kaye bench- alternating UE raises with emphasis on staying tall, with bil UE support worked on lateral weight shifting while staying tall. with UE support- mini squats  for 10 reps, then without UE support minisquats for 10 reps. emphasis on equal LE weight bearing and return to tall position. with UE support- moving LE fwd/bwd for 10 reps  each side, then laterally (out/in) for 10 reps each side. Pt needed mod assist to get into tall kneeling and min assist to get out of tall kneeling.   Exercises  Exercises Other Exercises  Other Exercises  prone on mat table- left LE: hamstring curls, hip extension with knee extended, glut kick backs for 2 sets of 10 reps, assist needed for form/control;  seated at edge of mat- with red band- assisted DF/resisted PF for 10 reps with minimal movement noted,  without band ankle inversion/eversion for 10 reps each way with limited motions noted.          04/24/19 1052  Balance Exercises: Standing  Standing Eyes Closed Narrow base of support (BOS);Wide (BOA);Foam/compliant surface;Other reps (comment);30 secs;Limitations  Balance Exercises: Standing  Standing Eyes Closed Limitations on airex with no UE support: feet apart for EC for 1 rep, then with feet together for EC for 30 seconds for 3 reps. min guard assist to min assist for balance.          PT Short Term Goals - 04/02/19 1218      PT SHORT TERM GOAL #1   Title  Pt will be independent with progressive HEP for strength and balance to continue gains on own.    Time  4    Period  Weeks    Status  New    Target Date  05/02/19      PT SHORT TERM GOAL #2   Title  Pt will increase gait speed from 0.68ms to >0.845m with SPC for improved gait safety.    Time  4    Period  Weeks    Status  New    Target Date  05/02/19      PT SHORT TERM GOAL #3   Title  Pt will increase Berg Balance from 41/56 to >45/56 for improved balance and decreased fall risk.    Time  4    Period  Weeks    Status  New    Target Date  05/02/19      PT SHORT TERM GOAL #4   Title  Pt will ambulate >400' with SPC on level surfaces supervision for improved mobility.    Time  4    Period  Weeks    Status  New    Target Date  05/02/19      PT SHORT TERM GOAL #5   Title  Pt will be able to perform sit to stand without UE support from standard chair  consistently for improved functional strength.    Time  4    Period  Weeks    Status  New    Target Date  05/02/19        PT Long Term Goals - 04/02/19 1221      PT LONG TERM GOAL #1   Title  Pt will ambulate > 500' on varied surfaces with SPC mod I for improved community mobility.    Time  8    Period  Weeks    Status  New    Target Date  06/01/19      PT LONG TERM GOAL #2   Title  Pt will increase Berg from 41/56 to > 48/56 for improved balance and decreased fall risk.    Time  8  Period  Weeks    Status  New    Target Date  06/01/19      PT LONG TERM GOAL #3   Title  Pt will increase 30 sec sit to stand from 7 reps from standard chair with arms to 10 or more for improved functional strength.    Time  8    Period  Weeks    Status  New    Target Date  06/01/19      PT LONG TERM GOAL #4   Title  Pt will decrease TUG from 16 sec with SPC to <14 sec for improved balance and functional mobility.    Time  8    Period  Weeks    Status  New    Target Date  06/01/19         04/24/19 1022  Plan  Clinical Impression Statement Today's skilled session continued to focus on strengthening and balance with rest breaks taken due to fatigue. The pt is making steady progress toward goals and should benefit from continued PT to progress toward unmet goals.  Personal Factors and Comorbidities Comorbidity 3+  Comorbidities CHF, HTN, hyperlipidemia, glomerulonephritis, pancytopenia/chronic anemia, seizure  Examination-Activity Limitations Stairs;Stand;Squat;Locomotion Level;Transfers;Bed Mobility  Examination-Participation Restrictions Community Activity;Shop;Driving;Cleaning;Yard Work  Pt will benefit from skilled therapeutic intervention in order to improve on the following deficits Abnormal gait;Difficulty walking;Decreased balance;Decreased mobility;Decreased strength;Decreased knowledge of use of DME;Decreased endurance;Decreased activity tolerance;Decreased range of  motion;Pain;Increased muscle spasms  Stability/Clinical Decision Making Evolving/Moderate complexity  Rehab Potential Good  PT Frequency 2x / week  PT Duration 8 weeks  PT Treatment/Interventions DME Instruction;ADLs/Self Care Home Management;Gait training;Stair training;Functional mobility training;Therapeutic activities;Therapeutic exercise;Balance training;Orthotic Fit/Training;Patient/family education;Neuromuscular re-education;Wheelchair mobility training;Passive range of motion;Manual techniques  PT Next Visit Plan Monitor BP with treatment. Continue gait with SPC adding in dynamic gait activities (head turns, obstacles). Standing balance and strengthening to left LE. Starting to get some left DF activation.  PT Home Exercise Plan Access Code: [email or web address]  Consulted and Agree with Plan of Care Patient  Family Member Consulted wife          Patient will benefit from skilled therapeutic intervention in order to improve the following deficits and impairments:  Abnormal gait, Difficulty walking, Decreased balance, Decreased mobility, Decreased strength, Decreased knowledge of use of DME, Decreased endurance, Decreased activity tolerance, Decreased range of motion, Pain, Increased muscle spasms  Visit Diagnosis: Hemiplegia and hemiparesis following cerebral infarction affecting left non-dominant side (HCC)  Muscle weakness (generalized)  Other abnormalities of gait and mobility     Problem List Patient Active Problem List   Diagnosis Date Noted  . 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 Acute blood loss anemia   . Steroid-induced hyperglycemia   . Acute on chronic anemia   . Seizures ([place])   . Spastic hemiparesis ([place])   . 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 Hypertension   . Thrombocytopenia ([place])   . Acute unilateral cerebral infarction in a watershed distribution [facility]) 01/15/2019  . Weakness   . CVA (cerebral vascular accident) ([place]) 01/12/2019  . Malnutrition of moderate degree 07/31/2018   . Elevated serum protein level   . Multiple myeloma ([place])   . Pancytopenia ([place])   . Normocytic normochromic anemia 07/16/2018  . Acute kidney failure ([place]) 07/16/2018  . NICM (nonischemic cardiomyopathy) ([place]) 11/12/2013  . History of ETOH abuse 11/12/2013  . Chronic combined systolic and diastolic CHF (congestive heart failure) ([place]) 11/12/2013  . Congestive dilated cardiomyopathy ([place]) 11/04/2013  . Malignant hypertension  Willow Ora, PTA, [facility] [address] [place], [place] [address] [phone] 04/25/19, 9:13 PM   Name: Jeremy Grant MRN: [id number] Date of Birth: Oct 10, 1960

## 2019-04-27 ENCOUNTER — Ambulatory Visit (HOSPITAL_COMMUNITY)
Admission: RE | Admit: 2019-04-27 | Discharge: 2019-04-27 | Disposition: A | Discharge status: Home / Self Care | Payer: BC Managed Care – PPO | Source: Ambulatory Visit | Attending: Nephrology | Admitting: Nephrology

## 2019-04-27 VITALS — BP 172/80 | HR 67 | Temp 97.3°F | Resp 18

## 2019-04-27 DIAGNOSIS — N179 Acute kidney failure, unspecified: Secondary | ICD-10-CM

## 2019-04-27 LAB — POCT HEMOGLOBIN-HEMACUE: Hemoglobin: 9.7 g/dL — ABNORMAL LOW (ref 13.0–17.0)

## 2019-04-27 MED ORDER — EPOETIN ALFA-EPBX 10000 UNIT/ML IJ SOLN
INTRAMUSCULAR | Status: AC
  Administered 2019-04-27: 20000 [IU] via SUBCUTANEOUS
  Filled 2019-04-27: qty 2

## 2019-04-27 MED ORDER — EPOETIN ALFA-EPBX 10000 UNIT/ML IJ SOLN: 20000.0000 [IU] | INTRAMUSCULAR | Status: DC

## 2019-04-28 ENCOUNTER — Ambulatory Visit: Payer: BC Managed Care – PPO | Admitting: Occupational Therapy

## 2019-04-28 ENCOUNTER — Ambulatory Visit: Payer: BC Managed Care – PPO

## 2019-04-28 ENCOUNTER — Encounter: Payer: Self-pay | Admitting: Occupational Therapy

## 2019-04-28 ENCOUNTER — Other Ambulatory Visit: Payer: Self-pay

## 2019-04-28 DIAGNOSIS — R2689 Other abnormalities of gait and mobility: Secondary | ICD-10-CM

## 2019-04-28 DIAGNOSIS — I69354 Hemiplegia and hemiparesis following cerebral infarction affecting left non-dominant side: Secondary | ICD-10-CM

## 2019-04-28 DIAGNOSIS — R2681 Unsteadiness on feet: Secondary | ICD-10-CM

## 2019-04-28 DIAGNOSIS — M6281 Muscle weakness (generalized): Secondary | ICD-10-CM

## 2019-04-28 NOTE — Therapy (Signed)
[facility] [address] [place], [place], [address] Phone: [phone]   Fax:  [phone]  Physical Therapy Treatment  Patient Details  Name: Jeremy Grant MRN: [id number] Date of Birth: [birth year] Referring Provider (PT): Dr. Letta Pate   Encounter Date: 04/28/2019  PT End of Session - 04/28/19 0931    Visit Number  19    Number of Visits  32    Date for PT Re-Evaluation  06/11/19    Authorization Type  BCBS    PT Start Time  0932    PT Stop Time  1013    PT Time Calculation (min)  41 min    Equipment Utilized During Treatment  Gait belt   left AFO   Activity Tolerance  Patient tolerated treatment well    Behavior During Therapy  [facility] for tasks assessed/performed       Past Medical History:  Diagnosis Date  . Arthritis   . CHF (congestive heart failure) ([place])   . Combined systolic and diastolic cardiac dysfunction    Echo 11/03/2013 EF 69%, grade 3 diastolic dysfunction  . Hypertension   . NICM (nonischemic cardiomyopathy) ([place])    a. L/RHC (11/05/13): RA: 3, RV 52/5, PA 49/19 (31), PCWP 10, AO 166/93, PA 67%, Fick CO/CI: 5.71/2.97, Lmain: normal, LAD: large, without signficant dz, first diagonal has 20% dz at ostium, LCx: normal, RCA: 30% stenosis at the bifurcation of PDA and PLOM    Past Surgical History:  Procedure Laterality Date  . BIOPSY  01/31/2019   Procedure: BIOPSY;  Surgeon: Otis Brace, MD;  Location: [facility] ENDOSCOPY;  Service: Gastroenterology;;  . ESOPHAGOGASTRODUODENOSCOPY N/A 01/31/2019   Procedure: ESOPHAGOGASTRODUODENOSCOPY (EGD);  Surgeon: Otis Brace, MD;  Location: [facility] ENDOSCOPY;  Service: Gastroenterology;  Laterality: N/A;  . IR FLUORO GUIDE CV LINE RIGHT  07/25/2018  . IR US GUIDE VASC ACCESS RIGHT  07/25/2018  . LEFT AND RIGHT HEART CATHETERIZATION WITH CORONARY ANGIOGRAM N/A 11/05/2013   Procedure: LEFT AND RIGHT HEART CATHETERIZATION WITH CORONARY ANGIOGRAM;  Surgeon: Peter  M Martinique, MD;  Location: [facility] CATH LAB;  Service: Cardiovascular;  Laterality: N/A;  . RIGHT HEART CATHETERIZATION N/A 01/14/2014   Procedure: RIGHT HEART CATH;  Surgeon: Larey Dresser, MD;  Location: [facility] CATH LAB;  Service: Cardiovascular;  Laterality: N/A;    There were no vitals filed for this visit.  Subjective Assessment - 04/28/19 0932    Subjective  Pt just finished with OT. Denies any new concerns. Pt grilled steaks this weekend.    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 8/31-9/3 then inpatient rehab 9/3-9/23.PMH: CHF, HTN, hyperlipidemia, glomerulonephritis, pancytopenia/chronic anemia, seizure left-sided post stroke    Patient Stated Goals  Pt wants to be able to walk again.    Currently in Pain?  No/denies                       [facility] Adult PT Treatment/Exercise - 04/28/19 0934      Transfers   Transfers  Sit to Stand;Stand to Sit    Sit to Stand  6: Modified independent (Device/Increase time)    Stand to Sit  6: Modified independent (Device/Increase time)    Comments  Sit to stand from low mat x 3 with 6" step under right foot with 1 UE support then with 4" step under right foot x 3 with mod assist PT pushing forward without UE support,  then x 5 with nothing under feet min assist PT to push forward. Pt was instructed to lean forward and reach to help.      Ambulation/Gait   Ambulation/Gait  Yes    Ambulation/Gait Assistance  5: Supervision;4: Min guard    Ambulation/Gait Assistance Details  Verbal cues to increase step length.    Ambulation Distance (Feet)  650 Feet    Assistive device  Straight cane    Gait Pattern  Step-through pattern;Decreased hip/knee flexion - left    Ambulation Surface  Level;Indoor    Gait velocity  17.4 sec comfortable=0.60ms and fast 13.4 sec=0.7198m    Ramp  5: Supervision    Ramp Details (indicate cue type and reason)  CGA with verbal cues  for increased left foot clearance. Performed x 2 with SPC.    Curb  4: Min assist    Curb Details (indicate cue type and reason)  Verbal cues for sequencing. Performed x 3 reps with initial decreased left toe clearance on step.      Neuro Re-ed    Neuro Re-ed Details   Standing on airex with feet apart: eyes open x 30 sec and eyes closed x 30 sec with increased sway, head turns left/right x 10, reaching up and across body to give therapist high five x 10 each side. Standing on airex feet together x 30 sec with verbal and tactile cuing to increase left weight shift.              PT Education - 04/28/19 1218    Education Details  Pt to continue with current HEP. In[facility]  Person[facility] Educated  Patient    Methods  Explanation    Comprehension  Verbalized understanding       PT Short Term Goals - 04/28/19 0935      PT SHORT TERM GOAL #1   Title  Pt will be independent with progressive HEP for strength and balance to continue gains on own.    Baseline  Pt reports that he has been working on exercises as instructed.    Time  4    Period  Weeks    Status  Achieved    Target Date  05/02/19      PT SHORT TERM GOAL #2   Title  Pt will increase gait speed from 0.73m31mto >0.98m/873mith SPC for improved gait safety.    Baseline  0.12m/44mmfortable and 0.10m/s52mt with SPC on 04/28/2019    Time  4    Period  Weeks    Status  Partially Met    Target Date  05/02/19      PT SHORT TERM GOAL #3   Title  Pt will increase Berg Balance from 41/56 to >45/56 for improved balance and decreased fall risk.    Time  4    Period  Weeks    Status  New    Target Date  05/02/19      PT SHORT TERM GOAL #4   Title  Pt will ambulate >400' with SPC on level surfaces supervision for improved mobility.    Time  4    Period  Weeks    Status  New    Target Date  05/02/19      PT SHORT TERM GOAL #5   Title  Pt will be able to perform sit to stand without UE support from standard chair  consistently for improved functional strength.    Time  4    Period  Weeks    Status  New    Target Date  05/02/19        PT Long Term Goals - 04/02/19 1221      PT LONG TERM GOAL #1   Title  Pt will ambulate > 500' on varied surfaces with SPC mod I for improved community mobility.    Time  8    Period  Weeks    Status  New    Target Date  06/01/19      PT LONG TERM GOAL #2   Title  Pt will increase Berg from 41/56 to > 48/56 for improved balance and decreased fall risk.    Time  8    Period  Weeks    Status  New    Target Date  06/01/19      PT LONG TERM GOAL #3   Title  Pt will increase 30 sec sit to stand from 7 reps from standard chair with arms to 10 or more for improved functional strength.    Time  8    Period  Weeks    Status  New    Target Date  06/01/19      PT LONG TERM GOAL #4   Title  Pt will decrease TUG from 16 sec with SPC to <14 sec for improved balance and functional mobility.    Time  8    Period  Weeks    Status  New    Target Date  06/01/19            Plan - 04/28/19 1219    Clinical Impression Statement  Pt making steady progress towards goals. Increased gait distance with SPC today at supervision level on level surfaces. Gait speed increased just short of STG. Pt is still walking at slower speed for safe community ambulator. Will benefit from continued PT to continue to progress gait, balance and strength.    Personal Factors and Comorbidities  Comorbidity 3+    Comorbidities  CHF, HTN, hyperlipidemia, glomerulonephritis, pancytopenia/chronic anemia, seizure    Examination-Activity Limitations  Stairs;Stand;Squat;Locomotion Level;Transfers;Bed Mobility    Examination-Participation Restrictions  Community Activity;Shop;Driving;Cleaning;Yard Work    [employer]  Evolving/Moderate complexity    Rehab Potential  Good    PT Frequency  2x / week    PT Duration  8 weeks    PT Treatment/Interventions  DME  Instruction;ADLs/Self Care Home Management;Gait training;Stair training;Functional mobility training;Therapeutic activities;Therapeutic exercise;Balance training;Orthotic Fit/Training;Patient/family education;Neuromuscular re-education;Wheelchair mobility training;Passive range of motion;Manual techniques    PT Next Visit Plan  Monitor BP with treatment. Reassess remaining STG next visit including Berg. Continue gait with SPC adding in dynamic gait activities (head turns, obstacles). Standing balance and strengthening to left LE. Starting to get some left DF activation.    PT Home Exercise Plan  Access Code: [email or web address]    Consulted and Agree with Plan of Care  Patient    Family Member Consulted  wife       Patient will benefit from skilled therapeutic intervention in order to improve the following deficits and impairments:  Abnormal gait, Difficulty walking, Decreased balance, Decreased mobility, Decreased strength, Decreased knowledge of use of DME, Decreased endurance, Decreased activity tolerance, Decreased range of motion, Pain, Increased muscle spasms  Visit Diagnosis: Muscle weakness (generalized)  Other abnormalities of gait and mobility     Problem List Patient Active Problem List   Diagnosis Date Noted  . 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 Acute blood  loss anemia   . Steroid-induced hyperglycemia   . Acute on chronic anemia   . Seizures ([place])   . Spastic hemiparesis ([place])   . 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 Hypertension   . Thrombocytopenia ([place])   . Acute unilateral cerebral infarction in a watershed distribution [facility]) 01/15/2019  . Weakness   . CVA (cerebral vascular accident) ([place]) 01/12/2019  . Malnutrition of moderate degree 07/31/2018  . Elevated serum protein level   . Multiple myeloma ([place])   . Pancytopenia ([place])   . Normocytic normochromic anemia 07/16/2018  . Acute kidney failure ([place]) 07/16/2018  . NICM (nonischemic cardiomyopathy) ([place]) 11/12/2013  . History of ETOH  abuse 11/12/2013  . Chronic combined systolic and diastolic CHF (congestive heart failure) ([place]) 11/12/2013  . Congestive dilated cardiomyopathy ([place]) 11/04/2013  . Malignant hypertension     Electa Sniff, PT, DPT, NCS 04/28/2019, 12:22 PM  [facility] [address] [place], [place], [address] Phone: [phone]   Fax:  [phone]  Name: Jeremy Grant MRN: [id number] Date of Birth: [birth year]

## 2019-04-28 NOTE — Patient Instructions (Signed)
    Strengthening: Resisted Flexion   Attach tube to door.  Hold tubing with one arm at side. Pull forward and up with elbow straight. Move shoulder through pain-free range of motion, no further than shoulder height. Repeat 10 times per set.  Do 1 sessions per day.    Strengthening: Resisted Extension   Attach one end to door.  Hold tubing in one hand, arm forward. Pull arm back, elbow straight. Repeat 10 times per set. Do 1 sessions per day.  Scapular Retraction: Rowing (Eccentric) - Arms - Side (Resistance Band)    Hold end of band in each hand. Pull back until elbows are even with trunk. Keep elbows by sides, thumbs up. Slowly release for 3-5 seconds.  10 reps per set,1 time per day     Resisted Horizontal Abduction: Bilateral   Sit , tubing in both hands, palms down and arms out in front at belly button level. Keeping arms straight, pinch shoulder blades together and stretch arms out. Repeat 10 times per set.  Do 1 sessions per day.       Triceps Extension (Frontal)    One arm forward at chest height and bent to 90, end of band in hand, other end secured on same side shoulder by other hand, extend arm down slowly. Hold 2seconds. Repeat 10times, alternating arms. Do 1 sessions per day.

## 2019-04-28 NOTE — Therapy (Signed)
[facility] [address] [place], [place], [address] Phone: [phone]   Fax:  [phone]  Occupational Therapy Treatment  Patient Details  Name: Jeremy Grant MRN: [id number] Date of Birth: 10-04-1960 No data recorded  Encounter Date: 04/28/2019  OT End of Session - 04/28/19 0917    Visit Number  6    Number of Visits  13    Date for OT Re-Evaluation  05/24/19    Authorization Type  BC/BS--[place] deductible and oop met in full, prior auth required    OT Start Time  0852    OT Stop Time  0931    OT Time Calculation (min)  39 min    Activity Tolerance  Patient tolerated treatment well    Behavior During Therapy  [facility] for tasks assessed/performed       Past Medical History:  Diagnosis Date  . Arthritis   . CHF (congestive heart failure) ([place])   . Combined systolic and diastolic cardiac dysfunction    Echo 11/03/2013 EF 69%, grade 3 diastolic dysfunction  . Hypertension   . NICM (nonischemic cardiomyopathy) ([place])    a. L/RHC (11/05/13): RA: 3, RV 52/5, PA 49/19 (31), PCWP 10, AO 166/93, PA 67%, Fick CO/CI: 5.71/2.97, Lmain: normal, LAD: large, without signficant dz, first diagonal has 20% dz at ostium, LCx: normal, RCA: 30% stenosis at the bifurcation of PDA and PLOM    Past Surgical History:  Procedure Laterality Date  . BIOPSY  01/31/2019   Procedure: BIOPSY;  Surgeon: Otis Brace, MD;  Location: [facility] ENDOSCOPY;  Service: Gastroenterology;;  . ESOPHAGOGASTRODUODENOSCOPY N/A 01/31/2019   Procedure: ESOPHAGOGASTRODUODENOSCOPY (EGD);  Surgeon: Otis Brace, MD;  Location: [facility] ENDOSCOPY;  Service: Gastroenterology;  Laterality: N/A;  . IR FLUORO GUIDE CV LINE RIGHT  07/25/2018  . IR US GUIDE VASC ACCESS RIGHT  07/25/2018  . LEFT AND RIGHT HEART CATHETERIZATION WITH CORONARY ANGIOGRAM N/A 11/05/2013   Procedure: LEFT AND RIGHT HEART CATHETERIZATION WITH CORONARY ANGIOGRAM;  Surgeon: Peter M Martinique, MD;   Location: [facility] CATH LAB;  Service: Cardiovascular;  Laterality: N/A;  . RIGHT HEART CATHETERIZATION N/A 01/14/2014   Procedure: RIGHT HEART CATH;  Surgeon: Larey Dresser, MD;  Location: [facility] CATH LAB;  Service: Cardiovascular;  Laterality: N/A;    There were no vitals filed for this visit.      Arm bike x5 min level 1 for reciprocal movement/conditioning without rest, forward/backwards   In standing, functional reaching  to mid-high range with LUE to place/remove clothespins with 1-8lb resistance for incr balance/activity tolerance for IADLs, incorporating wt. Shift to the L and trunk rotation.  Sitting, closed-chain shoulder flex, diagonals with BUEs with min facilitation/cueing for positioning and incr stretch and to facilitate wt. Shift/trunk rotation.  Quadruped with forward/backward wt. And alternating UE lifts Shift for incr scapula stability and posture with min facilitation/cues for elbow ext and performance.       OT Education - 04/28/19 0916    Education Details  Yellow theraband HEP--see pt instructions    Person(s) Educated  Patient;Spouse    Methods  Explanation;Demonstration;Verbal cues;Handout;Tactile cues    Comprehension  Verbalized understanding;Returned demonstration;Verbal cues required   cueing for improved positioning      OT Short Term Goals - 04/21/19 0922      OT SHORT TERM GOAL #1   Title  Independent with LUE HEP    Time  3    Period  Weeks    Status  Achieved  OT SHORT TERM GOAL #2   Title  Grip strength Lt hand to be 60 lbs or greater    Baseline  Lt = 58 lbs (Rt = 64 lbs)    Time  3    Period  Weeks    Status  On-going      OT SHORT TERM GOAL #3   Title  Pt to stand to fold clothes and wash dishes x 10 min w/o rest    Time  3    Period  Weeks    Status  On-going      OT SHORT TERM GOAL #4   Title  Pt to consistently go into kitchen and make simple snack/sandwich at distant sup level    Time  3    Period  Weeks    Status   On-going        OT Long Term Goals - 03/24/19 1452      OT LONG TERM GOAL #1   Title  Independent with updated HEP    Time  6    Period  Weeks    Status  New      OT LONG TERM GOAL #2   Title  Pt able to place 3 lb object on high level shelf LUE 5/5 trials w/ min compensations and no pain    Time  6    Period  Weeks    Status  New      OT LONG TERM GOAL #3   Title  Pt to make simple breakfast mod I level safely using DME prn and fall prevention techniques    Time  6    Period  Weeks    Status  New            Plan - 04/28/19 0929    Clinical Impression Statement  Pt is continuing to progres towards goals with improving balance and activity tolerance and LUE strength.    Occupational performance deficits (Please refer to evaluation for details):  IADL's;Leisure;Social Participation;Work    Marketing executive / Function / Physical Skills  ADL;IADL;Endurance;Body mechanics;Mobility;Strength;Coordination;UE functional use;Pain    Rehab Potential  Good    Comorbidities impacting occupational performance description:  chronic anemia, recent seizures, auto immune dz    OT Frequency  2x / week    OT Duration  6 weeks    OT Treatment/Interventions  Self-care/ADL training;Therapeutic exercise;Functional Mobility Training;Neuromuscular education;Energy conservation;Therapeutic activities;Coping strategies training;DME and/or AE instruction;Passive range of motion;Patient/family education;Moist Heat    Plan  standing balance, LUE functional reach, review theraband HEP, begin checking STGs    Consulted and Agree with Plan of Care  Patient       Patient will benefit from skilled therapeutic intervention in order to improve the following deficits and impairments:   Body Structure / Function / Physical Skills: ADL, IADL, Endurance, Body mechanics, Mobility, Strength, Coordination, UE functional use, Pain       Visit Diagnosis: Hemiplegia and hemiparesis following cerebral infarction  affecting left non-dominant side (HCC)  Unsteadiness on feet  Muscle weakness (generalized)  Other abnormalities of gait and mobility    Problem List Patient Active Problem List   Diagnosis Date Noted  . 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 Acute blood loss anemia   . Steroid-induced hyperglycemia   . Acute on chronic anemia   . Seizures ([place])   . Spastic hemiparesis ([place])   . 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 Hypertension   . Thrombocytopenia ([place])   . Acute unilateral cerebral infarction  in a watershed distribution [facility]) 01/15/2019  . Weakness   . CVA (cerebral vascular accident) ([place]) 01/12/2019  . Malnutrition of moderate degree 07/31/2018  . Elevated serum protein level   . Multiple myeloma ([place])   . Pancytopenia ([place])   . Normocytic normochromic anemia 07/16/2018  . Acute kidney failure ([place]) 07/16/2018  . NICM (nonischemic cardiomyopathy) ([place]) 11/12/2013  . History of ETOH abuse 11/12/2013  . Chronic combined systolic and diastolic CHF (congestive heart failure) ([place]) 11/12/2013  . Congestive dilated cardiomyopathy ([place]) 11/04/2013  . Malignant hypertension     [facility] 04/28/2019, 12:20 PM  [place] [address] [place], [place], [address] Phone: [phone]   Fax:  [phone]  Name: Jeremy Grant MRN: [id number] Date of Birth: Oct 31, 1960   Vianne Bulls, OTR/L [facility] [address] [place], [place]  [address] [phone] phone [phone] 04/28/19 12:23 PM

## 2019-05-01 ENCOUNTER — Ambulatory Visit: Payer: BC Managed Care – PPO

## 2019-05-01 ENCOUNTER — Ambulatory Visit: Payer: BC Managed Care – PPO | Admitting: Occupational Therapy

## 2019-05-01 ENCOUNTER — Other Ambulatory Visit: Payer: Self-pay

## 2019-05-01 VITALS — BP 132/72

## 2019-05-01 DIAGNOSIS — M6281 Muscle weakness (generalized): Secondary | ICD-10-CM

## 2019-05-01 DIAGNOSIS — R2689 Other abnormalities of gait and mobility: Secondary | ICD-10-CM

## 2019-05-01 DIAGNOSIS — I69354 Hemiplegia and hemiparesis following cerebral infarction affecting left non-dominant side: Secondary | ICD-10-CM

## 2019-05-01 DIAGNOSIS — R2681 Unsteadiness on feet: Secondary | ICD-10-CM

## 2019-05-01 NOTE — Therapy (Signed)
[facility] [address] [place], [place], [address] Phone: [phone]   Fax:  [phone]  Occupational Therapy Treatment  Patient Details  Name: Jeremy Grant MRN: [id number] Date of Birth: 1960-12-06 No data recorded  Encounter Date: 05/01/2019  OT End of Session - 05/01/19 1825    Visit Number  7    Number of Visits  13    Date for OT Re-Evaluation  05/24/19    Authorization Type  BC/BS--[place] deductible and oop met in full, prior auth required    OT Start Time  1106    OT Stop Time  1145    OT Time Calculation (min)  39 min    Activity Tolerance  Patient tolerated treatment well    Behavior During Therapy  [facility] for tasks assessed/performed       Past Medical History:  Diagnosis Date  . Arthritis   . CHF (congestive heart failure) ([place])   . Combined systolic and diastolic cardiac dysfunction    Echo 11/03/2013 EF 77%, grade 3 diastolic dysfunction  . Hypertension   . NICM (nonischemic cardiomyopathy) ([place])    a. L/RHC (11/05/13): RA: 3, RV 52/5, PA 49/19 (31), PCWP 10, AO 166/93, PA 67%, Fick CO/CI: 5.71/2.97, Lmain: normal, LAD: large, without signficant dz, first diagonal has 20% dz at ostium, LCx: normal, RCA: 30% stenosis at the bifurcation of PDA and PLOM    Past Surgical History:  Procedure Laterality Date  . BIOPSY  01/31/2019   Procedure: BIOPSY;  Surgeon: Otis Brace, MD;  Location: [facility] ENDOSCOPY;  Service: Gastroenterology;;  . ESOPHAGOGASTRODUODENOSCOPY N/A 01/31/2019   Procedure: ESOPHAGOGASTRODUODENOSCOPY (EGD);  Surgeon: Otis Brace, MD;  Location: [facility] ENDOSCOPY;  Service: Gastroenterology;  Laterality: N/A;  . IR FLUORO GUIDE CV LINE RIGHT  07/25/2018  . IR US GUIDE VASC ACCESS RIGHT  07/25/2018  . LEFT AND RIGHT HEART CATHETERIZATION WITH CORONARY ANGIOGRAM N/A 11/05/2013   Procedure: LEFT AND RIGHT HEART CATHETERIZATION WITH CORONARY ANGIOGRAM;  Surgeon: Peter M Martinique, MD;   Location[facility] C[facility] LAB;  Service: Cardiovascular;  Laterality: N/A;  . RIGHT HEART CATHETERIZATION N/A 01/14/2014   Procedure: RIGHT HEART CATH;  Surgeon: Larey Dresser, MD;  Location: [facility] CATH LAB;  Service: Cardiovascular;  Laterality: N/A;    There were no vitals filed for this visit.    In standing, functional reaching  to mid-high range with LUE to place/remove small pegs in vertical pegboard for incr balance/activity tolerance for IADLs, incorporating wt. Shift to the L and trunk rotation (without UE support but walker available and close supervision).  Pt with LOB x1 when adjusting LLE placement without UE support with min A for recover.  Stood for approx 46mn.  Sitting, closed-chain shoulder flex, diagonals with BUEs with min facilitation/cueing for positioning and incr stretch and to facilitate wt. Shift/trunk rotation.  Quadruped with forward/backward wt. And alternating UE lifts Shift for incr scapula stability and posture with min facilitation/cues for elbow ext and alignment.  Tall kneeling, AAROM shoulder flex with ball on mat with CGA and min cueing.    In prone, scapular retraction with shoulders in ext with min cueing.  Wt. Bearing on elbows with head/chest lift for incr scapular depression/stabilization.    Began checking STGs.  Recommeneded that pt begin preparing breakfast with supervision for incr activity tolerance and IADL performance.  Picking up blocks with gripper set on level 2 ([place]) for sustained grip strength with min difficulty.  OT Short Term Goals - 05/01/19 1143      OT SHORT TERM GOAL #1   Title  Independent with LUE HEP    Time  3    Period  Weeks    Status  Achieved      OT SHORT TERM GOAL #2   Title  Grip strength Lt hand to be 60 lbs or greater    Baseline  Lt = 58 lbs (Rt = 64 lbs)    Time  3    Period  Weeks    Status  On-going   05/01/19:  50.9lbs     OT SHORT TERM GOAL #3   Title  Pt to stand to  fold clothes and wash dishes x 10 min w/o rest    Time  3    Period  Weeks    Status  On-going      OT SHORT TERM GOAL #4   Title  Pt to consistently go into kitchen and make simple snack/sandwich at distant sup level    Time  3    Period  Weeks    Status  Achieved   05/01/19       OT Long Term Goals - 03/24/19 1452      OT LONG TERM GOAL #1   Title  Independent with updated HEP    Time  6    Period  Weeks    Status  New      OT LONG TERM GOAL #2   Title  Pt able to place 3 lb object on high level shelf LUE 5/5 trials w/ min compensations and no pain    Time  6    Period  Weeks    Status  New      OT LONG TERM GOAL #3   Title  Pt to make simple breakfast mod I level safely using DME prn and fall prevention techniques    Time  6    Period  Weeks    Status  New            Plan - 05/01/19 1825    Clinical Impression Statement  Pt is continuing to progres towards goals with improving balance, activity tolerance, and LUE strength.  Pt continues to need min v.c. for LLE placement/incr wt. shift and needs UE support when lifting LLE.    Occupational performance deficits (Please refer to evaluation for details):  IADL's;Leisure;Social Participation;Work    Marketing executive / Function / Physical Skills  ADL;IADL;Endurance;Body mechanics;Mobility;Strength;Coordination;UE functional use;Pain    Rehab Potential  Good    Comorbidities impacting occupational performance description:  chronic anemia, recent seizures, auto immune dz    OT Frequency  2x / week    OT Duration  6 weeks    OT Treatment/Interventions  Self-care/ADL training;Therapeutic exercise;Functional Mobility Training;Neuromuscular education;Energy conservation;Therapeutic activities;Coping strategies training;DME and/or AE instruction;Passive range of motion;Patient/family education;Moist Heat    Plan  standing balance, LUE functional reach, review theraband HEP, check remaining STGs    Consulted and Agree with  Plan of Care  Patient       Patient will benefit from skilled therapeutic intervention in order to improve the following deficits and impairments:   Body Structure / Function / Physical Skills: ADL, IADL, Endurance, Body mechanics, Mobility, Strength, Coordination, UE functional use, Pain       Visit Diagnosis: Hemiplegia and hemiparesis following cerebral infarction affecting left non-dominant side (HCC)  Muscle weakness (generalized)  Other abnormalities of gait and mobility  Unsteadiness on feet    Problem List Patient Active Problem List   Diagnosis Date Noted  . 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 Acute blood loss anemia   . Steroid-induced hyperglycemia   . Acute on chronic anemia   . Seizures ([place])   . Spastic hemiparesis ([place])   . 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 Hypertension   . Thrombocytopenia ([place])   . Acute unilateral cerebral infarction in a watershed distribution [facility]) 01/15/2019  . Weakness   . CVA (cerebral vascular accident) ([place]) 01/12/2019  . Malnutrition of moderate degree 07/31/2018  . Elevated serum protein level   . Multiple myeloma ([place])   . Pancytopenia ([place])   . Normocytic normochromic anemia 07/16/2018  . Acute kidney failure ([place]) 07/16/2018  . NICM (nonischemic cardiomyopathy) ([place]) 11/12/2013  . History of ETOH abuse 11/12/2013  . Chronic combined systolic and diastolic CHF (congestive heart failure) ([place]) 11/12/2013  . Congestive dilated cardiomyopathy ([place]) 11/04/2013  . Malignant hypertension     [facility] 05/01/2019, 6:36 PM  [facility] [address] [place], [place], [address] Phone: [phone]   Fax:  [phone]  Name: Jeremy Grant MRN: [id number] Date of Birth: 03-Apr-1961   Vianne Bulls, OTR/L [facility] [address] [place], [place]  [address] [phone] phone [phone] 05/01/19 6:36 PM

## 2019-05-01 NOTE — Therapy (Signed)
[facility] [address] [place], [place], [address] Phone: [phone]   Fax:  [phone]  Physical Therapy Treatment  Patient Details  Name: Jeremy Grant MRN: [id number] Date of Birth: Jul 23, 1960 Referring Provider (PT): Dr. Letta Pate   Encounter Date: 05/01/2019  PT End of Session - 05/01/19 1026    Visit Number  20    Number of Visits  32    Date for PT Re-Evaluation  06/11/19    Authorization Type  BCBS    PT Start Time  1024   PT running behind   PT Stop Time  1059    PT Time Calculation (min)  35 min    Equipment Utilized During Treatment  Gait belt   left AFO   Activity Tolerance  Patient tolerated treatment well    Behavior During Therapy  [facility] for tasks assessed/performed       Past Medical History:  Diagnosis Date  . Arthritis   . CHF (congestive heart failure) ([place])   . Combined systolic and diastolic cardiac dysfunction    Echo 11/03/2013 EF 75%, grade 3 diastolic dysfunction  . Hypertension   . NICM (nonischemic cardiomyopathy) ([place])    a. L/RHC (11/05/13): RA: 3, RV 52/5, PA 49/19 (31), PCWP 10, AO 166/93, PA 67%, Fick CO/CI: 5.71/2.97, Lmain: normal, LAD: large, without signficant dz, first diagonal has 20% dz at ostium, LCx: normal, RCA: 30% stenosis at the bifurcation of PDA and PLOM    Past Surgical History:  Procedure Laterality Date  . BIOPSY  01/31/2019   Procedure: BIOPSY;  Surgeon: Otis Brace, MD;  Location: [facility];  Service: Gastroenterology;;  . ESOPHAGOGASTRODUODENOSCOPY N/A 01/31/2019   Procedure: ESOPHAGOGASTRODUODENOSCOPY (EGD);  Surgeon: Otis Brace, MD;  Location: [facility] ENDOSCOPY;  Service: Gastroenterology;  Laterality: N/A;  . IR FLUORO GUIDE CV LINE RIGHT  07/25/2018  . IR US GUIDE VASC ACCESS RIGHT  07/25/2018  . LEFT AND RIGHT HEART CATHETERIZATION WITH CORONARY ANGIOGRAM N/A 11/05/2013   Procedure: LEFT AND RIGHT HEART CATHETERIZATION WITH CORONARY  ANGIOGRAM;  Surgeon: Peter M Martinique, MD;  Location: [facility] CATH LAB;  Service: Cardiovascular;  Laterality: N/A;  . RIGHT HEART CATHETERIZATION N/A 01/14/2014   Procedure: RIGHT HEART CATH;  Surgeon: Larey Dresser, MD;  Location: [facility] CATH LAB;  Service: Cardiovascular;  Laterality: N/A;    Vitals:   05/01/19 1049  BP: 132/72    Subjective Assessment - 05/01/19 1027    Subjective  Pt reports that he did purchase cane and tried at home with wife. Kidney doctor doubled carvedilol.    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 8/31-9/3 then inpatient rehab 9/3-9/23.PMH: CHF, HTN, hyperlipidemia, glomerulonephritis, pancytopenia/chronic anemia, seizure left-sided post stroke    Patient Stated Goals  Pt wants to be able to walk again.    Currently in Pain?  No/denies                       [facility] Adult PT Treatment/Exercise - 05/01/19 1028      Transfers   Transfers  Sit to Stand;Stand to Sit    Sit to Stand  6: Modified independent (Device/Increase time)    Stand to Sit  6: Modified independent (Device/Increase time)      Ambulation/Gait   Ambulation/Gait  Yes    Ambulation/Gait Assistance  5: Supervision    Ambulation Distance (Feet)  500 Feet   300' at  end of session with [facility]   Assistive device  Straight cane    Gait Pattern  Step-through pattern    Ambulation Surface  Level;Indoor    Gait Comments  Dynamic gait activities in hallway with [place] 30' x 2: marching gait, gait with head turns left/right, gait with head turns up/down, large step gait. CGA for safety      Neuro Re-ed    Neuro Re-ed Details   In // bars: marching gait forwards and then backwards steps x 4 laps. Step downs off 4" step lowering with left x 6 with bilateral UE support, lowering down with left LE laterally x 8, Step-ups with left LE on Bosu with bilateral UE support 8 x 2 with PT providing tactile assist and verbal  cueing to shift over leg more.             PT Education - 05/01/19 1328    Education Details  Pt to continue with current HEP    Person(s) Educated  Patient    Methods  Explanation    Comprehension  Verbalized understanding       PT Short Term Goals - 05/01/19 1330      PT SHORT TERM GOAL #1   Title  Pt will be independent with progressive HEP for strength and balance to continue gains on own.    Baseline  Pt reports that he has been working on exercises as instructed.    Time  4    Period  Weeks    Status  Achieved    Target Date  05/02/19      PT SHORT TERM GOAL #2   Title  Pt will increase gait speed from 0.60ms to >0.849m with SPC for improved gait safety.    Baseline  0.5745mcomfortable and 0.60m90mast with SPC on 04/28/2019    Time  4    Period  Weeks    Status  Partially Met    Target Date  05/02/19      PT SHORT TERM GOAL #3   Title  Pt will increase Berg Balance from 41/56 to >45/56 for improved balance and decreased fall risk.    Time  4    Period  Weeks    Status  New    Target Date  05/02/19      PT SHORT TERM GOAL #4   Title  Pt will ambulate >400' with SPC on level surfaces supervision for improved mobility.    Baseline  supervision with SPC on level surfaces.    Time  4    Period  Weeks    Status  Achieved    Target Date  05/02/19      PT SHORT TERM GOAL #5   Title  Pt will be able to perform sit to stand without UE support from standard chair consistently for improved functional strength.    Baseline  Pt still needs UE support    Time  4    Period  Weeks    Status  On-going    Target Date  05/02/19        PT Long Term Goals - 04/02/19 1221      PT LONG TERM GOAL #1   Title  Pt will ambulate > 500' on varied surfaces with SPC mod I for improved community mobility.    Time  8    Period  Weeks    Status  New    Target Date  06/01/19  PT LONG TERM GOAL #2   Title  Pt will increase Berg from 41/56 to > 48/56 for improved balance  and decreased fall risk.    Time  8    Period  Weeks    Status  New    Target Date  06/01/19      PT LONG TERM GOAL #3   Title  Pt will increase 30 sec sit to stand from 7 reps from standard chair with arms to 10 or more for improved functional strength.    Time  8    Period  Weeks    Status  New    Target Date  06/01/19      PT LONG TERM GOAL #4   Title  Pt will decrease TUG from 16 sec with SPC to <14 sec for improved balance and functional mobility.    Time  8    Period  Weeks    Status  New    Target Date  06/01/19            Plan - 05/01/19 1328    Clinical Impression Statement  Pt continues to show improving stability with gait with SPC requiring less assistance with dynamic gait activities. Pt challenged with eccentric strengthening at left knee today.    Personal Factors and Comorbidities  Comorbidity 3+    Comorbidities  CHF, HTN, hyperlipidemia, glomerulonephritis, pancytopenia/chronic anemia, seizure    Examination-Activity Limitations  Stairs;Stand;Squat;Locomotion Level;Transfers;Bed Mobility    Examination-Participation Restrictions  Community Activity;Shop;Driving;Cleaning;Yard Work    [employer]chant [employer] officer  Evolving/Moderate complexity    Rehab Potential  Good    PT Frequency  2x / week    PT Duration  8 weeks    PT Treatment/Interventions  DME Instruction;ADLs/Self Care Home Management;Gait training;Stair training;Functional mobility training;Therapeutic activities;Therapeutic exercise;Balance training;Orthotic Fit/Training;Patient/family education;Neuromuscular re-education;Wheelchair mobility training;Passive range of motion;Manual techniques    PT Next Visit Plan  Reassess remaining STG next visit including Berg. Continue gait with SPC adding in dynamic gait activities (head turns, obstacles). Standing balance and strengthening to left LE.    PT Home Exercise Plan  Access Code: [email or web address]    Consulted and Agree with Plan of Care  Patient     Family Member Consulted  wife       Patient will benefit from skilled therapeutic intervention in order to improve the following deficits and impairments:  Abnormal gait, Difficulty walking, Decreased balance, Decreased mobility, Decreased strength, Decreased knowledge of use of DME, Decreased endurance, Decreased activity tolerance, Decreased range of motion, Pain, Increased muscle spasms  Visit Diagnosis: Muscle weakness (generalized)  Other abnormalities of gait and mobility     Problem List Patient Active Problem List   Diagnosis Date Noted  . 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 Acute blood loss anemia   . Steroid-induced hyperglycemia   . Acute on chronic anemia   . Seizures ([place])   . Spastic hemiparesis ([place])   . 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 Hypertension   . Thrombocytopenia ([place])   . Acute unilateral cerebral infarction in a watershed distribution [facility]) 01/15/2019  . Weakness   . CVA (cerebral vascular accident) ([place]) 01/12/2019  . Malnutrition of moderate degree 07/31/2018  . Elevated serum protein level   . Multiple myeloma ([place])   . Pancytopenia ([place])   . Normocytic normochromic anemia 07/16/2018  . Acute kidney failure ([place]) 07/16/2018  . NICM (nonischemic cardiomyopathy) ([place]) 11/12/2013  . History of ETOH abuse 11/12/2013  . Chronic combined systolic and diastolic CHF (congestive heart  failure) ([place]) 11/12/2013  . Congestive dilated cardiomyopathy ([place]) 11/04/2013  . Malignant hypertension     Electa Sniff, PT, DPT, NCS 05/01/2019, 1:33 PM  [facility] [address] [place], [place], [address] Phone: [phone]   Fax:  [phone]  Name: Jeremy Grant MRN: [id number] Date of Birth: 04/16/1961

## 2019-05-04 ENCOUNTER — Encounter: Payer: Self-pay | Admitting: Physical Therapy

## 2019-05-04 ENCOUNTER — Other Ambulatory Visit: Payer: Self-pay

## 2019-05-04 ENCOUNTER — Ambulatory Visit: Payer: BC Managed Care – PPO | Admitting: Physical Therapy

## 2019-05-04 ENCOUNTER — Ambulatory Visit: Payer: BC Managed Care – PPO | Admitting: Occupational Therapy

## 2019-05-04 DIAGNOSIS — M6281 Muscle weakness (generalized): Secondary | ICD-10-CM | POA: Diagnosis not present

## 2019-05-04 DIAGNOSIS — I69354 Hemiplegia and hemiparesis following cerebral infarction affecting left non-dominant side: Secondary | ICD-10-CM

## 2019-05-04 DIAGNOSIS — R2681 Unsteadiness on feet: Secondary | ICD-10-CM

## 2019-05-04 DIAGNOSIS — R2689 Other abnormalities of gait and mobility: Secondary | ICD-10-CM

## 2019-05-04 NOTE — Therapy (Signed)
[facility] [address] [place], [place], [address] Phone: [phone]   Fax:  [phone]  Physical Therapy Treatment  Patient Details  Name: Jeremy Grant MRN: [id number] Date of Birth: May 27, 1960 Referring Provider (PT): Dr. Letta Pate   Encounter Date: 05/04/2019  PT End of Session - 05/04/19 0851    Visit Number  21    Number of Visits  32    Date for PT Re-Evaluation  06/11/19    Authorization Type  BCBS    PT Start Time  0848    PT Stop Time  0930    PT Time Calculation (min)  42 min    Equipment Utilized During Treatment  Gait belt   left AFO   Activity Tolerance  Patient tolerated treatment well    Behavior During Therapy  [facility] for tasks assessed/performed       Past Medical History:  Diagnosis Date  . Arthritis   . CHF (congestive heart failure) ([place])   . Combined systolic and diastolic cardiac dysfunction    Echo 11/03/2013 EF 19%, grade 3 diastolic dysfunction  . Hypertension   . NICM (nonischemic cardiomyopathy) ([place])    a. L/RHC (11/05/13): RA: 3, RV 52/5, PA 49/19 (31), PCWP 10, AO 166/93, PA 67%, Fick CO/CI: 5.71/2.97, Lmain: normal, LAD: large, without signficant dz, first diagonal has 20% dz at ostium, LCx: normal, RCA: 30% stenosis at the bifurcation of PDA and PLOM    Past Surgical History:  Procedure Laterality Date  . BIOPSY  01/31/2019   Procedure: BIOPSY;  Surgeon: Otis Brace, MD;  Location: [facility];  Service: Gastroenterology;;  . ESOPHAGOGASTRODUODENOSCOPY N/A 01/31/2019   Procedure: ESOPHAGOGASTRODUODENOSCOPY (EGD);  Surgeon: Otis Brace, MD;  Location: [facility] ENDOSCOPY;  Service: Gastroenterology;  Laterality: N/A;  . IR FLUORO GUIDE CV LINE RIGHT  07/25/2018  . IR US GUIDE VASC ACCESS RIGHT  07/25/2018  . LEFT AND RIGHT HEART CATHETERIZATION WITH CORONARY ANGIOGRAM N/A 11/05/2013   Procedure: LEFT AND RIGHT HEART CATHETERIZATION WITH CORONARY ANGIOGRAM;  Surgeon: Peter  M Martinique, MD;  Location: [facility] CATH LAB;  Service: Cardiovascular;  Laterality: N/A;  . RIGHT HEART CATHETERIZATION N/A 01/14/2014   Procedure: RIGHT HEART CATH;  Surgeon: Larey Dresser, MD;  Location: [facility] CATH LAB;  Service: Cardiovascular;  Laterality: N/A;    There were no vitals filed for this visit.  Subjective Assessment - 05/04/19 0850    Subjective  No new complaints. No issues with using cane at home with spouse.    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 8/31-9/3 then inpatient rehab 9/3-9/23.PMH: CHF, HTN, hyperlipidemia, glomerulonephritis, pancytopenia/chronic anemia, seizure left-sided post stroke    Currently in Pain?  No/denies             [facility] Adult PT Treatment/Exercise - 05/04/19 0852      Transfers   Transfers  Sit to Stand;Stand to Sit    Sit to Stand  6: Modified independent (Device/Increase time)    Stand to Sit  6: Modified independent (Device/Increase time)      Ambulation/Gait   Ambulation/Gait  Yes    Ambulation/Gait Assistance  5: Supervision;4: Min guard    Ambulation/Gait Assistance Details  gait with scanning all directions with up to min guard assist    Ambulation Distance (Feet)  250 Feet   x1, plus around gym with session   Assistive device  Straight cane    Gait Pattern  Step-through pattern;Trunk flexed;Narrow base of support;Poor foot clearance - left    Ambulation Surface  Level;Indoor      Berg Balance Test   Sit to Stand  Able to stand without using hands and stabilize independently    Standing Unsupported  Able to stand safely 2 minutes    Sitting with Back Unsupported but Feet Supported on Floor or Stool  Able to sit safely and securely 2 minutes    Stand to Sit  Sits safely with minimal use of hands    Transfers  Able to transfer safely, minor use of hands    Standing Unsupported with Eyes Closed  Able to stand 10 seconds safely    Standing  Ubsupported with Feet Together  Able to place feet together independently and stand 1 minute safely    From Standing, Reach Forward with Outstretched Arm  Can reach confidently >25 cm (10")    From Standing Position, Pick up Object from Floor  Able to pick up shoe safely and easily    From Standing Position, Turn to Look Behind Over each Shoulder  Looks behind one side only/other side shows less weight shift   left>right   Turn 360 Degrees  Able to turn 360 degrees safely but slowly   >7 sec's each way   Standing Unsupported, Alternately Place Feet on Step/Stool  Able to stand independently and complete 8 steps >20 seconds    Standing Unsupported, One Foot in Front  Able to plae foot ahead of the other independently and hold 30 seconds    Standing on One Leg  Tries to lift leg/unable to hold 3 seconds but remains standing independently    Total Score  48      High Level Balance   High Level Balance Activities  Negotiating over obstacles    High Level Balance Comments  forward stepping over 3 bolsers with cane x 6 laps, min gaurd assist wtith cues on sequenincg.           Balance Exercises - 05/04/19 0913      Balance Exercises: Standing   Standing Eyes Closed  Narrow base of support (BOS);Wide (BOA);Head turns;Foam/compliant surface;Other reps (comment);30 secs;Limitations      Balance Exercises: Standing   Standing Eyes Closed Limitations  on opened dense blue foam: feet close together for EC no head movements, then with feet slightly wider apart for EC head movements left<>right, then up<>down. min guard to min assist for balance with cues on posture/weight shifting to assist with balance.           PT Short Term Goals - 05/04/19 2203      PT SHORT TERM GOAL #1   Title  Pt will be independent with progressive HEP for strength and balance to continue gains on own.    Baseline  Pt reports that he has been working on exercises as instructed.    Status  Achieved      PT SHORT  TERM GOAL #2   Title  Pt will increase gait speed from 0.71ms to >0.810m with SPC for improved gait safety.    Baseline  0.5758mcomfortable and 0.45m29mast with SPC on 04/28/2019    Status  Partially Met      PT SHORT TERM GOAL #3   Title  Pt will increase Berg Balance from 41/56 to >45/56 for improved balance and decreased fall risk.    Baseline  05/04/19: 48/56 scored today    Status  Achieved  Target Date  05/02/19      PT SHORT TERM GOAL #4   Title  Pt will ambulate >400' with SPC on level surfaces supervision for improved mobility.    Baseline  supervision with SPC on level surfaces.    Status  Achieved    Target Date  05/02/19      PT SHORT TERM GOAL #5   Title  Pt will be able to perform sit to stand without UE support from standard chair consistently for improved functional strength.    Baseline  Pt still needs UE support    Status  Not Met        PT Long Term Goals - 04/02/19 1221      PT LONG TERM GOAL #1   Title  Pt will ambulate > 500' on varied surfaces with SPC mod I for improved community mobility.    Time  8    Period  Weeks    Status  New    Target Date  06/01/19      PT LONG TERM GOAL #2   Title  Pt will increase Berg from 41/56 to > 48/56 for improved balance and decreased fall risk.    Time  8    Period  Weeks    Status  New    Target Date  06/01/19      PT LONG TERM GOAL #3   Title  Pt will increase 30 sec sit to stand from 7 reps from standard chair with arms to 10 or more for improved functional strength.    Time  8    Period  Weeks    Status  New    Target Date  06/01/19      PT LONG TERM GOAL #4   Title  Pt will decrease TUG from 16 sec with SPC to <14 sec for improved balance and functional mobility.    Time  8    Period  Weeks    Status  New    Target Date  06/01/19            Plan - 05/04/19 0852    Clinical Impression Statement  Today's skilled session initially focused on Berg Balance test with pt scoring 48/56 today and  meeting the remaining STGs. Remainder of session continued to address dynamic gait with cane and balance reactions on compliant surfaces. Up to min assist needed. The pt is progressing toward goals and should benefit from continued PT to progress toward unmet goals.    Personal Factors and Comorbidities  Comorbidity 3+    Comorbidities  CHF, HTN, hyperlipidemia, glomerulonephritis, pancytopenia/chronic anemia, seizure    Examination-Activity Limitations  Stairs;Stand;Squat;Locomotion Level;Transfers;Bed Mobility    Examination-Participation Restrictions  Community Activity;Shop;Driving;Cleaning;Yard Work    [employer] officer  Evolving/Moderate complexity    Rehab Potential  Good    PT Frequency  2x / week    PT Duration  8 weeks    PT Treatment/Interventions  DME Instruction;ADLs/Self Care Home Management;Gait training;Stair training;Functional mobility training;Therapeutic activities;Therapeutic exercise;Balance training;Orthotic Fit/Training;Patient/family education;Neuromuscular re-education;Wheelchair mobility training;Passive range of motion;Manual techniques    PT Next Visit Plan  Continue gait with SPC adding in dynamic gait activities (head turns, obstacles). Standing balance and strengthening to left LE.    PT Home Exercise Plan  Access Code: [email or web address]    Consulted and Agree with Plan of Care  Patient    Family Member Consulted  wife       Patient will benefit from skilled therapeutic  intervention in order to improve the following deficits and impairments:  Abnormal gait, Difficulty walking, Decreased balance, Decreased mobility, Decreased strength, Decreased knowledge of use of DME, Decreased endurance, Decreased activity tolerance, Decreased range of motion, Pain, Increased muscle spasms  Visit Diagnosis: Hemiplegia and hemiparesis following cerebral infarction affecting left non-dominant side (HCC)  Muscle weakness (generalized)  Other abnormalities of gait and  mobility  Unsteadiness on feet     Problem List Patient Active Problem List   Diagnosis Date Noted  . 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 Acute blood loss anemia   . Steroid-induced hyperglycemia   . Acute on chronic anemia   . Seizures ([place])   . Spastic hemiparesis ([place])   . 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 Hypertension   . Thrombocytopenia ([place])   . Acute unilateral cerebral infarction in a watershed distribution [facility]) 01/15/2019  . Weakness   . CVA (cerebral vascular accident) ([place]) 01/12/2019  . Malnutrition of moderate degree 07/31/2018  . Elevated serum protein level   . Multiple myeloma ([place])   . Pancytopenia ([place])   . Normocytic normochromic anemia 07/16/2018  . Acute kidney failure ([place]) 07/16/2018  . NICM (nonischemic cardiomyopathy) ([place]) 11/12/2013  . History of ETOH abuse 11/12/2013  . Chronic combined systolic and diastolic CHF (congestive heart failure) ([place]) 11/12/2013  . Congestive dilated cardiomyopathy ([place]) 11/04/2013  . Malignant hypertension     [place], [place], [facility] [address], [place], [place] [address] [phone] 05/04/19, 10:09 PM   Name: Jeremy Grant MRN: [id number] Date of Birth: 07-08-1960

## 2019-05-04 NOTE — Therapy (Signed)
[facility] [address] [place], [place], [address] Phone: [phone]   Fax:  [phone]  Occupational Therapy Treatment  Patient Details  Name: Jeremy Grant MRN: [id number] Date of Birth: 02-04-1961 No data recorded  Encounter Date: 05/04/2019  OT End of Session - 05/04/19 0944    Visit Number  8    Number of Visits  13    Date for OT Re-Evaluation  05/24/19    Authorization Type  BC/BS--[place] deductible and oop met in full, prior auth required    OT Start Time  0930    OT Stop Time  1015    OT Time Calculation (min)  45 min    Activity Tolerance  Patient tolerated treatment well    Behavior During Therapy  [facility] for tasks assessed/performed       Past Medical History:  Diagnosis Date  . Arthritis   . CHF (congestive heart failure) ([place])   . Combined systolic and diastolic cardiac dysfunction    Echo 11/03/2013 EF 02%, grade 3 diastolic dysfunction  . Hypertension   . NICM (nonischemic cardiomyopathy) ([place])    a. L/RHC (11/05/13): RA: 3, RV 52/5, PA 49/19 (31), PCWP 10, AO 166/93, PA 67%, Fick CO/CI: 5.71/2.97, Lmain: normal, LAD: large, without signficant dz, first diagonal has 20% dz at ostium, LCx: normal, RCA: 30% stenosis at the bifurcation of PDA and PLOM    Past Surgical History:  Procedure Laterality Date  . BIOPSY  01/31/2019   Procedure: BIOPSY;  Surgeon: Otis Brace, MD;  Location: [facility] ENDOSCOPY;  Service: Gastroenterology;;  . ESOPHAGOGASTRODUODENOSCOPY N/A 01/31/2019   Procedure: ESOPHAGOGASTRODUODENOSCOPY (EGD);  Surgeon: Otis Brace, MD;  Location: [facility] ENDOSCOPY;  Service: Gastroenterology;  Laterality: N/A;  . IR FLUORO GUIDE CV LINE RIGHT  07/25/2018  . IR US GUIDE VASC ACCESS RIGHT  07/25/2018  . LEFT AND RIGHT HEART CATHETERIZATION WITH CORONARY ANGIOGRAM N/A 11/05/2013   Procedure: LEFT AND RIGHT HEART CATHETERIZATION WITH CORONARY ANGIOGRAM;  Surgeon: Peter M Martinique, MD;   Location[facility] CATH LAB;  Service: Cardiovascular;  Laterality: N/A;  . RIGHT HEART CATHETERIZATION N/A 01/14/2014   Procedure: RIGHT HEART CATH;  Surgeon: Larey Dresser, MD;  Location: [facility] CATH LAB;  Service: Cardiovascular;  Laterality: N/A;    There were no vitals filed for this visit.  Subjective Assessment - 05/04/19 0934    Patient is accompanied by:  Family member   WIFE   Pertinent History  watershed infarcts 01/12/19 including corpus collosum, seizures, chronic anemia. PMH: acute kidney failure March 2020, Shingles in May 2020, CHF, HTN, CKD(IV), HLD, auto immune disease    Limitations  seizures (on meds now - has not had any since d/c from hospital)    Currently in Pain?  No/denies   in UE's, however pt had back pain after P.T. session, which limited standing tolerance and tall kneeling activities today      Assessed remaining STG's and progress. Also discussed importance of wt shifts in standing to increase wt over LLE.   Pt standing to place small pegs in pegboard LUE with board placed more to Lt side to encourage wt shifts. Pt followed design at 100% accuracy. Pt noted to resist shifting weight to Lt and Lt knee bent w/ mod cueing to straighten.   Tall kneeling to short kneeling for pelvic control - pt had max difficulty following movement even w/ demo cues and noted weakness in pelvis, however pt also had reports of back pain after  P.T. session which may have contributed to difficulty. Discontinued ex and perform pelvic control ex's at wall. Pt compensating by placing more weight on Rt side.   UBE x 5 min level 5 for UE conditioning.                       OT Short Term Goals - 05/04/19 1021      OT SHORT TERM GOAL #1   Title  Independent with LUE HEP    Time  3    Period  Weeks    Status  Achieved      OT SHORT TERM GOAL #2   Title  Grip strength Lt hand to be 60 lbs or greater    Baseline  Lt = 58 lbs (Rt = 64 lbs)    Time  3    Period  Weeks     Status  Achieved   05/04/19 = 64 lbs     OT SHORT TERM GOAL #3   Title  Pt to stand to fold clothes and wash dishes x 10 min w/o rest    Time  3    Period  Weeks    Status  Partially Met   standing to make coffee, snack     OT SHORT TERM GOAL #4   Title  Pt to consistently go into kitchen and make simple snack/sandwich at distant sup level    Time  3    Period  Weeks    Status  Achieved   05/01/19       OT Long Term Goals - 03/24/19 1452      OT LONG TERM GOAL #1   Title  Independent with updated HEP    Time  6    Period  Weeks    Status  New      OT LONG TERM GOAL #2   Title  Pt able to place 3 lb object on high level shelf LUE 5/5 trials w/ min compensations and no pain    Time  6    Period  Weeks    Status  New      OT LONG TERM GOAL #3   Title  Pt to make simple breakfast mod I level safely using DME prn and fall prevention techniques    Time  6    Period  Weeks    Status  New            Plan - 05/04/19 1022    Clinical Impression Statement  Pt is continuing to progres towards goals with improving balance, activity tolerance, and LUE strength.  Pt continues to need min v.c. for LLE placement/incr wt. shift and needs UE support when lifting LLE. Pt also w/ increased back pain today which limited more challenging standing and tall kneeling activities    Occupational performance deficits (Please refer to evaluation for details):  IADL's;Leisure;Social Participation;Work    Marketing executive / Function / Physical Skills  ADL;IADL;Endurance;Body mechanics;Mobility;Strength;Coordination;UE functional use;Pain    Rehab Potential  Good    OT Frequency  2x / week    OT Duration  6 weeks    OT Treatment/Interventions  Self-care/ADL training;Therapeutic exercise;Functional Mobility Training;Neuromuscular education;Energy conservation;Therapeutic activities;Coping strategies training;DME and/or AE instruction;Passive range of motion;Patient/family education;Moist Heat     Plan  standing and wt shifts, quadraped and tall kneeling to work on pelvic control and wt shifts    Consulted and Agree with Plan of Care  Patient;Family member/caregiver  Patient will benefit from skilled therapeutic intervention in order to improve the following deficits and impairments:   Body Structure / Function / Physical Skills: ADL, IADL, Endurance, Body mechanics, Mobility, Strength, Coordination, UE functional use, Pain       Visit Diagnosis: Hemiplegia and hemiparesis following cerebral infarction affecting left non-dominant side (HCC)  Muscle weakness (generalized)  Unsteadiness on feet    Problem List Patient Active Problem List   Diagnosis Date Noted  . 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 Acute blood loss anemia   . Steroid-induced hyperglycemia   . Acute on chronic anemia   . Seizures ([place])   . Spastic hemiparesis ([place])   . 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 Hypertension   . Thrombocytopenia ([place])   . Acute unilateral cerebral infarction in a watershed distribution [facility]) 01/15/2019  . Weakness   . CVA (cerebral vascular accident) ([place]) 01/12/2019  . Malnutrition of moderate degree 07/31/2018  . Elevated serum protein level   . Multiple myeloma ([place])   . Pancytopenia ([place])   . Normocytic normochromic anemia 07/16/2018  . Acute kidney failure ([place]) 07/16/2018  . NICM (nonischemic cardiomyopathy) ([place]) 11/12/2013  . History of ETOH abuse 11/12/2013  . Chronic combined systolic and diastolic CHF (congestive heart failure) ([place]) 11/12/2013  . Congestive dilated cardiomyopathy ([place]) 11/04/2013  . Malignant hypertension     Carey Bullocks, OTR/L 05/04/2019, 10:25 AM  [facility] [address] [place], [place], [address] Phone: [phone]   Fax:  [phone]  Name: Jeremy Grant MRN: [id number] Date of Birth: Aug 27, 1960

## 2019-05-11 ENCOUNTER — Other Ambulatory Visit: Payer: Self-pay

## 2019-05-11 ENCOUNTER — Encounter (HOSPITAL_COMMUNITY): Payer: BC Managed Care – PPO

## 2019-05-11 ENCOUNTER — Ambulatory Visit (HOSPITAL_COMMUNITY)
Admission: RE | Admit: 2019-05-11 | Discharge: 2019-05-11 | Disposition: A | Discharge status: Home / Self Care | Payer: BC Managed Care – PPO | Source: Ambulatory Visit | Attending: Nephrology | Admitting: Nephrology

## 2019-05-11 VITALS — BP 167/68 | HR 64 | Temp 97.0°F | Resp 18

## 2019-05-11 DIAGNOSIS — N179 Acute kidney failure, unspecified: Secondary | ICD-10-CM | POA: Insufficient documentation

## 2019-05-11 LAB — IRON AND TIBC
Iron: 101 ug/dL (ref 45–182)
Saturation Ratios: 35 % (ref 17.9–39.5)
TIBC: 288 ug/dL (ref 250–450)
UIBC: 187 ug/dL

## 2019-05-11 LAB — FERRITIN: Ferritin: 888 ng/mL — ABNORMAL HIGH (ref 24–336)

## 2019-05-11 LAB — POCT HEMOGLOBIN-HEMACUE: Hemoglobin: 9.8 g/dL — ABNORMAL LOW (ref 13.0–17.0)

## 2019-05-11 MED ORDER — EPOETIN ALFA-EPBX 10000 UNIT/ML IJ SOLN
20000.0000 [IU] | INTRAMUSCULAR | Status: DC
  Administered 2019-05-11: 20000 [IU] via SUBCUTANEOUS

## 2019-05-11 MED ORDER — EPOETIN ALFA-EPBX 10000 UNIT/ML IJ SOLN
INTRAMUSCULAR | Status: AC
  Filled 2019-05-11: qty 2

## 2019-05-12 ENCOUNTER — Other Ambulatory Visit: Payer: Self-pay

## 2019-05-12 ENCOUNTER — Ambulatory Visit: Payer: BC Managed Care – PPO | Admitting: Occupational Therapy

## 2019-05-12 ENCOUNTER — Ambulatory Visit: Payer: BC Managed Care – PPO

## 2019-05-12 DIAGNOSIS — M6281 Muscle weakness (generalized): Secondary | ICD-10-CM

## 2019-05-12 DIAGNOSIS — R2681 Unsteadiness on feet: Secondary | ICD-10-CM

## 2019-05-12 DIAGNOSIS — I69354 Hemiplegia and hemiparesis following cerebral infarction affecting left non-dominant side: Secondary | ICD-10-CM

## 2019-05-12 DIAGNOSIS — R2689 Other abnormalities of gait and mobility: Secondary | ICD-10-CM

## 2019-05-12 NOTE — Therapy (Signed)
[facility] [address] [place], [place], [address] Phone: [phone]   Fax:  [phone]  Occupational Therapy Treatment  Patient Details  Name: Jeremy Grant MRN: [id number] Date of Birth: 02/13/1961 No data recorded  Encounter Date: 05/12/2019  OT End of Session - 05/12/19 1501    Visit Number  9    Number of Visits  13    Date for OT Re-Evaluation  05/24/19    Authorization Type  BC/BS--[place] deductible and oop met in full, prior auth required    OT Start Time  0930    OT Stop Time  1015    OT Time Calculation (min)  45 min    Activity Tolerance  Patient tolerated treatment well    Behavior During Therapy  [facility] for tasks assessed/performed       Past Medical History:  Diagnosis Date  . Arthritis   . CHF (congestive heart failure) ([place])   . Combined systolic and diastolic cardiac dysfunction    Echo 11/03/2013 EF 40%, grade 3 diastolic dysfunction  . Hypertension   . NICM (nonischemic cardiomyopathy) ([place])    a. L/RHC (11/05/13): RA: 3, RV 52/5, PA 49/19 (31), PCWP 10, AO 166/93, PA 67%, Fick CO/CI: 5.71/2.97, Lmain: normal, LAD: large, without signficant dz, first diagonal has 20% dz at ostium, LCx: normal, RCA: 30% stenosis at the bifurcation of PDA and PLOM    Past Surgical History:  Procedure Laterality Date  . BIOPSY  01/31/2019   Procedure: BIOPSY;  Surgeon: Otis Brace, MD;  Location: [facility] ENDOSCOPY;  Service: Gastroenterology;;  . ESOPHAGOGASTRODUODENOSCOPY N/A 01/31/2019   Procedure: ESOPHAGOGASTRODUODENOSCOPY (EGD);  Surgeon: Otis Brace, MD;  Location: [facility] ENDOSCOPY;  Service: Gastroenterology;  Laterality: N/A;  . IR FLUORO GUIDE CV LINE RIGHT  07/25/2018  . IR US GUIDE VASC ACCESS RIGHT  07/25/2018  . LEFT AND RIGHT HEART CATHETERIZATION WITH CORONARY ANGIOGRAM N/A 11/05/2013   Procedure: LEFT AND RIGHT HEART CATHETERIZATION WITH CORONARY ANGIOGRAM;  Surgeon: Peter M Martinique, MD;   Location: [facility] CATH LAB;  Service: Cardiovascular;  Laterality: N/A;  . RIGHT HEART CATHETERIZATION N/A 01/14/2014   Procedure: RIGHT HEART CATH;  Surgeon: Larey Dresser, MD;  Location: [facility] CATH LAB;  Service: Cardiovascular;  Laterality: N/A;    There were no vitals filed for this visit.  Subjective Assessment - 05/12/19 0936    Subjective   Wife reports he has been practicing wt shifts at home    Patient is accompanied by:  Family member   wife   Pertinent History  watershed infarcts 01/12/19 including corpus collosum, seizures, chronic anemia. PMH: acute kidney failure March 2020, Shingles in May 2020, CHF, HTN, CKD(IV), HLD, auto immune disease    Limitations  seizures (on meds now - has not had any since d/c from hospital)    Currently in Pain?  No/denies        Standing: wt shifts to Lt side with diagonal patterns (holding ball BUEs) and squat to stand activities.  Tall kneeling: A/P pelvic tilts w/ short to tall kneeling, bilateral trunk rotation with BUE's holding ball, and diagonal patterns for wt shifts.  Seated on physioball: worked on A/P pelvic tilts, lateral trunk flexion, and marches to activate lateral trunk.  Pt demo increased control in standing and tall kneeling today compared to last session.                      OT Short Term Goals - 05/04/19  [place] #1   Title  Independent with LUE HEP    Time  3    Period  Weeks    Status  Achieved      OT SHORT TERM GOAL #2   Title  Grip strength Lt hand to be 60 lbs or greater    Baseline  Lt = 58 lbs (Rt = 64 lbs)    Time  3    Period  Weeks    Status  Achieved   05/04/19 = 64 lbs     OT SHORT TERM GOAL #3   Title  Pt to stand to fold clothes and wash dishes x 10 min w/o rest    Time  3    Period  Weeks    Status  Partially Met   standing to make coffee, snack     OT SHORT TERM GOAL #4   Title  Pt to consistently go into kitchen and make simple snack/sandwich at distant sup  level    Time  3    Period  Weeks    Status  Achieved   05/01/19       OT Long Term Goals - 03/24/19 1452      OT LONG TERM GOAL #1   Title  Independent with updated HEP    Time  6    Period  Weeks    Status  New      OT LONG TERM GOAL #2   Title  Pt able to place 3 lb object on high level shelf LUE 5/5 trials w/ min compensations and no pain    Time  6    Period  Weeks    Status  New      OT LONG TERM GOAL #3   Title  Pt to make simple breakfast mod I level safely using DME prn and fall prevention techniques    Time  6    Period  Weeks    Status  New            Plan - 05/12/19 1502    Clinical Impression Statement  Pt with improvement in pelvic control and wt shifts to Lt side today.    Occupational performance deficits (Please refer to evaluation for details):  IADL's;Leisure;Social Participation;Work    Marketing executive / Function / Physical Skills  ADL;IADL;Endurance;Body mechanics;Mobility;Strength;Coordination;UE functional use;Pain    Rehab Potential  Good    OT Frequency  2x / week    OT Duration  6 weeks    OT Treatment/Interventions  Self-care/ADL training;Therapeutic exercise;Functional Mobility Training;Neuromuscular education;Energy conservation;Therapeutic activities;Coping strategies training;DME and/or AE instruction;Passive range of motion;Patient/family education;Moist Heat    Plan  progress towards remaining goals, high level closed chain reaching, strengthening in supine LUE    Consulted and Agree with Plan of Care  Patient;Family member/caregiver    Family Member Consulted  wife       Patient will benefit from skilled therapeutic intervention in order to improve the following deficits and impairments:   Body Structure / Function / Physical Skills: ADL, IADL, Endurance, Body mechanics, Mobility, Strength, Coordination, UE functional use, Pain       Visit Diagnosis: Hemiplegia and hemiparesis following cerebral infarction affecting left  non-dominant side (HCC)  Unsteadiness on feet  Muscle weakness (generalized)    Problem List Patient Active Problem List   Diagnosis Date Noted  . 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 Acute blood loss anemia   . Steroid-induced  hyperglycemia   . Acute on chronic anemia   . Seizures ([place])   . Spastic hemiparesis ([place])   . 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 Hypertension   . Thrombocytopenia ([place])   . Acute unilateral cerebral infarction in a watershed distribution [facility]) 01/15/2019  . Weakness   . CVA (cerebral vascular accident) ([place]) 01/12/2019  . Malnutrition of moderate degree 07/31/2018  . Elevated serum protein level   . Multiple myeloma ([place])   . Pancytopenia ([place])   . Normocytic normochromic anemia 07/16/2018  . Acute kidney failure ([place]) 07/16/2018  . NICM (nonischemic cardiomyopathy) ([place]) 11/12/2013  . History of ETOH abuse 11/12/2013  . Chronic combined systolic and diastolic CHF (congestive heart failure) ([place]) 11/12/2013  . Congestive dilated cardiomyopathy ([place]) 11/04/2013  . Malignant hypertension     Carey Bullocks, OTR/L 05/12/2019, 3:04 PM  [facility] [address] [place], [place], [address] Phone: [phone]   Fax:  [phone]  Name: JAHSHUA BONITO MRN: [id number] Date of Birth: Aug 27, 1960

## 2019-05-12 NOTE — Therapy (Signed)
[facility] [address] [place], [place], [address] Phone: [phone]   Fax:  [phone]  Physical Therapy Treatment  Patient Details  Name: Jeremy Grant MRN: [id number] Date of Birth: [birth year] Referring Provider (PT): Dr. Letta Pate   Encounter Date: 05/12/2019  PT End of Session - 05/12/19 0846    Visit Number  22    Number of Visits  32    Date for PT Re-Evaluation  06/11/19    Authorization Type  BCBS    PT Start Time  0845    PT Stop Time  0930    PT Time Calculation (min)  45 min    Equipment Utilized During Treatment  Gait belt   left AFO   Activity Tolerance  Patient tolerated treatment well    Behavior During Therapy  [facility] for tasks assessed/performed       Past Medical History:  Diagnosis Date  . Arthritis   . CHF (congestive heart failure) ([place])   . Combined systolic and diastolic cardiac dysfunction    Echo 11/03/2013 EF 14%, grade 3 diastolic dysfunction  . Hypertension   . NICM (nonischemic cardiomyopathy) ([place])    a. L/RHC (11/05/13): RA: 3, RV 52/5, PA 49/19 (31), PCWP 10, AO 166/93, PA 67%, Fick CO/CI: 5.71/2.97, Lmain: normal, LAD: large, without signficant dz, first diagonal has 20% dz at ostium, LCx: normal, RCA: 30% stenosis at the bifurcation of PDA and PLOM    Past Surgical History:  Procedure Laterality Date  . BIOPSY  01/31/2019   Procedure: BIOPSY;  Surgeon: Otis Brace, MD;  Location: [facility] ENDOSCOPY;  Service: Gastroenterology;;  . ESOPHAGOGASTRODUODENOSCOPY N/A 01/31/2019   Procedure: ESOPHAGOGASTRODUODENOSCOPY (EGD);  Surgeon: Otis Brace, MD;  Location: [facility] ENDOSCOPY;  Service: Gastroenterology;  Laterality: N/A;  . IR FLUORO GUIDE CV LINE RIGHT  07/25/2018  . IR US GUIDE VASC ACCESS RIGHT  07/25/2018  . LEFT AND RIGHT HEART CATHETERIZATION WITH CORONARY ANGIOGRAM N/A 11/05/2013   Procedure: LEFT AND RIGHT HEART CATHETERIZATION WITH CORONARY ANGIOGRAM;  Surgeon: Peter  M Martinique, MD;  Location: [facility] CATH LAB;  Service: Cardiovascular;  Laterality: N/A;  . RIGHT HEART CATHETERIZATION N/A 01/14/2014   Procedure: RIGHT HEART CATH;  Surgeon: Larey Dresser, MD;  Location: [facility] CATH LAB;  Service: Cardiovascular;  Laterality: N/A;    There were no vitals filed for this visit.  Subjective Assessment - 05/12/19 0846    Subjective  Pt reports continuing to use cane more at home and going well. Wife reports he is now able to get up by himself in the morning and get ready on own.    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 8/31-9/3 then inpatient rehab 9/3-9/23.PMH: CHF, HTN, hyperlipidemia, glomerulonephritis, pancytopenia/chronic anemia, seizure left-sided post stroke    Currently in Pain?  No/denies                       [facility] Adult PT Treatment/Exercise - 05/12/19 0856      Bed Mobility   Bed Mobility  Supine to Sit;Sit to Supine    Supine to Sit  Independent    Sit to Supine  Independent      Transfers   Transfers  Sit to Stand;Stand to Sit    Sit to Stand  6: Modified independent (Device/Increase time)    Stand to Sit  6: Modified independent (Device/Increase time)      Ambulation/Gait  Ambulation/Gait  Yes    Ambulation/Gait Assistance  5: Supervision    Ambulation/Gait Assistance Details  Pt was given verbal cues for upright posture and increased left foot clearance.    Ambulation Distance (Feet)  750 Feet    Assistive device  Straight cane    Gait Pattern  Step-through pattern    Ambulation Surface  Level;Indoor    Stairs  Yes    Stair Management Technique  One rail Left   cane right   Number of Stairs  8    Gait Comments  During course of gait patient performed scanning environment, stop and go on command and speed changes.      Neuro Re-ed    Neuro Re-ed Details   Pt performed dynamic gait activities: gait with SPC with 180 degree turns on  command with SPC x 6 bouts, gait forwards with large steps and backwards 20' x 2 each CGA. Reciprocal steps over 3 foams and 2 yard sticks x 4 laps CGA. In // bars: walking without UE support 6' x 6, side stepping without UE support 6' x 6, gait over mat with 1 light UE support 6' x 4 forwards and backwards, marching in place on mat without UE support x 20. As left leg fatigued patient was less stable during stance.      Exercises   Exercises  Other Exercises    Other Exercises   Step ups on 6" step with left LE with 1 UE support x 10 with verbal cues to shift over.  Supine bridges with PT providing resistance for hip abd duirng as well. Gave red theraband to add for home             PT Education - 05/12/19 1650    Education Details  Pt instructed to work more on bridges in [place]. Issued theraband to add resistance to hip abduction when performs.    Person(s) Educated  Patient;Spouse    Methods  Explanation;Demonstration    Comprehension  Verbalized understanding;Returned demonstration       PT Short Term Goals - 05/04/19 2203      PT SHORT TERM GOAL #1   Title  Pt will be independent with progressive HEP for strength and balance to continue gains on own.    Baseline  Pt reports that he has been working on exercises as instructed.    Status  Achieved      PT SHORT TERM GOAL #2   Title  Pt will increase gait speed from 0.76ms to >0.829m with SPC for improved gait safety.    Baseline  0.5719mcomfortable and 0.44m55mast with SPC on 04/28/2019    Status  Partially Met      PT SHORT TERM GOAL #3   Title  Pt will increase Berg Balance from 41/56 to >45/56 for improved balance and decreased fall risk.    Baseline  05/04/19: 48/56 scored today    Status  Achieved    Target Date  05/02/19      PT SHORT TERM GOAL #4   Title  Pt will ambulate >400' with SPC on level surfaces supervision for improved mobility.    Baseline  supervision with SPC on level surfaces.    Status  Achieved     Target Date  05/02/19      PT SHORT TERM GOAL #5   Title  Pt will be able to perform sit to stand without UE support from standard chair consistently for improved functional strength.  Baseline  Pt still needs UE support    Status  Not Met        PT Long Term Goals - 04/02/19 1221      PT LONG TERM GOAL #1   Title  Pt will ambulate > 500' on varied surfaces with SPC mod I for improved community mobility.    Time  8    Period  Weeks    Status  New    Target Date  06/01/19      PT LONG TERM GOAL #2   Title  Pt will increase Berg from 41/56 to > 48/56 for improved balance and decreased fall risk.    Time  8    Period  Weeks    Status  New    Target Date  06/01/19      PT LONG TERM GOAL #3   Title  Pt will increase 30 sec sit to stand from 7 reps from standard chair with arms to 10 or more for improved functional strength.    Time  8    Period  Weeks    Status  New    Target Date  06/01/19      PT LONG TERM GOAL #4   Title  Pt will decrease TUG from 16 sec with SPC to <14 sec for improved balance and functional mobility.    Time  8    Period  Weeks    Status  New    Target Date  06/01/19            Plan - 05/12/19 1651    Clinical Impression Statement  Pt continues to show improvement in gait quality with SPC with improving stability. Pt required less rest breaks during session today.    Personal Factors and Comorbidities  Comorbidity 3+    Comorbidities  CHF, HTN, hyperlipidemia, glomerulonephritis, pancytopenia/chronic anemia, seizure    Examination-Activity Limitations  Stairs;Stand;Squat;Locomotion Level;Transfers;Bed Mobility    Examination-Participation Restrictions  Community Activity;Shop;Driving;Cleaning;Yard Work    Merchant navy officer  Evolving/Moderate complexity    Rehab Potential  Good    PT Frequency  2x / week    PT Duration  8 weeks    PT Treatment/Interventions  DME Instruction;ADLs/Self Care Home Management;Gait training;Stair  training;Functional mobility training;Therapeutic activities;Therapeutic exercise;Balance training;Orthotic Fit/Training;Patient/family education;Neuromuscular re-education;Wheelchair mobility training;Passive range of motion;Manual techniques    PT Next Visit Plan  Continue gait with SPC adding in dynamic gait activities (head turns, obstacles). Standing balance and strengthening to left LE.    PT Home Exercise Plan  Access Code: [email or web address]    Consulted and Agree with Plan of Care  Patient    Family Member Consulted  wife       Patient will benefit from skilled therapeutic intervention in order to improve the following deficits and impairments:  Abnormal gait, Difficulty walking, Decreased balance, Decreased mobility, Decreased strength, Decreased knowledge of use of DME, Decreased endurance, Decreased activity tolerance, Decreased range of motion, Pain, Increased muscle spasms  Visit Diagnosis: Muscle weakness (generalized)  Other abnormalities of gait and mobility     Problem List Patient Active Problem List   Diagnosis Date Noted  . 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 Acute blood loss anemia   . Steroid-induced hyperglycemia   . Acute on chronic anemia   . Seizures ([place])   . Spastic hemiparesis ([place])   . 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 Hypertension   . Thrombocytopenia ([place])   . Acute unilateral cerebral infarction in a watershed distribution [facility])  01/15/2019  . Weakness   . CVA (cerebral vascular accident) ([place]) 01/12/2019  . Malnutrition of moderate degree 07/31/2018  . Elevated serum protein level   . Multiple myeloma ([place])   . Pancytopenia ([place])   . Normocytic normochromic anemia 07/16/2018  . Acute kidney failure ([place]) 07/16/2018  . NICM (nonischemic cardiomyopathy) ([place]) 11/12/2013  . History of ETOH abuse 11/12/2013  . Chronic combined systolic and diastolic CHF (congestive heart failure) ([place]) 11/12/2013  . Congestive dilated cardiomyopathy ([place]) 11/04/2013  . Malignant  hypertension     Electa Sniff, PT, DPT, NCS 05/12/2019, 4:56 PM  [facility] [address] [place], [place], [address] Phone: [phone]   Fax:  [phone]  Name: Jeremy Grant MRN: [id number] Date of Birth: Aug 26, 1960

## 2019-05-14 ENCOUNTER — Ambulatory Visit: Payer: BC Managed Care – PPO

## 2019-05-14 ENCOUNTER — Other Ambulatory Visit: Payer: Self-pay

## 2019-05-14 ENCOUNTER — Ambulatory Visit: Payer: BC Managed Care – PPO | Admitting: Occupational Therapy

## 2019-05-14 DIAGNOSIS — M6281 Muscle weakness (generalized): Secondary | ICD-10-CM

## 2019-05-14 DIAGNOSIS — R2689 Other abnormalities of gait and mobility: Secondary | ICD-10-CM

## 2019-05-14 DIAGNOSIS — I69354 Hemiplegia and hemiparesis following cerebral infarction affecting left non-dominant side: Secondary | ICD-10-CM

## 2019-05-14 NOTE — Therapy (Signed)
[facility] [address] [place], [place], [address] Phone: [phone]   Fax:  [phone]  Occupational Therapy Treatment  Patient Details  Name: Jeremy Grant MRN: [id number] Date of Birth: 06/02/1960 No data recorded  Encounter Date: 05/14/2019  OT End of Session - 05/14/19 1418    Visit Number  10    Number of Visits  13    Date for OT Re-Evaluation  05/24/19    Authorization Type  BC/BS--[place] deductible and oop met in full, prior auth required    OT Start Time  1100    OT Stop Time  1145    OT Time Calculation (min)  45 min    Activity Tolerance  Patient tolerated treatment well    Behavior During Therapy  [facility] for tasks assessed/performed       Past Medical History:  Diagnosis Date  . Arthritis   . CHF (congestive heart failure) ([place])   . Combined systolic and diastolic cardiac dysfunction    Echo 11/03/2013 EF 78%, grade 3 diastolic dysfunction  . Hypertension   . NICM (nonischemic cardiomyopathy) ([place])    a. L/RHC (11/05/13): RA: 3, RV 52/5, PA 49/19 (31), PCWP 10, AO 166/93, PA 67%, Fick CO/CI: 5.71/2.97, Lmain: normal, LAD: large, without signficant dz, first diagonal has 20% dz at ostium, LCx: normal, RCA: 30% stenosis at the bifurcation of PDA and PLOM    Past Surgical History:  Procedure Laterality Date  . BIOPSY  01/31/2019   Procedure: BIOPSY;  Surgeon: Otis Brace, MD;  Location: [facility] ENDOSCOPY;  Service: Gastroenterology;;  . ESOPHAGOGASTRODUODENOSCOPY N/A 01/31/2019   Procedure: ESOPHAGOGASTRODUODENOSCOPY (EGD);  Surgeon: Otis Brace, MD;  Location: [facility] ENDOSCOPY;  Service: Gastroenterology;  Laterality: N/A;  . IR FLUORO GUIDE CV LINE RIGHT  07/25/2018  . IR US GUIDE VASC ACCESS RIGHT  07/25/2018  . LEFT AND RIGHT HEART CATHETERIZATION WITH CORONARY ANGIOGRAM N/A 11/05/2013   Procedure: LEFT AND RIGHT HEART CATHETERIZATION WITH CORONARY ANGIOGRAM;  Surgeon: Peter M Martinique, MD;   Location: [facility] CATH LAB;  Service: Cardiovascular;  Laterality: N/A;  . RIGHT HEART CATHETERIZATION N/A 01/14/2014   Procedure: RIGHT HEART CATH;  Surgeon: Larey Dresser, MD;  Location: [facility] CATH LAB;  Service: Cardiovascular;  Laterality: N/A;    There were no vitals filed for this visit.  Subjective Assessment - 05/14/19 1106    Patient is accompanied by:  Family member    Pertinent History  watershed infarcts 01/12/19 including corpus collosum, seizures, chronic anemia. PMH: acute kidney failure March 2020, Shingles in May 2020, CHF, HTN, CKD(IV), HLD, auto immune disease    Limitations  seizures (on meds now - has not had any since d/c from hospital)    Currently in Pain?  No/denies       Supine: performed LUE sh flexion with 3 lb weight w/ cues to prevent compensations as pt likes to drift into abduction. Followed by circumduction ex's in 90* sh flexion for control and scapula stability.  Seated: High level AA/ROM LUE using UE Ranger and min cues to keep elbow down and prevent sh abduction.  Standing: BUE AA/ROM w/ ball along wall x 10, followed by scapula retraction for high level flexion off ball w/ mod tactile cues provided to scapula and Lt knee. Also worked on dynamic standing shifting weight w/ ipsilateral and contralateral reaching, and stand to squat and back to standing w/ focus on wt evenly through LE's.  OT Short Term Goals - 05/04/19 1021      OT SHORT TERM GOAL #1   Title  Independent with LUE HEP    Time  3    Period  Weeks    Status  Achieved      OT SHORT TERM GOAL #2   Title  Grip strength Lt hand to be 60 lbs or greater    Baseline  Lt = 58 lbs (Rt = 64 lbs)    Time  3    Period  Weeks    Status  Achieved   05/04/19 = 64 lbs     OT SHORT TERM GOAL #3   Title  Pt to stand to fold clothes and wash dishes x 10 min w/o rest    Time  3    Period  Weeks    Status  Partially Met   standing to make coffee, snack     OT SHORT  TERM GOAL #4   Title  Pt to consistently go into kitchen and make simple snack/sandwich at distant sup level    Time  3    Period  Weeks    Status  Achieved   05/01/19       OT Long Term Goals - 05/14/19 1144      OT LONG TERM GOAL #1   Title  Independent with updated HEP    Time  6    Period  Weeks    Status  Achieved      OT LONG TERM GOAL #2   Title  Pt able to place 3 lb object on high level shelf LUE 5/5 trials w/ min compensations and no pain    Time  6    Period  Weeks    Status  On-going      OT LONG TERM GOAL #3   Title  Pt to make simple breakfast mod I level safely using DME prn and fall prevention techniques    Time  6    Period  Weeks    Status  Achieved            Plan - 05/14/19 1419    Clinical Impression Statement  Pt progressing well towards goals and LUE strength w/ cues needed for positioning. Pt improving w/ functional balance and wt shifts.    Occupational performance deficits (Please refer to evaluation for details):  IADL's;Leisure;Social Participation;Work    Marketing executive / Function / Physical Skills  ADL;IADL;Endurance;Body mechanics;Mobility;Strength;Coordination;UE functional use;Pain    Rehab Potential  Good    OT Frequency  2x / week    OT Duration  6 weeks    OT Treatment/Interventions  Self-care/ADL training;Therapeutic exercise;Functional Mobility Training;Neuromuscular education;Energy conservation;Therapeutic activities;Coping strategies training;DME and/or AE instruction;Passive range of motion;Patient/family education;Moist Heat    Plan  work on functional reaching w/ light weight on LUE in standing w/ wt shifts    Consulted and Agree with Plan of Care  Patient;Family member/caregiver    Family Member Consulted  wife       Patient will benefit from skilled therapeutic intervention in order to improve the following deficits and impairments:   Body Structure / Function / Physical Skills: ADL, IADL, Endurance, Body mechanics,  Mobility, Strength, Coordination, UE functional use, Pain       Visit Diagnosis: Hemiplegia and hemiparesis following cerebral infarction affecting left non-dominant side (HCC)  Muscle weakness (generalized)    Problem List Patient Active Problem List   Diagnosis Date Noted  . Spasm  03/23/2019  . Acute blood loss anemia   . Steroid-induced hyperglycemia   . Acute on chronic anemia   . Seizures ([place])   . Spastic hemiparesis ([place])   . 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 Hypertension   . Thrombocytopenia ([place])   . Acute unilateral cerebral infarction in a watershed distribution [facility]) 01/15/2019  . Weakness   . CVA (cerebral vascular accident) ([place]) 01/12/2019  . Malnutrition of moderate degree 07/31/2018  . Elevated serum protein level   . Multiple myeloma ([place])   . Pancytopenia ([place])   . Normocytic normochromic anemia 07/16/2018  . Acute kidney failure ([place]) 07/16/2018  . NICM (nonischemic cardiomyopathy) ([place]) 11/12/2013  . History of ETOH abuse 11/12/2013  . Chronic combined systolic and diastolic CHF (congestive heart failure) ([place]) 11/12/2013  . Congestive dilated cardiomyopathy ([place]) 11/04/2013  . Malignant hypertension     Carey Bullocks, OTR/L 05/14/2019, 2:22 PM  [facility] [address] [place], [place], [address] Phone: [phone]   Fax:  [phone]  Name: Jeremy Grant MRN: [id number] Date of Birth: 1960-08-25

## 2019-05-14 NOTE — Therapy (Signed)
[facility] [address] [place], [place], [address] Phone: [phone]   Fax:  [phone]  Physical Therapy Treatment  Patient Details  Name: Jeremy Grant MRN: [id number] Date of Birth: Jun 01, 1960 Referring Provider (PT): Dr. Letta Pate   Encounter Date: 05/14/2019  PT End of Session - 05/14/19 1146    Visit Number  23    Number of Visits  32    Date for PT Re-Evaluation  06/11/19    Authorization Type  BCBS    PT Start Time  1146    PT Stop Time  1230    PT Time Calculation (min)  44 min    Equipment Utilized During Treatment  Gait belt   left AFO   Activity Tolerance  Patient tolerated treatment well    Behavior During Therapy  [facility] for tasks assessed/performed       Past Medical History:  Diagnosis Date  . Arthritis   . CHF (congestive heart failure) ([place])   . Combined systolic and diastolic cardiac dysfunction    Echo 11/03/2013 EF 39%, grade 3 diastolic dysfunction  . Hypertension   . NICM (nonischemic cardiomyopathy) ([place])    a. L/RHC (11/05/13): RA: 3, RV 52/5, PA 49/19 (31), PCWP 10, AO 166/93, PA 67%, Fick CO/CI: 5.71/2.97, Lmain: normal, LAD: large, without signficant dz, first diagonal has 20% dz at ostium, LCx: normal, RCA: 30% stenosis at the bifurcation of PDA and PLOM    Past Surgical History:  Procedure Laterality Date  . BIOPSY  01/31/2019   Procedure: BIOPSY;  Surgeon: Otis Brace, MD;  Location: [facility] ENDOSCOPY;  Service: Gastroenterology;;  . ESOPHAGOGASTRODUODENOSCOPY N/A 01/31/2019   Procedure: ESOPHAGOGASTRODUODENOSCOPY (EGD);  Surgeon: Otis Brace, MD;  [facility];  Service: Gastroenterology;  Laterality: N/A;  . IR FLUORO GUIDE CV LINE RIGHT  07/25/2018  . IR US GUIDE VASC ACCESS RIGHT  07/25/2018  . LEFT AND RIGHT HEART CATHETERIZATION WITH CORONARY ANGIOGRAM N/A 11/05/2013   Procedure: LEFT AND RIGHT HEART CATHETERIZATION WITH CORONARY ANGIOGRAM;  Surgeon: Peter  M Martinique, MD;  Location: [facility] CATH LAB;  Service: Cardiovascular;  Laterality: N/A;  . RIGHT HEART CATHETERIZATION N/A 01/14/2014   Procedure: RIGHT HEART CATH;  Surgeon: Larey Dresser, MD;  Location: [facility] CATH LAB;  Service: Cardiovascular;  Laterality: N/A;    There were no vitals filed for this visit.  Subjective Assessment - 05/14/19 1147    Subjective  Pt just finished with OT. Pt's wife reports he was able to fill 2 water bottles and carry then across kitchen.    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 8/31-9/3 then inpatient rehab 9/3-9/23.PMH: CHF, HTN, hyperlipidemia, glomerulonephritis, pancytopenia/chronic anemia, seizure left-sided post stroke    Currently in Pain?  No/denies                       [facility] Adult PT Treatment/Exercise - 05/14/19 1148      Ambulation/Gait   Ambulation/Gait  Yes    Ambulation/Gait Assistance  5: Supervision    Ambulation/Gait Assistance Details  Verbal cues to try to relax left arm for more arm swing.    Ambulation Distance (Feet)  230 Feet    Assistive device  Straight cane    Gait Pattern  Step-through pattern;Decreased arm swing - left    Ambulation Surface  Level;Indoor    Gait Comments  Pt ambulated for 10 min straight with activities  with SPC: gait with carrying cup of water in left hand 115', gait with tossing tennis ball up and down in left hand 115', gait with encouraging large steps with having patient name animals starting with different letter/land animals/count by 3s x 115, marching gait x 115'.  PT provided CGA throughout. Pt had decreased speed with cognitive tasks and dual tasking. Good safety awareness not lunging for tennis ball when he would drop it. Pt had only 1 LOB requiring min assist to regain.      Neuro Re-ed    Neuro Re-ed Details   Gait over obstacle course with SPC walking over 2 mats, up/down 1 step stepping up with left  leg and weaving in and out of 6 cones. CGA for safety. Performed x 3. Pt had 1 episode of catching left heel on step when stepping down. Advised to go slower and improved on 2nd attempt. Standing in // bars on airex with feet together: eyes open x 30 sec, eyes closed 30 sec x 2 with increased sway, tossing ball up and down x 10 each hand, reaching across with UE in D1 diagonals for rotation x 10 each side.      Knee/Hip Exercises: Aerobic   Nustep  level 6 x 6 min. Pt needed to readjust left foot once during. BP=156/82 after.             PT Education - 05/14/19 1257    Education Details  Pt to continue with current HEP    Person(s) Educated  Patient;Spouse    Methods  Explanation    Comprehension  Verbalized understanding       PT Short Term Goals - 05/04/19 2203      PT SHORT TERM GOAL #1   Title  Pt will be independent with progressive HEP for strength and balance to continue gains on own.    Baseline  Pt reports that he has been working on exercises as instructed.    Status  Achieved      PT SHORT TERM GOAL #2   Title  Pt will increase gait speed from 0.61ms to >0.884m with SPC for improved gait safety.    Baseline  0.5768mcomfortable and 0.84m84mast with SPC on 04/28/2019    Status  Partially Met      PT SHORT TERM GOAL #3   Title  Pt will increase Berg Balance from 41/56 to >45/56 for improved balance and decreased fall risk.    Baseline  05/04/19: 48/56 scored today    Status  Achieved    Target Date  05/02/19      PT SHORT TERM GOAL #4   Title  Pt will ambulate >400' with SPC on level surfaces supervision for improved mobility.    Baseline  supervision with SPC on level surfaces.    Status  Achieved    Target Date  05/02/19      PT SHORT TERM GOAL #5   Title  Pt will be able to perform sit to stand without UE support from standard chair consistently for improved functional strength.    Baseline  Pt still needs UE support    Status  Not Met        PT Long  Term Goals - 04/02/19 1221      PT LONG TERM GOAL #1   Title  Pt will ambulate > 500' on varied surfaces with SPC mod I for improved community mobility.    Time  8    Period  Weeks    Status  New    Target Date  06/01/19      PT LONG TERM GOAL #2   Title  Pt will increase Berg from 41/56 to > 48/56 for improved balance and decreased fall risk.    Time  8    Period  Weeks    Status  New    Target Date  06/01/19      PT LONG TERM GOAL #3   Title  Pt will increase 30 sec sit to stand from 7 reps from standard chair with arms to 10 or more for improved functional strength.    Time  8    Period  Weeks    Status  New    Target Date  06/01/19      PT LONG TERM GOAL #4   Title  Pt will decrease TUG from 16 sec with SPC to <14 sec for improved balance and functional mobility.    Time  8    Period  Weeks    Status  New    Target Date  06/01/19            Plan - 05/14/19 1257    Clinical Impression Statement  PT incorporated more dual task and cognitive activities with gait today. Slower speed with these activitites but patient showing good safety awareness.    Personal Factors and Comorbidities  Comorbidity 3+    Comorbidities  CHF, HTN, hyperlipidemia, glomerulonephritis, pancytopenia/chronic anemia, seizure    Examination-Activity Limitations  Stairs;Stand;Squat;Locomotion Level;Transfers;Bed Mobility    Examination-Participation Restrictions  Community Activity;Shop;Driving;Cleaning;Yard Work    Merchant [employer] officer  Evolving/Moderate complexity    Rehab Potential  Good    PT Frequency  2x / week    PT Duration  8 weeks    PT Treatment/Interventions  DME Instruction;ADLs/Self Care Home Management;Gait training;Stair training;Functional mobility training;Therapeutic activities;Therapeutic exercise;Balance training;Orthotic Fit/Training;Patient/family education;Neuromuscular re-education;Wheelchair mobility training;Passive range of motion;Manual techniques    PT  Next Visit Plan  Continue gait with SPC adding in dynamic gait activities (head turns, obstacles). Standing balance and strengthening to left LE.    PT Home Exercise Plan  Access Code: [email or web address]    Consulted and Agree with Plan of Care  Patient    Family Member Consulted  wife       Patient will benefit from skilled therapeutic intervention in order to improve the following deficits and impairments:  Abnormal gait, Difficulty walking, Decreased balance, Decreased mobility, Decreased strength, Decreased knowledge of use of DME, Decreased endurance, Decreased activity tolerance, Decreased range of motion, Pain, Increased muscle spasms  Visit Diagnosis: Muscle weakness (generalized)  Other abnormalities of gait and mobility     Problem List Patient Active Problem List   Diagnosis Date Noted  . 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 Acute blood loss anemia   . Steroid-induced hyperglycemia   . Acute on chronic anemia   . Seizures ([place])   . Spastic hemiparesis ([place])   . 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 Hypertension   . Thrombocytopenia ([place])   . Acute unilateral cerebral infarction in a watershed distribution [facility]) 01/15/2019  . Weakness   . CVA (cerebral vascular accident) ([place]) 01/12/2019  . Malnutrition of moderate degree 07/31/2018  . Elevated serum protein level   . Multiple myeloma ([place])   . Pancytopenia ([place])   . Normocytic normochromic anemia 07/16/2018  . Acute kidney failure ([place]) 07/16/2018  . NICM (nonischemic cardiomyopathy) ([place]) 11/12/2013  . History of ETOH abuse 11/12/2013  . Chronic  combined systolic and diastolic CHF (congestive heart failure) ([place]) 11/12/2013  . Congestive dilated cardiomyopathy ([place]) 11/04/2013  . Malignant hypertension     Electa Sniff, PT, DPT, NCS 05/14/2019, 12:59 PM  [place] [address] [place], [place], [address] Phone: [phone]   Fax:  [phone]  Name: Jeremy Grant MRN: [id number] Date of Birth: Feb 24, 1961

## 2019-05-19 ENCOUNTER — Ambulatory Visit: Payer: BC Managed Care – PPO | Admitting: Occupational Therapy

## 2019-05-19 ENCOUNTER — Encounter: Payer: Self-pay | Admitting: Occupational Therapy

## 2019-05-19 ENCOUNTER — Other Ambulatory Visit: Payer: Self-pay

## 2019-05-19 ENCOUNTER — Ambulatory Visit: Payer: BC Managed Care – PPO | Attending: Physical Medicine & Rehabilitation

## 2019-05-19 DIAGNOSIS — I69354 Hemiplegia and hemiparesis following cerebral infarction affecting left non-dominant side: Secondary | ICD-10-CM

## 2019-05-19 DIAGNOSIS — M6281 Muscle weakness (generalized): Secondary | ICD-10-CM

## 2019-05-19 DIAGNOSIS — R2681 Unsteadiness on feet: Secondary | ICD-10-CM | POA: Insufficient documentation

## 2019-05-19 DIAGNOSIS — R2689 Other abnormalities of gait and mobility: Secondary | ICD-10-CM | POA: Insufficient documentation

## 2019-05-19 NOTE — Therapy (Signed)
[facility] [address] [place], [place], [address] Phone: [phone]   Fax:  [phone]  Occupational Therapy Treatment  Patient Details  Name: Jeremy Grant MRN: [id number] Date of Birth: May 06, 1961 No data recorded  Encounter Date: 05/19/2019  OT End of Session - 05/19/19 0808    Visit Number  11    Number of Visits  13    Date for OT Re-Evaluation  05/24/19    Authorization Type  BC/BS--[place] deductible and oop met in full, prior auth required    OT Start Time  0804    OT Stop Time  0844    OT Time Calculation (min)  40 min    Activity Tolerance  Patient tolerated treatment well    Behavior During Therapy  [facility] for tasks assessed/performed       Past Medical History:  Diagnosis Date  . Arthritis   . CHF (congestive heart failure) ([place])   . Combined systolic and diastolic cardiac dysfunction    Echo 11/03/2013 EF 74%, grade 3 diastolic dysfunction  . Hypertension   . NICM (nonischemic cardiomyopathy) ([place])    a. L/RHC (11/05/13): RA: 3, RV 52/5, PA 49/19 (31), PCWP 10, AO 166/93, PA 67%, Fick CO/CI: 5.71/2.97, Lmain: normal, LAD: large, without signficant dz, first diagonal has 20% dz at ostium, LCx: normal, RCA: 30% stenosis at the bifurcation of PDA and PLOM    Past Surgical History:  Procedure Laterality Date  . BIOPSY  01/31/2019   Procedure: BIOPSY;  Surgeon: Otis Brace, MD;  Location: [facility] ENDOSCOPY;  Service: Gastroenterology;;  . ESOPHAGOGASTRODUODENOSCOPY N/A 01/31/2019   Procedure: ESOPHAGOGASTRODUODENOSCOPY (EGD);  Surgeon: Otis Brace, MD;  Location: [facility] ENDOSCOPY;  Service: Gastroenterology;  Laterality: N/A;  . IR FLUORO GUIDE CV LINE RIGHT  07/25/2018  . IR US GUIDE VASC ACCESS RIGHT  07/25/2018  . LEFT AND RIGHT HEART CATHETERIZATION WITH CORONARY ANGIOGRAM N/A 11/05/2013   Procedure: LEFT AND RIGHT HEART CATHETERIZATION WITH CORONARY ANGIOGRAM;  Surgeon: Peter M Martinique, MD;   Location[facility] CATH LAB;  Service: Cardiovascular;  Laterality: N/A;  . RIGHT HEART CATHETERIZATION N/A 01/14/2014   Procedure: RIGHT HEART CATH;  Surgeon: Larey Dresser, MD;  Location: [facility] CATH LAB;  Service: Cardiovascular;  Laterality: N/A;    There were no vitals filed for this visit.  Subjective Assessment - 05/19/19 0803    Subjective   been doing exercises everyday, walking some at home with cane or without device    Patient is accompanied by:  Family member    Pertinent History  watershed infarcts 01/12/19 including corpus collosum, seizures, chronic anemia. PMH: acute kidney failure March 2020, Shingles in May 2020, CHF, HTN, CKD(IV), HLD, auto immune disease    Limitations  seizures (on meds now - has not had any since d/c from hospital)    Currently in Pain?  No/denies         Supine: performed closed-chain shoulder flex and abduction/diagonal with BUEs with ball for stretch, min v.c.  Followed by LUE shoulder flexion with 3 lb weight AROM with cues to prevent compensations.   In standing, functional reaching  to mid-high range with LUE with 1lb wt on wrist  to place/remove clothespins on vertical pole for incr balance/activity tolerance for IADLs, incorporating wt. Shift to both sides and trunk rotation with UE support prn.  Then functional reaching to  Place/remove small pegs in vertical pegboard for incr activity tolerance and balance (without UE support, but walker available and  close supervision).    Quadruped with forward/backward wt. And alternating UE lifts Shift for incr scapula stability and posture with min facilitation/cues for elbow ext and alignment.  Tall kneeling, AAROM shoulder flex with ball on mat with min v.c..    Standing: BUE AA/ROM w/ ball along wall, with min cueing provided to scapula followed by wall push ups with min cueing for compensations/avoid abduction.   Arm bike x70mn level 5 for reciprocal movement and conditioning without rest.    OT Short  Term Goals - 05/04/19 1021      OT SHORT TERM GOAL #1   Title  Independent with LUE HEP    Time  3    Period  Weeks    Status  Achieved      OT SHORT TERM GOAL #2   Title  Grip strength Lt hand to be 60 lbs or greater    Baseline  Lt = 58 lbs (Rt = 64 lbs)    Time  3    Period  Weeks    Status  Achieved   05/04/19 = 64 lbs     OT SHORT TERM GOAL #3   Title  Pt to stand to fold clothes and wash dishes x 10 min w/o rest    Time  3    Period  Weeks    Status  Partially Met   standing to make coffee, snack     OT SHORT TERM GOAL #4   Title  Pt to consistently go into kitchen and make simple snack/sandwich at distant sup level    Time  3    Period  Weeks    Status  Achieved   05/01/19       OT Long Term Goals - 05/14/19 1144      OT LONG TERM GOAL #1   Title  Independent with updated HEP    Time  6    Period  Weeks    Status  Achieved      OT LONG TERM GOAL #2   Title  Pt able to place 3 lb object on high level shelf LUE 5/5 trials w/ min compensations and no pain    Time  6    Period  Weeks    Status  On-going      OT LONG TERM GOAL #3   Title  Pt to make simple breakfast mod I level safely using DME prn and fall prevention techniques    Time  6    Period  Weeks    Status  Achieved            Plan - 05/19/19 06644   Clinical Impression Statement  Pt progressing well towards goals and LUE strength w/ cues needed for positioning. Pt improving w/ functional balance and wt shifts.    Occupational performance deficits (Please refer to evaluation for details):  IADL's;Leisure;Social Participation;Work    BMarketing executive/ Function / Physical Skills  ADL;IADL;Endurance;Body mechanics;Mobility;Strength;Coordination;UE functional use;Pain    Rehab Potential  Good    OT Frequency  2x / week    OT Duration  6 weeks    OT Treatment/Interventions  Self-care/ADL training;Therapeutic exercise;Functional Mobility Training;Neuromuscular education;Energy  conservation;Therapeutic activities;Coping strategies training;DME and/or AE instruction;Passive range of motion;Patient/family education;Moist Heat    Plan  check remaining goals    Consulted and Agree with Plan of Care  Patient;Family member/caregiver    Family Member Consulted  wife       Patient will benefit  from skilled therapeutic intervention in order to improve the following deficits and impairments:   Body Structure / Function / Physical Skills: ADL, IADL, Endurance, Body mechanics, Mobility, Strength, Coordination, UE functional use, Pain       Visit Diagnosis: Hemiplegia and hemiparesis following cerebral infarction affecting left non-dominant side (HCC)  Muscle weakness (generalized)  Other abnormalities of gait and mobility  Unsteadiness on feet    Problem List Patient Active Problem List   Diagnosis Date Noted  . 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 Acute blood loss anemia   . Steroid-induced hyperglycemia   . Acute on chronic anemia   . Seizures ([place])   . Spastic hemiparesis ([place])   . 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 Hypertension   . Thrombocytopenia ([place])   . Acute unilateral cerebral infarction in a watershed distribution [facility]) 01/15/2019  . Weakness   . CVA (cerebral vascular accident) ([place]) 01/12/2019  . Malnutrition of moderate degree 07/31/2018  . Elevated serum protein level   . Multiple myeloma ([place])   . Pancytopenia ([place])   . Normocytic normochromic anemia 07/16/2018  . Acute kidney failure ([place]) 07/16/2018  . NICM (nonischemic cardiomyopathy) ([place]) 11/12/2013  . History of ETOH abuse 11/12/2013  . Chronic combined systolic and diastolic CHF (congestive heart failure) ([place]) 11/12/2013  . Congestive dilated cardiomyopathy ([place]) 11/04/2013  . Malignant hypertension     [facility] 05/19/2019, 8:09 AM  [facility] [address] [place], [place], [address] Phone: [phone]   Fax:   [phone]  Name: Jeremy Grant MRN: [id number] Date of Birth: 1960-08-12   Vianne Bulls, OTR/L [facility] [address] [place], [place]  [address] [phone] phone [phone] 05/19/19 8:53 AM

## 2019-05-19 NOTE — Therapy (Signed)
[facility] [address] [place], [place], [address] Phone: [phone]   Fax:  [phone]  Physical Therapy Treatment  Patient Details  Name: Jeremy Grant MRN: [id number] Date of Birth: 04-Sep-1960 Referring Provider (PT): Dr. Letta Pate   Encounter Date: 05/19/2019  PT End of Session - 05/19/19 0848    Visit Number  24    Number of Visits  32    Date for PT Re-Evaluation  06/11/19    Authorization Type  BCBS    PT Start Time  0847    PT Stop Time  0928    PT Time Calculation (min)  41 min    Equipment Utilized During Treatment  Gait belt   left AFO   Activity Tolerance  Patient tolerated treatment well    Behavior During Therapy  [facility] for tasks assessed/performed       Past Medical History:  Diagnosis Date  . Arthritis   . CHF (congestive heart failure) ([place])   . Combined systolic and diastolic cardiac dysfunction    Echo 11/03/2013 EF 93%, grade 3 diastolic dysfunction  . Hypertension   . NICM (nonischemic cardiomyopathy) ([place])    a. L/RHC (11/05/13): RA: 3, RV 52/5, PA 49/19 (31), PCWP 10, AO 166/93, PA 67%, Fick CO/CI: 5.71/2.97, Lmain: normal, LAD: large, without signficant dz, first diagonal has 20% dz at ostium, LCx: normal, RCA: 30% stenosis at the bifurcation of PDA and PLOM    Past Surgical History:  Procedure Laterality Date  . BIOPSY  01/31/2019   Procedure: BIOPSY;  Surgeon: Otis Brace, MD;  Location: [facility] ENDOSCOPY;  Service: Gastroenterology;;  . ESOPHAGOGASTRODUODENOSCOPY N/A 01/31/2019   Procedure: ESOPHAGOGASTRODUODENOSCOPY (EGD);  Surgeon: Otis Brace, MD;  Location: [facility] ENDOSCOPY;  Service: Gastroenterology;  Laterality: N/A;  . IR FLUORO GUIDE CV LINE RIGHT  07/25/2018  . IR US GUIDE VASC ACCESS RIGHT  07/25/2018  . LEFT AND RIGHT HEART CATHETERIZATION WITH CORONARY ANGIOGRAM N/A 11/05/2013   Procedure: LEFT AND RIGHT HEART CATHETERIZATION WITH CORONARY ANGIOGRAM;  Surgeon: Peter M  Martinique, MD;  Location: [facility] CATH LAB;  Service: Cardiovascular;  Laterality: N/A;  . RIGHT HEART CATHETERIZATION N/A 01/14/2014   Procedure: RIGHT HEART CATH;  Surgeon: Larey Dresser, MD;  Location: [facility] CATH LAB;  Service: Cardiovascular;  Laterality: N/A;    There were no vitals filed for this visit.  Subjective Assessment - 05/19/19 0848    Subjective  Pt reports he is doing well.    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 8/31-9/3 then inpatient rehab 9/3-9/23.PMH: CHF, HTN, hyperlipidemia, glomerulonephritis, pancytopenia/chronic anemia, seizure left-sided post stroke    Currently in Pain?  No/denies                       [facility] Adult PT Treatment/Exercise - 05/19/19 0925      Transfers   Transfers  Sit to Stand;Stand to Sit    Sit to Stand  6: Modified independent (Device/Increase time)    Five time sit to stand comments   Pt performed sit to stand x 5 from mat without UE support. Needed multiple attempts at times to rise with verbal cues to lean forward without any UE support.      Ambulation/Gait   Ambulation/Gait  Yes    Ambulation/Gait Assistance  5: Supervision    Ambulation Distance (Feet)  300 Feet    Assistive device  Straight cane  Gait Pattern  Step-through pattern    Ambulation Surface  Level;Indoor    Ramp  5: Supervision    Ramp Details (indicate cue type and reason)  performed x 3 with SPC    Curb  5: Supervision;4: Min assist    Curb Details (indicate cue type and reason)  x 3 with SPC with verbal cues for sequencing and to watch clearance of left trailing foot.    Gait Comments  Pt ambulated at end of session 120' more with head turns left/right and up/down for 19' each CGA with 1 LOB to the left.      Neuro Re-ed    Neuro Re-ed Details   In // bars: marching gait forwards then large steps backwards x 2 laps with 1 UE support, marching gait on mat 6' x 4  without UE support with close SBA and verbal cues to take his time and control weight shift. SLS left 20 sec x 2 with minimal fingertip support on right UE.      Exercises   Exercises  Other Exercises    Other Exercises   Step-up on 8" step with right LE x 2 with right UE support working on left foot clearance when trailing then performed step-ups 5 x 2 with left leg with bilateral UE support and verbal cues to shift over left leg more. Pt with more flexed posture with higher step. Pt performed step-ups on airex x 10 with light right UE support then x 10 with SPC support. Step-ups on 4" step with airex on top with left LE with 1 UE support x 10. Mini-squats on mat  with UE support 10 x 2 with verbal cues for form.             PT Education - 05/19/19 4742    Education Details  Pt to continue with current HEP. Discussed walking more at home with cane with wife with him and when on own using walker but as little support through arms as he can.    Person(s) Educated  Patient;Spouse    Methods  Explanation    Comprehension  Verbalized understanding       PT Short Term Goals - 05/04/19 2203      PT SHORT TERM GOAL #1   Title  Pt will be independent with progressive HEP for strength and balance to continue gains on own.    Baseline  Pt reports that he has been working on exercises as instructed.    Status  Achieved      PT SHORT TERM GOAL #2   Title  Pt will increase gait speed from 0.1ms to >0.8110m with SPC for improved gait safety.    Baseline  0.5717mcomfortable and 0.43m76mast with SPC on 04/28/2019    Status  Partially Met      PT SHORT TERM GOAL #3   Title  Pt will increase Berg Balance from 41/56 to >45/56 for improved balance and decreased fall risk.    Baseline  05/04/19: 48/56 scored today    Status  Achieved    Target Date  05/02/19      PT SHORT TERM GOAL #4   Title  Pt will ambulate >400' with SPC on level surfaces supervision for improved mobility.    Baseline   supervision with SPC on level surfaces.    Status  Achieved    Target Date  05/02/19      PT SHORT TERM GOAL #5   Title  Pt  will be able to perform sit to stand without UE support from standard chair consistently for improved functional strength.    Baseline  Pt still needs UE support    Status  Not Met        PT Long Term Goals - 04/02/19 1221      PT LONG TERM GOAL #1   Title  Pt will ambulate > 500' on varied surfaces with SPC mod I for improved community mobility.    Time  8    Period  Weeks    Status  New    Target Date  06/01/19      PT LONG TERM GOAL #2   Title  Pt will increase Berg from 41/56 to > 48/56 for improved balance and decreased fall risk.    Time  8    Period  Weeks    Status  New    Target Date  06/01/19      PT LONG TERM GOAL #3   Title  Pt will increase 30 sec sit to stand from 7 reps from standard chair with arms to 10 or more for improved functional strength.    Time  8    Period  Weeks    Status  New    Target Date  06/01/19      PT LONG TERM GOAL #4   Title  Pt will decrease TUG from 16 sec with SPC to <14 sec for improved balance and functional mobility.    Time  8    Period  Weeks    Status  New    Target Date  06/01/19            Plan - 05/19/19 0940    Clinical Impression Statement  Pt was able to rise from mat without UE support for first time today but needed multiple attempts at times. Pt continues to show improving activity tolerance and strength with activities with less breaks and better left knee control.    Personal Factors and Comorbidities  Comorbidity 3+    Comorbidities  CHF, HTN, hyperlipidemia, glomerulonephritis, pancytopenia/chronic anemia, seizure    Examination-Activity Limitations  Stairs;Stand;Squat;Locomotion Level;Transfers;Bed Mobility    Examination-Participation Restrictions  Community Activity;Shop;Driving;Cleaning;Yard Work    [employer] officer  Evolving/Moderate complexity    Rehab  Potential  Good    PT Frequency  2x / week    PT Duration  8 weeks    PT Treatment/Interventions  DME Instruction;ADLs/Self Care Home Management;Gait training;Stair training;Functional mobility training;Therapeutic activities;Therapeutic exercise;Balance training;Orthotic Fit/Training;Patient/family education;Neuromuscular re-education;Wheelchair mobility training;Passive range of motion;Manual techniques    PT Next Visit Plan  Continue gait with SPC adding in dynamic gait activities (head turns, obstacles). Standing balance and strengthening to left LE.    PT Home Exercise Plan  Access Code: [email or web address]    Consulted and Agree with Plan of Care  Patient    Family Member Consulted  wife       Patient will benefit from skilled therapeutic intervention in order to improve the following deficits and impairments:  Abnormal gait, Difficulty walking, Decreased balance, Decreased mobility, Decreased strength, Decreased knowledge of use of DME, Decreased endurance, Decreased activity tolerance, Decreased range of motion, Pain, Increased muscle spasms  Visit Diagnosis: Muscle weakness (generalized)  Other abnormalities of gait and mobility     Problem List Patient Active Problem List   Diagnosis Date Noted  . 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 Acute blood loss anemia   . Steroid-induced hyperglycemia   . Acute on  chronic anemia   . Seizures ([place])   . Spastic hemiparesis ([place])   . 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 Hypertension   . Thrombocytopenia ([place])   . Acute unilateral cerebral infarction in a watershed distribution [facility]) 01/15/2019  . Weakness   . CVA (cerebral vascular accident) ([place]) 01/12/2019  . Malnutrition of moderate degree 07/31/2018  . Elevated serum protein level   . Multiple myeloma ([place])   . Pancytopenia ([place])   . Normocytic normochromic anemia 07/16/2018  . Acute kidney failure ([place]) 07/16/2018  . NICM (nonischemic cardiomyopathy) ([place]) 11/12/2013  . History of ETOH abuse  11/12/2013  . Chronic combined systolic and diastolic CHF (congestive heart failure) ([place]) 11/12/2013  . Congestive dilated cardiomyopathy ([place]) 11/04/2013  . Malignant hypertension     Electa Sniff, PT, DPT, NCS 05/19/2019, 9:41 AM  [facility] [address] [place], [place], [address] Phone: [phone]   Fax:  [phone]  Name: Jeremy Grant MRN: [id number] Date of Birth: 23-Jul-1960

## 2019-05-21 ENCOUNTER — Ambulatory Visit: Payer: BC Managed Care – PPO | Admitting: Occupational Therapy

## 2019-05-21 ENCOUNTER — Other Ambulatory Visit: Payer: Self-pay

## 2019-05-21 ENCOUNTER — Ambulatory Visit: Payer: BC Managed Care – PPO

## 2019-05-21 DIAGNOSIS — R2689 Other abnormalities of gait and mobility: Secondary | ICD-10-CM

## 2019-05-21 DIAGNOSIS — M6281 Muscle weakness (generalized): Secondary | ICD-10-CM | POA: Diagnosis not present

## 2019-05-21 NOTE — Therapy (Signed)
[facility] [address] [place], [place], [address] Phone: [phone]   Fax:  [phone]  Physical Therapy Treatment  Patient Details  Name: Jeremy Grant MRN: [id number] Date of Birth: January 08, 1961 Referring Provider (PT): Dr. Letta Pate   Encounter Date: 05/21/2019  PT End of Session - 05/21/19 0939    Visit Number  25    Number of Visits  32    Date for PT Re-Evaluation  06/11/19    Authorization Type  BCBS    PT Start Time  0935    PT Stop Time  1017    PT Time Calculation (min)  42 min    Equipment Utilized During Treatment  Gait belt   left AFO   Activity Tolerance  Patient tolerated treatment well    Behavior During Therapy  [facility] for tasks assessed/performed       Past Medical History:  Diagnosis Date  . Arthritis   . CHF (congestive heart failure) ([place])   . Combined systolic and diastolic cardiac dysfunction    Echo 11/03/2013 EF 32%, grade 3 diastolic dysfunction  . Hypertension   . NICM (nonischemic cardiomyopathy) ([place])    a. L/RHC (11/05/13): RA: 3, RV 52/5, PA 49/19 (31), PCWP 10, AO 166/93, PA 67%, Fick CO/CI: 5.71/2.97, Lmain: normal, LAD: large, without signficant dz, first diagonal has 20% dz at ostium, LCx: normal, RCA: 30% stenosis at the bifurcation of PDA and PLOM    Past Surgical History:  Procedure Laterality Date  . BIOPSY  01/31/2019   Procedure: BIOPSY;  Surgeon: Otis Brace, MD;  Location: [facility] ENDOSCOPY;  Service: Gastroenterology;;  . ESOPHAGOGASTRODUODENOSCOPY N/A 01/31/2019   Procedure: ESOPHAGOGASTRODUODENOSCOPY (EGD);  Surgeon: Otis Brace, MD;  Location[facility] ENDOSCOPY;  Service: Gastroenterology;  Laterality: N/A;  . IR FLUORO GUIDE CV LINE RIGHT  07/25/2018  . IR US GUIDE VASC ACCESS RIGHT  07/25/2018  . LEFT AND RIGHT HEART CATHETERIZATION WITH CORONARY ANGIOGRAM N/A 11/05/2013   Procedure: LEFT AND RIGHT HEART CATHETERIZATION WITH CORONARY ANGIOGRAM;  Surgeon: Peter M  Martinique, MD;  Location: [facility] CATH LAB;  Service: Cardiovascular;  Laterality: N/A;  . RIGHT HEART CATHETERIZATION N/A 01/14/2014   Procedure: RIGHT HEART CATH;  Surgeon: Larey Dresser, MD;  Location[facility] CATH LAB;  Service: Cardiovascular;  Laterality: N/A;    There were no vitals filed for this visit.  Subjective Assessment - 05/21/19 0939    Subjective  Pt reports he is doing well. He walked outside with cane with wife to barn yesterday.    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 8/31-9/3 then inpatient rehab 9/3-9/23.PMH: CHF, HTN, hyperlipidemia, glomerulonephritis, pancytopenia/chronic anemia, seizure left-sided post stroke                       [facility] Adult PT Treatment/Exercise - 05/21/19 0940      Ambulation/Gait   Ambulation/Gait  Yes    Ambulation/Gait Assistance  5: Supervision;4: Min guard    Ambulation/Gait Assistance Details  Verbal cues to increase left foot clearance.    Ambulation Distance (Feet)  800 Feet    Assistive device  Straight cane    Gait Pattern  Step-through pattern    Ambulation Surface  Level;Indoor    Gait Comments  Pt peformed dynamic gait activities during gait: marching, large steps, head turns left/right, eyes closed walking, backwards walking. Pt needed CGA with dynamic components added in. Was challenged  greatly with eyes closed and was very unstable and staggering more unable to keep straight path.      Neuro Re-ed    Neuro Re-ed Details   Gait over 5 hurdles x 6 with [facility] CGA/min assist. Pt tripped on hurdles a couple times but overall did well with increasing left hip flexion. Pt always led with left foot. Standing on rockerboard maintaining level: 30 sec eyes open, 30 sec eyes closed but losing balance posterior with eyes closed frequently needing min assist, standing with arm movement overhead/out to side/across chest x 10 CGA, bilateral scapular  retraction 10 x 2 with wife holding red theraband and therapist guarding for safety as well as verbal and tactile cues for form. Gait at end of session with manual pertubation x 230'  with SPC CGA.             PT Education - 05/21/19 1155    Education Details  Added standing scapular retraction to HEP for wife to assist with.    Person(s) Educated  Patient;Spouse    Methods  Explanation;Demonstration;Handout    Comprehension  Verbalized understanding;Returned demonstration       PT Short Term Goals - 05/04/19 2203      PT SHORT TERM GOAL #1   Title  Pt will be independent with progressive HEP for strength and balance to continue gains on own.    Baseline  Pt reports that he has been working on exercises as instructed.    Status  Achieved      PT SHORT TERM GOAL #2   Title  Pt will increase gait speed from 0.57ms to >0.848m with SPC for improved gait safety.    Baseline  0.5748mcomfortable and 0.55m55mast with SPC on 04/28/2019    Status  Partially Met      PT SHORT TERM GOAL #3   Title  Pt will increase Berg Balance from 41/56 to >45/56 for improved balance and decreased fall risk.    Baseline  05/04/19: 48/56 scored today    Status  Achieved    Target Date  05/02/19      PT SHORT TERM GOAL #4   Title  Pt will ambulate >400' with SPC on level surfaces supervision for improved mobility.    Baseline  supervision with SPC on level surfaces.    Status  Achieved    Target Date  05/02/19      PT SHORT TERM GOAL #5   Title  Pt will be able to perform sit to stand without UE support from standard chair consistently for improved functional strength.    Baseline  Pt still needs UE support    Status  Not Met        PT Long Term Goals - 04/02/19 1221      PT LONG TERM GOAL #1   Title  Pt will ambulate > 500' on varied surfaces with SPC mod I for improved community mobility.    Time  8    Period  Weeks    Status  New    Target Date  06/01/19      PT LONG TERM GOAL #2    Title  Pt will increase Berg from 41/56 to > 48/56 for improved balance and decreased fall risk.    Time  8    Period  Weeks    Status  New    Target Date  06/01/19      PT LONG TERM GOAL #3   Title  Pt will increase 30 sec sit to stand from 7 reps from standard chair with arms to 10 or more for improved functional strength.    Time  8    Period  Weeks    Status  New    Target Date  06/01/19      PT LONG TERM GOAL #4   Title  Pt will decrease TUG from 16 sec with SPC to <14 sec for improved balance and functional mobility.    Time  8    Period  Weeks    Status  New    Target Date  06/01/19            Plan - 05/21/19 1200    Clinical Impression Statement  Pt continues to progress gait with SPC. He relies heavily on vision for balance and challenged when removed.    Personal Factors and Comorbidities  Comorbidity 3+    Comorbidities  CHF, HTN, hyperlipidemia, glomerulonephritis, pancytopenia/chronic anemia, seizure    Examination-Activity Limitations  Stairs;Stand;Squat;Locomotion Level;Transfers;Bed Mobility    Examination-Participation Restrictions  Community Activity;Shop;Driving;Cleaning;Yard Work    [employer]chant [employer] officer  Evolving/Moderate complexity    Rehab Potential  Good    PT Frequency  2x / week    PT Duration  8 weeks    PT Treatment/Interventions  DME Instruction;ADLs/Self Care Home Management;Gait training;Stair training;Functional mobility training;Therapeutic activities;Therapeutic exercise;Balance training;Orthotic Fit/Training;Patient/family education;Neuromuscular re-education;Wheelchair mobility training;Passive range of motion;Manual techniques    PT Next Visit Plan  Continue gait with SPC adding in dynamic gait activities (head turns, obstacles). Standing balance and strengthening to left LE.    PT Home Exercise Plan  Access Code: [email or web address]    Consulted and Agree with Plan of Care  Patient    Family Member Consulted  wife       Patient  will benefit from skilled therapeutic intervention in order to improve the following deficits and impairments:  Abnormal gait, Difficulty walking, Decreased balance, Decreased mobility, Decreased strength, Decreased knowledge of use of DME, Decreased endurance, Decreased activity tolerance, Decreased range of motion, Pain, Increased muscle spasms  Visit Diagnosis: Muscle weakness (generalized)  Other abnormalities of gait and mobility     Problem List Patient Active Problem List   Diagnosis Date Noted  . 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 Acute blood loss anemia   . Steroid-induced hyperglycemia   . Acute on chronic anemia   . Seizures ([place])   . Spastic hemiparesis ([place])   . 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 Hypertension   . Thrombocytopenia ([place])   . Acute unilateral cerebral infarction in a watershed distribution [facility]) 01/15/2019  . Weakness   . CVA (cerebral vascular accident) ([place]) 01/12/2019  . Malnutrition of moderate degree 07/31/2018  . Elevated serum protein level   . Multiple myeloma ([place])   . Pancytopenia ([place])   . Normocytic normochromic anemia 07/16/2018  . Acute kidney failure ([place]) 07/16/2018  . NICM (nonischemic cardiomyopathy) ([place]) 11/12/2013  . History of ETOH abuse 11/12/2013  . Chronic combined systolic and diastolic CHF (congestive heart failure) ([place]) 11/12/2013  . Congestive dilated cardiomyopathy ([place]) 11/04/2013  . Malignant hypertension     Electa Sniff, PT, DPT, NCS 05/21/2019, 12:03 PM  [facility] [address] [place], [place], [address] Phone: [phone]   Fax:  [phone]  Name: Jeremy Grant MRN: [id number] Date of Birth: [birth year]

## 2019-05-21 NOTE — Patient Instructions (Signed)
Access Code: [email or web address]  URL: [email or web address]  Date: 05/21/2019  Prepared by: Cherly Anderson   Exercises Seated Hamstring Stretch with Strap - 4 reps - 1 sets - 1 min hold - 3x daily - 7x weekly Supine Short Arc Quad - 10 reps - 1 sets - 2x daily - 7x weekly Supine Bridge with Pelvic Floor Contraction on Swiss Ball - 10 reps - 1 sets - 2x daily - 7x weekly Supine Ankle Dorsiflexion Stretch with Caregiver - 4 reps - 1 sets - 1 min hold - 3x daily - 7x weekly Clamshell - 5 reps - 2 sets - 1x daily - 7x weekly Seated Knee Flexion - 5 reps - 3 sets - 1x daily - 7x weekly Prone Knee Flexion - 10 reps - 2 sets - 1x daily - 5x weekly Standing with Head Rotation - 3 reps - 1 sets - 10 turns hold - 2x daily - 7x weekly Standing Balance with Eyes Closed - 2 reps - 1 sets - 30 sec hold - 2x daily - 7x weekly Scapular Retraction with Resistance - 10 reps - 3 sets - 1x daily - 7x weekly

## 2019-05-25 ENCOUNTER — Ambulatory Visit (HOSPITAL_COMMUNITY)
Admission: RE | Admit: 2019-05-25 | Discharge: 2019-05-25 | Disposition: A | Discharge status: Home / Self Care | Payer: BC Managed Care – PPO | Source: Ambulatory Visit | Attending: Nephrology | Admitting: Nephrology

## 2019-05-25 ENCOUNTER — Encounter
Payer: BC Managed Care – PPO | Attending: Physical Medicine & Rehabilitation | Admitting: Physical Medicine & Rehabilitation

## 2019-05-25 ENCOUNTER — Other Ambulatory Visit: Payer: Self-pay

## 2019-05-25 ENCOUNTER — Encounter: Payer: Self-pay | Admitting: Physical Medicine & Rehabilitation

## 2019-05-25 VITALS — BP 176/82 | HR 67 | Temp 97.7°F | Ht 73.0 in | Wt 155.8 lb

## 2019-05-25 VITALS — BP 170/89 | HR 65 | Temp 97.3°F | Resp 18

## 2019-05-25 DIAGNOSIS — I10 Essential (primary) hypertension: Secondary | ICD-10-CM | POA: Diagnosis present

## 2019-05-25 DIAGNOSIS — R569 Unspecified convulsions: Secondary | ICD-10-CM | POA: Diagnosis present

## 2019-05-25 DIAGNOSIS — I6389 Other cerebral infarction: Secondary | ICD-10-CM | POA: Diagnosis not present

## 2019-05-25 DIAGNOSIS — G8110 Spastic hemiplegia affecting unspecified side: Secondary | ICD-10-CM

## 2019-05-25 DIAGNOSIS — R252 Cramp and spasm: Secondary | ICD-10-CM

## 2019-05-25 DIAGNOSIS — I5042 Chronic combined systolic (congestive) and diastolic (congestive) heart failure: Secondary | ICD-10-CM | POA: Diagnosis present

## 2019-05-25 DIAGNOSIS — N179 Acute kidney failure, unspecified: Secondary | ICD-10-CM | POA: Insufficient documentation

## 2019-05-25 LAB — POCT HEMOGLOBIN-HEMACUE: Hemoglobin: 10.3 g/dL — ABNORMAL LOW (ref 13.0–17.0)

## 2019-05-25 MED ORDER — EPOETIN ALFA-EPBX 10000 UNIT/ML IJ SOLN
INTRAMUSCULAR | Status: AC
  Administered 2019-05-25: 20000 [IU] via SUBCUTANEOUS
  Filled 2019-05-25: qty 1

## 2019-05-25 MED ORDER — EPOETIN ALFA-EPBX 10000 UNIT/ML IJ SOLN
INTRAMUSCULAR | Status: AC
  Filled 2019-05-25: qty 1

## 2019-05-25 MED ORDER — EPOETIN ALFA-EPBX 10000 UNIT/ML IJ SOLN: 20000.0000 [IU] | INTRAMUSCULAR | Status: DC

## 2019-05-25 NOTE — Progress Notes (Signed)
Subjective:    Patient ID: Jeremy Grant, male    DOB: Sep 21, 1960, 59 y.o.   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   Wife provides history.  Last clinic visit on 04/22/2019.  Since that time, patient has been working with therapies, notes reviewed-progressing with use of single-point cane, but continues to have deficits with balance when challenged and without assistive device. He continues to follow up with Neuro.  His Depakote was decreased due to kidney issues, no reported seizures since that time.  Wife notes good benefit with therapies. BP is elevated, but normally runs better per patient.  BP meds being managed by Nephro.  Weights have been stable. He did not follow up with GI.  He had good benefit with increase in Tizanidine. Denies falls.   Pain Inventory Average Pain 2 Pain Right Now 0 My pain is aching  In the last 24 hours, has pain interfered with the following? General activity 0 Relation with others 0 Enjoyment of life 0 What TIME of day is your pain at its worst? night Sleep (in general) Good  Pain is worse with: . Pain improves with: medication Relief from Meds: .  Mobility use a walker ability to climb steps?  no do you drive?  no use a wheelchair  Function employed # of hrs/week .  Neuro/Psych weakness  Prior Studies Any changes since last visit?  no  Physicians involved in your care Any changes since last visit?  no   Family  History  Problem Relation Age of Onset  . Heart attack Father   . Heart disease Father   . Arrhythmia Father   . Hypertension Brother   . Hypertension Brother    Social History   Socioeconomic History  . Marital status: Married    Spouse name: Jeremy Grant  . Number of children: 0  . Years of education: 57  . Highest education level: 12th grade  Occupational History  . Occupation: Medical sales representative  Tobacco Use  . Smoking status: Never Smoker  . Smokeless tobacco: Never Used  Substance and Sexual Activity  . Alcohol use: Yes    Alcohol/week: 3.0 standard drinks    Types: 3 Glasses of wine per week  . Drug use: No  . Sexual activity: Not Currently    Birth control/protection: None  Other Topics Concern  . Not on file  Social History Narrative  . Not on file   Social Determinants of Health   Financial Resource Strain:   . Difficulty of Paying Living Expenses: Not on file  Food Insecurity: No Food Insecurity  . Worried About Charity fundraiser in the Last Year: Never true  . Ran Out of Food in the Last Year: Never true  Transportation Needs: No Transportation Needs  . Lack of Transportation (Medical): No  . Lack of Transportation (Non-Medical): No  Physical Activity: Sufficiently Active  . Days of Exercise per Week: 5 days  . Minutes of Exercise per Session: 30 min  Stress: No Stress Concern Present  . Feeling of Stress : Not  at all  Social Connections: Somewhat Isolated  . Frequency of Communication with Friends and Family: More than three times a week  . Frequency of Social Gatherings with Friends and Family: Twice a week  . Attends Religious Services: Never  . Active Member of Clubs or Organizations: No  . Attends Archivist Meetings: Never  . Marital Status: Married   Past Surgical History:  Procedure Laterality Date  . BIOPSY  01/31/2019   Procedure: BIOPSY;  Surgeon: Otis Brace, MD;  Location: [facility];  Service: Gastroenterology;;  .  ESOPHAGOGASTRODUODENOSCOPY N/A 01/31/2019   Procedure: ESOPHAGOGASTRODUODENOSCOPY (EGD);  Surgeon: Otis Brace, MD;  Location: [facility] ENDOSCOPY;  Service: Gastroenterology;  Laterality: N/A;  . IR FLUORO GUIDE CV LINE RIGHT  07/25/2018  . IR US GUIDE VASC ACCESS RIGHT  07/25/2018  . LEFT AND RIGHT HEART CATHETERIZATION WITH CORONARY ANGIOGRAM N/A 11/05/2013   Procedure: LEFT AND RIGHT HEART CATHETERIZATION WITH CORONARY ANGIOGRAM;  Surgeon: Peter M Martinique, MD;  Location: [facility] CATH LAB;  Service: Cardiovascular;  Laterality: N/A;  . RIGHT HEART CATHETERIZATION N/A 01/14/2014   Procedure: RIGHT HEART CATH;  Surgeon: Larey Dresser, MD;  Location: [facility] CATH LAB;  Service: Cardiovascular;  Laterality: N/A;   Past Medical History:  Diagnosis Date  . Arthritis   . CHF (congestive heart failure) ([place])   . Combined systolic and diastolic cardiac dysfunction    Echo 11/03/2013 EF 28%, grade 3 diastolic dysfunction  . Hypertension   . NICM (nonischemic cardiomyopathy) ([place])    a. L/RHC (11/05/13): RA: 3, RV 52/5, PA 49/19 (31), PCWP 10, AO 166/93, PA 67%, Fick CO/CI: 5.71/2.97, Lmain: normal, LAD: large, without signficant dz, first diagonal has 20% dz at ostium, LCx: normal, RCA: 30% stenosis at the bifurcation of PDA and PLOM   BP (!) 176/82   Pulse 67   Temp 97.7 F (36.5 C)   Ht 6\' 1"  (1.854 m)   Wt 155 lb 12.8 oz (70.7 kg)   SpO2 98%   BMI 20.56 kg/m   Opioid Risk Score:   Fall Risk Score:  `1  Depression screen PHQ 2/9  Depression screen PHQ 2/9 02/17/2019  Decreased Interest 1  Down, Depressed, Hopeless 1  PHQ - 2 Score 2  Altered sleeping 2  Tired, decreased energy 2  Change in appetite 0  Feeling bad or failure about yourself  1  Trouble concentrating 0  Moving slowly or fidgety/restless 0  Suicidal thoughts 0  PHQ-9 Score 7  Difficult doing work/chores Somewhat difficult     Review of Systems  Constitutional: Negative.   HENT: Negative.   Eyes: Negative.   Respiratory:  Negative.   Cardiovascular: Negative.   Gastrointestinal: Negative.   Endocrine: Negative.   Genitourinary: Negative.   Musculoskeletal: Positive for arthralgias and gait problem.  Skin: Negative.   Allergic/Immunologic: Negative.   Neurological: Positive for dizziness.  Hematological: Negative.   Psychiatric/Behavioral: Negative.       Objective:   Physical Exam  Constitutional: No distress . Vital signs reviewed. HENT: Normocephalic.  Atraumatic. Eyes: EOMI. No discharge. Cardiovascular: No JVD. Respiratory: Normal effort.  No stridor. GI: Non-distended. Skin: Warm and dry.  Intact. Psych: Normal mood.  Normal behavior. Musc: No edema in extremities.  No tenderness in extremities. 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   1.  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    Cont follow up with Neuro  Cont therapies   2.  Hypertension.    Elevated today, however, better controlled at home per patient.  Cont follow up with Nephro for further adjustments as necessary  3.  Pancytopenia/chronic anemia.    Has not followed up with GI, labs being monitored by [facility]  4.  Spasms LLE  LLE             Cont Tizanidine to 4mg  - good benefit  5.   Seizure left-sided post stroke.    Follow up with Neuro  No repeat seizures reported, depakote dose recently adjusted

## 2019-05-26 ENCOUNTER — Ambulatory Visit: Payer: BC Managed Care – PPO

## 2019-05-26 DIAGNOSIS — R2689 Other abnormalities of gait and mobility: Secondary | ICD-10-CM

## 2019-05-26 DIAGNOSIS — M6281 Muscle weakness (generalized): Secondary | ICD-10-CM | POA: Diagnosis not present

## 2019-05-26 NOTE — Therapy (Signed)
[facility] [address] [place], [place], [address] Phone: [phone]   Fax:  [phone]  Physical Therapy Treatment  Patient Details  Name: Jeremy Grant MRN: [id number] Date of Birth: [birth year] Referring Provider (PT): Dr. Letta Pate   Encounter Date: 05/26/2019  PT End of Session - 05/26/19 0941    Visit Number  26    Number of Visits  32    Date for PT Re-Evaluation  06/11/19    Authorization Type  BCBS    PT Start Time  0934    PT Stop Time  1016    PT Time Calculation (min)  42 min    Equipment Utilized During Treatment  Gait belt   left AFO   Activity Tolerance  Patient tolerated treatment well    Behavior During Therapy  [facility] for tasks assessed/performed       Past Medical History:  Diagnosis Date  . Arthritis   . CHF (congestive heart failure) ([place])   . Combined systolic and diastolic cardiac dysfunction    Echo 11/03/2013 EF 42%, grade 3 diastolic dysfunction  . Hypertension   . NICM (nonischemic cardiomyopathy) ([place])    a. L/RHC (11/05/13): RA: 3, RV 52/5, PA 49/19 (31), PCWP 10, AO 166/93, PA 67%, Fick CO/CI: 5.71/2.97, Lmain: normal, LAD: large, without signficant dz, first diagonal has 20% dz at ostium, LCx: normal, RCA: 30% stenosis at the bifurcation of PDA and PLOM    Past Surgical History:  Procedure Laterality Date  . BIOPSY  01/31/2019   Procedure: BIOPSY;  Surgeon: Otis Brace, MD;  Location: [facility]OSCOPY;  Service: Gastroenterology;;  . ESOPHAGOGASTRODUODENOSCOPY N/A 01/31/2019   Procedure: ESOPHAGOGASTRODUODENOSCOPY (EGD);  Surgeon: Otis Brace, MD;  Location: [facility] ENDOSCOPY;  Service: Gastroenterology;  Laterality: N/A;  . IR FLUORO GUIDE CV LINE RIGHT  07/25/2018  . IR US GUIDE VASC ACCESS RIGHT  07/25/2018  . LEFT AND RIGHT HEART CATHETERIZATION WITH CORONARY ANGIOGRAM N/A 11/05/2013   Procedure: LEFT AND RIGHT HEART CATHETERIZATION WITH CORONARY ANGIOGRAM;  Surgeon: Peter M  Martinique, MD;  Location: [facility] CATH LAB;  Service: Cardiovascular;  Laterality: N/A;  . RIGHT HEART CATHETERIZATION N/A 01/14/2014   Procedure: RIGHT HEART CATH;  Surgeon: Larey Dresser, MD;  Location: [facility] CATH LAB;  Service: Cardiovascular;  Laterality: N/A;    There were no vitals filed for this visit.  Subjective Assessment - 05/26/19 0940    Subjective  Pt reports that he continues to do well.    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 8/31-9/3 then inpatient rehab 9/3-9/23.PMH: CHF, HTN, hyperlipidemia, glomerulonephritis, pancytopenia/chronic anemia, seizure left-sided post stroke    Currently in Pain?  No/denies                       [facility] Adult PT Treatment/Exercise - 05/26/19 0941      Ambulation/Gait   Ambulation/Gait  Yes    Ambulation/Gait Assistance  5: Supervision    Ambulation/Gait Assistance Details  Verbal cues for increased step length    Ambulation Distance (Feet)  250 Feet    Assistive device  Straight cane    Gait Pattern  Step-through pattern    Ambulation Surface  Level;Indoor    Stairs  Yes    Stairs Assistance  5: Supervision    Stair Management Technique  One rail Left;Step to pattern   [facility]   Number of Stairs  8  Ramp  5: Supervision    Ramp Details (indicate cue type and reason)  x 3 with SPC    Curb  5: Supervision    Curb Details (indicate cue type and reason)  x 3 with SPC    Gait Comments  Pt also performed stair negotiation with bilateral rail support with reciprocal pattern x 8 steps      Neuro Re-ed    Neuro Re-ed Details   In // bars: gait without UE support 6' x 6, walking over mat 6' x 6, marching in place on mat x 15 bilateral without UE support with verbal cues to weight shift over left more fully.      Exercises   Exercises  Other Exercises    Other Exercises   PT reassessed left ankle ROM, 2-/5 with all motions. Prone left hamstring curl  10 x 2 with PT stabilizing to keep hip in neutral, right sidelying for left clamshell 10 x 2 with partial range.      Knee/Hip Exercises: Aerobic   Nustep  Level 6 x 6 min with bilateral UE and legs             PT Education - 05/26/19 1255    Education Details  Pt to continue with current HEP being sure he is working on prone hamstring curl and clamshell more    Person(s) Educated  Patient;Spouse    Methods  Explanation;Demonstration    Comprehension  Verbalized understanding;Returned demonstration       PT Short Term Goals - 05/04/19 2203      PT SHORT TERM GOAL #1   Title  Pt will be independent with progressive HEP for strength and balance to continue gains on own.    Baseline  Pt reports that he has been working on exercises as instructed.    Status  Achieved      PT SHORT TERM GOAL #2   Title  Pt will increase gait speed from 0.51ms to >0.852m with SPC for improved gait safety.    Baseline  0.5732mcomfortable and 0.50m54mast with SPC on 04/28/2019    Status  Partially Met      PT SHORT TERM GOAL #3   Title  Pt will increase Berg Balance from 41/56 to >45/56 for improved balance and decreased fall risk.    Baseline  05/04/19: 48/56 scored today    Status  Achieved    Target Date  05/02/19      PT SHORT TERM GOAL #4   Title  Pt will ambulate >400' with SPC on level surfaces supervision for improved mobility.    Baseline  supervision with SPC on level surfaces.    Status  Achieved    Target Date  05/02/19      PT SHORT TERM GOAL #5   Title  Pt will be able to perform sit to stand without UE support from standard chair consistently for improved functional strength.    Baseline  Pt still needs UE support    Status  Not Met        PT Long Term Goals - 04/02/19 1221      PT LONG TERM GOAL #1   Title  Pt will ambulate > 500' on varied surfaces with SPC mod I for improved community mobility.    Time  8    Period  Weeks    Status  New    Target Date  06/01/19       PT LONG TERM  GOAL #2   Title  Pt will increase Berg from 41/56 to > 48/56 for improved balance and decreased fall risk.    Time  8    Period  Weeks    Status  New    Target Date  06/01/19      PT LONG TERM GOAL #3   Title  Pt will increase 30 sec sit to stand from 7 reps from standard chair with arms to 10 or more for improved functional strength.    Time  8    Period  Weeks    Status  New    Target Date  06/01/19      PT LONG TERM GOAL #4   Title  Pt will decrease TUG from 16 sec with SPC to <14 sec for improved balance and functional mobility.    Time  8    Period  Weeks    Status  New    Target Date  06/01/19            Plan - 05/26/19 1257    Clinical Impression Statement  Pt getting more return in left leg with improving hamstring activation and some left ankle muscle activation. Pt continues to benefit from skilled PT for strength, balance and gait training.    Personal Factors and Comorbidities  Comorbidity 3+    Comorbidities  CHF, HTN, hyperlipidemia, glomerulonephritis, pancytopenia/chronic anemia, seizure    Examination-Activity Limitations  Stairs;Stand;Squat;Locomotion Level;Transfers;Bed Mobility    Examination-Participation Restrictions  Community Activity;Shop;Driving;Cleaning;Yard Work    [employer] officer  Evolving/Moderate complexity    Rehab Potential  Good    PT Frequency  2x / week    PT Duration  8 weeks    PT Treatment/Interventions  DME Instruction;ADLs/Self Care Home Management;Gait training;Stair training;Functional mobility training;Therapeutic activities;Therapeutic exercise;Balance training;Orthotic Fit/Training;Patient/family education;Neuromuscular re-education;Wheelchair mobility training;Passive range of motion;Manual techniques    PT Next Visit Plan  Start checking LTGs for upcoming recert. Continue gait with SPC adding in dynamic gait activities (head turns, obstacles). Standing balance and strengthening to left LE.     PT Home Exercise Plan  Access Code: [email or web address]    Consulted and Agree with Plan of Care  Patient    Family Member Consulted  wife       Patient will benefit from skilled therapeutic intervention in order to improve the following deficits and impairments:  Abnormal gait, Difficulty walking, Decreased balance, Decreased mobility, Decreased strength, Decreased knowledge of use of DME, Decreased endurance, Decreased activity tolerance, Decreased range of motion, Pain, Increased muscle spasms  Visit Diagnosis: Muscle weakness (generalized)  Other abnormalities of gait and mobility     Problem List Patient Active Problem List   Diagnosis Date Noted  . Essential hypertension 05/25/2019  . 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 Acute blood loss anemia   . Steroid-induced hyperglycemia   . Acute on chronic anemia   . Seizures ([place])   . Spastic hemiparesis ([place])   . 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 Hypertension   . Thrombocytopenia ([place])   . Acute unilateral cerebral infarction in a watershed distribution [facility]) 01/15/2019  . Weakness   . CVA (cerebral vascular accident) ([place]) 01/12/2019  . Malnutrition of moderate degree 07/31/2018  . Elevated serum protein level   . Multiple myeloma ([place])   . Pancytopenia ([place])   . Normocytic normochromic anemia 07/16/2018  . Acute kidney failure ([place]) 07/16/2018  . NICM (nonischemic cardiomyopathy) ([place]) 11/12/2013  . History of ETOH abuse 11/12/2013  . Chronic combined systolic and  diastolic CHF (congestive heart failure) ([place]) 11/12/2013  . Congestive dilated cardiomyopathy ([place]) 11/04/2013  . Malignant hypertension     Electa Sniff, PT, DPT, NCS 05/26/2019, 12:59 PM  [facility] [address] [place], [place], [address] Phone: [phone]   Fax:  [phone]  Name: HEWITT GARNER MRN: [id number] Date of Birth: Sep 21, 1960

## 2019-05-28 ENCOUNTER — Other Ambulatory Visit: Payer: Self-pay

## 2019-05-28 ENCOUNTER — Ambulatory Visit: Payer: BC Managed Care – PPO

## 2019-05-28 DIAGNOSIS — M6281 Muscle weakness (generalized): Secondary | ICD-10-CM

## 2019-05-28 DIAGNOSIS — R2689 Other abnormalities of gait and mobility: Secondary | ICD-10-CM

## 2019-05-28 NOTE — Therapy (Signed)
[facility] [address] [place], [place], [address] Phone: [phone]   Fax:  [phone]  Physical Therapy Treatment  Patient Details  Name: Jeremy Grant MRN: [id number] Date of Birth: Aug 05, 1960 Referring Provider (PT): Dr. Letta Pate   Encounter Date: 05/28/2019  PT End of Session - 05/28/19 0937    Visit Number  27    Number of Visits  32    Date for PT Re-Evaluation  06/11/19    Authorization Type  BCBS    PT Start Time  0933    PT Stop Time  1019    PT Time Calculation (min)  46 min    Equipment Utilized During Treatment  Gait belt   left AFO   Activity Tolerance  Patient tolerated treatment well    Behavior During Therapy  [facility] for tasks assessed/performed       Past Medical History:  Diagnosis Date  . Arthritis   . CHF (congestive heart failure) ([place])   . Combined systolic and diastolic cardiac dysfunction    Echo 11/03/2013 EF 38%, grade 3 diastolic dysfunction  . Hypertension   . NICM (nonischemic cardiomyopathy) ([place])    a. L/RHC (11/05/13): RA: 3, RV 52/5, PA 49/19 (31), PCWP 10, AO 166/93, PA 67%, Fick CO/CI: 5.71/2.97, Lmain: normal, LAD: large, without signficant dz, first diagonal has 20% dz at ostium, LCx: normal, RCA: 30% stenosis at the bifurcation of PDA and PLOM    Past Surgical History:  Procedure Laterality Date  . BIOPSY  01/31/2019   Procedure: BIOPSY;  Surgeon: Otis Brace, MD;  Location: [facility] ENDOSCOPY;  Service: Gastroenterology;;  . ESOPHAGOGASTRODUODENOSCOPY N/A 01/31/2019   Procedure: ESOPHAGOGASTRODUODENOSCOPY (EGD);  Surgeon: Otis Brace, MD;  Location: [facility] ENDOSCOPY;  Service: Gastroenterology;  Laterality: N/A;  . IR FLUORO GUIDE CV LINE RIGHT  07/25/2018  . IR US GUIDE VASC ACCESS RIGHT  07/25/2018  . LEFT AND RIGHT HEART CATHETERIZATION WITH CORONARY ANGIOGRAM N/A 11/05/2013   Procedure: LEFT AND RIGHT HEART CATHETERIZATION WITH CORONARY ANGIOGRAM;  Surgeon: Peter M  Martinique, MD;  Location: [facility] CATH LAB;  Service: Cardiovascular;  Laterality: N/A;  . RIGHT HEART CATHETERIZATION N/A 01/14/2014   Procedure: RIGHT HEART CATH;  Surgeon: Larey Dresser, MD;  Location: [facility] CATH LAB;  Service: Cardiovascular;  Laterality: N/A;    There were no vitals filed for this visit.  Subjective Assessment - 05/28/19 0938    Subjective  Pt reports he is doing well. Goes tomorrow to kidney doctor.    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 8/31-9/3 then inpatient rehab 9/3-9/23.PMH: CHF, HTN, hyperlipidemia, glomerulonephritis, pancytopenia/chronic anemia, seizure left-sided post stroke    Currently in Pain?  No/denies                       [facility] Adult PT Treatment/Exercise - 05/28/19 1000      Ambulation/Gait   Ambulation/Gait  Yes    Ambulation/Gait Assistance  5: Supervision    Ambulation/Gait Assistance Details  Verbal cues for increased step length and heel stike. Gait performed with varied dynamic gait activities unender neuro-reed    Ambulation Distance (Feet)  690 Feet    Assistive device  Straight cane   left AFO   Gait Pattern  Step-through pattern    Ambulation Surface  Level;Indoor    Ramp  5: Supervision    Ramp Details (indicate cue type and reason)  x  3 with SPC over mat on ramp    Curb  5: Supervision    Curb Details (indicate cue type and reason)  x 3 with mat on top of curb with SPC    Gait Comments  Gait on treadmill x 6 min at 1.19mh covering 0.197mes. BP prior=156/86 and after=164/84. Pt needed verbal cuing to increase step length and foot clearance.      Neuro Re-ed    Neuro Re-ed Details   Dynamic gait activities with SPC: head turns in varied directions on command, stop/go on command, manual pertubations, speed changes, marching gait each about 100' with supervision/CGA for safety. Pt showing improving reactions to self and manual  pertubations. Better control with marching. Standing on ramp facing uphill with mat under feet: no UE x 30 sec eyes open and x 30 sec eyes closed and x 30 sec with manual pertubations.             PT Education - 05/28/19 1049    Education Details  Pt to continue with current HEP    Person(s) Educated  Patient    Methods  Explanation    Comprehension  Verbalized understanding       PT Short Term Goals - 05/04/19 2203      PT SHORT TERM GOAL #1   Title  Pt will be independent with progressive HEP for strength and balance to continue gains on own.    Baseline  Pt reports that he has been working on exercises as instructed.    Status  Achieved      PT SHORT TERM GOAL #2   Title  Pt will increase gait speed from 0.33m3mto >0.30m/90mith SPC for improved gait safety.    Baseline  0.73m/42mmfortable and 0.39m/s35mt with SPC on 04/28/2019    Status  Partially Met      PT SHORT TERM GOAL #3   Title  Pt will increase Berg Balance from 41/56 to >45/56 for improved balance and decreased fall risk.    Baseline  05/04/19: 48/56 scored today    Status  Achieved    Target Date  05/02/19      PT SHORT TERM GOAL #4   Title  Pt will ambulate >400' with SPC on level surfaces supervision for improved mobility.    Baseline  supervision with SPC on level surfaces.    Status  Achieved    Target Date  05/02/19      PT SHORT TERM GOAL #5   Title  Pt will be able to perform sit to stand without UE support from standard chair consistently for improved functional strength.    Baseline  Pt still needs UE support    Status  Not Met        PT Long Term Goals - 04/02/19 1221      PT LONG TERM GOAL #1   Title  Pt will ambulate > 500' on varied surfaces with SPC mod I for improved community mobility.    Time  8    Period  Weeks    Status  New    Target Date  06/01/19      PT LONG TERM GOAL #2   Title  Pt will increase Berg from 41/56 to > 48/56 for improved balance and decreased fall risk.     Time  8    Period  Weeks    Status  New    Target Date  06/01/19  PT LONG TERM GOAL #3   Title  Pt will increase 30 sec sit to stand from 7 reps from standard chair with arms to 10 or more for improved functional strength.    Time  8    Period  Weeks    Status  New    Target Date  06/01/19      PT LONG TERM GOAL #4   Title  Pt will decrease TUG from 16 sec with SPC to <14 sec for improved balance and functional mobility.    Time  8    Period  Weeks    Status  New    Target Date  06/01/19            Plan - 05/28/19 1051    Clinical Impression Statement  Pt tried gait on treadmill for first time which was more challenging to maintain longer step length and heel strike. Improved some as went on. Pt showing better stability on compliant surfaces with gait.    Personal Factors and Comorbidities  Comorbidity 3+    Comorbidities  CHF, HTN, hyperlipidemia, glomerulonephritis, pancytopenia/chronic anemia, seizure    Examination-Activity Limitations  Stairs;Stand;Squat;Locomotion Level;Transfers;Bed Mobility    Examination-Participation Restrictions  Community Activity;Shop;Driving;Cleaning;Yard Work    Merchant navy officer  Evolving/Moderate complexity    Rehab Potential  Good    PT Frequency  2x / week    PT Duration  8 weeks    PT Treatment/Interventions  DME Instruction;ADLs/Self Care Home Management;Gait training;Stair training;Functional mobility training;Therapeutic activities;Therapeutic exercise;Balance training;Orthotic Fit/Training;Patient/family education;Neuromuscular re-education;Wheelchair mobility training;Passive range of motion;Manual techniques    PT Next Visit Plan  Recert next week. Continue gait with SPC adding in dynamic gait activities (head turns, obstacles). Standing balance and strengthening to left LE.    PT Home Exercise Plan  Access Code: [email or web address]    Consulted and Agree with Plan of Care  Patient    Family Member Consulted  wife        Patient will benefit from skilled therapeutic intervention in order to improve the following deficits and impairments:  Abnormal gait, Difficulty walking, Decreased balance, Decreased mobility, Decreased strength, Decreased knowledge of use of DME, Decreased endurance, Decreased activity tolerance, Decreased range of motion, Pain, Increased muscle spasms  Visit Diagnosis: Muscle weakness (generalized)  Other abnormalities of gait and mobility     Problem List Patient Active Problem List   Diagnosis Date Noted  . Essential hypertension 05/25/2019  . 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 Acute blood loss anemia   . Steroid-induced hyperglycemia   . Acute on chronic anemia   . Seizures ([place])   . Spastic hemiparesis ([place])   . 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 Hypertension   . Thrombocytopenia ([place])   . Acute unilateral cerebral infarction in a watershed distribution [facility]) 01/15/2019  . Weakness   . CVA (cerebral vascular accident) ([place]) 01/12/2019  . Malnutrition of moderate degree 07/31/2018  . Elevated serum protein level   . Multiple myeloma ([place])   . Pancytopenia ([place])   . Normocytic normochromic anemia 07/16/2018  . Acute kidney failure ([place]) 07/16/2018  . NICM (nonischemic cardiomyopathy) ([place]) 11/12/2013  . History of ETOH abuse 11/12/2013  . Chronic combined systolic and diastolic CHF (congestive heart failure) ([place]) 11/12/2013  . Congestive dilated cardiomyopathy ([place]) 11/04/2013  . Malignant hypertension     Electa Sniff, PT, DPT, NCS 05/28/2019, 10:53 AM  [facility] [address] [place], [place], [address] Phone: [phone]   Fax:  [place]  Name: Jeremy Grant MRN: [id number] Date of Birth: 04/20/1961

## 2019-06-02 ENCOUNTER — Other Ambulatory Visit: Payer: Self-pay

## 2019-06-02 ENCOUNTER — Ambulatory Visit: Payer: BC Managed Care – PPO

## 2019-06-02 VITALS — BP 162/84

## 2019-06-02 DIAGNOSIS — R2689 Other abnormalities of gait and mobility: Secondary | ICD-10-CM

## 2019-06-02 DIAGNOSIS — M6281 Muscle weakness (generalized): Secondary | ICD-10-CM | POA: Diagnosis not present

## 2019-06-02 DIAGNOSIS — I69354 Hemiplegia and hemiparesis following cerebral infarction affecting left non-dominant side: Secondary | ICD-10-CM

## 2019-06-02 NOTE — Therapy (Signed)
[facility] [address] [place], [place], [address] Phone: [phone]   Fax:  [phone]  Physical Therapy Treatment/Recert  Patient Details  Name: Jeremy Grant MRN: [id number] Date of Birth: 09-30-1960 Referring Provider (PT): Dr. Letta Pate   Encounter Date: 06/02/2019  PT End of Session - 06/02/19 0938    Visit Number  28    Number of Visits  45    Date for PT Re-Evaluation  08/31/19    Authorization Type  BCBS    PT Start Time  0933    PT Stop Time  1017    PT Time Calculation (min)  44 min    Equipment Utilized During Treatment  Gait belt   left AFO   Activity Tolerance  Patient tolerated treatment well    Behavior During Therapy  [facility] for tasks assessed/performed       Past Medical History:  Diagnosis Date  . Arthritis   . CHF (congestive heart failure) ([place])   . Combined systolic and diastolic cardiac dysfunction    Echo 11/03/2013 EF 35%, grade 3 diastolic dysfunction  . Hypertension   . NICM (nonischemic cardiomyopathy) ([place])    a. L/RHC (11/05/13): RA: 3, RV 52/5, PA 49/19 (31), PCWP 10, AO 166/93, PA 67%, Fick CO/CI: 5.71/2.97, Lmain: normal, LAD: large, without signficant dz, first diagonal has 20% dz at ostium, LCx: normal, RCA: 30% stenosis at the bifurcation of PDA and PLOM    Past Surgical History:  Procedure Laterality Date  . BIOPSY  01/31/2019   Procedure: BIOPSY;  Surgeon: Otis Brace, MD;  Location: [facility] ENDOSCOPY;  Service: Gastroenterology;;  . ESOPHAGOGASTRODUODENOSCOPY N/A 01/31/2019   Procedure: ESOPHAGOGASTRODUODENOSCOPY (EGD);  Surgeon: Otis Brace, MD;  Location: [facility] ENDOSCOPY;  Service: Gastroenterology;  Laterality: N/A;  . IR FLUORO GUIDE CV LINE RIGHT  07/25/2018  . IR US GUIDE VASC ACCESS RIGHT  07/25/2018  . LEFT AND RIGHT HEART CATHETERIZATION WITH CORONARY ANGIOGRAM N/A 11/05/2013   Procedure: LEFT AND RIGHT HEART CATHETERIZATION WITH CORONARY ANGIOGRAM;  Surgeon:  Peter M Martinique, MD;  Location: [facility] CATH LAB;  Service: Cardiovascular;  Laterality: N/A;  . RIGHT HEART CATHETERIZATION N/A 01/14/2014   Procedure: RIGHT HEART CATH;  Surgeon: Larey Dresser, MD;  Location: [facility] CATH LAB;  Service: Cardiovascular;  Laterality: N/A;    Vitals:   06/02/19 0940  BP: (!) 162/84    Subjective Assessment - 06/02/19 0938    Subjective  Pt and wife report that he is just feeling a little off today. He did catch left foot on w/c this morning and caught himself with left hand on w/c. Pt saw kidney doctor and they were pretty happy with how he is doing. Did some bloodwork.    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 8/31-9/3 then inpatient rehab 9/3-9/23.PMH: CHF, HTN, hyperlipidemia, glomerulonephritis, pancytopenia/chronic anemia, seizure left-sided post stroke    Currently in Pain?  No/denies                       [facility] Adult PT Treatment/Exercise - 06/02/19 0945      Transfers   Transfers  Sit to Stand;Stand to Sit    Sit to Stand  6: Modified independent (Device/Increase time)    Stand to Sit  6: Modified independent (Device/Increase time)    Comments  30 sec sit to stand=13. Pt performed sit to stand from mat without UE support  x 3 with verbal cues to position feet back behind knees and lean forward. Needed a couple attempts to rise at first and close SBA for safety.      Ambulation/Gait   Ambulation/Gait  Yes    Ambulation/Gait Assistance  5: Supervision    Ambulation Distance (Feet)  200 Feet    Assistive device  Straight cane   left AFO   Gait Pattern  Step-through pattern    Ambulation Surface  Level;Indoor    Gait velocity  15 sec comfortable=0.74ms and 11 sec fast=0.97m    Stairs  Yes    Stairs Assistance  5: Supervision    Stair Management Technique  Two rails;Step to pattern    Number of Stairs  4      Standardized Balance Assessment    Standardized Balance Assessment  Berg Balance Test;Timed Up and Go Test;Dynamic Gait Index      Berg Balance Test   Sit to Stand  Able to stand  independently using hands    Standing Unsupported  Able to stand safely 2 minutes    Sitting with Back Unsupported but Feet Supported on Floor or Stool  Able to sit safely and securely 2 minutes    Stand to Sit  Sits safely with minimal use of hands    Transfers  Able to transfer safely, minor use of hands    Standing Unsupported with Eyes Closed  Able to stand 10 seconds safely    Standing Ubsupported with Feet Together  Able to place feet together independently and stand 1 minute safely    From Standing, Reach Forward with Outstretched Arm  Can reach confidently >25 cm (10")    From Standing Position, Pick up Object from Floor  Able to pick up shoe safely and easily    From Standing Position, Turn to Look Behind Over each Shoulder  Looks behind one side only/other side shows less weight shift    Turn 360 Degrees  Able to turn 360 degrees safely but slowly    Standing Unsupported, Alternately Place Feet on Step/Stool  Able to complete >2 steps/needs minimal assist    Standing Unsupported, One Foot in Front  Able to plae foot ahead of the other independently and hold 30 seconds    Standing on One Leg  Tries to lift leg/unable to hold 3 seconds but remains standing independently    Total Score  45      Dynamic Gait Index   Level Surface  Mild Impairment    Change in Gait Speed  Mild Impairment    Gait with Horizontal Head Turns  Mild Impairment    Gait with Vertical Head Turns  Mild Impairment    Gait and Pivot Turn  Mild Impairment    Step Over Obstacle  Mild Impairment    Step Around Obstacles  Normal    Steps  Moderate Impairment    Total Score  16      Timed Up and Go Test   TUG  Normal TUG    Normal TUG (seconds)  12.68             PT Education - 06/02/19 1345    Education Details  Pt to continue with current HEP    Person(s)  Educated  Patient    Methods  Explanation    Comprehension  Verbalized understanding       PT Short Term Goals - 06/02/19 0957      PT SHORT TERM GOAL #1  Title  Pt will be independent with progressive HEP for strength and balance to continue gains on own.    Baseline  Pt reports that he has been working on exercises as instructed.    Status  Achieved      PT SHORT TERM GOAL #2   Title  Pt will increase gait speed from 0.6m/s to >0.8m/s with SPC for improved gait safety.    Baseline  0.57m/s comfortable and 0.74m/s fast with SPC on 04/28/2019, 06/02/2019 comfortable =0.66m/s and fast=0.9m/s    Status  Partially Met      PT SHORT TERM GOAL #3   Title  Pt will increase Berg Balance from 41/56 to >45/56 for improved balance and decreased fall risk.    Baseline  05/04/19: 48/56 scored today    Status  Achieved    Target Date  05/02/19      PT SHORT TERM GOAL #4   Title  Pt will ambulate >400' with SPC on level surfaces supervision for improved mobility.    Baseline  supervision with SPC on level surfaces.    Status  Achieved    Target Date  05/02/19      PT SHORT TERM GOAL #5   Title  Pt will be able to perform sit to stand without UE support from standard chair consistently for improved functional strength.    Baseline  Pt is not able to perform consistently on first time sometimes needing multiple attempts without UE support    Status  Not Met        PT Long Term Goals - 06/02/19 0958      PT LONG TERM GOAL #1   Title  Pt will ambulate > 500' on varied surfaces with SPC mod I for improved community mobility.    Baseline  supervision on level and supervision/CGA on nonlevel    Time  8    Period  Weeks    Status  On-going      PT LONG TERM GOAL #2   Title  Pt will increase Berg from 41/56 to > 48/56 for improved balance and decreased fall risk.    Baseline  45/56 on 06/02/19    Time  8    Period  Weeks    Status  On-going      PT LONG TERM GOAL #3   Title  Pt will  increase 30 sec sit to stand from 7 reps from standard chair with arms to 10 or more for improved functional strength.    Baseline  13 reps with 30 sec sit to stand on 06/02/19    Time  8    Period  Weeks    Status  Achieved      PT LONG TERM GOAL #4   Title  Pt will decrease TUG from 16 sec with SPC to <14 sec for improved balance and functional mobility.    Baseline  TUG=12.68 sec meeting goal on 06/02/19    Time  8    Period  Weeks    Status  Achieved       Updated PT goals: PT Short Term Goals - 06/02/19 1818      PT SHORT TERM GOAL #1   Title  Pt will ambulate >400' on varied surfaces with SPC supervision for improved community mobility. (target date for STGs=07/02/19)    Time  4    Period  Weeks    Status  New    Target Date  07/02/19        PT SHORT TERM GOAL #2   Title  Pt will increase gait speed from 0.88ms to >0.845m with SPC for improved gait safety.    Baseline  06/02/2019 comfortable =0.6671mand fast=0.65m/38m  Time  4    Period  Weeks    Status  On-going    Target Date  07/02/19      PT SHORT TERM GOAL #3   Title  Pt will be able to perform sit to stand without UE support from standard chair consistently for improved functional strength.    Time  4    Period  Weeks    Status  On-going    Target Date  07/02/19        PT Long Term Goals - 06/02/19 1823      PT LONG TERM GOAL #1   Title  Pt will ambulate > 500' on varied surfaces with SPC mod I for improved community mobility.(Target date fo LTGs=08/01/19)    Baseline  supervision on level and supervision/CGA on nonlevel    Time  8    Period  Weeks    Status  On-going    Target Date  08/01/19      PT LONG TERM GOAL #2   Title  Pt will increase Berg from 41/56 to > 48/56 for improved balance and decreased fall risk.    Baseline  45/56 on 06/02/19    Time  8    Period  Weeks    Status  On-going    Target Date  08/01/19      PT LONG TERM GOAL #3   Title  Pt will increase DGI from 16/21 to 19/21 for  improved balance and gait safety.    Baseline  16/21 on 06/02/19    Time  8    Period  Weeks    Status  New    Target Date  08/01/19           Plan - 06/02/19 1351    Clinical Impression Statement  Pt was reassessed today. Met 30 sec sit to stand goal as well as TUG goal showing improving functional strength and balance. Berg score still indicating fall risk. Pt's gait speed is safe for household ambulator but indicated decreased safety in community with SPC.[place] also performed DGI today for more functional gait assessment indicating fall risk with score of 16/21. Pt continues to benefit from skilled PT as continues to make gains. Will continue to work on gait with SPC, balance and strength to maximize independence.    Personal Factors and Comorbidities  Comorbidity 3+    Comorbidities  CHF, HTN, hyperlipidemia, glomerulonephritis, pancytopenia/chronic anemia, seizure    Examination-Activity Limitations  Stairs;Stand;Squat;Locomotion Level;Transfers;Bed Mobility    Examination-Participation Restrictions  Community Activity;Shop;Driving;Cleaning;Yard Work    StabMerchant navy officerolving/Moderate complexity    Rehab Potential  Good    PT Frequency  2x / week    PT Duration  8 weeks    PT Treatment/Interventions  DME Instruction;ADLs/Self Care Home Management;Gait training;Stair training;Functional mobility training;Therapeutic activities;Therapeutic exercise;Balance training;Orthotic Fit/Training;Patient/family education;Neuromuscular re-education;Wheelchair mobility training;Passive range of motion;Manual techniques    PT Next Visit Plan  PT did recert at last visit early. Continue gait with SPC adding in dynamic gait activities (head turns, obstacles). Gait on nonlevel surfaces with SPC. Standing balance and strengthening to left LE.    PT Home Exercise Plan  Access Code: [email or web address]    Consulted and Agree with Plan of Care  Patient  Family Member Consulted  wife        Patient will benefit from skilled therapeutic intervention in order to improve the following deficits and impairments:  Abnormal gait, Difficulty walking, Decreased balance, Decreased mobility, Decreased strength, Decreased knowledge of use of DME, Decreased endurance, Decreased activity tolerance, Decreased range of motion, Pain, Increased muscle spasms  Visit Diagnosis: Muscle weakness (generalized)  Other abnormalities of gait and mobility  Hemiplegia and hemiparesis following cerebral infarction affecting left non-dominant side [facility])     Problem List Patient Active Problem List   Diagnosis Date Noted  . Essential hypertension 05/25/2019  . 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 Acute blood loss anemia   . Steroid-induced hyperglycemia   . Acute on chronic anemia   . Seizures ([place])   . Spastic hemiparesis ([place])   . 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 Hypertension   . Thrombocytopenia ([place])   . Acute unilateral cerebral infarction in a watershed distribution [facility]) 01/15/2019  . Weakness   . CVA (cerebral vascular accident) ([place]) 01/12/2019  . Malnutrition of moderate degree 07/31/2018  . Elevated serum protein level   . Multiple myeloma ([place])   . Pancytopenia ([place])   . Normocytic normochromic anemia 07/16/2018  . Acute kidney failure ([place]) 07/16/2018  . NICM (nonischemic cardiomyopathy) ([place]) 11/12/2013  . History of ETOH abuse 11/12/2013  . Chronic combined systolic and diastolic CHF (congestive heart failure) ([place]) 11/12/2013  . Congestive dilated cardiomyopathy ([place]) 11/04/2013  . Malignant hypertension     Electa Sniff, PT, DPT, NCS 06/02/2019, 1:57 PM  [facility] [address] [place], [place], [address] Phone: [phone]   Fax:  [phone]  Name: NIGEL ERICSSON MRN: [id number] Date of Birth: 1961/04/01

## 2019-06-04 ENCOUNTER — Ambulatory Visit: Payer: BC Managed Care – PPO | Admitting: Physical Therapy

## 2019-06-04 ENCOUNTER — Encounter: Payer: Self-pay | Admitting: Physical Therapy

## 2019-06-04 ENCOUNTER — Other Ambulatory Visit: Payer: Self-pay

## 2019-06-04 DIAGNOSIS — I69354 Hemiplegia and hemiparesis following cerebral infarction affecting left non-dominant side: Secondary | ICD-10-CM

## 2019-06-04 DIAGNOSIS — R2689 Other abnormalities of gait and mobility: Secondary | ICD-10-CM

## 2019-06-04 DIAGNOSIS — R2681 Unsteadiness on feet: Secondary | ICD-10-CM

## 2019-06-04 DIAGNOSIS — M6281 Muscle weakness (generalized): Secondary | ICD-10-CM | POA: Diagnosis not present

## 2019-06-05 NOTE — Therapy (Signed)
[facility] [address] [place], [place], [address] Phone: [phone]   Fax:  [phone]  Physical Therapy Treatment  Patient Details  Name: Jeremy Grant MRN: [id number] Date of Birth: 03-04-1961 Referring Provider (PT): Dr. Letta Pate   Encounter Date: 06/04/2019  PT End of Session - 06/04/19 0936    Visit Number  29    Number of Visits  45    Date for PT Re-Evaluation  08/31/19    Authorization Type  BCBS    PT Start Time  0932    PT Stop Time  1014    PT Time Calculation (min)  42 min    Equipment Utilized During Treatment  Gait belt   left AFO   Activity Tolerance  Patient tolerated treatment well    Behavior During Therapy  [facility] for tasks assessed/performed       Past Medical History:  Diagnosis Date  . Arthritis   . CHF (congestive heart failure) ([place])   . Combined systolic and diastolic cardiac dysfunction    Echo 11/03/2013 EF 95%, grade 3 diastolic dysfunction  . Hypertension   . NICM (nonischemic cardiomyopathy) ([place])    a. L/RHC (11/05/13): RA: 3, RV 52/5, PA 49/19 (31), PCWP 10, AO 166/93, PA 67%, Fick CO/CI: 5.71/2.97, Lmain: normal, LAD: large, without signficant dz, first diagonal has 20% dz at ostium, LCx: normal, RCA: 30% stenosis at the bifurcation of PDA and PLOM    Past Surgical History:  Procedure Laterality Date  . BIOPSY  01/31/2019   Procedure: BIOPSY;  Surgeon: Otis Brace, MD;  Location: [facility];  Service: Gastroenterology;;  . ESOPHAGOGASTRODUODENOSCOPY N/A 01/31/2019   Procedure: ESOPHAGOGASTRODUODENOSCOPY (EGD);  Surgeon: Otis Brace, MD;  Location[facility] ENDOSCOPY;  Service: Gastroenterology;  Laterality: N/A;  . IR FLUORO GUIDE CV LINE RIGHT  07/25/2018  . IR US GUIDE VASC ACCESS RIGHT  07/25/2018  . LEFT AND RIGHT HEART CATHETERIZATION WITH CORONARY ANGIOGRAM N/A 11/05/2013   Procedure: LEFT AND RIGHT HEART CATHETERIZATION WITH CORONARY ANGIOGRAM;  Surgeon: Peter M  Martinique, MD;  Location: [facility] CATH LAB;  Service: Cardiovascular;  Laterality: N/A;  . RIGHT HEART CATHETERIZATION N/A 01/14/2014   Procedure: RIGHT HEART CATH;  Surgeon: Larey Dresser, MD;  Location: [facility] CATH LAB;  Service: Cardiovascular;  Laterality: N/A;    There were no vitals filed for this visit.  Subjective Assessment - 06/04/19 0936    Subjective  No new complaints. No falls or pain report. Going well with using cane at home with spouse assistance.    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 8/31-9/3 then inpatient rehab 9/3-9/23.PMH: CHF, HTN, hyperlipidemia, glomerulonephritis, pancytopenia/chronic anemia, seizure left-sided post stroke    Patient Stated Goals  Pt wants to be able to walk again.    Currently in Pain?  No/denies    Pain Score  0-No pain             [facility] Adult PT Treatment/Exercise - 06/04/19 0937      Transfers   Transfers  Sit to Stand;Stand to Sit    Sit to Stand  6: Modified independent (Device/Increase time);With upper extremity assist;From bed;From chair/3-in-1    Stand to Sit  6: Modified independent (Device/Increase time);With upper extremity assist;To bed;To chair/3-in-1      Ambulation/Gait   Ambulation/Gait  Yes    Ambulation/Gait Assistance  5: Supervision;4: Min guard    Ambulation/Gait Assistance Details  gait  around track with scanning all directions randomly with slight change in pathway noted, along with slight decrease in gait speed. no loss of balance noted.     Ambulation Distance (Feet)  230 Feet   x1,    Assistive device  Straight cane   left AFO   Gait Pattern  Step-through pattern;Decreased stride length;Narrow base of support    Ambulation Surface  Level;Indoor    Gait Comments  With cane/AFO- gait over red/blue mats on floor to simulate outdoor surfaces for 2 laps, then added scattered bean bags under mats for added compliant surface challenge  for 2 laps with cane only. min guard to min assist for balance.        High Level Balance   High Level Balance Activities  Figure 8 turns;Negotiating over obstacles;Marching forwards;Marching backwards;Tandem walking    High Level Balance Comments  with cane/AFO- foward stepping over 3 bolsters, figure 8 around two stools with 180 degree turn and back through course. min guard to min assist for balance with cues for sequencing with stepping over bolsters and cane placement with figure 8's; red/blue mats next to counter with single to no UE support for marching fwd/bwd, then tandem walking fwd/bwd for 3 laps each with cues on posture, ex form/technique and weight shifting. min guard to min assist for balance.            Balance Exercises - 06/04/19 0957      Balance Exercises: Standing   Rockerboard  Anterior/posterior;Lateral;Head turns;EC;30 seconds;10 reps;Limitations    Rockerboard Limitations  both ways on balance board: holding the board steady with EC no head movements, progressing to EC head movements left<>right, then up<>down. light UE support on bars as needed for balance with min guard to min assist for balance.     Other Standing Exercises  seated with feet across red beam: sit<>stands for 2 sets of 10 reps with UE assist from low mat, min guard to min assist.           PT Short Term Goals - 06/02/19 1818      PT SHORT TERM GOAL #1   Title  Pt will ambulate >400' on varied surfaces with [facility] supervision for improved community mobility. (target date for STGs=07/02/19)    Time  4    Period  Weeks    Status  New    Target Date  07/02/19      PT SHORT TERM GOAL #2   Title  Pt will increase gait speed from 0.55ms to >0.852m with SPC for improved gait safety.    Baseline  06/02/2019 comfortable =0.6642mand fast=0.16m/78m  Time  4    Period  Weeks    Status  On-going    Target Date  07/02/19      PT SHORT TERM GOAL #3   Title  Pt will be able to perform sit to stand  without UE support from standard chair consistently for improved functional strength.    Time  4    Period  Weeks    Status  On-going    Target Date  07/02/19        PT Long Term Goals - 06/02/19 1823      PT LONG TERM GOAL #1   Title  Pt will ambulate > 500' on varied surfaces with SPC mod I for improved community mobility.(Target date fo LTGs=08/01/19)    Baseline  supervision on level and supervision/CGA on nonlevel    Time  8  Period  Weeks    Status  On-going    Target Date  08/01/19      PT LONG TERM GOAL #2   Title  Pt will increase Berg from 41/56 to > 48/56 for improved balance and decreased fall risk.    Baseline  45/56 on 06/02/19    Time  8    Period  Weeks    Status  On-going    Target Date  08/01/19      PT LONG TERM GOAL #3   Title  Pt will increase DGI from 16/21 to 19/21 for improved balance and gait safety.    Baseline  16/21 on 06/02/19    Time  8    Period  Weeks    Status  New    Target Date  08/01/19            Plan - 06/04/19 4008    Clinical Impression Statement  Today's skilled session continued to focus on dynamic gait with cane and balance reactions. Rest breaks taken due to fatigue, no other issues were reported. The pt is progressing toward goals and should benefit from continued PT to progress toward unmet goals.    Personal Factors and Comorbidities  Comorbidity 3+    Comorbidities  CHF, HTN, hyperlipidemia, glomerulonephritis, pancytopenia/chronic anemia, seizure    Examination-Activity Limitations  Stairs;Stand;Squat;Locomotion Level;Transfers;Bed Mobility    Examination-Participation Restrictions  Community Activity;Shop;Driving;Cleaning;Yard Work    [employer]chant [employer] officer  Evolving/Moderate complexity    Rehab Potential  Good    PT Frequency  2x / week    PT Duration  8 weeks    PT Treatment/Interventions  DME Instruction;ADLs/Self Care Home Management;Gait training;Stair training;Functional mobility  training;Therapeutic activities;Therapeutic exercise;Balance training;Orthotic Fit/Training;Patient/family education;Neuromuscular re-education;Wheelchair mobility training;Passive range of motion;Manual techniques    PT Next Visit Plan  Continue gait with SPC adding in dynamic gait activities (head turns, obstacles). Gait on nonlevel surfaces with SPC. Standing balance and strengthening to left LE.    PT Home Exercise Plan  Access Code: [email or web address]    Consulted and Agree with Plan of Care  Patient    Family Member Consulted  wife       Patient will benefit from skilled therapeutic intervention in order to improve the following deficits and impairments:  Abnormal gait, Difficulty walking, Decreased balance, Decreased mobility, Decreased strength, Decreased knowledge of use of DME, Decreased endurance, Decreased activity tolerance, Decreased range of motion, Pain, Increased muscle spasms  Visit Diagnosis: Muscle weakness (generalized)  Other abnormalities of gait and mobility  Hemiplegia and hemiparesis following cerebral infarction affecting left non-dominant side (HCC)  Unsteadiness on feet     Problem List Patient Active Problem List   Diagnosis Date Noted  . Essential hypertension 05/25/2019  . 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 Acute blood loss anemia   . Steroid-induced hyperglycemia   . Acute on chronic anemia   . Seizures ([place])   . Spastic hemiparesis ([place])   . 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 Hypertension   . Thrombocytopenia ([place])   . Acute unilateral cerebral infarction in a watershed distribution [facility]) 01/15/2019  . Weakness   . CVA (cerebral vascular accident) ([place]) 01/12/2019  . Malnutrition of moderate degree 07/31/2018  . Elevated serum protein level   . Multiple myeloma ([place])   . Pancytopenia ([place])   . Normocytic normochromic anemia 07/16/2018  . Acute kidney failure ([place]) 07/16/2018  . NICM (nonischemic cardiomyopathy) ([place]) 11/12/2013  . History of ETOH abuse  11/12/2013  . Chronic combined  systolic and diastolic CHF (congestive heart failure) ([place]) 11/12/2013  . Congestive dilated cardiomyopathy ([place]) 11/04/2013  . Malignant hypertension     [place], [place], [facility] [address] [place], [place] [address] [phone] 06/05/19, 3:25 PM   Name: Jeremy Grant MRN: [id number] Date of Birth: 26-Aug-1960

## 2019-06-08 ENCOUNTER — Other Ambulatory Visit: Payer: Self-pay

## 2019-06-08 ENCOUNTER — Ambulatory Visit (HOSPITAL_COMMUNITY)
Admission: RE | Admit: 2019-06-08 | Discharge: 2019-06-08 | Disposition: A | Discharge status: Home / Self Care | Payer: BC Managed Care – PPO | Source: Ambulatory Visit | Attending: Nephrology | Admitting: Nephrology

## 2019-06-08 VITALS — BP 171/80 | HR 64 | Temp 98.2°F | Resp 18

## 2019-06-08 DIAGNOSIS — N179 Acute kidney failure, unspecified: Secondary | ICD-10-CM | POA: Diagnosis not present

## 2019-06-08 LAB — IRON AND TIBC
Iron: 79 ug/dL (ref 45–182)
Saturation Ratios: 29 % (ref 17.9–39.5)
TIBC: 274 ug/dL (ref 250–450)
UIBC: 195 ug/dL

## 2019-06-08 LAB — FERRITIN: Ferritin: 681 ng/mL — ABNORMAL HIGH (ref 24–336)

## 2019-06-08 LAB — POCT HEMOGLOBIN-HEMACUE: Hemoglobin: 10.6 g/dL — ABNORMAL LOW (ref 13.0–17.0)

## 2019-06-08 MED ORDER — EPOETIN ALFA-EPBX 10000 UNIT/ML IJ SOLN
INTRAMUSCULAR | Status: AC
  Administered 2019-06-08: 20000 [IU] via SUBCUTANEOUS
  Filled 2019-06-08: qty 2

## 2019-06-08 MED ORDER — EPOETIN ALFA-EPBX 10000 UNIT/ML IJ SOLN: 20000.0000 [IU] | INTRAMUSCULAR | Status: DC

## 2019-06-09 ENCOUNTER — Other Ambulatory Visit: Payer: Self-pay

## 2019-06-09 ENCOUNTER — Ambulatory Visit: Payer: BC Managed Care – PPO

## 2019-06-09 DIAGNOSIS — M6281 Muscle weakness (generalized): Secondary | ICD-10-CM | POA: Diagnosis not present

## 2019-06-09 DIAGNOSIS — R2689 Other abnormalities of gait and mobility: Secondary | ICD-10-CM

## 2019-06-09 NOTE — Therapy (Signed)
[facility] [address] [place], [place], [address] Phone: [phone]   Fax:  [phone]  Physical Therapy Treatment  Patient Details  Name: Jeremy Grant MRN: [id number] Date of Birth: 25-Feb-1961 Referring Provider (PT): Dr. Letta Pate   Encounter Date: 06/09/2019  PT End of Session - 06/09/19 0940    Visit Number  30    Number of Visits  45    Date for PT Re-Evaluation  08/31/19    Authorization Type  BCBS    PT Start Time  0932    PT Stop Time  1012    PT Time Calculation (min)  40 min    Equipment Utilized During Treatment  Gait belt   left AFO   Activity Tolerance  Patient tolerated treatment well    Behavior During Therapy  [facility] for tasks assessed/performed       Past Medical History:  Diagnosis Date  . Arthritis   . CHF (congestive heart failure) ([place])   . Combined systolic and diastolic cardiac dysfunction    Echo 11/03/2013 EF 89%, grade 3 diastolic dysfunction  . Hypertension   . NICM (nonischemic cardiomyopathy) ([place])    a. L/RHC (11/05/13): RA: 3, RV 52/5, PA 49/19 (31), PCWP 10, AO 166/93, PA 67%, Fick CO/CI: 5.71/2.97, Lmain: normal, LAD: large, without signficant dz, first diagonal has 20% dz at ostium, LCx: normal, RCA: 30% stenosis at the bifurcation of PDA and PLOM    Past Surgical History:  Procedure Laterality Date  . BIOPSY  01/31/2019   Procedure: BIOPSY;  Surgeon: Otis Brace, MD;  Location: [facility];  Service: Gastroenterology;;  . ESOPHAGOGASTRODUODENOSCOPY N/A 01/31/2019   Procedure: ESOPHAGOGASTRODUODENOSCOPY (EGD);  Surgeon: Otis Brace, MD;  Location: [facility] ENDOSCOPY;  Service: Gastroenterology;  Laterality: N/A;  . IR FLUORO GUIDE CV LINE RIGHT  07/25/2018  . IR US GUIDE VASC ACCESS RIGHT  07/25/2018  . LEFT AND RIGHT HEART CATHETERIZATION WITH CORONARY ANGIOGRAM N/A 11/05/2013   Procedure: LEFT AND RIGHT HEART CATHETERIZATION WITH CORONARY ANGIOGRAM;  Surgeon: Peter M  Martinique, MD;  Location: [facility] CATH LAB;  Service: Cardiovascular;  Laterality: N/A;  . RIGHT HEART CATHETERIZATION N/A 01/14/2014   Procedure: RIGHT HEART CATH;  Surgeon: Larey Dresser, MD;  Location: [facility] CATH LAB;  Service: Cardiovascular;  Laterality: N/A;    There were no vitals filed for this visit.  Subjective Assessment - 06/09/19 0940    Subjective  Pt reports that last couple days his left ankle has been swelling some. He did get hemoglobin shot as was 10.6 yesterday but doctor happy with how it's been.  PT measured at ankle and circumference 26 cm with +2 pitting to mid calf. Pt's wife goes back to work Architectural technologist.    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 8/31-9/3 then inpatient rehab 9/3-9/23.PMH: CHF, HTN, hyperlipidemia, glomerulonephritis, pancytopenia/chronic anemia, seizure left-sided post stroke    Patient Stated Goals  Pt wants to be able to walk again.                       [facility] Adult PT Treatment/Exercise - 06/09/19 0951      Ambulation/Gait   Ambulation/Gait  Yes    Ambulation/Gait Assistance  5: Supervision    Ambulation Distance (Feet)  800 Feet    Assistive device  Straight cane   left AFO   Gait Pattern  Step-through pattern    Ambulation  Surface  Level;Indoor    Stairs  Yes    Stairs Assistance  4: Min guard;4: Min assist    Stairs Assistance Details (indicate cue type and reason)  Pt challenged with only using cane especially with descent needing min assist a couple times not trusting left leg when came down.    Stair Management Technique  Step to pattern   SPC   Number of Stairs  8    Ramp  5: Supervision    Ramp Details (indicate cue type and reason)  x 3 with SPC    Gait Comments  During gait patient performed various dynamic gait actiivities with SPC and CGA: marching, large steps, head turns, stop/go,  forwards/backwards, 180 degree turns. Only a couple  times needing CGA for safety.      Neuro Re-ed    Neuro Re-ed Details   Gait over obstacle course: 360 degree turn around cone, walking over mat with tapping 3 cones along the way, walking over mat with stepping stones underneath for more unlevel surface. Performed with SPC x 4 times CGA/min assist then performed the same with trying to step over the cones on mat instead x 4 laps.  Along counter: reciprocal steps over 4 cones with SPC and occasional counter support CGA x 6 with verbal cues to control stance time.             PT Education - 06/09/19 1041    Education Details  Pt to continue with current HEP    Person(s) Educated  Patient    Methods  Explanation    Comprehension  Verbalized understanding       PT Short Term Goals - 06/02/19 1818      PT SHORT TERM GOAL #1   Title  Pt will ambulate >400' on varied surfaces with [facility] supervision for improved community mobility. (target date for STGs=07/02/19)    Time  4    Period  Weeks    Status  New    Target Date  07/02/19      PT SHORT TERM GOAL #2   Title  Pt will increase gait speed from 0.63ms to >0.872m with SPC for improved gait safety.    Baseline  06/02/2019 comfortable =0.6630mand fast=0.9m/84m  Time  4    Period  Weeks    Status  On-going    Target Date  07/02/19      PT SHORT TERM GOAL #3   Title  Pt will be able to perform sit to stand without UE support from standard chair consistently for improved functional strength.    Time  4    Period  Weeks    Status  On-going    Target Date  07/02/19        PT Long Term Goals - 06/02/19 1823      PT LONG TERM GOAL #1   Title  Pt will ambulate > 500' on varied surfaces with SPC mod I for improved community mobility.(Target date fo LTGs=08/01/19)    Baseline  supervision on level and supervision/CGA on nonlevel    Time  8    Period  Weeks    Status  On-going    Target Date  08/01/19      PT LONG TERM GOAL #2   Title  Pt will increase Berg from 41/56 to > 48/56  for improved balance and decreased fall risk.    Baseline  45/56 on 06/02/19    Time  8  Period  Weeks    Status  On-going    Target Date  08/01/19      PT LONG TERM GOAL #3   Title  Pt will increase DGI from 16/21 to 19/21 for improved balance and gait safety.    Baseline  16/21 on 06/02/19    Time  8    Period  Weeks    Status  New    Target Date  08/01/19            Plan - 06/09/19 1041    Clinical Impression Statement  Pt continues to show improvement with stability with dynamic gait activities. Is challenged with increased SLS time during gait. Stairs with just cane was difficult with patient being more fearful with descent.    Personal Factors and Comorbidities  Comorbidity 3+    Comorbidities  CHF, HTN, hyperlipidemia, glomerulonephritis, pancytopenia/chronic anemia, seizure    Examination-Activity Limitations  Stairs;Stand;Squat;Locomotion Level;Transfers;Bed Mobility    Examination-Participation Restrictions  Community Activity;Shop;Driving;Cleaning;Yard Work    Merchant navy officer  Evolving/Moderate complexity    Rehab Potential  Good    PT Frequency  2x / week    PT Duration  8 weeks    PT Treatment/Interventions  DME Instruction;ADLs/Self Care Home Management;Gait training;Stair training;Functional mobility training;Therapeutic activities;Therapeutic exercise;Balance training;Orthotic Fit/Training;Patient/family education;Neuromuscular re-education;Wheelchair mobility training;Passive range of motion;Manual techniques    PT Next Visit Plan  Continue gait with SPC adding in dynamic gait activities (head turns, obstacles). Gait on nonlevel surfaces with SPC. Standing balance and strengthening to left LE.    PT Home Exercise Plan  Access Code: [email or web address]    Consulted and Agree with Plan of Care  Patient    Family Member Consulted  wife       Patient will benefit from skilled therapeutic intervention in order to improve the following deficits and  impairments:  Abnormal gait, Difficulty walking, Decreased balance, Decreased mobility, Decreased strength, Decreased knowledge of use of DME, Decreased endurance, Decreased activity tolerance, Decreased range of motion, Pain, Increased muscle spasms  Visit Diagnosis: Muscle weakness (generalized)  Other abnormalities of gait and mobility     Problem List Patient Active Problem List   Diagnosis Date Noted  . Essential hypertension 05/25/2019  . 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 Acute blood loss anemia   . Steroid-induced hyperglycemia   . Acute on chronic anemia   . Seizures ([place])   . Spastic hemiparesis ([place])   . 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 Hypertension   . Thrombocytopenia ([place])   . Acute unilateral cerebral infarction in a watershed distribution [facility]) 01/15/2019  . Weakness   . CVA (cerebral vascular accident) ([place]) 01/12/2019  . Malnutrition of moderate degree 07/31/2018  . Elevated serum protein level   . Multiple myeloma ([place])   . Pancytopenia ([place])   . Normocytic normochromic anemia 07/16/2018  . Acute kidney failure ([place]) 07/16/2018  . NICM (nonischemic cardiomyopathy) ([place]) 11/12/2013  . History of ETOH abuse 11/12/2013  . Chronic combined systolic and diastolic CHF (congestive heart failure) ([place]) 11/12/2013  . Congestive dilated cardiomyopathy ([place]) 11/04/2013  . Malignant hypertension     Electa Sniff, PT, DPT, NCS 06/09/2019, 10:43 AM  [facility] [address] [place], [place], [address] Phone: [phone]   Fax:  [phone]  Name: Jeremy Grant MRN: [id number] Date of Birth: 1960-09-15

## 2019-06-11 ENCOUNTER — Ambulatory Visit: Payer: BC Managed Care – PPO

## 2019-06-11 ENCOUNTER — Other Ambulatory Visit: Payer: Self-pay

## 2019-06-11 DIAGNOSIS — R2689 Other abnormalities of gait and mobility: Secondary | ICD-10-CM

## 2019-06-11 DIAGNOSIS — M6281 Muscle weakness (generalized): Secondary | ICD-10-CM | POA: Diagnosis not present

## 2019-06-11 NOTE — Therapy (Signed)
[facility] [address] [place], [place], [address] Phone: [phone]   Fax:  [phone]  Physical Therapy Treatment  Patient Details  Name: Jeremy Grant MRN: [id number] Date of Birth: [birth year] Referring Provider (PT): Dr. Letta Pate   Encounter Date: 06/11/2019  PT End of Session - 06/11/19 0922    Visit Number  31    Number of Visits  45    Date for PT Re-Evaluation  08/31/19    Authorization Type  BCBS    PT Start Time  0925    PT Stop Time  1010    PT Time Calculation (min)  45 min    Equipment Utilized During Treatment  Gait belt   left AFO   Activity Tolerance  Patient tolerated treatment well    Behavior During Therapy  [facility] for tasks assessed/performed       Past Medical History:  Diagnosis Date  . Arthritis   . CHF (congestive heart failure) ([place])   . Combined systolic and diastolic cardiac dysfunction    Echo 11/03/2013 EF 67%, grade 3 diastolic dysfunction  . Hypertension   . NICM (nonischemic cardiomyopathy) ([place])    a. L/RHC (11/05/13): RA: 3, RV 52/5, PA 49/19 (31), PCWP 10, AO 166/93, PA 67%, Fick CO/CI: 5.71/2.97, Lmain: normal, LAD: large, without signficant dz, first diagonal has 20% dz at ostium, LCx: normal, RCA: 30% stenosis at the bifurcation of PDA and PLOM    Past Surgical History:  Procedure Laterality Date  . BIOPSY  01/31/2019   Procedure: BIOPSY;  Surgeon: Otis Brace, MD;  Location: [facility];  Service: Gastroenterology;;  . ESOPHAGOGASTRODUODENOSCOPY N/A 01/31/2019   Procedure: ESOPHAGOGASTRODUODENOSCOPY (EGD);  Surgeon: Otis Brace, MD;  Location[facility] ENDOSCOPY;  Service: Gastroenterology;  Laterality: N/A;  . IR FLUORO GUIDE CV LINE RIGHT  07/25/2018  . IR US GUIDE VASC ACCESS RIGHT  07/25/2018  . LEFT AND RIGHT HEART CATHETERIZATION WITH CORONARY ANGIOGRAM N/A 11/05/2013   Procedure: LEFT AND RIGHT HEART CATHETERIZATION WITH CORONARY ANGIOGRAM;  Surgeon: Peter M  Martinique, MD;  Location[facility] CATH LAB;  Service: Cardiovascular;  Laterality: N/A;  . RIGHT HEART CATHETERIZATION N/A 01/14/2014   Procedure: RIGHT HEART CATH;  Surgeon: Larey Dresser, MD;  Location: [facility] CATH LAB;  Service: Cardiovascular;  Laterality: N/A;    There were no vitals filed for this visit.  Subjective Assessment - 06/11/19 0924    Subjective  Pt reports he has been doing well. Has not noted many issues with ankle since the other day. PT remeasured ankle circumference and was 23.5cm.    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 8/31-9/3 then inpatient rehab 9/3-9/23.PMH: CHF, HTN, hyperlipidemia, glomerulonephritis, pancytopenia/chronic anemia, seizure left-sided post stroke    Patient Stated Goals  Pt wants to be able to walk again.    Currently in Pain?  No/denies                       [facility] Adult PT Treatment/Exercise - 06/11/19 0930      Ambulation/Gait   Ambulation/Gait  Yes    Ambulation/Gait Assistance  5: Supervision    Ambulation Distance (Feet)  230 Feet    Assistive device  Straight cane   left AFO   Gait Pattern  Step-through pattern    Ambulation Surface  Level;Indoor    Stairs  Yes    Stairs Assistance  5: Supervision;4: Min  guard    Stairs Assistance Details (indicate cue type and reason)  Pt was instructed to take his time with foot placement on descent.    Stair Management Technique  Step to pattern   [facility]   Number of Stairs  12    Gait Comments  Pt ambulated another 230' with SPC at end of session with increased step length      Neuro Re-ed    Neuro Re-ed Details   In hall: marching gait with SPC 40' x 2, large steps 40' x 2 with SPC. Over mat: marching with SPC 6' x 6 with verbal cues to control with SLS tightening gluts, side stepping 6' x 6. SLS activities on soccerball: SLS left with right foot on soccerball without UE support 30 sec x 3. Pt was cued to  not squish ball putting less weight through right. CGA for safety. Right foot on soccerball moving front/back x 10 and side to side x 10 with SPC support CGA then repeated with left foot on ball with 1 rail support as more diffiult to control. Kicking soccerball with PT guarding patient CGA/min assist and 2nd person passing ball: Performed 10 reps with having patient stop ball first then kick to each side then had patient have to step to the side to stop ball and kick x 2 min then stepping to side without stopping ball to kick x 2 min. Standing on rockerboard playing catch x 2 min with board positioned ant/post and then latera with PT guarding CGA.             PT Education - 06/11/19 1242    Education Details  Discussed working on alternating toe taps on plastic cup at home holding to counter and SPC.    Person(s) Educated  Patient;Spouse    Methods  Explanation;Demonstration    Comprehension  Verbalized understanding       PT Short Term Goals - 06/02/19 1818      PT SHORT TERM GOAL #1   Title  Pt will ambulate >400' on varied surfaces with SPC supervision for improved community mobility. (target date for STGs=07/02/19)    Time  4    Period  Weeks    Status  New    Target Date  07/02/19      PT SHORT TERM GOAL #2   Title  Pt will increase gait speed from 0.3ms to >0.844m with SPC for improved gait safety.    Baseline  06/02/2019 comfortable =0.6654mand fast=0.27m/78m  Time  4    Period  Weeks    Status  On-going    Target Date  07/02/19      PT SHORT TERM GOAL #3   Title  Pt will be able to perform sit to stand without UE support from standard chair consistently for improved functional strength.    Time  4    Period  Weeks    Status  On-going    Target Date  07/02/19        PT Long Term Goals - 06/02/19 1823      PT LONG TERM GOAL #1   Title  Pt will ambulate > 500' on varied surfaces with SPC mod I for improved community mobility.(Target date fo LTGs=08/01/19)     Baseline  supervision on level and supervision/CGA on nonlevel    Time  8    Period  Weeks    Status  On-going    Target Date  08/01/19  PT LONG TERM GOAL #2   Title  Pt will increase Berg from 41/56 to > 48/56 for improved balance and decreased fall risk.    Baseline  45/56 on 06/02/19    Time  8    Period  Weeks    Status  On-going    Target Date  08/01/19      PT LONG TERM GOAL #3   Title  Pt will increase DGI from 16/21 to 19/21 for improved balance and gait safety.    Baseline  16/21 on 06/02/19    Time  8    Period  Weeks    Status  New    Target Date  08/01/19            Plan - 06/11/19 1244    Clinical Impression Statement  Pt showed good safety awareness throughout session stopping on dynamic tasks if felt unsafe. PT worked on [employer] and coordination activities for left LE. Was able to show improved control on left leg with descent on stairs today after.    Personal Factors and Comorbidities  Comorbidity 3+    Comorbidities  CHF, HTN, hyperlipidemia, glomerulonephritis, pancytopenia/chronic anemia, seizure    Examination-Activity Limitations  Stairs;Stand;Squat;Locomotion Level;Transfers;Bed Mobility    Examination-Participation Restrictions  Community Activity;Shop;Driving;Cleaning;Yard Work    [employer]chant [employer] officer  Evolving/Moderate complexity    Rehab Potential  Good    PT Frequency  2x / week    PT Duration  8 weeks    PT Treatment/Interventions  DME Instruction;ADLs/Self Care Home Management;Gait training;Stair training;Functional mobility training;Therapeutic activities;Therapeutic exercise;Balance training;Orthotic Fit/Training;Patient/family education;Neuromuscular re-education;Wheelchair mobility training;Passive range of motion;Manual techniques    PT Next Visit Plan  Continue gait with SPC adding in dynamic gait activities. Gait on nonlevel surfaces with SPC. Standing balance with more SLS activities and strengthening to left LE.    PT Home  Exercise Plan  Access Code: [email or web address]    Consulted and Agree with Plan of Care  Patient    Family Member Consulted  wife       Patient will benefit from skilled therapeutic intervention in order to improve the following deficits and impairments:  Abnormal gait, Difficulty walking, Decreased balance, Decreased mobility, Decreased strength, Decreased knowledge of use of DME, Decreased endurance, Decreased activity tolerance, Decreased range of motion, Pain, Increased muscle spasms  Visit Diagnosis: Other abnormalities of gait and mobility  Muscle weakness (generalized)     Problem List Patient Active Problem List   Diagnosis Date Noted  . Essential hypertension 05/25/2019  . 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 Acute blood loss anemia   . Steroid-induced hyperglycemia   . Acute on chronic anemia   . Seizures ([place])   . Spastic hemiparesis ([place])   . 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 Hypertension   . Thrombocytopenia ([place])   . Acute unilateral cerebral infarction in a watershed distribution [facility]) 01/15/2019  . Weakness   . CVA (cerebral vascular accident) ([place]) 01/12/2019  . Malnutrition of moderate degree 07/31/2018  . Elevated serum protein level   . Multiple myeloma ([place])   . Pancytopenia ([place])   . Normocytic normochromic anemia 07/16/2018  . Acute kidney failure ([place]) 07/16/2018  . NICM (nonischemic cardiomyopathy) ([place]) 11/12/2013  . History of ETOH abuse 11/12/2013  . Chronic combined systolic and diastolic CHF (congestive heart failure) ([place]) 11/12/2013  . Congestive dilated cardiomyopathy ([place]) 11/04/2013  . Malignant hypertension     Electa Sniff, PT, DPT, NCS 06/11/2019, 12:46 PM  [facility]  [address] [place], [place], [address] Phone: [phone]   Fax:  [phone]  Name: Jeremy Grant MRN: [id number] Date of Birth: 02-Jul-1960

## 2019-06-15 ENCOUNTER — Ambulatory Visit: Payer: BC Managed Care – PPO | Attending: Physical Medicine & Rehabilitation

## 2019-06-15 ENCOUNTER — Other Ambulatory Visit: Payer: Self-pay

## 2019-06-15 DIAGNOSIS — R2689 Other abnormalities of gait and mobility: Secondary | ICD-10-CM

## 2019-06-15 DIAGNOSIS — M6281 Muscle weakness (generalized): Secondary | ICD-10-CM | POA: Diagnosis present

## 2019-06-15 NOTE — Therapy (Signed)
[facility] [address] [place], [place], [address] Phone: [phone]   Fax:  [phone]  Physical Therapy Treatment  Patient Details  Name: Jeremy Grant MRN: [id number] Date of Birth: [birth year] Referring Provider (PT): Dr. Letta Pate   Encounter Date: 06/15/2019  PT End of Session - 06/15/19 0802    Visit Number  32    Number of Visits  45    Date for PT Re-Evaluation  08/31/19    Authorization Type  BCBS    PT Start Time  0800    PT Stop Time  0842    PT Time Calculation (min)  42 min    Equipment Utilized During Treatment  Gait belt   left AFO   Activity Tolerance  Patient tolerated treatment well    Behavior During Therapy  [facility] for tasks assessed/performed       Past Medical History:  Diagnosis Date  . Arthritis   . CHF (congestive heart failure) ([place])   . Combined systolic and diastolic cardiac dysfunction    Echo 11/03/2013 EF 35%, grade 3 diastolic dysfunction  . Hypertension   . NICM (nonischemic cardiomyopathy) ([place])    a. L/RHC (11/05/13): RA: 3, RV 52/5, PA 49/19 (31), PCWP 10, AO 166/93, PA 67%, Fick CO/CI: 5.71/2.97, Lmain: normal, LAD: large, without signficant dz, first diagonal has 20% dz at ostium, LCx: normal, RCA: 30% stenosis at the bifurcation of PDA and PLOM    Past Surgical History:  Procedure Laterality Date  . BIOPSY  01/31/2019   Procedure: BIOPSY;  Surgeon: Otis Brace, MD;  Location: [facility];  Service: Gastroenterology;;  . ESOPHAGOGASTRODUODENOSCOPY N/A 01/31/2019   Procedure: ESOPHAGOGASTRODUODENOSCOPY (EGD);  Surgeon: Otis Brace, MD;  Location: [facility] ENDOSCOPY;  Service: Gastroenterology;  Laterality: N/A;  . IR FLUORO GUIDE CV LINE RIGHT  07/25/2018  . IR US GUIDE VASC ACCESS RIGHT  07/25/2018  . LEFT AND RIGHT HEART CATHETERIZATION WITH CORONARY ANGIOGRAM N/A 11/05/2013   Procedure: LEFT AND RIGHT HEART CATHETERIZATION WITH CORONARY ANGIOGRAM;  Surgeon: Peter M  Martinique, MD;  Location: [facility] CATH LAB;  Service: Cardiovascular;  Laterality: N/A;  . RIGHT HEART CATHETERIZATION N/A 01/14/2014   Procedure: RIGHT HEART CATH;  Surgeon: Larey Dresser, MD;  Location: [facility] CATH LAB;  Service: Cardiovascular;  Laterality: N/A;    There were no vitals filed for this visit.  Subjective Assessment - 06/15/19 0802    Subjective  Pt denies any new concerns. Walked in on cane today.    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 8/31-9/3 then inpatient rehab 9/3-9/23.PMH: CHF, HTN, hyperlipidemia, glomerulonephritis, pancytopenia/chronic anemia, seizure left-sided post stroke    Patient Stated Goals  Pt wants to be able to walk again.    Currently in Pain?  No/denies                       [facility] Adult PT Treatment/Exercise - 06/15/19 0802      Transfers   Transfers  Sit to Stand;Stand to Sit    Sit to Stand  6: Modified independent (Device/Increase time)    Stand to Sit  6: Modified independent (Device/Increase time)      Ambulation/Gait   Ambulation/Gait  Yes    Ambulation/Gait Assistance  5: Supervision    Ambulation/Gait Assistance Details  Pt performed 115'x 2 normal gait, then 115' x 2 marching then large steps     Ambulation  Distance (Feet)  690 Feet    Assistive device  Straight cane    Ambulation Surface  Level;Indoor    Stairs  Yes    Stairs Assistance  5: Supervision;4: Min guard    Stairs Assistance Details (indicate cue type and reason)  Pt was cued to focus on foot placement. [place] left foot on last step coming up and then some difficulty with placement on initial bout down 4 steps needing CGA/min assist    Stair Management Technique  Step to pattern   [facility]   Number of Stairs  12      Neuro Re-ed    Neuro Re-ed Details   Gait over mat marching x 6' then tapping 2 cones with left foot then reciprocal steps over cones then large steps on to dots on  floor all with SPC CGA x 6 laps. Then repeated course with side stepping over mat and over cones x 3 laps. Pt has decreased control with clearing left foot side stepping over cone. In // bars: lateral step-ups on 6" step with 1 UE support x 10 each leg then step down to touch dot x 10 each leg with 1 UE  support. PT stabilizing at left knee with lowering with left. Standing on airex tapping 6" step with right foot x 10 with fingertip support then without support x 10. Then performed alternating x 10.  Pt had decreased time spent on left LE with quick right step. Was cued to try to control more.      Exercises   Exercises  Other Exercises    Other Exercises   Sidelying for left clamshell 10 x 2, bridges with manual resistance x 10 at thighs to get more hip abductor activation then x 10 with green theraband, hooklying left hip abd/ER with green theraband x 10.             PT Education - 06/15/19 0851    Education Details  Pt instructed to add band around thighs with bridges for more hip abductor activation. Issued green theraband    Person(s) Educated  Patient;Spouse    Methods  Explanation;Demonstration    Comprehension  Verbalized understanding       PT Short Term Goals - 06/02/19 1818      PT SHORT TERM GOAL #1   Title  Pt will ambulate >400' on varied surfaces with SPC supervision for improved community mobility. (target date for STGs=07/02/19)    Time  4    Period  Weeks    Status  New    Target Date  07/02/19      PT SHORT TERM GOAL #2   Title  Pt will increase gait speed from 0.52ms to >0.866m with SPC for improved gait safety.    Baseline  06/02/2019 comfortable =0.661mand fast=0.27m/58m  Time  4    Period  Weeks    Status  On-going    Target Date  07/02/19      PT SHORT TERM GOAL #3   Title  Pt will be able to perform sit to stand without UE support from standard chair consistently for improved functional strength.    Time  4    Period  Weeks    Status  On-going     Target Date  07/02/19        PT Long Term Goals - 06/02/19 1823      PT LONG TERM GOAL #1   Title  Pt will ambulate > 500'  on varied surfaces with SPC mod I for improved community mobility.(Target date fo LTGs=08/01/19)    Baseline  supervision on level and supervision/CGA on nonlevel    Time  8    Period  Weeks    Status  On-going    Target Date  08/01/19      PT LONG TERM GOAL #2   Title  Pt will increase Berg from 41/56 to > 48/56 for improved balance and decreased fall risk.    Baseline  45/56 on 06/02/19    Time  8    Period  Weeks    Status  On-going    Target Date  08/01/19      PT LONG TERM GOAL #3   Title  Pt will increase DGI from 16/21 to 19/21 for improved balance and gait safety.    Baseline  16/21 on 06/02/19    Time  8    Period  Weeks    Status  New    Target Date  08/01/19            Plan - 06/15/19 2440    Clinical Impression Statement  PT focused more on glut med activation to help with control during stance time on left LE. Pt was able to improve weight shift some after practice.    Personal Factors and Comorbidities  Comorbidity 3+    Comorbidities  CHF, HTN, hyperlipidemia, glomerulonephritis, pancytopenia/chronic anemia, seizure    Examination-Activity Limitations  Stairs;Stand;Squat;Locomotion Level;Transfers;Bed Mobility    Examination-Participation Restrictions  Community Activity;Shop;Driving;Cleaning;Yard Work    [employer]chant [employer] officer  Evolving/Moderate complexity    Rehab Potential  Good    PT Frequency  2x / week    PT Duration  8 weeks    PT Treatment/Interventions  DME Instruction;ADLs/Self Care Home Management;Gait training;Stair training;Functional mobility training;Therapeutic activities;Therapeutic exercise;Balance training;Orthotic Fit/Training;Patient/family education;Neuromuscular re-education;Wheelchair mobility training;Passive range of motion;Manual techniques    PT Next Visit Plan  Continue gait with SPC adding in  dynamic gait activities. Gait on nonlevel surfaces with SPC. Standing balance with more SLS activities and strengthening to left LE with more focus on glut med.    PT Home Exercise Plan  Access Code: [email or web address]    Consulted and Agree with Plan of Care  Patient    Family Member Consulted  wife       Patient will benefit from skilled therapeutic intervention in order to improve the following deficits and impairments:  Abnormal gait, Difficulty walking, Decreased balance, Decreased mobility, Decreased strength, Decreased knowledge of use of DME, Decreased endurance, Decreased activity tolerance, Decreased range of motion, Pain, Increased muscle spasms  Visit Diagnosis: Other abnormalities of gait and mobility  Muscle weakness (generalized)     Problem List Patient Active Problem List   Diagnosis Date Noted  . Essential hypertension 05/25/2019  . 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 Acute blood loss anemia   . Steroid-induced hyperglycemia   . Acute on chronic anemia   . Seizures ([place])   . Spastic hemiparesis ([place])   . 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 Hypertension   . Thrombocytopenia ([place])   . Acute unilateral cerebral infarction in a watershed distribution [facility]) 01/15/2019  . Weakness   . CVA (cerebral vascular accident) ([place]) 01/12/2019  . Malnutrition of moderate degree 07/31/2018  . Elevated serum protein level   . Multiple myeloma ([place])   . Pancytopenia ([place])   . Normocytic normochromic anemia 07/16/2018  . Acute kidney failure ([place]) 07/16/2018  . NICM (nonischemic cardiomyopathy) ([place]) 11/12/2013  .  History of ETOH abuse 11/12/2013  . Chronic combined systolic and diastolic CHF (congestive heart failure) ([place]) 11/12/2013  . Congestive dilated cardiomyopathy ([place]) 11/04/2013  . Malignant hypertension     Electa Sniff, PT, DPT, NCS 06/15/2019, 8:54 AM  [facility] [address] [place], [place], [address] Phone:  [phone]   Fax:  [phone]  Name: Jeremy Grant MRN: [id number] Date of Birth: 08/17/1960

## 2019-06-16 ENCOUNTER — Telehealth: Payer: Self-pay | Admitting: Adult Health

## 2019-06-16 NOTE — Telephone Encounter (Signed)
I called wife back, she is here with pt when he came last time.  He is doing well.  Still receiveing [facility] from [facility].  No seizures since last one when in hospital 9/20.  Asking about him driving?  Taking depakote 500mg  po BID and levetiracetam 500mg  po bid.  Doing fine.  Instructed to sign DPR for her [name], when in on Thursday.

## 2019-06-16 NOTE — Telephone Encounter (Signed)
He is not able to return to driving until 6 months post seizure activity which would be in March.  Unable to fully determine return to driving from a stroke standpoint as I have not seen him since 02/2019 and he has ongoing residual deficits.  This can be further discussed at follow-up visit along with discussion with current therapy regarding safety concerns of potentially returning to drive.

## 2019-06-16 NOTE — Telephone Encounter (Signed)
Patients wife [name] called in ( not on [place]) wanting to know if patient is able to begin driving, he hasn't had any seizures since his hospitalization in September.

## 2019-06-18 ENCOUNTER — Other Ambulatory Visit: Payer: Self-pay

## 2019-06-18 ENCOUNTER — Encounter: Payer: Self-pay | Admitting: Adult Health

## 2019-06-18 ENCOUNTER — Ambulatory Visit (INDEPENDENT_AMBULATORY_CARE_PROVIDER_SITE_OTHER): Payer: BC Managed Care – PPO | Admitting: Adult Health

## 2019-06-18 VITALS — BP 138/80 | HR 67 | Temp 97.7°F | Ht 73.0 in | Wt 158.0 lb

## 2019-06-18 DIAGNOSIS — I69354 Hemiplegia and hemiparesis following cerebral infarction affecting left non-dominant side: Secondary | ICD-10-CM

## 2019-06-18 DIAGNOSIS — I639 Cerebral infarction, unspecified: Secondary | ICD-10-CM

## 2019-06-18 DIAGNOSIS — Z79899 Other long term (current) drug therapy: Secondary | ICD-10-CM

## 2019-06-18 DIAGNOSIS — E785 Hyperlipidemia, unspecified: Secondary | ICD-10-CM

## 2019-06-18 DIAGNOSIS — Z8673 Personal history of transient ischemic attack (TIA), and cerebral infarction without residual deficits: Secondary | ICD-10-CM

## 2019-06-18 DIAGNOSIS — D696 Thrombocytopenia, unspecified: Secondary | ICD-10-CM

## 2019-06-18 DIAGNOSIS — I10 Essential (primary) hypertension: Secondary | ICD-10-CM

## 2019-06-18 DIAGNOSIS — R569 Unspecified convulsions: Secondary | ICD-10-CM

## 2019-06-18 MED ORDER — LEVETIRACETAM 500 MG PO TABS: 500.0000 mg | ORAL_TABLET | Freq: Two times a day (BID) | ORAL | 3 refills | Status: DC

## 2019-06-18 MED ORDER — DIVALPROEX SODIUM 500 MG PO DR TAB: 500.0000 mg | DELAYED_RELEASE_TABLET | Freq: Two times a day (BID) | ORAL | 3 refills | Status: DC

## 2019-06-18 NOTE — Progress Notes (Signed)
[facility] [address] [place]. [place] [address] [phone]       STROKE FOLLOW UP NOTE  [name] Date of Birth:  09-03-1960 Medical Record Number:  [id number]   Reason for Referral: stroke follow up    CHIEF COMPLAINT:  Chief Complaint  Patient presents with  . Follow-up    RM9. with wife. No changes nor concerns. Physical rehab is helping. Left ankle swelling.     HPI:  Stroke admission 01/12/2019: [name] is a 59 y.o. male with history of nonischemic cardiomyopathy, hypertension, combination of systolic and diastolic cardiac dysfunction, congestive heart failure and arthritis presented on 01/12/2019 with L leg weakness.  Stroke work-up showed patchy right corpus callosum infarct with possible watershed secondary to unknown source.  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  MRA showed right ICA stenosis and pericallosal region (questionable emboli).  Carotid Dopplers show bilateral ICA 1 to 39% stenosis in VAs antegrade.  2D echo EF of 55% without cardiac source of embolus identified.  TCD no evidence of PFO.  Given pancytopenia, he is not a good AC candidate therefore TEE/loop not considered at that time.  Recommended 30-day cardiac event monitor outpatient.  LDL 122.  A1c 5.4.  Initiated DAPT for 3 weeks and aspirin alone.  HTN stable.  Initiated atorvastatin 80 mg daily for HLD and secondary stroke prevention.  No evidence or history of DM.  Other stroke risk factors include EtOH use, CAD, systolic and diastolic CHF, nonischemic cardiomyopathy and prior stroke on imaging.  Other active problems include glomerulonephritis, pancytopenia, shingles in May with postherpetic neuralgia now resolved as well as lumbar spinal disease.  Residual deficits of left hemiparesis and  discharged to CIR for ongoing therapies on 01/15/2019.  During CIR admission, Plavix discontinued as well as Lovenox due to excessive bruising with bouts of anemia requiring transfusions.  Underwent EGD on 01/31/2019 with no active bleeding but did show nonbleeding distal esophageal ulcer as well as erosive gastropathy and recommended follow-up with GI outpatient.  He also had bouts of seizures with follow-up EEG negative and initiated Depakote 1000 mg twice daily and Keppra 500 mg twice daily.  He was discharged home with recommendation of outpatient therapies on 02/04/2019.  Initial visit 03/03/2019: Jeremy Grant is a 59 year old male who is being seen today for hospital follow-up.  Residual deficits of spastic left hemiparesis and continues to work with outpatient PT with reports of improvement.  He is able to ambulate with a cane but will use wheelchair for long distance.  He will occasionally have LLE spasms and continues on tizanidine with benefit.  No reoccurring seizure activity with ongoing use of Depakote 1000 mg twice daily and Keppra 500 mg twice daily.  Tolerating well without side effects.  Continues on aspirin 81 mg with mild bruising but no bleeding.  Continues on atorvastatin 80 mg without myalgias.  Blood pressure today 124/72.  He continues to follow with Dr. Hollie Salk at [facility] with recent visit last week and initiated carvedilol due to continued elevated blood pressures.  He does not routinely monitor at home.  30-day cardiac event ordered but has not been completed at this time.  No further concerns at this time.  Update 06/18/2019: Jeremy Grant is a 59 year old male who is being seen today for stroke follow-up accompanied by his wife.  Residual stroke deficits left sided weakness but greatly improving with ongoing participation of physical therapy.  Improvement of prior spasticity with ongoing use of tizanidine nightly managed by physical medicine and rehab Dr. Posey Pronto.  he is now  ambulatory with a cane and denies any recent falls.  He continues to use AFO brace when he leaves the house.  After prior visit, discussion with nephrologist  Dr. Hollie Salk regarding possibly decreasing Depakote dosage due to thrombocytopenia.  Depakote dosage decreased and currently on 500 mg twice daily with patient/wife reporting improvement of lab work.  He does have follow-up with nephrologist next week.  He is also continued on Keppra 500 mg twice daily.  Denies any recent seizure activities.  Only possible side effects includes upper extremity mild tremors which she has not had previously but denies interference with ADLs.  His wife questions possible return to driving.  Continues on aspirin 81 mg daily and atorvastatin 80 mg daily for secondary stroke prevention without side effects.  Blood pressure today initially elevated at 181/88 but on recheck 138/80.  He does monitor at home and does endorse whitecoat syndrome and typically blood pressure at home typically 1 30-1 40/60 to 70s.  Denies new or worsening stroke/TIA symptoms.       ROS:   14 system review of systems performed and negative with exception of weakness  PMH:  Past Medical History:  Diagnosis Date  . Arthritis   . CHF (congestive heart failure) ([place])   . Combined systolic and diastolic cardiac dysfunction    Echo 11/03/2013 EF 25%, grade 3 diastolic dysfunction  . Hypertension   . NICM (nonischemic cardiomyopathy) ([place])    a. L/RHC (11/05/13): RA: 3, RV 52/5, PA 49/19 (31), PCWP 10, AO 166/93, PA 67%, Fick CO/CI: 5.71/2.97, Lmain: normal, LAD: large, without signficant dz, first diagonal has 20% dz at ostium, LCx: normal, RCA: 30% stenosis at the bifurcation of PDA and PLOM    PSH:  Past Surgical History:  Procedure Laterality Date  . BIOPSY  01/31/2019   Procedure: BIOPSY;  Surgeon: Otis Brace, MD;  Location: [facility];  Service: Gastroenterology;;  . ESOPHAGOGASTRODUODENOSCOPY N/A 01/31/2019   Procedure:  ESOPHAGOGASTRODUODENOSCOPY (EGD);  Surgeon: Otis Brace, MD;  Location: [facility] ENDOSCOPY;  Service: Gastroenterology;  Laterality: N/A;  . IR FLUORO GUIDE CV LINE RIGHT  07/25/2018  . IR US GUIDE VASC ACCESS RIGHT  07/25/2018  . LEFT AND RIGHT HEART CATHETERIZATION WITH CORONARY ANGIOGRAM N/A 11/05/2013   Procedure: LEFT AND RIGHT HEART CATHETERIZATION WITH CORONARY ANGIOGRAM;  Surgeon: Peter M Martinique, MD;  Location: [facility] CATH LAB;  Service: Cardiovascular;  Laterality: N/A;  . RIGHT HEART CATHETERIZATION N/A 01/14/2014   Procedure: RIGHT HEART CATH;  Surgeon: Larey Dresser, MD;  Location: [facility] CATH LAB;  Service: Cardiovascular;  Laterality: N/A;    Social History:  Social History   Socioeconomic History  . Marital status: Married    Spouse name: BRENDA  . Number of children: 0  . Years of education: 23  . Highest education level: 12th grade  Occupational History  . Occupation: Medical sales representative  Tobacco Use  . Smoking status: Never Smoker  . Smokeless tobacco: Never Used  Substance and Sexual Activity  . Alcohol use: Yes    Alcohol/week: 3.0 standard drinks    Types: 3 Glasses of wine per week  . Drug use: No  . Sexual activity: Not Currently    Birth control/protection: None  Other Topics Concern  . Not on file  Social  History Narrative  . Not on file   Social Determinants of Health   Financial Resource Strain:   . Difficulty of Paying Living Expenses: Not on file  Food Insecurity: No Food Insecurity  . Worried About Charity fundraiser in the Last Year: Never true  . Ran Out of Food in the Last Year: Never true  Transportation Needs: No Transportation Needs  . Lack of Transportation (Medical): No  . Lack of Transportation (Non-Medical): No  Physical Activity: Sufficiently Active  . Days of Exercise per Week: 5 days  . Minutes of Exercise per Session: 30 min  Stress: No Stress Concern Present  . Feeling of Stress : Not at all  Social Connections: Somewhat Isolated  .  Frequency of Communication with Friends and Family: More than three times a week  . Frequency of Social Gatherings with Friends and Family: Twice a week  . Attends Religious Services: Never  . Active Member of Clubs or Organizations: No  . Attends Archivist Meetings: Never  . Marital Status: Married  Human resources officer Violence: Not At Risk  . Fear of Current or Ex-Partner: No  . Emotionally Abused: No  . Physically Abused: No  . Sexually Abused: No    Family History:  Family History  Problem Relation Age of Onset  . Heart attack Father   . Heart disease Father   . Arrhythmia Father   . Hypertension Brother   . Hypertension Brother     Medications:   Current Outpatient Medications on File Prior to Visit  Medication Sig Dispense Refill  . acetaminophen (TYLENOL) 325 MG tablet Take 2 tablets (650 mg total) by mouth every 6 (six) hours as needed for moderate pain.    Marland Kitchen amLODipine (NORVASC) 10 MG tablet Take 1 tablet (10 mg total) by mouth daily. 30 tablet 1  . aspirin EC 81 MG EC tablet Take 1 tablet (81 mg total) by mouth daily.    Marland Kitchen azaTHIOprine (IMURAN) 50 MG tablet Take 1 tablet (50 mg total) by mouth daily. 30 tablet 1  . carvedilol (COREG) 6.25 MG tablet Take 12.5 mg by mouth 2 (two) times daily with a meal. Does increased to 12.5mg , 2x/day    . pantoprazole (PROTONIX) 40 MG tablet Take 1 tablet (40 mg total) by mouth daily. 30 tablet 0  . predniSONE (DELTASONE) 5 MG tablet Take 5 mg by mouth daily with breakfast.    . tiZANidine (ZANAFLEX) 4 MG tablet Take 1 tablet (4 mg total) by mouth every 8 (eight) hours as needed for muscle spasms. 90 tablet 1  . atorvastatin (LIPITOR) 80 MG tablet Take 1 tablet (80 mg total) by mouth daily at 6 PM. 30 tablet 0   No current facility-administered medications on file prior to visit.    Allergies:   Allergies  Allergen Reactions  . Hydralazine Hcl      Physical Exam  Vitals:   06/18/19 0920 06/18/19 0946  BP: (!)  181/88 138/80  Pulse: 67   Temp: 97.7 F (36.5 C)   Weight: 158 lb (71.7 kg)   Height: 6\' 1"  (1.854 m)    Body mass index is 20.85 kg/m. No exam data present  General: well developed, well nourished,  pleasant middle-age Caucasian male, seated, in no evident distress Head: head normocephalic and atraumatic.   Neck: supple with no carotid or supraclavicular bruits Cardiovascular: regular rate and rhythm, no murmurs Musculoskeletal: no deformity Skin:  no rash/petichiae Vascular:  Normal pulses all extremities  Neurologic Exam Mental Status: Awake and fully alert.  Normal speech and language. Oriented to place and time. Recent and remote memory intact. Attention span, concentration and fund of knowledge appropriate. Mood and affect appropriate.  Cranial Nerves: Pupils equal, briskly reactive to light. Extraocular movements full without nystagmus. Visual fields full to confrontation. Hearing intact. Facial sensation intact.  Very mild left lower facial weakness Motor:  LUE: 5/5 with very mild decreased hand dexterity LLE: 4/5 with mildly weak hip flexor and weak ankle dorsiflexion and AFO in place  Full strength right upper and lower extremity Sensory.: intact to touch , pinprick , position and vibratory sensation.  Coordination: Rapid alternating movements slightly diminished left hand. Finger-to-nose and heel-to-shin performed accurately bilaterally. Gait and Station: Able to stand without assistance or difficulty.  Ambulates with a slightly stiffened left leg taking normal stride length and no imbalance concerns with use of cane. Reflexes: 1+ and symmetric. Toes downgoing.       Diagnostic Data (Labs, Imaging, Testing)  MR BRAIN WO CONTRAST 01/12/2019 IMPRESSION: Patchy areas of acute infarct in the deep white matter on the right including the corpus callosum. Possible watershed type infarct. Chronic microvascular ischemic changes in the white matter.  MR LUMBAR SPINE WO  CONTRAST 01/12/2019 IMPRESSION: 1. Grade 1 anterolisthesis of L4 on L5. 2. Lumbar spine spondylosis most notable at L4-L5 with a central disc protrusion contacting the bilateral descending L5 nerve roots and large bulky right-sided osteophytes which causes effacement of the thecal sac. There is moderate bilateral neural foraminal narrowing and mild central canal stenosis at this level. 3. Also at L5-S1 there is a central disc protrusion with annular fissure and the disc protrusion contacts the right S1 nerve root.  MR MRA HEAD  01/12/2019 IMPRESSION: Abnormal appearance of the right anterior cerebral artery in the pericallosal region most likely representing stenoses secondary to emboli.  ECHOCARDIOGRAM 01/12/2019 IMPRESSIONS  1. The left ventricle has a visually estimated ejection fraction of 55%. The cavity size was normal. There is mildly increased left ventricular wall thickness. Left ventricular diastolic Doppler parameters are consistent with impaired relaxation.  2. The aortic valve is tricuspid. Mild calcification of the aortic valve. No stenosis of the aortic valve.  3. The aortic root is normal in size and structure.  4. The right ventricle has normal systolic function. The cavity was normal. There is no increase in right ventricular wall thickness.  5. No evidence of mitral valve stenosis. No significant mitral regurgitation.  6. Normal IVC size. No complete TR doppler jet so unable to estimate PA systolic pressure. =]Normal  EEG adult 01/21/2019 IMPRESSION: This study is within normal limits. However, only wakefulness and drowsiness were recorded. If suspicion for interictal activity remains a concern, a prolonged study including sleep should be considered.  No seizures or epileptiform discharges were seen throughout the recording.       ASSESSMENT: Jeremy Grant is a 59 y.o. 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  MRI also showed evidence of previous small subacute left frontal and chronic infarcts likely embolic etiology.  Recent shingles which shingles vasculitis consideration but left-sided subacute on chronic infarcts predated his onset of shingles hence unlikely to be caused.  Chronic pancytopenia with low platelet count and anemia and likely not a good candidate for anticoagulation.  Possible consideration of 30-day cardiac event monitor outpatient.  During CIR admission, evidence of partial seizure activity  and initiated Depakote and Keppra for seizure prophylaxis.  Vascular risk factors include HTN, HLD, EtOH use, CAD, systolic and diastolic CHF, nonischemic cardiomyopathy, pancytopenia and prior stroke on imaging.  Residual deficits of mild decreased left hand dexterity and mild LLE weakness but overall greatly improving.  He continues on Keppra 500 mg twice daily and Depakote 500 mg twice daily without recent seizure activity.  Depakote dosage decreased by request of nephrologist due to thrombocytopenia    PLAN:  1. Right CR infarct: Continue aspirin 81 mg daily  and atorvastatin for secondary stroke prevention. Maintain strict control of hypertension with blood pressure goal below 130/90, diabetes with hemoglobin A1c goal below 6.5% and cholesterol with LDL cholesterol (bad cholesterol) goal below 70 mg/dL.  I also advised the patient to eat a healthy diet with plenty of whole grains, cereals, fruits and vegetables, exercise regularly with at least 30 minutes of continuous activity daily and maintain ideal body weight.   2. HTN: Advised to continue current treatment regimen.  Today's BP stable.  Advised to continue to monitor at home along with continued follow-up with PCP for management 3. HLD: Advised to continue current treatment regimen along with continued follow-up with PCP for future prescribing and monitoring of lipid panel.  Obtain lipid  panel as this has not been completed since hospitalization to ensure satisfactory management with ongoing statin use 4. Seizures: Continue Depakote 500 mg twice daily and Keppra 500 mg twice daily.  Obtain Depakote level.  Discussion regarding continued use of seizure prophylaxis medication to prevent further events.  Will repeat EEG for follow-up study from hospitalization.  Advised to refrain from driving for an additional month as at that time you will be 6 months out from prior seizure activity.  No further concerns regarding return to driving based on residual stroke deficits 5. Mild left hemiparesis, poststroke: Continue to work with outpatient physical therapy with ongoing improvement and use of cane for fall prevention.  Continue to follow with physical medicine and rehab Dr. Posey Pronto with ongoing use of tizanidine for prior spasticity.   Follow up in 4 months or call earlier if needed   Greater than 50% of time during this 30 minute visit was spent on counseling, explanation of diagnosis of right CR infarct, reviewing risk factor management of HTN, HLD, discussion regarding seizures post stroke with ongoing use of AED and indication for repeat EEG and lab work and discussion regarding residual deficits, planning of further management along with potential future management, and discussion with patient and family answering all questions.    Frann Rider, AGNP-BC  [facility] [address] [place], [place] [address]  Phone [phone] Fax [phone] Note: This document was prepared with digital dictation and possible smart phrase technology. Any transcriptional errors that result from this process are unintentional.

## 2019-06-18 NOTE — Patient Instructions (Signed)
Continue aspirin 81 mg daily  and lipitor  for secondary stroke prevention  Continue to follow up with PCP regarding cholesterol and blood pressure management   Continue depakote 500mg  twice daily and keppra 500mg  twice daily for seizure prophylaxis  We will repeat EEG as well as obtain Depakote levels   Continue to monitor blood pressure at home  Maintain strict control of hypertension with blood pressure goal below 130/90, diabetes with hemoglobin A1c goal below 6.5% and cholesterol with LDL cholesterol (bad cholesterol) goal below 70 mg/dL. I also advised the patient to eat a healthy diet with plenty of whole grains, cereals, fruits and vegetables, exercise regularly and maintain ideal body weight.  Followup in the future with me in 4 months or call earlier if needed       Thank you for coming to see [place] at [facility]. I hope we have been able to provide you high quality care today.  You may receive a patient satisfaction survey over the next few weeks. We would appreciate your feedback and comments so that we may continue to improve ourselves and the health of our patients.

## 2019-06-18 NOTE — Progress Notes (Signed)
I agree with the above plan 

## 2019-06-19 ENCOUNTER — Other Ambulatory Visit: Payer: Self-pay

## 2019-06-19 ENCOUNTER — Ambulatory Visit: Payer: BC Managed Care – PPO

## 2019-06-19 DIAGNOSIS — R2689 Other abnormalities of gait and mobility: Secondary | ICD-10-CM | POA: Diagnosis not present

## 2019-06-19 DIAGNOSIS — M6281 Muscle weakness (generalized): Secondary | ICD-10-CM

## 2019-06-19 LAB — LIPID PANEL
Chol/HDL Ratio: 3.2 ratio (ref 0.0–5.0)
Cholesterol, Total: 119 mg/dL (ref 100–199)
HDL: 37 mg/dL — ABNORMAL LOW (ref >39)
LDL Chol Calc (NIH): 59 mg/dL (ref 0–99)
Triglycerides: 127 mg/dL (ref 0–149)
VLDL Cholesterol Cal: 23 mg/dL (ref 5–40)

## 2019-06-19 LAB — VALPROIC ACID LEVEL: Valproic Acid Lvl: 64 ug/mL (ref 50–100)

## 2019-06-19 NOTE — Therapy (Signed)
[facility] [address] [place], [place], [address] Phone: [phone]   Fax:  [phone]  Physical Therapy Treatment  Patient Details  Name: Jeremy Grant MRN: [id number] Date of Birth: [birth year] Referring Provider (PT): Dr. Letta Pate   Encounter Date: 06/19/2019  PT End of Session - 06/19/19 0805    Visit Number  33    Number of Visits  45    Date for PT Re-Evaluation  08/31/19    Authorization Type  BCBS    PT Start Time  0800    PT Stop Time  0842    PT Time Calculation (min)  42 min    Equipment Utilized During Treatment  Gait belt   left AFO   Activity Tolerance  Patient tolerated treatment well    Behavior During Therapy  [facility] for tasks assessed/performed       Past Medical History:  Diagnosis Date  . Arthritis   . CHF (congestive heart failure) ([place])   . Combined systolic and diastolic cardiac dysfunction    Echo 11/03/2013 EF 15%, grade 3 diastolic dysfunction  . Hypertension   . NICM (nonischemic cardiomyopathy) ([place])    a. L/RHC (11/05/13): RA: 3, RV 52/5, PA 49/19 (31), PCWP 10, AO 166/93, PA 67%, Fick CO/CI: 5.71/2.97, Lmain: normal, LAD: large, without signficant dz, first diagonal has 20% dz at ostium, LCx: normal, RCA: 30% stenosis at the bifurcation of PDA and PLOM    Past Surgical History:  Procedure Laterality Date  . BIOPSY  01/31/2019   Procedure: BIOPSY;  Surgeon: Otis Brace, MD;  Location: [facility]COPY;  Service: Gastroenterology;;  . ESOPHAGOGASTRODUODENOSCOPY N/A 01/31/2019   Procedure: ESOPHAGOGASTRODUODENOSCOPY (EGD);  Surgeon: Otis Brace, MD;  Location: [facility] ENDOSCOPY;  Service: Gastroenterology;  Laterality: N/A;  . IR FLUORO GUIDE CV LINE RIGHT  07/25/2018  . IR US GUIDE VASC ACCESS RIGHT  07/25/2018  . LEFT AND RIGHT HEART CATHETERIZATION WITH CORONARY ANGIOGRAM N/A 11/05/2013   Procedure: LEFT AND RIGHT HEART CATHETERIZATION WITH CORONARY ANGIOGRAM;  Surgeon: Peter M  Martinique, MD;  Location: [facility] CATH LAB;  Service: Cardiovascular;  Laterality: N/A;  . RIGHT HEART CATHETERIZATION N/A 01/14/2014   Procedure: RIGHT HEART CATH;  Surgeon: Larey Dresser, MD;  Location: [facility] CATH LAB;  Service: Cardiovascular;  Laterality: N/A;    There were no vitals filed for this visit.  Subjective Assessment - 06/19/19 0803    Subjective  Pt reports that he is doing well. Saw neurologist yesterday. Reports that he was cleared to drive in 2 weeks but can start around house. They decreased his depakote. Pt reports that he was having some left hip pain after doing hip exercise with band.    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 8/31-9/3 then inpatient rehab 9/3-9/23.PMH: CHF, HTN, hyperlipidemia, glomerulonephritis, pancytopenia/chronic anemia, seizure left-sided post stroke    Patient Stated Goals  Pt wants to be able to walk again.    Currently in Pain?  No/denies                       [facility] Adult PT Treatment/Exercise - 06/19/19 0806      Ambulation/Gait   Ambulation/Gait  Yes    Ambulation/Gait Assistance  5: Supervision    Ambulation/Gait Assistance Details  Pt was cued to tighten left gluts during stance to try to prevent trendelenberg    Ambulation Distance (Feet)  345 Feet  Assistive device  None    Gait Pattern  Step-through pattern;Decreased step length - right;Decreased step length - left   left trendelenberg   Ambulation Surface  Level;Indoor    Gait Comments  Pt ambulated on treadmill x 5 min at 1.23mh with verbal cuing to increase step length and try to stand more erect. HR=72,  BP=160/90 after. Pt challenged with gait on treadmill and fatigues quicker.  At end of session pt ambulated another 285 without AD with emphasis on better left leg control during stance.      Neuro Re-ed    Neuro Re-ed Details   Gait in // bars without UE support 6' x 6, tapping 2"  foam with right foot x 15 working on left weight shift without leaning, then performed with cone for higher target x 10. Step ups with left foot with red theraband around thighs x 10 to increase glut med activation, gait in // bars with red theraband around thighs 6' x 6, side stepping with red theraband 6' x 2 each way. Mini-squats with red theraband x 10             PT Education - 06/19/19 1329    Education Details  Pt instructed to continue to focus on hip strengthening and activation with gait during left stance    Person(s) Educated  Patient    Methods  Explanation    Comprehension  Verbalized understanding       PT Short Term Goals - 06/02/19 1818      PT SHORT TERM GOAL #1   Title  Pt will ambulate >400' on varied surfaces with [facility]upervision for improved community mobility. (target date for STGs=07/02/19)    Time  4    Period  Weeks    Status  New    Target Date  07/02/19      PT SHORT TERM GOAL #2   Title  Pt will increase gait speed from 0.631m to >0.5m32mwith SPC for improved gait safety.    Baseline  06/02/2019 comfortable =0.754m254mnd fast=0.54m/s37m Time  4    Period  Weeks    Status  On-going    Target Date  07/02/19      PT SHORT TERM GOAL #3   Title  Pt will be able to perform sit to stand without UE support from standard chair consistently for improved functional strength.    Time  4    Period  Weeks    Status  On-going    Target Date  07/02/19        PT Long Term Goals - 06/02/19 1823      PT LONG TERM GOAL #1   Title  Pt will ambulate > 500' on varied surfaces with SPC mod I for improved community mobility.(Target date fo LTGs=08/01/19)    Baseline  supervision on level and supervision/CGA on nonlevel    Time  8    Period  Weeks    Status  On-going    Target Date  08/01/19      PT LONG TERM GOAL #2   Title  Pt will increase Berg from 41/56 to > 48/56 for improved balance and decreased fall risk.    Baseline  45/56 on 06/02/19    Time  8     Period  Weeks    Status  On-going    Target Date  08/01/19      PT LONG TERM GOAL #3   Title  Pt will increase DGI from 16/21 to 19/21 for improved balance and gait safety.    Baseline  16/21 on 06/02/19    Time  8    Period  Weeks    Status  New    Target Date  08/01/19            Plan - 06/19/19 1330    Clinical Impression Statement  Focus today was on improving glut med activation to help more during stance time on left during gait. Pt was able to begin gait training without AD today but did note slight increase in trendelenberg.    Personal Factors and Comorbidities  Comorbidity 3+    Comorbidities  CHF, HTN, hyperlipidemia, glomerulonephritis, pancytopenia/chronic anemia, seizure    Examination-Activity Limitations  Stairs;Stand;Squat;Locomotion Level;Transfers;Bed Mobility    Examination-Participation Restrictions  Community Activity;Shop;Driving;Cleaning;Yard Work    [employer] officer  Evolving/Moderate complexity    Rehab Potential  Good    PT Frequency  2x / week    PT Duration  8 weeks    PT Treatment/Interventions  DME Instruction;ADLs/Self Care Home Management;Gait training;Stair training;Functional mobility training;Therapeutic activities;Therapeutic exercise;Balance training;Orthotic Fit/Training;Patient/family education;Neuromuscular re-education;Wheelchair mobility training;Passive range of motion;Manual techniques    PT Next Visit Plan  Continue gait with SPC adding in dynamic gait activities. Gait on nonlevel surfaces with SPC. Standing balance with more SLS activities and strengthening to left LE with more focus on glut med.    PT Home Exercise Plan  Access Code: [email or web address]    Consulted and Agree with Plan of Care  Patient    Family Member Consulted  wife       Patient will benefit from skilled therapeutic intervention in order to improve the following deficits and impairments:  Abnormal gait, Difficulty walking, Decreased balance, Decreased  mobility, Decreased strength, Decreased knowledge of use of DME, Decreased endurance, Decreased activity tolerance, Decreased range of motion, Pain, Increased muscle spasms  Visit Diagnosis: Other abnormalities of gait and mobility  Muscle weakness (generalized)     Problem List Patient Active Problem List   Diagnosis Date Noted  . Essential hypertension 05/25/2019  . 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 Acute blood loss anemia   . Steroid-induced hyperglycemia   . Acute on chronic anemia   . Seizures ([place])   . Spastic hemiparesis ([place])   . 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 Hypertension   . Thrombocytopenia ([place])   . Acute unilateral cerebral infarction in a watershed distribution [facility]) 01/15/2019  . Weakness   . CVA (cerebral vascular accident) ([place]) 01/12/2019  . Malnutrition of moderate degree 07/31/2018  . Elevated serum protein level   . Multiple myeloma ([place])   . Pancytopenia ([place])   . Normocytic normochromic anemia 07/16/2018  . Acute kidney failure ([place]) 07/16/2018  . NICM (nonischemic cardiomyopathy) ([place]) 11/12/2013  . History of ETOH abuse 11/12/2013  . Chronic combined systolic and diastolic CHF (congestive heart failure) ([place]) 11/12/2013  . Congestive dilated cardiomyopathy ([place]) 11/04/2013  . Malignant hypertension     Electa Sniff, PT, DPT, NCS 06/19/2019, 1:32 PM  [facility] [address] [place], [place], [address] Phone: [phone]   Fax:  [phone]  Name: Jeremy Grant MRN: [id number] Date of Birth: 1961/01/11

## 2019-06-22 ENCOUNTER — Other Ambulatory Visit: Payer: Self-pay

## 2019-06-22 ENCOUNTER — Ambulatory Visit (HOSPITAL_COMMUNITY)
Admission: RE | Admit: 2019-06-22 | Discharge: 2019-06-22 | Disposition: A | Discharge status: Home / Self Care | Payer: BC Managed Care – PPO | Source: Ambulatory Visit | Attending: Nephrology | Admitting: Nephrology

## 2019-06-22 ENCOUNTER — Ambulatory Visit: Payer: BC Managed Care – PPO

## 2019-06-22 VITALS — BP 161/82 | HR 67 | Temp 97.2°F | Resp 18

## 2019-06-22 DIAGNOSIS — M6281 Muscle weakness (generalized): Secondary | ICD-10-CM

## 2019-06-22 DIAGNOSIS — N179 Acute kidney failure, unspecified: Secondary | ICD-10-CM | POA: Diagnosis present

## 2019-06-22 DIAGNOSIS — R2689 Other abnormalities of gait and mobility: Secondary | ICD-10-CM

## 2019-06-22 LAB — POCT HEMOGLOBIN-HEMACUE: Hemoglobin: 11.1 g/dL — ABNORMAL LOW (ref 13.0–17.0)

## 2019-06-22 MED ORDER — EPOETIN ALFA-EPBX 10000 UNIT/ML IJ SOLN
20000.0000 [IU] | INTRAMUSCULAR | Status: DC
  Administered 2019-06-22: 09:00:00 20000 [IU] via SUBCUTANEOUS

## 2019-06-22 MED ORDER — EPOETIN ALFA-EPBX 10000 UNIT/ML IJ SOLN
INTRAMUSCULAR | Status: AC
  Filled 2019-06-22: qty 2

## 2019-06-22 NOTE — Therapy (Signed)
[facility] [address] [place], [place], [address] Phone: [phone]   Fax:  [phone]  Physical Therapy Treatment  Patient Details  Name: Jeremy Grant MRN: [id number] Date of Birth: 04/11/1961 Referring Provider (PT): Dr. Letta Pate   Encounter Date: 06/22/2019  PT End of Session - 06/22/19 0806    Visit Number  34    Number of Visits  45    Date for PT Re-Evaluation  08/31/19    Authorization Type  BCBS    PT Start Time  0755    PT Stop Time  0840    PT Time Calculation (min)  45 min    Equipment Utilized During Treatment  Gait belt   left AFO   Activity Tolerance  Patient tolerated treatment well    Behavior During Therapy  [facility] for tasks assessed/performed       Past Medical History:  Diagnosis Date  . Arthritis   . CHF (congestive heart failure) ([place])   . Combined systolic and diastolic cardiac dysfunction    Echo 11/03/2013 EF 01%, grade 3 diastolic dysfunction  . Hypertension   . NICM (nonischemic cardiomyopathy) ([place])    a. L/RHC (11/05/13): RA: 3, RV 52/5, PA 49/19 (31), PCWP 10, AO 166/93, PA 67%, Fick CO/CI: 5.71/2.97, Lmain: normal, LAD: large, without signficant dz, first diagonal has 20% dz at ostium, LCx: normal, RCA: 30% stenosis at the bifurcation of PDA and PLOM    Past Surgical History:  Procedure Laterality Date  . BIOPSY  01/31/2019   Procedure: BIOPSY;  Surgeon: Otis Brace, MD;  Location: [facility] ENDOSCOPY;  Service: Gastroenterology;;  . ESOPHAGOGASTRODUODENOSCOPY N/A 01/31/2019   Procedure: ESOPHAGOGASTRODUODENOSCOPY (EGD);  Surgeon: Otis Brace, MD;  Location: [facility] ENDOSCOPY;  Service: Gastroenterology;  Laterality: N/A;  . IR FLUORO GUIDE CV LINE RIGHT  07/25/2018  . IR US GUIDE VASC ACCESS RIGHT  07/25/2018  . LEFT AND RIGHT HEART CATHETERIZATION WITH CORONARY ANGIOGRAM N/A 11/05/2013   Procedure: LEFT AND RIGHT HEART CATHETERIZATION WITH CORONARY ANGIOGRAM;  Surgeon: Peter M  Martinique, MD;  Location: [facility] CATH LAB;  Service: Cardiovascular;  Laterality: N/A;  . RIGHT HEART CATHETERIZATION N/A 01/14/2014   Procedure: RIGHT HEART CATH;  Surgeon: Larey Dresser, MD;  Location: [facility] CATH LAB;  Service: Cardiovascular;  Laterality: N/A;    There were no vitals filed for this visit.  Subjective Assessment - 06/22/19 0806    Subjective  Pt reports doing well. Left hip was a little sore yesterday but had been working it out Saturday.    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 8/31-9/3 then inpatient rehab 9/3-9/23.PMH: CHF, HTN, hyperlipidemia, glomerulonephritis, pancytopenia/chronic anemia, seizure left-sided post stroke    Patient Stated Goals  Pt wants to be able to walk again.    Currently in Pain?  No/denies                       [facility] Adult PT Treatment/Exercise - 06/22/19 0807      Ambulation/Gait   Ambulation/Gait  Yes    Ambulation/Gait Assistance  5: Supervision;6: Modified independent (Device/Increase time)    Ambulation/Gait Assistance Details  Pt ambulated first 48' with SPC mod I then the remainder without AD supervision. Verbal and tactile cues to increase left weight shift and tighten gluts during left stance as slight trendelenberg    Ambulation Distance (Feet)  575 Feet    Assistive device  Straight cane;None   left AFO donned   Gait Pattern  Step-through pattern;Decreased stance time - left;Decreased step length - right;Decreased dorsiflexion - left    Ambulation Surface  Level;Indoor    Gait Comments  Pt ambulated 7 min on treadmill at 1.30mh. HR=78 after and patient reported some fatigue. Pt was cued to increase step length and try to relax grip some. Pt had more upright posture on treadmill today.      Neuro Re-ed    Neuro Re-ed Details   Gait weaving in and out of 5 cones without AD x 6 laps CGA with verbal cues to increase step length.  Alternating toe taps on cone x 20 with cues to control SLS time. Along counter for safety: side stepping without UE support 6' x 2 then with red theraband around legs 6' x 4, marching forward and backward with 1 UE support with red theraband around thighs 6' x 6 with cues to increase step length. Gait overground without AD with emphasis on large steps 115' then 115' with stop/go on command, then 115' with head turns on command in varied directions, then turns right/left on command 115'. CGA for safety with activities but patient did not require any physical assistance to recover from LOB. Gait over floor ladder with lateral/diagonal stepping through ladder x 4 CGA.      Exercises   Exercises  Other Exercises    Other Exercises   left standing hip ext 10 x 2 with red theraband with verbal cues for form.             PT Education - 06/22/19 0850    Education Details  Pt to continue with current HEP    Person(s) Educated  Patient    Methods  Explanation    Comprehension  Verbalized understanding       PT Short Term Goals - 06/02/19 1818      PT SHORT TERM GOAL #1   Title  Pt will ambulate >400' on varied surfaces with S[facility]supervision for improved community mobility. (target date for STGs=07/02/19)    Time  4    Period  Weeks    Status  New    Target Date  07/02/19      PT SHORT TERM GOAL #2   Title  Pt will increase gait speed from 0.681m to >0.32m22mwith SPC for improved gait safety.    Baseline  06/02/2019 comfortable =0.52m76mnd fast=0.28m/s528m Time  4    Period  Weeks    Status  On-going    Target Date  07/02/19      PT SHORT TERM GOAL #3   Title  Pt will be able to perform sit to stand without UE support from standard chair consistently for improved functional strength.    Time  4    Period  Weeks    Status  On-going    Target Date  07/02/19        PT Long Term Goals - 06/02/19 1823      PT LONG TERM GOAL #1   Title  Pt will ambulate > 500' on varied surfaces with  SPC mod I for improved community mobility.(Target date fo LTGs=08/01/19)    Baseline  supervision on level and supervision/CGA on nonlevel    Time  8    Period  Weeks    Status  On-going    Target Date  08/01/19      PT LONG TERM GOAL #2  Title  Pt will increase Berg from 41/56 to > 48/56 for improved balance and decreased fall risk.    Baseline  45/56 on 06/02/19    Time  8    Period  Weeks    Status  On-going    Target Date  08/01/19      PT LONG TERM GOAL #3   Title  Pt will increase DGI from 16/21 to 19/21 for improved balance and gait safety.    Baseline  16/21 on 06/02/19    Time  8    Period  Weeks    Status  New    Target Date  08/01/19            Plan - 06/22/19 0850    Clinical Impression Statement  Pt was able to show improved stability with gait without AD today. He also had improved activity tolerance with increasing time on treadmill with more upright posture. PT was able to incorporate more dynamic activities with gait without AD with only CGA.    Personal Factors and Comorbidities  Comorbidity 3+    Comorbidities  CHF, HTN, hyperlipidemia, glomerulonephritis, pancytopenia/chronic anemia, seizure    Examination-Activity Limitations  Stairs;Stand;Squat;Locomotion Level;Transfers;Bed Mobility    Examination-Participation Restrictions  Community Activity;Shop;Driving;Cleaning;Yard Work    [employer]chant [employer] officer  Evolving/Moderate complexity    Rehab Potential  Good    PT Frequency  2x / week    PT Duration  8 weeks    PT Treatment/Interventions  DME Instruction;ADLs/Self Care Home Management;Gait training;Stair training;Functional mobility training;Therapeutic activities;Therapeutic exercise;Balance training;Orthotic Fit/Training;Patient/family education;Neuromuscular re-education;Wheelchair mobility training;Passive range of motion;Manual techniques    PT Next Visit Plan  Gait with and without SPC on varied surfaces. Try BWS over treadmill to allow for  decreased UE support. Standing balance with more SLS activities and strengthening to left LE with more focus on glut med.    PT Home Exercise Plan  Access Code: [email or web address]    Consulted and Agree with Plan of Care  Patient    Family Member Consulted  wife       Patient will benefit from skilled therapeutic intervention in order to improve the following deficits and impairments:  Abnormal gait, Difficulty walking, Decreased balance, Decreased mobility, Decreased strength, Decreased knowledge of use of DME, Decreased endurance, Decreased activity tolerance, Decreased range of motion, Pain, Increased muscle spasms  Visit Diagnosis: Other abnormalities of gait and mobility  Muscle weakness (generalized)     Problem List Patient Active Problem List   Diagnosis Date Noted  . Essential hypertension 05/25/2019  . 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 Acute blood loss anemia   . Steroid-induced hyperglycemia   . Acute on chronic anemia   . Seizures ([place])   . Spastic hemiparesis ([place])   . 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 Hypertension   . Thrombocytopenia ([place])   . Acute unilateral cerebral infarction in a watershed distribution [facility]) 01/15/2019  . Weakness   . CVA (cerebral vascular accident) ([place]) 01/12/2019  . Malnutrition of moderate degree 07/31/2018  . Elevated serum protein level   . Multiple myeloma ([place])   . Pancytopenia ([place])   . Normocytic normochromic anemia 07/16/2018  . Acute kidney failure ([place]) 07/16/2018  . NICM (nonischemic cardiomyopathy) ([place]) 11/12/2013  . History of ETOH abuse 11/12/2013  . Chronic combined systolic and diastolic CHF (congestive heart failure) ([place]) 11/12/2013  . Congestive dilated cardiomyopathy ([place]) 11/04/2013  . Malignant hypertension     Electa Sniff, PT, DPT, NCS 06/22/2019, 8:53 AM  [facility] [address] [place], [place], [address] Phone: [phone]   Fax:  [phone]  Name:  Jeremy Grant MRN: [id number] Date of Birth: Dec 28, 1960

## 2019-06-23 ENCOUNTER — Telehealth: Payer: Self-pay | Admitting: *Deleted

## 2019-06-23 NOTE — Telephone Encounter (Signed)
They would like to find out what the monitor would cost prior to authorizing an enrollment.  Their BCBS [place] plan is a high deductible plan and they cannot afford out of pocket at this time. I will call [employer] to obtain out of pocket quote and leave message for Jeremy Grant to notifiy [place] if she wants [place] to go ahead and enroll for monitor to be shipped.  If monitor out of pocket too high, they may want to wait patients disability starts, (in legal proceedings currently),  Qualifying for Medicare/Medicaid would make monitor more economically feasible.

## 2019-06-23 NOTE — Telephone Encounter (Signed)
[employer] was called to obtain out of pocket cost quote.  Out of pocket cost to patient from [employer] will be $35.00.  Permission was given by Mrs. Code to proceed with enrollment. Patient enrolled for [employer] to ship a 30 day cardiac event monitor to their home.  Instructions reviewed briefly as they are included in the monitor kit.

## 2019-06-25 ENCOUNTER — Ambulatory Visit: Payer: BC Managed Care – PPO

## 2019-06-26 ENCOUNTER — Other Ambulatory Visit: Payer: Self-pay

## 2019-06-26 ENCOUNTER — Ambulatory Visit: Payer: BC Managed Care – PPO

## 2019-06-26 DIAGNOSIS — R2689 Other abnormalities of gait and mobility: Secondary | ICD-10-CM | POA: Diagnosis not present

## 2019-06-26 DIAGNOSIS — M6281 Muscle weakness (generalized): Secondary | ICD-10-CM

## 2019-06-26 NOTE — Therapy (Signed)
[facility] [address] [place], [place], [address] Phone: [phone]   Fax:  [phone]  Physical Therapy Treatment  Patient Details  Name: Jeremy Grant MRN: [id number] Date of Birth: 1961-01-16 Referring Provider (PT): Dr. Letta Pate   Encounter Date: 06/26/2019  PT End of Session - 06/26/19 0808    Visit Number  35    Number of Visits  45    Date for PT Re-Evaluation  08/31/19    Authorization Type  BCBS    PT Start Time  0804    PT Stop Time  0858    PT Time Calculation (min)  54 min    Equipment Utilized During Treatment  Gait belt   left AFO   Activity Tolerance  Patient tolerated treatment well    Behavior During Therapy  [facility] for tasks assessed/performed       Past Medical History:  Diagnosis Date  . Arthritis   . CHF (congestive heart failure) ([place])   . Combined systolic and diastolic cardiac dysfunction    Echo 11/03/2013 EF 78%, grade 3 diastolic dysfunction  . Hypertension   . NICM (nonischemic cardiomyopathy) ([place])    a. L/RHC (11/05/13): RA: 3, RV 52/5, PA 49/19 (31), PCWP 10, AO 166/93, PA 67%, Fick CO/CI: 5.71/2.97, Lmain: normal, LAD: large, without signficant dz, first diagonal has 20% dz at ostium, LCx: normal, RCA: 30% stenosis at the bifurcation of PDA and PLOM    Past Surgical History:  Procedure Laterality Date  . BIOPSY  01/31/2019   Procedure: BIOPSY;  Surgeon: Otis Brace, MD;  Location: [facility] ENDOSCOPY;  Service: Gastroenterology;;  . ESOPHAGOGASTRODUODENOSCOPY N/A 01/31/2019   Procedure: ESOPHAGOGASTRODUODENOSCOPY (EGD);  Surgeon: Otis Brace, MD;  Location: [facility] ENDOSCOPY;  Service: Gastroenterology;  Laterality: N/A;  . IR FLUORO GUIDE CV LINE RIGHT  07/25/2018  . IR US GUIDE VASC ACCESS RIGHT  07/25/2018  . LEFT AND RIGHT HEART CATHETERIZATION WITH CORONARY ANGIOGRAM N/A 11/05/2013   Procedure: LEFT AND RIGHT HEART CATHETERIZATION WITH CORONARY ANGIOGRAM;  Surgeon: Peter M  Martinique, MD;  Location: [facility] CATH LAB;  Service: Cardiovascular;  Laterality: N/A;  . RIGHT HEART CATHETERIZATION N/A 01/14/2014   Procedure: RIGHT HEART CATH;  Surgeon: Larey Dresser, MD;  Location: [facility] CATH LAB;  Service: Cardiovascular;  Laterality: N/A;    There were no vitals filed for this visit.  Subjective Assessment - 06/26/19 0807    Subjective  Pt reports that he is doing well. He did get injection the other day but hemoglobin was 11.1. Had some deep dental cleaning yesterday.    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 8/31-9/3 then inpatient rehab 9/3-9/23.PMH: CHF, HTN, hyperlipidemia, glomerulonephritis, pancytopenia/chronic anemia, seizure left-sided post stroke    Patient Stated Goals  Pt wants to be able to walk again.    Currently in Pain?  No/denies                       [facility] Adult PT Treatment/Exercise - 06/26/19 0808      Ambulation/Gait   Ambulation/Gait  Yes    Ambulation/Gait Assistance  5: Supervision;4: Min guard    Ambulation/Gait Assistance Details  Pt was cued to increase step length    Ambulation Distance (Feet)  345 Feet    Assistive device  None   left AFO   Gait Pattern  Step-through pattern    Ambulation Surface  Level;Indoor  Gait Comments  Pt ambulated on treadmill x 6 min 20 sec then after standing rest break 4 min 30 sec more in BWS with only about 8lbs unweighted more for safety. Pt ambulated with bilateral UE support initially then down to only right UE support then brief bouts of no UE support with PT providing assist at trunk for stability and pelvic rotation. Pt was cued to increase step length especially on right as when went without UE support pt's steps shortened with decreased left stance time. Pt did improve some during 2nd bout. BP after treadmill=150/88, HR=78.      Exercises   Exercises  Other Exercises    Other Exercises   With AFO  removed: left ankle DF/PF pushing in to therapists hand x 10 with some initiation of PF. At counter left gastroc stretch 30 sec x 2 with verbal cuing to keep foot straight and heel down and push hips forward. Pt then performed bilateral PF x 10 with heel raises. Attempted to try to raise just on left but hardly any lift.             PT Education - 06/26/19 0915    Education Details  Added left ankle exercises to HEP that are bolded.    Person(s) Educated  Patient    Methods  Explanation;Demonstration;Handout    Comprehension  Verbalized understanding;Returned demonstration       PT Short Term Goals - 06/02/19 1818      PT SHORT TERM GOAL #1   Title  Pt will ambulate >400' on varied surfaces with SPC supervision for improved community mobility. (target date for STGs=07/02/19)    Time  4    Period  Weeks    Status  New    Target Date  07/02/19      PT SHORT TERM GOAL #2   Title  Pt will increase gait speed from 0.56ms to >0.843m with SPC for improved gait safety.    Baseline  06/02/2019 comfortable =0.6677mand fast=0.7m/71m  Time  4    Period  Weeks    Status  On-going    Target Date  07/02/19      PT SHORT TERM GOAL #3   Title  Pt will be able to perform sit to stand without UE support from standard chair consistently for improved functional strength.    Time  4    Period  Weeks    Status  On-going    Target Date  07/02/19        PT Long Term Goals - 06/02/19 1823      PT LONG TERM GOAL #1   Title  Pt will ambulate > 500' on varied surfaces with SPC mod I for improved community mobility.(Target date fo LTGs=08/01/19)    Baseline  supervision on level and supervision/CGA on nonlevel    Time  8    Period  Weeks    Status  On-going    Target Date  08/01/19      PT LONG TERM GOAL #2   Title  Pt will increase Berg from 41/56 to > 48/56 for improved balance and decreased fall risk.    Baseline  45/56 on 06/02/19    Time  8    Period  Weeks    Status  On-going     Target Date  08/01/19      PT LONG TERM GOAL #3   Title  Pt will increase DGI from 16/21 to 19/21 for improved  balance and gait safety.    Baseline  16/21 on 06/02/19    Time  8    Period  Weeks    Status  New    Target Date  08/01/19            Plan - 06/26/19 0915    Clinical Impression Statement  PT focused on gait without AD today starting in BWS over treadmill. Pt was challenged with faster speed on treadmill with shortening steps and less left stance time but improved some as went on. Needed stability at trunk. Overground walking afterground with improved step length bilateral. PT also noted continued improvement left ankle motion and progressed HEP for ankle.    Personal Factors and Comorbidities  Comorbidity 3+    Comorbidities  CHF, HTN, hyperlipidemia, glomerulonephritis, pancytopenia/chronic anemia, seizure    Examination-Activity Limitations  Stairs;Stand;Squat;Locomotion Level;Transfers;Bed Mobility    Examination-Participation Restrictions  Community Activity;Shop;Driving;Cleaning;Yard Work    [employer] officer  Evolving/Moderate complexity    Rehab Potential  Good    PT Frequency  2x / week    PT Duration  8 weeks    PT Treatment/Interventions  DME Instruction;ADLs/Self Care Home Management;Gait training;Stair training;Functional mobility training;Therapeutic activities;Therapeutic exercise;Balance training;Orthotic Fit/Training;Patient/family education;Neuromuscular re-education;Wheelchair mobility training;Passive range of motion;Manual techniques    PT Next Visit Plan  Continue BWS over treadmill to allow for decreased UE support. Gait overground without AD and trying some without AFO. Continued left ankle strengthening/stretching. Standing balance with more SLS activities and strengthening to left LE with more focus on glut med.    PT Home Exercise Plan  Access Code: [email or web address]    Consulted and Agree with Plan of Care  Patient    Family Member Consulted   wife       Patient will benefit from skilled therapeutic intervention in order to improve the following deficits and impairments:  Abnormal gait, Difficulty walking, Decreased balance, Decreased mobility, Decreased strength, Decreased knowledge of use of DME, Decreased endurance, Decreased activity tolerance, Decreased range of motion, Pain, Increased muscle spasms  Visit Diagnosis: Other abnormalities of gait and mobility  Muscle weakness (generalized)     Problem List Patient Active Problem List   Diagnosis Date Noted  . Essential hypertension 05/25/2019  . 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 Acute blood loss anemia   . Steroid-induced hyperglycemia   . Acute on chronic anemia   . Seizures ([place])   . Spastic hemiparesis ([place])   . 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 Hypertension   . Thrombocytopenia ([place])   . Acute unilateral cerebral infarction in a watershed distribution [facility]) 01/15/2019  . Weakness   . CVA (cerebral vascular accident) ([place]) 01/12/2019  . Malnutrition of moderate degree 07/31/2018  . Elevated serum protein level   . Multiple myeloma ([place])   . Pancytopenia ([place])   . Normocytic normochromic anemia 07/16/2018  . Acute kidney failure ([place]) 07/16/2018  . NICM (nonischemic cardiomyopathy) ([place]) 11/12/2013  . History of ETOH abuse 11/12/2013  . Chronic combined systolic and diastolic CHF (congestive heart failure) ([place]) 11/12/2013  . Congestive dilated cardiomyopathy ([place]) 11/04/2013  . Malignant hypertension     Electa Sniff, PT, DPT, NCS 06/26/2019, 9:19 AM  [facility] [address] [place], [place], [address] Phone: [phone]   Fax:  [phone]  Name: Jeremy Grant MRN: [id number] Date of Birth: 1960-12-28

## 2019-06-26 NOTE — Patient Instructions (Signed)
Access Code: [email or web address] URL: [email or web address] Date: 06/26/2019 Prepared by: Cherly Anderson  Exercises Seated Hamstring Stretch with Strap - 4 reps - 1 sets - 1 min hold - 3x daily - 7x weekly Supine Short Arc Quad - 10 reps - 1 sets - 2x daily - 7x weekly Supine Bridge with Pelvic Floor Contraction on Swiss Ball - 10 reps - 1 sets - 2x daily - 7x weekly Supine Ankle Dorsiflexion Stretch with Caregiver - 4 reps - 1 sets - 1 min hold - 3x daily - 7x weekly Clamshell - 5 reps - 2 sets - 1x daily - 7x weekly Seated Knee Flexion - 5 reps - 3 sets - 1x daily - 7x weekly Prone Knee Flexion - 10 reps - 2 sets - 1x daily - 5x weekly Standing with Head Rotation - 3 reps - 1 sets - 10 turns hold - 2x daily - 7x weekly Standing Balance with Eyes Closed - 2 reps - 1 sets - 30 sec hold - 2x daily - 7x weekly Scapular Retraction with Resistance - 10 reps - 3 sets - 1x daily - 7x weekly Standing Gastroc Stretch at Counter - 4 reps - 1 sets - 30 sec hold - 2x daily - 7x weekly Heel rises with counter support - 10 reps - 2 sets - 2x daily - 7x weekly

## 2019-06-29 ENCOUNTER — Other Ambulatory Visit: Payer: Self-pay

## 2019-06-29 ENCOUNTER — Ambulatory Visit: Payer: BC Managed Care – PPO

## 2019-06-29 DIAGNOSIS — R2689 Other abnormalities of gait and mobility: Secondary | ICD-10-CM

## 2019-06-29 DIAGNOSIS — M6281 Muscle weakness (generalized): Secondary | ICD-10-CM

## 2019-06-29 NOTE — Therapy (Signed)
[facility] [address] [place], [place], [address] Phone: [phone]   Fax:  [phone]  Physical Therapy Treatment  Patient Details  Name: Jeremy Grant MRN: [id number] Date of Birth: Jan 26, 1961 Referring Provider (PT): Dr. Letta Pate   Encounter Date: 06/29/2019  PT End of Session - 06/29/19 0803    Visit Number  36    Number of Visits  45    Date for PT Re-Evaluation  08/31/19    Authorization Type  BCBS    PT Start Time  0800    PT Stop Time  0844    PT Time Calculation (min)  44 min    Equipment Utilized During Treatment  Gait belt   left AFO   Activity Tolerance  Patient tolerated treatment well    Behavior During Therapy  [facility] for tasks assessed/performed       Past Medical History:  Diagnosis Date  . Arthritis   . CHF (congestive heart failure) ([place])   . Combined systolic and diastolic cardiac dysfunction    Echo 11/03/2013 EF 17%, grade 3 diastolic dysfunction  . Hypertension   . NICM (nonischemic cardiomyopathy) ([place])    a. L/RHC (11/05/13): RA: 3, RV 52/5, PA 49/19 (31), PCWP 10, AO 166/93, PA 67%, Fick CO/CI: 5.71/2.97, Lmain: normal, LAD: large, without signficant dz, first diagonal has 20% dz at ostium, LCx: normal, RCA: 30% stenosis at the bifurcation of PDA and PLOM    Past Surgical History:  Procedure Laterality Date  . BIOPSY  01/31/2019   Procedure: BIOPSY;  Surgeon: Otis Brace, MD;  Location: [place] [facility] ENDOSCOPY;  Service: Gastroenterology;;  . ESOPHAGOGASTRODUODENOSCOPY N/A 01/31/2019   Procedure: ESOPHAGOGASTRODUODENOSCOPY (EGD);  Surgeon: Otis Brace, MD;  Location: [facility] ENDOSCOPY;  Service: Gastroenterology;  Laterality: N/A;  . IR FLUORO GUIDE CV LINE RIGHT  07/25/2018  . IR US GUIDE VASC ACCESS RIGHT  07/25/2018  . LEFT AND RIGHT HEART CATHETERIZATION WITH CORONARY ANGIOGRAM N/A 11/05/2013   Procedure: LEFT AND RIGHT HEART CATHETERIZATION WITH CORONARY ANGIOGRAM;  Surgeon: Peter M  Martinique, MD;  Location: [facility] CATH LAB;  Service: Cardiovascular;  Laterality: N/A;  . RIGHT HEART CATHETERIZATION N/A 01/14/2014   Procedure: RIGHT HEART CATH;  Surgeon: Larey Dresser, MD;  Location: [facility] CATH LAB;  Service: Cardiovascular;  Laterality: N/A;    There were no vitals filed for this visit.  Subjective Assessment - 06/29/19 0803    Subjective  Pt reports that he was sore after Friday session. Yesterday left hip was bothering him some and left foot had some spasms.    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 8/31-9/3 then inpatient rehab 9/3-9/23.PMH: CHF, HTN, hyperlipidemia, glomerulonephritis, pancytopenia/chronic anemia, seizure left-sided post stroke    Patient Stated Goals  Pt wants to be able to walk again.    Currently in Pain?  Yes    Pain Location  Hip    Pain Orientation  Left    Pain Descriptors / Indicators  Sore    Pain Type  Acute pain                       [facility] Adult PT Treatment/Exercise - 06/29/19 0805      Transfers   Transfers  Sit to Stand;Stand to Sit    Sit to Stand  6: Modified independent (Device/Increase time)    Comments  Pt performed sit to stand x 5 from standard  chair without UE support but did need a couple attempts at times and relied on hand in lap and momentum to rise.      Ambulation/Gait   Ambulation/Gait  Yes    Ambulation/Gait Assistance  6: Modified independent (Device/Increase time);5: Supervision    Ambulation/Gait Assistance Details  Pt ambulated inside on level surface, over blue mat supervision on mat. Mod I on level surface    Ambulation Distance (Feet)  600 Feet    Assistive device  Straight cane    Gait velocity  10.68 sec comfortable=0.67ms and fast=9.34 sec=1.05m with SPC. Without AD=11.52 sec=0.8695m   Ramp  6: Modified independent (Device)    Ramp Details (indicate cue type and reason)  with SPC x 2    Curb  5:  Supervision    Curb Details (indicate cue type and reason)  with SPC x 2    Gait Comments  Pt ambulated without AD 345' CGA. Noted decreased left stance without AD and slight increase in right pelvic drop during left stance. [facility] with [facility] A[facility] 230' with CGA. Verbal cues to try to increase heel strike on left. Pt had one episode of tripping with right foot.       Neuro Re-ed    Neuro Re-ed Details   In // bars: gait with visual cues in mirror 6' x 6 working on maintaining pelvis left on left during stance. Step-ups on 4" step with left leg x 10. At counter: raising up on toes x 10, back on heels x 10. Standing on rockerboard positioned ant/ post maintaining level x 30 sec without UE support, rocking ant/ post x 10, maintaining level with manual pertubations x 30 sec. PT did help to position pelvis in neutral and rotated posterio on left at first. No AFO with ankle activities.              PT Education - 06/29/19 1008    Education Details  Pt to continue with current HEP    Person(s) Educated  Patient    Methods  Explanation    Comprehension  Verbalized understanding       PT Short Term Goals - 06/29/19 0805      PT SHORT TERM GOAL #1   Title  Pt will ambulate >400' on varied surfaces with [facility]PCAd[facility]villepervision for improved community mobility. (target date for STGs=07/02/19)    Baseline  Pt has met this goal with simulated indoor surfaces due to bad weather    Time  4    Period  Weeks    Status  Achieved    Target Date  07/02/19      PT SHORT TERM GOAL #2   Title  Pt will increase gait speed from 0.222m1069mo >0.22m/s10mth SPC for improved gait safety.    Baseline  06/02/2019 comfortable =0.222m/s55m fast=0.69m/s, 105m10m/s c45mrtable and 1.66m/s fa82mn 06/29/19    Time  4    Period  Weeks    Status  Achieved    Target Date  07/02/19      PT SHORT TERM GOAL #3   Title  Pt will be able to perform sit to stand without UE support from standard chair consistently for improved  functional strength.    Baseline  not consistent without UE support from chair. Able to perform inconsistently with hands in lap relying on momentum    Time  4    Period  Weeks    Status  On-going  Target Date  07/02/19        PT Long Term Goals - 06/29/19 1207      PT LONG TERM GOAL #1   Title  Pt will ambulate > 500' on varied surfaces with SPC versus no AD mod I for improved community mobility.(Target date fo LTGs=08/01/19)    Baseline  supervision on level and supervision/CGA on nonlevel    Time  8    Period  Weeks    Status  Revised      PT LONG TERM GOAL #2   Title  Pt will increase Berg from 41/56 to > 48/56 for improved balance and decreased fall risk.    Baseline  45/56 on 06/02/19    Time  8    Period  Weeks    Status  On-going      PT LONG TERM GOAL #3   Title  Pt will increase DGI from 16/21 to 19/21 for improved balance and gait safety.    Baseline  16/21 on 06/02/19    Time  8    Period  Weeks    Status  New            Plan - 06/29/19 1208    Clinical Impression Statement  STGs were reassessed at visit today. Pt met gait goal with SPC at supervision level on varied surfaces in clinic. Pt continues to show improvement in gait speed ambulating at a safer speed for community ambulator at this time. PT has started working on gait without AD and also with gait with removing AFO at times. Pt continues to benefit from skilled PT to further progress gait, balance and strength.    Personal Factors and Comorbidities  Comorbidity 3+    Comorbidities  CHF, HTN, hyperlipidemia, glomerulonephritis, pancytopenia/chronic anemia, seizure    Examination-Activity Limitations  Stairs;Stand;Squat;Locomotion Level;Transfers;Bed Mobility    Examination-Participation Restrictions  Community Activity;Shop;Driving;Cleaning;Yard Work    [employer]chant [employer] officer  Evolving/Moderate complexity    Rehab Potential  Good    PT Frequency  2x / week    PT Duration  8 weeks    PT  Treatment/Interventions  DME Instruction;ADLs/Self Care Home Management;Gait training;Stair training;Functional mobility training;Therapeutic activities;Therapeutic exercise;Balance training;Orthotic Fit/Training;Patient/family education;Neuromuscular re-education;Wheelchair mobility training;Passive range of motion;Manual techniques    PT Next Visit Plan  Continue BWS over treadmill to allow for decreased UE support. Gait overground without AD and trying some without AFO. Continued left ankle strengthening/stretching. Standing balance with more SLS activities and strengthening to left LE with more focus on glut med.    PT Home Exercise Plan  Access Code: [email or web address]    Consulted and Agree with Plan of Care  Patient    Family Member Consulted  wife       Patient will benefit from skilled therapeutic intervention in order to improve the following deficits and impairments:  Abnormal gait, Difficulty walking, Decreased balance, Decreased mobility, Decreased strength, Decreased knowledge of use of DME, Decreased endurance, Decreased activity tolerance, Decreased range of motion, Pain, Increased muscle spasms  Visit Diagnosis: Other abnormalities of gait and mobility  Muscle weakness (generalized)     Problem List Patient Active Problem List   Diagnosis Date Noted  . Essential hypertension 05/25/2019  . 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 Acute blood loss anemia   . Steroid-induced hyperglycemia   . Acute on chronic anemia   . Seizures ([place])   . Spastic hemiparesis ([place])   . 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 Hypertension   . Thrombocytopenia ([place])   .  Acute unilateral cerebral infarction in a watershed distribution [facility]) 01/15/2019  . Weakness   . CVA (cerebral vascular accident) ([place]) 01/12/2019  . Malnutrition of moderate degree 07/31/2018  . Elevated serum protein level   . Multiple myeloma ([place])   . Pancytopenia ([place])   . Normocytic normochromic anemia 07/16/2018  . Acute kidney failure ([place])  07/16/2018  . NICM (nonischemic cardiomyopathy) ([place]) 11/12/2013  . History of ETOH abuse 11/12/2013  . Chronic combined systolic and diastolic CHF (congestive heart failure) ([place]) 11/12/2013  . Congestive dilated cardiomyopathy ([place]) 11/04/2013  . Malignant hypertension     Electa Sniff, PT, DPT, NCS 06/29/2019, 12:11 PM  [facility] [address] [place], [place], [address] Phone: [phone]   Fax:  [phone]  Name: Jeremy Grant MRN: [id number] Date of Birth: 1960/08/13

## 2019-07-02 ENCOUNTER — Ambulatory Visit: Payer: BC Managed Care – PPO

## 2019-07-02 ENCOUNTER — Other Ambulatory Visit: Payer: BC Managed Care – PPO

## 2019-07-03 ENCOUNTER — Ambulatory Visit: Payer: BC Managed Care – PPO

## 2019-07-03 ENCOUNTER — Other Ambulatory Visit: Payer: Self-pay

## 2019-07-03 DIAGNOSIS — M6281 Muscle weakness (generalized): Secondary | ICD-10-CM

## 2019-07-03 DIAGNOSIS — R2689 Other abnormalities of gait and mobility: Secondary | ICD-10-CM | POA: Diagnosis not present

## 2019-07-03 NOTE — Therapy (Signed)
[facility] [address] [place], [place], [address] Phone: [phone]   Fax:  [phone]  Physical Therapy Treatment  Patient Details  Name: Jeremy Grant MRN: [id number] Date of Birth: 15-Apr-1961 Referring Provider (PT): Dr. Letta Pate   Encounter Date: 07/03/2019  PT End of Session - 07/03/19 0803    Visit Number  37    Number of Visits  45    Date for PT Re-Evaluation  08/31/19    Authorization Type  BCBS    PT Start Time  0800    PT Stop Time  0845    PT Time Calculation (min)  45 min    Equipment Utilized During Treatment  Gait belt   left AFO   Activity Tolerance  Patient tolerated treatment well    Behavior During Therapy  [facility] for tasks assessed/performed       Past Medical History:  Diagnosis Date  . Arthritis   . CHF (congestive heart failure) ([place])   . Combined systolic and diastolic cardiac dysfunction    Echo 11/03/2013 EF 96%, grade 3 diastolic dysfunction  . Hypertension   . NICM (nonischemic cardiomyopathy) ([place])    a. L/RHC (11/05/13): RA: 3, RV 52/5, PA 49/19 (31), PCWP 10, AO 166/93, PA 67%, Fick CO/CI: 5.71/2.97, Lmain: normal, LAD: large, without signficant dz, first diagonal has 20% dz at ostium, LCx: normal, RCA: 30% stenosis at the bifurcation of PDA and PLOM    Past Surgical History:  Procedure Laterality Date  . BIOPSY  01/31/2019   Procedure: BIOPSY;  Surgeon: Otis Brace, MD;  Location: [facility] ENDOSCOPY;  Service: Gastroenterology;;  . ESOPHAGOGASTRODUODENOSCOPY N/A 01/31/2019   Procedure: ESOPHAGOGASTRODUODENOSCOPY (EGD);  Surgeon: Otis Brace, MD;  Location: [facility] ENDOSCOPY;  Service: Gastroenterology;  Laterality: N/A;  . IR FLUORO GUIDE CV LINE RIGHT  07/25/2018  . IR US GUIDE VASC ACCESS RIGHT  07/25/2018  . LEFT AND RIGHT HEART CATHETERIZATION WITH CORONARY ANGIOGRAM N/A 11/05/2013   Procedure: LEFT AND RIGHT HEART CATHETERIZATION WITH CORONARY ANGIOGRAM;  Surgeon: Peter M  Martinique, MD;  Location: [facility] CATH LAB;  Service: Cardiovascular;  Laterality: N/A;  . RIGHT HEART CATHETERIZATION N/A 01/14/2014   Procedure: RIGHT HEART CATH;  Surgeon: Larey Dresser, MD;  Location: [facility] CATH LAB;  Service: Cardiovascular;  Laterality: N/A;    There were no vitals filed for this visit.  Subjective Assessment - 07/03/19 0802    Subjective  Pt reports he is doing well. Wife reports that he was outside when she wasn't home the other day that he should not have been.    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 8/31-9/3 then inpatient rehab 9/3-9/23.PMH: CHF, HTN, hyperlipidemia, glomerulonephritis, pancytopenia/chronic anemia, seizure left-sided post stroke    Patient Stated Goals  Pt wants to be able to walk again.    Currently in Pain?  No/denies                       [facility] Adult PT Treatment/Exercise - 07/03/19 0803      Transfers   Transfers  Sit to Stand;Stand to Sit    Sit to Stand  6: Modified independent (Device/Increase time)    Stand to Sit  6: Modified independent (Device/Increase time)      Ambulation/Gait   Ambulation/Gait  Yes    Ambulation/Gait Assistance  6: Modified independent (Device/Increase time)    Ambulation/Gait Assistance Details  Pt  was cued to increase step lengths and heel strike.     Ambulation Distance (Feet)  460 Feet    Assistive device  Straight cane   left AFO   Gait Pattern  Step-through pattern;Decreased stance time - left    Ambulation Surface  Level;Indoor    Gait Comments  BWS over treadmill 5 min x 2 with about 10# unweighted more for safety. Pt warmed up each bout with 1 min bilateral UE support, then 1 min 1 UE support, then no UE support with PT facilitating weight shift at pelvis. Pt was given verbal cues to increase step length especially on left and control right heel strike. Also instructed to relax arms to get more arm swing to  help with trunk rotation. BP=158/86 after first 5 min. Pt took standing rest break between bouts. Left AFO donned during gait.      Neuro Re-ed    Neuro Re-ed Details   Gait over obstacle course without AD CGA: reciprocal steps over 4 stepping stones, reciprocal steps over floor ladder then walking over blue mat x 2 laps, then switches to having patient stepping to side and diagonal down floor ladder for more cutting type activies changing direction x 2 laps. Left AFO was donned for all activities.      Exercises   Exercises  Other Exercises    Other Exercises   Standing at counter without left AFO donned: raising up on toes 10 x 2 with verbal and tactile cues to try to equalize weight, standing on rockerboard ant/posterior without UE support shifting back and forth x 15 then maintaining level with manual pertubations x 1 min.             PT Education - 07/03/19 0910    Education Details  Pt to continue with current HEP    Person(s) Educated  Patient    Methods  Explanation    Comprehension  Verbalized understanding       PT Short Term Goals - 06/29/19 0805      PT SHORT TERM GOAL #1   Title  Pt will ambulate >400' on varied surfaces with [facility] supervision for improved community mobility. (target date for STGs=07/02/19)    Baseline  Pt has met this goal with simulated indoor surfaces due to bad weather    Time  4    Period  Weeks    Status  Achieved    Target Date  07/02/19      PT SHORT TERM GOAL #2   Title  Pt will increase gait speed from 0.13ms to >0.8535m with SPC for improved gait safety.    Baseline  06/02/2019 comfortable =0.6668mand fast=0.35m/64m0.34m/56mmfortable and 1.63m/s30mt on 06/29/19    Time  4    Period  Weeks    Status  Achieved    Target Date  07/02/19      PT SHORT TERM GOAL #3   Title  Pt will be able to perform sit to stand without UE support from standard chair consistently for improved functional strength.    Baseline  not consistent without UE  support from chair. Able to perform inconsistently with hands in lap relying on momentum    Time  4    Period  Weeks    Status  On-going    Target Date  07/02/19        PT Long Term Goals - 06/29/19 1207      PT LONG TERM GOAL #1  Title  Pt will ambulate > 500' on varied surfaces with SPC versus no AD mod I for improved community mobility.(Target date fo LTGs=08/01/19)    Baseline  supervision on level and supervision/CGA on nonlevel    Time  8    Period  Weeks    Status  Revised      PT LONG TERM GOAL #2   Title  Pt will increase Berg from 41/56 to > 48/56 for improved balance and decreased fall risk.    Baseline  45/56 on 06/02/19    Time  8    Period  Weeks    Status  On-going      PT LONG TERM GOAL #3   Title  Pt will increase DGI from 16/21 to 19/21 for improved balance and gait safety.    Baseline  16/21 on 06/02/19    Time  8    Period  Weeks    Status  New            Plan - 07/03/19 0911    Clinical Impression Statement  Pt showed improved steps over treadmill in BWS today without UE support compared to prior trial with increasing step length. Continues to show improving stability on left LE with gait.    Personal Factors and Comorbidities  Comorbidity 3+    Comorbidities  CHF, HTN, hyperlipidemia, glomerulonephritis, pancytopenia/chronic anemia, seizure    Examination-Activity Limitations  Stairs;Stand;Squat;Locomotion Level;Transfers;Bed Mobility    Examination-Participation Restrictions  Community Activity;Shop;Driving;Cleaning;Yard Work    [employer] officer  Evolving/Moderate complexity    Rehab Potential  Good    PT Frequency  2x / week    PT Duration  8 weeks    PT Treatment/Interventions  DME Instruction;ADLs/Self Care Home Management;Gait training;Stair training;Functional mobility training;Therapeutic activities;Therapeutic exercise;Balance training;Orthotic Fit/Training;Patient/family education;Neuromuscular re-education;Wheelchair  mobility training;Passive range of motion;Manual techniques    PT Next Visit Plan  Continue BWS over treadmill to allow for decreased UE support. Gait overground without AD and trying some without AFO. Continued left ankle strengthening/stretching. Standing balance with more SLS activities and strengthening to left LE with more focus on glut med.    PT Home Exercise Plan  Access Code: [email or web address]    Consulted and Agree with Plan of Care  Patient    Family Member Consulted  wife       Patient will benefit from skilled therapeutic intervention in order to improve the following deficits and impairments:  Abnormal gait, Difficulty walking, Decreased balance, Decreased mobility, Decreased strength, Decreased knowledge of use of DME, Decreased endurance, Decreased activity tolerance, Decreased range of motion, Pain, Increased muscle spasms  Visit Diagnosis: Other abnormalities of gait and mobility  Muscle weakness (generalized)     Problem List Patient Active Problem List   Diagnosis Date Noted  . Essential hypertension 05/25/2019  . 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 Acute blood loss anemia   . Steroid-induced hyperglycemia   . Acute on chronic anemia   . Seizures ([place])   . Spastic hemiparesis ([place])   . 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 Hypertension   . Thrombocytopenia ([place])   . Acute unilateral cerebral infarction in a watershed distribution [facility]) 01/15/2019  . Weakness   . CVA (cerebral vascular accident) ([place]) 01/12/2019  . Malnutrition of moderate degree 07/31/2018  . Elevated serum protein level   . Multiple myeloma ([place])   . Pancytopenia ([place])   . Normocytic normochromic anemia 07/16/2018  . Acute kidney failure ([place]) 07/16/2018  . NICM (nonischemic cardiomyopathy) ([place]) 11/12/2013  .  History of ETOH abuse 11/12/2013  . Chronic combined systolic and diastolic CHF (congestive heart failure) ([place]) 11/12/2013  . Congestive dilated cardiomyopathy ([place]) 11/04/2013  . Malignant hypertension      Electa Sniff, PT, DPT, NCS 07/03/2019, 9:13 AM  [facility] [address] [place], [place], [address] Phone: [phone]   Fax:  [phone]  Name: SEARS ORAN MRN: [id number] Date of Birth: [birth year]

## 2019-07-06 ENCOUNTER — Ambulatory Visit: Payer: BC Managed Care – PPO

## 2019-07-06 ENCOUNTER — Ambulatory Visit (HOSPITAL_COMMUNITY)
Admission: RE | Admit: 2019-07-06 | Discharge: 2019-07-06 | Disposition: A | Discharge status: Home / Self Care | Payer: BC Managed Care – PPO | Source: Ambulatory Visit | Attending: Nephrology | Admitting: Nephrology

## 2019-07-06 ENCOUNTER — Other Ambulatory Visit: Payer: Self-pay

## 2019-07-06 VITALS — BP 145/83 | HR 70 | Temp 97.4°F | Resp 18

## 2019-07-06 DIAGNOSIS — N179 Acute kidney failure, unspecified: Secondary | ICD-10-CM

## 2019-07-06 DIAGNOSIS — R2689 Other abnormalities of gait and mobility: Secondary | ICD-10-CM | POA: Diagnosis not present

## 2019-07-06 DIAGNOSIS — M6281 Muscle weakness (generalized): Secondary | ICD-10-CM

## 2019-07-06 LAB — POCT HEMOGLOBIN-HEMACUE: Hemoglobin: 11.5 g/dL — ABNORMAL LOW (ref 13.0–17.0)

## 2019-07-06 LAB — IRON AND TIBC
Iron: 85 ug/dL (ref 45–182)
Saturation Ratios: 33 % (ref 17.9–39.5)
TIBC: 258 ug/dL (ref 250–450)
UIBC: 173 ug/dL

## 2019-07-06 LAB — FERRITIN: Ferritin: 628 ng/mL — ABNORMAL HIGH (ref 24–336)

## 2019-07-06 MED ORDER — EPOETIN ALFA-EPBX 10000 UNIT/ML IJ SOLN
INTRAMUSCULAR | Status: AC
  Filled 2019-07-06: qty 2

## 2019-07-06 MED ORDER — EPOETIN ALFA-EPBX 10000 UNIT/ML IJ SOLN
20000.0000 [IU] | INTRAMUSCULAR | Status: DC
  Administered 2019-07-06: 09:00:00 20000 [IU] via SUBCUTANEOUS

## 2019-07-06 NOTE — Therapy (Signed)
[facility] [address] [place], [place], [address] Phone: [phone]   Fax:  [phone]  Physical Therapy Treatment  Patient Details  Name: Jeremy Grant MRN: [id number] Date of Birth: 1961-03-10 Referring Provider (PT): Dr. Letta Pate   Encounter Date: 07/06/2019  PT End of Session - 07/06/19 0718    Visit Number  38    Number of Visits  45    Date for PT Re-Evaluation  08/31/19    Authorization Type  BCBS    PT Start Time  0715    PT Stop Time  0758    PT Time Calculation (min)  43 min    Equipment Utilized During Treatment  Gait belt   left AFO   Activity Tolerance  Patient tolerated treatment well    Behavior During Therapy  [facility] for tasks assessed/performed       Past Medical History:  Diagnosis Date  . Arthritis   . CHF (congestive heart failure) ([place])   . Combined systolic and diastolic cardiac dysfunction    Echo 11/03/2013 EF 01%, grade 3 diastolic dysfunction  . Hypertension   . NICM (nonischemic cardiomyopathy) ([place])    a. L/RHC (11/05/13): RA: 3, RV 52/5, PA 49/19 (31), PCWP 10, AO 166/93, PA 67%, Fick CO/CI: 5.71/2.97, Lmain: normal, LAD: large, without signficant dz, first diagonal has 20% dz at ostium, LCx: normal, RCA: 30% stenosis at the bifurcation of PDA and PLOM    Past Surgical History:  Procedure Laterality Date  . BIOPSY  01/31/2019   Procedure: BIOPSY;  Surgeon: Otis Brace, MD;  Location: [facility] ENDOSCOPY;  Service: Gastroenterology;;  . ESOPHAGOGASTRODUODENOSCOPY N/A 01/31/2019   Procedure: ESOPHAGOGASTRODUODENOSCOPY (EGD);  Surgeon: Otis Brace, MD;  Location: [facility] ENDOSCOPY;  Service: Gastroenterology;  Laterality: N/A;  . IR FLUORO GUIDE CV LINE RIGHT  07/25/2018  . IR US GUIDE VASC ACCESS RIGHT  07/25/2018  . LEFT AND RIGHT HEART CATHETERIZATION WITH CORONARY ANGIOGRAM N/A 11/05/2013   Procedure: LEFT AND RIGHT HEART CATHETERIZATION WITH CORONARY ANGIOGRAM;  Surgeon: Peter M  Martinique, MD;  Location: [facility] CATH LAB;  Service: Cardiovascular;  Laterality: N/A;  . RIGHT HEART CATHETERIZATION N/A 01/14/2014   Procedure: RIGHT HEART CATH;  Surgeon: Larey Dresser, MD;  Location: [facility] CATH LAB;  Service: Cardiovascular;  Laterality: N/A;    There were no vitals filed for this visit.  Subjective Assessment - 07/06/19 0717    Subjective  Pt reports that he is doing well.    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 8/31-9/3 then inpatient rehab 9/3-9/23.PMH: CHF, HTN, hyperlipidemia, glomerulonephritis, pancytopenia/chronic anemia, seizure left-sided post stroke    Patient Stated Goals  Pt wants to be able to walk again.    Currently in Pain?  No/denies                       [facility] Adult PT Treatment/Exercise - 07/06/19 0718      Transfers   Transfers  Sit to Stand;Stand to Sit    Sit to Stand  6: Modified independent (Device/Increase time)    Stand to Sit  6: Modified independent (Device/Increase time)      Ambulation/Gait   Ambulation/Gait  Yes    Ambulation/Gait Assistance  5: Supervision;4: Min guard    Ambulation/Gait Assistance Details  Pt was cued to increase step length    Ambulation Distance (Feet)  [place]  device  None    Gait Pattern  Step-through pattern;Decreased stance time - left    Ambulation Surface  Level;Indoor    Gait Comments  At end of session pt ambulated 345' without AD and without left AFO CGA. Verbal cues to try to push off more at toe off on left.      Neuro Re-ed    Neuro Re-ed Details   Obstacle course without AD with left AFO donned: reciprocal steps over 4 cones, stepping up/down 4" step. Repeated x 6 bouts. Sidestepping over 4 cones x 4 bouts CGA. Removed left AFO and performed following activities: Standing on airex with feet apart x 30 sec with manual pertubations x 30 sec then staggered stance with pertubations x 30 sec  each position. Stepping up on 6" step with right LE to work on push off on left x 10, stepping over  4" foam with right LE to work on left push off. Rockerboard weight shifting ant/post x 10.      Exercises   Exercises  Other Exercises    Other Exercises   Seated left ankle DF/PF with PT supporting lower leg and providing light resistance to PF x 20. Pt performed standing at counter raising up on toes 10 x 2. Cued to try not to push through arms to raise.             PT Education - 07/06/19 1249    Education Details  Pt to continue with current HEP    Person(s) Educated  Patient    Methods  Explanation    Comprehension  Verbalized understanding       PT Short Term Goals - 06/29/19 0805      PT SHORT TERM GOAL #1   Title  Pt will ambulate >400' on varied surfaces with [facility] supervision for improved community mobility. (target date for STGs=07/02/19)    Baseline  Pt has met this goal with simulated indoor surfaces due to bad weather    Time  4    Period  Weeks    Status  Achieved    Target Date  07/02/19      PT SHORT TERM GOAL #2   Title  Pt will increase gait speed from 0.75ms to >0.854m with SPC for improved gait safety.    Baseline  06/02/2019 comfortable =0.6644mand fast=0.89m/40m0.57m/3mmfortable and 1.14m/s38mt on 06/29/19    Time  4    Period  Weeks    Status  Achieved    Target Date  07/02/19      PT SHORT TERM GOAL #3   Title  Pt will be able to perform sit to stand without UE support from standard chair consistently for improved functional strength.    Baseline  not consistent without UE support from chair. Able to perform inconsistently with hands in lap relying on momentum    Time  4    Period  Weeks    Status  On-going    Target Date  07/02/19        PT Long Term Goals - 06/29/19 1207      PT LONG TERM GOAL #1   Title  Pt will ambulate > 500' on varied surfaces with SPC versus no AD mod I for improved community mobility.(Target date fo LTGs=08/01/19)     Baseline  supervision on level and supervision/CGA on nonlevel    Time  8    Period  Weeks    Status  Revised  PT LONG TERM GOAL #2   Title  Pt will increase Berg from 41/56 to > 48/56 for improved balance and decreased fall risk.    Baseline  45/56 on 06/02/19    Time  8    Period  Weeks    Status  On-going      PT LONG TERM GOAL #3   Title  Pt will increase DGI from 16/21 to 19/21 for improved balance and gait safety.    Baseline  16/21 on 06/02/19    Time  8    Period  Weeks    Status  New            Plan - 07/06/19 1250    Clinical Impression Statement  PT continued to progress gait without AD today with patient showing improving stability at pelvis during stance. Pt was also able to show improving ankle control with AFO removed some during session.    Personal Factors and Comorbidities  Comorbidity 3+    Comorbidities  CHF, HTN, hyperlipidemia, glomerulonephritis, pancytopenia/chronic anemia, seizure    Examination-Activity Limitations  Stairs;Stand;Squat;Locomotion Level;Transfers;Bed Mobility    Examination-Participation Restrictions  Community Activity;Shop;Driving;Cleaning;Yard Work    [employer] officer  Evolving/Moderate complexity    Rehab Potential  Good    PT Frequency  2x / week    PT Duration  8 weeks    PT Treatment/Interventions  DME Instruction;ADLs/Self Care Home Management;Gait training;Stair training;Functional mobility training;Therapeutic activities;Therapeutic exercise;Balance training;Orthotic Fit/Training;Patient/family education;Neuromuscular re-education;Wheelchair mobility training;Passive range of motion;Manual techniques    PT Next Visit Plan  Continue BWS over treadmill to allow for decreased UE support. Gait overground without AD and trying some without AFO. Continued left ankle strengthening/stretching. Standing balance with more SLS activities and strengthening to left LE with more focus on glut med.    PT Home Exercise  Plan  Access Code: [email or web address]    Consulted and Agree with Plan of Care  Patient    Family Member Consulted  wife       Patient will benefit from skilled therapeutic intervention in order to improve the following deficits and impairments:  Abnormal gait, Difficulty walking, Decreased balance, Decreased mobility, Decreased strength, Decreased knowledge of use of DME, Decreased endurance, Decreased activity tolerance, Decreased range of motion, Pain, Increased muscle spasms  Visit Diagnosis: Other abnormalities of gait and mobility  Muscle weakness (generalized)     Problem List Patient Active Problem List   Diagnosis Date Noted  . Essential hypertension 05/25/2019  . 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 Acute blood loss anemia   . Steroid-induced hyperglycemia   . Acute on chronic anemia   . Seizures ([place])   . Spastic hemiparesis ([place])   . 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 Hypertension   . Thrombocytopenia ([place])   . Acute unilateral cerebral infarction in a watershed distribution [facility]) 01/15/2019  . Weakness   . CVA (cerebral vascular accident) ([place]) 01/12/2019  . Malnutrition of moderate degree 07/31/2018  . Elevated serum protein level   . Multiple myeloma ([place])   . Pancytopenia ([place])   . Normocytic normochromic anemia 07/16/2018  . Acute kidney failure ([place]) 07/16/2018  . NICM (nonischemic cardiomyopathy) ([place]) 11/12/2013  . History of ETOH abuse 11/12/2013  . Chronic combined systolic and diastolic CHF (congestive heart failure) ([place]) 11/12/2013  . Congestive dilated cardiomyopathy ([place]) 11/04/2013  . Malignant hypertension     Electa Sniff, PT, DPT, NCS 07/06/2019, 12:56 PM  [facility] [address] [place], [place],  [address] Phone: [phone]   Fax:  [phone]  Name: Jeremy Grant MRN: [id number] Date of Birth: 03/27/1961

## 2019-07-08 ENCOUNTER — Other Ambulatory Visit: Payer: Self-pay

## 2019-07-08 ENCOUNTER — Ambulatory Visit (INDEPENDENT_AMBULATORY_CARE_PROVIDER_SITE_OTHER): Payer: BC Managed Care – PPO | Admitting: Neurology

## 2019-07-08 DIAGNOSIS — R569 Unspecified convulsions: Secondary | ICD-10-CM

## 2019-07-09 ENCOUNTER — Telehealth: Payer: Self-pay | Admitting: *Deleted

## 2019-07-09 ENCOUNTER — Ambulatory Visit: Payer: BC Managed Care – PPO

## 2019-07-09 NOTE — Telephone Encounter (Signed)
Relayed to wife of pt that EEG results normal study.  She verbalized understanding.

## 2019-07-10 ENCOUNTER — Ambulatory Visit: Payer: BC Managed Care – PPO

## 2019-07-10 ENCOUNTER — Other Ambulatory Visit: Payer: Self-pay

## 2019-07-10 DIAGNOSIS — M6281 Muscle weakness (generalized): Secondary | ICD-10-CM

## 2019-07-10 DIAGNOSIS — R2689 Other abnormalities of gait and mobility: Secondary | ICD-10-CM

## 2019-07-10 NOTE — Therapy (Signed)
[facility] [address] [place], [place], [address] Phone: [phone]   Fax:  [phone]  Physical Therapy Treatment  Patient Details  Name: Jeremy Grant MRN: [id number] Date of Birth: 09/14/1960 Referring Provider (PT): Dr. Letta Pate   Encounter Date: 07/10/2019  PT End of Session - 07/10/19 0804    Visit Number  39    Number of Visits  45    Date for PT Re-Evaluation  08/31/19    Authorization Type  BCBS    PT Start Time  0801    PT Stop Time  0844    PT Time Calculation (min)  43 min    Equipment Utilized During Treatment  Gait belt   left AFO   Activity Tolerance  Patient tolerated treatment well    Behavior During Therapy  [facility] for tasks assessed/performed       Past Medical History:  Diagnosis Date  . Arthritis   . CHF (congestive heart failure) ([place])   . Combined systolic and diastolic cardiac dysfunction    Echo 11/03/2013 EF 46%, grade 3 diastolic dysfunction  . Hypertension   . NICM (nonischemic cardiomyopathy) ([place])    a. L/RHC (11/05/13): RA: 3, RV 52/5, PA 49/19 (31), PCWP 10, AO 166/93, PA 67%, Fick CO/CI: 5.71/2.97, Lmain: normal, LAD: large, without signficant dz, first diagonal has 20% dz at ostium, LCx: normal, RCA: 30% stenosis at the bifurcation of PDA and PLOM    Past Surgical History:  Procedure Laterality Date  . BIOPSY  01/31/2019   Procedure: BIOPSY;  Surgeon: Otis Brace, MD;  Location: [facility];  Service: Gastroenterology;;  . ESOPHAGOGASTRODUODENOSCOPY N/A 01/31/2019   Procedure: ESOPHAGOGASTRODUODENOSCOPY (EGD);  Surgeon: Otis Brace, MD;  Location: [facility] ENDOSCOPY;  Service: Gastroenterology;  Laterality: N/A;  . IR FLUORO GUIDE CV LINE RIGHT  07/25/2018  . IR US GUIDE VASC ACCESS RIGHT  07/25/2018  . LEFT AND RIGHT HEART CATHETERIZATION WITH CORONARY ANGIOGRAM N/A 11/05/2013   Procedure: LEFT AND RIGHT HEART CATHETERIZATION WITH CORONARY ANGIOGRAM;  Surgeon: Peter M  Martinique, MD;  Location: [facility] CATH LAB;  Service: Cardiovascular;  Laterality: N/A;  . RIGHT HEART CATHETERIZATION N/A 01/14/2014   Procedure: RIGHT HEART CATH;  Surgeon: Larey Dresser, MD;  Location: [facility] CATH LAB;  Service: Cardiovascular;  Laterality: N/A;    There were no vitals filed for this visit.  Subjective Assessment - 07/10/19 0805    Subjective  Pt had normal result on EEG. Doing well. Went out yesterday with wife and practiced driving as has been cleared by MD.    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 8/31-9/3 then inpatient rehab 9/3-9/23.PMH: CHF, HTN, hyperlipidemia, glomerulonephritis, pancytopenia/chronic anemia, seizure left-sided post stroke    Patient Stated Goals  Pt wants to be able to walk again.    Currently in Pain?  No/denies                       [facility] Adult PT Treatment/Exercise - 07/10/19 0806      Ambulation/Gait   Ambulation/Gait  Yes    Ambulation/Gait Assistance  6: Modified independent (Device/Increase time);5: Supervision;4: Min guard    Ambulation/Gait Assistance Details  Pt was instructed to increase step length bilateral. Assistance increased to CGA with gait over grass without AD.    Ambulation Distance (Feet)  1000 Feet    Assistive device  Straight cane;None    Gait Pattern  Step-through pattern;Decreased step length - right    Ambulation Surface  Outdoor;Paved;Grass;Unlevel;Level    Curb  5: Supervision    Curb Details (indicate cue type and reason)  Performed with cane x 2 including from grass to curb then x 1 without AD. Close SBA/CGA on curb without AD.    Gait Comments  BWS over treadmill 5 min x 2 at 1.22mh with 1 min warm up with 2 hands, 1 min with 1 hand then 3 min without UE support each bout. PT at trunk during no UE support. Verbal cues to increase step length and try to be more gentle with right foot placement with no UE support periods  as steps shortened some and quick right step. BP=158/88 after.             PT Education - 07/10/19 1057    Education Details  Pt to continue with current HEP    Person(s) Educated  Patient    Methods  Explanation    Comprehension  Verbalized understanding       PT Short Term Goals - 06/29/19 0805      PT SHORT TERM GOAL #1   Title  Pt will ambulate >400' on varied surfaces with [facility] for improved community mobility. (target date for STGs=07/02/19)    Baseline  Pt has met this goal with simulated indoor surfaces due to bad weather    Time  4    Period  Weeks    Status  Achieved    Target Date  07/02/19      PT SHORT TERM GOAL #2   Title  Pt will increase gait speed from 0.667m to >0.3m75mwith SPC for improved gait safety.    Baseline  06/02/2019 comfortable =0.75m52mnd fast=0.79m/s41m.11m/s679mfortable and 1.32m/s 83m on 06/29/19    Time  4    Period  Weeks    Status  Achieved    Target Date  07/02/19      PT SHORT TERM GOAL #3   Title  Pt will be able to perform sit to stand without UE support from standard chair consistently for improved functional strength.    Baseline  not consistent without UE support from chair. Able to perform inconsistently with hands in lap relying on momentum    Time  4    Period  Weeks    Status  On-going    Target Date  07/02/19        PT Long Term Goals - 06/29/19 1207      PT LONG TERM GOAL #1   Title  Pt will ambulate > 500' on varied surfaces with SPC versus no AD mod I for improved community mobility.(Target date fo LTGs=08/01/19)    Baseline  supervision on level and supervision/CGA on nonlevel    Time  8    Period  Weeks    Status  Revised      PT LONG TERM GOAL #2   Title  Pt will increase Berg from 41/56 to > 48/56 for improved balance and decreased fall risk.    Baseline  45/56 on 06/02/19    Time  8    Period  Weeks    Status  On-going      PT LONG TERM GOAL #3   Title  Pt will increase DGI from 16/21 to  19/21 for improved balance and gait safety.    Baseline  16/21 on 06/02/19    Time  8    Period  Weeks    Status  New            Plan - 07/10/19 1057    Clinical Impression Statement  PT focused on gait training on varied surfaces with SPC and without today. Pt only needed occasional CGA without AD on nonlevel surfaces. Showing improving step lengths with gait on treadmill in BWS only for safety with no UE support.    Personal Factors and Comorbidities  Comorbidity 3+    Comorbidities  CHF, HTN, hyperlipidemia, glomerulonephritis, pancytopenia/chronic anemia, seizure    Examination-Activity Limitations  Stairs;Stand;Squat;Locomotion Level;Transfers;Bed Mobility    Examination-Participation Restrictions  Community Activity;Shop;Driving;Cleaning;Yard Work    [employer] officer  Evolving/Moderate complexity    Rehab Potential  Good    PT Frequency  2x / week    PT Duration  8 weeks    PT Treatment/Interventions  DME Instruction;ADLs/Self Care Home Management;Gait training;Stair training;Functional mobility training;Therapeutic activities;Therapeutic exercise;Balance training;Orthotic Fit/Training;Patient/family education;Neuromuscular re-education;Wheelchair mobility training;Passive range of motion;Manual techniques    PT Next Visit Plan  Continue BWS over treadmill to allow for decreased UE support. Gait overground without AD and trying some without AFO. Continued left ankle strengthening/stretching. Standing balance with more SLS activities and strengthening to left LE with more focus on glut med.    PT Home Exercise Plan  Access Code: [email or web address]    Consulted and Agree with Plan of Care  Patient    Family Member Consulted  wife       Patient will benefit from skilled therapeutic intervention in order to improve the following deficits and impairments:  Abnormal gait, Difficulty walking, Decreased balance, Decreased mobility, Decreased strength, Decreased knowledge of use of  DME, Decreased endurance, Decreased activity tolerance, Decreased range of motion, Pain, Increased muscle spasms  Visit Diagnosis: Other abnormalities of gait and mobility  Muscle weakness (generalized)     Problem List Patient Active Problem List   Diagnosis Date Noted  . Essential hypertension 05/25/2019  . 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 Acute blood loss anemia   . Steroid-induced hyperglycemia   . Acute on chronic anemia   . Seizures ([place])   . Spastic hemiparesis ([place])   . 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 Hypertension   . Thrombocytopenia ([place])   . Acute unilateral cerebral infarction in a watershed distribution [facility]) 01/15/2019  . Weakness   . CVA (cerebral vascular accident) ([place]) 01/12/2019  . Malnutrition of moderate degree 07/31/2018  . Elevated serum protein level   . Multiple myeloma ([place])   . Pancytopenia ([place])   . Normocytic normochromic anemia 07/16/2018  . Acute kidney failure ([place]) 07/16/2018  . NICM (nonischemic cardiomyopathy) ([place]) 11/12/2013  . History of ETOH abuse 11/12/2013  . Chronic combined systolic and diastolic CHF (congestive heart failure) ([place]) 11/12/2013  . Congestive dilated cardiomyopathy ([place]) 11/04/2013  . Malignant hypertension     Electa Sniff, PT, DPT, NCS 07/10/2019, 10:59 AM  [facility] [address] [place], [place], [address] Phone: [phone]   Fax:  [phone]  Name: KELDEN LAVALLEE MRN: [id number] Date of Birth: 02-18-1961

## 2019-07-13 ENCOUNTER — Other Ambulatory Visit: Payer: Self-pay

## 2019-07-13 ENCOUNTER — Ambulatory Visit: Payer: BC Managed Care – PPO | Attending: Physical Medicine & Rehabilitation

## 2019-07-13 DIAGNOSIS — R2689 Other abnormalities of gait and mobility: Secondary | ICD-10-CM | POA: Insufficient documentation

## 2019-07-13 DIAGNOSIS — M6281 Muscle weakness (generalized): Secondary | ICD-10-CM | POA: Insufficient documentation

## 2019-07-13 NOTE — Therapy (Signed)
[facility] [address] [place], [place], [address] Phone: [phone]   Fax:  [phone]  Physical Therapy Treatment  Patient Details  Name: RIDDIK SENNA MRN: [id number] Date of Birth: January 15, 1961 Referring Provider (PT): Dr. Letta Pate   Encounter Date: 07/13/2019  PT End of Session - 07/13/19 0801    Visit Number  40    Number of Visits  45    Date for PT Re-Evaluation  08/31/19    Authorization Type  BCBS    PT Start Time  0800    PT Stop Time  0843    PT Time Calculation (min)  43 min    Equipment Utilized During Treatment  Gait belt   left AFO   Activity Tolerance  Patient tolerated treatment well    Behavior During Therapy  [facility] for tasks assessed/performed       Past Medical History:  Diagnosis Date  . Arthritis   . CHF (congestive heart failure) ([place])   . Combined systolic and diastolic cardiac dysfunction    Echo 11/03/2013 EF 52%, grade 3 diastolic dysfunction  . Hypertension   . NICM (nonischemic cardiomyopathy) ([place])    a. L/RHC (11/05/13): RA: 3, RV 52/5, PA 49/19 (31), PCWP 10, AO 166/93, PA 67%, Fick CO/CI: 5.71/2.97, Lmain: normal, LAD: large, without signficant dz, first diagonal has 20% dz at ostium, LCx: normal, RCA: 30% stenosis at the bifurcation of PDA and PLOM    Past Surgical History:  Procedure Laterality Date  . BIOPSY  01/31/2019   Procedure: BIOPSY;  Surgeon: Otis Brace, MD;  Location: [facility]OSCOPY;  Service: Gastroenterology;;  . ESOPHAGOGASTRODUODENOSCOPY N/A 01/31/2019   Procedure: ESOPHAGOGASTRODUODENOSCOPY (EGD);  Surgeon: Otis Brace, MD;  Location: [facility] ENDOSCOPY;  Service: Gastroenterology;  Laterality: N/A;  . IR FLUORO GUIDE CV LINE RIGHT  07/25/2018  . IR US GUIDE VASC ACCESS RIGHT  07/25/2018  . LEFT AND RIGHT HEART CATHETERIZATION WITH CORONARY ANGIOGRAM N/A 11/05/2013   Procedure: LEFT AND RIGHT HEART CATHETERIZATION WITH CORONARY ANGIOGRAM;  Surgeon: Peter M  Martinique, MD;  Location: [facility] CATH LAB;  Service: Cardiovascular;  Laterality: N/A;  . RIGHT HEART CATHETERIZATION N/A 01/14/2014   Procedure: RIGHT HEART CATH;  Surgeon: Larey Dresser, MD;  Location: [facility] CATH LAB;  Service: Cardiovascular;  Laterality: N/A;    There were no vitals filed for this visit.  Subjective Assessment - 07/13/19 0801    Subjective  Pt reports that he is doing well. He did have a fall the other day after therapy Friday when he was playing with the dog and trying to get his squeaky out from under the table and fell when bending down.    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 8/31-9/3 then inpatient rehab 9/3-9/23.PMH: CHF, HTN, hyperlipidemia, glomerulonephritis, pancytopenia/chronic anemia, seizure left-sided post stroke    Patient Stated Goals  Pt wants to be able to walk again.    Currently in Pain?  No/denies                       [facility] Adult PT Treatment/Exercise - 07/13/19 0804      Ambulation/Gait   Ambulation/Gait  Yes    Ambulation/Gait Assistance  5: Supervision;4: Min guard    Ambulation/Gait Assistance Details  Pt was instructed to increase step length.     Ambulation Distance (Feet)  230 Feet    Assistive device  None  left AFO   Gait Pattern  Step-through pattern;Decreased stance time - left    Ambulation Surface  Level;Indoor    Gait Comments  Pt ambulated another 80' without AD with no AFO CGA at end of session with cues to increase step length and try to get more heel to toe pattern.      Neuro Re-ed    Neuro Re-ed Details   Dynamic gait activities in hallway without AD with left AFO donned: 73' with head turns left/right, 45' with head turns up/down, 3' with marching with verbal cues to control stance time, 66' with large steps. Rockerboard positioned ant/ post weight shifting x 10, mainitaining level x 30 sec eyes open then 30 sec x 2 eyes  closed CGA/min assist initially losing balance anterior. Stepping over 2x 4 foam with each foot x 10 working on push off on left. Pt had more difficulty with stepping back with right shifting to left.  Stepping over foam lateral x 10 then x 5 with right UE support and cues to control foot placement.      Exercises   Exercises  Other Exercises    Other Exercises   At counter: raising up on toes x 10, walking on toes 6' x 6, raises toes up x 10 with limited range on left. At steps: standing with balls of feet on step and lowering down and raising up x 10, left hip flexion with 3# ankle weight to 2nd step x 10.  left hamstring curl x 10 with 3# ankle weight, left hip ext x 10 with 3# weight with verbal and tactile cues for form.             PT Education - 07/13/19 1219    Education Details  Pt to continue with current HEP    Person(s) Educated  Patient    Methods  Explanation    Comprehension  Verbalized understanding       PT Short Term Goals - 06/29/19 0805      PT SHORT TERM GOAL #1   Title  Pt will ambulate >400' on varied surfaces with [facility] supervision for improved community mobility. (target date for STGs=07/02/19)    Baseline  Pt has met this goal with simulated indoor surfaces due to bad weather    Time  4    Period  Weeks    Status  Achieved    Target Date  07/02/19      PT SHORT TERM GOAL #2   Title  Pt will increase gait speed from 0.71ms to >0.823m with SPC for improved gait safety.    Baseline  06/02/2019 comfortable =0.6655mand fast=0.44m/81m0.4m/81mmfortable and 1.27m/s7mt on 06/29/19    Time  4    Period  Weeks    Status  Achieved    Target Date  07/02/19      PT SHORT TERM GOAL #3   Title  Pt will be able to perform sit to stand without UE support from standard chair consistently for improved functional strength.    Baseline  not consistent without UE support from chair. Able to perform inconsistently with hands in lap relying on momentum    Time  4     Period  Weeks    Status  On-going    Target Date  07/02/19        PT Long Term Goals - 06/29/19 1207      PT LONG TERM GOAL #1   Title  Pt will  ambulate > 500' on varied surfaces with SPC versus no AD mod I for improved community mobility.(Target date fo LTGs=08/01/19)    Baseline  supervision on level and supervision/CGA on nonlevel    Time  8    Period  Weeks    Status  Revised      PT LONG TERM GOAL #2   Title  Pt will increase Berg from 41/56 to > 48/56 for improved balance and decreased fall risk.    Baseline  45/56 on 06/02/19    Time  8    Period  Weeks    Status  On-going      PT LONG TERM GOAL #3   Title  Pt will increase DGI from 16/21 to 19/21 for improved balance and gait safety.    Baseline  16/21 on 06/02/19    Time  8    Period  Weeks    Status  New            Plan - 07/13/19 1221    Clinical Impression Statement  PT focused on ankle and hip strengthening during session today. Pt's ankle weakness does decrease toe off with gait.    Personal Factors and Comorbidities  Comorbidity 3+    Comorbidities  CHF, HTN, hyperlipidemia, glomerulonephritis, pancytopenia/chronic anemia, seizure    Examination-Activity Limitations  Stairs;Stand;Squat;Locomotion Level;Transfers;Bed Mobility    Examination-Participation Restrictions  Community Activity;Shop;Driving;Cleaning;Yard Work    [employer] officer  Evolving/Moderate complexity    Rehab Potential  Good    PT Frequency  2x / week    PT Duration  8 weeks    PT Treatment/Interventions  DME Instruction;ADLs/Self Care Home Management;Gait training;Stair training;Functional mobility training;Therapeutic activities;Therapeutic exercise;Balance training;Orthotic Fit/Training;Patient/family education;Neuromuscular re-education;Wheelchair mobility training;Passive range of motion;Manual techniques    PT Next Visit Plan  Continue BWS over treadmill to allow for decreased UE support. Gait overground without AD  and trying some without AFO. Continued left ankle strengthening/stretching. Standing balance with more SLS activities and strengthening to left LE with more focus on glut med.    PT Home Exercise Plan  Access Code: [email or web address]    Consulted and Agree with Plan of Care  Patient    Family Member Consulted  wife       Patient will benefit from skilled therapeutic intervention in order to improve the following deficits and impairments:  Abnormal gait, Difficulty walking, Decreased balance, Decreased mobility, Decreased strength, Decreased knowledge of use of DME, Decreased endurance, Decreased activity tolerance, Decreased range of motion, Pain, Increased muscle spasms  Visit Diagnosis: Other abnormalities of gait and mobility  Muscle weakness (generalized)     Problem List Patient Active Problem List   Diagnosis Date Noted  . Essential hypertension 05/25/2019  . 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 Acute blood loss anemia   . Steroid-induced hyperglycemia   . Acute on chronic anemia   . Seizures ([place])   . Spastic hemiparesis ([place])   . 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 Hypertension   . Thrombocytopenia ([place])   . Acute unilateral cerebral infarction in a watershed distribution [facility]) 01/15/2019  . Weakness   . CVA (cerebral vascular accident) ([place]) 01/12/2019  . Malnutrition of moderate degree 07/31/2018  . Elevated serum protein level   . Multiple myeloma ([place])   . Pancytopenia ([place])   . Normocytic normochromic anemia 07/16/2018  . Acute kidney failure ([place]) 07/16/2018  . NICM (nonischemic cardiomyopathy) ([place]) 11/12/2013  . History of ETOH abuse 11/12/2013  . Chronic combined systolic and diastolic CHF (congestive  heart failure) ([place]) 11/12/2013  . Congestive dilated cardiomyopathy ([place]) 11/04/2013  . Malignant hypertension     Electa Sniff, PT, DPT, NCS 07/13/2019, 12:22 PM  [facility] [address] [place], [place],  [address] Phone: [phone]   Fax:  [phone]  Name: TYMIER LINDHOLM MRN: [id number] Date of Birth: 1961/04/27

## 2019-07-16 ENCOUNTER — Encounter: Payer: BC Managed Care – PPO | Admitting: Physical Medicine & Rehabilitation

## 2019-07-17 ENCOUNTER — Ambulatory Visit: Payer: BC Managed Care – PPO

## 2019-07-17 ENCOUNTER — Other Ambulatory Visit: Payer: Self-pay

## 2019-07-17 DIAGNOSIS — M6281 Muscle weakness (generalized): Secondary | ICD-10-CM

## 2019-07-17 DIAGNOSIS — R2689 Other abnormalities of gait and mobility: Secondary | ICD-10-CM

## 2019-07-17 NOTE — Therapy (Signed)
[facility] [address] [place], [place], [address] Phone: [phone]   Fax:  [phone]  Physical Therapy Treatment  Patient Details  Name: Jeremy Grant MRN: [id number] Date of Birth: Jan 19, 1961 Referring Provider (PT): Dr. Letta Pate   Encounter Date: 07/17/2019  PT End of Session - 07/17/19 0806    Visit Number  41    Number of Visits  45    Date for PT Re-Evaluation  08/31/19    Authorization Type  BCBS    PT Start Time  0802    PT Stop Time  0844    PT Time Calculation (min)  42 min    Equipment Utilized During Treatment  Gait belt   left AFO   Activity Tolerance  Patient tolerated treatment well    Behavior During Therapy  [facility] for tasks assessed/performed       Past Medical History:  Diagnosis Date  . Arthritis   . CHF (congestive heart failure) ([place])   . Combined systolic and diastolic cardiac dysfunction    Echo 11/03/2013 EF 82%, grade 3 diastolic dysfunction  . Hypertension   . NICM (nonischemic cardiomyopathy) ([place])    a. L/RHC (11/05/13): RA: 3, RV 52/5, PA 49/19 (31), PCWP 10, AO 166/93, PA 67%, Fick CO/CI: 5.71/2.97, Lmain: normal, LAD: large, without signficant dz, first diagonal has 20% dz at ostium, LCx: normal, RCA: 30% stenosis at the bifurcation of PDA and PLOM    Past Surgical History:  Procedure Laterality Date  . BIOPSY  01/31/2019   Procedure: BIOPSY;  Surgeon: Otis Brace, MD;  Location: [facility] ENDOSCOPY;  Service: Gastroenterology;;  . ESOPHAGOGASTRODUODENOSCOPY N/A 01/31/2019   Procedure: ESOPHAGOGASTRODUODENOSCOPY (EGD);  Surgeon: Otis Brace, MD;  Location: [facility] ENDOSCOPY;  Service: Gastroenterology;  Laterality: N/A;  . IR FLUORO GUIDE CV LINE RIGHT  07/25/2018  . IR US GUIDE VASC ACCESS RIGHT  07/25/2018  . LEFT AND RIGHT HEART CATHETERIZATION WITH CORONARY ANGIOGRAM N/A 11/05/2013   Procedure: LEFT AND RIGHT HEART CATHETERIZATION WITH CORONARY ANGIOGRAM;  Surgeon: Peter M  Martinique, MD;  Location: [facility] CATH LAB;  Service: Cardiovascular;  Laterality: N/A;  . RIGHT HEART CATHETERIZATION N/A 01/14/2014   Procedure: RIGHT HEART CATH;  Surgeon: Larey Dresser, MD;  Location: [facility] CATH LAB;  Service: Cardiovascular;  Laterality: N/A;    There were no vitals filed for this visit.  Subjective Assessment - 07/17/19 0806    Subjective  Pt drove himself to therapy today.    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 8/31-9/3 then inpatient rehab 9/3-9/23.PMH: CHF, HTN, hyperlipidemia, glomerulonephritis, pancytopenia/chronic anemia, seizure left-sided post stroke    Patient Stated Goals  Pt wants to be able to walk again.    Currently in Pain?  No/denies                       [facility] Adult PT Treatment/Exercise - 07/17/19 0807      Ambulation/Gait   Ambulation/Gait  Yes    Ambulation/Gait Assistance  5: Supervision    Ambulation/Gait Assistance Details  Pt performed marching gait for 115'. Pt was given cues to increase step length and be sure to tighten left gluts with stance time as well as try to push off on left.    Ambulation Distance (Feet)  345 Feet   250' at end of session without AD   Assistive device  None   left AFO  Gait Pattern  Step-through pattern;Decreased stance time - left    Ambulation Surface  Level;Indoor    Ramp  5: Supervision    Ramp Details (indicate cue type and reason)  performed x 3 without AD    Gait Comments  Gait on treadmill 6 min x 2 with 1 min both hands, 1 min 1 hand and remaining no UE support each time at 1.21mh. Pt was given verbal cues to increase step length and relax arms to get more arm swing. PT provided light tactile assist at trunk for weight shift. BP=152/80 after gait             PT Education - 07/17/19 1106    Education Details  Pt to continue with current HEP    Person(s) Educated  Patient    Methods  Explanation     Comprehension  Verbalized understanding       PT Short Term Goals - 06/29/19 0805      PT SHORT TERM GOAL #1   Title  Pt will ambulate >400' on varied surfaces with [facility] for improved community mobility. (target date for STGs=07/02/19)    Baseline  Pt has met this goal with simulated indoor surfaces due to bad weather    Time  4    Period  Weeks    Status  Achieved    Target Date  07/02/19      PT SHORT TERM GOAL #2   Title  Pt will increase gait speed from 0.661m to >0.54m754mwith SPC for improved gait safety.    Baseline  06/02/2019 comfortable =0.61m72mnd fast=0.77m/s32m.54m/s677mfortable and 1.28m/s 65m on 06/29/19    Time  4    Period  Weeks    Status  Achieved    Target Date  07/02/19      PT SHORT TERM GOAL #3   Title  Pt will be able to perform sit to stand without UE support from standard chair consistently for improved functional strength.    Baseline  not consistent without UE support from chair. Able to perform inconsistently with hands in lap relying on momentum    Time  4    Period  Weeks    Status  On-going    Target Date  07/02/19        PT Long Term Goals - 06/29/19 1207      PT LONG TERM GOAL #1   Title  Pt will ambulate > 500' on varied surfaces with SPC versus no AD mod I for improved community mobility.(Target date fo LTGs=08/01/19)    Baseline  supervision on level and supervision/CGA on nonlevel    Time  8    Period  Weeks    Status  Revised      PT LONG TERM GOAL #2   Title  Pt will increase Berg from 41/56 to > 48/56 for improved balance and decreased fall risk.    Baseline  45/56 on 06/02/19    Time  8    Period  Weeks    Status  On-going      PT LONG TERM GOAL #3   Title  Pt will increase DGI from 16/21 to 19/21 for improved balance and gait safety.    Baseline  16/21 on 06/02/19    Time  8    Period  Weeks    Status  New            Plan - 07/17/19 1106    Clinical  Impression Statement  PT focused on gait without AD  today over ground and on treadmill. BWS was utilized over treadmill just for safety with no weight reduced. Pt showing improving control without UE support on treadmill with longer steps.    Personal Factors and Comorbidities  Comorbidity 3+    Comorbidities  CHF, HTN, hyperlipidemia, glomerulonephritis, pancytopenia/chronic anemia, seizure    Examination-Activity Limitations  Stairs;Stand;Squat;Locomotion Level;Transfers;Bed Mobility    Examination-Participation Restrictions  Community Activity;Shop;Driving;Cleaning;Yard Work    [employer] officer  Evolving/Moderate complexity    Rehab Potential  Good    PT Frequency  2x / week    PT Duration  8 weeks    PT Treatment/Interventions  DME Instruction;ADLs/Self Care Home Management;Gait training;Stair training;Functional mobility training;Therapeutic activities;Therapeutic exercise;Balance training;Orthotic Fit/Training;Patient/family education;Neuromuscular re-education;Wheelchair mobility training;Passive range of motion;Manual techniques    PT Next Visit Plan  Continue BWS over treadmill to allow for decreased UE support. Gait overground without AD and trying some without AFO. Continued left ankle strengthening/stretching. Standing balance with more SLS activities and strengthening to left LE with more focus on glut med.    PT Home Exercise Plan  Access Code: [email or web address]    Consulted and Agree with Plan of Care  Patient    Family Member Consulted  wife       Patient will benefit from skilled therapeutic intervention in order to improve the following deficits and impairments:  Abnormal gait, Difficulty walking, Decreased balance, Decreased mobility, Decreased strength, Decreased knowledge of use of DME, Decreased endurance, Decreased activity tolerance, Decreased range of motion, Pain, Increased muscle spasms  Visit Diagnosis: Other abnormalities of gait and mobility  Muscle weakness (generalized)     Problem List Patient  Active Problem List   Diagnosis Date Noted  . Essential hypertension 05/25/2019  . 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 Acute blood loss anemia   . Steroid-induced hyperglycemia   . Acute on chronic anemia   . Seizures ([place])   . Spastic hemiparesis ([place])   . 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 Hypertension   . Thrombocytopenia ([place])   . Acute unilateral cerebral infarction in a watershed distribution [facility]) 01/15/2019  . Weakness   . CVA (cerebral vascular accident) ([place]) 01/12/2019  . Malnutrition of moderate degree 07/31/2018  . Elevated serum protein level   . Multiple myeloma ([place])   . Pancytopenia ([place])   . Normocytic normochromic anemia 07/16/2018  . Acute kidney failure ([place]) 07/16/2018  . NICM (nonischemic cardiomyopathy) ([place]) 11/12/2013  . History of ETOH abuse 11/12/2013  . Chronic combined systolic and diastolic CHF (congestive heart failure) ([place]) 11/12/2013  . Congestive dilated cardiomyopathy ([place]) 11/04/2013  . Malignant hypertension     Electa Sniff, PT, DPT, NCS 07/17/2019, 11:08 AM  [facility] [address] [place], [place], [address] Phone: [phone]   Fax:  [phone]  Name: CONNOR MEACHAM MRN: [id number] Date of Birth: May 22, 1960

## 2019-07-20 ENCOUNTER — Ambulatory Visit: Payer: BC Managed Care – PPO

## 2019-07-20 ENCOUNTER — Other Ambulatory Visit: Payer: Self-pay

## 2019-07-20 ENCOUNTER — Encounter (HOSPITAL_COMMUNITY): Payer: BC Managed Care – PPO

## 2019-07-20 ENCOUNTER — Encounter (HOSPITAL_COMMUNITY)
Admission: RE | Admit: 2019-07-20 | Discharge: 2019-07-20 | Disposition: A | Discharge status: Home / Self Care | Payer: BC Managed Care – PPO | Source: Ambulatory Visit | Attending: Nephrology | Admitting: Nephrology

## 2019-07-20 VITALS — BP 158/82 | HR 63 | Temp 97.2°F | Resp 18

## 2019-07-20 DIAGNOSIS — N179 Acute kidney failure, unspecified: Secondary | ICD-10-CM

## 2019-07-20 DIAGNOSIS — M6281 Muscle weakness (generalized): Secondary | ICD-10-CM

## 2019-07-20 DIAGNOSIS — R2689 Other abnormalities of gait and mobility: Secondary | ICD-10-CM

## 2019-07-20 LAB — POCT HEMOGLOBIN-HEMACUE: Hemoglobin: 13.8 g/dL (ref 13.0–17.0)

## 2019-07-20 MED ORDER — EPOETIN ALFA-EPBX 10000 UNIT/ML IJ SOLN: 20000.0000 [IU] | INTRAMUSCULAR | Status: DC

## 2019-07-20 NOTE — Therapy (Signed)
[facility] [address] [place], [place], [address] Phone: [phone]   Fax:  [phone]  Physical Therapy Treatment  Patient Details  Name: Jeremy Grant MRN: [id number] Date of Birth: [birth year] Referring Provider (PT): Dr. Letta Pate   Encounter Date: 07/20/2019  PT End of Session - 07/20/19 0803    Visit Number  42    Number of Visits  45    Date for PT Re-Evaluation  08/31/19    Authorization Type  BCBS    PT Start Time  0800    PT Stop Time  0844    PT Time Calculation (min)  44 min    Equipment Utilized During Treatment  Gait belt   left AFO   Activity Tolerance  Patient tolerated treatment well    Behavior During Therapy  [facility] for tasks assessed/performed       Past Medical History:  Diagnosis Date  . Arthritis   . CHF (congestive heart failure) ([place])   . Combined systolic and diastolic cardiac dysfunction    Echo 11/03/2013 EF 93%, grade 3 diastolic dysfunction  . Hypertension   . NICM (nonischemic cardiomyopathy) ([place])    a. L/RHC (11/05/13): RA: 3, RV 52/5, PA 49/19 (31), PCWP 10, AO 166/93, PA 67%, Fick CO/CI: 5.71/2.97, Lmain: normal, LAD: large, without signficant dz, first diagonal has 20% dz at ostium, LCx: normal, RCA: 30% stenosis at the bifurcation of PDA and PLOM    Past Surgical History:  Procedure Laterality Date  . BIOPSY  01/31/2019   Procedure: BIOPSY;  Surgeon: Otis Brace, MD;  Location: [facility] ENDOSCOPY;  Service: Gastroenterology;;  . ESOPHAGOGASTRODUODENOSCOPY N/A 01/31/2019   Procedure: ESOPHAGOGASTRODUODENOSCOPY (EGD);  Surgeon: Otis Brace, MD;  Location: [facility] ENDOSCOPY;  Service: Gastroenterology;  Laterality: N/A;  . IR FLUORO GUIDE CV LINE RIGHT  07/25/2018  . IR US GUIDE VASC ACCESS RIGHT  07/25/2018  . LEFT AND RIGHT HEART CATHETERIZATION WITH CORONARY ANGIOGRAM N/A 11/05/2013   Procedure: LEFT AND RIGHT HEART CATHETERIZATION WITH CORONARY ANGIOGRAM;  Surgeon: Peter M  Martinique, MD;  Location: [facility] CATH LAB;  Service: Cardiovascular;  Laterality: N/A;  . RIGHT HEART CATHETERIZATION N/A 01/14/2014   Procedure: RIGHT HEART CATH;  Surgeon: Larey Dresser, MD;  Location: [facility] CATH LAB;  Service: Cardiovascular;  Laterality: N/A;    There were no vitals filed for this visit.  Subjective Assessment - 07/20/19 0802    Subjective  Pt reports that he is doing well.    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 8/31-9/3 then inpatient rehab 9/3-9/23.PMH: CHF, HTN, hyperlipidemia, glomerulonephritis, pancytopenia/chronic anemia, seizure left-sided post stroke    Patient Stated Goals  Pt wants to be able to walk again.    Currently in Pain?  No/denies                       [facility] Adult PT Treatment/Exercise - 07/20/19 0803      Ambulation/Gait   Ambulation/Gait  Yes    Gait Pattern  Decreased step length - right;Decreased stance time - left;Step-through pattern    Gait Comments  BWS over treadmill 7 min x 2 with 1 min 2 hands, 1 min 1 hand and then 5 min without UE support each time. 1.2mh first bout and 1.4 mph second bout.  Pt was given verbal cues to increase step length throughout as shortened right step at times. BP=160/90 after. BWS was  only used for safety with no unweighting.      Neuro Re-ed    Neuro Re-ed Details   Rockerboard positioned ant/post trying to maintain level x 30 sec, rocking board x 10 with UE support then performing  x 10 without UE support, standing on half foam trying to maintain level 30 sec x 2. Pt was challenged with standing on half foam but is showing more use of ankle strategy.             PT Education - 07/20/19 1711    Education Details  Pt to continue with current HEP    Person(s) Educated  Patient    Methods  Explanation    Comprehension  Verbalized understanding       PT Short Term Goals - 06/29/19 0805      PT SHORT  TERM GOAL #1   Title  Pt will ambulate >400' on varied surfaces with [facility] supervision for improved community mobility. (target date for STGs=07/02/19)    Baseline  Pt has met this goal with simulated indoor surfaces due to bad weather    Time  4    Period  Weeks    Status  Achieved    Target Date  07/02/19      PT SHORT TERM GOAL #2   Title  Pt will increase gait speed from 0.57ms to >0.840m with SPC for improved gait safety.    Baseline  06/02/2019 comfortable =0.6616mand fast=0.55m/22m0.63m/68mmfortable and 1.47m/s38mt on 06/29/19    Time  4    Period  Weeks    Status  Achieved    Target Date  07/02/19      PT SHORT TERM GOAL #3   Title  Pt will be able to perform sit to stand without UE support from standard chair consistently for improved functional strength.    Baseline  not consistent without UE support from chair. Able to perform inconsistently with hands in lap relying on momentum    Time  4    Period  Weeks    Status  On-going    Target Date  07/02/19        PT Long Term Goals - 06/29/19 1207      PT LONG TERM GOAL #1   Title  Pt will ambulate > 500' on varied surfaces with SPC versus no AD mod I for improved community mobility.(Target date fo LTGs=08/01/19)    Baseline  supervision on level and supervision/CGA on nonlevel    Time  8    Period  Weeks    Status  Revised      PT LONG TERM GOAL #2   Title  Pt will increase Berg from 41/56 to > 48/56 for improved balance and decreased fall risk.    Baseline  45/56 on 06/02/19    Time  8    Period  Weeks    Status  On-going      PT LONG TERM GOAL #3   Title  Pt will increase DGI from 16/21 to 19/21 for improved balance and gait safety.    Baseline  16/21 on 06/02/19    Time  8    Period  Weeks    Status  New            Plan - 07/20/19 1711    Clinical Impression Statement  Pt was able to increase speed on treadmill and continues to show improving steps.    Personal Factors and Comorbidities  Comorbidity 3+     Comorbidities  CHF, HTN, hyperlipidemia, glomerulonephritis, pancytopenia/chronic anemia, seizure    Examination-Activity Limitations  Stairs;Stand;Squat;Locomotion Level;Transfers;Bed Mobility    Examination-Participation Restrictions  Community Activity;Shop;Driving;Cleaning;Yard Work    [employer] officer  Evolving/Moderate complexity    Rehab Potential  Good    PT Frequency  2x / week    PT Duration  8 weeks    PT Treatment/Interventions  DME Instruction;ADLs/Self Care Home Management;Gait training;Stair training;Functional mobility training;Therapeutic activities;Therapeutic exercise;Balance training;Orthotic Fit/Training;Patient/family education;Neuromuscular re-education;Wheelchair mobility training;Passive range of motion;Manual techniques    PT Next Visit Plan  Will more visits needs to be scheduled? Continue BWS over treadmill to allow for decreased UE support. Gait overground without AD and trying some without AFO. Continued left ankle strengthening/stretching. Standing balance with more SLS activities and strengthening to left LE with more focus on glut med.    PT Home Exercise Plan  Access Code: [email or web address]    Consulted and Agree with Plan of Care  Patient    Family Member Consulted  wife       Patient will benefit from skilled therapeutic intervention in order to improve the following deficits and impairments:  Abnormal gait, Difficulty walking, Decreased balance, Decreased mobility, Decreased strength, Decreased knowledge of use of DME, Decreased endurance, Decreased activity tolerance, Decreased range of motion, Pain, Increased muscle spasms  Visit Diagnosis: Other abnormalities of gait and mobility  Muscle weakness (generalized)     Problem List Patient Active Problem List   Diagnosis Date Noted  . Essential hypertension 05/25/2019  . 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 Acute blood loss anemia   . Steroid-induced hyperglycemia   . Acute on chronic anemia   . Seizures  ([place])   . Spastic hemiparesis ([place])   . 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 Hypertension   . Thrombocytopenia ([place])   . Acute unilateral cerebral infarction in a watershed distribution [facility]) 01/15/2019  . Weakness   . CVA (cerebral vascular accident) ([place]) 01/12/2019  . Malnutrition of moderate degree 07/31/2018  . Elevated serum protein level   . Multiple myeloma ([place])   . Pancytopenia ([place])   . Normocytic normochromic anemia 07/16/2018  . Acute kidney failure ([place]) 07/16/2018  . NICM (nonischemic cardiomyopathy) ([place]) 11/12/2013  . History of ETOH abuse 11/12/2013  . Chronic combined systolic and diastolic CHF (congestive heart failure) ([place]) 11/12/2013  . Congestive dilated cardiomyopathy ([place]) 11/04/2013  . Malignant hypertension     Electa Sniff, PT, DPT, NCS 07/20/2019, 5:14 PM  [facility] [address] [place], [place], [address] Phone: [phone]   Fax:  [phone]  Name: Jeremy Grant MRN: [id number] Date of Birth: [birth year]

## 2019-07-22 ENCOUNTER — Other Ambulatory Visit: Payer: Self-pay

## 2019-07-22 ENCOUNTER — Ambulatory Visit: Payer: BC Managed Care – PPO

## 2019-07-22 DIAGNOSIS — R2689 Other abnormalities of gait and mobility: Secondary | ICD-10-CM

## 2019-07-22 DIAGNOSIS — M6281 Muscle weakness (generalized): Secondary | ICD-10-CM

## 2019-07-22 NOTE — Therapy (Signed)
[facility] [address] [place], [place], [address] Phone: [phone]   Fax:  [phone]  Physical Therapy Treatment  Patient Details  Name: Jeremy Grant MRN: [id number] Date of Birth: 12/31/1960 Referring Provider (PT): Dr. Letta Pate   Encounter Date: 07/22/2019  PT End of Session - 07/22/19 1018    Visit Number  43    Number of Visits  45    Date for PT Re-Evaluation  08/31/19    Authorization Type  BCBS    PT Start Time  1015    PT Stop Time  1103    PT Time Calculation (min)  48 min    Equipment Utilized During Treatment  Gait belt   left AFO   Activity Tolerance  Patient tolerated treatment well    Behavior During Therapy  [facility] for tasks assessed/performed       Past Medical History:  Diagnosis Date  . Arthritis   . CHF (congestive heart failure) ([place])   . Combined systolic and diastolic cardiac dysfunction    Echo 11/03/2013 EF 85%, grade 3 diastolic dysfunction  . Hypertension   . NICM (nonischemic cardiomyopathy) ([place])    a. L/RHC (11/05/13): RA: 3, RV 52/5, PA 49/19 (31), PCWP 10, AO 166/93, PA 67%, Fick CO/CI: 5.71/2.97, Lmain: normal, LAD: large, without signficant dz, first diagonal has 20% dz at ostium, LCx: normal, RCA: 30% stenosis at the bifurcation of PDA and PLOM    Past Surgical History:  Procedure Laterality Date  . BIOPSY  01/31/2019   Procedure: BIOPSY;  Surgeon: Otis Brace, MD;  Location: [place] ENDOSCOPY;  Service: Gastroenterology;;  . ESOPHAGOGASTRODUODENOSCOPY N/A 01/31/2019   Procedure: ESOPHAGOGASTRODUODENOSCOPY (EGD);  Surgeon: Otis Brace, MD;  Location: [facility] ENDOSCOPY;  Service: Gastroenterology;  Laterality: N/A;  . IR FLUORO GUIDE CV LINE RIGHT  07/25/2018  . IR US GUIDE VASC ACCESS RIGHT  07/25/2018  . LEFT AND RIGHT HEART CATHETERIZATION WITH CORONARY ANGIOGRAM N/A 11/05/2013   Procedure: LEFT AND RIGHT HEART CATHETERIZATION WITH CORONARY ANGIOGRAM;  Surgeon: Peter M  Martinique, MD;  Location: [facility] CATH LAB;  Service: Cardiovascular;  Laterality: N/A;  . RIGHT HEART CATHETERIZATION N/A 01/14/2014   Procedure: RIGHT HEART CATH;  Surgeon: Larey Dresser, MD;  Location: [facility] CATH LAB;  Service: Cardiovascular;  Laterality: N/A;    There were no vitals filed for this visit.  Subjective Assessment - 07/22/19 1017    Subjective  Pt reports that he did not have to get injection Monday as hemoglobin was 13.8. Has been doing well. Pt went for walk down to his pond yesterday which was uphill and downhill.    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 8/31-9/3 then inpatient rehab 9/3-9/23.PMH: CHF, HTN, hyperlipidemia, glomerulonephritis, pancytopenia/chronic anemia, seizure left-sided post stroke    Patient Stated Goals  Pt wants to be able to walk again.    Currently in Pain?  No/denies                       [facility] Adult PT Treatment/Exercise - 07/22/19 1019      Ambulation/Gait   Ambulation/Gait  Yes    Ambulation/Gait Assistance  5: Supervision    Ambulation/Gait Assistance Details  Half with SPC and half without SPC. Verbal cues to increase step length. Pt has decreased stance time left without SPC.    Ambulation Distance (Feet)  1600 Feet    Assistive device  None;Straight cane    Gait Pattern  Decreased step length - left;Decreased step length - right;Step-through pattern    Ambulation Surface  Level;Unlevel;Outdoor;Paved;Grass    Curb  5: Supervision    Curb Details (indicate cue type and reason)  with SPC up/down x 3 from grassy surface as well. Also stepped over curb blockers x 2.  Supervision/CGA for safety on grassy curb when stepping up.    Gait Comments  Pt ambulated another 12' with SPC and no AFO at end of session. Pt was able to clear left foot throughout but did have decreased DF. CGA/close SBA for safety.      Neuro Re-ed    Neuro Re-ed Details   In  // bars: gait with finger tip support on right over blue mat 6' x 6 then marching gait 6' x 4. Standing on blue mat with right foot on soccerball 30 sec x 2 without UE support. Verbal cues to not push down hard with right foot on ball. Alternating taps on soccerball with fingertip support x 10 then without UE support x 10. All activities without AFO donned.  Pt was challenged with trying to increase time on left leg with SLS activities getting off quicker without AFO.      Exercises   Exercises  Other Exercises    Other Exercises   In // bars: walking on toes 6' x 4 with bilateral UE support, attempted heel walking 6' x 2 but difficult on left. Rockerboard shifting weight ant/post x 20 without AFO donned.             PT Education - 07/22/19 1447    Education Details  Pt to continue with current HEP    Person(s) Educated  Patient    Methods  Explanation    Comprehension  Verbalized understanding       PT Short Term Goals - 06/29/19 0805      PT SHORT TERM GOAL #1   Title  Pt will ambulate >400' on varied surfaces with [facility] supervision for improved community mobility. (target date for STGs=07/02/19)    Baseline  Pt has met this goal with simulated indoor surfaces due to bad weather    Time  4    Period  Weeks    Status  Achieved    Target Date  07/02/19      PT SHORT TERM GOAL #2   Title  Pt will increase gait speed from 0.527ms to >0.834m with SPC for improved gait safety.    Baseline  06/02/2019 comfortable =0.6672mand fast=0.27m/45m0.57m/47mmfortable and 1.20m/s57mt on 06/29/19    Time  4    Period  Weeks    Status  Achieved    Target Date  07/02/19      PT SHORT TERM GOAL #3   Title  Pt will be able to perform sit to stand without UE support from standard chair consistently for improved functional strength.    Baseline  not consistent without UE support from chair. Able to perform inconsistently with hands in lap relying on momentum    Time  4    Period  Weeks    Status   On-going    Target Date  07/02/19        PT Long Term Goals - 06/29/19 1207      PT LONG TERM GOAL #1   Title  Pt will ambulate > 500' on varied surfaces with SPC versus no AD mod I for improved community  mobility.(Target date fo LTGs=08/01/19)    Baseline  supervision on level and supervision/CGA on nonlevel    Time  8    Period  Weeks    Status  Revised      PT LONG TERM GOAL #2   Title  Pt will increase Berg from 41/56 to > 48/56 for improved balance and decreased fall risk.    Baseline  45/56 on 06/02/19    Time  8    Period  Weeks    Status  On-going      PT LONG TERM GOAL #3   Title  Pt will increase DGI from 16/21 to 19/21 for improved balance and gait safety.    Baseline  16/21 on 06/02/19    Time  8    Period  Weeks    Status  New            Plan - 07/22/19 1454    Clinical Impression Statement  PT focused on decreasing AD use on varied surfaces today. Pt needed occasional CGA on nonlevel surfaces but overall progressing well. Continued work on removing AFO with gait as well. Pt is more challenged with maintaining SLS on left with AFO removed.    Personal Factors and Comorbidities  Comorbidity 3+    Comorbidities  CHF, HTN, hyperlipidemia, glomerulonephritis, pancytopenia/chronic anemia, seizure    Examination-Activity Limitations  Stairs;Stand;Squat;Locomotion Level;Transfers;Bed Mobility    Examination-Participation Restrictions  Community Activity;Shop;Driving;Cleaning;Yard Work    Merchant navy officer  Evolving/Moderate complexity    Rehab Potential  Good    PT Frequency  2x / week    PT Duration  8 weeks    PT Treatment/Interventions  DME Instruction;ADLs/Self Care Home Management;Gait training;Stair training;Functional mobility training;Therapeutic activities;Therapeutic exercise;Balance training;Orthotic Fit/Training;Patient/family education;Neuromuscular re-education;Wheelchair mobility training;Passive range of motion;Manual techniques    PT  Next Visit Plan  Continue BWS over treadmill to allow for decreased UE support. Gait overground without AD and trying some without AFO. Continued left ankle strengthening/stretching. Standing balance with more SLS activities and strengthening to left LE with more focus on glut med.    PT Home Exercise Plan  Access Code: [email or web address]    Consulted and Agree with Plan of Care  Patient    Family Member Consulted  wife       Patient will benefit from skilled therapeutic intervention in order to improve the following deficits and impairments:  Abnormal gait, Difficulty walking, Decreased balance, Decreased mobility, Decreased strength, Decreased knowledge of use of DME, Decreased endurance, Decreased activity tolerance, Decreased range of motion, Pain, Increased muscle spasms  Visit Diagnosis: Other abnormalities of gait and mobility  Muscle weakness (generalized)     Problem List Patient Active Problem List   Diagnosis Date Noted  . Essential hypertension 05/25/2019  . 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 Acute blood loss anemia   . Steroid-induced hyperglycemia   . Acute on chronic anemia   . Seizures ([place])   . Spastic hemiparesis ([place])   . 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 Hypertension   . Thrombocytopenia ([place])   . Acute unilateral cerebral infarction in a watershed distribution [facility]) 01/15/2019  . Weakness   . CVA (cerebral vascular accident) ([place]) 01/12/2019  . Malnutrition of moderate degree 07/31/2018  . Elevated serum protein level   . Multiple myeloma ([place])   . Pancytopenia ([place])   . Normocytic normochromic anemia 07/16/2018  . Acute kidney failure ([place]) 07/16/2018  . NICM (nonischemic cardiomyopathy) ([place]) 11/12/2013  . History of ETOH abuse 11/12/2013  .  Chronic combined systolic and diastolic CHF (congestive heart failure) ([place]) 11/12/2013  . Congestive dilated cardiomyopathy ([place]) 11/04/2013  . Malignant hypertension     Lina Sayre, DPT, NCS 07/22/2019, 2:56 PM  [facility] [address] [place], [place], [address] Phone: [phone]   Fax:  [phone]  Name: Jeremy Grant MRN: [id number] Date of Birth: 05-11-1961

## 2019-07-27 ENCOUNTER — Other Ambulatory Visit: Payer: Self-pay

## 2019-07-27 ENCOUNTER — Ambulatory Visit: Payer: BC Managed Care – PPO

## 2019-07-27 DIAGNOSIS — R2689 Other abnormalities of gait and mobility: Secondary | ICD-10-CM

## 2019-07-27 DIAGNOSIS — M6281 Muscle weakness (generalized): Secondary | ICD-10-CM

## 2019-07-27 NOTE — Therapy (Signed)
[facility] [address] [place], [place], [address] Phone: [phone]   Fax:  [phone]  Physical Therapy Treatment  Patient Details  Name: Jeremy Grant MRN: [id number] Date of Birth: 26-Apr-1961 Referring Provider (PT): Dr. Letta Pate   Encounter Date: 07/27/2019  PT End of Session - 07/27/19 0804    Visit Number  44    Number of Visits  45    Date for PT Re-Evaluation  08/31/19    Authorization Type  BCBS    PT Start Time  0802    PT Stop Time  0843    PT Time Calculation (min)  41 min    Equipment Utilized During Treatment  Gait belt   left AFO   Activity Tolerance  Patient tolerated treatment well    Behavior During Therapy  [facility] for tasks assessed/performed       Past Medical History:  Diagnosis Date  . Arthritis   . CHF (congestive heart failure) ([place])   . Combined systolic and diastolic cardiac dysfunction    Echo 11/03/2013 EF 02%, grade 3 diastolic dysfunction  . Hypertension   . NICM (nonischemic cardiomyopathy) ([place])    a. L/RHC (11/05/13): RA: 3, RV 52/5, PA 49/19 (31), PCWP 10, AO 166/93, PA 67%, Fick CO/CI: 5.71/2.97, Lmain: normal, LAD: large, without signficant dz, first diagonal has 20% dz at ostium, LCx: normal, RCA: 30% stenosis at the bifurcation of PDA and PLOM    Past Surgical History:  Procedure Laterality Date  . BIOPSY  01/31/2019   Procedure: BIOPSY;  Surgeon: Otis Brace, MD;  Location: [facility] ENDOSCOPY;  Service: Gastroenterology;;  . ESOPHAGOGASTRODUODENOSCOPY N/A 01/31/2019   Procedure: ESOPHAGOGASTRODUODENOSCOPY (EGD);  Surgeon: Otis Brace, MD;  Location: [facility] ENDOSCOPY;  Service: Gastroenterology;  Laterality: N/A;  . IR FLUORO GUIDE CV LINE RIGHT  07/25/2018  . IR US GUIDE VASC ACCESS RIGHT  07/25/2018  . LEFT AND RIGHT HEART CATHETERIZATION WITH CORONARY ANGIOGRAM N/A 11/05/2013   Procedure: LEFT AND RIGHT HEART CATHETERIZATION WITH CORONARY ANGIOGRAM;  Surgeon: Peter M  Martinique, MD;  Location: [facility] CATH LAB;  Service: Cardiovascular;  Laterality: N/A;  . RIGHT HEART CATHETERIZATION N/A 01/14/2014   Procedure: RIGHT HEART CATH;  Surgeon: Larey Dresser, MD;  Location: [facility] CATH LAB;  Service: Cardiovascular;  Laterality: N/A;    There were no vitals filed for this visit.  Subjective Assessment - 07/27/19 0804    Subjective  Pt reports he is doing well.    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 8/31-9/3 then inpatient rehab 9/3-9/23.PMH: CHF, HTN, hyperlipidemia, glomerulonephritis, pancytopenia/chronic anemia, seizure left-sided post stroke    Patient Stated Goals  Pt wants to be able to walk again.    Currently in Pain?  No/denies                       [facility] Adult PT Treatment/Exercise - 07/27/19 0805      Ambulation/Gait   Ambulation/Gait  Yes    Ambulation/Gait Assistance  5: Supervision    Ambulation/Gait Assistance Details  Verbal cues to increase step length.    Ambulation Distance (Feet)  460 Feet    Assistive device  None    Gait Pattern  Step-through pattern;Decreased stance time - left    Ambulation Surface  Level;Indoor      Standardized Balance Assessment   Standardized Balance Assessment  Berg Balance Test  Berg Balance Test   Sit to Stand  Able to stand without using hands and stabilize independently    Standing Unsupported  Able to stand safely 2 minutes    Sitting with Back Unsupported but Feet Supported on Floor or Stool  Able to sit safely and securely 2 minutes    Stand to Sit  Sits safely with minimal use of hands    Transfers  Able to transfer safely, minor use of hands    Standing Unsupported with Eyes Closed  Able to stand 10 seconds safely    Standing Ubsupported with Feet Together  Able to place feet together independently and stand 1 minute safely    From Standing, Reach Forward with Outstretched Arm  Can reach  confidently >25 cm (10")    From Standing Position, Pick up Object from Floor  Able to pick up shoe safely and easily    From Standing Position, Turn to Look Behind Over each Shoulder  Looks behind from both sides and weight shifts well    Turn 360 Degrees  Able to turn 360 degrees safely in 4 seconds or less    Standing Unsupported, Alternately Place Feet on Step/Stool  Able to complete 4 steps without aid or supervision    Standing Unsupported, One Foot in Front  Able to plae foot ahead of the other independently and hold 30 seconds    Standing on One Leg  Tries to lift leg/unable to hold 3 seconds but remains standing independently    Total Score  50      Neuro Re-ed    Neuro Re-ed Details   Without AFO in // bars: standing on half foam trying to maintain level x 1 min with fingertip support. Rocking heel to toe on half foam x 10. Marching gait without UE support 6' x 6 with cues to go slow and controlled to increase SLS time on left.      Exercises   Exercises  Other Exercises    Other Exercises   Step-ups on 6" step at bottom of steps x 10 with LLE without UE support with tactile cues to keep pelvis level and weight shift to left. Lateral steps-ups on 6" step LLE with 1 light UE support x 10 then keeping left foot on step and lowering down and back up x 15. Left AFO donned for all the prior exercises. Then AFO removed: left step-ups on 6" step x 10 without UE support.              PT Education - 07/27/19 1012    Education Details  Pt to continue with current HEP    Person(s) Educated  Patient    Methods  Explanation    Comprehension  Verbalized understanding       PT Short Term Goals - 06/29/19 0805      PT SHORT TERM GOAL #1   Title  Pt will ambulate >400' on varied surfaces with [facility] supervision for improved community mobility. (target date for STGs=07/02/19)    Baseline  Pt has met this goal with simulated indoor surfaces due to bad weather    Time  4    Period  Weeks     Status  Achieved    Target Date  07/02/19      PT SHORT TERM GOAL #2   Title  Pt will increase gait speed from 0.44ms to >0.842m with SPC for improved gait safety.    Baseline  06/02/2019 comfortable =0.6650mand fast=0.63m/52m  0.41ms comfortable and 1.074m fast on 06/29/19    Time  4    Period  Weeks    Status  Achieved    Target Date  07/02/19      PT SHORT TERM GOAL #3   Title  Pt will be able to perform sit to stand without UE support from standard chair consistently for improved functional strength.    Baseline  not consistent without UE support from chair. Able to perform inconsistently with hands in lap relying on momentum    Time  4    Period  Weeks    Status  On-going    Target Date  07/02/19        PT Long Term Goals - 07/27/19 0814      PT LONG TERM GOAL #1   Title  Pt will ambulate > 500' on varied surfaces with SPC versus no AD mod I for improved community mobility.(Target date fo LTGs=08/01/19)    Baseline  supervision on level and supervision/CGA on nonlevel    Time  8    Period  Weeks    Status  Revised      PT LONG TERM GOAL #2   Title  Pt will increase Berg from 41/56 to > 48/56 for improved balance and decreased fall risk.    Baseline  45/56 on 06/02/19, 07/27/19 Berg=50/56    Time  8    Period  Weeks    Status  Achieved      PT LONG TERM GOAL #3   Title  Pt will increase DGI from 16/21 to 19/21 for improved balance and gait safety.    Baseline  16/21 on 06/02/19    Time  8    Period  Weeks    Status  New            Plan - 07/27/19 1013    Clinical Impression Statement  Pt increased Berg Balance score to 50/56 meeting goal showing decreased fall risk. PT continued to focus more on left hip and ankle strengthening.    Personal Factors and Comorbidities  Comorbidity 3+    Comorbidities  CHF, HTN, hyperlipidemia, glomerulonephritis, pancytopenia/chronic anemia, seizure    Examination-Activity Limitations  Stairs;Stand;Squat;Locomotion  Level;Transfers;Bed Mobility    Examination-Participation Restrictions  Community Activity;Shop;Driving;Cleaning;Yard Work    StMerchant navy officerEvolving/Moderate complexity    Rehab Potential  Good    PT Frequency  2x / week    PT Duration  8 weeks    PT Treatment/Interventions  DME Instruction;ADLs/Self Care Home Management;Gait training;Stair training;Functional mobility training;Therapeutic activities;Therapeutic exercise;Balance training;Orthotic Fit/Training;Patient/family education;Neuromuscular re-education;Wheelchair mobility training;Passive range of motion;Manual techniques    PT Next Visit Plan  Reassess remaining LTG for recert next visit. Continue BWS over treadmill to allow for decreased UE support. Gait overground without AD and trying some without AFO. Continued left ankle strengthening/stretching. Standing balance with more SLS activities and strengthening to left LE with more focus on glut med.    PT Home Exercise Plan  Access Code: [email or web address]    Consulted and Agree with Plan of Care  Patient    Family Member Consulted  wife       Patient will benefit from skilled therapeutic intervention in order to improve the following deficits and impairments:  Abnormal gait, Difficulty walking, Decreased balance, Decreased mobility, Decreased strength, Decreased knowledge of use of DME, Decreased endurance, Decreased activity tolerance, Decreased range of motion, Pain, Increased muscle spasms  Visit Diagnosis: Other abnormalities of gait and mobility  Muscle weakness (generalized)     Problem List Patient Active Problem List   Diagnosis Date Noted  . Essential hypertension 05/25/2019  . 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 Acute blood loss anemia   . Steroid-induced hyperglycemia   . Acute on chronic anemia   . Seizures ([place])   . Spastic hemiparesis ([place])   . 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 Hypertension   . Thrombocytopenia ([place])   . Acute unilateral cerebral infarction  in a watershed distribution [facility]) 01/15/2019  . Weakness   . CVA (cerebral vascular accident) ([place]) 01/12/2019  . Malnutrition of moderate degree 07/31/2018  . Elevated serum protein level   . Multiple myeloma ([place])   . Pancytopenia ([place])   . Normocytic normochromic anemia 07/16/2018  . Acute kidney failure ([place]) 07/16/2018  . NICM (nonischemic cardiomyopathy) ([place]) 11/12/2013  . History of ETOH abuse 11/12/2013  . Chronic combined systolic and diastolic CHF (congestive heart failure) ([place]) 11/12/2013  . Congestive dilated cardiomyopathy ([place]) 11/04/2013  . Malignant hypertension     Electa Sniff, PT, DPT, NCS 07/27/2019, 10:15 AM  [facility] [address] [place], [place], [address] Phone: [phone]   Fax:  [phone]  Name: AARRON WIERZBICKI MRN: [id number] Date of Birth: 03-18-1961

## 2019-07-31 ENCOUNTER — Ambulatory Visit: Payer: BC Managed Care – PPO

## 2019-07-31 ENCOUNTER — Other Ambulatory Visit: Payer: Self-pay

## 2019-07-31 DIAGNOSIS — M6281 Muscle weakness (generalized): Secondary | ICD-10-CM

## 2019-07-31 DIAGNOSIS — R2689 Other abnormalities of gait and mobility: Secondary | ICD-10-CM

## 2019-07-31 NOTE — Therapy (Signed)
[facility] [address] [place], [place], [address] Phone: [phone]   Fax:  [phone]  Physical Therapy Treatment/Recert  Patient Details  Name: Jeremy Grant MRN: [id number] Date of Birth: 1960/07/03 Referring Provider (PT): Dr. Letta Pate   Encounter Date: 07/31/2019  PT End of Session - 07/31/19 0802    Visit Number  45    Number of Visits  61    Date for PT Re-Evaluation  16/94/50   cert 90 days but poc 60 days   Authorization Type  BCBS    PT Start Time  0758    PT Stop Time  0844    PT Time Calculation (min)  46 min    Equipment Utilized During Treatment  Gait belt   left AFO   Activity Tolerance  Patient tolerated treatment well    Behavior During Therapy  [facility] for tasks assessed/performed       Past Medical History:  Diagnosis Date  . Arthritis   . CHF (congestive heart failure) ([place])   . Combined systolic and diastolic cardiac dysfunction    Echo 11/03/2013 EF 38%, grade 3 diastolic dysfunction  . Hypertension   . NICM (nonischemic cardiomyopathy) ([place])    a. L/RHC (11/05/13): RA: 3, RV 52/5, PA 49/19 (31), PCWP 10, AO 166/93, PA 67%, Fick CO/CI: 5.71/2.97, Lmain: normal, LAD: large, without signficant dz, first diagonal has 20% dz at ostium, LCx: normal, RCA: 30% stenosis at the bifurcation of PDA and PLOM    Past Surgical History:  Procedure Laterality Date  . BIOPSY  01/31/2019   Procedure: BIOPSY;  Surgeon: Otis Brace, MD;  Location: [facility] ENDOSCOPY;  Service: Gastroenterology;;  . ESOPHAGOGASTRODUODENOSCOPY N/A 01/31/2019   Procedure: ESOPHAGOGASTRODUODENOSCOPY (EGD);  Surgeon: Otis Brace, MD;  Location: [facility] ENDOSCOPY;  Service: Gastroenterology;  Laterality: N/A;  . IR FLUORO GUIDE CV LINE RIGHT  07/25/2018  . IR US GUIDE VASC ACCESS RIGHT  07/25/2018  . LEFT AND RIGHT HEART CATHETERIZATION WITH CORONARY ANGIOGRAM N/A 11/05/2013   Procedure: LEFT AND RIGHT HEART CATHETERIZATION WITH  CORONARY ANGIOGRAM;  Surgeon: Peter M Martinique, MD;  Location: [facility] CATH LAB;  Service: Cardiovascular;  Laterality: N/A;  . RIGHT HEART CATHETERIZATION N/A 01/14/2014   Procedure: RIGHT HEART CATH;  Surgeon: Larey Dresser, MD;  Location: [facility] CATH LAB;  Service: Cardiovascular;  Laterality: N/A;    There were no vitals filed for this visit.  Subjective Assessment - 07/31/19 0801    Subjective  Pt reports that right knee was bothering him last night. Not sure if he bumped it. He does have small scratch in that area. Also had some soreness in left hip. No swelling in left foot lately.    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 8/31-9/3 then inpatient rehab 9/3-9/23.PMH: CHF, HTN, hyperlipidemia, glomerulonephritis, pancytopenia/chronic anemia, seizure left-sided post stroke    Patient Stated Goals  Pt wants to be able to walk again.    Currently in Pain?  No/denies         [facility] PT Assessment - 07/31/19 0001      Functional Gait  Assessment   Gait assessed   Yes    Gait Level Surface  Walks 20 ft in less than 7 sec but greater than 5.5 sec, uses assistive device, slower speed, mild gait deviations, or deviates 6-10 in outside of the 12 in walkway width.    Change in Gait Speed  Able  to change speed, demonstrates mild gait deviations, deviates 6-10 in outside of the 12 in walkway width, or no gait deviations, unable to achieve a major change in velocity, or uses a change in velocity, or uses an assistive device.    Gait with Horizontal Head Turns  Performs head turns smoothly with slight change in gait velocity (eg, minor disruption to smooth gait path), deviates 6-10 in outside 12 in walkway width, or uses an assistive device.    Gait with Vertical Head Turns  Performs head turns with no change in gait. Deviates no more than 6 in outside 12 in walkway width.    Gait and Pivot Turn  Pivot turns safely within 3  sec and stops quickly with no loss of balance.    Step Over Obstacle  Is able to step over one shoe box (4.5 in total height) but must slow down and adjust steps to clear box safely. May require verbal cueing.    Gait with Narrow Base of Support  Ambulates less than 4 steps heel to toe or cannot perform without assistance.    Gait with Eyes Closed  Walks 20 ft, slow speed, abnormal gait pattern, evidence for imbalance, deviates 10-15 in outside 12 in walkway width. Requires more than 9 sec to ambulate 20 ft.    Ambulating Backwards  Walks 20 ft, uses assistive device, slower speed, mild gait deviations, deviates 6-10 in outside 12 in walkway width.    Steps  Alternating feet, must use rail.    Total Score  18                   [facility] Adult PT Treatment/Exercise - 07/31/19 0803      Transfers   Transfers  Sit to Stand;Stand to Sit    Sit to Stand  6: Modified independent (Device/Increase time)    Stand to Sit  6: Modified independent (Device/Increase time)      Ambulation/Gait   Ambulation/Gait  Yes    Ambulation/Gait Assistance Details  on treadmill-see below    Gait Comments  BWS over treadmill 7 min x 2 at 1.63mh with standig rest break in between. During both bouts pt ambulated 1 min with bilateral UE support, 1 min with 1 UE support and then 5 min without UE support. Verbal cues to relax arms for more arm swing and to increase step length at times but did not provide tactile assist this session. BWS only utilized for safety with no unweighting. BP=152/78 after      Standardized Balance Assessment   Standardized Balance Assessment  Dynamic Gait Index      Dynamic Gait Index   Level Surface  Mild Impairment    Change in Gait Speed  Normal    Gait with Horizontal Head Turns  Normal    Gait with Vertical Head Turns  Normal    Gait and Pivot Turn  Normal    Step Over Obstacle  Mild Impairment    Step Around Obstacles  Normal    Steps  Mild Impairment    Total Score  21              PT Education - 07/31/19 1023    Education Details  PT plan to recert    Person(s) Educated  Patient;Spouse    Methods  Explanation    Comprehension  Verbalized understanding       PT Short Term Goals - 06/29/19 0805      PT SHORT TERM GOAL #  1   Title  Pt will ambulate >400' on varied surfaces with [facility] supervision for improved community mobility. (target date for STGs=07/02/19)    Baseline  Pt has met this goal with simulated indoor surfaces due to bad weather    Time  4    Period  Weeks    Status  Achieved    Target Date  07/02/19      PT SHORT TERM GOAL #2   Title  Pt will increase gait speed from 0.24ms to >0.85m with SPC for improved gait safety.    Baseline  06/02/2019 comfortable =0.6645mand fast=0.81m/37m0.19m/76mmfortable and 1.63m/s481mt on 06/29/19    Time  4    Period  Weeks    Status  Achieved    Target Date  07/02/19      PT SHORT TERM GOAL #3   Title  Pt will be able to perform sit to stand without UE support from standard chair consistently for improved functional strength.    Baseline  not consistent without UE support from chair. Able to perform inconsistently with hands in lap relying on momentum    Time  4    Period  Weeks    Status  On-going    Target Date  07/02/19        PT Long Term Goals - 07/31/19 1032      PT LONG TERM GOAL #1   Title  Pt will ambulate > 500' on varied surfaces with SPC versus no AD mod I for improved community mobility.(Target date fo LTGs=08/01/19)    Baseline  supervision on level and supervision/CGA on nonlevel with SPC. Have also initiated gait without AD. Has met distance.    Time  8    Period  Weeks    Status  Partially Met      PT LONG TERM GOAL #2   Title  Pt will increase Berg from 41/56 to > 48/56 for improved balance and decreased fall risk.    Baseline  45/56 on 06/02/19, 07/27/19 Berg=50/56    Time  8    Period  Weeks    Status  Achieved      PT LONG TERM GOAL #3   Title  Pt will increase DGI  from 16/21 to 19/21 for improved balance and gait safety.    Baseline  16/21 on 06/02/19, 07/31/19 DGI=21/24    Time  8    Period  Weeks    Status  Achieved        Updated PT goals: PT Short Term Goals - 07/31/19 1040      PT SHORT TERM GOAL #1   Title  Pt will be independent with progressive HEP for strengthening, balance and gait.    Time  4    Period  Weeks    Status  New    Target Date  08/30/19      PT SHORT TERM GOAL #2   Title  Pt will ambulate >500' on varied surfaces with SPC mod I for improved community mobility.    Time  4    Period  Weeks    Status  New    Target Date  08/30/19      PT SHORT TERM GOAL #3   Title  Pt will be able to perform sit to stand without UE support from standard chair consistently for improved functional strength.    Baseline  not consistent without UE support from chair. Able to perform inconsistently with hands  in lap relying on momentum    Time  4    Period  Weeks    Status  On-going      PT Long Term Goals - 07/31/19 1042      PT LONG TERM GOAL #1   Title  Pt will ambulate >1000' without AD  on varied surfaces mod I for improved community mobility.    Time  8    Period  Weeks    Status  New    Target Date  09/29/19      PT LONG TERM GOAL #2   Title  Pt will increase FGA from 18/30 to >22/30    Baseline  18/30 on 07/31/19    Time  8    Period  Weeks    Status  New    Target Date  09/29/19      PT LONG TERM GOAL #3   Title  Pt will ambulate up/down 4 steps in reciprocal pattern without railing independently for improved functional strength and balance.    Time  8    Period  Weeks    Status  New    Target Date  09/29/19      PT LONG TERM GOAL #4   Title  Pt will increase gait speed to 1.0 m/s or higher for safe community ambulator speed without AD.    Baseline  without AD=0.37ms    Time  8    Period  Weeks    Status  New    Target Date  09/29/19          Plan - 07/31/19 1034    Clinical Impression Statement  Pt  continues to show progress towards all goals. He met BMerrilee Janskygoal last session with 50/56 indicating lower fall risk.Met DGI goal today with 21/24 indicating low fall risk. PT assessed FGA today with higher level gait activities without AD and scored 18/30 which does indicate higher fall risk. New goal written to add this in to work more towards gait without SPC. Pt is currently ambulating with SPC and left AFO. Weakness still noted most notable in left hip and ankle. Pt will benefit from continued PT to work towards decreasing AD need with gait on varied surfaces, strengthening and balance to maximize independence in community.    Personal Factors and Comorbidities  Comorbidity 3+    Comorbidities  CHF, HTN, hyperlipidemia, glomerulonephritis, pancytopenia/chronic anemia, seizure    Examination-Activity Limitations  Stairs;Stand;Squat;Locomotion Level;Transfers;Bed Mobility    Examination-Participation Restrictions  Community Activity;Shop;Driving;Cleaning;Yard Work    SMerchant navy officer Evolving/Moderate complexity    Rehab Potential  Good    PT Frequency  2x / week    PT Duration  8 weeks    PT Treatment/Interventions  DME Instruction;ADLs/Self Care Home Management;Gait training;Stair training;Functional mobility training;Therapeutic activities;Therapeutic exercise;Balance training;Orthotic Fit/Training;Patient/family education;Neuromuscular re-education;Wheelchair mobility training;Passive range of motion;Manual techniques    PT Next Visit Plan  Continue BWS over treadmill to allow for decreased UE support. Gait overground without AD and trying some without AFO. Add in more dual task activities as well. Continued left ankle strengthening/stretching. Standing balance with more SLS activities and strengthening to left LE with more focus on glut med.    PT Home Exercise Plan  Access Code: [email or web address]    Consulted and Agree with Plan of Care  Patient    Family Member Consulted  wife        Patient will benefit from skilled therapeutic intervention in order to improve the following  deficits and impairments:  Abnormal gait, Difficulty walking, Decreased balance, Decreased mobility, Decreased strength, Decreased knowledge of use of DME, Decreased endurance, Decreased activity tolerance, Decreased range of motion, Pain, Increased muscle spasms  Visit Diagnosis: Other abnormalities of gait and mobility  Muscle weakness (generalized)     Problem List Patient Active Problem List   Diagnosis Date Noted  . Essential hypertension 05/25/2019  . 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 Acute blood loss anemia   . Steroid-induced hyperglycemia   . Acute on chronic anemia   . Seizures ([place])   . Spastic hemiparesis ([place])   . 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 Hypertension   . Thrombocytopenia ([place])   . Acute unilateral cerebral infarction in a watershed distribution [facility]) 01/15/2019  . Weakness   . CVA (cerebral vascular accident) ([place]) 01/12/2019  . Malnutrition of moderate degree 07/31/2018  . Elevated serum protein level   . Multiple myeloma ([place])   . Pancytopenia ([place])   . Normocytic normochromic anemia 07/16/2018  . Acute kidney failure ([place]) 07/16/2018  . NICM (nonischemic cardiomyopathy) ([place]) 11/12/2013  . History of ETOH abuse 11/12/2013  . Chronic combined systolic and diastolic CHF (congestive heart failure) ([place]) 11/12/2013  . Congestive dilated cardiomyopathy ([place]) 11/04/2013  . Malignant hypertension     Electa Sniff, PT, DPT, NCS 07/31/2019, 10:39 AM  [facility] [address] [place], [place], [address] Phone: [phone]   Fax:  [phone]  Name: Jeremy Grant MRN: [id number] Date of Birth: Sep 20, 1960

## 2019-08-03 ENCOUNTER — Other Ambulatory Visit: Payer: Self-pay

## 2019-08-03 ENCOUNTER — Encounter (HOSPITAL_COMMUNITY)
Admission: RE | Admit: 2019-08-03 | Discharge: 2019-08-03 | Disposition: A | Discharge status: Home / Self Care | Payer: BC Managed Care – PPO | Source: Ambulatory Visit | Attending: Nephrology | Admitting: Nephrology

## 2019-08-03 ENCOUNTER — Ambulatory Visit: Payer: BC Managed Care – PPO

## 2019-08-03 VITALS — BP 149/87 | HR 63 | Temp 98.4°F | Resp 18

## 2019-08-03 DIAGNOSIS — R2689 Other abnormalities of gait and mobility: Secondary | ICD-10-CM

## 2019-08-03 DIAGNOSIS — N179 Acute kidney failure, unspecified: Secondary | ICD-10-CM

## 2019-08-03 DIAGNOSIS — M6281 Muscle weakness (generalized): Secondary | ICD-10-CM

## 2019-08-03 LAB — POCT HEMOGLOBIN-HEMACUE: Hemoglobin: 12.5 g/dL — ABNORMAL LOW (ref 13.0–17.0)

## 2019-08-03 LAB — IRON AND TIBC
Iron: 94 ug/dL (ref 45–182)
Saturation Ratios: 36 % (ref 17.9–39.5)
TIBC: 259 ug/dL (ref 250–450)
UIBC: 165 ug/dL

## 2019-08-03 LAB — FERRITIN: Ferritin: 756 ng/mL — ABNORMAL HIGH (ref 24–336)

## 2019-08-03 MED ORDER — EPOETIN ALFA-EPBX 10000 UNIT/ML IJ SOLN: 20000.0000 [IU] | INTRAMUSCULAR | Status: DC

## 2019-08-03 NOTE — Therapy (Signed)
[facility] [address] [place], [place], [address] Phone: [phone]   Fax:  [phone]  Physical Therapy Treatment  Patient Details  Name: Jeremy Grant MRN: [id number] Date of Birth: January 21, 1961 Referring Provider (PT): Dr. Letta Pate   Encounter Date: 08/03/2019  PT End of Session - 08/03/19 0759    Visit Number  54    Number of Visits  61    Date for PT Re-Evaluation  02/40/97   cert 90 days but poc 60 days   Authorization Type  BCBS    PT Start Time  0757    PT Stop Time  0844    PT Time Calculation (min)  47 min    Equipment Utilized During Treatment  Gait belt   left AFO   Activity Tolerance  Patient tolerated treatment well    Behavior During Therapy  [facility] for tasks assessed/performed       Past Medical History:  Diagnosis Date  . Arthritis   . CHF (congestive heart failure) ([place])   . Combined systolic and diastolic cardiac dysfunction    Echo 11/03/2013 EF 35%, grade 3 diastolic dysfunction  . Hypertension   . NICM (nonischemic cardiomyopathy) ([place])    a. L/RHC (11/05/13): RA: 3, RV 52/5, PA 49/19 (31), PCWP 10, AO 166/93, PA 67%, Fick CO/CI: 5.71/2.97, Lmain: normal, LAD: large, without signficant dz, first diagonal has 20% dz at ostium, LCx: normal, RCA: 30% stenosis at the bifurcation of PDA and PLOM    Past Surgical History:  Procedure Laterality Date  . BIOPSY  01/31/2019   Procedure: BIOPSY;  Surgeon: Otis Brace, MD;  Location: [place] ENDOSCOPY;  Service: Gastroenterology;;  . ESOPHAGOGASTRODUODENOSCOPY N/A 01/31/2019   Procedure: ESOPHAGOGASTRODUODENOSCOPY (EGD);  Surgeon: Otis Brace, MD;  Location: [facility] ENDOSCOPY;  Service: Gastroenterology;  Laterality: N/A;  . IR FLUORO GUIDE CV LINE RIGHT  07/25/2018  . IR US GUIDE VASC ACCESS RIGHT  07/25/2018  . LEFT AND RIGHT HEART CATHETERIZATION WITH CORONARY ANGIOGRAM N/A 11/05/2013   Procedure: LEFT AND RIGHT HEART CATHETERIZATION WITH  CORONARY ANGIOGRAM;  Surgeon: Peter M Martinique, MD;  Location: [facility] CATH LAB;  Service: Cardiovascular;  Laterality: N/A;  . RIGHT HEART CATHETERIZATION N/A 01/14/2014   Procedure: RIGHT HEART CATH;  Surgeon: Larey Dresser, MD;  Location: [facility] CATH LAB;  Service: Cardiovascular;  Laterality: N/A;    There were no vitals filed for this visit.  Subjective Assessment - 08/03/19 0759    Subjective  Pt reports he is doing ok. Drove himself today and has to get hemoglobin checked.    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 8/31-9/3 then inpatient rehab 9/3-9/23.PMH: CHF, HTN, hyperlipidemia, glomerulonephritis, pancytopenia/chronic anemia, seizure left-sided post stroke    Patient Stated Goals  Pt wants to be able to walk again.    Currently in Pain?  No/denies                       [facility] Adult PT Treatment/Exercise - 08/03/19 0800      Ambulation/Gait   Ambulation/Gait  Yes    Ambulation/Gait Assistance  5: Supervision    Ambulation/Gait Assistance Details  Pt was given verbal cues to increase step length.    Ambulation Distance (Feet)  460 Feet    Assistive device  None   left AFO     Neuro Re-ed    Neuro Re-ed Details   Resisted gait  with black resistance belt 345' to try to facilitate more propulsion. In // bars side stepping with resistance belt x 4 laps to each side. More diffculty with coming back towards left side. Step-ups on 6" step with resistance belt posterior with 1 UE support. Standing with 1 foot on 6" step with pertubations with resistance belt x 30 sec each position. Resisted posterior gait in // bars with resistance belt with verbal cues to try to increase step length 6' x 6. Stepping with left leg forward with hip flexion and back with resistance belt posterior.  Gait without AFO with posterior resistance 460'. Standing on rockerboard without AFO trying to maintain level x 30 sec  then with manual pertubations x 30 sec. On rockerboard positioned ant/pot alternating taps on cone with finger tip support x 10 then x 10 with only right UE support. Standing with 1 left foot on rockerboard and other foot on cone x 30 sec with right fingertip support then repeated on other side without support. Pt reported a little pain in left hip with SLS. Cued to tighten bottom to help support more.             PT Education - 08/03/19 1814    Education Details  Pt to continue with current HEP    Person(s) Educated  Patient    Methods  Explanation    Comprehension  Verbalized understanding       PT Short Term Goals - 07/31/19 1040      PT SHORT TERM GOAL #1   Title  Pt will be independent with progressive HEP for strengthening, balance and gait.    Time  4    Period  Weeks    Status  New    Target Date  08/30/19      PT SHORT TERM GOAL #2   Title  Pt will ambulate >500' on varied surfaces with SPC mod I for improved community mobility.    Time  4    Period  Weeks    Status  New    Target Date  08/30/19      PT SHORT TERM GOAL #3   Title  Pt will be able to perform sit to stand without UE support from standard chair consistently for improved functional strength.    Baseline  not consistent without UE support from chair. Able to perform inconsistently with hands in lap relying on momentum    Time  4    Period  Weeks    Status  On-going        PT Long Term Goals - 07/31/19 1042      PT LONG TERM GOAL #1   Title  Pt will ambulate >1000' without AD  on varied surfaces mod I for improved community mobility.    Time  8    Period  Weeks    Status  New    Target Date  09/29/19      PT LONG TERM GOAL #2   Title  Pt will increase FGA from 18/30 to >22/30    Baseline  18/30 on 07/31/19    Time  8    Period  Weeks    Status  New    Target Date  09/29/19      PT LONG TERM GOAL #3   Title  Pt will ambulate up/down 4 steps in reciprocal pattern without railing  independently for improved functional strength and balance.    Time  8    Period  Weeks    Status  New    Target Date  09/29/19      PT LONG TERM GOAL #4   Title  Pt will increase gait speed to 1.0 m/s or higher for safe community ambulator speed without AD.    Baseline  without AD=0.71ms    Time  8    Period  Weeks    Status  New    Target Date  09/29/19            Plan - 08/03/19 1815    Clinical Impression Statement  PT focused on resisted gait today trying to improve propulsion on left leg. Pt tolerated well and was able to perform with supervision but did need some UE support with step-up activity.    Personal Factors and Comorbidities  Comorbidity 3+    Comorbidities  CHF, HTN, hyperlipidemia, glomerulonephritis, pancytopenia/chronic anemia, seizure    Examination-Activity Limitations  Stairs;Stand;Squat;Locomotion Level;Transfers;Bed Mobility    Examination-Participation Restrictions  Community Activity;Shop;Driving;Cleaning;Yard Work    SMerchant navy officer Evolving/Moderate complexity    Rehab Potential  Good    PT Frequency  2x / week    PT Duration  8 weeks    PT Treatment/Interventions  DME Instruction;ADLs/Self Care Home Management;Gait training;Stair training;Functional mobility training;Therapeutic activities;Therapeutic exercise;Balance training;Orthotic Fit/Training;Patient/family education;Neuromuscular re-education;Wheelchair mobility training;Passive range of motion;Manual techniques    PT Next Visit Plan  Continue BWS over treadmill to allow for decreased UE support. Gait overground without AD and trying some without AFO. Resisted gait. Add in more dual task activities as well. Continued left ankle strengthening/stretching. Standing balance with more SLS activities and strengthening to left LE with more focus on glut med.    PT Home Exercise Plan  Access Code: [email or web address]    Consulted and Agree with Plan of Care  Patient    Family Member Consulted   wife       Patient will benefit from skilled therapeutic intervention in order to improve the following deficits and impairments:  Abnormal gait, Difficulty walking, Decreased balance, Decreased mobility, Decreased strength, Decreased knowledge of use of DME, Decreased endurance, Decreased activity tolerance, Decreased range of motion, Pain, Increased muscle spasms  Visit Diagnosis: Other abnormalities of gait and mobility  Muscle weakness (generalized)     Problem List Patient Active Problem List   Diagnosis Date Noted  . Essential hypertension 05/25/2019  . 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 Acute blood loss anemia   . Steroid-induced hyperglycemia   . Acute on chronic anemia   . Seizures ([place]   . Spastic hemiparesis ([place]   . ANCA-associated vasculitis ([place]   . Hypertension   . Thrombocytopenia ([place]   . Acute unilateral cerebral infarction in a watershed distribution ([facility] 01/15/2019  . Weakness   . CVA (cerebral vascular accident) ([place] 01/12/2019  . Malnutrition of moderate degree 07/31/2018  . Elevated serum protein level   . Multiple myeloma ([place]   . Pancytopenia ([place]   . Normocytic normochromic anemia 07/16/2018  . Acute kidney failure ([place] 07/16/2018  . NICM (nonischemic cardiomyopathy) ([place] 11/12/2013  . History of ETOH abuse 11/12/2013  . Chronic combined systolic and diastolic CHF (congestive heart failure) ([place] 11/12/2013  . Congestive dilated cardiomyopathy ([place] 11/04/2013  . Malignant hypertension     EElecta Sniff PT, DPT, NCS 08/03/2019, 6:17 PM  [address] [place] [address]Phone: [phone]  Fax:  [phone] Name: Jeremy CISLOMRN: [id number] of Birth:  20-Apr-1961

## 2019-08-07 ENCOUNTER — Ambulatory Visit: Payer: BC Managed Care – PPO

## 2019-08-07 ENCOUNTER — Other Ambulatory Visit: Payer: Self-pay

## 2019-08-07 ENCOUNTER — Telehealth: Payer: Self-pay

## 2019-08-07 DIAGNOSIS — R2689 Other abnormalities of gait and mobility: Secondary | ICD-10-CM

## 2019-08-07 DIAGNOSIS — M6281 Muscle weakness (generalized): Secondary | ICD-10-CM

## 2019-08-07 NOTE — Telephone Encounter (Signed)
Note created in error.

## 2019-08-07 NOTE — Therapy (Signed)
[place] [facility] [address] [place], [place], [address] Phone: [phone]   Fax:  [phone]  Physical Therapy Treatment  Patient Details  Name: Jeremy Grant MRN: [id number] Date of Birth: November 24, 1960 Referring Provider (PT): Dr. Letta Pate   Encounter Date: 08/07/2019  PT End of Session - 08/07/19 0801    Visit Number  59    Number of Visits  61    Date for PT Re-Evaluation  53/61/44   cert 90 days but poc 60 days   Authorization Type  BCBS    PT Start Time  0800    PT Stop Time  0843    PT Time Calculation (min)  43 min    Equipment Utilized During Treatment  Gait belt   left AFO   Activity Tolerance  Patient tolerated treatment well    Behavior During Therapy  [facility] for tasks assessed/performed       Past Medical History:  Diagnosis Date  . Arthritis   . CHF (congestive heart failure) ([place])   . Combined systolic and diastolic cardiac dysfunction    Echo 11/03/2013 EF 31%, grade 3 diastolic dysfunction  . Hypertension   . NICM (nonischemic cardiomyopathy) ([place])    a. L/RHC (11/05/13): RA: 3, RV 52/5, PA 49/19 (31), PCWP 10, AO 166/93, PA 67%, Fick CO/CI: 5.71/2.97, Lmain: normal, LAD: large, without signficant dz, first diagonal has 20% dz at ostium, LCx: normal, RCA: 30% stenosis at the bifurcation of PDA and PLOM    Past Surgical History:  Procedure Laterality Date  . BIOPSY  01/31/2019   Procedure: BIOPSY;  Surgeon: Otis Brace, MD;  Location: [facility] ENDOSCOPY;  Service: Gastroenterology;;  . ESOPHAGOGASTRODUODENOSCOPY N/A 01/31/2019   Procedure: ESOPHAGOGASTRODUODENOSCOPY (EGD);  Surgeon: Otis Brace, MD;  Location[facility] ENDOSCOPY;  Service: Gastroenterology;  Laterality: N/A;  . IR FLUORO GUIDE CV LINE RIGHT  07/25/2018  . IR US GUIDE VASC ACCESS RIGHT  07/25/2018  . LEFT AND RIGHT HEART CATHETERIZATION WITH CORONARY ANGIOGRAM N/A 11/05/2013   Procedure: LEFT AND RIGHT HEART CATHETERIZATION WITH  CORONARY ANGIOGRAM;  Surgeon: Peter M Martinique, MD;  Location: [facility] CATH LAB;  Service: Cardiovascular;  Laterality: N/A;  . RIGHT HEART CATHETERIZATION N/A 01/14/2014   Procedure: RIGHT HEART CATH;  Surgeon: Larey Dresser, MD;  Location: [facility] CATH LAB;  Service: Cardiovascular;  Laterality: N/A;    There were no vitals filed for this visit.  Subjective Assessment - 08/07/19 0800    Subjective  Pt reports that he is doing well. He did not have to get an injection the other day and saw kidney doctor yesterday and was told he was in remission. Does not have to return for 2 months.    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 8/31-9/3 then inpatient rehab 9/3-9/23.PMH: CHF, HTN, hyperlipidemia, glomerulonephritis, pancytopenia/chronic anemia, seizure left-sided post stroke    Patient Stated Goals  Pt wants to be able to walk again.    Currently in Pain?  No/denies                       [facility] Adult PT Treatment/Exercise - 08/07/19 0801      Ambulation/Gait   Ambulation/Gait  Yes    Ambulation/Gait Assistance  5: Supervision    Ambulation/Gait Assistance Details  Pt was given verbal cues to increase step length and tighten gluts on left with stance time as well as more heel  strike.    Ambulation Distance (Feet)  345 Feet    Assistive device  None   left AFO   Gait Comments  Pt ambulated in BWS over treadmill x 16 min at 1.4pmh with 1 standing rest break at 8 min. Verbal cues to incresae step length and heel strike. Pt had decreased left stance time as fatigued towards end of 1st bout but did better with 2nd bout.  BP=148/88 after both bouts. Resisted gait 230' without AD with just left AFO staying close with 1 episode of staggering to right and then in // bars forward and back 6' x 6 with increased resistance and side stepping 6' x 3 each side with resistance             PT Education -  08/07/19 0853    Education Details  Pt to continue with current HEP    Person(s) Educated  Patient    Methods  Explanation    Comprehension  Verbalized understanding       PT Short Term Goals - 07/31/19 1040      PT SHORT TERM GOAL #1   Title  Pt will be independent with progressive HEP for strengthening, balance and gait.    Time  4    Period  Weeks    Status  New    Target Date  08/30/19      PT SHORT TERM GOAL #2   Title  Pt will ambulate >500' on varied surfaces with SPC mod I for improved community mobility.    Time  4    Period  Weeks    Status  New    Target Date  08/30/19      PT SHORT TERM GOAL #3   Title  Pt will be able to perform sit to stand without UE support from standard chair consistently for improved functional strength.    Baseline  not consistent without UE support from chair. Able to perform inconsistently with hands in lap relying on momentum    Time  4    Period  Weeks    Status  On-going        PT Long Term Goals - 07/31/19 1042      PT LONG TERM GOAL #1   Title  Pt will ambulate >1000' without AD  on varied surfaces mod I for improved community mobility.    Time  8    Period  Weeks    Status  New    Target Date  09/29/19      PT LONG TERM GOAL #2   Title  Pt will increase FGA from 18/30 to >22/30    Baseline  18/30 on 07/31/19    Time  8    Period  Weeks    Status  New    Target Date  09/29/19      PT LONG TERM GOAL #3   Title  Pt will ambulate up/down 4 steps in reciprocal pattern without railing independently for improved functional strength and balance.    Time  8    Period  Weeks    Status  New    Target Date  09/29/19      PT LONG TERM GOAL #4   Title  Pt will increase gait speed to 1.0 m/s or higher for safe community ambulator speed without AD.    Baseline  without AD=0.26ms    Time  8    Period  Weeks    Status  New  Target Date  09/29/19            Plan - 08/07/19 0853    Clinical Impression Statement  Pt  was able to increase time on treadmill in BWS today with less fatigue. BP doing well. Pt is demonstrating more push off with resisted gait.    Personal Factors and Comorbidities  Comorbidity 3+    Comorbidities  CHF, HTN, hyperlipidemia, glomerulonephritis, pancytopenia/chronic anemia, seizure    Examination-Activity Limitations  Stairs;Stand;Squat;Locomotion Level;Transfers;Bed Mobility    Examination-Participation Restrictions  Community Activity;Shop;Driving;Cleaning;Yard Work    [employer] officer  Evolving/Moderate complexity    Rehab Potential  Good    PT Frequency  2x / week    PT Duration  8 weeks    PT Treatment/Interventions  DME Instruction;ADLs/Self Care Home Management;Gait training;Stair training;Functional mobility training;Therapeutic activities;Therapeutic exercise;Balance training;Orthotic Fit/Training;Patient/family education;Neuromuscular re-education;Wheelchair mobility training;Passive range of motion;Manual techniques    PT Next Visit Plan  Continue BWS over treadmill to allow for decreased UE support. Gait overground without AD and trying some without AFO. Resisted gait. Add in more dual task activities as well. Continued left ankle strengthening/stretching. Standing balance with more SLS activities and strengthening to left LE with more focus on glut med.    PT Home Exercise Plan  Access Code: [email or web address]    Consulted and Agree with Plan of Care  Patient    Family Member Consulted  wife       Patient will benefit from skilled therapeutic intervention in order to improve the following deficits and impairments:  Abnormal gait, Difficulty walking, Decreased balance, Decreased mobility, Decreased strength, Decreased knowledge of use of DME, Decreased endurance, Decreased activity tolerance, Decreased range of motion, Pain, Increased muscle spasms  Visit Diagnosis: Other abnormalities of gait and mobility  Muscle weakness (generalized)     Problem  List Patient Active Problem List   Diagnosis Date Noted  . Essential hypertension 05/25/2019  . 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 Acute blood loss anemia   . Steroid-induced hyperglycemia   . Acute on chronic anemia   . Seizures ([place])   . Spastic hemiparesis ([place])   . 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 Hypertension   . Thrombocytopenia ([place])   . Acute unilateral cerebral infarction in a watershed distribution [facility]) 01/15/2019  . Weakness   . CVA (cerebral vascular accident) ([place]) 01/12/2019  . Malnutrition of moderate degree 07/31/2018  . Elevated serum protein level   . Multiple myeloma ([place])   . Pancytopenia ([place])   . Normocytic normochromic anemia 07/16/2018  . Acute kidney failure ([place]) 07/16/2018  . NICM (nonischemic cardiomyopathy) ([place]) 11/12/2013  . History of ETOH abuse 11/12/2013  . Chronic combined systolic and diastolic CHF (congestive heart failure) ([place]) 11/12/2013  . Congestive dilated cardiomyopathy ([place]) 11/04/2013  . Malignant hypertension     Electa Sniff, PT, DPT, NCS 08/07/2019, 8:55 AM  [facility] [address] [place], [place], [address] Phone: [phone]   Fax:  [phone]  Name: Jeremy Grant MRN: [id number] Date of Birth: 09-Dec-1960

## 2019-08-10 ENCOUNTER — Other Ambulatory Visit: Payer: Self-pay

## 2019-08-10 ENCOUNTER — Ambulatory Visit: Payer: BC Managed Care – PPO

## 2019-08-10 DIAGNOSIS — M6281 Muscle weakness (generalized): Secondary | ICD-10-CM

## 2019-08-10 DIAGNOSIS — R2689 Other abnormalities of gait and mobility: Secondary | ICD-10-CM

## 2019-08-10 NOTE — Therapy (Signed)
[facility] [address] [place], [place], [address] Phone: [phone]   Fax:  [phone]  Physical Therapy Treatment  Patient Details  Name: Jeremy Grant MRN: [id number] Date of Birth: Aug 11, 1960 Referring Provider (PT): Dr. Letta Pate   Encounter Date: 08/10/2019  PT End of Session - 08/10/19 0804    Visit Number  48    Number of Visits  61    Date for PT Re-Evaluation  65/46/50   cert 90 days but poc 60 days   Authorization Type  BCBS    PT Start Time  0801    PT Stop Time  0844    PT Time Calculation (min)  43 min    Equipment Utilized During Treatment  Gait belt   left AFO   Activity Tolerance  Patient tolerated treatment well    Behavior During Therapy  [facility] for tasks assessed/performed       Past Medical History:  Diagnosis Date  . Arthritis   . CHF (congestive heart failure) ([place])   . Combined systolic and diastolic cardiac dysfunction    Echo 11/03/2013 EF 35%, grade 3 diastolic dysfunction  . Hypertension   . NICM (nonischemic cardiomyopathy) ([place])    a. L/RHC (11/05/13): RA: 3, RV 52/5, PA 49/19 (31), PCWP 10, AO 166/93, PA 67%, Fick CO/CI: 5.71/2.97, Lmain: normal, LAD: large, without signficant dz, first diagonal has 20% dz at ostium, LCx: normal, RCA: 30% stenosis at the bifurcation of PDA and PLOM    Past Surgical History:  Procedure Laterality Date  . BIOPSY  01/31/2019   Procedure: BIOPSY;  Surgeon: Otis Brace, MD;  Location: [facility] ENDOSCOPY;  Service: Gastroenterology;;  . ESOPHAGOGASTRODUODENOSCOPY N/A 01/31/2019   Procedure: ESOPHAGOGASTRODUODENOSCOPY (EGD);  Surgeon: Otis Brace, MD;  Location: [facility] ENDOSCOPY;  Service: Gastroenterology;  Laterality: N/A;  . IR FLUORO GUIDE CV LINE RIGHT  07/25/2018  . IR US GUIDE VASC ACCESS RIGHT  07/25/2018  . LEFT AND RIGHT HEART CATHETERIZATION WITH CORONARY ANGIOGRAM N/A 11/05/2013   Procedure: LEFT AND RIGHT HEART CATHETERIZATION WITH  CORONARY ANGIOGRAM;  Surgeon: Peter M Martinique, MD;  Location: [facility] CATH LAB;  Service: Cardiovascular;  Laterality: N/A;  . RIGHT HEART CATHETERIZATION N/A 01/14/2014   Procedure: RIGHT HEART CATH;  Surgeon: Larey Dresser, MD;  Location: [facility] CATH LAB;  Service: Cardiovascular;  Laterality: N/A;    There were no vitals filed for this visit.  Subjective Assessment - 08/10/19 0803    Subjective  Pt reports that he is doing well. Was a little sore after last session.    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 8/31-9/3 then inpatient rehab 9/3-9/23.PMH: CHF, HTN, hyperlipidemia, glomerulonephritis, pancytopenia/chronic anemia, seizure left-sided post stroke    Patient Stated Goals  Pt wants to be able to walk again.    Currently in Pain?  No/denies                       [facility] Adult PT Treatment/Exercise - 08/10/19 0804      Transfers   Transfers  Sit to Stand;Stand to Sit    Sit to Stand  6: Modified independent (Device/Increase time)    Stand to Sit  6: Modified independent (Device/Increase time)      Ambulation/Gait   Ambulation/Gait  Yes    Ambulation/Gait Assistance  5: Supervision    Ambulation/Gait Assistance Details  Pt was given verbal cues to increase  step length and tighten gluts on left during stance.    Ambulation Distance (Feet)  230 Feet    Assistive device  None   left AFO   Gait Pattern  Step-through pattern;Decreased stance time - left    Gait Comments  Pt ambulated 230' without AFO donned supervision with verbal cues to increase left heel strike and step length. Pt has more foot flat at initial strike on left.      Neuro Re-ed    Neuro Re-ed Details   Pt performed dynamic gait activities around track: 115' with marching gait with verbal cues to take his time to increase SLS time, 6' with large steps, 21' with speed changes on command, 74' with stop/go on command.  Close SBA/CGA for safety. Stepping over 4 large hurdles with reciprocal steps CGA x 6 laps with occasional catching left foot when trailing. Then switched to side stepping over 4 hurdles x 3 laps CGA. Pt reported more difficulty with going to the right. Walking over floor ladder with reciprocal steps as fast as he could x 3 laps CGA. Diagonal steps through floor ladder x 2 laps, side stepping over floor ladder x 2 laps. CGA and braced donned with all prior activities. With left AFO removed: walking over blue mat in // bars without UE support marching 6' x 4, tandem gait over blue mat 6' x 4 with 1 light UE support. Standing on rockerboard maintaining level x 30 sec without UE support, alternating taps on rolled blue mat in front from rockerboard positioned ant/post 10 x 2 bilateral without UE support. CGA with decreased stance time on LLE. Verbal cues to try to move slower with more control. Standing on 2 dynadiscs without UE support x 30 sec then with  playing catch with 3.3# medicine ball x 2 min with PT guarding and another PT tossing ball             PT Education - 08/10/19 0908    Education Details  Pt to continue with current HEP    Person(s) Educated  Patient    Methods  Explanation    Comprehension  Verbalized understanding       PT Short Term Goals - 07/31/19 1040      PT SHORT TERM GOAL #1   Title  Pt will be independent with progressive HEP for strengthening, balance and gait.    Time  4    Period  Weeks    Status  New    Target Date  08/30/19      PT SHORT TERM GOAL #2   Title  Pt will ambulate >500' on varied surfaces with SPC mod I for improved community mobility.    Time  4    Period  Weeks    Status  New    Target Date  08/30/19      PT SHORT TERM GOAL #3   Title  Pt will be able to perform sit to stand without UE support from standard chair consistently for improved functional strength.    Baseline  not consistent without UE support from chair. Able to perform  inconsistently with hands in lap relying on momentum    Time  4    Period  Weeks    Status  On-going        PT Long Term Goals - 07/31/19 1042      PT LONG TERM GOAL #1   Title  Pt will ambulate >1000' without AD  on varied surfaces  mod I for improved community mobility.    Time  8    Period  Weeks    Status  New    Target Date  09/29/19      PT LONG TERM GOAL #2   Title  Pt will increase FGA from 18/30 to >22/30    Baseline  18/30 on 07/31/19    Time  8    Period  Weeks    Status  New    Target Date  09/29/19      PT LONG TERM GOAL #3   Title  Pt will ambulate up/down 4 steps in reciprocal pattern without railing independently for improved functional strength and balance.    Time  8    Period  Weeks    Status  New    Target Date  09/29/19      PT LONG TERM GOAL #4   Title  Pt will increase gait speed to 1.0 m/s or higher for safe community ambulator speed without AD.    Baseline  without AD=0.41ms    Time  8    Period  Weeks    Status  New    Target Date  09/29/19            Plan - 08/10/19 0909    Clinical Impression Statement  Pt continues to show progress with gait without AD with AFO donned and then without AFO donned. Showing better left ankle control on compliant surfaces without AFO.    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 Continue BWS over treadmill to allow for decreased UE support. Gait overground without AD and trying some without AFO. Resisted gait. Add in more dual task activities as well. Continued left ankle strengthening/stretching. Standing balance with more SLS activities and strengthening to left LE with more focus on glut med.    PT Home Exercise Plan  Access Code: [email or web address]    Consulted and Agree with Plan of Care  Patient    Family Member Consulted  wife       Patient will benefit from skilled therapeutic intervention in order to improve the following deficits and impairments:  Abnormal gait, Difficulty walking, Decreased balance, Decreased mobility, Decreased strength, Decreased knowledge of use of DME, Decreased endurance, Decreased activity tolerance, Decreased range of motion, Pain, Increased muscle spasms  Visit Diagnosis: Other abnormalities of gait and mobility  Muscle weakness (generalized)     Problem List Patient Active Problem List   Diagnosis Date Noted  . Essential hypertension 05/25/2019  . 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 Acute blood loss anemia   . Steroid-induced hyperglycemia   . Acute on chronic anemia   . Seizures ([place]   . Spastic hemiparesis ([place]   . ANCA-associated vasculitis ([place]   . Hypertension   . Thrombocytopenia ([place]   . Acute unilateral cerebral infarction in a watershed distribution ([facility] 01/15/2019  .  Weakness   . CVA (cerebral vascular accident) ([place]) 01/12/2019  . Malnutrition of moderate degree 07/31/2018  . Elevated serum protein level   . Multiple myeloma ([place])   . Pancytopenia ([place])   . Normocytic normochromic anemia 07/16/2018  . Acute kidney failure ([place]) 07/16/2018  . NICM (nonischemic cardiomyopathy) ([place]) 11/12/2013  . History  of ETOH abuse 11/12/2013  . Chronic combined systolic and diastolic CHF (congestive heart failure) ([place]) 11/12/2013  . Congestive dilated cardiomyopathy ([place]) 11/04/2013  . Malignant hypertension     Electa Sniff, PT, DPT, NCS 08/10/2019, 9:10 AM  [facility] [address] [place], [place], [address] Phone: [phone]   Fax:  [phone]  Name: NASIIR MONTS MRN: [id number] Date of Birth: 1961-02-24

## 2019-08-13 NOTE — Telephone Encounter (Signed)
[employer] Event monitor order cancelled. No Baseline. Pt returned Monitor on 08/11/19 without wearing.

## 2019-08-17 ENCOUNTER — Encounter (HOSPITAL_COMMUNITY): Payer: BC Managed Care – PPO

## 2019-08-24 ENCOUNTER — Other Ambulatory Visit: Payer: Self-pay

## 2019-08-24 ENCOUNTER — Ambulatory Visit: Payer: 59 | Attending: Physical Medicine & Rehabilitation

## 2019-08-24 DIAGNOSIS — I69354 Hemiplegia and hemiparesis following cerebral infarction affecting left non-dominant side: Secondary | ICD-10-CM | POA: Insufficient documentation

## 2019-08-24 DIAGNOSIS — R2689 Other abnormalities of gait and mobility: Secondary | ICD-10-CM | POA: Diagnosis not present

## 2019-08-24 DIAGNOSIS — M6281 Muscle weakness (generalized): Secondary | ICD-10-CM | POA: Diagnosis present

## 2019-08-24 DIAGNOSIS — R2681 Unsteadiness on feet: Secondary | ICD-10-CM | POA: Diagnosis present

## 2019-08-24 NOTE — Patient Instructions (Signed)
Access Code: [email or web address] URL: [email or web address] Date: 08/24/2019 Prepared by: Baldomero Lamy  Exercises Seated Hamstring Stretch with Strap - 3 x daily - 7 x weekly - 4 reps - 1 sets - 1 min hold Supine Short Arc Quad - 2 x daily - 7 x weekly - 10 reps - 1 sets Supine Bridge with Pelvic Floor Contraction on Swiss Ball - 2 x daily - 7 x weekly - 10 reps - 1 sets Supine Ankle Dorsiflexion Stretch with Caregiver - 3 x daily - 7 x weekly - 4 reps - 1 sets - 1 min hold Clamshell - 1 x daily - 7 x weekly - 5 reps - 2 sets Seated Knee Flexion - 1 x daily - 7 x weekly - 5 reps - 3 sets Prone Knee Flexion - 1 x daily - 5 x weekly - 10 reps - 2 sets Standing with Head Rotation - 2 x daily - 7 x weekly - 3 reps - 1 sets - 10 turns hold Standing Balance with Eyes Closed - 2 x daily - 7 x weekly - 2 reps - 1 sets - 30 sec hold Scapular Retraction with Resistance - 1 x daily - 7 x weekly - 10 reps - 3 sets Standing Gastroc Stretch at Counter - 2 x daily - 7 x weekly - 4 reps - 1 sets - 30 sec hold Heel rises with counter support - 2 x daily - 7 x weekly - 10 reps - 2 sets Tandem Stance - 1 x daily - 7 x weekly - 3 sets - 3 reps - 30 hold

## 2019-08-24 NOTE — Therapy (Signed)
[facility] [address] [place], [place], [address] Phone: [phone]   Fax:  [phone]  Physical Therapy Treatment  Patient Details  Name: Jeremy Grant MRN: [id number] Date of Birth: Jan 29, 1961 Referring Provider (PT): Dr. Letta Pate   Encounter Date: 08/24/2019  PT End of Session - 08/24/19 0935    Visit Number  69    Number of Visits  61    Date for PT Re-Evaluation  19/37/90   cert 90 days but poc 60 days   Authorization Type  [employer]    PT Start Time  [phone]    PT Stop Time  1014    PT Time Calculation (min)  43 min    Equipment Utilized During Treatment  Gait belt   left AFO   Activity Tolerance  Patient tolerated treatment well    Behavior During Therapy  [facility] for tasks assessed/performed       Past Medical History:  Diagnosis Date  . Arthritis   . CHF (congestive heart failure) ([place])   . Combined systolic and diastolic cardiac dysfunction    Echo 11/03/2013 EF 73%, grade 3 diastolic dysfunction  . Hypertension   . NICM (nonischemic cardiomyopathy) ([place])    a. L/RHC (11/05/13): RA: 3, RV 52/5, PA 49/19 (31), PCWP 10, AO 166/93, PA 67%, Fick CO/CI: 5.71/2.97, Lmain: normal, LAD: large, without signficant dz, first diagonal has 20% dz at ostium, LCx: normal, RCA: 30% stenosis at the bifurcation of PDA and PLOM    Past Surgical History:  Procedure Laterality Date  . BIOPSY  01/31/2019   Procedure: BIOPSY;  Surgeon: Otis Brace, MD;  Location: [facility] ENDOSCOPY;  Service: Gastroenterology;;  . ESOPHAGOGASTRODUODENOSCOPY N/A 01/31/2019   Procedure: ESOPHAGOGASTRODUODENOSCOPY (EGD);  Surgeon: Otis Brace, MD;  Location: [facility] ENDOSCOPY;  Service: Gastroenterology;  Laterality: N/A;  . IR FLUORO GUIDE CV LINE RIGHT  07/25/2018  . IR US GUIDE VASC ACCESS RIGHT  07/25/2018  . LEFT AND RIGHT HEART CATHETERIZATION WITH CORONARY ANGIOGRAM N/A 11/05/2013   Procedure: LEFT AND RIGHT HEART CATHETERIZATION WITH  CORONARY ANGIOGRAM;  Surgeon: Peter M Martinique, MD;  Location: [facility] CATH LAB;  Service: Cardiovascular;  Laterality: N/A;  . RIGHT HEART CATHETERIZATION N/A 01/14/2014   Procedure: RIGHT HEART CATH;  Surgeon: Larey Dresser, MD;  Location: [facility] CATH LAB;  Service: Cardiovascular;  Laterality: N/A;    There were no vitals filed for this visit.  Subjective Assessment - 08/24/19 0933    Subjective  Pt reports that he is doing well since his last session. Reports being able to walk up his parents steps that are steep. No falls reported. Continues to complete HEP with no issues at this time.    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 8/31-9/3 then inpatient rehab 9/3-9/23.PMH: CHF, HTN, hyperlipidemia, glomerulonephritis, pancytopenia/chronic anemia, seizure left-sided post stroke    Patient Stated Goals  Pt wants to be able to walk again.    Currently in Pain?  No/denies                       [facility] Adult PT Treatment/Exercise - 08/24/19 0936      Ambulation/Gait   Ambulation/Gait  Yes    Ambulation/Gait Assistance  5: Supervision    Ambulation/Gait Assistance Details  AFO donned. Completed w/o AD.     Ambulation Distance (Feet)  345 Feet    Assistive device  None  Gait Pattern  Step-through pattern;Decreased stance time - left    Ambulation Surface  Level;Indoor      Neuro Re-ed    Neuro Re-ed Details   Completed dynamic gait activities in hall, including marching forwards and backwards walking 40-50' x 4, horizontal head turns x 4, vertical head turns x 4. Forward stepping over hurdles 40' x 5. Utilization of wall for imbalance and intermittent CGA for steadying as needed. Verbal cues for improved hip flexion of LLE for clearance of hurdles.  In // bars completed tandem walking over blue mat 10' x 5 with light UE support from bars as needed. Completed static tandem stance 4 x 30 seconds each on  blue mat. Standing on rocker board maintaining level 2 x 30 sec without UE support, controlled weight shift on rockerboard positioned right/left 10 x 2 bilateral without UE support.             PT Education - 08/24/19 1016    Education Details  Updated HEP to include Tandem Stance. Educated to complete in corner of house with chair placed in front for improved safety.    Person(s) Educated  Patient    Methods  Explanation;Handout    Comprehension  Verbalized understanding       PT Short Term Goals - 07/31/19 1040      PT SHORT TERM GOAL #1   Title  Pt will be independent with progressive HEP for strengthening, balance and gait.    Time  4    Period  Weeks    Status  New    Target Date  08/30/19      PT SHORT TERM GOAL #2   Title  Pt will ambulate >500' on varied surfaces with SPC mod I for improved community mobility.    Time  4    Period  Weeks    Status  New    Target Date  08/30/19      PT SHORT TERM GOAL #3   Title  Pt will be able to perform sit to stand without UE support from standard chair consistently for improved functional strength.    Baseline  not consistent without UE support from chair. Able to perform inconsistently with hands in lap relying on momentum    Time  4    Period  Weeks    Status  On-going        PT Long Term Goals - 07/31/19 1042      PT LONG TERM GOAL #1   Title  Pt will ambulate >1000' without AD  on varied surfaces mod I for improved community mobility.    Time  8    Period  Weeks    Status  New    Target Date  09/29/19      PT LONG TERM GOAL #2   Title  Pt will increase FGA from 18/30 to >22/30    Baseline  18/30 on 07/31/19    Time  8    Period  Weeks    Status  New    Target Date  09/29/19      PT LONG TERM GOAL #3   Title  Pt will ambulate up/down 4 steps in reciprocal pattern without railing independently for improved functional strength and balance.    Time  8    Period  Weeks    Status  New    Target Date  09/29/19       PT LONG TERM GOAL #4   Title  Pt will increase gait speed to 1.0 m/s or higher for safe community ambulator speed without AD.    Baseline  without AD=0.23ms    Time  8    Period  Weeks    Status  New    Target Date  09/29/19            Plan - 08/24/19 1024    Clinical Impression Statement  Today's session focused on dynamic gait activities without AD and AFO donned. Pt demonstrating improvements with increasing LLE hip flexion during ambulation and navigating over obstacles. Further progressed balance training, and updated HEP to include tandem stance for further improvements of balance.    Personal Factors and Comorbidities  Comorbidity 3+    Comorbidities  CHF, HTN, hyperlipidemia, glomerulonephritis, pancytopenia/chronic anemia, seizure    Examination-Activity Limitations  Stairs;Stand;Squat;Locomotion Level;Transfers;Bed Mobility    Examination-Participation Restrictions  Community Activity;Shop;Driving;Cleaning;Yard Work    SMerchant navy officer Evolving/Moderate complexity    Rehab Potential  Good    PT Frequency  2x / week    PT Duration  8 weeks    PT Treatment/Interventions  DME Instruction;ADLs/Self Care Home Management;Gait training;Stair training;Functional mobility training;Therapeutic activities;Therapeutic exercise;Balance training;Orthotic Fit/Training;Patient/family education;Neuromuscular re-education;Wheelchair mobility training;Passive range of motion;Manual techniques    PT Next Visit Plan  Complete BWS over treadmill to allow for decreased UE support. Resisted gait, and add in more dual task activities as well. Continued left ankle strengthening/stretching. Continue gait overground with more dynamic activities.    PT Home Exercise Plan  Access Code: [email or web address]    Consulted and Agree with Plan of Care  Patient    Family Member Consulted  wife       Patient will benefit from skilled therapeutic intervention in order to improve the following  deficits and impairments:  Abnormal gait, Difficulty walking, Decreased balance, Decreased mobility, Decreased strength, Decreased knowledge of use of DME, Decreased endurance, Decreased activity tolerance, Decreased range of motion, Pain, Increased muscle spasms  Visit Diagnosis: Other abnormalities of gait and mobility  Muscle weakness (generalized)  Hemiplegia and hemiparesis following cerebral infarction affecting left non-dominant side (HCC)  Unsteadiness on feet     Problem List Patient Active Problem List   Diagnosis Date Noted  . Essential hypertension 05/25/2019  . 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 Acute blood loss anemia   . Steroid-induced hyperglycemia   . Acute on chronic anemia   . Seizures ([place]   . Spastic [place]iparesis ([place]   . 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 Hypertension   . Thrombocytopenia ([place]   . Acute unilateral cerebral infarction in a watershed distribution ([facility] 01/15/2019  . Weakness   . CVA (cerebral vascular accident) ([place] 01/12/2019  . Malnutrition of moderate degree 07/31/2018  . Elevated serum protein level   . Multiple myeloma ([place]   . Pancytopenia ([place]   . Normocytic normochromic anemia 07/16/2018  . Acute kidney failure ([place] 07/16/2018  . NICM (nonischemic cardiomyopathy) ([place] 11/12/2013  . History of ETOH abuse 11/12/2013  . Chronic combined systolic and diastolic CHF (congestive heart failure) ([place] 11/12/2013  . Congestive dilated cardiomyopathy ([place] 11/04/2013  . Malignant hypertension     KJones Bales PT, DPT 08/24/2019, 10:28 AM  [facility] [address]Phone: [phone]  Fax:  [phone] Name: Jeremy HELTMRN: [id number] of Birth: [birth year]

## 2019-08-28 ENCOUNTER — Other Ambulatory Visit: Payer: Self-pay

## 2019-08-28 ENCOUNTER — Ambulatory Visit: Payer: 59

## 2019-08-28 DIAGNOSIS — R2689 Other abnormalities of gait and mobility: Secondary | ICD-10-CM

## 2019-08-28 DIAGNOSIS — I69354 Hemiplegia and hemiparesis following cerebral infarction affecting left non-dominant side: Secondary | ICD-10-CM

## 2019-08-28 DIAGNOSIS — M6281 Muscle weakness (generalized): Secondary | ICD-10-CM

## 2019-08-28 DIAGNOSIS — R2681 Unsteadiness on feet: Secondary | ICD-10-CM

## 2019-08-28 NOTE — Therapy (Signed)
[facility] [address] [place], [place], [address] Phone: [phone]   Fax:  [phone]  Physical Therapy Treatment  Patient Details  Name: Jeremy Grant MRN: [id number] Date of Birth: May 04, 1961 Referring Provider (PT): Dr. Letta Pate   Encounter Date: 08/28/2019  PT End of Session - 08/28/19 1043    Visit Number  50    Number of Visits  61    Date for PT Re-Evaluation  37/04/88   cert 90 days but poc 60 days   Authorization Type  [employer] (Pt has recent change in insurance)    PT Start Time  [phone]    PT Stop Time  1015    PT Time Calculation (min)  44 min    Equipment Utilized During Treatment  Gait belt   left AFO   Activity Tolerance  Patient tolerated treatment well    Behavior During Therapy  [facility] for tasks assessed/performed       Past Medical History:  Diagnosis Date  . Arthritis   . CHF (congestive heart failure) ([place])   . Combined systolic and diastolic cardiac dysfunction    Echo 11/03/2013 EF 94%, grade 3 diastolic dysfunction  . Hypertension   . NICM (nonischemic cardiomyopathy) ([place])    a. L/RHC (11/05/13): RA: 3, RV 52/5, PA 49/19 (31), PCWP 10, AO 166/93, PA 67%, Fick CO/CI: 5.71/2.97, Lmain: normal, LAD: large, without signficant dz, first diagonal has 20% dz at ostium, LCx: normal, RCA: 30% stenosis at the bifurcation of PDA and PLOM    Past Surgical History:  Procedure Laterality Date  . BIOPSY  01/31/2019   Procedure: BIOPSY;  Surgeon: Otis Brace, MD;  Location: [place] ENDOSCOPY;  Service: Gastroenterology;;  . ESOPHAGOGASTRODUODENOSCOPY N/A 01/31/2019   Procedure: ESOPHAGOGASTRODUODENOSCOPY (EGD);  Surgeon: Otis Brace, MD;  Location: [facility] ENDOSCOPY;  Service: Gastroenterology;  Laterality: N/A;  . IR FLUORO GUIDE CV LINE RIGHT  07/25/2018  . IR US GUIDE VASC ACCESS RIGHT  07/25/2018  . LEFT AND RIGHT HEART CATHETERIZATION WITH CORONARY ANGIOGRAM N/A 11/05/2013   Procedure:  LEFT AND RIGHT HEART CATHETERIZATION WITH CORONARY ANGIOGRAM;  Surgeon: Peter M Martinique, MD;  Location: [facility] CATH LAB;  Service: Cardiovascular;  Laterality: N/A;  . RIGHT HEART CATHETERIZATION N/A 01/14/2014   Procedure: RIGHT HEART CATH;  Surgeon: Larey Dresser, MD;  Location: [facility] CATH LAB;  Service: Cardiovascular;  Laterality: N/A;    There were no vitals filed for this visit.  Subjective Assessment - 08/28/19 0934    Subjective  Pt reports that everything is going well, has been mowing and doing outside work. Did have some pain in his L hip after riding the mower for an extended period of time.No Falls.    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 8/31-9/3 then inpatient rehab 9/3-9/23.PMH: CHF, HTN, hyperlipidemia, glomerulonephritis, pancytopenia/chronic anemia, seizure left-sided post stroke    Patient Stated Goals  Pt wants to be able to walk again.    Currently in Pain?  Yes    Pain Score  2     Pain Location  Hip    Pain Orientation  Left    Pain Descriptors / Indicators  Sore    Pain Type  Acute pain    Pain Onset  In the past 7 days    Pain Relieving Factors  tylenol  [place] Adult PT Treatment/Exercise - 08/28/19 1029      Transfers   Transfers  Sit to Stand;Stand to Sit    Sit to Stand  6: Modified independent (Device/Increase time);From chair/3-in-1;Other (comment)    Sit to Stand Details  Verbal cues for technique    Five time sit to stand comments   x 5 reps. completed from standard height chair. Pt utilizing momentum for extension and still tends to push UE off of lower extremity to assist.     Stand to Sit  6: Modified independent (Device/Increase time)    Stand to Sit Details (indicate cue type and reason)  Verbal cues for technique      Ambulation/Gait   Ambulation/Gait  Yes    Ambulation/Gait Assistance  6: Modified independent (Device/Increase  time);5: Supervision    Ambulation/Gait Assistance Details  Mod I for ambulation over indoor and outdoor surfaces including pavement and grass; Supv for safety with ambulation over gravel     Ambulation Distance (Feet)  650 Feet    Assistive device  Straight cane    Gait Pattern  Step-through pattern;Decreased stance time - left    Ambulation Surface  Level;Indoor;Unlevel;Outdoor;Paved;Gravel;Grass    Gait Comments  Also completed resisted gait with sidestepping in // bars, 10' x 6. Verbal cues for ensuring patient faced forward, demonstrating increased trunk and LE rotation in an attempt to compensate.       High Level Balance   High Level Balance Comments         Neuro Re-ed    Neuro Re-ed Details   Completed balance activities in // bars: on rocker board (positioned ant/post) holding steady x 2 minutes without UE support, progression of adding in ant/post/lateral manual perturbations from PT to further promote ankle strategies x 2 minutes. Holding rocker board steady added in alternating UE raises to further challenge dynamic balance, 2 x10 reps. Completed alternating toe taps to cones on rocker board 2 x10 reps, utilizing light UE support from one extremity. Verbal cues for upright posture with alternating toe taps. Progressed to BOSU ball with focus on holding steady, progressed from BUE support to single UE support to no UE x 5 minutes. Patient requiring CGA for steadying as needed.                PT Short Term Goals - 08/28/19 0939      PT SHORT TERM GOAL #1   Title  Pt will be independent with progressive HEP for strengthening, balance and gait.    Baseline  continue to progress HEP at this time    Time  4    Period  Weeks    Status  On-going    Target Date  08/30/19      PT SHORT TERM GOAL #2   Title  Pt will ambulate >500' on varied surfaces with SPC mod I for improved community mobility.    Baseline  Ambulated 650' on varied surfaces (grass, gravel, unlevel pacement)  with SPC, Mod I for all, supv for gravel.    Time  4    Period  Weeks    Status  Partially Met    Target Date  08/30/19      PT SHORT TERM GOAL #3   Title  Pt will be able to perform sit to stand without UE support from standard chair consistently for improved functional strength.    Baseline  completed 5 reps from standard chair, continues to perform inconsistently with hands in lap relying  on momentum    Time  4    Period  Weeks    Status  On-going        PT Long Term Goals - 07/31/19 1042      PT LONG TERM GOAL #1   Title  Pt will ambulate >1000' without AD  on varied surfaces mod I for improved community mobility.    Time  8    Period  Weeks    Status  New    Target Date  09/29/19      PT LONG TERM GOAL #2   Title  Pt will increase FGA from 18/30 to >22/30    Baseline  18/30 on 07/31/19    Time  8    Period  Weeks    Status  New    Target Date  09/29/19      PT LONG TERM GOAL #3   Title  Pt will ambulate up/down 4 steps in reciprocal pattern without railing independently for improved functional strength and balance.    Time  8    Period  Weeks    Status  New    Target Date  09/29/19      PT LONG TERM GOAL #4   Title  Pt will increase gait speed to 1.0 m/s or higher for safe community ambulator speed without AD.    Baseline  without AD=0.58ms    Time  8    Period  Weeks    Status  New    Target Date  09/29/19            Plan - 08/28/19 1044    Clinical Impression Statement  Reassessed short term goals in today's session with patient demonstating progress toward all goals. Patient still demonstrates some unsteadiness on unlevel surfaces requiring supervision. Continued progression of balance and strengthening activities today, with patient tolerating well. Pt will continue to benefit from skilled PT services to address impairments.    Personal Factors and Comorbidities  Comorbidity 3+    Comorbidities  CHF, HTN, hyperlipidemia, glomerulonephritis,  pancytopenia/chronic anemia, seizure    Examination-Activity Limitations  Stairs;Stand;Squat;Locomotion Level;Transfers;Bed Mobility    Examination-Participation Restrictions  Community Activity;Shop;Driving;Cleaning;Yard Work    SMerchant navy officer Evolving/Moderate complexity    Rehab Potential  Good    PT Frequency  2x / week    PT Duration  8 weeks    PT Treatment/Interventions  DME Instruction;ADLs/Self Care Home Management;Gait training;Stair training;Functional mobility training;Therapeutic activities;Therapeutic exercise;Balance training;Orthotic Fit/Training;Patient/family education;Neuromuscular re-education;Wheelchair mobility training;Passive range of motion;Manual techniques    PT Next Visit Plan  Complete BWS over treadmill to allow for decreased UE support. Resisted gait, and add in more dual task activities as well. Continue dynamic balance activities and dynamic gait overground    PT Home Exercise Plan  Access Code: [email or web address]    Consulted and Agree with Plan of Care  Patient    Family Member Consulted  wife       Patient will benefit from skilled therapeutic intervention in order to improve the following deficits and impairments:  Abnormal gait, Difficulty walking, Decreased balance, Decreased mobility, Decreased strength, Decreased knowledge of use of DME, Decreased endurance, Decreased activity tolerance, Decreased range of motion, Pain, Increased muscle spasms  Visit Diagnosis: Other abnormalities of gait and mobility  Muscle weakness (generalized)  Unsteadiness on feet  Hemiplegia and hemiparesis following cerebral infarction affecting left non-dominant side ([facility]     Problem List Patient Active Problem List   Diagnosis Date Noted  .  Essential hypertension 05/25/2019  . 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 Acute blood loss anemia   . Steroid-induced hyperglycemia   . Acute on chronic anemia   . Seizures ([place])   . Spastic hemiparesis ([place])   . ANCA-associated  vasculitis ([place])   . Hypertension   . Thrombocytopenia ([place])   . Acute unilateral cerebral infarction in a watershed distribution [facility]) 01/15/2019  . Weakness   . CVA (cerebral vascular accident) ([place]) 01/12/2019  . Malnutrition of moderate degree 07/31/2018  . Elevated serum protein level   . Multiple myeloma ([place])   . Pancytopenia ([place])   . Normocytic normochromic anemia 07/16/2018  . Acute kidney failure ([place]) 07/16/2018  . NICM (nonischemic cardiomyopathy) ([place]) 11/12/2013  . History of ETOH abuse 11/12/2013  . Chronic combined systolic and diastolic CHF (congestive heart failure) ([place]) 11/12/2013  . Congestive dilated cardiomyopathy ([place]) 11/04/2013  . Malignant hypertension     Jones Bales, PT, DPT  08/28/2019, 10:49 AM  [facility] [address] [place], [place], [address] Phone: [phone]   Fax:  [phone]  Name: JONOVAN BOEDECKER MRN: [id number] Date of Birth: 06/26/1960

## 2019-08-31 ENCOUNTER — Ambulatory Visit: Payer: 59

## 2019-08-31 ENCOUNTER — Other Ambulatory Visit: Payer: Self-pay

## 2019-08-31 DIAGNOSIS — M6281 Muscle weakness (generalized): Secondary | ICD-10-CM

## 2019-08-31 DIAGNOSIS — R2689 Other abnormalities of gait and mobility: Secondary | ICD-10-CM

## 2019-08-31 NOTE — Therapy (Signed)
[facility] [address] [place], [place], [address] Phone: [phone]   Fax:  [phone]  Physical Therapy Treatment  Patient Details  Name: Jeremy Grant MRN: [id number] Date of Birth: 1961/02/09 Referring Provider (PT): Dr. Letta Pate   Encounter Date: 08/31/2019  PT End of Session - 08/31/19 0929    Visit Number  51    Number of Visits  61    Date for PT Re-Evaluation  58/52/77   cert 90 days but poc 60 days   Authorization Type  [employer] (Pt has recent change in insurance)    PT Start Time  [phone]    PT Stop Time  1017    PT Time Calculation (min)  52 min    Equipment Utilized During Treatment  Gait belt   left AFO   Activity Tolerance  Patient tolerated treatment well    Behavior During Therapy  [facility] for tasks assessed/performed       Past Medical History:  Diagnosis Date  . Arthritis   . CHF (congestive heart failure) ([place])   . Combined systolic and diastolic cardiac dysfunction    Echo 11/03/2013 EF 35%, grade 3 diastolic dysfunction  . Hypertension   . NICM (nonischemic cardiomyopathy) ([place])    a. L/RHC (11/05/13): RA: 3, RV 52/5, PA 49/19 (31), PCWP 10, AO 166/93, PA 67%, Fick CO/CI: 5.71/2.97, Lmain: normal, LAD: large, without signficant dz, first diagonal has 20% dz at ostium, LCx: normal, RCA: 30% stenosis at the bifurcation of PDA and PLOM    Past Surgical History:  Procedure Laterality Date  . BIOPSY  01/31/2019   Procedure: BIOPSY;  Surgeon: Otis Brace, MD;  Location: [place] ENDOSCOPY;  Service: Gastroenterology;;  . ESOPHAGOGASTRODUODENOSCOPY N/A 01/31/2019   Procedure: ESOPHAGOGASTRODUODENOSCOPY (EGD);  Surgeon: Otis Brace, MD;  Location: [facility] ENDOSCOPY;  Service: Gastroenterology;  Laterality: N/A;  . IR FLUORO GUIDE CV LINE RIGHT  07/25/2018  . IR US GUIDE VASC ACCESS RIGHT  07/25/2018  . LEFT AND RIGHT HEART CATHETERIZATION WITH CORONARY ANGIOGRAM N/A 11/05/2013   Procedure:  LEFT AND RIGHT HEART CATHETERIZATION WITH CORONARY ANGIOGRAM;  Surgeon: Peter M Martinique, MD;  Location: [facility] CATH LAB;  Service: Cardiovascular;  Laterality: N/A;  . RIGHT HEART CATHETERIZATION N/A 01/14/2014   Procedure: RIGHT HEART CATH;  Surgeon: Larey Dresser, MD;  Location: [facility] CATH LAB;  Service: Cardiovascular;  Laterality: N/A;    There were no vitals filed for this visit.  Subjective Assessment - 08/31/19 0930    Subjective  Pt reports that he is doing well. He has new shoes today so will see how they do. He needs to cut down to 1x/week due to high copay on new insurance.    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 8/31-9/3 then inpatient rehab 9/3-9/23.PMH: CHF, HTN, hyperlipidemia, glomerulonephritis, pancytopenia/chronic anemia, seizure left-sided post stroke    Patient Stated Goals  Pt wants to be able to walk again.    Currently in Pain?  No/denies    Pain Onset  In the past 7 days                       [facility] Adult PT Treatment/Exercise - 08/31/19 0931      Transfers   Transfers  Sit to Stand;Stand to Sit    Sit to Stand  6: Modified independent (Device/Increase time)    Stand to Sit  6: Modified  independent (Device/Increase time)      Ambulation/Gait   Ambulation/Gait  Yes    Ambulation/Gait Assistance  5: Supervision    Ambulation/Gait Assistance Details  Pt ambulated 115' with marching and 115' with large steps. Pt was instructed to increase heel strike on left.    Ambulation Distance (Feet)  460 Feet    Assistive device  None    Gait Pattern  Step-through pattern;Decreased step length - right    Ambulation Surface  Level;Indoor    Gait Comments  Pt ambulated on treadmill in BWS for safety. 8 min at 1.45mh with 1 min bilateral UE, 1 UE support 1 min then no UE support with verbal cues for increased step length. Pt ambulated x 5 min at 3% incline at 1.476m. BP=158/90  after      Neuro Re-ed    Neuro Re-ed Details   In // bars: walking on toes and then heels x 2 laps each with light UE support. Standing on rockerboard positioned ant/post alternating toe taps on cone with 1 light UE support x 10.             PT Education - 08/31/19 1028    Education Details  Discussed dropping down to 1x/week per patient request due to copay.    Person(s) Educated  Patient    Methods  Explanation    Comprehension  Verbalized understanding       PT Short Term Goals - 08/28/19 0939      PT SHORT TERM GOAL #1   Title  Pt will be independent with progressive HEP for strengthening, balance and gait.    Baseline  continue to progress HEP at this time    Time  4    Period  Weeks    Status  On-going    Target Date  08/30/19      PT SHORT TERM GOAL #2   Title  Pt will ambulate >500' on varied surfaces with SPC mod I for improved community mobility.    Baseline  Ambulated 650' on varied surfaces (grass, gravel, unlevel pacement) with SPC, Mod I for all, supv for gravel.    Time  4    Period  Weeks    Status  Partially Met    Target Date  08/30/19      PT SHORT TERM GOAL #3   Title  Pt will be able to perform sit to stand without UE support from standard chair consistently for improved functional strength.    Baseline  completed 5 reps from standard chair, continues to perform inconsistently with hands in lap relying on momentum    Time  4    Period  Weeks    Status  On-going        PT Long Term Goals - 07/31/19 1042      PT LONG TERM GOAL #1   Title  Pt will ambulate >1000' without AD  on varied surfaces mod I for improved community mobility.    Time  8    Period  Weeks    Status  New    Target Date  09/29/19      PT LONG TERM GOAL #2   Title  Pt will increase FGA from 18/30 to >22/30    Baseline  18/30 on 07/31/19    Time  8    Period  Weeks    Status  New    Target Date  09/29/19      PT LONG TERM GOAL #3  Title  Pt will ambulate up/down 4  steps in reciprocal pattern without railing independently for improved functional strength and balance.    Time  8    Period  Weeks    Status  New    Target Date  09/29/19      PT LONG TERM GOAL #4   Title  Pt will increase gait speed to 1.0 m/s or higher for safe community ambulator speed without AD.    Baseline  without AD=0.14ms    Time  8    Period  Weeks    Status  New    Target Date  09/29/19            Plan - 08/31/19 1030    Clinical Impression Statement  PT continued to focus on gait progression with less assistance including on treadmill. Added in elevation to treadmill today. Pt does fatigue as goes on.    Personal Factors and Comorbidities  Comorbidity 3+    Comorbidities  CHF, HTN, hyperlipidemia, glomerulonephritis, pancytopenia/chronic anemia, seizure    Examination-Activity Limitations  Stairs;Stand;Squat;Locomotion Level;Transfers;Bed Mobility    Examination-Participation Restrictions  Community Activity;Shop;Driving;Cleaning;Yard Work    SMerchant navy officer Evolving/Moderate complexity    Rehab Potential  Good    PT Frequency  2x / week    PT Duration  8 weeks    PT Treatment/Interventions  DME Instruction;ADLs/Self Care Home Management;Gait training;Stair training;Functional mobility training;Therapeutic activities;Therapeutic exercise;Balance training;Orthotic Fit/Training;Patient/family education;Neuromuscular re-education;Wheelchair mobility training;Passive range of motion;Manual techniques    PT Next Visit Plan  Complete BWS over treadmill to allow for decreased UE support. Resisted gait, and add in more dual task activities as well. Continue dynamic balance activities and dynamic gait overground    PT Home Exercise Plan  Access Code: [email or web address]    Consulted and Agree with Plan of Care  Patient    Family Member Consulted  wife       Patient will benefit from skilled therapeutic intervention in order to improve the following deficits and  impairments:  Abnormal gait, Difficulty walking, Decreased balance, Decreased mobility, Decreased strength, Decreased knowledge of use of DME, Decreased endurance, Decreased activity tolerance, Decreased range of motion, Pain, Increased muscle spasms  Visit Diagnosis: Other abnormalities of gait and mobility  Muscle weakness (generalized)     Problem List Patient Active Problem List   Diagnosis Date Noted  . Essential hypertension 05/25/2019  . 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 Acute blood loss anemia   . Steroid-induced hyperglycemia   . Acute on chronic anemia   . Seizures ([place]   . Spastic hemiparesis ([place]   . 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 Hypertension   . Thrombocytopenia ([place]   . Acute unilateral cerebral infarction in a watershed distribution ([facility] 01/15/2019  . Weakness   . CVA (cerebral vascular accident) ([place] 01/12/2019  . Malnutrition of moderate degree 07/31/2018  . Elevated serum protein level   . Multiple myeloma (HL[place]   . Pancytopenia ([place]   . Normocytic normochromic anemia 07/16/2018  . Acute kidney failure ([facility] [place] 07/16/2018  . NICM (nonischemic cardiomyopathy) ([place] 11/12/2013  . History of ETOH abuse 11/12/2013  . Chronic combined systolic and diastolic CHF (congestive heart failure) ([place] 11/12/2013  . Congestive dilated cardiomyopathy ([place] 11/04/2013  . Malignant hypertension     EElecta Sniff PT, DPT, NCS 08/31/2019, 10:31 AM  [facility] [address]Phone: [phone]  Fax:  [phone] Name: DKIANDRE SPAGNOLOMRN:  [id number] Date of Birth: 10/02/1960

## 2019-09-04 ENCOUNTER — Ambulatory Visit: Payer: BC Managed Care – PPO

## 2019-09-07 ENCOUNTER — Ambulatory Visit: Payer: 59

## 2019-09-07 ENCOUNTER — Other Ambulatory Visit: Payer: Self-pay

## 2019-09-07 DIAGNOSIS — M6281 Muscle weakness (generalized): Secondary | ICD-10-CM

## 2019-09-07 DIAGNOSIS — R2689 Other abnormalities of gait and mobility: Secondary | ICD-10-CM

## 2019-09-07 NOTE — Therapy (Signed)
[facility] [address] [place], [place], [address] Phone: [phone]   Fax:  [phone]  Physical Therapy Treatment  Patient Details  Name: Jeremy Grant MRN: [id number] Date of Birth: 03-Oct-1960 Referring Provider (PT): Dr. Letta Pate   Encounter Date: 09/07/2019  PT End of Session - 09/07/19 0942    Visit Number  52    Number of Visits  61    Date for PT Re-Evaluation  25/95/63   cert 90 days but poc 60 days   Authorization Type  [employer] (Pt has recent change in insurance)    PT Start Time  [phone]    PT Stop Time  1008    PT Time Calculation (min)  43 min    Equipment Utilized During Treatment  Gait belt   left AFO   Activity Tolerance  Patient tolerated treatment well    Behavior During Therapy  [facility] for tasks assessed/performed       Past Medical History:  Diagnosis Date  . Arthritis   . CHF (congestive heart failure) ([place])   . Combined systolic and diastolic cardiac dysfunction    Echo 11/03/2013 EF 43%, grade 3 diastolic dysfunction  . Hypertension   . NICM (nonischemic cardiomyopathy) ([place])    a. L/RHC (11/05/13): RA: 3, RV 52/5, PA 49/19 (31), PCWP 10, AO 166/93, PA 67%, Fick CO/CI: 5.71/2.97, Lmain: normal, LAD: large, without signficant dz, first diagonal has 20% dz at ostium, LCx: normal, RCA: 30% stenosis at the bifurcation of PDA and PLOM    Past Surgical History:  Procedure Laterality Date  . BIOPSY  01/31/2019   Procedure: BIOPSY;  Surgeon: Otis Brace, MD;  Location: [place] ENDOSCOPY;  Service: Gastroenterology;;  . ESOPHAGOGASTRODUODENOSCOPY N/A 01/31/2019   Procedure: ESOPHAGOGASTRODUODENOSCOPY (EGD);  Surgeon: Otis Brace, MD;  Location: [facility] ENDOSCOPY;  Service: Gastroenterology;  Laterality: N/A;  . IR FLUORO GUIDE CV LINE RIGHT  07/25/2018  . IR US GUIDE VASC ACCESS RIGHT  07/25/2018  . LEFT AND RIGHT HEART CATHETERIZATION WITH CORONARY ANGIOGRAM N/A 11/05/2013   Procedure:  LEFT AND RIGHT HEART CATHETERIZATION WITH CORONARY ANGIOGRAM;  Surgeon: Peter M Martinique, MD;  Location: [facility] CATH LAB;  Service: Cardiovascular;  Laterality: N/A;  . RIGHT HEART CATHETERIZATION N/A 01/14/2014   Procedure: RIGHT HEART CATH;  Surgeon: Larey Dresser, MD;  Location: [facility] CATH LAB;  Service: Cardiovascular;  Laterality: N/A;    There were no vitals filed for this visit.  Subjective Assessment - 09/07/19 0925    Subjective  Pt reports he is doing well. He has been working on exercises. Did a lot of walking on some hills the other day but was a little sore.    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 8/31-9/3 then inpatient rehab 9/3-9/23.PMH: CHF, HTN, hyperlipidemia, glomerulonephritis, pancytopenia/chronic anemia, seizure left-sided post stroke    Patient Stated Goals  Pt wants to be able to walk again.    Currently in Pain?  No/denies    Pain Onset  In the past 7 days                       [facility] Adult PT Treatment/Exercise - 09/07/19 3295      Ambulation/Gait   Ambulation/Gait  Yes    Ambulation/Gait Assistance  5: Supervision    Ambulation/Gait Assistance Details  Pt only needed min assist on steep hill going up due to lack of  DF and patient leaning posterior with ascent.    Ambulation Distance (Feet)  1000 Feet    Assistive device  None    Gait Pattern  Step-through pattern    Ambulation Surface  Level;Unlevel;Outdoor;Paved;Grass;Other (comment)   mulch   Gait Comments  Pt performed gait around in clinic 575' without AD with left AFO with cues for large steps then manual pertubations in varied directions x 230' then posterior resisted gait the remaining.  Pt ambulated another 57' without AD and left AFO removed x 115' then remaining distance with posterior resisted gait to try to facilitate more toe off.      Neuro Re-ed    Neuro Re-ed Details   Standing on airex with  tapping soccerball with RLE x 10 with occasional left UE support. Then standing in SLS on airex with RLE on soccerball 30 sec x 2 with touching every 7-8 sec. Alternating toe taps on soccerball standing on airex x 10 without UE support CGA.      Exercises   Exercises  Other Exercises    Other Exercises   Standing on edge of step with forefeet lowering down and then raising back up x 10.             PT Education - 09/07/19 1811    Education Details  Pt to continue with current HEP    Person(s) Educated  Patient    Methods  Explanation    Comprehension  Verbalized understanding       PT Short Term Goals - 08/28/19 0939      PT SHORT TERM GOAL #1   Title  Pt will be independent with progressive HEP for strengthening, balance and gait.    Baseline  continue to progress HEP at this time    Time  4    Period  Weeks    Status  On-going    Target Date  08/30/19      PT SHORT TERM GOAL #2   Title  Pt will ambulate >500' on varied surfaces with SPC mod I for improved community mobility.    Baseline  Ambulated 650' on varied surfaces (grass, gravel, unlevel pacement) with SPC, Mod I for all, supv for gravel.    Time  4    Period  Weeks    Status  Partially Met    Target Date  08/30/19      PT SHORT TERM GOAL #3   Title  Pt will be able to perform sit to stand without UE support from standard chair consistently for improved functional strength.    Baseline  completed 5 reps from standard chair, continues to perform inconsistently with hands in lap relying on momentum    Time  4    Period  Weeks    Status  On-going        PT Long Term Goals - 07/31/19 1042      PT LONG TERM GOAL #1   Title  Pt will ambulate >1000' without AD  on varied surfaces mod I for improved community mobility.    Time  8    Period  Weeks    Status  New    Target Date  09/29/19      PT LONG TERM GOAL #2   Title  Pt will increase FGA from 18/30 to >22/30    Baseline  18/30 on 07/31/19    Time  8     Period  Weeks    Status  New  Target Date  09/29/19      PT LONG TERM GOAL #3   Title  Pt will ambulate up/down 4 steps in reciprocal pattern without railing independently for improved functional strength and balance.    Time  8    Period  Weeks    Status  New    Target Date  09/29/19      PT LONG TERM GOAL #4   Title  Pt will increase gait speed to 1.0 m/s or higher for safe community ambulator speed without AD.    Baseline  without AD=0.76ms    Time  8    Period  Weeks    Status  New    Target Date  09/29/19            Plan - 09/07/19 1812    Clinical Impression Statement  Pt continues to show improved strength in LLE with less pelvic drop during gait. Continued to focus on gait on varied surfaces without AD as well as gait without AFO with improving stability.    Personal Factors and Comorbidities  Comorbidity 3+    Comorbidities  CHF, HTN, hyperlipidemia, glomerulonephritis, pancytopenia/chronic anemia, seizure    Examination-Activity Limitations  Stairs;Stand;Squat;Locomotion Level;Transfers;Bed Mobility    Examination-Participation Restrictions  Community Activity;Shop;Driving;Cleaning;Yard Work    SMerchant navy officer Evolving/Moderate complexity    Rehab Potential  Good    PT Frequency  2x / week    PT Duration  8 weeks    PT Treatment/Interventions  DME Instruction;ADLs/Self Care Home Management;Gait training;Stair training;Functional mobility training;Therapeutic activities;Therapeutic exercise;Balance training;Orthotic Fit/Training;Patient/family education;Neuromuscular re-education;Wheelchair mobility training;Passive range of motion;Manual techniques    PT Next Visit Plan  Complete BWS over treadmill to allow for decreased UE support. Resisted gait, and add in more dual task activities as well. Continue dynamic balance activities and dynamic gait overground    PT Home Exercise Plan  Access Code: [email or web address]    Consulted and Agree with Plan of Care   Patient       Patient will benefit from skilled therapeutic intervention in order to improve the following deficits and impairments:  Abnormal gait, Difficulty walking, Decreased balance, Decreased mobility, Decreased strength, Decreased knowledge of use of DME, Decreased endurance, Decreased activity tolerance, Decreased range of motion, Pain, Increased muscle spasms  Visit Diagnosis: Other abnormalities of gait and mobility  Muscle weakness (generalized)     Problem List Patient Active Problem List   Diagnosis Date Noted  . Essential hypertension 05/25/2019  . 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 Acute blood loss anemia   . Steroid-induced hyperglycemia   . Acute on chronic anemia   . Seizures ([place]Atl[place]   . Spastic hemiparesis (HCitrus Park   . ANCA-associated vasculitis ([place]   . Hypertension   . Thrombocytopenia (HD[place]   . Acute unilateral cerebral infarction in a watershed distribution ([facility] 01/15/2019  . Weakness   . CVA (cerebral vascular accident) ([place] 01/12/2019  . Malnutrition of moderate degree 07/31/2018  . Elevated serum protein level   . Multiple myeloma (HChat[place]   . Pancytopenia (H[place]   . Normocytic normochromic anemia 07/16/2018  . Acute kidney failure ([place] 07/16/2018  . NICM (nonischemic cardiomyopathy) ([place] 11/12/2013  . History of ETOH abuse 11/12/2013  . Chronic combined systolic and diastolic CHF (congestive heart failure) (HL[place] 11/12/2013  . Congestive dilated cardiomyopathy (HL[place] 11/04/2013  . Malignant hypertension     EElecta Sniff PT, DPT, NCS 09/07/2019, 6:13 PM  [place]  [place], [address] Phone: [phone]   Fax:  [phone]  Name: Jeremy Grant MRN: [id number] Date of Birth: 1960-05-23

## 2019-09-11 ENCOUNTER — Ambulatory Visit: Payer: BC Managed Care – PPO

## 2019-09-14 ENCOUNTER — Other Ambulatory Visit: Payer: Self-pay

## 2019-09-14 ENCOUNTER — Ambulatory Visit: Payer: 59 | Attending: Physical Medicine & Rehabilitation

## 2019-09-14 DIAGNOSIS — I69354 Hemiplegia and hemiparesis following cerebral infarction affecting left non-dominant side: Secondary | ICD-10-CM | POA: Insufficient documentation

## 2019-09-14 DIAGNOSIS — M6281 Muscle weakness (generalized): Secondary | ICD-10-CM | POA: Insufficient documentation

## 2019-09-14 DIAGNOSIS — R2689 Other abnormalities of gait and mobility: Secondary | ICD-10-CM | POA: Diagnosis present

## 2019-09-14 NOTE — Therapy (Signed)
[facility] [address] [place], [place], [address] Phone: [phone]   Fax:  [phone]  Physical Therapy Treatment  Patient Details  Name: EDWORD CU MRN: [id number] Date of Birth: July 29, 1960 Referring Provider (PT): Dr. Letta Pate   Encounter Date: 09/14/2019  PT End of Session - 09/14/19 0936    Visit Number  76    Number of Visits  61    Date for PT Re-Evaluation  97/98/92   cert 90 days but poc 60 days   Authorization Type  [employer] (Pt has recent change in insurance)    PT Start Time  [id number]    PT Stop Time  1015    PT Time Calculation (min)  41 min    Equipment Utilized During Treatment  Gait belt   left AFO   Activity Tolerance  Patient tolerated treatment well    Behavior During Therapy  [facility] for tasks assessed/performed       Past Medical History:  Diagnosis Date  . Arthritis   . CHF (congestive heart failure) ([place])   . Combined systolic and diastolic cardiac dysfunction    Echo 11/03/2013 EF 17%, grade 3 diastolic dysfunction  . Hypertension   . NICM (nonischemic cardiomyopathy) ([place])    a. L/RHC (11/05/13): RA: 3, RV 52/5, PA 49/19 (31), PCWP 10, AO 166/93, PA 67%, Fick CO/CI: 5.71/2.97, Lmain: normal, LAD: large, without signficant dz, first diagonal has 20% dz at ostium, LCx: normal, RCA: 30% stenosis at the bifurcation of PDA and PLOM    Past Surgical History:  Procedure Laterality Date  . BIOPSY  01/31/2019   Procedure: BIOPSY;  Surgeon: Otis Brace, MD;  Location: [facility] ENDOSCOPY;  Service: Gastroenterology;;  . ESOPHAGOGASTRODUODENOSCOPY N/A 01/31/2019   Procedure: ESOPHAGOGASTRODUODENOSCOPY (EGD);  Surgeon: Otis Brace, MD;  Location: [facility] ENDOSCOPY;  Service: Gastroenterology;  Laterality: N/A;  . IR FLUORO GUIDE CV LINE RIGHT  07/25/2018  . IR US GUIDE VASC ACCESS RIGHT  07/25/2018  . LEFT AND RIGHT HEART CATHETERIZATION WITH CORONARY ANGIOGRAM N/A 11/05/2013   Procedure: LEFT  AND RIGHT HEART CATHETERIZATION WITH CORONARY ANGIOGRAM;  Surgeon: Peter M Martinique, MD;  Location: [facility] CATH LAB;  Service: Cardiovascular;  Laterality: N/A;  . RIGHT HEART CATHETERIZATION N/A 01/14/2014   Procedure: RIGHT HEART CATH;  Surgeon: Larey Dresser, MD;  Location: [facility] CATH LAB;  Service: Cardiovascular;  Laterality: N/A;    There were no vitals filed for this visit.  Subjective Assessment - 09/14/19 0936    Subjective  Pt reports he has been doing well. Mowed the grass yesterday and did some other stuff outside.    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 8/31-9/3 then inpatient rehab 9/3-9/23.PMH: CHF, HTN, hyperlipidemia, glomerulonephritis, pancytopenia/chronic anemia, seizure left-sided post stroke    Patient Stated Goals  Pt wants to be able to walk again.    Currently in Pain?  No/denies    Pain Onset  In the past 7 days                       [facility] Adult PT Treatment/Exercise - 09/14/19 0937      Ambulation/Gait   Ambulation/Gait  Yes    Ambulation/Gait Assistance  5: Supervision    Ambulation/Gait Assistance Details  Verbal cues to increase pelvic rotation    Ambulation Distance (Feet)  345 Feet    Assistive device  None   left  AFO   Gait Pattern  Step-through pattern    Ambulation Surface  Level;Indoor    Gait Comments  Pt ambulated on treadmill 10 min x 2 at 1.69mh with 2 min warm up with 1 UE support then no UE support. PT provided verbal cues to increase left step length and then manual cues at left pelvis to try to get more anterior rotation briefly during first bout. During second bout pt ambulated with PT providing tactile cues at bilateral ASIS with belt to facilitate pelvic rotation. Pt with improved step length on left              PT Education - 09/14/19 1849    Education Details  Pt to continue with current HEP    Person(s) Educated  Patient     Methods  Explanation    Comprehension  Verbalized understanding       PT Short Term Goals - 08/28/19 0939      PT SHORT TERM GOAL #1   Title  Pt will be independent with progressive HEP for strengthening, balance and gait.    Baseline  continue to progress HEP at this time    Time  4    Period  Weeks    Status  On-going    Target Date  08/30/19      PT SHORT TERM GOAL #2   Title  Pt will ambulate >500' on varied surfaces with SPC mod I for improved community mobility.    Baseline  Ambulated 650' on varied surfaces (grass, gravel, unlevel pacement) with SPC, Mod I for all, supv for gravel.    Time  4    Period  Weeks    Status  Partially Met    Target Date  08/30/19      PT SHORT TERM GOAL #3   Title  Pt will be able to perform sit to stand without UE support from standard chair consistently for improved functional strength.    Baseline  completed 5 reps from standard chair, continues to perform inconsistently with hands in lap relying on momentum    Time  4    Period  Weeks    Status  On-going        PT Long Term Goals - 07/31/19 1042      PT LONG TERM GOAL #1   Title  Pt will ambulate >1000' without AD  on varied surfaces mod I for improved community mobility.    Time  8    Period  Weeks    Status  New    Target Date  09/29/19      PT LONG TERM GOAL #2   Title  Pt will increase FGA from 18/30 to >22/30    Baseline  18/30 on 07/31/19    Time  8    Period  Weeks    Status  New    Target Date  09/29/19      PT LONG TERM GOAL #3   Title  Pt will ambulate up/down 4 steps in reciprocal pattern without railing independently for improved functional strength and balance.    Time  8    Period  Weeks    Status  New    Target Date  09/29/19      PT LONG TERM GOAL #4   Title  Pt will increase gait speed to 1.0 m/s or higher for safe community ambulator speed without AD.    Baseline  without AD=0.832m    Time  8    Period  Weeks    Status  New    Target Date   09/29/19            Plan - 09/14/19 1849    Clinical Impression Statement  Pt was able to show improved pelvic rotation with larger left step length after practice in BWS over treadmill. Pt continues to show improved stamina increasing gait time on treadmil.    Personal Factors and Comorbidities  Comorbidity 3+    Comorbidities  CHF, HTN, hyperlipidemia, glomerulonephritis, pancytopenia/chronic anemia, seizure    Examination-Activity Limitations  Stairs;Stand;Squat;Locomotion Level;Transfers;Bed Mobility    Examination-Participation Restrictions  Community Activity;Shop;Driving;Cleaning;Yard Work    [employer]chant [employer] officer  Evolving/Moderate complexity    Rehab Potential  Good    PT Frequency  2x / week    PT Duration  8 weeks    PT Treatment/Interventions  DME Instruction;ADLs/Self Care Home Management;Gait training;Stair training;Functional mobility training;Therapeutic activities;Therapeutic exercise;Balance training;Orthotic Fit/Training;Patient/family education;Neuromuscular re-education;Wheelchair mobility training;Passive range of motion;Manual techniques    PT Next Visit Plan  Complete BWS over treadmill to allow for decreased UE support. Resisted gait, and add in more dual task activities as well. Continue dynamic balance activities and dynamic gait overground    PT Home Exercise Plan  Access Code: [email or web address]    Consulted and Agree with Plan of Care  Patient       Patient will benefit from skilled therapeutic intervention in order to improve the following deficits and impairments:  Abnormal gait, Difficulty walking, Decreased balance, Decreased mobility, Decreased strength, Decreased knowledge of use of DME, Decreased endurance, Decreased activity tolerance, Decreased range of motion, Pain, Increased muscle spasms  Visit Diagnosis: Other abnormalities of gait and mobility  Muscle weakness (generalized)     Problem List Patient Active Problem List   Diagnosis  Date Noted  . Essential hypertension 05/25/2019  . 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 Acute blood loss anemia   . Steroid-induced hyperglycemia   . Acute on chronic anemia   . Seizures ([place])   . Spastic hemiparesis ([place])   . 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 Hypertension   . Thrombocytopenia ([place])   . Acute unilateral cerebral infarction in a watershed distribution [facility]) 01/15/2019  . Weakness   . CVA (cerebral vascular accident) ([place]) 01/12/2019  . Malnutrition of moderate degree 07/31/2018  . Elevated serum protein level   . Multiple myeloma ([place])   . Pancytopenia ([place])   . Normocytic normochromic anemia 07/16/2018  . Acute kidney failure ([place]) 07/16/2018  . NICM (nonischemic cardiomyopathy) ([place]) 11/12/2013  . History of ETOH abuse 11/12/2013  . Chronic combined systolic and diastolic CHF (congestive heart failure) ([place]) 11/12/2013  . Congestive dilated cardiomyopathy ([place]) 11/04/2013  . Malignant hypertension     Electa Sniff, PT, DPT, NCS 09/14/2019, 6:51 PM  [facility] [address] [place], [place], [address] Phone: [phone]   Fax:  [phone]  Name: AVEER BARTOW MRN: [id number] Date of Birth: February 26, 1961

## 2019-09-18 ENCOUNTER — Ambulatory Visit: Payer: BC Managed Care – PPO

## 2019-09-21 ENCOUNTER — Ambulatory Visit: Payer: 59

## 2019-09-21 ENCOUNTER — Other Ambulatory Visit: Payer: Self-pay

## 2019-09-21 DIAGNOSIS — R2689 Other abnormalities of gait and mobility: Secondary | ICD-10-CM

## 2019-09-21 DIAGNOSIS — M6281 Muscle weakness (generalized): Secondary | ICD-10-CM

## 2019-09-21 NOTE — Therapy (Signed)
[facility] [address] [place], [place], [address] Phone: [phone]   Fax:  [phone]  Physical Therapy Treatment  Patient Details  Name: Jeremy Grant MRN: [id number] Date of Birth: 1961-01-18 Referring Provider (PT): Dr. Letta Pate   Encounter Date: 09/21/2019  PT End of Session - 09/21/19 0934    Visit Number  76    Number of Visits  61    Date for PT Re-Evaluation  13/24/40   cert 90 days but poc 60 days   Authorization Type  [employer] (Pt has recent change in insurance)    PT Start Time  [phone]    PT Stop Time  1015    PT Time Calculation (min)  44 min    Equipment Utilized During Treatment  Gait belt   left AFO   Activity Tolerance  Patient tolerated treatment well    Behavior During Therapy  [facility] for tasks assessed/performed       Past Medical History:  Diagnosis Date  . Arthritis   . CHF (congestive heart failure) ([place])   . Combined systolic and diastolic cardiac dysfunction    Echo 11/03/2013 EF 25%, grade 3 diastolic dysfunction  . Hypertension   . NICM (nonischemic cardiomyopathy) ([place])    a. L/RHC (11/05/13): RA: 3, RV 52/5, PA 49/19 (31), PCWP 10, AO 166/93, PA 67%, Fick CO/CI: 5.71/2.97, Lmain: normal, LAD: large, without signficant dz, first diagonal has 20% dz at ostium, LCx: normal, RCA: 30% stenosis at the bifurcation of PDA and PLOM    Past Surgical History:  Procedure Laterality Date  . BIOPSY  01/31/2019   Procedure: BIOPSY;  Surgeon: Otis Brace, MD;  Location: [facility] ENDOSCOPY;  Service: Gastroenterology;;  . ESOPHAGOGASTRODUODENOSCOPY N/A 01/31/2019   Procedure: ESOPHAGOGASTRODUODENOSCOPY (EGD);  Surgeon: Otis Brace, MD;  Location: [facility] ENDOSCOPY;  Service: Gastroenterology;  Laterality: N/A;  . IR FLUORO GUIDE CV LINE RIGHT  07/25/2018  . IR US GUIDE VASC ACCESS RIGHT  07/25/2018  . LEFT AND RIGHT HEART CATHETERIZATION WITH CORONARY ANGIOGRAM N/A 11/05/2013   Procedure:  LEFT AND RIGHT HEART CATHETERIZATION WITH CORONARY ANGIOGRAM;  Surgeon: Peter M Martinique, MD;  Location: [facility] CATH LAB;  Service: Cardiovascular;  Laterality: N/A;  . RIGHT HEART CATHETERIZATION N/A 01/14/2014   Procedure: RIGHT HEART CATH;  Surgeon: Larey Dresser, MD;  Location: [facility] CATH LAB;  Service: Cardiovascular;  Laterality: N/A;    There were no vitals filed for this visit.  Subjective Assessment - 09/21/19 0933    Subjective  Pt reports that he was able to help his wife with planting the garden this weekend.    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 8/31-9/3 then inpatient rehab 9/3-9/23.PMH: CHF, HTN, hyperlipidemia, glomerulonephritis, pancytopenia/chronic anemia, seizure left-sided post stroke    Patient Stated Goals  Pt wants to be able to walk again.    Currently in Pain?  No/denies    Pain Onset  In the past 7 days                       [facility] Adult PT Treatment/Exercise - 09/21/19 0939      Transfers   Transfers  Sit to Stand;Stand to Sit    Sit to Stand  7: Independent    Five time sit to stand comments   Pt performed x 5 without UE support. Did have to perform a couple attempts when did not  get forward all the way    Stand to Sit  7: Independent      Ambulation/Gait   Ambulation/Gait  Yes    Ambulation/Gait Assistance  6: Modified independent (Device/Increase time)    Ambulation/Gait Assistance Details  Pt was cued to increase step length.    Ambulation Distance (Feet)  550 Feet    Assistive device  Straight cane   left AFO   Gait Pattern  Step-through pattern    Ambulation Surface  Level;Indoor    Gait Comments  Pt ambulated over treadmill in BWS more for safety. Ambulated x 16 min with 2 min intervals of 1.20mh flat and then 1.431m on 3% incline without UE support. Pt was given verbal cues to increase step length on left. BP=182/88 after. Seated rest break x 5 min  and rechecked 170/82.              PT Education - 09/21/19 1444    Education Details  Education on need to recert next visit pending progress when goals checked versus d/c if patient decides to stop. He was going to check with wife.    Person(s) Educated  Patient    Methods  Explanation    Comprehension  Verbalized understanding       PT Short Term Goals - 09/21/19 092330    PT SHORT TERM GOAL #1   Title  Pt will be independent with progressive HEP for strengthening, balance and gait.    Baseline  continue to progress HEP at this time    Time  4    Period  Weeks    Status  On-going    Target Date  08/30/19      PT SHORT TERM GOAL #2   Title  Pt will ambulate >500' on varied surfaces with SPC mod I for improved community mobility.    Baseline  Pt is walking outside on varied surfaces with SPC mod I.    Time  4    Period  Weeks    Status  Achieved    Target Date  08/30/19      PT SHORT TERM GOAL #3   Title  Pt will be able to perform sit to stand without UE support from standard chair consistently for improved functional strength.    Baseline  Pt performed sit to stand x 5 without UE support from standard chair.    Time  4    Period  Weeks    Status  Achieved        PT Long Term Goals - 07/31/19 1042      PT LONG TERM GOAL #1   Title  Pt will ambulate >1000' without AD  on varied surfaces mod I for improved community mobility.    Time  8    Period  Weeks    Status  New    Target Date  09/29/19      PT LONG TERM GOAL #2   Title  Pt will increase FGA from 18/30 to >22/30    Baseline  18/30 on 07/31/19    Time  8    Period  Weeks    Status  New    Target Date  09/29/19      PT LONG TERM GOAL #3   Title  Pt will ambulate up/down 4 steps in reciprocal pattern without railing independently for improved functional strength and balance.    Time  8    Period  Weeks  Status  New    Target Date  09/29/19      PT LONG TERM GOAL #4   Title  Pt will increase  gait speed to 1.0 m/s or higher for safe community ambulator speed without AD.    Baseline  without AD=0.29ms    Time  8    Period  Weeks    Status  New    Target Date  09/29/19            Plan - 09/21/19 1459    Clinical Impression Statement  Pt was able to progress gait on treadmill today with increased speed along with intervals of elevation added in. Pt showed improved step length. Pt met sit to stand goal today as well as gait with SPC on varied surface.    Personal Factors and Comorbidities  Comorbidity 3+    Comorbidities  CHF, HTN, hyperlipidemia, glomerulonephritis, pancytopenia/chronic anemia, seizure    Examination-Activity Limitations  Stairs;Stand;Squat;Locomotion Level;Transfers;Bed Mobility    Examination-Participation Restrictions  Community Activity;Shop;Driving;Cleaning;Yard Work    SMerchant navy officer Evolving/Moderate complexity    Rehab Potential  Good    PT Frequency  2x / week    PT Duration  8 weeks    PT Treatment/Interventions  DME Instruction;ADLs/Self Care Home Management;Gait training;Stair training;Functional mobility training;Therapeutic activities;Therapeutic exercise;Balance training;Orthotic Fit/Training;Patient/family education;Neuromuscular re-education;Wheelchair mobility training;Passive range of motion;Manual techniques    PT Next Visit Plan  Recheck LTGs for possible recert next visit. Complete BWS over treadmill to allow for decreased UE support. Resisted gait, and add in more dual task activities as well. Continue dynamic balance activities and dynamic gait overground    PT Home Exercise Plan  Access Code: [email or web address]    Consulted and Agree with Plan of Care  Patient       Patient will benefit from skilled therapeutic intervention in order to improve the following deficits and impairments:  Abnormal gait, Difficulty walking, Decreased balance, Decreased mobility, Decreased strength, Decreased knowledge of use of DME, Decreased  endurance, Decreased activity tolerance, Decreased range of motion, Pain, Increased muscle spasms  Visit Diagnosis: Other abnormalities of gait and mobility  Muscle weakness (generalized)     Problem List Patient Active Problem List   Diagnosis Date Noted  . Essential hypertension 05/25/2019  . 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 Acute blood loss anemia   . Steroid-induced hyperglycemia   . Acute on chronic anemia   . Seizures ([place]   . Spastic hemiparesis ([place]   . ANCA-associated vasculitis ([place]   . Hypertension   . Thrombocytopenia ([place]   . Acute unilateral cerebral infarction in a watershed distribution ([facility] 01/15/2019  . Weakness   . CVA (cerebral vascular accident) ([place] 01/12/2019  . Malnutrition of moderate degree 07/31/2018  . Elevated serum protein level   . Multiple myeloma ([place]   . Pancytopenia ([place]   . Normocytic normochromic anemia 07/16/2018  . Acute kidney failure ([place] 07/16/2018  . NICM (nonischemic cardiomyopathy) ([place] 11/12/2013  . History of ETOH abuse 11/12/2013  . Chronic combined systolic and diastolic CHF (congestive heart failure) ([place] 11/12/2013  . Congestive dilated cardiomyopathy ([place] 11/04/2013  . Malignant hypertension     EElecta Sniff PT, DPT, NCS 09/21/2019, 3:02 PM  [facility] [address]Phone: [phone]  Fax:  [phone] Name: DPERVIS MACINTYREMRN: [id number] of Birth: 702-11-1960

## 2019-09-28 ENCOUNTER — Other Ambulatory Visit: Payer: Self-pay

## 2019-09-28 ENCOUNTER — Ambulatory Visit: Payer: 59

## 2019-09-28 DIAGNOSIS — R2689 Other abnormalities of gait and mobility: Secondary | ICD-10-CM | POA: Diagnosis not present

## 2019-09-28 DIAGNOSIS — I69354 Hemiplegia and hemiparesis following cerebral infarction affecting left non-dominant side: Secondary | ICD-10-CM

## 2019-09-28 DIAGNOSIS — M6281 Muscle weakness (generalized): Secondary | ICD-10-CM

## 2019-09-28 NOTE — Therapy (Signed)
[facility] [address] [place], [place], [address] Phone: [phone]   Fax:  [phone]  Physical Therapy Treatment/Recert  Patient Details  Name: Jeremy Grant MRN: [id number] Date of Birth: 03/01/1961 Referring Provider (PT): Dr. Letta Pate   Encounter Date: 09/28/2019  PT End of Session - 09/28/19 0934    Visit Number  55    Number of Visits  56    Date for PT Re-Evaluation  63/87/56   cert 90 days but poc 60 days   Authorization Type  [employer] (Pt has recent change in insurance)    PT Start Time  0930    PT Stop Time  1012    PT Time Calculation (min)  42 min    Equipment Utilized During Treatment  Gait belt   left AFO   Activity Tolerance  Patient tolerated treatment well    Behavior During Therapy  [facility] for tasks assessed/performed       Past Medical History:  Diagnosis Date  . Arthritis   . CHF (congestive heart failure) ([place])   . Combined systolic and diastolic cardiac dysfunction    Echo 11/03/2013 EF 43%, grade 3 diastolic dysfunction  . Hypertension   . NICM (nonischemic cardiomyopathy) ([place])    a. L/RHC (11/05/13): RA: 3, RV 52/5, PA 49/19 (31), PCWP 10, AO 166/93, PA 67%, Fick CO/CI: 5.71/2.97, Lmain: normal, LAD: large, without signficant dz, first diagonal has 20% dz at ostium, LCx: normal, RCA: 30% stenosis at the bifurcation of PDA and PLOM    Past Surgical History:  Procedure Laterality Date  . BIOPSY  01/31/2019   Procedure: BIOPSY;  Surgeon: Otis Brace, MD;  Location: [place] ENDOSCOPY;  Service: Gastroenterology;;  . ESOPHAGOGASTRODUODENOSCOPY N/A 01/31/2019   Procedure: ESOPHAGOGASTRODUODENOSCOPY (EGD);  Surgeon: Otis Brace, MD;  Location[facility] ENDOSCOPY;  Service: Gastroenterology;  Laterality: N/A;  . IR FLUORO GUIDE CV LINE RIGHT  07/25/2018  . IR US GUIDE VASC ACCESS RIGHT  07/25/2018  . LEFT AND RIGHT HEART CATHETERIZATION WITH CORONARY ANGIOGRAM N/A 11/05/2013   Procedure: LEFT AND RIGHT HEART CATHETERIZATION WITH CORONARY ANGIOGRAM;  Surgeon: Peter M Martinique, MD;  Location: [facility] CATH LAB;  Service: Cardiovascular;  Laterality: N/A;  . RIGHT HEART CATHETERIZATION N/A 01/14/2014   Procedure: RIGHT HEART CATH;  Surgeon: Larey Dresser, MD;  Location: [facility] CATH LAB;  Service: Cardiovascular;  Laterality: N/A;    There were no vitals filed for this visit.  Subjective Assessment - 09/28/19 0933    Subjective  Pt reports that he is doing well. Continues to do more work outside in yard. He was sore after mowing on rider. He would like to continue therapy if possible. 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 8/31-9/3 then inpatient rehab 9/3-9/23.PMH: CHF, HTN, hyperlipidemia, glomerulonephritis, pancytopenia/chronic anemia, seizure left-sided post stroke    Patient Stated Goals  Pt wants to be able to walk again.    Currently in Pain?  No/denies    Pain Onset  In the past 7 days         [facility] PT Assessment - 09/28/19 0936      Functional Gait  Assessment   Gait assessed   Yes    Gait Level Surface  Walks 20 ft in less than 7 sec but greater than 5.5 sec, uses assistive device, slower speed, mild gait deviations, or  deviates 6-10 in outside of the 12 in walkway width.    Change in Gait Speed  Able to change speed, demonstrates mild gait deviations, deviates 6-10 in outside of the 12 in walkway width, or no gait deviations, unable to achieve a major change in velocity, or uses a change in velocity, or uses an assistive device.    Gait with Horizontal Head Turns  Performs head turns smoothly with no change in gait. Deviates no more than 6 in outside 12 in walkway width    Gait with Vertical Head Turns  Performs head turns with no change in gait. Deviates no more than 6 in outside 12 in walkway width.    Gait and Pivot Turn   Pivot turns safely within 3 sec and stops quickly with no loss of balance.    Step Over Obstacle  Is able to step over one shoe box (4.5 in total height) but must slow down and adjust steps to clear box safely. May require verbal cueing.    Gait with Narrow Base of Support  Ambulates less than 4 steps heel to toe or cannot perform without assistance.    Gait with Eyes Closed  Walks 20 ft, slow speed, abnormal gait pattern, evidence for imbalance, deviates 10-15 in outside 12 in walkway width. Requires more than 9 sec to ambulate 20 ft.    Ambulating Backwards  Walks 20 ft, uses assistive device, slower speed, mild gait deviations, deviates 6-10 in outside 12 in walkway width.    Steps  Alternating feet, must use rail.    Total Score  19                    [facility] Adult PT Treatment/Exercise - 09/28/19 0936      Transfers   Transfers  Sit to Stand;Stand to Sit    Sit to Stand  7: Independent    Stand to Sit  7: Independent      Ambulation/Gait   Ambulation/Gait  Yes    Ambulation/Gait Assistance  5: Supervision;6: Modified independent (Device/Increase time)    Ambulation/Gait Assistance Details  Pt ambulated independently on level surfaces outside. Supervision on grass on incline and on mulch. Pt was cued to increase step length as he went.    Ambulation Distance (Feet)  1500 Feet    Assistive device  --   right AFO   Gait Pattern  Step-through pattern;Decreased hip/knee flexion - left;Decreased stance time - left    Ambulation Surface  Level;Unlevel;Outdoor;Paved;Grass;Other (comment)   mulch   Gait velocity  8.82 sec=1.38ms    Curb  5: Supervision      High Level Balance   High Level Balance Comments  SLS 1 sec each leg, tandem stance with left foot in front 30 sec after had support to get in position, 5 sec with right foot in front             PT Education - 09/28/19 1949    Education Details  Discussed results of testing and plan to recert.    Person(s)  Educated  Patient    Methods  Explanation    Comprehension  Verbalized understanding       PT Short Term Goals - 09/28/19 0934      PT SHORT TERM GOAL #1   Title  Pt will be independent with progressive HEP for strengthening, balance and gait.    Baseline  Pt reports that he continues to work on exercises but does depend how  tired he is from doing other activities around the house.    Time  4    Period  Weeks    Status  Achieved    Target Date  08/30/19      PT SHORT TERM GOAL #2   Title  Pt will ambulate >500' on varied surfaces with SPC mod I for improved community mobility.    Baseline  Pt is walking outside on varied surfaces with SPC mod I.    Time  4    Period  Weeks    Status  Achieved    Target Date  08/30/19      PT SHORT TERM GOAL #3   Title  Pt will be able to perform sit to stand without UE support from standard chair consistently for improved functional strength.    Baseline  Pt performed sit to stand x 5 without UE support from standard chair.    Time  4    Period  Weeks    Status  Achieved        PT Long Term Goals - 09/28/19 0947      PT LONG TERM GOAL #1   Title  Pt will ambulate >1000' without AD  on varied surfaces mod I for improved community mobility.    Baseline  mod I on level but supervision on nonlevel without AD with left AFO.    Time  8    Period  Weeks    Status  Partially Met      PT LONG TERM GOAL #2   Title  Pt will increase FGA from 18/30 to >22/30    Baseline  18/30 on 07/31/19, 19/30 on 09/28/19    Time  8    Period  Weeks    Status  On-going      PT LONG TERM GOAL #3   Title  Pt will ambulate up/down 4 steps in reciprocal pattern without railing independently for improved functional strength and balance.    Baseline  able to perform reciprocal pattern on steps with 1 railing.    Time  8    Period  Weeks    Status  Partially Met      PT LONG TERM GOAL #4   Title  Pt will increase gait speed to 1.0 m/s or higher for safe  community ambulator speed without AD.    Baseline  without AD=0.98ms, 1.156m without AD    Time  8    Period  Weeks    Status  Achieved       Updated goals: PT Short Term Goals - 09/28/19 1956      PT SHORT TERM GOAL #1   Title  Pt will be able to perform SLS x 5 sec each leg for improved balance and strength.    Baseline  1 sec    Time  4    Period  Weeks    Status  New    Target Date  10/28/19      PT SHORT TERM GOAL #2   Title  Pt will ambulate 500' on level indoor surfaces without AFO for improved gait and strength independently.    Time  4    Period  Weeks    Status  New    Target Date  10/28/19      PT Long Term Goals - 09/28/19 1959      PT LONG TERM GOAL #1   Title  Pt will ambulate >1000' without AD  on  varied surfaces mod I for improved community mobility.    Baseline  mod I on level but supervision on nonlevel without AD with left AFO.    Time  8    Period  Weeks    Status  On-going    Target Date  11/27/19      PT LONG TERM GOAL #2   Title  Pt will increase FGA from 18/30 to >22/30    Baseline  18/30 on 07/31/19, 19/30 on 09/28/19    Time  8    Period  Weeks    Status  On-going    Target Date  11/27/19      PT LONG TERM GOAL #3   Title  Pt will ambulate up/down 4 steps in reciprocal pattern without railing independently for improved functional strength and balance.    Baseline  able to perform reciprocal pattern on steps with 1 railing.    Time  8    Period  Weeks    Status  On-going    Target Date  11/27/19      PT LONG TERM GOAL #4   Title  Pt will increase gait speed to 1.2 m/s or higher for normal, safe community ambulator speed without AD.    Baseline  without AD=0.74ms, 1.130m without AD on 09/28/19    Time  8    Period  Weeks    Status  New    Target Date  11/27/19           Plan - 09/28/19 1951    Clinical Impression Statement  Pt continues to show progress with gait, balance and strength. He met gait speed goal today showing  improving gait safety. He is just short of normal community ambulator speed. Pt is now mod I on level surfaces without AD with left AFO and supervision on nonlevel. He improved slightly on FGA to 19/30 and is moderate fall risk based on this. Pt will benefit from continued PT to continue to progress gait, strength and balance to maximize independence and function.    Personal Factors and Comorbidities  Comorbidity 3+    Comorbidities  CHF, HTN, hyperlipidemia, glomerulonephritis, pancytopenia/chronic anemia, seizure    Examination-Activity Limitations  Stairs;Stand;Squat;Locomotion Level;Transfers;Bed Mobility    Examination-Participation Restrictions  Community Activity;Shop;Driving;Cleaning;Yard Work    StMerchant navy officerEvolving/Moderate complexity    Rehab Potential  Good    PT Frequency  1x / week    PT Duration  8 weeks    PT Treatment/Interventions  DME Instruction;ADLs/Self Care Home Management;Gait training;Stair training;Functional mobility training;Therapeutic activities;Therapeutic exercise;Balance training;Orthotic Fit/Training;Patient/family education;Neuromuscular re-education;Wheelchair mobility training;Passive range of motion;Manual techniques    PT Next Visit Plan  Complete BWS over treadmill to allow for decreased UE support. Add incline to get more hamstring activation. Gait on level surfaces without AFO. Resisted gait, and add in more dual task activities as well. Continue dynamic balance activities and dynamic gait overground    PT Home Exercise Plan  Access Code: [email or web address]    Consulted and Agree with Plan of Care  Patient       Patient will benefit from skilled therapeutic intervention in order to improve the following deficits and impairments:  Abnormal gait, Difficulty walking, Decreased balance, Decreased mobility, Decreased strength, Decreased knowledge of use of DME, Decreased endurance, Decreased activity tolerance, Decreased range of motion, Pain,  Increased muscle spasms  Visit Diagnosis: Other abnormalities of gait and mobility  Muscle weakness (generalized)  Hemiplegia and hemiparesis following cerebral infarction affecting left  non-dominant side [facility])     Problem List Patient Active Problem List   Diagnosis Date Noted  . Essential hypertension 05/25/2019  . 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 Acute blood loss anemia   . Steroid-induced hyperglycemia   . Acute on chronic anemia   . Seizures ([place])   . Spastic hemiparesis ([place])   . 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 Hypertension   . Thrombocytopenia ([place])   . Acute unilateral cerebral infarction in a watershed distribution [facility]) 01/15/2019  . Weakness   . CVA (cerebral vascular accident) ([place]) 01/12/2019  . Malnutrition of moderate degree 07/31/2018  . Elevated serum protein level   . Multiple myeloma ([place])   . Pancytopenia ([place])   . Normocytic normochromic anemia 07/16/2018  . Acute kidney failure ([place]) 07/16/2018  . NICM (nonischemic cardiomyopathy) ([place]) 11/12/2013  . History of ETOH abuse 11/12/2013  . Chronic combined systolic and diastolic CHF (congestive heart failure) ([place]) 11/12/2013  . Congestive dilated cardiomyopathy ([place]) 11/04/2013  . Malignant hypertension     Electa Sniff, PT, DPT, NCS 09/28/2019, 7:56 PM  [facility] [address] [place], [place], [address] Phone: [phone]   Fax:  [phone]  Name: Jeremy Grant MRN: [id number] Date of Birth: [birth year]

## 2019-10-05 ENCOUNTER — Ambulatory Visit: Payer: BC Managed Care – PPO

## 2019-10-08 ENCOUNTER — Ambulatory Visit: Payer: 59

## 2019-10-09 ENCOUNTER — Other Ambulatory Visit: Payer: Self-pay

## 2019-10-09 ENCOUNTER — Ambulatory Visit: Payer: 59

## 2019-10-09 DIAGNOSIS — R2689 Other abnormalities of gait and mobility: Secondary | ICD-10-CM | POA: Diagnosis not present

## 2019-10-09 DIAGNOSIS — I69354 Hemiplegia and hemiparesis following cerebral infarction affecting left non-dominant side: Secondary | ICD-10-CM

## 2019-10-09 DIAGNOSIS — M6281 Muscle weakness (generalized): Secondary | ICD-10-CM

## 2019-10-09 NOTE — Therapy (Signed)
[facility] [address] [place], [place], [address] Phone: [phone]   Fax:  [phone]  Physical Therapy Treatment  Patient Details  Name: Jeremy Grant MRN: [id number] Date of Birth: [birth year] Referring Provider (PT): Dr. Letta Pate   Encounter Date: 10/09/2019  PT End of Session - 10/09/19 1022    Visit Number  56    Number of Visits  64    Date for PT Re-Evaluation  91/91/66   cert 90 days but poc 60 days   Authorization Type  [employer] (Pt has recent change in insurance)    PT Start Time  1020    PT Stop Time  1059    PT Time Calculation (min)  39 min    Equipment Utilized During Treatment  Gait belt   left AFO   Activity Tolerance  Patient tolerated treatment well    Behavior During Therapy  WFL for tasks assessed/performed       Past Medical History:  Diagnosis Date  . Arthritis   . CHF (congestive heart failure) ([place])   . Combined systolic and diastolic cardiac dysfunction    Echo 11/03/2013 EF 06%, grade 3 diastolic dysfunction  . Hypertension   . NICM (nonischemic cardiomyopathy) ([place])    a. L/RHC (11/05/13): RA: 3, RV 52/5, PA 49/19 (31), PCWP 10, AO 166/93, PA 67%, Fick CO/CI: 5.71/2.97, Lmain: normal, LAD: large, without signficant dz, first diagonal has 20% dz at ostium, LCx: normal, RCA: 30% stenosis at the bifurcation of PDA and PLOM    Past Surgical History:  Procedure Laterality Date  . BIOPSY  01/31/2019   Procedure: BIOPSY;  Surgeon: Otis Brace, MD;  Location: [facility] ENDOSCOPY;  Service: Gastroenterology;;  . ESOPHAGOGASTRODUODENOSCOPY N/A 01/31/2019   Procedure: ESOPHAGOGASTRODUODENOSCOPY (EGD);  Surgeon: Otis Brace, MD;  Location[facility] ENDOSCOPY;  Service: Gastroenterology;  Laterality: N/A;  . IR FLUORO GUIDE CV LINE RIGHT  07/25/2018  . IR US GUIDE VASC ACCESS RIGHT  07/25/2018  . LEFT AND RIGHT HEART CATHETERIZATION WITH CORONARY ANGIOGRAM N/A 11/05/2013   Procedure:  LEFT AND RIGHT HEART CATHETERIZATION WITH CORONARY ANGIOGRAM;  Surgeon: Peter M Martinique, MD;  Location: [facility] CATH LAB;  Service: Cardiovascular;  Laterality: N/A;  . RIGHT HEART CATHETERIZATION N/A 01/14/2014   Procedure: RIGHT HEART CATH;  Surgeon: Larey Dresser, MD;  Location: [facility] CATH LAB;  Service: Cardiovascular;  Laterality: N/A;    There were no vitals filed for this visit.  Subjective Assessment - 10/09/19 1022    Subjective  Pt reports he is doing well. He saw kidney doctor  yesterday and went well. No changes. Was able to till the garden the other day. Has gone fishing. Has also been showing by himself.    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 8/31-9/3 then inpatient rehab 9/3-9/23.PMH: CHF, HTN, hyperlipidemia, glomerulonephritis, pancytopenia/chronic anemia, seizure left-sided post stroke    Patient Stated Goals  Pt wants to be able to walk again.    Currently in Pain?  No/denies    Pain Onset  In the past 7 days                        [facility] Adult PT Treatment/Exercise - 10/09/19 1023      Ambulation/Gait   Ambulation/Gait  Yes    Ambulation/Gait Assistance  5: Supervision;4: Min guard    Ambulation/Gait Assistance Details  PT provided pertubations throughout  gait. Pt was able to recover without PT having to physically assist.    Ambulation Distance (Feet)  460 Feet    Assistive device  None   left AFO   Gait Pattern  Step-through pattern    Ambulation Surface  Level;Indoor    Gait Comments  20 min at 1.45mh to warm up with UE support and cool down 2 min at end, then 1.668m without UE support other than 1.4 for 3 min on 3% incline. Pt was given verbal cues at times to increase step length bilateral and be sure to tighten gluts on left during stance. Pt was in BWS for safety with minimal to no unweighting. BP=130/84               PT Short Term Goals - 09/28/19 1956       PT SHORT TERM GOAL #1   Title  Pt will be able to perform SLS x 5 sec each leg for improved balance and strength.    Baseline  1 sec    Time  4    Period  Weeks    Status  New    Target Date  10/28/19      PT SHORT TERM GOAL #2   Title  Pt will ambulate 500' on level indoor surfaces without AFO for improved gait and strength independently.    Time  4    Period  Weeks    Status  New    Target Date  10/28/19        PT Long Term Goals - 09/28/19 1959      PT LONG TERM GOAL #1   Title  Pt will ambulate >1000' without AD  on varied surfaces mod I for improved community mobility.    Baseline  mod I on level but supervision on nonlevel without AD with left AFO.    Time  8    Period  Weeks    Status  On-going    Target Date  11/27/19      PT LONG TERM GOAL #2   Title  Pt will increase FGA from 18/30 to >22/30    Baseline  18/30 on 07/31/19, 19/30 on 09/28/19    Time  8    Period  Weeks    Status  On-going    Target Date  11/27/19      PT LONG TERM GOAL #3   Title  Pt will ambulate up/down 4 steps in reciprocal pattern without railing independently for improved functional strength and balance.    Baseline  able to perform reciprocal pattern on steps with 1 railing.    Time  8    Period  Weeks    Status  On-going    Target Date  11/27/19      PT LONG TERM GOAL #4   Title  Pt will increase gait speed to 1.2 m/s or higher for normal, safe community ambulator speed without AD.    Baseline  without AD=0.8657m 1.8m78mithout AD on 09/28/19    Time  8    Period  Weeks    Status  New    Target Date  11/27/19            Plan - 10/09/19 1123    Clinical Impression Statement  Pt was able to progress gait over treadmill today to 20 min and BP did great. He demonstrated improved step length bilateral throughout even when adding in incline.    Personal Factors and Comorbidities  Comorbidity 3+    Comorbidities  CHF, HTN, hyperlipidemia, glomerulonephritis,  pancytopenia/chronic anemia, seizure    Examination-Activity Limitations  Stairs;Stand;Squat;Locomotion Level;Transfers;Bed Mobility    Examination-Participation Restrictions  Community Activity;Shop;Driving;Cleaning;Yard Work    Merchant navy officer  Evolving/Moderate complexity    Rehab Potential  Good    PT Frequency  1x / week    PT Duration  8 weeks    PT Treatment/Interventions  DME Instruction;ADLs/Self Care Home Management;Gait training;Stair training;Functional mobility training;Therapeutic activities;Therapeutic exercise;Balance training;Orthotic Fit/Training;Patient/family education;Neuromuscular re-education;Wheelchair mobility training;Passive range of motion;Manual techniques    PT Next Visit Plan  Continue BWS over treadmill to allow for decreased UE support. Add incline to get more hamstring activation. Gait on level surfaces without AFO. Resisted gait, and add in more dual task activities as well. Continue dynamic balance activities and dynamic gait overground    PT Home Exercise Plan  Access Code: [email or web address]    Consulted and Agree with Plan of Care  Patient       Patient will benefit from skilled therapeutic intervention in order to improve the following deficits and impairments:  Abnormal gait, Difficulty walking, Decreased balance, Decreased mobility, Decreased strength, Decreased knowledge of use of DME, Decreased endurance, Decreased activity tolerance, Decreased range of motion, Pain, Increased muscle spasms  Visit Diagnosis: Other abnormalities of gait and mobility  Hemiplegia and hemiparesis following cerebral infarction affecting left non-dominant side (HCC)  Muscle weakness (generalized)     Problem List Patient Active Problem List   Diagnosis Date Noted  . Essential hypertension 05/25/2019  . 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 Acute blood loss anemia   . Steroid-induced hyperglycemia   . Acute on chronic anemia   . Seizures ([place])   . Spastic hemiparesis  ([place])   . 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 Hypertension   . Thrombocytopenia ([place])   . Acute unilateral cerebral infarction in a watershed distribution [facility]) 01/15/2019  . Weakness   . CVA (cerebral vascular accident) ([place]) 01/12/2019  . Malnutrition of moderate degree 07/31/2018  . Elevated serum protein level   . Multiple myeloma ([place])   . Pancytopenia ([place])   . Normocytic normochromic anemia 07/16/2018  . Acute kidney failure ([place]) 07/16/2018  . NICM (nonischemic cardiomyopathy) ([place]) 11/12/2013  . History of ETOH abuse 11/12/2013  . Chronic combined systolic and diastolic CHF (congestive heart failure) ([place]) 11/12/2013  . Congestive dilated cardiomyopathy ([place]) 11/04/2013  . Malignant hypertension     Electa Sniff, PT, DPT, NCS 10/09/2019, 11:26 AM  [facility] [address] [place], [place], [address] Phone: [phone]   Fax:  [phone]  Name: Jeremy Grant MRN: [id number] Date of Birth: 30-Aug-1960

## 2019-10-13 ENCOUNTER — Ambulatory Visit: Payer: BC Managed Care – PPO

## 2019-10-15 ENCOUNTER — Other Ambulatory Visit: Payer: Self-pay

## 2019-10-15 ENCOUNTER — Ambulatory Visit: Payer: 59 | Attending: Physical Medicine & Rehabilitation

## 2019-10-15 DIAGNOSIS — I69354 Hemiplegia and hemiparesis following cerebral infarction affecting left non-dominant side: Secondary | ICD-10-CM | POA: Diagnosis present

## 2019-10-15 DIAGNOSIS — R2689 Other abnormalities of gait and mobility: Secondary | ICD-10-CM | POA: Insufficient documentation

## 2019-10-15 DIAGNOSIS — M6281 Muscle weakness (generalized): Secondary | ICD-10-CM | POA: Diagnosis present

## 2019-10-15 NOTE — Therapy (Signed)
[place] [address] [place], [place], [address] Phone: [phone]   Fax:  [phone]  Physical Therapy Treatment  Patient Details  Name: Jeremy Grant MRN: [id number] Date of Birth: [birth year] Referring Provider (PT): Dr. Letta Pate   Encounter Date: 10/15/2019  PT End of Session - 10/15/19 0801    Visit Number  7    Number of Visits  54    Date for PT Re-Evaluation  31/54/00   cert 90 days but poc 60 days   Authorization Type  [employer] [employer] (Pt has recent change in insurance)    PT Start Time  0800    PT Stop Time  0845    PT Time Calculation (min)  45 min    Equipment Utilized During Treatment  Gait belt   left AFO   Activity Tolerance  Patient tolerated treatment well    Behavior During Therapy  [facility] for tasks assessed/performed       Past Medical History:  Diagnosis Date  . Arthritis   . CHF (congestive heart failure) ([place])   . Combined systolic and diastolic cardiac dysfunction    Echo 11/03/2013 EF 86%, grade 3 diastolic dysfunction  . Hypertension   . NICM (nonischemic cardiomyopathy) ([place])    a. L/RHC (11/05/13): RA: 3, RV 52/5, PA 49/19 (31), PCWP 10, AO 166/93, PA 67%, Fick CO/CI: 5.71/2.97, Lmain: normal, LAD: large, without signficant dz, first diagonal has 20% dz at ostium, LCx: normal, RCA: 30% stenosis at the bifurcation of PDA and PLOM    Past Surgical History:  Procedure Laterality Date  . BIOPSY  01/31/2019   Procedure: BIOPSY;  Surgeon: Otis Brace, MD;  Location: [facility] ENDOSCOPY;  Service: Gastroenterology;;  . ESOPHAGOGASTRODUODENOSCOPY N/A 01/31/2019   Procedure: ESOPHAGOGASTRODUODENOSCOPY (EGD);  Surgeon: Otis Brace, MD;  Location: [facility] ENDOSCOPY;  Service: Gastroenterology;  Laterality: N/A;  . IR FLUORO GUIDE CV LINE RIGHT  07/25/2018  . IR US GUIDE VASC ACCESS RIGHT  07/25/2018  . LEFT AND RIGHT HEART CATHETERIZATION WITH CORONARY ANGIOGRAM N/A 11/05/2013   Procedure: LEFT  AND RIGHT HEART CATHETERIZATION WITH CORONARY ANGIOGRAM;  Surgeon: Peter M Martinique, MD;  Location: [facility] CATH LAB;  Service: Cardiovascular;  Laterality: N/A;  . RIGHT HEART CATHETERIZATION N/A 01/14/2014   Procedure: RIGHT HEART CATH;  Surgeon: Larey Dresser, MD;  Location: [facility] CATH LAB;  Service: Cardiovascular;  Laterality: N/A;    There were no vitals filed for this visit.  Subjective Assessment - 10/15/19 0801    Subjective  Pt reports that he is doing well. No changes.    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 8/31-9/3 then inpatient rehab 9/3-9/23.PMH: CHF, HTN, hyperlipidemia, glomerulonephritis, pancytopenia/chronic anemia, seizure left-sided post stroke    Patient Stated Goals  Pt wants to be able to walk again.    Currently in Pain?  No/denies    Pain Onset  In the past 7 days                        [facility] Adult PT Treatment/Exercise - 10/15/19 0802      Ambulation/Gait   Ambulation/Gait  Yes    Ambulation/Gait Assistance  5: Supervision    Ambulation/Gait Assistance Details  Pt utilized left AFO first bout then removed for 2nd bout. Pt was cued to focus on heel strike with left foot.    Ambulation Distance (Feet)  345 Feet  68' without AFO   Assistive device  None    Gait Pattern  Step-through pattern    Ambulation Surface  Level;Indoor    Gait Comments  At end of sessoin BWS over treadmill x 8 min with 2 min warm up at 1.12mh then 1.874m other than 1 min cool down at 1.63m563m Pt was given verbal cues to increase left step length at times.      Neuro Re-ed    Neuro Re-ed Details   In // bars: Step-ups on 6" step with right LE working on push off with left foot x 10 then stepping over 6" step with right foot to work on left foot clearance when trailing x 10. Rockerboard positioned anterior/posterior with maintaining level x 30 sec eyes open and x 30 sec eyes closed. On  rockerboard tapping cone with light 1 UE support x 20. Reciprocal steps over 6 hurdles of varied height with SPC x 4 laps with CGA . No AFO for all the activities listed.             PT Education - 10/15/19 1313    Education Details  Pt to continue with current HEP    Person(s) Educated  Patient    Methods  Explanation    Comprehension  Verbalized understanding       PT Short Term Goals - 09/28/19 1956      PT SHORT TERM GOAL #1   Title  Pt will be able to perform SLS x 5 sec each leg for improved balance and strength.    Baseline  1 sec    Time  4    Period  Weeks    Status  New    Target Date  10/28/19      PT SHORT TERM GOAL #2   Title  Pt will ambulate 500' on level indoor surfaces without AFO for improved gait and strength independently.    Time  4    Period  Weeks    Status  New    Target Date  10/28/19        PT Long Term Goals - 09/28/19 1959      PT LONG TERM GOAL #1   Title  Pt will ambulate >1000' without AD  on varied surfaces mod I for improved community mobility.    Baseline  mod I on level but supervision on nonlevel without AD with left AFO.    Time  8    Period  Weeks    Status  On-going    Target Date  11/27/19      PT LONG TERM GOAL #2   Title  Pt will increase FGA from 18/30 to >22/30    Baseline  18/30 on 07/31/19, 19/30 on 09/28/19    Time  8    Period  Weeks    Status  On-going    Target Date  11/27/19      PT LONG TERM GOAL #3   Title  Pt will ambulate up/down 4 steps in reciprocal pattern without railing independently for improved functional strength and balance.    Baseline  able to perform reciprocal pattern on steps with 1 railing.    Time  8    Period  Weeks    Status  On-going    Target Date  11/27/19      PT LONG TERM GOAL #4   Title  Pt will increase gait speed to 1.2 m/s or higher for normal, safe community ambulator speed  without AD.    Baseline  without AD=0.30ms, 1.163m without AD on 09/28/19    Time  8    Period   Weeks    Status  New    Target Date  11/27/19            Plan - 10/15/19 1314    Clinical Impression Statement  PT focused more on gait and balance without AFO today. Pt showing continued improvement in left foot clearance. Was also able to increase gait speed on treadmill with AFO donned today.    Personal Factors and Comorbidities  Comorbidity 3+    Comorbidities  CHF, HTN, hyperlipidemia, glomerulonephritis, pancytopenia/chronic anemia, seizure    Examination-Activity Limitations  Stairs;Stand;Squat;Locomotion Level;Transfers;Bed Mobility    Examination-Participation Restrictions  Community Activity;Shop;Driving;Cleaning;Yard Work    StMerchant navy officerEvolving/Moderate complexity    Rehab Potential  Good    PT Frequency  1x / week    PT Duration  8 weeks    PT Treatment/Interventions  DME Instruction;ADLs/Self Care Home Management;Gait training;Stair training;Functional mobility training;Therapeutic activities;Therapeutic exercise;Balance training;Orthotic Fit/Training;Patient/family education;Neuromuscular re-education;Wheelchair mobility training;Passive range of motion;Manual techniques    PT Next Visit Plan  Continue BWS over treadmill to allow for decreased UE support. Add incline to get more hamstring activation. Gait on level surfaces without AFO. Resisted gait, and add in more dual task activities as well. Continue dynamic balance activities and dynamic gait overground    PT Home Exercise Plan  Access Code: [email or web address]    Consulted and Agree with Plan of Care  Patient       Patient will benefit from skilled therapeutic intervention in order to improve the following deficits and impairments:  Abnormal gait, Difficulty walking, Decreased balance, Decreased mobility, Decreased strength, Decreased knowledge of use of DME, Decreased endurance, Decreased activity tolerance, Decreased range of motion, Pain, Increased muscle spasms  Visit Diagnosis: Other  abnormalities of gait and mobility  Muscle weakness (generalized)     Problem List Patient Active Problem List   Diagnosis Date Noted  . Essential hypertension 05/25/2019  . 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 Acute blood loss anemia   . Steroid-induced hyperglycemia   . Acute on chronic anemia   . Seizures (HCR[facility]  . Spastic hemiparesis (HC[facility]  . ANCA-associated vasculitis (HCC[place]lege[place]  . Hypertension   . Thrombocytopenia (HCP[facility] [place]  . Acute unilateral cerebral infarction in a watershed distribution ([facility]2018  . Weakness   . CVA (cerebral vascular accident) (HCApex08/31/2020  . Malnutrition of moderate degree 07/31/2018  . Elevated serum protein level   . Multiple myeloma (HCLloyd  . Pancytopenia (HCHudspeth  . Normocytic normochromic anemia 07/16/2018  . Acute kidney failure (HCHancock03/08/2018  . NICM (nonischemic cardiomyopathy) ([facility]CE[facility] [place]07/06/2013  . History of ETOH abuse 11/12/2013  . Chronic combined systolic and diastolic CHF (congestive heart failure) (HCRichmond07/06/2013  . Congestive dilated cardiomyopathy (HCRoopville06/24/2015  . Malignant hypertension     EmElecta SniffPT, DPT, NCS 10/15/2019, 1:15 PM  [id number]: [phone] Fax:  [phone]: DeDOMONIK LEVARIORN: [id number] of Birth: 11/1960-06-02

## 2019-10-19 ENCOUNTER — Encounter: Payer: Self-pay | Admitting: Adult Health

## 2019-10-19 ENCOUNTER — Ambulatory Visit (INDEPENDENT_AMBULATORY_CARE_PROVIDER_SITE_OTHER): Payer: 59 | Admitting: Adult Health

## 2019-10-19 ENCOUNTER — Other Ambulatory Visit: Payer: Self-pay

## 2019-10-19 VITALS — BP 154/74 | HR 57 | Ht 73.0 in | Wt 159.0 lb

## 2019-10-19 DIAGNOSIS — E785 Hyperlipidemia, unspecified: Secondary | ICD-10-CM | POA: Diagnosis not present

## 2019-10-19 DIAGNOSIS — I639 Cerebral infarction, unspecified: Secondary | ICD-10-CM

## 2019-10-19 DIAGNOSIS — Z8673 Personal history of transient ischemic attack (TIA), and cerebral infarction without residual deficits: Secondary | ICD-10-CM | POA: Diagnosis not present

## 2019-10-19 DIAGNOSIS — I10 Essential (primary) hypertension: Secondary | ICD-10-CM

## 2019-10-19 DIAGNOSIS — R569 Unspecified convulsions: Secondary | ICD-10-CM

## 2019-10-19 MED ORDER — LEVETIRACETAM 500 MG PO TABS: 500.0000 mg | ORAL_TABLET | Freq: Two times a day (BID) | ORAL | 3 refills | Status: DC

## 2019-10-19 MED ORDER — DIVALPROEX SODIUM 500 MG PO DR TAB: 500.0000 mg | DELAYED_RELEASE_TABLET | Freq: Two times a day (BID) | ORAL | 3 refills | Status: DC

## 2019-10-19 NOTE — Patient Instructions (Signed)
Continue aspirin 81 mg daily  and Atorvastatin 80 mg daily  for secondary stroke prevention  Continue depakote 500mg  twice daily and keppra 500mg  twie daily for seizure prevention  Continue to follow up with PCP regarding cholesterol and blood pressure management   Continue to monitor blood pressure at home  Maintain strict control of hypertension with blood pressure goal below 130/90, diabetes with hemoglobin A1c goal below 6.5% and cholesterol with LDL cholesterol (bad cholesterol) goal below 70 mg/dL. I also advised the patient to eat a healthy diet with plenty of whole grains, cereals, fruits and vegetables, exercise regularly and maintain ideal body weight.  Followup in the future with me in 6 months or call earlier if needed       Thank you for coming to see [place] at [facility]. I hope we have been able to provide you high quality care today.  You may receive a patient satisfaction survey over the next few weeks. We would appreciate your feedback and comments so that we may continue to improve ourselves and the health of our patients.

## 2019-10-19 NOTE — Progress Notes (Signed)
[facility] [address] [place].  [address] [phone]       STROKE FOLLOW UP NOTE  [name] Date of Birth:  27-Oct-1960 Medical Record Number:  [id number]   Reason for Referral: stroke follow up    CHIEF COMPLAINT:  Chief Complaint  Patient presents with  . Follow-up    rm 9 here for a f/u on a stroke. Pt is haing no new sx. Pt is accompanied by his wife.    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  He is accompanied by his wife.  He has been stable since prior visit 6 months ago without new stroke/TIA symptoms or recurrent seizure activity.  Continues to work with outpatient PT for residual stroke deficits of LLE weakness, gait impairment and imbalance.  He endorses ongoing improvement. He ambulates with cane and AFO brace occasionally depending on distance and terrain.  Continues on tizanidine 4 mg nightly with ongoing benefit of spasticity.  He continues on Depakote 500 mg twice daily and Keppra 500 mg twice daily tolerating well.  Continues on aspirin 81 mg daily and atorvastatin 80 mg daily for secondary stroke prevention.  Blood pressure today 154/74.  Continues to follow closely with nephrology history of thrombocytopenia and vasculitis currently in remission.  No concerns at this time.     History provided for reference purposes only Update 06/18/2019 JM: Jeremy Grant is a 59 year old male who is being seen today for stroke follow-up accompanied by his wife.  Residual stroke deficits left sided weakness but greatly improving with ongoing participation of physical therapy.  Improvement of prior spasticity with ongoing use of tizanidine nightly managed by physical medicine and rehab Dr. Posey Pronto.  he is now ambulatory with a cane and denies any recent falls.  He continues to use AFO brace when he leaves the house.  After prior visit, discussion with  nephrologist  Dr. Hollie Salk regarding possibly decreasing Depakote dosage due to thrombocytopenia.  Depakote dosage decreased and currently on 500 mg twice daily with patient/wife reporting improvement of lab work.  He does have follow-up with nephrologist next week.  He is also continued on Keppra 500 mg twice daily.  Denies any recent seizure activities.  Only possible side effects includes upper extremity mild tremors which she has not had previously but denies interference with ADLs.  His wife questions possible return to driving.  Continues on aspirin 81 mg daily and atorvastatin 80 mg daily for secondary stroke prevention without side effects.  Blood pressure today initially elevated at 181/88 but on recheck 138/80.  He does monitor at home and does endorse whitecoat syndrome and typically blood pressure at home typically 1 30-1 40/60 to 70s.  Denies new or worsening stroke/TIA symptoms.    Initial visit 03/03/2019 JM: Jeremy Grant is a 59 year old male who is being seen today for hospital follow-up.  Residual deficits of spastic left hemiparesis and continues to work with outpatient PT with reports of improvement.  He is able to ambulate with a cane but will use wheelchair for long distance.  He will occasionally have LLE spasms and continues on tizanidine with benefit.  No reoccurring seizure activity with ongoing use of Depakote 1000 mg twice daily and Keppra 500 mg twice daily.  Tolerating well without side effects.  Continues on aspirin 81 mg with mild bruising but no bleeding.  Continues on atorvastatin 80 mg without myalgias.  Blood  pressure today 124/72.  He continues to follow with Dr. Hollie Salk at [facility] with recent visit last week and initiated carvedilol due to continued elevated blood pressures.  He does not routinely monitor at home.  30-day cardiac event ordered but has not been completed at this time.  No further concerns at this time.  Stroke admission 01/12/2019: Jeremy Grant is a 58 y.o. male with history of nonischemic cardiomyopathy, hypertension, combination of systolic and diastolic cardiac dysfunction, congestive heart failure and arthritis presented on 01/12/2019 with L leg weakness.  Stroke work-up showed patchy right corpus callosum infarct with possible watershed secondary to unknown source.  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  MRA showed right ICA stenosis and pericallosal region (questionable emboli).  Carotid Dopplers show bilateral ICA 1 to 39% stenosis in VAs antegrade.  2D echo EF of 55% without cardiac source of embolus identified.  TCD no evidence of PFO.  Given pancytopenia, he is not a good AC candidate therefore TEE/loop not considered at that time.  Recommended 30-day cardiac event monitor outpatient.  LDL 122.  A1c 5.4.  Initiated DAPT for 3 weeks and aspirin alone.  HTN stable.  Initiated atorvastatin 80 mg daily for HLD and secondary stroke prevention.  No evidence or history of DM.  Other stroke risk factors include EtOH use, CAD, systolic and diastolic CHF, nonischemic cardiomyopathy and prior stroke on imaging.  Other active problems include glomerulonephritis, pancytopenia, shingles in May with postherpetic neuralgia now resolved as well as lumbar spinal disease.  Residual deficits of left hemiparesis and discharged to CIR for ongoing therapies on 01/15/2019.  During CIR admission, Plavix discontinued as well as Lovenox due to excessive bruising with bouts of anemia requiring transfusions.  Underwent EGD on 01/31/2019 with no active bleeding but did show nonbleeding distal esophageal ulcer as well as erosive gastropathy and recommended follow-up with GI outpatient.  He also had bouts of seizures with follow-up EEG negative and initiated Depakote 1000 mg twice daily and Keppra 500  mg twice daily.  He was discharged home with recommendation of outpatient therapies on 02/04/2019.       ROS:   14 system review of systems performed and negative with exception of weakness  PMH:  Past Medical History:  Diagnosis Date  . Arthritis   . CHF (congestive heart failure) ([place])   . Combined systolic and diastolic cardiac dysfunction    Echo 11/03/2013 EF 41%, grade 3 diastolic dysfunction  . Hypertension   . NICM (nonischemic cardiomyopathy) ([place])    a. L/RHC (11/05/13): RA: 3, RV 52/5, PA 49/19 (31), PCWP 10, AO 166/93, PA 67%, Fick CO/CI: 5.71/2.97, Lmain: normal, LAD: large, without signficant dz, first diagonal has 20% dz at ostium, LCx: normal, RCA: 30% stenosis at the bifurcation of PDA and PLOM    PSH:  Past Surgical History:  Procedure Laterality Date  . BIOPSY  01/31/2019   Procedure: BIOPSY;  Surgeon: Otis Brace, MD;  Location: [facility];  Service: Gastroenterology;;  . ESOPHAGOGASTRODUODENOSCOPY N/A 01/31/2019   Procedure: ESOPHAGOGASTRODUODENOSCOPY (EGD);  Surgeon: Otis Brace, MD;  Location: [facility] ENDOSCOPY;  Service: Gastroenterology;  Laterality: N/A;  . IR FLUORO GUIDE CV LINE RIGHT  07/25/2018  . IR US GUIDE VASC ACCESS RIGHT  07/25/2018  . LEFT AND RIGHT HEART CATHETERIZATION WITH CORONARY ANGIOGRAM N/A 11/05/2013   Procedure: LEFT AND RIGHT  HEART CATHETERIZATION WITH CORONARY ANGIOGRAM;  Surgeon: Peter M Martinique, MD;  Location: [facility] CATH LAB;  Service: Cardiovascular;  Laterality: N/A;  . RIGHT HEART CATHETERIZATION N/A 01/14/2014   Procedure: RIGHT HEART CATH;  Surgeon: Larey Dresser, MD;  Location: [facility] CATH LAB;  Service: Cardiovascular;  Laterality: N/A;    Social History:  Social History   Socioeconomic History  . Marital status: Married    Spouse name: BRENDA  . Number of children: 0  . Years of education: 76  . Highest education level: 12th grade  Occupational History  . Occupation: Medical sales representative  Tobacco Use  . Smoking status: Never  Smoker  . Smokeless tobacco: Never Used  Substance and Sexual Activity  . Alcohol use: Yes    Alcohol/week: 3.0 standard drinks    Types: 3 Glasses of wine per week  . Drug use: No  . Sexual activity: Not Currently    Birth control/protection: None  Other Topics Concern  . Not on file  Social History Narrative  . Not on file   Social Determinants of Health   Financial Resource Strain:   . Difficulty of Paying Living Expenses:   Food Insecurity:   . Worried About Charity fundraiser in the Last Year:   . Arboriculturist in the Last Year:   Transportation Needs:   . Film/video editor (Medical):   Marland Kitchen Lack of Transportation (Non-Medical):   Physical Activity:   . Days of Exercise per Week:   . Minutes of Exercise per Session:   Stress:   . Feeling of Stress :   Social Connections:   . Frequency of Communication with Friends and Family:   . Frequency of Social Gatherings with Friends and Family:   . Attends Religious Services:   . Active Member of Clubs or Organizations:   . Attends Archivist Meetings:   Marland Kitchen Marital Status:   Intimate Partner Violence:   . Fear of Current or Ex-Partner:   . Emotionally Abused:   Marland Kitchen Physically Abused:   . Sexually Abused:     Family History:  Family History  Problem Relation Age of Onset  . Heart attack Father   . Heart disease Father   . Arrhythmia Father   . Hypertension Brother   . Hypertension Brother     Medications:   Current Outpatient Medications on File Prior to Visit  Medication Sig Dispense Refill  . acetaminophen (TYLENOL) 325 MG tablet Take 2 tablets (650 mg total) by mouth every 6 (six) hours as needed for moderate pain.    Marland Kitchen amLODipine (NORVASC) 10 MG tablet Take 1 tablet (10 mg total) by mouth daily. 30 tablet 1  . aspirin EC 81 MG EC tablet Take 1 tablet (81 mg total) by mouth daily.    Marland Kitchen azaTHIOprine (IMURAN) 50 MG tablet Take 1 tablet (50 mg total) by mouth daily. 30 tablet 1  . carvedilol (COREG)  6.25 MG tablet Take 12.5 mg by mouth 2 (two) times daily with a meal. Does increased to 12.5mg , 2x/day    . divalproex (DEPAKOTE) 500 MG DR tablet Take 1 tablet (500 mg total) by mouth 2 (two) times daily. 60 tablet 3  . levETIRAcetam (KEPPRA) 500 MG tablet Take 1 tablet (500 mg total) by mouth 2 (two) times daily. 60 tablet 3  . pantoprazole (PROTONIX) 40 MG tablet Take 1 tablet (40 mg total) by mouth daily. 30 tablet 0  . predniSONE (DELTASONE) 5 MG tablet Take 5  mg by mouth daily with breakfast.    . tiZANidine (ZANAFLEX) 4 MG tablet Take 1 tablet (4 mg total) by mouth every 8 (eight) hours as needed for muscle spasms. 90 tablet 1  . atorvastatin (LIPITOR) 80 MG tablet Take 1 tablet (80 mg total) by mouth daily at 6 PM. 30 tablet 0   No current facility-administered medications on file prior to visit.    Allergies:   Allergies  Allergen Reactions  . Hydralazine Hcl      Physical Exam  Vitals:   10/19/19 0959 10/19/19 1002  BP: (!) 181/79 (!) 154/74  Pulse: 60 (!) 57  Weight: 159 lb (72.1 kg)   Height: 6\' 1"  (1.854 m)    Body mass index is 20.98 kg/m. No exam data present  General: well developed, well nourished,  pleasant middle-age Caucasian male, seated, in no evident distress Head: head normocephalic and atraumatic.   Neck: supple with no carotid or supraclavicular bruits Cardiovascular: regular rate and rhythm, no murmurs Musculoskeletal: no deformity Skin:  no rash/petichiae Vascular:  Normal pulses all extremities   Neurologic Exam Mental Status: Awake and fully alert.  Normal speech and language. Oriented to place and time. Recent and remote memory intact. Attention span, concentration and fund of knowledge appropriate. Mood and affect appropriate.  Cranial Nerves: Pupils equal, briskly reactive to light. Extraocular movements full without nystagmus. Visual fields full to confrontation. Hearing intact. Facial sensation intact.  Very mild left lower facial  weakness Motor: Full strength right upper and lower extremity.  Near normal strength right upper and lower extremity with only mild ankle dorsiflexion weakness with AFO in place Sensory.: intact to touch , pinprick , position and vibratory sensation.  Coordination: Rapid alternating movements equal symmetrically. Finger-to-nose and heel-to-shin performed accurately bilaterally. Gait and Station: Able to stand without assistance or difficulty.  Abnormal gait with steppage gait and mild imbalance greater with turns.  Mild difficulty standing on single leg.  Able to ambulate without assistive device. Reflexes: 1+ and symmetric. Toes downgoing.         ASSESSMENT: Jeremy Grant is a 59 y.o. 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  MRI also showed evidence of previous small subacute left frontal and chronic infarcts likely embolic etiology.  Recent shingles which shingles vasculitis consideration but left-sided subacute on chronic infarcts predated his onset of shingles hence unlikely to be caused.  Chronic pancytopenia with low platelet count and anemia and likely not a good candidate for anticoagulation.  Possible consideration of 30-day cardiac event monitor outpatient - patient declined further work-up.  During CIR admission, evidence of partial seizure activity and initiated Depakote and Keppra for seizure prophylaxis.  Vascular risk factors include HTN, HLD, EtOH use, CAD, systolic and diastolic CHF, nonischemic cardiomyopathy, pancytopenia and prior stroke on imaging. He continues on Keppra 500 mg twice daily and Depakote 500 mg twice daily without recent seizure activity.  Depakote dosage previously decreased by request of nephrologist due to thrombocytopenia.  Greatly improving from a stroke standpoint with residual mild LLE weakness and gait impairment     PLAN:  1. Right CR  infarct:  -Gait impairment, poststroke -continue to participate with outpatient therapies for ongoing hopeful improvement -continue aspirin 81 mg daily  and atorvastatin for secondary stroke prevention with ongoing prescribing by PCP.  -Maintain strict control of hypertension with blood pressure goal below 130/90, diabetes with hemoglobin A1c goal below  6.5% and cholesterol with LDL cholesterol (bad cholesterol) goal below 70 mg/dL.  I also advised the patient to eat a healthy diet with plenty of whole grains, cereals, fruits and vegetables, exercise regularly with at least 30 minutes of continuous activity daily and maintain ideal body weight.   2. Seizures, poststroke: Continuation of Keppra 500 mg twice daily and Depakote 500 mg twice daily for seizure prophylaxis.  Refill provided. 3. HTN: Stable.  Continue to follow with PCP for monitoring and management 4. HLD: Recent LDL 59 with goal of less than 70.  Continue atorvastatin and continue to follow PCP for prescribing, monitoring and management    Follow up in 6 months or call earlier if needed   I spent 35 minutes of face-to-face and non-face-to-face time with patient and wife.  This included previsit chart review, lab review, study review, order entry, electronic health record documentation, patient education     Frann Rider, [facility]  [facility] [address] [place], [place] [address]  Phone [phone] Fax [phone] Note: This document was prepared with digital dictation and possible smart phrase technology. Any transcriptional errors that result from this process are unintentional.

## 2019-10-20 NOTE — Progress Notes (Signed)
I agree with the above plan 

## 2019-10-22 ENCOUNTER — Other Ambulatory Visit: Payer: Self-pay

## 2019-10-22 ENCOUNTER — Ambulatory Visit: Payer: 59

## 2019-10-22 DIAGNOSIS — R2689 Other abnormalities of gait and mobility: Secondary | ICD-10-CM | POA: Diagnosis not present

## 2019-10-22 DIAGNOSIS — M6281 Muscle weakness (generalized): Secondary | ICD-10-CM

## 2019-10-22 NOTE — Therapy (Signed)
[facility] [address] [place], [place], [address] Phone: [phone]   Fax:  [phone]  Physical Therapy Treatment  Patient Details  Name: Jeremy Grant MRN: [id number] Date of Birth: [birth year] Referring Provider (PT): Dr. Letta Pate   Encounter Date: 10/22/2019   PT End of Session - 10/22/19 0932    Visit Number 50    Number of Visits 63    Date for PT Re-Evaluation 44/01/02   cert 90 days but poc 60 days   Authorization Type [employer] (Pt has recent change in insurance)    PT Start Time 0930    PT Stop Time 1013    PT Time Calculation (min) 43 min    Equipment Utilized During Treatment Gait belt   left AFO   Activity Tolerance Patient tolerated treatment well    Behavior During Therapy [facility] for tasks assessed/performed           Past Medical History:  Diagnosis Date  . Arthritis   . CHF (congestive heart failure) ([place])   . Combined systolic and diastolic cardiac dysfunction    Echo 11/03/2013 EF 72%, grade 3 diastolic dysfunction  . Hypertension   . NICM (nonischemic cardiomyopathy) ([place])    a. L/RHC (11/05/13): RA: 3, RV 52/5, PA 49/19 (31), PCWP 10, AO 166/93, PA 67%, Fick CO/CI: 5.71/2.97, Lmain: normal, LAD: large, without signficant dz, first diagonal has 20% dz at ostium, LCx: normal, RCA: 30% stenosis at the bifurcation of PDA and PLOM    Past Surgical History:  Procedure Laterality Date  . BIOPSY  01/31/2019   Procedure: BIOPSY;  Surgeon: Otis Brace, MD;  Location: [facility] ENDOSCOPY;  Service: Gastroenterology;;  . ESOPHAGOGASTRODUODENOSCOPY N/A 01/31/2019   Procedure: ESOPHAGOGASTRODUODENOSCOPY (EGD);  Surgeon: Otis Brace, MD;  Location: [facility] ENDOSCOPY;  Service: Gastroenterology;  Laterality: N/A;  . IR FLUORO GUIDE CV LINE RIGHT  07/25/2018  . IR US GUIDE VASC ACCESS RIGHT  07/25/2018  . LEFT AND RIGHT HEART CATHETERIZATION WITH CORONARY ANGIOGRAM N/A 11/05/2013   Procedure: LEFT AND  RIGHT HEART CATHETERIZATION WITH CORONARY ANGIOGRAM;  Surgeon: Peter M Martinique, MD;  Location: [facility] CATH LAB;  Service: Cardiovascular;  Laterality: N/A;  . RIGHT HEART CATHETERIZATION N/A 01/14/2014   Procedure: RIGHT HEART CATH;  Surgeon: Larey Dresser, MD;  Location: [facility] CATH LAB;  Service: Cardiovascular;  Laterality: N/A;    There were no vitals filed for this visit.   Subjective Assessment - 10/22/19 0931    Subjective Pt saw neurologist last week. Visit went well.    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 8/31-9/3 then inpatient rehab 9/3-9/23.PMH: CHF, HTN, hyperlipidemia, glomerulonephritis, pancytopenia/chronic anemia, seizure left-sided post stroke    Patient Stated Goals Pt wants to be able to walk again.    Currently in Pain? No/denies    Pain Onset In the past 7 days                             [facility] Adult PT Treatment/Exercise - 10/22/19 5366      Ambulation/Gait   Ambulation/Gait Yes    Gait Comments BWS over treadmill x 17 min at 1.6-1.2mh with head turns 2 min and 3% incline x 3 min . Pt was instructed to increase step length. PT provided CGA/min assist with head turns added. Pt staggered initially when turning head having to touch bar briefly but improved  as went on.       Neuro Re-ed    Neuro Re-ed Details  Dynamic gait activities in hallway: gait with head turn left/right 40' x 2, marching gait 40' x 4 and back wards gait 40' x 2  supervision. Verbal cues to increase step length posterior. Pt staggered a few times with head turns.                    PT Short Term Goals - 09/28/19 1956      PT SHORT TERM GOAL #1   Title Pt will be able to perform SLS x 5 sec each leg for improved balance and strength.    Baseline 1 sec    Time 4    Period Weeks    Status New    Target Date 10/28/19      PT SHORT TERM GOAL #2   Title Pt will ambulate 500' on level  indoor surfaces without AFO for improved gait and strength independently.    Time 4    Period Weeks    Status New    Target Date 10/28/19             PT Long Term Goals - 09/28/19 1959      PT LONG TERM GOAL #1   Title Pt will ambulate >1000' without AD  on varied surfaces mod I for improved community mobility.    Baseline mod I on level but supervision on nonlevel without AD with left AFO.    Time 8    Period Weeks    Status On-going    Target Date 11/27/19      PT LONG TERM GOAL #2   Title Pt will increase FGA from 18/30 to >22/30    Baseline 18/30 on 07/31/19, 19/30 on 09/28/19    Time 8    Period Weeks    Status On-going    Target Date 11/27/19      PT LONG TERM GOAL #3   Title Pt will ambulate up/down 4 steps in reciprocal pattern without railing independently for improved functional strength and balance.    Baseline able to perform reciprocal pattern on steps with 1 railing.    Time 8    Period Weeks    Status On-going    Target Date 11/27/19      PT LONG TERM GOAL #4   Title Pt will increase gait speed to 1.2 m/s or higher for normal, safe community ambulator speed without AD.    Baseline without AD=0.79ms, 1.173m without AD on 09/28/19    Time 8    Period Weeks    Status New    Target Date 11/27/19                 Plan - 10/22/19 1932    Clinical Impression Statement Pt continues to show improved gait quality overground and on treadmill. Was challenged with adding in head turns.    Personal Factors and Comorbidities Comorbidity 3+    Comorbidities CHF, HTN, hyperlipidemia, glomerulonephritis, pancytopenia/chronic anemia, seizure    Examination-Activity Limitations Stairs;Stand;Squat;Locomotion Level;Transfers;Bed Mobility    Examination-Participation Restrictions Community Activity;Shop;Driving;Cleaning;Yard Work    [employer] officervolving/Moderate complexity    Rehab Potential Good    PT Frequency 1x / week    PT Duration 8  weeks    PT Treatment/Interventions DME Instruction;ADLs/Self Care Home Management;Gait training;Stair training;Functional mobility training;Therapeutic activities;Therapeutic exercise;Balance training;Orthotic Fit/Training;Patient/family education;Neuromuscular re-education;Wheelchair mobility training;Passive range of motion;Manual techniques  PT Next Visit Plan Continue BWS over treadmill to allow for decreased UE support. Add incline to get more hamstring activation. Gait on level surfaces without AFO. Resisted gait, and add in more dual task activities as well. Continue dynamic balance activities and dynamic gait overground    PT Home Exercise Plan Access Code: [email or web address]    Consulted and Agree with Plan of Care Patient           Patient will benefit from skilled therapeutic intervention in order to improve the following deficits and impairments:  Abnormal gait, Difficulty walking, Decreased balance, Decreased mobility, Decreased strength, Decreased knowledge of use of DME, Decreased endurance, Decreased activity tolerance, Decreased range of motion, Pain, Increased muscle spasms  Visit Diagnosis: Other abnormalities of gait and mobility  Muscle weakness (generalized)     Problem List Patient Active Problem List   Diagnosis Date Noted  . Essential hypertension 05/25/2019  . 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 Acute blood loss anemia   . Steroid-induced hyperglycemia   . Acute on chronic anemia   . Seizures ([place])   . Spastic hemiparesis ([place])   . 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 Hypertension   . Thrombocytopenia ([place])   . Acute unilateral cerebral infarction in a watershed distribution [facility]) 01/15/2019  . Weakness   . CVA (cerebral vascular accident) ([place]) 01/12/2019  . Malnutrition of moderate degree 07/31/2018  . Elevated serum protein level   . Multiple myeloma ([place])   . Pancytopenia ([place])   . Normocytic normochromic anemia 07/16/2018  . Acute kidney failure ([place]) 07/16/2018  .  NICM (nonischemic cardiomyopathy) ([place]) 11/12/2013  . History of ETOH abuse 11/12/2013  . Chronic combined systolic and diastolic CHF (congestive heart failure) ([place]) 11/12/2013  . Congestive dilated cardiomyopathy ([place]) 11/04/2013  . Malignant hypertension     Electa Sniff, PT, DPT, NCS 10/22/2019, 7:36 PM  [facility] [address] [place], [place], [address] Phone: [phone]   Fax:  [phone]  Name: Jeremy Grant MRN: [id number] Date of Birth: March 28, 1961

## 2019-10-25 IMAGING — CT CT HEAD W/O CM
4 series · 16 of 47 positions shown, 18 images · non-contrast
Comparison: MRI brain 01/11/2019. No prior CT head.

CLINICAL DATA: 58-year-old with nonischemic cardiomyopathy,
combined systolic and diastolic heart failure, presenting with new
onset seizures.

EXAM:
CT HEAD WITHOUT CONTRAST
TECHNIQUE: Contiguous axial images were obtained from the base of the skull
through the vertex without intravenous contrast.

[Series 3: head wo · axial · 0.44mm/px · z∈[+1174,+1304]mm · 7 of 36 slices shown, 9 images]
[im 5/36  brain]
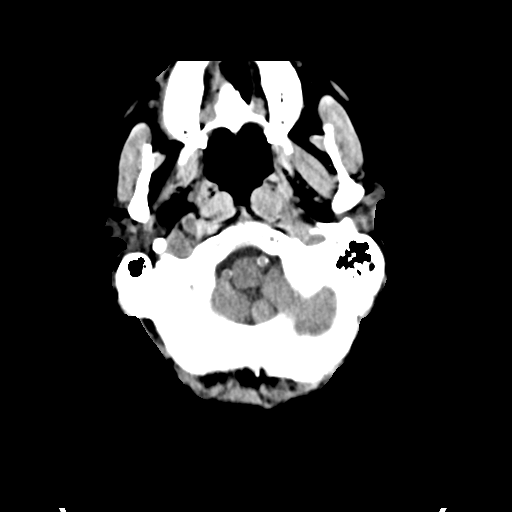
[im 5/36  bone]
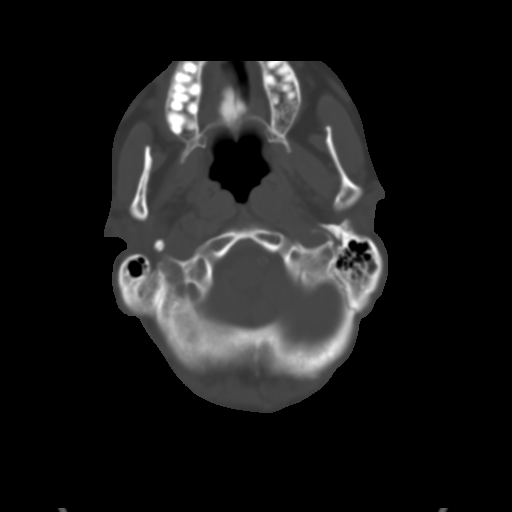
[im 9/36  brain]
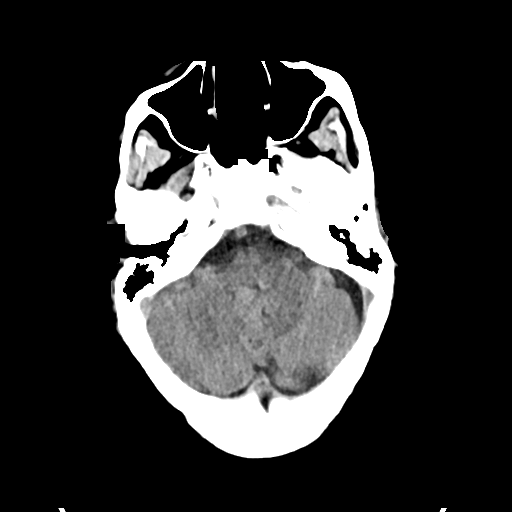
[im 14/36  brain]
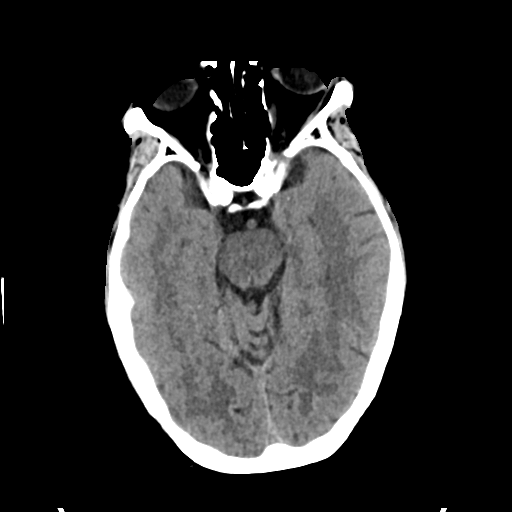
[im 18/36  brain]
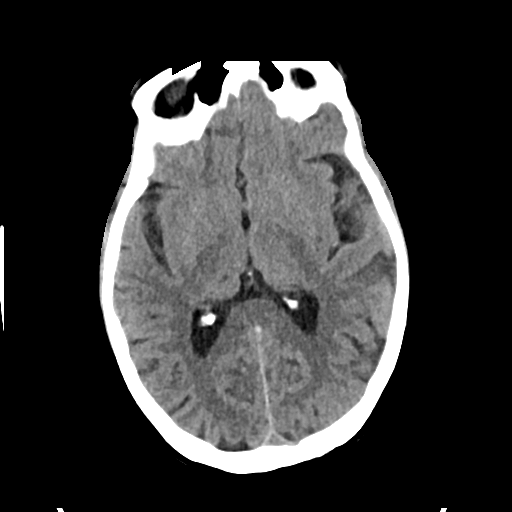
[im 22/36  brain]
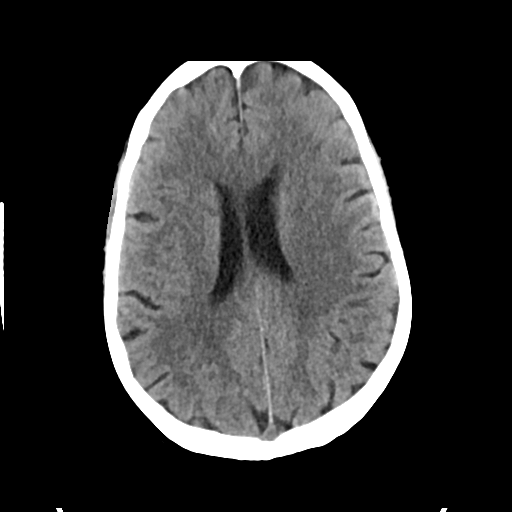
[im 22/36  bone]
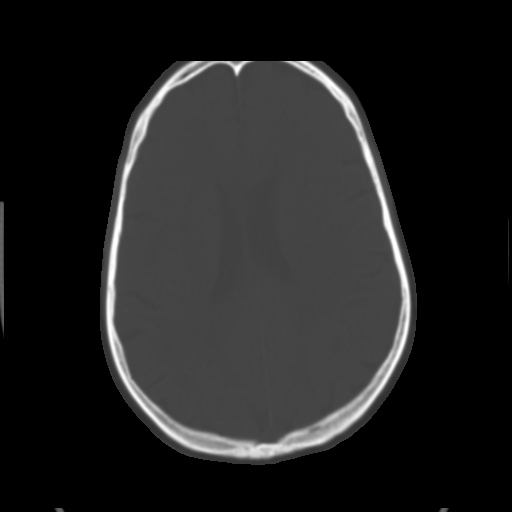
[im 27/36  brain]
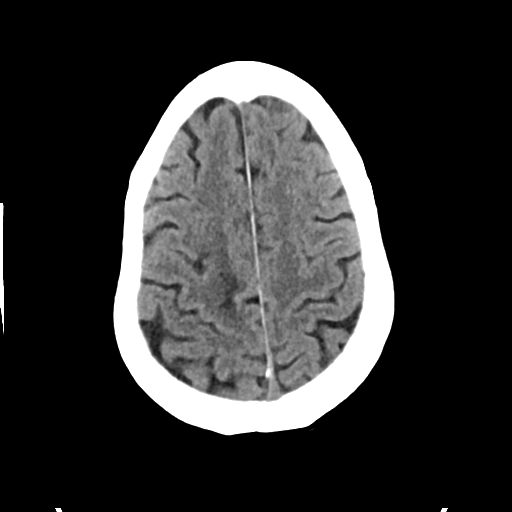
[im 31/36  brain]
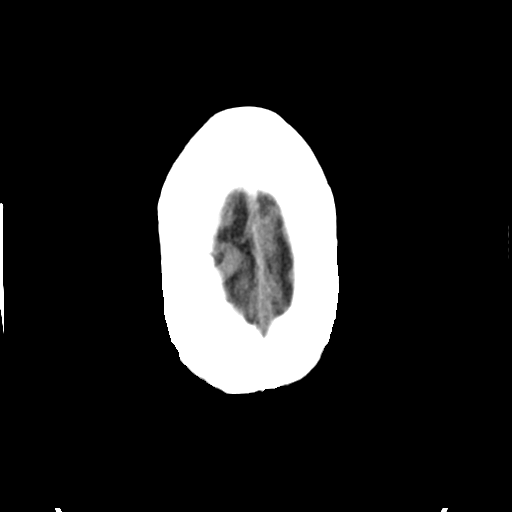

[Series 4: head bone · axial · 0.44mm/px · z∈[+1170,+1206]mm · 3 of 89 slices shown]
[im 9/89  bone]
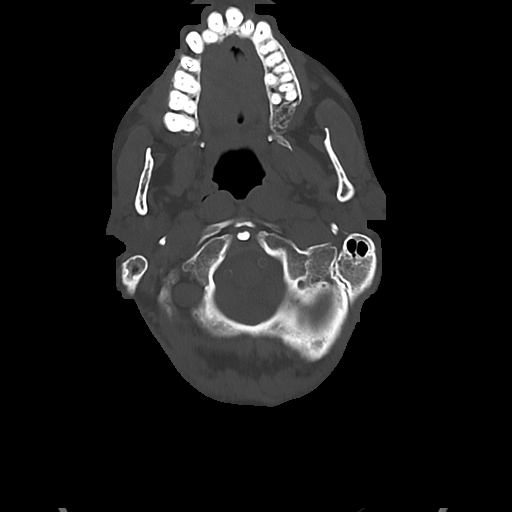
[im 18/89  bone]
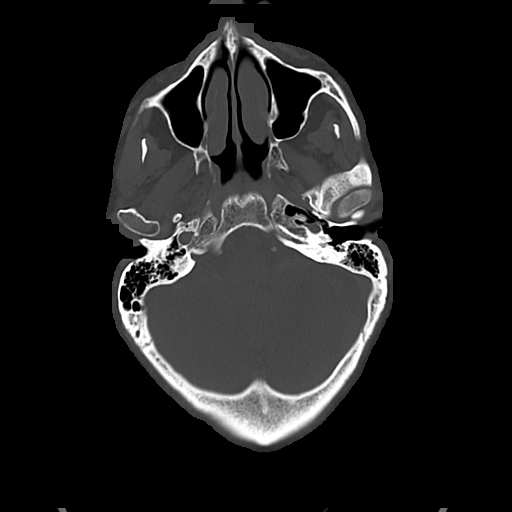
[im 27/89  bone]
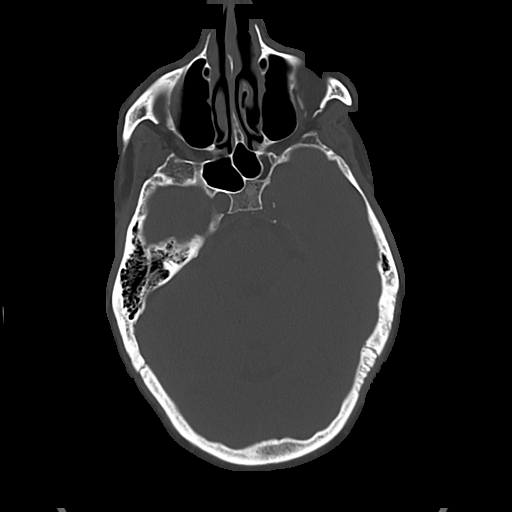

[Series 5: cor soft · coronal · 0.33mm/px · 3 of 75 slices shown]
[im 25/75  brain]
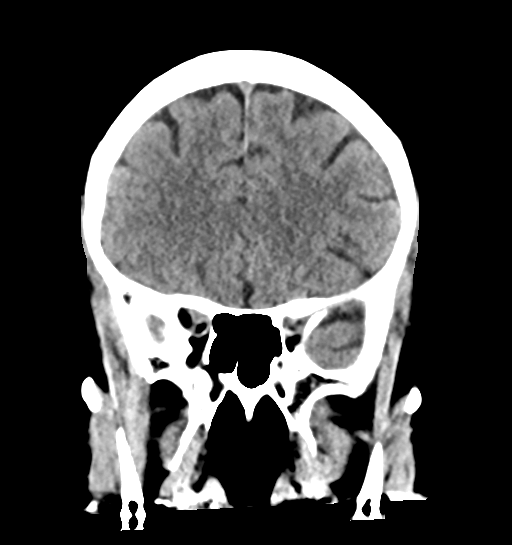
[im 33/75  brain]
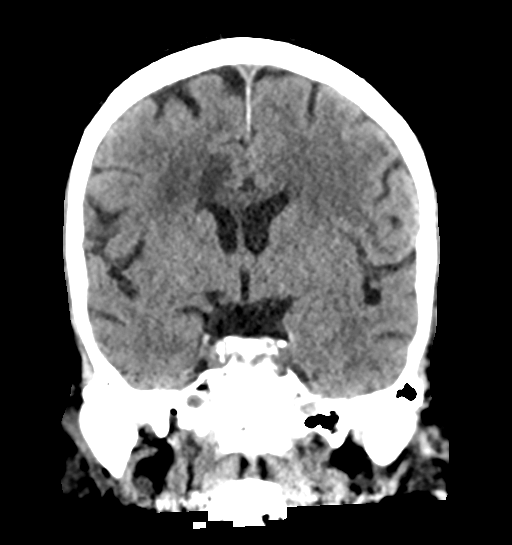
[im 42/75  brain]
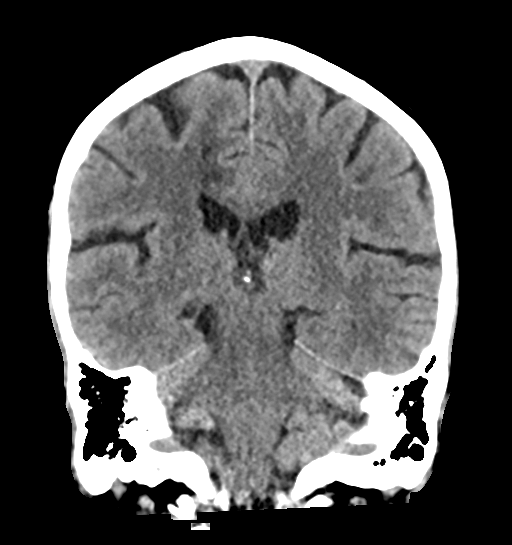

[Series 6: sag soft · sagittal · 0.35mm/px · 3 of 55 slices shown]
[im 19/55  brain]
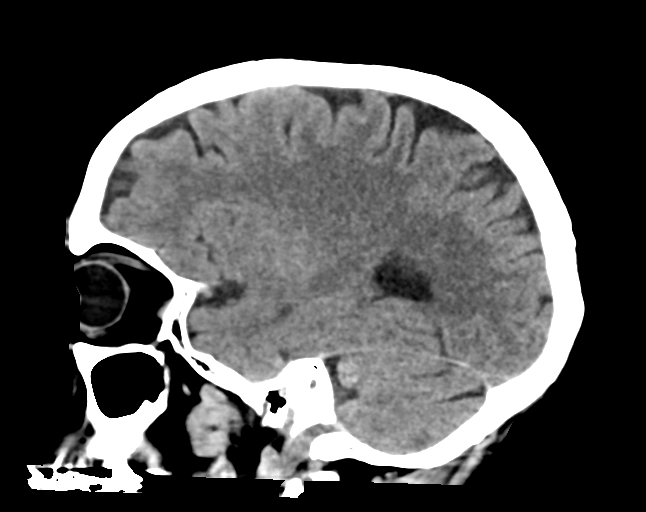
[im 28/55  brain]
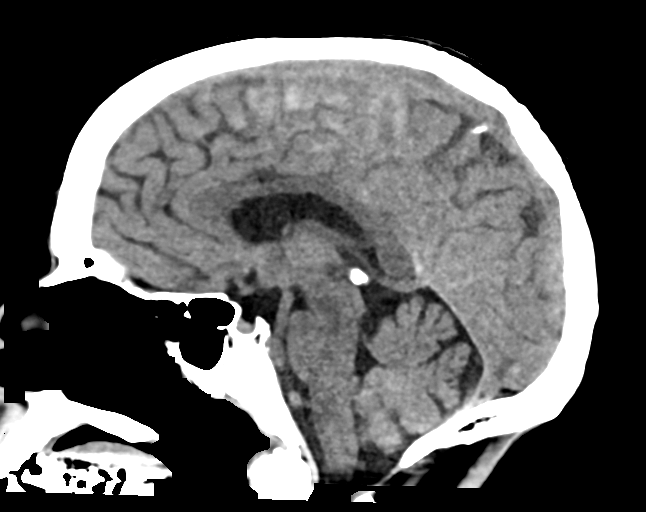
[im 37/55  brain]
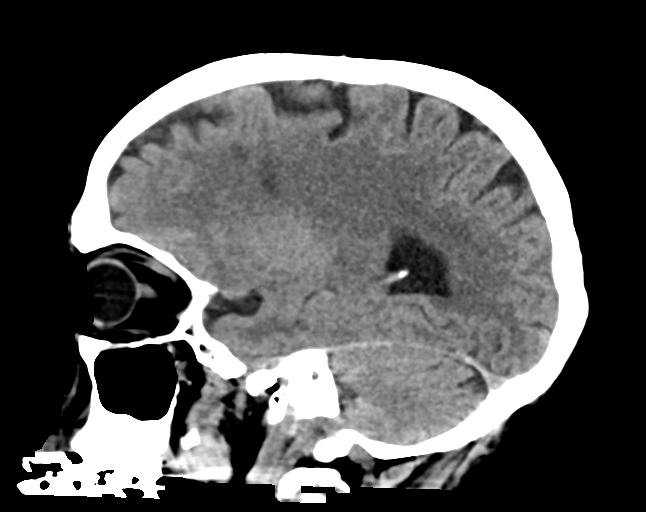

[16 of 47 positions shown; findings below may reference images not displayed]

FINDINGS: Brain: Subacute infarct involving the white matter of the RIGHT
frontal and parietal lobes and the anterior corpus callosum, with
the expected evolution since the MRI 9 days ago. No associated
hemorrhage or hematoma. No mass effect or midline shift. No
extra-axial fluid collections. No new/acute intracranial
abnormalities. Ventricular system normal in size and appearance for
age. No mass lesion.

Vascular: Moderate BILATERAL carotid siphon and severe BILATERAL
vertebral artery atherosclerosis. No hyperdense vessel.

Skull: No skull fracture or other focal osseous abnormality
involving the skull.

Sinuses/Orbits: Visualized paranasal sinuses, bilateral mastoid air
cells and bilateral middle ear cavities well-aerated. Visualized
orbits and globes normal in appearance.

Other: None.
IMPRESSION: 1. Subacute infarct involving the white matter of the RIGHT frontal
and parietal lobes and the anterior corpus callosum, with the
expected evolution since the MRI 9 days ago. No associated
hemorrhage or hematoma.
2. No new/acute intracranial abnormality.

## 2019-10-29 ENCOUNTER — Other Ambulatory Visit: Payer: Self-pay

## 2019-10-29 ENCOUNTER — Ambulatory Visit: Payer: 59

## 2019-10-29 DIAGNOSIS — R2689 Other abnormalities of gait and mobility: Secondary | ICD-10-CM | POA: Diagnosis not present

## 2019-10-29 DIAGNOSIS — I69354 Hemiplegia and hemiparesis following cerebral infarction affecting left non-dominant side: Secondary | ICD-10-CM

## 2019-10-29 NOTE — Therapy (Signed)
[facility] [address] [place], [place], [address] Phone: [phone]   Fax:  [phone]  Physical Therapy Treatment  Patient Details  Name: Jeremy Grant MRN: [id number] Date of Birth: 03/13/1961 Referring Provider (PT): Dr. Letta Pate   Encounter Date: 10/29/2019   PT End of Session - 10/29/19 0941    Visit Number 60    Number of Visits 31    Date for PT Re-Evaluation 16/60/63   cert 90 days but poc 60 days   Authorization Type [employer] (Pt has recent change in insurance)    PT Start Time [phone]    PT Stop Time 1017    PT Time Calculation (min) 39 min    Equipment Utilized During Treatment Gait belt   left AFO   Activity Tolerance Patient tolerated treatment well    Behavior During Therapy [facility] for tasks assessed/performed           Past Medical History:  Diagnosis Date  . Arthritis   . CHF (congestive heart failure) ([place])   . Combined systolic and diastolic cardiac dysfunction    Echo 11/03/2013 EF 10%, grade 3 diastolic dysfunction  . Hypertension   . NICM (nonischemic cardiomyopathy) ([place])    a. L/RHC (11/05/13): RA: 3, RV 52/5, PA 49/19 (31), PCWP 10, AO 166/93, PA 67%, Fick CO/CI: 5.71/2.97, Lmain: normal, LAD: large, without signficant dz, first diagonal has 20% dz at ostium, LCx: normal, RCA: 30% stenosis at the bifurcation of PDA and PLOM    Past Surgical History:  Procedure Laterality Date  . BIOPSY  01/31/2019   Procedure: BIOPSY;  Surgeon: Otis Brace, MD;  Location: [facility] ENDOSCOPY;  Service: Gastroenterology;;  . ESOPHAGOGASTRODUODENOSCOPY N/A 01/31/2019   Procedure: ESOPHAGOGASTRODUODENOSCOPY (EGD);  Surgeon: Otis Brace, MD;  Location: [facility] ENDOSCOPY;  Service: Gastroenterology;  Laterality: N/A;  . IR FLUORO GUIDE CV LINE RIGHT  07/25/2018  . IR US GUIDE VASC ACCESS RIGHT  07/25/2018  . LEFT AND RIGHT HEART CATHETERIZATION WITH CORONARY ANGIOGRAM N/A 11/05/2013   Procedure: LEFT AND  RIGHT HEART CATHETERIZATION WITH CORONARY ANGIOGRAM;  Surgeon: Peter M Martinique, MD;  Location: [facility] CATH LAB;  Service: Cardiovascular;  Laterality: N/A;  . RIGHT HEART CATHETERIZATION N/A 01/14/2014   Procedure: RIGHT HEART CATH;  Surgeon: Larey Dresser, MD;  Location: [facility] CATH LAB;  Service: Cardiovascular;  Laterality: N/A;    There were no vitals filed for this visit.   Subjective Assessment - 10/29/19 0940    Subjective Pt reports he has been doing a lot. Working in yard. Has been doing well.    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 8/31-9/3 then inpatient rehab 9/3-9/23.PMH: CHF, HTN, hyperlipidemia, glomerulonephritis, pancytopenia/chronic anemia, seizure left-sided post stroke    Patient Stated Goals Pt wants to be able to walk again.    Currently in Pain? No/denies    Pain Onset In the past 7 days                             [facility] Adult PT Treatment/Exercise - 10/29/19 0941      Ambulation/Gait   Ambulation/Gait Yes    Ambulation/Gait Assistance 6: Modified independent (Device/Increase time);5: Supervision    Ambulation/Gait Assistance Details Pt ambulated outside mod I on sidewalk and supervision on grass with inclines. Verbal cues to increase left step length    Ambulation Distance (Feet) 1000 Feet  Assistive device --   left AFO   Gait Pattern Step-through pattern;Decreased stance time - left    Ambulation Surface Level;Unlevel;Outdoor;Paved;Grass    Gait Comments BWS over treadmill x 10 min at 1.30mh and then 1 min cool down at 1.4 for 11 min total.. Pt was cued to increase left step length time to time and to try to have gentle steps. Pt able to demonstrate good reciprocal arm swing throughout. BP=160/84 after      Neuro Re-ed    Neuro Re-ed Details  Pt performed marching on grass hill facing up x 10 CGA with verbal cues to control SLS time and then manual pertubations  x 30 sec. SLS 5 sec RLE, 1-2 sec LLE                  PT Education - 10/29/19 1222    Education Details Pt to continue with current HEP    Person(s) Educated Patient    Methods Explanation    Comprehension Verbalized understanding            PT Short Term Goals - 10/29/19 0956      PT SHORT TERM GOAL #1   Title Pt will be able to perform SLS x 5 sec each leg for improved balance and strength.    Baseline 5 sec on right, 1-2 sec on left    Time 4    Period Weeks    Status Partially Met    Target Date 10/28/19      PT SHORT TERM GOAL #2   Title Pt will ambulate 500' on level indoor surfaces without AFO for improved gait and strength independently.    Time 4    Period Weeks    Status New    Target Date 10/28/19             PT Long Term Goals - 09/28/19 1959      PT LONG TERM GOAL #1   Title Pt will ambulate >1000' without AD  on varied surfaces mod I for improved community mobility.    Baseline mod I on level but supervision on nonlevel without AD with left AFO.    Time 8    Period Weeks    Status On-going    Target Date 11/27/19      PT LONG TERM GOAL #2   Title Pt will increase FGA from 18/30 to >22/30    Baseline 18/30 on 07/31/19, 19/30 on 09/28/19    Time 8    Period Weeks    Status On-going    Target Date 11/27/19      PT LONG TERM GOAL #3   Title Pt will ambulate up/down 4 steps in reciprocal pattern without railing independently for improved functional strength and balance.    Baseline able to perform reciprocal pattern on steps with 1 railing.    Time 8    Period Weeks    Status On-going    Target Date 11/27/19      PT LONG TERM GOAL #4   Title Pt will increase gait speed to 1.2 m/s or higher for normal, safe community ambulator speed without AD.    Baseline without AD=0.831m, 1.1352mwithout AD on 09/28/19    Time 8    Period Weeks    Status New    Target Date 11/27/19                 Plan - 10/29/19 1222    Clinical  Impression Statement  PT reassessed SLS today. Pt met for RLE but unable to meet on LLE. Pt continues to progress with gait on varied surfaces. Faster speed on treadmill today for full time with good arm swing throughout.    Personal Factors and Comorbidities Comorbidity 3+    Comorbidities CHF, HTN, hyperlipidemia, glomerulonephritis, pancytopenia/chronic anemia, seizure    Examination-Activity Limitations Stairs;Stand;Squat;Locomotion Level;Transfers;Bed Mobility    Examination-Participation Restrictions Community Activity;Shop;Driving;Cleaning;Yard Work    [employer]chant [employer] officer Evolving/Moderate complexity    Rehab Potential Good    PT Frequency 1x / week    PT Duration 8 weeks    PT Treatment/Interventions DME Instruction;ADLs/Self Care Home Management;Gait training;Stair training;Functional mobility training;Therapeutic activities;Therapeutic exercise;Balance training;Orthotic Fit/Training;Patient/family education;Neuromuscular re-education;Wheelchair mobility training;Passive range of motion;Manual techniques    PT Next Visit Plan Check STG for gait without AFO on level surface. Continue BWS over treadmill to allow for decreased UE support. Add incline to get more hamstring activation. Gait on level surfaces without AFO. Resisted gait, and add in more dual task activities as well. Continue dynamic balance activities and dynamic gait overground    PT Home Exercise Plan Access Code: [email or web address]    Consulted and Agree with Plan of Care Patient           Patient will benefit from skilled therapeutic intervention in order to improve the following deficits and impairments:  Abnormal gait, Difficulty walking, Decreased balance, Decreased mobility, Decreased strength, Decreased knowledge of use of DME, Decreased endurance, Decreased activity tolerance, Decreased range of motion, Pain, Increased muscle spasms  Visit Diagnosis: Other abnormalities of gait and mobility  Hemiplegia and  hemiparesis following cerebral infarction affecting left non-dominant side [facility])     Problem List Patient Active Problem List   Diagnosis Date Noted  . Essential hypertension 05/25/2019  . 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 Acute blood loss anemia   . Steroid-induced hyperglycemia   . Acute on chronic anemia   . Seizures ([place])   . Spastic hemiparesis ([place])   . 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 Hypertension   . Thrombocytopenia ([place])   . Acute unilateral cerebral infarction in a watershed distribution [facility]) 01/15/2019  . Weakness   . CVA (cerebral vascular accident) ([place]) 01/12/2019  . Malnutrition of moderate degree 07/31/2018  . Elevated serum protein level   . Multiple myeloma ([place])   . Pancytopenia ([place])   . Normocytic normochromic anemia 07/16/2018  . Acute kidney failure ([place]) 07/16/2018  . NICM (nonischemic cardiomyopathy) ([place]) 11/12/2013  . History of ETOH abuse 11/12/2013  . Chronic combined systolic and diastolic CHF (congestive heart failure) ([place]) 11/12/2013  . Congestive dilated cardiomyopathy ([place]) 11/04/2013  . Malignant hypertension     Electa Sniff, PT, DPT, NCS 10/29/2019, 12:24 PM  [facility] [address] [place], [place], [address] Phone: [phone]   Fax:  [phone]  Name: Jeremy Grant MRN: [id number] Date of Birth: 1961/02/15

## 2019-11-06 ENCOUNTER — Ambulatory Visit: Payer: 59

## 2019-11-06 ENCOUNTER — Other Ambulatory Visit: Payer: Self-pay

## 2019-11-06 DIAGNOSIS — R2689 Other abnormalities of gait and mobility: Secondary | ICD-10-CM | POA: Diagnosis not present

## 2019-11-06 DIAGNOSIS — M6281 Muscle weakness (generalized): Secondary | ICD-10-CM

## 2019-11-06 NOTE — Therapy (Signed)
[facility] [address] [place], [place], [address] Phone: [phone]   Fax:  [phone]  Physical Therapy Treatment  Patient Details  Name: Jeremy Grant MRN: [id number] Date of Birth: [birth year] Referring Provider (PT): Dr. Letta Pate   Encounter Date: 11/06/2019   PT End of Session - 11/06/19 0801    Visit Number 60    Number of Visits 43    Date for PT Re-Evaluation 18/29/93   cert 90 days but poc 60 days   Authorization Type [employer] (Pt has recent change in insurance)    PT Start Time 0800    PT Stop Time 0848    PT Time Calculation (min) 48 min    Equipment Utilized During Treatment Gait belt   left AFO   Activity Tolerance Patient tolerated treatment well    Behavior During Therapy [facility] for tasks assessed/performed           Past Medical History:  Diagnosis Date  . Arthritis   . CHF (congestive heart failure) ([place])   . Combined systolic and diastolic cardiac dysfunction    Echo 11/03/2013 EF 71%, grade 3 diastolic dysfunction  . Hypertension   . NICM (nonischemic cardiomyopathy) ([place])    a. L/RHC (11/05/13): RA: 3, RV 52/5, PA 49/19 (31), PCWP 10, AO 166/93, PA 67%, Fick CO/CI: 5.71/2.97, Lmain: normal, LAD: large, without signficant dz, first diagonal has 20% dz at ostium, LCx: normal, RCA: 30% stenosis at the bifurcation of PDA and PLOM    Past Surgical History:  Procedure Laterality Date  . BIOPSY  01/31/2019   Procedure: BIOPSY;  Surgeon: Otis Brace, MD;  Location: [facility] ENDOSCOPY;  Service: Gastroenterology;;  . ESOPHAGOGASTRODUODENOSCOPY N/A 01/31/2019   Procedure: ESOPHAGOGASTRODUODENOSCOPY (EGD);  Surgeon: Otis Brace, MD;  Location: [facility] ENDOSCOPY;  Service: Gastroenterology;  Laterality: N/A;  . IR FLUORO GUIDE CV LINE RIGHT  07/25/2018  . IR US GUIDE VASC ACCESS RIGHT  07/25/2018  . LEFT AND RIGHT HEART CATHETERIZATION WITH CORONARY ANGIOGRAM N/A 11/05/2013   Procedure: LEFT AND  RIGHT HEART CATHETERIZATION WITH CORONARY ANGIOGRAM;  Surgeon: Peter M Martinique, MD;  Location: [facility] CATH LAB;  Service: Cardiovascular;  Laterality: N/A;  . RIGHT HEART CATHETERIZATION N/A 01/14/2014   Procedure: RIGHT HEART CATH;  Surgeon: Larey Dresser, MD;  Location: [facility] CATH LAB;  Service: Cardiovascular;  Laterality: N/A;    There were no vitals filed for this visit.   Subjective Assessment - 11/06/19 0802    Subjective Pt reports that he is doing well. Does have skin tear on left forearm from when was getting out of car the door closed on arm some. Bandaged up. Pt reports he did a lot of walking yesterday when went shopping and did escalator for first time.    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 8/31-9/3 then inpatient rehab 9/3-9/23.PMH: CHF, HTN, hyperlipidemia, glomerulonephritis, pancytopenia/chronic anemia, seizure left-sided post stroke    Patient Stated Goals Pt wants to be able to walk again.    Currently in Pain? No/denies    Pain Onset In the past 7 days                             [facility] Adult PT Treatment/Exercise - 11/06/19 0804      Transfers   Transfers Sit to Stand;Stand to Sit    Five time sit to stand comments  from chair without arms with verbal cues to lean forward to help rise. Pt had to use a couple attempts at times.      Ambulation/Gait   Ambulation/Gait Yes    Ambulation/Gait Assistance 5: Supervision    Ambulation/Gait Assistance Details Pt ambulated on level surfaces without AFO donned today. Verbal cues to increase left heel strike and be sure to clear leg with hip flexion. Pt had slight decreased stance time on left without brace compared to with.    Ambulation Distance (Feet) 690 Feet    Assistive device None    Gait Pattern Step-through pattern;Decreased dorsiflexion - left    Ambulation Surface Level;Indoor    Gait Comments BWS over treadmill x  7 min at 1.67mh without AFO with 1 UE support and PT monitoring left ankle throughout with verbal cues to increase left hip flexion and heel strike. Pt reported feeling less stable without AFO on treadmill and had to work harder.      Neuro Re-ed    Neuro Re-ed Details  In // bars: step-ups on 4" step with LLE 10 x 2 without UE support with no AFO with verbal cues to shift weight to left and take his time to control more, step-downs off 4" step with 1 UE support lowering with LLE x 10. Standing on soft blue foam beam perpendicular x 30 sec then steps ups on beam with LLE without UE support x 10. PT provided CGA for safety and again cued to take his time to control more. Tandem stance on foam beam x 30 sec each position CGA.                    PT Short Term Goals - 11/06/19 0901      PT SHORT TERM GOAL #1   Title Pt will be able to perform SLS x 5 sec each leg for improved balance and strength.    Baseline 5 sec on right, 1-2 sec on left    Time 4    Period Weeks    Status Partially Met    Target Date 10/28/19      PT SHORT TERM GOAL #2   Title Pt will ambulate 500' on level indoor surfaces without AFO for improved gait and strength independently.    Baseline Supervision 690' without AFO on level surfaces    Time 4    Period Weeks    Status Partially Met    Target Date 10/28/19             PT Long Term Goals - 09/28/19 1959      PT LONG TERM GOAL #1   Title Pt will ambulate >1000' without AD  on varied surfaces mod I for improved community mobility.    Baseline mod I on level but supervision on nonlevel without AD with left AFO.    Time 8    Period Weeks    Status On-going    Target Date 11/27/19      PT LONG TERM GOAL #2   Title Pt will increase FGA from 18/30 to >22/30    Baseline 18/30 on 07/31/19, 19/30 on 09/28/19    Time 8    Period Weeks    Status On-going    Target Date 11/27/19      PT LONG TERM GOAL #3   Title Pt will ambulate up/down 4 steps in  reciprocal pattern without railing independently for improved functional strength and balance.    Baseline able to  perform reciprocal pattern on steps with 1 railing.    Time 8    Period Weeks    Status On-going    Target Date 11/27/19      PT LONG TERM GOAL #4   Title Pt will increase gait speed to 1.2 m/s or higher for normal, safe community ambulator speed without AD.    Baseline without AD=0.28ms, 1.148m without AD on 09/28/19    Time 8    Period Weeks    Status New    Target Date 11/27/19                 Plan - 11/06/19 0902    Clinical Impression Statement PT focused on gait training and balance without left AFO donned today. Pt continues to show improving control and strength but is more challenged without AFO not feeling as stable.    Personal Factors and Comorbidities Comorbidity 3+    Comorbidities CHF, HTN, hyperlipidemia, glomerulonephritis, pancytopenia/chronic anemia, seizure    Examination-Activity Limitations Stairs;Stand;Squat;Locomotion Level;Transfers;Bed Mobility    Examination-Participation Restrictions Community Activity;Shop;Driving;Cleaning;Yard Work    StMerchant navy officervolving/Moderate complexity    Rehab Potential Good    PT Frequency 1x / week    PT Duration 8 weeks    PT Treatment/Interventions DME Instruction;ADLs/Self Care Home Management;Gait training;Stair training;Functional mobility training;Therapeutic activities;Therapeutic exercise;Balance training;Orthotic Fit/Training;Patient/family education;Neuromuscular re-education;Wheelchair mobility training;Passive range of motion;Manual techniques    PT Next Visit Plan . Continue BWS over treadmill to allow for decreased UE support. Add incline to get more hamstring activation. Gait on level surfaces without AFO. Resisted gait, and add in more dual task activities as well. Continue dynamic balance activities and dynamic gait overground    PT Home Exercise Plan Access Code: [email or web address]     Consulted and Agree with Plan of Care Patient           Patient will benefit from skilled therapeutic intervention in order to improve the following deficits and impairments:  Abnormal gait, Difficulty walking, Decreased balance, Decreased mobility, Decreased strength, Decreased knowledge of use of DME, Decreased endurance, Decreased activity tolerance, Decreased range of motion, Pain, Increased muscle spasms  Visit Diagnosis: Other abnormalities of gait and mobility  Muscle weakness (generalized)     Problem List Patient Active Problem List   Diagnosis Date Noted  . Essential hypertension 05/25/2019  . 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 Acute blood loss anemia   . Steroid-induced hyperglycemia   . Acute on chronic anemia   . Seizures (HCSpringport  . Spastic hemiparesis (HCPittsville  . ANCA-associated vasculitis (HCTrujillo Alto  . Hypertension   . Thrombocytopenia (HCSt. Leo  . Acute unilateral cerebral infarction in a watershed distribution ([facility]/07/2018  . Weakness   . CVA (cerebral vascular accident) (HCMcClain08/31/2020  . Malnutrition of moderate degree 07/31/2018  . Elevated serum protein level   . Multiple myeloma (HCGulfport  . Pancytopenia (HCMaricopa  . Normocytic normochromic anemia 07/16/2018  . Acute kidney failure (HCValparaiso03/08/2018  . NICM (nonischemic cardiomyopathy) (HCMountain View Acres07/06/2013  . History of ETOH abuse 11/12/2013  . Chronic combined systolic and diastolic CHF (congestive heart failure) (HCStone07/06/2013  . Congestive dilated cardiomyopathy (HCSchleswig06/24/2015  . Malignant hypertension     EmElecta SniffPT, DPT, NCS 11/06/2019, 9:04 AM  [address]hone: [phone] Fax:  [phone]: Jeremy HANNENRN: [id number] of Birth: 11/1960-07-12

## 2019-11-12 ENCOUNTER — Other Ambulatory Visit: Payer: Self-pay

## 2019-11-12 ENCOUNTER — Ambulatory Visit: Payer: 59 | Attending: Physical Medicine & Rehabilitation

## 2019-11-12 DIAGNOSIS — R2689 Other abnormalities of gait and mobility: Secondary | ICD-10-CM | POA: Insufficient documentation

## 2019-11-12 DIAGNOSIS — I69354 Hemiplegia and hemiparesis following cerebral infarction affecting left non-dominant side: Secondary | ICD-10-CM | POA: Insufficient documentation

## 2019-11-12 DIAGNOSIS — M6281 Muscle weakness (generalized): Secondary | ICD-10-CM | POA: Insufficient documentation

## 2019-11-12 NOTE — Therapy (Signed)
[facility] [address] [place], [place], [address] Phone: [phone]   Fax:  [phone]  Physical Therapy Treatment  Patient Details  Name: Jeremy Grant MRN: [id number] Date of Birth: 11-Oct-1960 Referring Provider (PT): Dr. Letta Pate   Encounter Date: 11/12/2019   PT End of Session - 11/12/19 0938    Visit Number 56    Number of Visits 54    Date for PT Re-Evaluation 99/24/26   cert 90 days but poc 60 days   Authorization Type [employer] (Pt has recent change in insurance)    PT Start Time 0935    PT Stop Time 1016    PT Time Calculation (min) 41 min    Equipment Utilized During Treatment Gait belt   left AFO   Activity Tolerance Patient tolerated treatment well    Behavior During Therapy [facility] for tasks assessed/performed           Past Medical History:  Diagnosis Date  . Arthritis   . CHF (congestive heart failure) ([place])   . Combined systolic and diastolic cardiac dysfunction    Echo 11/03/2013 EF 83%, grade 3 diastolic dysfunction  . Hypertension   . NICM (nonischemic cardiomyopathy) ([place])    a. L/RHC (11/05/13): RA: 3, RV 52/5, PA 49/19 (31), PCWP 10, AO 166/93, PA 67%, Fick CO/CI: 5.71/2.97, Lmain: normal, LAD: large, without signficant dz, first diagonal has 20% dz at ostium, LCx: normal, RCA: 30% stenosis at the bifurcation of PDA and PLOM    Past Surgical History:  Procedure Laterality Date  . BIOPSY  01/31/2019   Procedure: BIOPSY;  Surgeon: Otis Brace, MD;  Location: [place] ENDOSCOPY;  Service: Gastroenterology;;  . ESOPHAGOGASTRODUODENOSCOPY N/A 01/31/2019   Procedure: ESOPHAGOGASTRODUODENOSCOPY (EGD);  Surgeon: Otis Brace, MD;  Location: [facility] ENDOSCOPY;  Service: Gastroenterology;  Laterality: N/A;  . IR FLUORO GUIDE CV LINE RIGHT  07/25/2018  . IR US GUIDE VASC ACCESS RIGHT  07/25/2018  . LEFT AND RIGHT HEART CATHETERIZATION WITH CORONARY ANGIOGRAM N/A 11/05/2013   Procedure: LEFT AND  RIGHT HEART CATHETERIZATION WITH CORONARY ANGIOGRAM;  Surgeon: Peter M Martinique, MD;  Location: [facility] CATH LAB;  Service: Cardiovascular;  Laterality: N/A;  . RIGHT HEART CATHETERIZATION N/A 01/14/2014   Procedure: RIGHT HEART CATH;  Surgeon: Larey Dresser, MD;  Location: [facility] CATH LAB;  Service: Cardiovascular;  Laterality: N/A;    There were no vitals filed for this visit.   Subjective Assessment - 11/12/19 0938    Subjective Pt reports that he has to have physical with the doctor at [employer]. Denies any new issues.    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 8/31-9/3 then inpatient rehab 9/3-9/23.PMH: CHF, HTN, hyperlipidemia, glomerulonephritis, pancytopenia/chronic anemia, seizure left-sided post stroke    Patient Stated Goals Pt wants to be able to walk again.    Currently in Pain? No/denies    Pain Onset In the past 7 days                             [facility] Adult PT Treatment/Exercise - 11/12/19 0939      Ambulation/Gait   Ambulation/Gait Yes    Ambulation/Gait Assistance 5: Supervision    Ambulation/Gait Assistance Details Pt was cued to increase step length and be sure to tighten gluts during left stance.    Ambulation Distance (Feet) 400 Feet    Assistive device None  Gait Pattern Step-through pattern;Decreased dorsiflexion - left    Ambulation Surface Level;Indoor    Ramp 5: Supervision    Ramp Details (indicate cue type and reason) x2    Curb 5: Supervision    Curb Details (indicate cue type and reason) x 4. Pt performed stepping up with each leg and stepping down with each. Less stable lowering with LLE.    Gait Comments BWS over treadmill without AFO x 10 min at 1.55mh. Pt had 1 UE support throughout. PT facilitated at pelvis to try to get more left anterior pelvic rotation with swing phase which did improve after practice.      Neuro Re-ed    Neuro Re-ed Details   In // bars: step-ups with LLE on 4" step without UE support with verbal cues to shift weight and take his time x 10 then step-downs forward off step lowering with LLE x 10. Pt was cued to not turn pelvis but stay straight when lowering. Used UE support to step back up on step posterior. Repeated prior forward step-ups and step-offs on 6" step x 10 up and x 6 down.  All performed without AFO                  PT Education - 11/12/19 1201    Education Details Discussed plan to recert for a month at end of current cert to work more on gait without AFO    Person(s) Educated Patient;Spouse    Methods Explanation    Comprehension Verbalized understanding            PT Short Term Goals - 11/06/19 0901      PT SHORT TERM GOAL #1   Title Pt will be able to perform SLS x 5 sec each leg for improved balance and strength.    Baseline 5 sec on right, 1-2 sec on left    Time 4    Period Weeks    Status Partially Met    Target Date 10/28/19      PT SHORT TERM GOAL #2   Title Pt will ambulate 500' on level indoor surfaces without AFO for improved gait and strength independently.    Baseline Supervision 690' without AFO on level surfaces    Time 4    Period Weeks    Status Partially Met    Target Date 10/28/19             PT Long Term Goals - 09/28/19 1959      PT LONG TERM GOAL #1   Title Pt will ambulate >1000' without AD  on varied surfaces mod I for improved community mobility.    Baseline mod I on level but supervision on nonlevel without AD with left AFO.    Time 8    Period Weeks    Status On-going    Target Date 11/27/19      PT LONG TERM GOAL #2   Title Pt will increase FGA from 18/30 to >22/30    Baseline 18/30 on 07/31/19, 19/30 on 09/28/19    Time 8    Period Weeks    Status On-going    Target Date 11/27/19      PT LONG TERM GOAL #3   Title Pt will ambulate up/down 4 steps in reciprocal pattern without railing independently for improved functional strength and  balance.    Baseline able to perform reciprocal pattern on steps with 1 railing.    Time 8    Period Weeks  Status On-going    Target Date 11/27/19      PT LONG TERM GOAL #4   Title Pt will increase gait speed to 1.2 m/s or higher for normal, safe community ambulator speed without AD.    Baseline without AD=0.23ms, 1.132m without AD on 09/28/19    Time 8    Period Weeks    Status New    Target Date 11/27/19                 Plan - 11/12/19 1203    Clinical Impression Statement PT continued to work on mobility without AFO. Pt was able to show improving strength with step-ups and down with LLE today after practice. Also improved stability on treadmill but still needing 1 UE support.    Personal Factors and Comorbidities Comorbidity 3+    Comorbidities CHF, HTN, hyperlipidemia, glomerulonephritis, pancytopenia/chronic anemia, seizure    Examination-Activity Limitations Stairs;Stand;Squat;Locomotion Level;Transfers;Bed Mobility    Examination-Participation Restrictions Community Activity;Shop;Driving;Cleaning;Yard Work    [employer] officervolving/Moderate complexity    Rehab Potential Good    PT Frequency 1x / week    PT Duration 8 weeks    PT Treatment/Interventions DME Instruction;ADLs/Self Care Home Management;Gait training;Stair training;Functional mobility training;Therapeutic activities;Therapeutic exercise;Balance training;Orthotic Fit/Training;Patient/family education;Neuromuscular re-education;Wheelchair mobility training;Passive range of motion;Manual techniques    PT Next Visit Plan Continue BWS over treadmill without AFO. Can we remove UE suppport?  Gait on level surfaces without AFO. Resisted gait, and add in more dual task activities as well. Continue dynamic balance activities and dynamic gait overground    PT Home Exercise Plan Access Code: [email or web address]    Consulted and Agree with Plan of Care Patient           Patient will benefit from skilled  therapeutic intervention in order to improve the following deficits and impairments:  Abnormal gait, Difficulty walking, Decreased balance, Decreased mobility, Decreased strength, Decreased knowledge of use of DME, Decreased endurance, Decreased activity tolerance, Decreased range of motion, Pain, Increased muscle spasms  Visit Diagnosis: Other abnormalities of gait and mobility  Muscle weakness (generalized)  Hemiplegia and hemiparesis following cerebral infarction affecting left non-dominant side ([facility]    Problem List Patient Active Problem List   Diagnosis Date Noted  . Essential hypertension 05/25/2019  . 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 Acute blood loss anemia   . Steroid-induced hyperglycemia   . Acute on chronic anemia   . Seizures (HCFife Lake  . Spastic hemiparesis (HCMontclair  . ANCA-associated vasculitis (HCHunter  . Hypertension   . Thrombocytopenia (HCCornelius  . Acute unilateral cerebral infarction in a watershed distribution (H[facility]09/07/2018  . Weakness   . CVA (cerebral vascular accident) (HCCumberland Head08/31/2020  . Malnutrition of moderate degree 07/31/2018  . Elevated serum protein level   . Multiple myeloma (HCPorter  . Pancytopenia (HCGarfield  . Normocytic normochromic anemia 07/16/2018  . Acute kidney failure (HCWhalan03/08/2018  . NICM (nonischemic cardiomyopathy) (HCWilliamsburg07/06/2013  . History of ETOH abuse 11/12/2013  . Chronic combined systolic and diastolic CHF (congestive heart failure) (HCHillsdale07/06/2013  . Congestive dilated cardiomyopathy (HCNile06/24/2015  . Malignant hypertension     EmElecta SniffPT, DPT, NCS 11/12/2019, 12:05 PM  [place]hone: [phone] Fax:  [phone]: Jeremy MCKEITHANRN: [id number] of Birth: 11/1960/01/23

## 2019-11-20 ENCOUNTER — Ambulatory Visit: Payer: 59

## 2019-11-20 ENCOUNTER — Other Ambulatory Visit: Payer: Self-pay

## 2019-11-20 DIAGNOSIS — M6281 Muscle weakness (generalized): Secondary | ICD-10-CM

## 2019-11-20 DIAGNOSIS — I69354 Hemiplegia and hemiparesis following cerebral infarction affecting left non-dominant side: Secondary | ICD-10-CM

## 2019-11-20 DIAGNOSIS — R2689 Other abnormalities of gait and mobility: Secondary | ICD-10-CM

## 2019-11-20 NOTE — Therapy (Signed)
[facility] [address] [place], [place], [address] Phone: [phone]   Fax:  [phone]  Physical Therapy Treatment  Patient Details  Name: Jeremy Grant MRN: [id number] Date of Birth: 1961/02/07 Referring Provider (PT): Dr. Letta Pate   Encounter Date: 11/20/2019   PT End of Session - 11/20/19 0936    Visit Number 85    Number of Visits 59    Date for PT Re-Evaluation 17/40/81   cert 90 days but poc 60 days   Authorization Type [employer] (Pt has recent change in insurance)    PT Start Time 0932    PT Stop Time 1015    PT Time Calculation (min) 43 min    Equipment Utilized During Treatment Gait belt   left AFO   Activity Tolerance Patient tolerated treatment well    Behavior During Therapy [facility] for tasks assessed/performed           Past Medical History:  Diagnosis Date  . Arthritis   . CHF (congestive heart failure) ([place])   . Combined systolic and diastolic cardiac dysfunction    Echo 11/03/2013 EF 44%, grade 3 diastolic dysfunction  . Hypertension   . NICM (nonischemic cardiomyopathy) ([place])    a. L/RHC (11/05/13): RA: 3, RV 52/5, PA 49/19 (31), PCWP 10, AO 166/93, PA 67%, Fick CO/CI: 5.71/2.97, Lmain: normal, LAD: large, without signficant dz, first diagonal has 20% dz at ostium, LCx: normal, RCA: 30% stenosis at the bifurcation of PDA and PLOM    Past Surgical History:  Procedure Laterality Date  . BIOPSY  01/31/2019   Procedure: BIOPSY;  Surgeon: Otis Brace, MD;  Location: [facility] ENDOSCOPY;  Service: Gastroenterology;;  . ESOPHAGOGASTRODUODENOSCOPY N/A 01/31/2019   Procedure: ESOPHAGOGASTRODUODENOSCOPY (EGD);  Surgeon: Otis Brace, MD;  Location: [facility] ENDOSCOPY;  Service: Gastroenterology;  Laterality: N/A;  . IR FLUORO GUIDE CV LINE RIGHT  07/25/2018  . IR US GUIDE VASC ACCESS RIGHT  07/25/2018  . LEFT AND RIGHT HEART CATHETERIZATION WITH CORONARY ANGIOGRAM N/A 11/05/2013   Procedure: LEFT AND  RIGHT HEART CATHETERIZATION WITH CORONARY ANGIOGRAM;  Surgeon: Peter M Martinique, MD;  Location: [facility] CATH LAB;  Service: Cardiovascular;  Laterality: N/A;  . RIGHT HEART CATHETERIZATION N/A 01/14/2014   Procedure: RIGHT HEART CATH;  Surgeon: Larey Dresser, MD;  Location: [facility] CATH LAB;  Service: Cardiovascular;  Laterality: N/A;    There were no vitals filed for this visit.   Subjective Assessment - 11/20/19 0936    Subjective Pt reports he is doing well. Has noticed he is getting more cramps in left calf.    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 8/31-9/3 then inpatient rehab 9/3-9/23.PMH: CHF, HTN, hyperlipidemia, glomerulonephritis, pancytopenia/chronic anemia, seizure left-sided post stroke    Patient Stated Goals Pt wants to be able to walk again.    Currently in Pain? No/denies    Pain Onset In the past 7 days                             [facility] Adult PT Treatment/Exercise - 11/20/19 0939      Ambulation/Gait   Ambulation/Gait Yes    Ambulation/Gait Assistance 5: Supervision    Ambulation/Gait Assistance Details Pt was cued to increase left heel strike. Pt did have slight increased left knee flexion during left stance. Reported left leg not feeling quite as strong. No instability noted at ankle  but slight less DF.    Ambulation Distance (Feet) 1000 Feet    Assistive device None    Gait Pattern Step-through pattern;Decreased dorsiflexion - left    Ambulation Surface Level;Unlevel;Indoor;Outdoor      Neuro Re-ed    Neuro Re-ed Details  At counter: [employer] with RLE x 10 with tactile cues at pelvis to maintain level with shifting over left. Standing with RLE on soccerball for left SLS with fingertip support on right x 30 sec then with adding in moving ball ant/post and side to side x 10. Standing on airex feet together without UE support x 30 sec then with pertubation x 30 sec  then tapping heel off front to floor x 10 each leg with right fingertip support then repeated without UE support CGA with increased difficulty with tapping right foot down      Exercises   Exercises Other Exercises    Other Exercises  Step-ups on 6" bottom step x 10 LLE without UE support forward then lateral with LLE with light UE support x 10 CGA.                   PT Education - 11/20/19 1140    Education Details Discussed recert plan next visit to focus on more training without AFO    Person(s) Educated Patient    Methods Explanation    Comprehension Verbalized understanding            PT Short Term Goals - 11/06/19 0901      PT SHORT TERM GOAL #1   Title Pt will be able to perform SLS x 5 sec each leg for improved balance and strength.    Baseline 5 sec on right, 1-2 sec on left    Time 4    Period Weeks    Status Partially Met    Target Date 10/28/19      PT SHORT TERM GOAL #2   Title Pt will ambulate 500' on level indoor surfaces without AFO for improved gait and strength independently.    Baseline Supervision 690' without AFO on level surfaces    Time 4    Period Weeks    Status Partially Met    Target Date 10/28/19             PT Long Term Goals - 09/28/19 1959      PT LONG TERM GOAL #1   Title Pt will ambulate >1000' without AD  on varied surfaces mod I for improved community mobility.    Baseline mod I on level but supervision on nonlevel without AD with left AFO.    Time 8    Period Weeks    Status On-going    Target Date 11/27/19      PT LONG TERM GOAL #2   Title Pt will increase FGA from 18/30 to >22/30    Baseline 18/30 on 07/31/19, 19/30 on 09/28/19    Time 8    Period Weeks    Status On-going    Target Date 11/27/19      PT LONG TERM GOAL #3   Title Pt will ambulate up/down 4 steps in reciprocal pattern without railing independently for improved functional strength and balance.    Baseline able to perform reciprocal pattern on steps  with 1 railing.    Time 8    Period Weeks    Status On-going    Target Date 11/27/19      PT LONG TERM GOAL #4  Title Pt will increase gait speed to 1.2 m/s or higher for normal, safe community ambulator speed without AD.    Baseline without AD=0.8ms, 1.167m without AD on 09/28/19    Time 8    Period Weeks    Status New    Target Date 11/27/19                 Plan - 11/20/19 1140    Clinical Impression Statement Pt was able to progress gait without AFO outside today on paved surfaces. No ankle instability noted but did have slight increased left knee flexion with stance with slight pelvic drop. Pt does not trust left leg as much with activities promoting more LLE SLS without AFO.    Personal Factors and Comorbidities Comorbidity 3+    Comorbidities CHF, HTN, hyperlipidemia, glomerulonephritis, pancytopenia/chronic anemia, seizure    Examination-Activity Limitations Stairs;Stand;Squat;Locomotion Level;Transfers;Bed Mobility    Examination-Participation Restrictions Community Activity;Shop;Driving;Cleaning;Yard Work    [employer] officervolving/Moderate complexity    Rehab Potential Good    PT Frequency 1x / week    PT Duration 8 weeks    PT Treatment/Interventions DME Instruction;ADLs/Self Care Home Management;Gait training;Stair training;Functional mobility training;Therapeutic activities;Therapeutic exercise;Balance training;Orthotic Fit/Training;Patient/family education;Neuromuscular re-education;Wheelchair mobility training;Passive range of motion;Manual techniques    PT Next Visit Plan Recert next visit 1x/week for 4-8 weeks. Continue BWS over treadmill without AFO. Can we remove UE suppport?  Gait on level surfaces without AFO. Resisted gait, and add in more dual task activities as well. Continue dynamic balance activities and dynamic gait overground    PT Home Exercise Plan Access Code: [email or web address]    Consulted and Agree with Plan of Care Patient            Patient will benefit from skilled therapeutic intervention in order to improve the following deficits and impairments:  Abnormal gait, Difficulty walking, Decreased balance, Decreased mobility, Decreased strength, Decreased knowledge of use of DME, Decreased endurance, Decreased activity tolerance, Decreased range of motion, Pain, Increased muscle spasms  Visit Diagnosis: Other abnormalities of gait and mobility  Muscle weakness (generalized)  Hemiplegia and hemiparesis following cerebral infarction affecting left non-dominant side ([facility]    Problem List Patient Active Problem List   Diagnosis Date Noted  . Essential hypertension 05/25/2019  . 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 Acute blood loss anemia   . Steroid-induced hyperglycemia   . Acute on chronic anemia   . Seizures (HCSpackenkill  . Spastic hemiparesis (HCFontana  . ANCA-associated vasculitis (HCCeiba  . Hypertension   . Thrombocytopenia (HCDrakes Branch  . Acute unilateral cerebral infarction in a watershed distribution (HJer[facility]09/07/2018  . Weakness   . CVA (cerebral vascular accident) (HCClayton08/31/2020  . Malnutrition of moderate degree 07/31/2018  . Elevated serum protein level   . Multiple myeloma (HCWatertown  . Pancytopenia (HCJefferson  . Normocytic normochromic anemia 07/16/2018  . Acute kidney failure (HCOwsley03/08/2018  . NICM (nonischemic cardiomyopathy) (HCGrand Falls Plaza07/06/2013  . History of ETOH abuse 11/12/2013  . Chronic combined systolic and diastolic CHF (congestive heart failure) (HCGraham07/06/2013  . Congestive dilated cardiomyopathy (HCMyers Corner06/24/2015  . Malignant hypertension     EmElecta SniffPT, DPT, NCS 11/20/2019, 11:43 AM  [place]El Dorado[place]hone: [phone] Fax:  [phone]: DeMALACKI MCPHEARSONRN: [id number] of Birth: [birth year]

## 2019-11-26 ENCOUNTER — Other Ambulatory Visit: Payer: Self-pay

## 2019-11-26 ENCOUNTER — Ambulatory Visit: Payer: 59

## 2019-11-26 DIAGNOSIS — I69354 Hemiplegia and hemiparesis following cerebral infarction affecting left non-dominant side: Secondary | ICD-10-CM

## 2019-11-26 DIAGNOSIS — R2689 Other abnormalities of gait and mobility: Secondary | ICD-10-CM | POA: Diagnosis not present

## 2019-11-26 DIAGNOSIS — M6281 Muscle weakness (generalized): Secondary | ICD-10-CM

## 2019-11-26 NOTE — Patient Instructions (Signed)
Access Code: [email or web address] URL: [email or web address] Date: 11/26/2019 Prepared by: Cherly Anderson  Exercises Seated Hamstring Stretch with Strap - 3 x daily - 7 x weekly - 4 reps - 1 sets - 1 min hold Supine Short Arc Quad - 2 x daily - 7 x weekly - 10 reps - 1 sets Supine Bridge with Pelvic Floor Contraction on Swiss Ball - 2 x daily - 7 x weekly - 10 reps - 1 sets Supine Ankle Dorsiflexion Stretch with Caregiver - 3 x daily - 7 x weekly - 4 reps - 1 sets - 1 min hold Clamshell - 1 x daily - 7 x weekly - 5 reps - 2 sets Seated Knee Flexion - 1 x daily - 7 x weekly - 5 reps - 3 sets Prone Knee Flexion - 1 x daily - 5 x weekly - 10 reps - 2 sets Standing with Head Rotation - 2 x daily - 7 x weekly - 3 reps - 1 sets - 10 turns hold Standing Balance with Eyes Closed - 2 x daily - 7 x weekly - 2 reps - 1 sets - 30 sec hold Scapular Retraction with Resistance - 1 x daily - 7 x weekly - 10 reps - 3 sets Standing Gastroc Stretch at Counter - 2 x daily - 7 x weekly - 4 reps - 1 sets - 30 sec hold Heel rises with counter support - 2 x daily - 7 x weekly - 10 reps - 2 sets Tandem Stance - 1 x daily - 7 x weekly - 3 sets - 3 reps - 30 hold Modified Thomas Stretch - 3 x daily - 7 x weekly - 1 sets - 4 reps - 30 sec hold

## 2019-11-27 NOTE — Therapy (Signed)
[facility] [address] [place], [place], [address] Phone: [phone]   Fax:  [phone]  Physical Therapy Treatment/Recert  Patient Details  Name: Jeremy Grant MRN: [id number] Date of Birth: 1960-10-31 Referring Provider (PT): Dr. Letta Pate   Encounter Date: 11/26/2019   PT End of Session - 11/26/19 0915    Visit Number 31    Number of Visits 71    Date for PT Re-Evaluation 00/76/22   cert 90 days but poc 60 days   Authorization Type [employer] (Pt has recent change in insurance)    PT Start Time 0915    PT Stop Time 1002    PT Time Calculation (min) 47 min    Equipment Utilized During Treatment Gait belt   left AFO   Activity Tolerance Patient tolerated treatment well    Behavior During Therapy [facility] for tasks assessed/performed           Past Medical History:  Diagnosis Date  . Arthritis   . CHF (congestive heart failure) ([place])   . Combined systolic and diastolic cardiac dysfunction    Echo 11/03/2013 EF 63%, grade 3 diastolic dysfunction  . Hypertension   . NICM (nonischemic cardiomyopathy) ([place])    a. L/RHC (11/05/13): RA: 3, RV 52/5, PA 49/19 (31), PCWP 10, AO 166/93, PA 67%, Fick CO/CI: 5.71/2.97, Lmain: normal, LAD: large, without signficant dz, first diagonal has 20% dz at ostium, LCx: normal, RCA: 30% stenosis at the bifurcation of PDA and PLOM    Past Surgical History:  Procedure Laterality Date  . BIOPSY  01/31/2019   Procedure: BIOPSY;  Surgeon: Otis Brace, MD;  Location: [facility] ENDOSCOPY;  Service: Gastroenterology;;  . ESOPHAGOGASTRODUODENOSCOPY N/A 01/31/2019   Procedure: ESOPHAGOGASTRODUODENOSCOPY (EGD);  Surgeon: Otis Brace, MD;  Location: [facility] ENDOSCOPY;  Service: Gastroenterology;  Laterality: N/A;  . IR FLUORO GUIDE CV LINE RIGHT  07/25/2018  . IR US GUIDE VASC ACCESS RIGHT  07/25/2018  . LEFT AND RIGHT HEART CATHETERIZATION WITH CORONARY ANGIOGRAM N/A 11/05/2013   Procedure:  LEFT AND RIGHT HEART CATHETERIZATION WITH CORONARY ANGIOGRAM;  Surgeon: Peter M Martinique, MD;  Location: [facility] CATH LAB;  Service: Cardiovascular;  Laterality: N/A;  . RIGHT HEART CATHETERIZATION N/A 01/14/2014   Procedure: RIGHT HEART CATH;  Surgeon: Larey Dresser, MD;  Location: [facility] CATH LAB;  Service: Cardiovascular;  Laterality: N/A;    There were no vitals filed for this visit.   Subjective Assessment - 11/26/19 0915    Subjective Pt reports that he is doing well. Mowed grass yesterday and was less stiff getting off mower than he thought.    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 8/31-9/3 then inpatient rehab 9/3-9/23.PMH: CHF, HTN, hyperlipidemia, glomerulonephritis, pancytopenia/chronic anemia, seizure left-sided post stroke    Patient Stated Goals Pt wants to be able to walk again.    Currently in Pain? No/denies    Pain Onset In the past 7 days              [facility] PT Assessment - 11/26/19 0924      Assessment   Medical Diagnosis CVA with seizures    Referring Provider (PT) Dr. Letta Pate    Onset Date/Surgical Date 01/12/19      Flexibility   Soft Tissue Assessment /Muscle Length yes    Hamstrings right=45 degrees and left=28 degress supine      Special Tests   Other special tests right hip flexor=-14,  left=-12 in sidelying      Functional Gait  Assessment   Gait assessed  Yes    Gait Level Surface Walks 20 ft in less than 7 sec but greater than 5.5 sec, uses assistive device, slower speed, mild gait deviations, or deviates 6-10 in outside of the 12 in walkway width.    Change in Gait Speed Able to change speed, demonstrates mild gait deviations, deviates 6-10 in outside of the 12 in walkway width, or no gait deviations, unable to achieve a major change in velocity, or uses a change in velocity, or uses an assistive device.    Gait with Horizontal Head Turns Performs head turns smoothly  with no change in gait. Deviates no more than 6 in outside 12 in walkway width    Gait with Vertical Head Turns Performs head turns with no change in gait. Deviates no more than 6 in outside 12 in walkway width.    Gait and Pivot Turn Pivot turns safely in greater than 3 sec and stops with no loss of balance, or pivot turns safely within 3 sec and stops with mild imbalance, requires small steps to catch balance.    Step Over Obstacle Is able to step over 2 stacked shoe boxes taped together (9 in total height) without changing gait speed. No evidence of imbalance.    Gait with Narrow Base of Support Ambulates less than 4 steps heel to toe or cannot perform without assistance.    Gait with Eyes Closed Cannot walk 20 ft without assistance, severe gait deviations or imbalance, deviates greater than 15 in outside 12 in walkway width or will not attempt task.    Ambulating Backwards Walks 20 ft, uses assistive device, slower speed, mild gait deviations, deviates 6-10 in outside 12 in walkway width.    Steps Alternating feet, must use rail.    Total Score 19                         [facility] Adult PT Treatment/Exercise - 11/26/19 0924      Ambulation/Gait   Ambulation/Gait Yes    Ambulation/Gait Assistance 7: Independent    Ambulation/Gait Assistance Details on level surfaces inside. Pt does have decreased left toe off causing slight drop in step and decreased right step length without AFO. Left knee slightly flexed at mid stance    Ambulation Distance (Feet) 875 Feet    Assistive device None    Gait velocity 12.1 sec=0.36ms and 9.5 sec=1.042m with AFO, 11.3sec=0.8859mand 9.5sec=1.48m20mithout AFO    Stairs Yes    Stairs Assistance 6: Modified independent (Device/Increase time);4: Min guard    Stairs Assistance Details (indicate cue type and reason) pt performed first 4 with right rail  in reciprocal pattern.  Then performed 2nd  4 steps without rail CGA with reciprocal steps and down  in step-to pattern.    Stair Management Technique Alternating pattern;Step to pattern;One rail Right;No rails    Number of Stairs 8      Exercises   Exercises Other Exercises    Other Exercises  Supine hip flexor stretch 30 sec x 2 on left leg off edge of table and seated hamstring stretch left leg 30 sec x 2. Pt didn't have to lean at all to feel stretch in hamstring. Pt performed hip flexor stretch in Thomas test position bilateral with leg unable to reach table each way but reported feeling stretch off edge of table more. Advised to perform  hip flexor stretch off edge of table on both sides as well as seated hamstring stretch and added to HEP                  PT Education - 11/27/19 0713    Education Details Added hip flexor stretch to HEP and encouraged to really work more on stretching    Person(s) Educated Patient    Methods Explanation;Demonstration;Handout    Comprehension Verbalized understanding;Returned demonstration            PT Short Term Goals - 11/27/19 0714      PT SHORT TERM GOAL #1   Title Pt will be able to perform SLS x 5 sec each leg for improved balance and strength.    Baseline 5 sec on right, 1-2 sec on left    Time 4    Period Weeks    Status Partially Met    Target Date 12/27/19      PT SHORT TERM GOAL #2   Title Pt will ambulate 500' on level indoor surfaces without AFO for improved gait and strength independently.    Baseline Pt ambulated >500' on level surfaces without AFO independently on 11/26/19    Time 4    Period Weeks    Status Achieved    Target Date 10/28/19             PT Long Term Goals - 11/27/19 0715      PT LONG TERM GOAL #1   Title Pt will ambulate >1000' without AD  on varied surfaces mod I for improved community mobility.    Baseline mod I on level but supervision on nonlevel without AD with left AFO.    Time 8    Period Weeks    Status On-going    Target Date 01/26/20      PT LONG TERM GOAL #2   Title Pt will  increase FGA from 18/30 to >22/30    Baseline 18/30 on 07/31/19, 19/30 on 09/28/19, 19/30 on 11/26/19    Time 8    Period Weeks    Status Not Met    Target Date 01/26/20      PT LONG TERM GOAL #3   Title Pt will ambulate up/down 4 steps in reciprocal pattern without railing independently for improved functional strength and balance.    Baseline able to perform reciprocal pattern on steps with 1 railing. CGA and step-to pattern with descent without railing    Time 8    Period Weeks    Status On-going    Target Date 01/26/20      PT LONG TERM GOAL #4   Title Pt will increase gait speed to 1.2 m/s or higher for normal, safe community ambulator speed without AD.    Baseline without AD=0.37ms, 1.185m without AD on 09/28/19, 11/26/19 without AFO comfortable=0.8877mand 1.51m44mast    Time 8    Period Weeks    Status On-going    Target Date 01/26/20                 Plan - 11/27/19 0719    Clinical Impression Statement PT reassessed goals for recert visit today. Focus has shifted to working more without left AFO to see if patient can transition out of it. Pt was able to show slight improvement in comfortable gait speed without AFO to 0.3m/58mich is safe for community ambulator but decreased from norms. Without AFO pt has decreased left terminal stance and decreased left  knee extension at heel strike. PT further assessed hip flexor and hamstring muscle length with significant limitations bilateral. Added additional stretching to HEP. His FGA score did not have any change since last assessment and still indicated moderate fall risk. Pt continues to show improvement in activity tolerance with increasing gait distances and time. Pt will benefit from continued skilled PT to further work on strength, flexibility and balance to improve gait with focus on decreasing dependence on AFO.    Personal Factors and Comorbidities Comorbidity 3+    Comorbidities CHF, HTN, hyperlipidemia,  glomerulonephritis, pancytopenia/chronic anemia, seizure    Examination-Activity Limitations Stairs;Stand;Squat;Locomotion Level;Transfers;Bed Mobility    Examination-Participation Restrictions Community Activity;Shop;Driving;Cleaning;Yard Work    Merchant navy officer Evolving/Moderate complexity    Rehab Potential Good    PT Frequency 1x / week    PT Duration 8 weeks    PT Treatment/Interventions DME Instruction;ADLs/Self Care Home Management;Gait training;Stair training;Functional mobility training;Therapeutic activities;Therapeutic exercise;Balance training;Orthotic Fit/Training;Patient/family education;Neuromuscular re-education;Wheelchair mobility training;Passive range of motion;Manual techniques    PT Next Visit Plan Hip strengthening and flexibility with focus on hip flexor muscle length and hamstring. Needs to improve hip extension and PF strength for better push off with gait. Continue BWS over treadmill without AFO. Can we remove UE suppport?  Gait on level surfaces without AFO. Resisted gait, and add in more dual task activities as well. Continue dynamic balance activities and dynamic gait overground    PT Home Exercise Plan Access Code: [email or web address]    Consulted and Agree with Plan of Care Patient           Patient will benefit from skilled therapeutic intervention in order to improve the following deficits and impairments:  Abnormal gait, Difficulty walking, Decreased balance, Decreased mobility, Decreased strength, Decreased knowledge of use of DME, Decreased endurance, Decreased activity tolerance, Decreased range of motion, Pain, Increased muscle spasms  Visit Diagnosis: Other abnormalities of gait and mobility  Muscle weakness (generalized)  Hemiplegia and hemiparesis following cerebral infarction affecting left non-dominant side [facility])     Problem List Patient Active Problem List   Diagnosis Date Noted  . Essential hypertension 05/25/2019  . Spasm  03/23/2019  . Acute blood loss anemia   . Steroid-induced hyperglycemia   . Acute on chronic anemia   . Seizures ([place])   . Spastic hemiparesis ([place])   . 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 Hypertension   . Thrombocytopenia ([place])   . Acute unilateral cerebral infarction in a watershed distribution [facility]) 01/15/2019  . Weakness   . CVA (cerebral vascular accident) ([place]) 01/12/2019  . Malnutrition of moderate degree 07/31/2018  . Elevated serum protein level   . Multiple myeloma ([place])   . Pancytopenia ([place])   . Normocytic normochromic anemia 07/16/2018  . Acute kidney failure ([place]) 07/16/2018  . NICM (nonischemic cardiomyopathy) ([place]) 11/12/2013  . History of ETOH abuse 11/12/2013  . Chronic combined systolic and diastolic CHF (congestive heart failure) ([place]) 11/12/2013  . Congestive dilated cardiomyopathy ([place]) 11/04/2013  . Malignant hypertension     Electa Sniff, PT, DPT, NCS 11/27/2019, 7:27 AM  [facility] [address] [place], [place], [address] Phone: [phone]   Fax:  [phone]  Name: Jeremy Grant MRN: [id number] Date of Birth: 1961-04-16

## 2019-12-08 DIAGNOSIS — Z0271 Encounter for disability determination: Secondary | ICD-10-CM

## 2019-12-17 ENCOUNTER — Ambulatory Visit: Payer: 59 | Attending: Physical Medicine & Rehabilitation

## 2019-12-17 ENCOUNTER — Other Ambulatory Visit: Payer: Self-pay

## 2019-12-17 DIAGNOSIS — M6281 Muscle weakness (generalized): Secondary | ICD-10-CM | POA: Insufficient documentation

## 2019-12-17 DIAGNOSIS — R2689 Other abnormalities of gait and mobility: Secondary | ICD-10-CM | POA: Diagnosis not present

## 2019-12-17 DIAGNOSIS — I69354 Hemiplegia and hemiparesis following cerebral infarction affecting left non-dominant side: Secondary | ICD-10-CM | POA: Insufficient documentation

## 2019-12-17 NOTE — Therapy (Signed)
[facility] [address] [place], [place], [address] Phone: [phone]   Fax:  [phone]  Physical Therapy Treatment  Patient Details  Name: Jeremy Grant MRN: [id number] Date of Birth: 1960-10-31 Referring Provider (PT): Dr. Letta Pate   Encounter Date: 12/17/2019   PT End of Session - 12/17/19 0934    Visit Number 51    Number of Visits 53    Date for PT Re-Evaluation 19/16/60   cert 90 days but poc 60 days   Authorization Type [employer] (Pt has recent change in insurance)    PT Start Time [phone]    PT Stop Time 1015    PT Time Calculation (min) 44 min    Equipment Utilized During Treatment Gait belt   left AFO   Activity Tolerance Patient tolerated treatment well    Behavior During Therapy [facility] for tasks assessed/performed           Past Medical History:  Diagnosis Date  . Arthritis   . CHF (congestive heart failure) ([place])   . Combined systolic and diastolic cardiac dysfunction    Echo 11/03/2013 EF 59%, grade 3 diastolic dysfunction  . Hypertension   . NICM (nonischemic cardiomyopathy) ([place])    a. L/RHC (11/05/13): RA: 3, RV 52/5, PA 49/19 (31), PCWP 10, AO 166/93, PA 67%, Fick CO/CI: 5.71/2.97, Lmain: normal, LAD: large, without signficant dz, first diagonal has 20% dz at ostium, LCx: normal, RCA: 30% stenosis at the bifurcation of PDA and PLOM    Past Surgical History:  Procedure Laterality Date  . BIOPSY  01/31/2019   Procedure: BIOPSY;  Surgeon: Otis Brace, MD;  Location: [facility] ENDOSCOPY;  Service: Gastroenterology;;  . ESOPHAGOGASTRODUODENOSCOPY N/A 01/31/2019   Procedure: ESOPHAGOGASTRODUODENOSCOPY (EGD);  Surgeon: Otis Brace, MD;  Location: [facility] ENDOSCOPY;  Service: Gastroenterology;  Laterality: N/A;  . IR FLUORO GUIDE CV LINE RIGHT  07/25/2018  . IR US GUIDE VASC ACCESS RIGHT  07/25/2018  . LEFT AND RIGHT HEART CATHETERIZATION WITH CORONARY ANGIOGRAM N/A 11/05/2013   Procedure: LEFT AND  RIGHT HEART CATHETERIZATION WITH CORONARY ANGIOGRAM;  Surgeon: Peter M Martinique, MD;  Location[facility] CATH LAB;  Service: Cardiovascular;  Laterality: N/A;  . RIGHT HEART CATHETERIZATION N/A 01/14/2014   Procedure: RIGHT HEART CATH;  Surgeon: Larey Dresser, MD;  [facility] LAB;  Service: Cardiovascular;  Laterality: N/A;    There were no vitals filed for this visit.   Subjective Assessment - 12/17/19 0934    Subjective Pt reports he has been doing well. Has been working in yard quit a bit. Has been working on stretches some as well. Still having more spasms in left calf.    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 8/31-9/3 then inpatient rehab 9/3-9/23.PMH: CHF, HTN, hyperlipidemia, glomerulonephritis, pancytopenia/chronic anemia, seizure left-sided post stroke    Patient Stated Goals Pt wants to be able to walk again.    Currently in Pain? No/denies    Pain Onset In the past 7 days                             [facility] Adult PT Treatment/Exercise - 12/17/19 0937      Ambulation/Gait   Ambulation/Gait Yes    Ambulation/Gait Assistance 7: Independent;5: Supervision    Ambulation/Gait Assistance Details supervision in grass and up/down grassy hill. Verbal cues to try to get more left hip extension  with longer right step and be sure to hit with heel on left.    Ambulation Distance (Feet) 1200 Feet    Assistive device None    Gait Pattern Step-through pattern;Decreased step length - right;Decreased stance time - left    Ambulation Surface Level;Unlevel;Outdoor;Paved;Grass    Curb 5: Supervision    Gait Comments Pt performed marching gait over grass 100' supervision. Verbal cues to squeeze bottom during stance to try to control more.      Neuro Re-ed    Neuro Re-ed Details  In // bars: marching gait with holding SLS 1-2 sec each time then backwards steps working on larger steps on left  with cuing 6'  x 4 each. Stepping over 4" foam and back with LLE working on clearing with extension x 10 with tactile cues at pelvis, Standing with RLE on 4" step and airex for increased left weight shift 30 sec x 3 with tactile assist at pelvis to keep straight from PT      Exercises   Exercises Other Exercises    Other Exercises  Standing left hip extension x 10 with verbal and tactile cues for upright form, standing left hip flexor stretch 30 sec x 3.                  PT Education - 12/17/19 1218    Education Details Pt to continue to work on current HEP being sure to focus more on stretching at left hip.    Person(s) Educated Patient    Methods Explanation    Comprehension Verbalized understanding            PT Short Term Goals - 11/27/19 0714      PT SHORT TERM GOAL #1   Title Pt will be able to perform SLS x 5 sec each leg for improved balance and strength.    Baseline 5 sec on right, 1-2 sec on left    Time 4    Period Weeks    Status Partially Met    Target Date 12/27/19      PT SHORT TERM GOAL #2   Title Pt will ambulate 500' on level indoor surfaces without AFO for improved gait and strength independently.    Baseline Pt ambulated >500' on level surfaces without AFO independently on 11/26/19    Time 4    Period Weeks    Status Achieved    Target Date 10/28/19             PT Long Term Goals - 11/27/19 0715      PT LONG TERM GOAL #1   Title Pt will ambulate >1000' without AD  on varied surfaces mod I for improved community mobility.    Baseline mod I on level but supervision on nonlevel without AD with left AFO.    Time 8    Period Weeks    Status On-going    Target Date 01/26/20      PT LONG TERM GOAL #2   Title Pt will increase FGA from 18/30 to >22/30    Baseline 18/30 on 07/31/19, 19/30 on 09/28/19, 19/30 on 11/26/19    Time 8    Period Weeks    Status Not Met    Target Date 01/26/20      PT LONG TERM GOAL #3   Title Pt will ambulate up/down  4 steps in reciprocal pattern without railing independently for improved functional strength and balance.    Baseline able to perform reciprocal  pattern on steps with 1 railing. CGA and step-to pattern with descent without railing    Time 8    Period Weeks    Status On-going    Target Date 01/26/20      PT LONG TERM GOAL #4   Title Pt will increase gait speed to 1.2 m/s or higher for normal, safe community ambulator speed without AD.    Baseline without AD=0.102ms, 1.164m without AD on 09/28/19, 11/26/19 without AFO comfortable=0.8869mand 1.24m76mast    Time 8    Period Weeks    Status On-going    Target Date 01/26/20                 Plan - 12/17/19 1220    Clinical Impression Statement PT continued to focus on gait without AFO or AD on varied surfaces today. Pt continues to show improvement in ankle stability. Still has left hip weakness and challenged with increasing SLS on left.    Personal Factors and Comorbidities Comorbidity 3+    Comorbidities CHF, HTN, hyperlipidemia, glomerulonephritis, pancytopenia/chronic anemia, seizure    Examination-Activity Limitations Stairs;Stand;Squat;Locomotion Level;Transfers;Bed Mobility    Examination-Participation Restrictions Community Activity;Shop;Driving;Cleaning;Yard Work    StabMerchant navy officerlving/Moderate complexity    Rehab Potential Good    PT Frequency 1x / week    PT Duration 8 weeks    PT Treatment/Interventions DME Instruction;ADLs/Self Care Home Management;Gait training;Stair training;Functional mobility training;Therapeutic activities;Therapeutic exercise;Balance training;Orthotic Fit/Training;Patient/family education;Neuromuscular re-education;Wheelchair mobility training;Passive range of motion;Manual techniques    PT Next Visit Plan Update target dates on goals due to delay in scheduling. Hip strengthening and flexibility with focus on hip flexor muscle length and hamstring. Needs to improve hip  extension and PF strength for better push off with gait. Continue BWS over treadmill without AFO. Can we remove UE suppport?  Gait on level surfaces without AFO. Resisted gait, and add in more dual task activities as well. Continue dynamic balance activities and dynamic gait overground    PT Home Exercise Plan Access Code: [email or web address]    Consulted and Agree with Plan of Care Patient           Patient will benefit from skilled therapeutic intervention in order to improve the following deficits and impairments:  Abnormal gait, Difficulty walking, Decreased balance, Decreased mobility, Decreased strength, Decreased knowledge of use of DME, Decreased endurance, Decreased activity tolerance, Decreased range of motion, Pain, Increased muscle spasms  Visit Diagnosis: Other abnormalities of gait and mobility  Muscle weakness (generalized)  Hemiplegia and hemiparesis following cerebral infarction affecting left non-dominant side ([facility]  Problem List Patient Active Problem List   Diagnosis Date Noted  . Essential hypertension 05/25/2019  . 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 Acute blood loss anemia   . Steroid-induced hyperglycemia   . Acute on chronic anemia   . Seizures (HCC)[place]. Spastic hemiparesis (HCC)[place]. ANCA-associated vasculitis (HCC)[place]. Hypertension   . Thrombocytopenia (HCC)[place]. Acute unilateral cerebral infarction in a watershed distribution ([facility]/07/2018  . Weakness   . CVA (cerebral vascular accident) (HCC)[place]/31/2020  . Malnutrition of moderate degree 07/31/2018  . Elevated serum protein level   . Multiple myeloma (HCC)[place]. Pancytopenia (HCC)[place]. Normocytic normochromic anemia 07/16/2018  . Acute kidney failure (HCC)[place]/08/2018  . NICM (nonischemic cardiomyopathy) (HCC)[place]/06/2013  . History of ETOH abuse 11/12/2013  . Chronic combined systolic and diastolic CHF (congestive heart failure) (HCC)[place]/06/2013  .  Congestive dilated cardiomyopathy ([place]) 11/04/2013  . Malignant  hypertension     Electa Sniff, PT, DPT, NCS 12/17/2019, 12:23 PM  [facility] [address] [place], [place], [address] Phone: [phone]   Fax:  [phone]  Name: Jeremy Grant MRN: [id number] Date of Birth: 1960-06-17

## 2019-12-24 ENCOUNTER — Ambulatory Visit: Payer: 59

## 2019-12-24 ENCOUNTER — Other Ambulatory Visit: Payer: Self-pay

## 2019-12-24 VITALS — BP 142/80

## 2019-12-24 DIAGNOSIS — M6281 Muscle weakness (generalized): Secondary | ICD-10-CM

## 2019-12-24 DIAGNOSIS — R2689 Other abnormalities of gait and mobility: Secondary | ICD-10-CM | POA: Diagnosis not present

## 2019-12-24 DIAGNOSIS — I69354 Hemiplegia and hemiparesis following cerebral infarction affecting left non-dominant side: Secondary | ICD-10-CM

## 2019-12-24 NOTE — Therapy (Signed)
[facility] [address] [place], [place], [address] Phone: [phone]   Fax:  [phone]  Physical Therapy Treatment  Patient Details  Name: Jeremy Grant MRN: [id number] Date of Birth: 1961/03/25 Referring Provider (PT): Dr. Letta Pate   Encounter Date: 12/24/2019   PT End of Session - 12/24/19 0934    Visit Number 65    Number of Visits 21    Date for PT Re-Evaluation 08/67/61   cert 90 days but poc 60 days   Authorization Type [employer] (Pt has recent change in insurance)    PT Start Time 0932    PT Stop Time 1014    PT Time Calculation (min) 42 min    Equipment Utilized During Treatment Gait belt   left AFO   Activity Tolerance Patient tolerated treatment well    Behavior During Therapy [facility] for tasks assessed/performed           Past Medical History:  Diagnosis Date  . Arthritis   . CHF (congestive heart failure) ([place])   . Combined systolic and diastolic cardiac dysfunction    Echo 11/03/2013 EF 95%, grade 3 diastolic dysfunction  . Hypertension   . NICM (nonischemic cardiomyopathy) ([place])    a. L/RHC (11/05/13): RA: 3, RV 52/5, PA 49/19 (31), PCWP 10, AO 166/93, PA 67%, Fick CO/CI: 5.71/2.97, Lmain: normal, LAD: large, without signficant dz, first diagonal has 20% dz at ostium, LCx: normal, RCA: 30% stenosis at the bifurcation of PDA and PLOM    Past Surgical History:  Procedure Laterality Date  . BIOPSY  01/31/2019   Procedure: BIOPSY;  Surgeon: Otis Brace, MD;  Location: [facility] ENDOSCOPY;  Service: Gastroenterology;;  . ESOPHAGOGASTRODUODENOSCOPY N/A 01/31/2019   Procedure: ESOPHAGOGASTRODUODENOSCOPY (EGD);  Surgeon: Otis Brace, MD;  Location: [facility] ENDOSCOPY;  Service: Gastroenterology;  Laterality: N/A;  . IR FLUORO GUIDE CV LINE RIGHT  07/25/2018  . IR US GUIDE VASC ACCESS RIGHT  07/25/2018  . LEFT AND RIGHT HEART CATHETERIZATION WITH CORONARY ANGIOGRAM N/A 11/05/2013   Procedure: LEFT AND  RIGHT HEART CATHETERIZATION WITH CORONARY ANGIOGRAM;  Surgeon: Peter M Martinique, MD;  Location: [facility] CATH LAB;  Service: Cardiovascular;  Laterality: N/A;  . RIGHT HEART CATHETERIZATION N/A 01/14/2014   Procedure: RIGHT HEART CATH;  Surgeon: Larey Dresser, MD;  Location: [facility] CATH LAB;  Service: Cardiovascular;  Laterality: N/A;    Vitals:   12/24/19 0936  BP: (!) 142/80     Subjective Assessment - 12/24/19 0935    Subjective Pt reports he has been doing a lot of yard work and house cleaning. Even push mowed lawn as well. Took yesterday off.    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 8/31-9/3 then inpatient rehab 9/3-9/23.PMH: CHF, HTN, hyperlipidemia, glomerulonephritis, pancytopenia/chronic anemia, seizure left-sided post stroke    Patient Stated Goals Pt wants to be able to walk again.    Currently in Pain? No/denies    Pain Onset In the past 7 days                             [facility] Adult PT Treatment/Exercise - 12/24/19 0932      Ambulation/Gait   Ambulation/Gait Yes    Ambulation/Gait Assistance 5: Supervision    Ambulation/Gait Assistance Details Pt was cued to increase left heel strike and right step length as well as squeeze bottom. Tactile cues for some at  pelvis to help. Performed marching during 100'    Ambulation Distance (Feet) 850 Feet    Assistive device None    Gait Pattern Step-through pattern;Decreased stance time - left    Ambulation Surface Level;Unlevel;Indoor;Outdoor;Paved;Grass      Neuro Re-ed    Neuro Re-ed Details  In // bars without AFO: stepping over and back with LLE on foam beam x 10 working on large steps and hip extension with PT stabilizing at trunk with light UE support then repeated x 10 with foam beam on 4" side x 10. Then performed with RUE only with stepping forward and back with right leg with visual cues in mirror to work on left weight shift.  Reciprocal steps over 4 beams forward and back with RUE support x 5 laps. Standing on soft blue foam beam x 30 sec without UE support, step-up on foam beam x 10 with LLE without UE support then alternating toe taps on cone from foam beam x 10 CGA. SLS with right foot on cone 30 sec x 2 CGA.                    PT Short Term Goals - 12/24/19 1136      PT SHORT TERM GOAL #1   Title Pt will be able to perform SLS x 5 sec each leg for improved balance and strength.    Baseline 5 sec on right, 1-2 sec on left    Time 4    Period Weeks    Status Partially Met    Target Date 01/07/20   updated due to delay in scheduling     PT SHORT TERM GOAL #2   Title Pt will ambulate 500' on level indoor surfaces without AFO for improved gait and strength independently.    Baseline Pt ambulated >500' on level surfaces without AFO independently on 11/26/19    Time 4    Period Weeks    Status Achieved    Target Date 10/28/19             PT Long Term Goals - 12/24/19 1137      PT LONG TERM GOAL #1   Title Pt will ambulate >1000' without AD  on varied surfaces mod I for improved community mobility.    Baseline mod I on level but supervision on nonlevel without AD with left AFO.    Time 8    Period Weeks    Status On-going    Target Date 02/05/20   updated due to delay in scheduling     PT LONG TERM GOAL #2   Title Pt will increase FGA from 18/30 to >22/30    Baseline 18/30 on 07/31/19, 19/30 on 09/28/19, 19/30 on 11/26/19    Time 8    Period Weeks    Status Not Met    Target Date 02/05/20      PT LONG TERM GOAL #3   Title Pt will ambulate up/down 4 steps in reciprocal pattern without railing independently for improved functional strength and balance.    Baseline able to perform reciprocal pattern on steps with 1 railing. CGA and step-to pattern with descent without railing    Time 8    Period Weeks    Status On-going    Target Date 02/05/20      PT LONG TERM GOAL #4   Title Pt will  increase gait speed to 1.2 m/s or higher for normal, safe community ambulator speed without AD.  Baseline without AD=0.67ms, 1.158m without AD on 09/28/19, 11/26/19 without AFO comfortable=0.8841mand 1.10m54mast    Time 8    Period Weeks    Status On-going    Target Date 02/05/20                 Plan - 12/24/19 1139    Clinical Impression Statement PT continued to focus on increasing left SLS time and hip extension with balance and gait activities without AFO. Pt challenged with this but did improve with practice with better control. Is showing more hip extension with backwards steps.    Personal Factors and Comorbidities Comorbidity 3+    Comorbidities CHF, HTN, hyperlipidemia, glomerulonephritis, pancytopenia/chronic anemia, seizure    Examination-Activity Limitations Stairs;Stand;Squat;Locomotion Level;Transfers;Bed Mobility    Examination-Participation Restrictions Community Activity;Shop;Driving;Cleaning;Yard Work    StabMerchant navy officerlving/Moderate complexity    Rehab Potential Good    PT Frequency 1x / week    PT Duration 8 weeks    PT Treatment/Interventions DME Instruction;ADLs/Self Care Home Management;Gait training;Stair training;Functional mobility training;Therapeutic activities;Therapeutic exercise;Balance training;Orthotic Fit/Training;Patient/family education;Neuromuscular re-education;Wheelchair mobility training;Passive range of motion;Manual techniques    PT Next Visit Plan Hip strengthening and flexibility with focus on hip flexor muscle length and hamstring. Needs to improve hip extension and PF strength for better push off with gait. Continue BWS over treadmill without AFO. Can we remove UE suppport?  Gait on level surfaces without AFO. Resisted gait, and add in more dual task activities as well. Continue dynamic balance activities and dynamic gait overground    PT Home Exercise Plan Access Code: [email or web address]    Consulted and Agree with Plan of  Care Patient           Patient will benefit from skilled therapeutic intervention in order to improve the following deficits and impairments:  Abnormal gait, Difficulty walking, Decreased balance, Decreased mobility, Decreased strength, Decreased knowledge of use of DME, Decreased endurance, Decreased activity tolerance, Decreased range of motion, Pain, Increased muscle spasms  Visit Diagnosis: Other abnormalities of gait and mobility  Muscle weakness (generalized)  Hemiplegia and hemiparesis following cerebral infarction affecting left non-dominant side ([facility]  Problem List Patient Active Problem List   Diagnosis Date Noted  . Essential hypertension 05/25/2019  . 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 Acute blood loss anemia   . Steroid-induced hyperglycemia   . Acute on chronic anemia   . Seizures (HCC)[place]. Spastic hemiparesis (HCC)[place]. 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 Hypertension   . Thrombocytopenia (HCC)[place]. [facility] unilateral cerebral [facility] in a watershed distribution ([facility]/07/2018  . Weakness   . CVA (cerebral vascular accident) (HCC)[place]/31/2020  . Malnutrition of moderate degree 07/31/2018  . Elevated serum protein level   . Multiple myeloma (HCC)[place]. Pancytopenia (HCC)[place]. Normocytic normochromic anemia 07/16/2018  . Acute kidney failure (HCC)[place]/08/2018  . NICM (nonischemic cardiomyopathy) (HCC)[place]/06/2013  . History of ETOH abuse 11/12/2013  . Chronic combined systolic and diastolic CHF (congestive heart failure) (HCC)[place]/06/2013  . Congestive dilated cardiomyopathy (HCC)[place]/24/2015  . Malignant hypertension     EmilElecta Sniff, DPT, NCS 12/24/2019, 11:41 AM  [facility] [address], [id number]: [phone]:  [phone]: DeweLIANDRO THELIN: [id number] of Birth: [birth year]

## 2019-12-31 ENCOUNTER — Ambulatory Visit: Payer: 59

## 2019-12-31 ENCOUNTER — Other Ambulatory Visit: Payer: Self-pay

## 2019-12-31 VITALS — BP 162/88

## 2019-12-31 DIAGNOSIS — M6281 Muscle weakness (generalized): Secondary | ICD-10-CM

## 2019-12-31 DIAGNOSIS — R2689 Other abnormalities of gait and mobility: Secondary | ICD-10-CM | POA: Diagnosis not present

## 2019-12-31 DIAGNOSIS — I69354 Hemiplegia and hemiparesis following cerebral infarction affecting left non-dominant side: Secondary | ICD-10-CM

## 2019-12-31 NOTE — Therapy (Signed)
[facility] [address] [place], [place], [address] Phone: [phone]   Fax:  [phone]  Physical Therapy Treatment  Patient Details  Name: Jeremy Grant MRN: [id number] Date of Birth: Nov 14, 1960 Referring Provider (PT): Dr. Letta Pate   Encounter Date: 12/31/2019   PT End of Session - 12/31/19 0935    Visit Number 12    Number of Visits 52    Date for PT Re-Evaluation 75/17/00   cert 90 days but poc 60 days   Authorization Type [employer] (Pt has recent change in insurance)    PT Start Time 0932    PT Stop Time 1015    PT Time Calculation (min) 43 min    Equipment Utilized During Treatment Gait belt   left AFO   Activity Tolerance Patient tolerated treatment well    Behavior During Therapy [facility] for tasks assessed/performed           Past Medical History:  Diagnosis Date  . Arthritis   . CHF (congestive heart failure) ([place])   . Combined systolic and diastolic cardiac dysfunction    Echo 11/03/2013 EF 17%, grade 3 diastolic dysfunction  . Hypertension   . NICM (nonischemic cardiomyopathy) ([place])    a. L/RHC (11/05/13): RA: 3, RV 52/5, PA 49/19 (31), PCWP 10, AO 166/93, PA 67%, Fick CO/CI: 5.71/2.97, Lmain: normal, LAD: large, without signficant dz, first diagonal has 20% dz at ostium, LCx: normal, RCA: 30% stenosis at the bifurcation of PDA and PLOM    Past Surgical History:  Procedure Laterality Date  . BIOPSY  01/31/2019   Procedure: BIOPSY;  Surgeon: Otis Brace, MD;  Location: [facility] ENDOSCOPY;  Service: Gastroenterology;;  . ESOPHAGOGASTRODUODENOSCOPY N/A 01/31/2019   Procedure: ESOPHAGOGASTRODUODENOSCOPY (EGD);  Surgeon: Otis Brace, MD;  Location: [facility] ENDOSCOPY;  Service: Gastroenterology;  Laterality: N/A;  . IR FLUORO GUIDE CV LINE RIGHT  07/25/2018  . IR US GUIDE VASC ACCESS RIGHT  07/25/2018  . LEFT AND RIGHT HEART CATHETERIZATION WITH CORONARY ANGIOGRAM N/A 11/05/2013   Procedure: LEFT AND  RIGHT HEART CATHETERIZATION WITH CORONARY ANGIOGRAM;  Surgeon: Peter M Martinique, MD;  Location[facility] CATH LAB;  Service: Cardiovascular;  Laterality: N/A;  . RIGHT HEART CATHETERIZATION N/A 01/14/2014   Procedure: RIGHT HEART CATH;  Surgeon: Larey Dresser, MD;  Location: [facility] CATH LAB;  Service: Cardiovascular;  Laterality: N/A;    Vitals:   12/31/19 0941  BP: (!) 162/88     Subjective Assessment - 12/31/19 0936    Subjective Pt reports that he was walking outside some without cane and without AD. He did not bring cane in today. He even went up grassy hill. Saw PCP yesterday and no changes.    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 8/31-9/3 then inpatient rehab 9/3-9/23.PMH: CHF, HTN, hyperlipidemia, glomerulonephritis, pancytopenia/chronic anemia, seizure left-sided post stroke    Patient Stated Goals Pt wants to be able to walk again.    Currently in Pain? No/denies    Pain Onset In the past 7 days                             [facility] Adult PT Treatment/Exercise - 12/31/19 4944      Ambulation/Gait   Ambulation/Gait Yes    Ambulation/Gait Assistance 5: Supervision    Ambulation/Gait Assistance Details Verbal cues for larger steps and more heel strike on left.  Ambulation Distance (Feet) 230 Feet    Assistive device None    Gait Pattern Step-through pattern    Ambulation Surface Level;Indoor      Neuro Re-ed    Neuro Re-ed Details  In // bars: step-ups with LLE on rockerboard positioned ant/post x 10 without UE support then with airex on top of it x 10 with finger tip support on right occasionally. Alternating toe taps on cone standing on rockerboard with airex with finger tip support x 10. Standing on airex on floor alternating toe taps on cone without UE support x 10. Standing on airex and stepping back back with LLE working on hip extension then back up on airex without UE support.  PT blocking pelvis to prevent rotation to try to get more hip extension. Pt caught left toe a couple times coming back up. CGA with all activities. In hallway: backwards gait 40' x 4 with CGA with verbal cues to increase step length.      Exercises   Exercises Other Exercises      Knee/Hip Exercises: Aerobic   Elliptical x 3 min. Pt reported legs very tired after. BP=170/86 after                    PT Short Term Goals - 12/24/19 1136      PT SHORT TERM GOAL #1   Title Pt will be able to perform SLS x 5 sec each leg for improved balance and strength.    Baseline 5 sec on right, 1-2 sec on left    Time 4    Period Weeks    Status Partially Met    Target Date 01/07/20   updated due to delay in scheduling     PT SHORT TERM GOAL #2   Title Pt will ambulate 500' on level indoor surfaces without AFO for improved gait and strength independently.    Baseline Pt ambulated >500' on level surfaces without AFO independently on 11/26/19    Time 4    Period Weeks    Status Achieved    Target Date 10/28/19             PT Long Term Goals - 12/24/19 1137      PT LONG TERM GOAL #1   Title Pt will ambulate >1000' without AD  on varied surfaces mod I for improved community mobility.    Baseline mod I on level but supervision on nonlevel without AD with left AFO.    Time 8    Period Weeks    Status On-going    Target Date 02/05/20   updated due to delay in scheduling     PT LONG TERM GOAL #2   Title Pt will increase FGA from 18/30 to >22/30    Baseline 18/30 on 07/31/19, 19/30 on 09/28/19, 19/30 on 11/26/19    Time 8    Period Weeks    Status Not Met    Target Date 02/05/20      PT LONG TERM GOAL #3   Title Pt will ambulate up/down 4 steps in reciprocal pattern without railing independently for improved functional strength and balance.    Baseline able to perform reciprocal pattern on steps with 1 railing. CGA and step-to pattern with descent without railing    Time 8    Period  Weeks    Status On-going    Target Date 02/05/20      PT LONG TERM GOAL #4   Title Pt will increase  gait speed to 1.2 m/s or higher for normal, safe community ambulator speed without AD.    Baseline without AD=0.66ms, 1.152m without AD on 09/28/19, 11/26/19 without AFO comfortable=0.8822mand 1.44m2mast    Time 8    Period Weeks    Status On-going    Target Date 02/05/20                 Plan - 12/31/19 1338    Clinical Impression Statement PT continued to work on gait and balance without AFO. Pt showing improving left hip extension with backwards gait today. Trialed elliptical for first time and pt fatigued quickly.    Personal Factors and Comorbidities Comorbidity 3+    Comorbidities CHF, HTN, hyperlipidemia, glomerulonephritis, pancytopenia/chronic anemia, seizure    Examination-Activity Limitations Stairs;Stand;Squat;Locomotion Level;Transfers;Bed Mobility    Examination-Participation Restrictions Community Activity;Shop;Driving;Cleaning;Yard Work    StabMerchant navy officerlving/Moderate complexity    Rehab Potential Good    PT Frequency 1x / week    PT Duration 8 weeks    PT Treatment/Interventions DME Instruction;ADLs/Self Care Home Management;Gait training;Stair training;Functional mobility training;Therapeutic activities;Therapeutic exercise;Balance training;Orthotic Fit/Training;Patient/family education;Neuromuscular re-education;Wheelchair mobility training;Passive range of motion;Manual techniques    PT Next Visit Plan Hip strengthening and flexibility with focus on hip flexor muscle length and hamstring. Needs to improve hip extension and PF strength for better push off with gait. Continue BWS over treadmill without AFO. Can we remove UE suppport?  Gait on level surfaces without AFO. Resisted gait, and add in more dual task activities as well. Continue dynamic balance activities and dynamic gait overground    PT Home Exercise Plan Access Code: [email or web address]     Consulted and Agree with Plan of Care Patient           Patient will benefit from skilled therapeutic intervention in order to improve the following deficits and impairments:  Abnormal gait, Difficulty walking, Decreased balance, Decreased mobility, Decreased strength, Decreased knowledge of use of DME, Decreased endurance, Decreased activity tolerance, Decreased range of motion, Pain, Increased muscle spasms  Visit Diagnosis: Other abnormalities of gait and mobility  Muscle weakness (generalized)  Hemiplegia and hemiparesis following cerebral infarction affecting left non-dominant side ([facility]  Problem List Patient Active Problem List   Diagnosis Date Noted  . Essential hypertension 05/25/2019  . 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 Acute blood loss anemia   . Steroid-induced hyperglycemia   . Acute on chronic anemia   . Seizures (HCC)[place]. Spastic hemiparesis (HCC)[place]. 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 Hypertension   . Thrombocytopenia (HCC)[place]. Acute unilateral cerebral infarction in a watershed distribution ([facility]/07/2018  . Weakness   . CVA (cerebral vascular accident) (HCC)[place]/31/2020  . Malnutrition of moderate degree 07/31/2018  . Elevated serum protein level   . Multiple myeloma (HCC)[place]. Pancytopenia (HCC)[place]. Normocytic normochromic anemia 07/16/2018  . Acute kidney failure (HCC)[place]/08/2018  . NICM (nonischemic cardiomyopathy) (HCC)[place]/06/2013  . History of ETOH abuse 11/12/2013  . Chronic combined systolic and diastolic CHF (congestive heart failure) (HCC)[place]/06/2013  . Congestive dilated cardiomyopathy (HCC)[place]/24/2015  . Malignant hypertension     Jeremy Grant, DPT, NCS 12/31/2019, 1:40 PM  [facility] [address], [id number]: [phone]:  [phone]: DeweCARDELL RACHEL: [id number] of Birth: 7/31Oct 21, 1962

## 2020-01-07 ENCOUNTER — Other Ambulatory Visit: Payer: Self-pay

## 2020-01-07 ENCOUNTER — Ambulatory Visit: Payer: 59

## 2020-01-07 DIAGNOSIS — R2689 Other abnormalities of gait and mobility: Secondary | ICD-10-CM

## 2020-01-07 DIAGNOSIS — M6281 Muscle weakness (generalized): Secondary | ICD-10-CM

## 2020-01-07 DIAGNOSIS — I69354 Hemiplegia and hemiparesis following cerebral infarction affecting left non-dominant side: Secondary | ICD-10-CM

## 2020-01-07 NOTE — Therapy (Signed)
[facility] [address] [place], [place], [address] Phone: [phone]   Fax:  [phone]  Physical Therapy Treatment  Patient Details  Name: Jeremy Grant MRN: [id number] Date of Birth: 1960/11/18 Referring Provider (PT): Dr. Letta Pate   Encounter Date: 01/07/2020   PT End of Session - 01/07/20 0932    Visit Number 48    Number of Visits 49    Date for PT Re-Evaluation 27/78/24   cert 90 days but poc 60 days   Authorization Type [employer] (Pt has recent change in insurance)    PT Start Time [phone]    PT Stop Time 1014    PT Time Calculation (min) 43 min    Equipment Utilized During Treatment Gait belt    Activity Tolerance Patient tolerated treatment well    Behavior During Therapy [facility] for tasks assessed/performed           Past Medical History:  Diagnosis Date  . Arthritis   . CHF (congestive heart failure) ([place])   . Combined systolic and diastolic cardiac dysfunction    Echo 11/03/2013 EF 61%, grade 3 diastolic dysfunction  . Hypertension   . NICM (nonischemic cardiomyopathy) ([place])    a. L/RHC (11/05/13): RA: 3, RV 52/5, PA 49/19 (31), PCWP 10, AO 166/93, PA 67%, Fick CO/CI: 5.71/2.97, Lmain: normal, LAD: large, without signficant dz, first diagonal has 20% dz at ostium, LCx: normal, RCA: 30% stenosis at the bifurcation of PDA and PLOM    Past Surgical History:  Procedure Laterality Date  . BIOPSY  01/31/2019   Procedure: BIOPSY;  Surgeon: Otis Brace, MD;  Location: [facility] ENDOSCOPY;  Service: Gastroenterology;;  . ESOPHAGOGASTRODUODENOSCOPY N/A 01/31/2019   Procedure: ESOPHAGOGASTRODUODENOSCOPY (EGD);  Surgeon: Otis Brace, MD;  Location: [facility] ENDOSCOPY;  Service: Gastroenterology;  Laterality: N/A;  . IR FLUORO GUIDE CV LINE RIGHT  07/25/2018  . IR US GUIDE VASC ACCESS RIGHT  07/25/2018  . LEFT AND RIGHT HEART CATHETERIZATION WITH CORONARY ANGIOGRAM N/A 11/05/2013   Procedure: LEFT AND RIGHT  HEART CATHETERIZATION WITH CORONARY ANGIOGRAM;  Surgeon: Peter M Martinique, MD;  Location: [facility] CATH LAB;  Service: Cardiovascular;  Laterality: N/A;  . RIGHT HEART CATHETERIZATION N/A 01/14/2014   Procedure: RIGHT HEART CATH;  Surgeon: Larey Dresser, MD;  Location: [facility] CATH LAB;  Service: Cardiovascular;  Laterality: N/A;    There were no vitals filed for this visit.   Subjective Assessment - 01/07/20 0933    Subjective Pt reports that he was really sore in quads after doing the elliptical last time for a couple days. Muscle soreness. Doing good today.    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 8/31-9/3 then inpatient rehab 9/3-9/23.PMH: CHF, HTN, hyperlipidemia, glomerulonephritis, pancytopenia/chronic anemia, seizure left-sided post stroke    Patient Stated Goals Pt wants to be able to walk again.    Currently in Pain? No/denies    Pain Onset In the past 7 days                             [facility] Adult PT Treatment/Exercise - 01/07/20 0934      Ambulation/Gait   Ambulation/Gait Yes    Ambulation/Gait Assistance 5: Supervision    Ambulation/Gait Assistance Details Verbal cues to increase step length and left heel strike.    Ambulation Distance (Feet) 230 Feet    Assistive device None  Gait Pattern Step-through pattern;Decreased step length - right    Ambulation Surface Level;Indoor    Gait Comments BWS over treadmill x 7 min without UE support at 1. with PT providing initial tactile support at pelvis for stability. Verbal cues to increase right step length and try to set feet down gently. Then switched to gait trainer x 6 min at 1.55mph. BP=150/86 after. BWS was utilized just for safety with no unweighting. From gait trainer pt was walking 0.73m/s, average step length left=118cm and right=98cm, coefficient of variation: left=16% and right=33%, time on each foot: left=54% and right=46%       Neuro Re-ed    Neuro Re-ed Details  In // bars: stepping over and back x 10 with each leg with 1 fingertip support working on large steps back when doing on left as well CGA. Standing on 2x4 black foam x 1 min then step-ups on beam with LLE x 10 with fingertip support on right.                    PT Short Term Goals - 12/24/19 1136      PT SHORT TERM GOAL #1   Title Pt will be able to perform SLS x 5 sec each leg for improved balance and strength.    Baseline 5 sec on right, 1-2 sec on left    Time 4    Period Weeks    Status Partially Met    Target Date 01/07/20   updated due to delay in scheduling     PT SHORT TERM GOAL #2   Title Pt will ambulate 500' on level indoor surfaces without AFO for improved gait and strength independently.    Baseline Pt ambulated >500' on level surfaces without AFO independently on 11/26/19    Time 4    Period Weeks    Status Achieved    Target Date 10/28/19             PT Long Term Goals - 12/24/19 1137      PT LONG TERM GOAL #1   Title Pt will ambulate >1000' without AD  on varied surfaces mod I for improved community mobility.    Baseline mod I on level but supervision on nonlevel without AD with left AFO.    Time 8    Period Weeks    Status On-going    Target Date 02/05/20   updated due to delay in scheduling     PT LONG TERM GOAL #2   Title Pt will increase FGA from 18/30 to >22/30    Baseline 18/30 on 07/31/19, 19/30 on 09/28/19, 19/30 on 11/26/19    Time 8    Period Weeks    Status Not Met    Target Date 02/05/20      PT LONG TERM GOAL #3   Title Pt will ambulate up/down 4 steps in reciprocal pattern without railing independently for improved functional strength and balance.    Baseline able to perform reciprocal pattern on steps with 1 railing. CGA and step-to pattern with descent without railing    Time 8    Period Weeks    Status On-going    Target Date 02/05/20      PT LONG TERM GOAL #4   Title Pt will  increase gait speed to 1.2 m/s or higher for normal, safe community ambulator speed without AD.    Baseline without AD=0.42m/s, 1.74m/s without AD on 09/28/19, 11/26/19 without AFO comfortable=0.40m/s and 1.56m/s fast  Time 8    Period Weeks    Status On-going    Target Date 02/05/20                 Plan - 01/07/20 1330    Clinical Impression Statement Pt was able ambulate on treadmill today in BWS only for safety without AFO without UE support for first time. Utilized Administrator, arts and objectively showing he has decreased right step length compared to left.    Personal Factors and Comorbidities Comorbidity 3+    Comorbidities CHF, HTN, hyperlipidemia, glomerulonephritis, pancytopenia/chronic anemia, seizure    Examination-Activity Limitations Stairs;Stand;Squat;Locomotion Level;Transfers;Bed Mobility    Examination-Participation Restrictions Community Activity;Shop;Driving;Cleaning;Yard Work    [employer]chant [employer] officer Evolving/Moderate complexity    Rehab Potential Good    PT Frequency 1x / week    PT Duration 8 weeks    PT Treatment/Interventions DME Instruction;ADLs/Self Care Home Management;Gait training;Stair training;Functional mobility training;Therapeutic activities;Therapeutic exercise;Balance training;Orthotic Fit/Training;Patient/family education;Neuromuscular re-education;Wheelchair mobility training;Passive range of motion;Manual techniques    PT Next Visit Plan Check STG next visit. Hip strengthening and flexibility with focus on hip flexor muscle length and hamstring. Needs to improve hip extension and PF strength for better push off with gait. Continue BWS over treadmill without AFO.  Gait on varied surfaces without AFO. Resisted gait, and add in more dual task activities as well. Continue dynamic balance activities and dynamic gait overground    PT Home Exercise Plan Access Code: [email or web address]    Consulted and Agree with Plan of Care Patient           Patient  will benefit from skilled therapeutic intervention in order to improve the following deficits and impairments:  Abnormal gait, Difficulty walking, Decreased balance, Decreased mobility, Decreased strength, Decreased knowledge of use of DME, Decreased endurance, Decreased activity tolerance, Decreased range of motion, Pain, Increased muscle spasms  Visit Diagnosis: Other abnormalities of gait and mobility  Muscle weakness (generalized)  Hemiplegia and hemiparesis following cerebral infarction affecting left non-dominant side [facility])     Problem List Patient Active Problem List   Diagnosis Date Noted  . Essential hypertension 05/25/2019  . 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 Acute blood loss anemia   . Steroid-induced hyperglycemia   . Acute on chronic anemia   . Seizures ([place])   . Spastic hemiparesis ([place])   . 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 Hypertension   . Thrombocytopenia ([place])   . Acute unilateral cerebral infarction in a watershed distribution [facility]) 01/15/2019  . Weakness   . CVA (cerebral vascular accident) ([place]) 01/12/2019  . Malnutrition of moderate degree 07/31/2018  . Elevated serum protein level   . Multiple myeloma ([place])   . Pancytopenia ([place])   . Normocytic normochromic anemia 07/16/2018  . Acute kidney failure ([place]) 07/16/2018  . NICM (nonischemic cardiomyopathy) ([place]) 11/12/2013  . History of ETOH abuse 11/12/2013  . Chronic combined systolic and diastolic CHF (congestive heart failure) ([place]) 11/12/2013  . Congestive dilated cardiomyopathy ([place]) 11/04/2013  . Malignant hypertension     Electa Sniff, PT, DPT, NCS 01/07/2020, 1:33 PM  [facility] [address] [place], [place], [address] Phone: [phone]   Fax:  [phone]  Name: Jeremy Grant MRN: [id number] Date of Birth: 08-01-1960

## 2020-01-15 ENCOUNTER — Ambulatory Visit: Payer: 59 | Attending: Physical Medicine & Rehabilitation

## 2020-01-15 ENCOUNTER — Other Ambulatory Visit: Payer: Self-pay

## 2020-01-15 DIAGNOSIS — M6281 Muscle weakness (generalized): Secondary | ICD-10-CM | POA: Insufficient documentation

## 2020-01-15 DIAGNOSIS — R2689 Other abnormalities of gait and mobility: Secondary | ICD-10-CM | POA: Insufficient documentation

## 2020-01-15 DIAGNOSIS — I69354 Hemiplegia and hemiparesis following cerebral infarction affecting left non-dominant side: Secondary | ICD-10-CM | POA: Diagnosis present

## 2020-01-15 NOTE — Therapy (Signed)
[facility] [address] [place], [place], [address] Phone: [phone]   Fax:  [phone]  Physical Therapy Treatment  Patient Details  Name: Jeremy Grant MRN: [id number] Date of Birth: 1961-04-03 Referring Provider (PT): Dr. Letta Pate   Encounter Date: 01/15/2020   PT End of Session - 01/15/20 1018    Visit Number 7    Number of Visits 44    Date for PT Re-Evaluation 70/35/00   cert 90 days but poc 60 days   Authorization Type [employer] (Pt has recent change in insurance)    PT Start Time 1016    PT Stop Time 1100    PT Time Calculation (min) 44 min    Equipment Utilized During Treatment Gait belt    Activity Tolerance Patient tolerated treatment well    Behavior During Therapy WFL for tasks assessed/performed           Past Medical History:  Diagnosis Date  . Arthritis   . CHF (congestive heart failure) ([place])   . Combined systolic and diastolic cardiac dysfunction    Echo 11/03/2013 EF 93%, grade 3 diastolic dysfunction  . Hypertension   . NICM (nonischemic cardiomyopathy) ([place])    a. L/RHC (11/05/13): RA: 3, RV 52/5, PA 49/19 (31), PCWP 10, AO 166/93, PA 67%, Fick CO/CI: 5.71/2.97, Lmain: normal, LAD: large, without signficant dz, first diagonal has 20% dz at ostium, LCx: normal, RCA: 30% stenosis at the bifurcation of PDA and PLOM    Past Surgical History:  Procedure Laterality Date  . BIOPSY  01/31/2019   Procedure: BIOPSY;  Surgeon: Otis Brace, MD;  Location: [facility] ENDOSCOPY;  Service: Gastroenterology;;  . ESOPHAGOGASTRODUODENOSCOPY N/A 01/31/2019   Procedure: ESOPHAGOGASTRODUODENOSCOPY (EGD);  Surgeon: Otis Brace, MD;  Location: [facility] ENDOSCOPY;  Service: Gastroenterology;  Laterality: N/A;  . IR FLUORO GUIDE CV LINE RIGHT  07/25/2018  . IR US GUIDE VASC ACCESS RIGHT  07/25/2018  . LEFT AND RIGHT HEART CATHETERIZATION WITH CORONARY ANGIOGRAM N/A 11/05/2013   Procedure: LEFT AND RIGHT  HEART CATHETERIZATION WITH CORONARY ANGIOGRAM;  Surgeon: Peter M Martinique, MD;  Location: [facility] CATH LAB;  Service: Cardiovascular;  Laterality: N/A;  . RIGHT HEART CATHETERIZATION N/A 01/14/2014   Procedure: RIGHT HEART CATH;  Surgeon: Larey Dresser, MD;  Location: [facility] CATH LAB;  Service: Cardiovascular;  Laterality: N/A;    There were no vitals filed for this visit.   Subjective Assessment - 01/15/20 1018    Subjective Pt reports he is doing well.    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 8/31-9/3 then inpatient rehab 9/3-9/23.PMH: CHF, HTN, hyperlipidemia, glomerulonephritis, pancytopenia/chronic anemia, seizure left-sided post stroke    Patient Stated Goals Pt wants to be able to walk again.    Currently in Pain? No/denies    Pain Onset In the past 7 days                             [facility] Adult PT Treatment/Exercise - 01/15/20 1019      Transfers   Comments Pt performed floor to standing transfer with instruction to get up on knees and then hold to mat table to get right foot under him and rise. Pt able to perform mod I. Performed same manner without mat table to push from and pt needed min assist to rise as left leg did not feel strong enough to come  up. Instructed pt to try to always find something to push/pull up on if should end of on ground. Also instructed to keep phone with him when walking outside on own just as a precaution. He verbalized understanding and states wife tells him the same thing      Ambulation/Gait   Ambulation/Gait Yes    Ambulation/Gait Assistance 5: Supervision    Ambulation/Gait Assistance Details Pt was cued to increase left heel strike. Pt does have decreased toe off on left. BP=160/88 after gait    Ambulation Distance (Feet) 1200 Feet    Assistive device None    Gait Pattern Step-through pattern    Ambulation Surface Level;Unlevel;Outdoor;Paved;Grass     Gait Comments BWS over treadmill x 10 min at 1.1mph without hands. BWS only for safety, not unweighting. Verbal cues to increase right step length and push off on left.                    PT Short Term Goals - 12/24/19 1136      PT SHORT TERM GOAL #1   Title Pt will be able to perform SLS x 5 sec each leg for improved balance and strength.    Baseline 5 sec on right, 1-2 sec on left    Time 4    Period Weeks    Status Partially Met    Target Date 01/07/20   updated due to delay in scheduling     PT SHORT TERM GOAL #2   Title Pt will ambulate 500' on level indoor surfaces without AFO for improved gait and strength independently.    Baseline Pt ambulated >500' on level surfaces without AFO independently on 11/26/19    Time 4    Period Weeks    Status Achieved    Target Date 10/28/19             PT Long Term Goals - 12/24/19 1137      PT LONG TERM GOAL #1   Title Pt will ambulate >1000' without AD  on varied surfaces mod I for improved community mobility.    Baseline mod I on level but supervision on nonlevel without AD with left AFO.    Time 8    Period Weeks    Status On-going    Target Date 02/05/20   updated due to delay in scheduling     PT LONG TERM GOAL #2   Title Pt will increase FGA from 18/30 to >22/30    Baseline 18/30 on 07/31/19, 19/30 on 09/28/19, 19/30 on 11/26/19    Time 8    Period Weeks    Status Not Met    Target Date 02/05/20      PT LONG TERM GOAL #3   Title Pt will ambulate up/down 4 steps in reciprocal pattern without railing independently for improved functional strength and balance.    Baseline able to perform reciprocal pattern on steps with 1 railing. CGA and step-to pattern with descent without railing    Time 8    Period Weeks    Status On-going    Target Date 02/05/20      PT LONG TERM GOAL #4   Title Pt will increase gait speed to 1.2 m/s or higher for normal, safe community ambulator speed without AD.    Baseline without  AD=0.61m/s, 1.77m/s without AD on 09/28/19, 11/26/19 without AFO comfortable=0.47m/s and 1.13m/s fast    Time 8    Period Weeks    Status On-going  Target Date 02/05/20                 Plan - 01/15/20 1114    Clinical Impression Statement Pt able to perform floor transfer with something to push up on mod I. Needs min assist without something to hold on to. Was able to increase time and speed on treadmill today without AFO donned.    Personal Factors and Comorbidities Comorbidity 3+    Comorbidities CHF, HTN, hyperlipidemia, glomerulonephritis, pancytopenia/chronic anemia, seizure    Examination-Activity Limitations Stairs;Stand;Squat;Locomotion Level;Transfers;Bed Mobility    Examination-Participation Restrictions Community Activity;Shop;Driving;Cleaning;Yard Work    Merchant [employer] officer Evolving/Moderate complexity    Rehab Potential Good    PT Frequency 1x / week    PT Duration 8 weeks    PT Treatment/Interventions DME Instruction;ADLs/Self Care Home Management;Gait training;Stair training;Functional mobility training;Therapeutic activities;Therapeutic exercise;Balance training;Orthotic Fit/Training;Patient/family education;Neuromuscular re-education;Wheelchair mobility training;Passive range of motion;Manual techniques    PT Next Visit Plan Hip strengthening and flexibility with focus on hip flexor muscle length and hamstring. Needs to improve hip extension and PF strength for better push off with gait. Continue BWS over treadmill without AFO.  Gait on varied surfaces without AFO. Resisted gait, and add in more dual task activities as well. Continue dynamic balance activities and dynamic gait overground    PT Home Exercise Plan Access Code: [email or web address]    Consulted and Agree with Plan of Care Patient           Patient will benefit from skilled therapeutic intervention in order to improve the following deficits and impairments:  Abnormal gait, Difficulty walking,  Decreased balance, Decreased mobility, Decreased strength, Decreased knowledge of use of DME, Decreased endurance, Decreased activity tolerance, Decreased range of motion, Pain, Increased muscle spasms  Visit Diagnosis: Other abnormalities of gait and mobility  Muscle weakness (generalized)  Hemiplegia and hemiparesis following cerebral infarction affecting left non-dominant side [facility])     Problem List Patient Active Problem List   Diagnosis Date Noted  . Essential hypertension 05/25/2019  . 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 Acute blood loss anemia   . Steroid-induced hyperglycemia   . Acute on chronic anemia   . Seizures ([place])   . Spastic hemiparesis ([place])   . 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 Hypertension   . Thrombocytopenia ([place])   . Acute unilateral cerebral infarction in a watershed distribution [facility]) 01/15/2019  . Weakness   . CVA (cerebral vascular accident) ([place]) 01/12/2019  . Malnutrition of moderate degree 07/31/2018  . Elevated serum protein level   . Multiple myeloma ([place])   . Pancytopenia ([place])   . Normocytic normochromic anemia 07/16/2018  . Acute kidney failure ([place]) 07/16/2018  . NICM (nonischemic cardiomyopathy) ([place]) 11/12/2013  . History of ETOH abuse 11/12/2013  . Chronic combined systolic and diastolic CHF (congestive heart failure) ([place]) 11/12/2013  . Congestive dilated cardiomyopathy ([place]) 11/04/2013  . Malignant hypertension     Electa Sniff, PT, DPT, NCS 01/15/2020, 11:16 AM  [facility] [address] [place], [place], [address] Phone: [phone]   Fax:  [phone]  Name: LEANTHONY RHETT MRN: [id number] Date of Birth: 10-03-1960

## 2020-01-22 ENCOUNTER — Other Ambulatory Visit: Payer: Self-pay

## 2020-01-22 ENCOUNTER — Ambulatory Visit: Payer: 59

## 2020-01-22 VITALS — BP 150/80

## 2020-01-22 DIAGNOSIS — I69354 Hemiplegia and hemiparesis following cerebral infarction affecting left non-dominant side: Secondary | ICD-10-CM

## 2020-01-22 DIAGNOSIS — R2689 Other abnormalities of gait and mobility: Secondary | ICD-10-CM | POA: Diagnosis not present

## 2020-01-22 DIAGNOSIS — M6281 Muscle weakness (generalized): Secondary | ICD-10-CM

## 2020-01-22 NOTE — Therapy (Signed)
[facility] [address] [place], [place], [address] Phone: [phone]   Fax:  [phone]  Physical Therapy Treatment  Patient Details  Name: Jeremy Grant MRN: [id number] Date of Birth: [birth year] Referring Provider (PT): Dr. Letta Pate   Encounter Date: 01/22/2020   PT End of Session - 01/22/20 0932    Visit Number 64    Number of Visits 33    Date for PT Re-Evaluation 49/44/96   cert 90 days but poc 60 days   Authorization Type [employer] (Pt has recent change in insurance)    PT Start Time 0930    PT Stop Time 1013    PT Time Calculation (min) 43 min    Equipment Utilized During Treatment Gait belt    Activity Tolerance Patient tolerated treatment well    Behavior During Therapy WFL for tasks assessed/performed           Past Medical History:  Diagnosis Date   Arthritis    CHF (congestive heart failure) (HCC)    Combined systolic and diastolic cardiac dysfunction    Echo 11/03/2013 EF 75%, grade 3 diastolic dysfunction   Hypertension    NICM (nonischemic cardiomyopathy) ([place])    a. L/RHC (11/05/13): RA: 3, RV 52/5, PA 49/19 (31), PCWP 10, AO 166/93, PA 67%, Fick CO/CI: 5.71/2.97, Lmain: normal, LAD: large, without signficant dz, first diagonal has 20% dz at ostium, LCx: normal, RCA: 30% stenosis at the bifurcation of PDA and PLOM    Past Surgical History:  Procedure Laterality Date   BIOPSY  01/31/2019   Procedure: BIOPSY;  Surgeon: Otis Brace, MD;  Location: [facility];  Service: Gastroenterology;;   ESOPHAGOGASTRODUODENOSCOPY N/A 01/31/2019   Procedure: ESOPHAGOGASTRODUODENOSCOPY (EGD);  Surgeon: Otis Brace, MD;  Location: [facility] ENDOSCOPY;  Service: Gastroenterology;  Laterality: N/A;   IR FLUORO GUIDE CV LINE RIGHT  07/25/2018   IR US GUIDE VASC ACCESS RIGHT  07/25/2018   LEFT AND RIGHT HEART CATHETERIZATION WITH CORONARY ANGIOGRAM N/A 11/05/2013   Procedure: LEFT AND RIGHT  HEART CATHETERIZATION WITH CORONARY ANGIOGRAM;  Surgeon: Peter M Martinique, MD;  Location: [facility] CATH LAB;  Service: Cardiovascular;  Laterality: N/A;   RIGHT HEART CATHETERIZATION N/A 01/14/2014   Procedure: RIGHT HEART CATH;  Surgeon: Larey Dresser, MD;  Location: [facility] CATH LAB;  Service: Cardiovascular;  Laterality: N/A;    Vitals:   01/22/20 0935  BP: (!) 150/80     Subjective Assessment - 01/22/20 0932    Subjective Pt reports nothing new to report. Did feel like left leg was a little weaker yesterday and this morning.    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 8/31-9/3 then inpatient rehab 9/3-9/23.PMH: CHF, HTN, hyperlipidemia, glomerulonephritis, pancytopenia/chronic anemia, seizure left-sided post stroke    Patient Stated Goals Pt wants to be able to walk again.    Currently in Pain? No/denies    Pain Onset In the past 7 days                             [facility] Adult PT Treatment/Exercise - 01/22/20 0933      Ambulation/Gait   Ambulation/Gait Yes    Ambulation/Gait Assistance 5: Supervision    Ambulation/Gait Assistance Details Verbal cues to increase left step length with more heel strike and toe off.    Ambulation Distance (Feet) 230 Feet    Assistive device None  Gait Pattern Step-through pattern;Decreased stance time - left;Decreased step length - left    Ambulation Surface Level;Indoor    Gait Comments BWS over treadmill x 10 min at 1.21mh without AFO donned. 5 min without UE support on level then 1 UE at 3% incline x 3 min then 2 min level again with PT facilitating at left pelvic to bring leg through. Pt was cued to increase left step length. Did note left left hip flexion with incline getting off leg quicker. Pt reports left leg just feels weaker today. BP 150/80 after.      Neuro Re-ed    Neuro Re-ed Details  In // bars: exaggerated  left hip flexion and then bringing  leg back in to extension x 10 then repeated on right to increase left stance time. Step-ups with LLE on 6" step with 1 UE support 10 x 2 with right hip flexion coming up never touching step to again increase left stance time. Verbal cues for form      Exercises   Other Exercises  Standing left gastroc/hip flexor stretch 30 sec x 3 with verbal cues for form. Pt instructed to keep heel down and to push hip forward some                  PT Education - 01/22/20 1119    Education Details Discussed planned d/c next week    Person(s) Educated Patient    Methods Explanation    Comprehension Verbalized understanding            PT Short Term Goals - 12/24/19 1136      PT SHORT TERM GOAL #1   Title Pt will be able to perform SLS x 5 sec each leg for improved balance and strength.    Baseline 5 sec on right, 1-2 sec on left    Time 4    Period Weeks    Status Partially Met    Target Date 01/07/20   updated due to delay in scheduling     PT SHORT TERM GOAL #2   Title Pt will ambulate 500' on level indoor surfaces without AFO for improved gait and strength independently.    Baseline Pt ambulated >500' on level surfaces without AFO independently on 11/26/19    Time 4    Period Weeks    Status Achieved    Target Date 10/28/19             PT Long Term Goals - 12/24/19 1137      PT LONG TERM GOAL #1   Title Pt will ambulate >1000' without AD  on varied surfaces mod I for improved community mobility.    Baseline mod I on level but supervision on nonlevel without AD with left AFO.    Time 8    Period Weeks    Status On-going    Target Date 02/05/20   updated due to delay in scheduling     PT LONG TERM GOAL #2   Title Pt will increase FGA from 18/30 to >22/30    Baseline 18/30 on 07/31/19, 19/30 on 09/28/19, 19/30 on 11/26/19    Time 8    Period Weeks    Status Not Met    Target Date 02/05/20      PT LONG TERM GOAL #3   Title Pt will ambulate up/down 4 steps in reciprocal  pattern without railing independently for improved functional strength and balance.    Baseline able to perform reciprocal pattern  on steps with 1 railing. CGA and step-to pattern with descent without railing    Time 8    Period Weeks    Status On-going    Target Date 02/05/20      PT LONG TERM GOAL #4   Title Pt will increase gait speed to 1.2 m/s or higher for normal, safe community ambulator speed without AD.    Baseline without AD=0.1ms, 1.140m without AD on 09/28/19, 11/26/19 without AFO comfortable=0.8811mand 1.30m110mast    Time 8    Period Weeks    Status On-going    Target Date 02/05/20                 Plan - 01/22/20 1120    Clinical Impression Statement Pt was having a little more trouble with left leg advancement on treadmill today especially when added in incline. Was first time trying without brace and pt reported leg just felt a little weaker today.    Personal Factors and Comorbidities Comorbidity 3+    Comorbidities CHF, HTN, hyperlipidemia, glomerulonephritis, pancytopenia/chronic anemia, seizure    Examination-Activity Limitations Stairs;Stand;Squat;Locomotion Level;Transfers;Bed Mobility    Examination-Participation Restrictions Community Activity;Shop;Driving;Cleaning;Yard Work    StabMerchant navy officerlving/Moderate complexity    Rehab Potential Good    PT Frequency 1x / week    PT Duration 8 weeks    PT Treatment/Interventions DME Instruction;ADLs/Self Care Home Management;Gait training;Stair training;Functional mobility training;Therapeutic activities;Therapeutic exercise;Balance training;Orthotic Fit/Training;Patient/family education;Neuromuscular re-education;Wheelchair mobility training;Passive range of motion;Manual techniques    PT Next Visit Plan Check goals for discharge next visit pending no changes.    PT Home Exercise Plan Access Code: [email or web address]    Consulted and Agree with Plan of Care Patient           Patient will benefit  from skilled therapeutic intervention in order to improve the following deficits and impairments:  Abnormal gait, Difficulty walking, Decreased balance, Decreased mobility, Decreased strength, Decreased knowledge of use of DME, Decreased endurance, Decreased activity tolerance, Decreased range of motion, Pain, Increased muscle spasms  Visit Diagnosis: Other abnormalities of gait and mobility  Muscle weakness (generalized)  Hemiplegia and hemiparesis following [facility]ant [facility]  Problem List Patient Active Problem List   Diagnosis Date Noted   Essential hypertension 05/25/2019   Spasm 03/23/2019   Acute blood loss anemia    Steroid-induced hyperglycemia    Acute on chronic anemia    Seizures (HCC)    Spastic hemiparesis (HCC)    ANCA-associated vasculitis (HCC)    Hypertension    Thrombocytopenia (HCC)    Acute unilateral cerebral infarction in a watershed distribution (HCC)[place]/07/2018   Weakness    CVA (cerebral vascular accident) (HCC)[place]/31/2020   Malnutrition of moderate degree 07/31/2018   Elevated serum protein level    Multiple myeloma (HCC)    Pancytopenia (HCC)    Normocytic normochromic anemia 07/16/2018   Acute kidney failure (HCC)[place]/08/2018   NICM (nonischemic cardiomyopathy) (HCC)[place]/06/2013   History of ETOH abuse 11/12/2013   Chronic combined systolic and diastolic CHF (congestive heart failure) (HCC)[place]/06/2013   Congestive dilated cardiomyopathy (HCC)[place]/24/2015   Malignant hypertension     EmilElecta Sniff, DPT, NCS 01/22/2020, 11:22 AM  [facility] [address], [place]ne: [phone]:  [phone]: DeweTALYN DESSERT: [id number] of Birth: [birth year]

## 2020-01-28 ENCOUNTER — Other Ambulatory Visit: Payer: Self-pay

## 2020-01-28 ENCOUNTER — Ambulatory Visit: Payer: 59

## 2020-01-28 DIAGNOSIS — M6281 Muscle weakness (generalized): Secondary | ICD-10-CM

## 2020-01-28 DIAGNOSIS — I69354 Hemiplegia and hemiparesis following cerebral infarction affecting left non-dominant side: Secondary | ICD-10-CM

## 2020-01-28 DIAGNOSIS — R2689 Other abnormalities of gait and mobility: Secondary | ICD-10-CM

## 2020-01-28 NOTE — Patient Instructions (Signed)
Access Code: [email or web address] URL: [email or web address] Date: 01/28/2020 Prepared by: Cherly Anderson  Exercises Modified Marcello Moores Stretch - 3 x daily - 7 x weekly - 1 sets - 4 reps - 30 sec hold Seated Hamstring Stretch with Strap - 3 x daily - 7 x weekly - 4 reps - 1 sets - 1 min hold Standing Gastroc Stretch at Counter - 2 x daily - 7 x weekly - 4 reps - 1 sets - 30 sec hold Prone Knee Flexion - 1 x daily - 5 x weekly - 10 reps - 2 sets Supine Bridge with Pelvic Floor Contraction on Swiss Ball - 2 x daily - 5 x weekly - 10 reps - 1 sets Clamshell - 2 x daily - 5 x weekly - 10 reps - 2 sets Heel rises with counter support - 2 x daily - 5 x weekly - 10 reps - 2 sets Scapular Retraction with Resistance - 2 x daily - 5 x weekly - 10 reps - 3 sets Step Up - 2 x daily - 5 x weekly - 3 sets - 10 reps Standing with Head Rotation - 2 x daily - 7 x weekly - 3 reps - 1 sets - 10 turns hold Standing Balance with Eyes Closed - 2 x daily - 7 x weekly - 2 reps - 1 sets - 30 sec hold Tandem Stance - 1 x daily - 7 x weekly - 3 sets - 3 reps - 30 hold Single Leg Stance - 2 x daily - 7 x weekly - 1 sets - 3 reps - 10-20 sec hold

## 2020-01-28 NOTE — Therapy (Signed)
[facility] [address] [place], [place], [address] Phone: [phone]   Fax:  [phone]  Physical Therapy Treatment/Discharge Summary  Patient Details  Name: Jeremy Grant MRN: [id number] Date of Birth: 1961-01-01 Referring Provider (PT): Dr. Letta Pate  PHYSICAL THERAPY DISCHARGE SUMMARY  Visits from Start of Care: 87  Current functional level related to goals / functional outcomes: See clinical impression and goals for more information. Pt is able to ambulate independently without AD of AFO at this time on varied surfaces. He has made great progress over the course of PT but is not at safe point to continue to work on HEP at home on own.   Remaining deficits: Left hemiparesis   Education / Equipment: HEP  Plan: Patient agrees to discharge.  Patient goals were not met. Patient is being discharged due to being pleased with the current functional level.  ?????       Encounter Date: 01/28/2020   PT End of Session - 01/28/20 0845    Visit Number 30    Number of Visits 27    Date for PT Re-Evaluation 69/62/95   cert 90 days but poc 60 days   Authorization Type [employer] (Pt has recent change in insurance)    PT Start Time 0845    PT Stop Time 0926    PT Time Calculation (min) 41 min    Equipment Utilized During Treatment Gait belt    Activity Tolerance Patient tolerated treatment well    Behavior During Therapy WFL for tasks assessed/performed           Past Medical History:  Diagnosis Date   Arthritis    CHF (congestive heart failure) (HCC)    Combined systolic and diastolic cardiac dysfunction    Echo 11/03/2013 EF 28%, grade 3 diastolic dysfunction   Hypertension    NICM (nonischemic cardiomyopathy) ([place])    a. L/RHC (11/05/13): RA: 3, RV 52/5, PA 49/19 (31), PCWP 10, AO 166/93, PA 67%, Fick CO/CI: 5.71/2.97, Lmain: normal, LAD: large, without signficant dz, first diagonal has 20% dz at  ostium, LCx: normal, RCA: 30% stenosis at the bifurcation of PDA and PLOM    Past Surgical History:  Procedure Laterality Date   BIOPSY  01/31/2019   Procedure: BIOPSY;  Surgeon: Otis Brace, MD;  Location: [facility] ENDOSCOPY;  Service: Gastroenterology;;   ESOPHAGOGASTRODUODENOSCOPY N/A 01/31/2019   Procedure: ESOPHAGOGASTRODUODENOSCOPY (EGD);  Surgeon: Otis Brace, MD;  Location: [facility] ENDOSCOPY;  Service: Gastroenterology;  Laterality: N/A;   IR FLUORO GUIDE CV LINE RIGHT  07/25/2018   IR US GUIDE VASC ACCESS RIGHT  07/25/2018   LEFT AND RIGHT HEART CATHETERIZATION WITH CORONARY ANGIOGRAM N/A 11/05/2013   Procedure: LEFT AND RIGHT HEART CATHETERIZATION WITH CORONARY ANGIOGRAM;  Surgeon: Peter M Martinique, MD;  Location: [facility] CATH LAB;  Service: Cardiovascular;  Laterality: N/A;   RIGHT HEART CATHETERIZATION N/A 01/14/2014   Procedure: RIGHT HEART CATH;  Surgeon: Larey Dresser, MD;  Location: [facility] CATH LAB;  Service: Cardiovascular;  Laterality: N/A;    There were no vitals filed for this visit.   Subjective Assessment - 01/28/20 0845    Subjective Pt reports that he is doing good. No new issues.    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 8/31-9/3 then inpatient rehab 9/3-9/23.PMH: CHF, HTN, hyperlipidemia, glomerulonephritis, pancytopenia/chronic anemia, seizure left-sided post stroke    Patient Stated Goals Pt wants to be able to walk again.  Currently in Pain? No/denies    Pain Onset In the past 7 days              [facility] PT Assessment - 01/28/20 0846      High Level Balance   High Level Balance Comments SLS 6.9 sec right and 2 sec left      Functional Gait  Assessment   Gait assessed  Yes    Gait Level Surface Walks 20 ft in less than 7 sec but greater than 5.5 sec, uses assistive device, slower speed, mild gait deviations, or deviates 6-10 in outside of the 12 in walkway width.     Change in Gait Speed Able to change speed, demonstrates mild gait deviations, deviates 6-10 in outside of the 12 in walkway width, or no gait deviations, unable to achieve a major change in velocity, or uses a change in velocity, or uses an assistive device.    Gait with Horizontal Head Turns Performs head turns smoothly with slight change in gait velocity (eg, minor disruption to smooth gait path), deviates 6-10 in outside 12 in walkway width, or uses an assistive device.    Gait with Vertical Head Turns Performs task with slight change in gait velocity (eg, minor disruption to smooth gait path), deviates 6 - 10 in outside 12 in walkway width or uses assistive device    Gait and Pivot Turn Pivot turns safely within 3 sec and stops quickly with no loss of balance.    Step Over Obstacle Is able to step over one shoe box (4.5 in total height) without changing gait speed. No evidence of imbalance.    Gait with Narrow Base of Support Ambulates 4-7 steps.    Gait with Eyes Closed Walks 20 ft, uses assistive device, slower speed, mild gait deviations, deviates 6-10 in outside 12 in walkway width. Ambulates 20 ft in less than 9 sec but greater than 7 sec.    Ambulating Backwards Walks 20 ft, uses assistive device, slower speed, mild gait deviations, deviates 6-10 in outside 12 in walkway width.    Steps Alternating feet, must use rail.    Total Score 20                         [facility] Adult PT Treatment/Exercise - 01/28/20 0846      Ambulation/Gait   Ambulation/Gait Yes    Ambulation/Gait Assistance 7: Independent    Ambulation Distance (Feet) 1600 Feet    Assistive device None    Gait Pattern Step-through pattern;Decreased stance time - left    Ambulation Surface Level;Unlevel;Outdoor;Paved;Grass    Gait velocity 10.9 sec=0.91 m/s comfortable and 8.5 sec=1.34m/s fast    Stairs Yes    Stairs Assistance 6: Modified independent (Device/Increase time);5: Supervision    Stairs Assistance  Details (indicate cue type and reason) mod I with 1 railing in reciprocal pattern, supervision with step-to pattern without rail    Stair Management Technique Alternating pattern;Step to pattern;One rail Right;No rails    Number of Stairs 8      Exercises   Exercises Other Exercises    Other Exercises  PT updated HEP organizing in to stretches, strengthening and balance exercises and instructed to in ways to progress diffculty. Issued new, updated handout as noted below with adding in SLS and step-ups.                  PT Education - 01/28/20 1923    Education Details Discussed  d/c as planned. Finalized HEP.    Person(s) Educated Patient;Spouse    Methods Explanation;Handout    Comprehension Verbalized understanding            PT Short Term Goals - 01/28/20 1924      PT SHORT TERM GOAL #1   Title Pt will be able to perform SLS x 5 sec each leg for improved balance and strength.    Baseline 5 sec on right, 1-2 sec on left. 01/28/20 6 sec right and 2 sec left    Time 4    Period Weeks    Status Not Met    Target Date 01/07/20   updated due to delay in scheduling     PT SHORT TERM GOAL #2   Title Pt will ambulate 500' on level indoor surfaces without AFO for improved gait and strength independently.    Baseline Pt ambulated >500' on level surfaces without AFO independently on 11/26/19    Time 4    Period Weeks    Status Achieved    Target Date 10/28/19             PT Long Term Goals - 01/28/20 1924      PT LONG TERM GOAL #1   Title Pt will ambulate >1000' without AD  on varied surfaces mod I for improved community mobility.    Baseline mod I 1600' without AD or AFO on 01/28/20    Time 8    Period Weeks    Status Achieved      PT LONG TERM GOAL #2   Title Pt will increase FGA from 18/30 to >22/30    Baseline 18/30 on 07/31/19, 19/30 on 09/28/19, 19/30 on 11/26/19, 01/28/20 20/30    Time 8    Period Weeks    Status Not Met      PT LONG TERM GOAL #3   Title Pt  will ambulate up/down 4 steps in reciprocal pattern without railing independently for improved functional strength and balance.    Baseline reciprocal with 1 railing mod I, step-to without railing supervision    Time 8    Period Weeks    Status Not Met      PT LONG TERM GOAL #4   Title Pt will increase gait speed to 1.2 m/s or higher for normal, safe community ambulator speed without AD.    Baseline without AD=0.23m/s, 1.78m/s without AD on 09/28/19, 11/26/19 without AFO comfortable=0.60m/s and 1.96m/s fast. 01/28/20 0.61m/s comfortable and 1.59m/s fast    Time 8    Period Weeks    Status Not Met                 Plan - 01/28/20 1927    Clinical Impression Statement Pt has progressed well towards LTGs but is just short on achieving FGA with score of 20/30 and with gait speed of 0.65m/s comfortable. He is ambulating at safe community ambulator speed. Able to ambulate >1600' without AD or AFO independently at this time on varied surfaces. Pt still challenged with SLS time on left with no change in time on this leg. He has been issued HEP to continue to work on at home. PT discharging today.    Personal Factors and Comorbidities Comorbidity 3+    Comorbidities CHF, HTN, hyperlipidemia, glomerulonephritis, pancytopenia/chronic anemia, seizure    Examination-Activity Limitations Stairs;Stand;Squat;Locomotion Level;Transfers;Bed Mobility    Examination-Participation Restrictions Community Activity;Shop;Driving;Cleaning;Yard Work    Merchant navy officer Evolving/Moderate complexity    Rehab Potential Good  PT Frequency 1x / week    PT Duration 8 weeks    PT Treatment/Interventions DME Instruction;ADLs/Self Care Home Management;Gait training;Stair training;Functional mobility training;Therapeutic activities;Therapeutic exercise;Balance training;Orthotic Fit/Training;Patient/family education;Neuromuscular re-education;Wheelchair mobility training;Passive range of motion;Manual  techniques    PT Next Visit Plan Discharged today    PT Home Exercise Plan Access Code: [email or web address]    Consulted and Agree with Plan of Care Patient           Patient will benefit from skilled therapeutic intervention in order to improve the following deficits and impairments:  Abnormal gait, Difficulty walking, Decreased balance, Decreased mobility, Decreased strength, Decreased knowledge of use of DME, Decreased endurance, Decreased activity tolerance, Decreased range of motion, Pain, Increased muscle spasms  Visit Diagnosis: Other abnormalities of gait and mobility  Muscle weakness (generalized)  Hemiplegia and hemiparesis following cerebral infarction affecting left non-dominant side [facility])     Problem List Patient Active Problem List   Diagnosis Date Noted   Essential hypertension 05/25/2019   Spasm 03/23/2019   Acute blood loss anemia    Steroid-induced hyperglycemia    Acute on chronic anemia    Seizures (HCC)    Spastic hemiparesis (HCC)    ANCA-associated vasculitis (HCC)    Hypertension    Thrombocytopenia (HCC)    Acute unilateral cerebral infarction in a watershed distribution ([place]) 01/15/2019   Weakness    CVA (cerebral vascular accident) ([place]) 01/12/2019   Malnutrition of moderate degree 07/31/2018   Elevated serum protein level    Multiple myeloma (HCC)    Pancytopenia (HCC)    Normocytic normochromic anemia 07/16/2018   Acute kidney failure ([place]) 07/16/2018   NICM (nonischemic cardiomyopathy) ([place]) 11/12/2013   History of ETOH abuse 11/12/2013   Chronic combined systolic and diastolic CHF (congestive heart failure) ([place]) 11/12/2013   Congestive dilated cardiomyopathy ([place]) 11/04/2013   Malignant hypertension     Electa Sniff, PT, DPT, NCS 01/28/2020, 7:31 PM  [facility] [address] [place], [place], [address] Phone: [phone]   Fax:  [phone]  Name: Jeremy Grant MRN: [id number] Date of Birth: 1960-11-22

## 2020-02-12 ENCOUNTER — Other Ambulatory Visit: Payer: Self-pay | Admitting: Nephrology

## 2020-02-12 DIAGNOSIS — N184 Chronic kidney disease, stage 4 (severe): Secondary | ICD-10-CM

## 2020-03-01 ENCOUNTER — Telehealth: Payer: Self-pay | Admitting: Adult Health

## 2020-03-01 ENCOUNTER — Other Ambulatory Visit: Payer: 59

## 2020-03-01 NOTE — Telephone Encounter (Signed)
Records faxed to [facility] at [phone] on 03/01/20.

## 2020-04-21 ENCOUNTER — Ambulatory Visit (INDEPENDENT_AMBULATORY_CARE_PROVIDER_SITE_OTHER): Payer: 59 | Admitting: Adult Health

## 2020-04-21 ENCOUNTER — Encounter: Payer: Self-pay | Admitting: Adult Health

## 2020-04-21 VITALS — BP 170/98 | HR 90 | Ht 73.0 in | Wt 168.0 lb

## 2020-04-21 DIAGNOSIS — R569 Unspecified convulsions: Secondary | ICD-10-CM

## 2020-04-21 DIAGNOSIS — I10 Essential (primary) hypertension: Secondary | ICD-10-CM

## 2020-04-21 DIAGNOSIS — Z8673 Personal history of transient ischemic attack (TIA), and cerebral infarction without residual deficits: Secondary | ICD-10-CM

## 2020-04-21 DIAGNOSIS — I639 Cerebral infarction, unspecified: Secondary | ICD-10-CM | POA: Diagnosis not present

## 2020-04-21 DIAGNOSIS — Z5181 Encounter for therapeutic drug level monitoring: Secondary | ICD-10-CM | POA: Diagnosis not present

## 2020-04-21 DIAGNOSIS — E785 Hyperlipidemia, unspecified: Secondary | ICD-10-CM

## 2020-04-21 DIAGNOSIS — F4323 Adjustment disorder with mixed anxiety and depressed mood: Secondary | ICD-10-CM

## 2020-04-21 MED ORDER — DIVALPROEX SODIUM ER 500 MG PO TB24: 500.0000 mg | ORAL_TABLET | Freq: Two times a day (BID) | ORAL | 3 refills | Status: DC

## 2020-04-21 NOTE — Progress Notes (Signed)
I agree with the above plan 

## 2020-04-21 NOTE — Progress Notes (Signed)
[facility] [address] [place].  [address] [phone]       STROKE FOLLOW UP NOTE  [name] Date of Birth:  May 10, 1961 Medical Record Number:  [id number]   Reason for Referral: stroke follow up    CHIEF COMPLAINT:  Chief Complaint  Patient presents with  . Follow-up    Tx rm, with wife, states bp at home is  around 150/80 , c/o numbness in left foot, blurry vision in left eye, reports fall on 03/31/20, c/o mood changes     HPI:   Today, 04/21/2020, [name] returns for 74-month stroke and seizure follow-up accompanied by his wife.  Stable from stroke standpoint with residual left hemiparesis and gait impairment.  Completed PT in September as he made significant improvements but unfortunately suffered a fall at [facility] on 03/31/2020 without head injury.  He did experience bilateral knee pain post fall limiting ambulation and has since noticed sensation of left leg heaviness.  He has not been doing routine exercises as recommended during therapy sessions. C/o gradual worsening of left eye blurred vision previously diagnosed with cataracts.  Denies acute onset, diplopia or visual loss.  Denies new stroke/TIA symptoms.  Remains on aspirin and atorvastatin without bleeding or bruising.  Blood pressure today elevated but monitors at home which has been stable.  Denies seizure activity currently on Depakote 500 mg twice daily and Keppra 500 mg twice daily without side effects.  Wife is concerned regarding increased depression and anxiety due to residual stroke deficits and difficulty doing activities he previously enjoyed.  Denies prior history of depression or anxiety or family history.  He is not currently on any type of medication management nor is interested but wife questions use of counseling for possible benefit.  No further concerns at this time.   History provided for reference purposes only Update 10/19/2019 JM: 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  He is accompanied by his wife.  He has been stable since prior visit 6 months ago without new stroke/TIA symptoms or recurrent seizure activity.  Continues to work with outpatient PT for residual stroke deficits of LLE weakness, gait impairment and imbalance.  He endorses ongoing improvement. He ambulates with cane and AFO brace occasionally depending on distance and terrain.  Continues on tizanidine 4 mg nightly with ongoing benefit of spasticity.  He continues on Depakote 500 mg twice daily and Keppra 500 mg twice daily tolerating well.  Continues on aspirin 81 mg daily and atorvastatin 80 mg daily for secondary stroke prevention.  Blood pressure today 154/74.  Continues to follow closely with nephrology history of thrombocytopenia and vasculitis currently in remission.  No concerns at this time.   Update 06/18/2019 JM: [name] is a 59 year old male who is being seen today for stroke follow-up accompanied by his wife.  Residual stroke deficits left sided weakness but greatly improving with ongoing participation of physical therapy.  Improvement of prior spasticity with ongoing use of tizanidine nightly managed by physical medicine and rehab Dr. Posey Pronto.  he is now ambulatory with a cane and denies any recent falls.  He continues to use AFO brace when he leaves the house.  After prior visit, discussion with nephrologist  Dr. Hollie Salk regarding possibly decreasing Depakote dosage due to thrombocytopenia.  Depakote dosage decreased and currently on 500 mg twice daily with patient/wife reporting improvement of lab work.  He does have follow-up with nephrologist next week.  He is also continued on Keppra 500 mg twice daily.  Denies any recent seizure activities.  Only possible side effects includes upper extremity mild tremors which she has not had previously but denies interference with ADLs.  His wife questions possible return to  driving.  Continues on aspirin 81 mg daily and atorvastatin 80 mg daily for secondary stroke prevention without side effects.  Blood pressure today initially elevated at 181/88 but on recheck 138/80.  He does monitor at home and does endorse whitecoat syndrome and typically blood pressure at home typically 1 30-1 40/60 to 70s.  Denies new or worsening stroke/TIA symptoms.    Initial visit 03/03/2019 JM: [name] is a 59 year old male who is being seen today for hospital follow-up.  Residual deficits of spastic left hemiparesis and continues to work with outpatient PT with reports of improvement.  He is able to ambulate with a cane but will use wheelchair for long distance.  He will occasionally have LLE spasms and continues on tizanidine with benefit.  No reoccurring seizure activity with ongoing use of Depakote 1000 mg twice daily and Keppra 500 mg twice daily.  Tolerating well without side effects.  Continues on aspirin 81 mg with mild bruising but no bleeding.  Continues on atorvastatin 80 mg without myalgias.  Blood pressure today 124/72.  He continues to follow with Dr. Hollie Salk at [facility] with recent visit last week and initiated carvedilol due to continued elevated blood pressures.  He does not routinely monitor at home.  30-day cardiac event ordered but has not been completed at this time.  No further concerns at this time.  Stroke admission 01/12/2019: [name] is a 59 y.o. male with history of nonischemic cardiomyopathy, hypertension, combination of systolic and diastolic cardiac dysfunction, congestive heart failure and arthritis presented on 01/12/2019 with L leg weakness.  Stroke work-up showed patchy right corpus callosum infarct with possible watershed secondary to unknown source.  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  MRA showed right ICA stenosis and pericallosal region (questionable emboli).  Carotid Dopplers show bilateral ICA 1 to 39% stenosis in VAs antegrade.  2D echo EF of 55% without cardiac source of embolus identified.  TCD no evidence of PFO.  Given pancytopenia, he is not a good AC candidate therefore TEE/loop not considered at that time.  Recommended 30-day cardiac event monitor outpatient.  LDL 122.  A1c 5.4.  Initiated DAPT for 3 weeks and aspirin alone.  HTN stable.  Initiated atorvastatin 80 mg daily for HLD and secondary stroke prevention.  No evidence or history of DM.  Other stroke risk factors include EtOH use, CAD, systolic and diastolic CHF, nonischemic cardiomyopathy and prior stroke on imaging.  Other active problems include glomerulonephritis, pancytopenia, shingles in May with postherpetic neuralgia now resolved as well as lumbar spinal disease.  Residual deficits of left hemiparesis and discharged to CIR for ongoing therapies on 01/15/2019.  During CIR admission, Plavix discontinued as well as Lovenox due to excessive bruising with bouts of anemia requiring transfusions.  Underwent EGD on 01/31/2019 with no active bleeding but did show nonbleeding distal esophageal ulcer as well as erosive gastropathy and recommended follow-up with GI outpatient.  He also had bouts of seizures with follow-up EEG negative and initiated Depakote 1000 mg twice daily and Keppra 500 mg twice daily.  He was discharged home  with recommendation of outpatient therapies on 02/04/2019.       ROS:   14 system review of systems performed and negative with exception of those listed in HPI  Depression screen [facility] 2/9 04/21/2020 02/17/2019  Decreased Interest 0 1  Down, Depressed, Hopeless 2 1  PHQ - 2 Score 2 2  Altered sleeping 2 2  Tired, decreased energy 0 2  Change in appetite 0 0  Feeling bad or failure about yourself  2 1  Trouble concentrating 0 0  Moving slowly or fidgety/restless 0 0   Suicidal thoughts 0 0  PHQ-9 Score 6 7  Difficult doing work/chores - Somewhat difficult  Some recent data might be hidden    GAD 7 : Generalized Anxiety Score 04/21/2020  Nervous, Anxious, on Edge 1  Control/stop worrying 2  Worry too much - different things 2  Trouble relaxing 1  Restless 1  Easily annoyed or irritable 1  Afraid - awful might happen 1  Total GAD 7 Score 9     PMH:  Past Medical History:  Diagnosis Date  . Arthritis   . CHF (congestive heart failure) ([place])   . Combined systolic and diastolic cardiac dysfunction    Echo 11/03/2013 EF 82%, grade 3 diastolic dysfunction  . Hypertension   . NICM (nonischemic cardiomyopathy) ([place])    a. L/RHC (11/05/13): RA: 3, RV 52/5, PA 49/19 (31), PCWP 10, AO 166/93, PA 67%, Fick CO/CI: 5.71/2.97, Lmain: normal, LAD: large, without signficant dz, first diagonal has 20% dz at ostium, LCx: normal, RCA: 30% stenosis at the bifurcation of PDA and PLOM    PSH:  Past Surgical History:  Procedure Laterality Date  . BIOPSY  01/31/2019   Procedure: BIOPSY;  Surgeon: Otis Brace, MD;  Location: [facility];  Service: Gastroenterology;;  . ESOPHAGOGASTRODUODENOSCOPY N/A 01/31/2019   Procedure: ESOPHAGOGASTRODUODENOSCOPY (EGD);  Surgeon: Otis Brace, MD;  Location: [facility] ENDOSCOPY;  Service: Gastroenterology;  Laterality: N/A;  . IR FLUORO GUIDE CV LINE RIGHT  07/25/2018  . IR US GUIDE VASC ACCESS RIGHT  07/25/2018  . LEFT AND RIGHT HEART CATHETERIZATION WITH CORONARY ANGIOGRAM N/A 11/05/2013   Procedure: LEFT AND RIGHT HEART CATHETERIZATION WITH CORONARY ANGIOGRAM;  Surgeon: Peter M Martinique, MD;  Location: [facility] CATH LAB;  Service: Cardiovascular;  Laterality: N/A;  . RIGHT HEART CATHETERIZATION N/A 01/14/2014   Procedure: RIGHT HEART CATH;  Surgeon: Larey Dresser, MD;  Location: [facility] CATH LAB;  Service: Cardiovascular;  Laterality: N/A;    Social History:  Social History   Socioeconomic History  . Marital status: Married     Spouse name: BRENDA  . Number of children: 0  . Years of education: 36  . Highest education level: 12th grade  Occupational History  . Occupation: Medical sales representative  Tobacco Use  . Smoking status: Never Smoker  . Smokeless tobacco: Never Used  Substance and Sexual Activity  . Alcohol use: Yes    Alcohol/week: 3.0 standard drinks    Types: 3 Glasses of wine per week  . Drug use: No  . Sexual activity: Not Currently    Birth control/protection: None  Other Topics Concern  . Not on file  Social History Narrative  . Not on file   Social Determinants of Health   Financial Resource Strain: Not on file  Food Insecurity: Not on file  Transportation Needs: Not on file  Physical Activity: Not on file  Stress: Not on file  Social Connections: Not on file  Intimate Partner Violence: Not on  file    Family History:  Family History  Problem Relation Age of Onset  . Heart attack Father   . Heart disease Father   . Arrhythmia Father   . Hypertension Brother   . Hypertension Brother     Medications:   Current Outpatient Medications on File Prior to Visit  Medication Sig Dispense Refill  . acetaminophen (TYLENOL) 325 MG tablet Take 2 tablets (650 mg total) by mouth every 6 (six) hours as needed for moderate pain.    Marland Kitchen amLODipine (NORVASC) 10 MG tablet Take 1 tablet (10 mg total) by mouth daily. 30 tablet 1  . aspirin EC 81 MG EC tablet Take 1 tablet (81 mg total) by mouth daily.    Marland Kitchen azaTHIOprine (IMURAN) 50 MG tablet Take 1 tablet (50 mg total) by mouth daily. 30 tablet 1  . carvedilol (COREG) 12.5 MG tablet Take 12.5 mg by mouth 2 (two) times daily with a meal.    . levETIRAcetam (KEPPRA) 500 MG tablet Take 1 tablet (500 mg total) by mouth 2 (two) times daily. 180 tablet 3  . pantoprazole (PROTONIX) 40 MG tablet Take 1 tablet (40 mg total) by mouth daily. 30 tablet 0  . predniSONE (DELTASONE) 5 MG tablet Take 5 mg by mouth daily with breakfast.    . tiZANidine (ZANAFLEX) 4 MG tablet  Take 1 tablet (4 mg total) by mouth every 8 (eight) hours as needed for muscle spasms. 90 tablet 1  . atorvastatin (LIPITOR) 80 MG tablet Take 1 tablet (80 mg total) by mouth daily at 6 PM. 30 tablet 0   No current facility-administered medications on file prior to visit.    Allergies:   Allergies  Allergen Reactions  . Hydralazine Hcl      Physical Exam  Vitals:   04/21/20 1046  BP: (!) 170/98  Pulse: 90  Weight: 168 lb (76.2 kg)  Height: 6\' 1"  (1.854 m)   Body mass index is 22.16 kg/m. No exam data present  General: well developed, well nourished,  pleasant middle-age Caucasian male, seated, in no evident distress Head: head normocephalic and atraumatic.   Neck: supple with no carotid or supraclavicular bruits Cardiovascular: regular rate and rhythm, no murmurs Musculoskeletal: no deformity Skin:  no rash/petichiae Vascular:  Normal pulses all extremities   Neurologic Exam Mental Status: Awake and fully alert.  Normal speech and language. Oriented to place and time. Recent and remote memory intact. Attention span, concentration and fund of knowledge appropriate. Mood and affect appropriate.  Cranial Nerves: Pupils equal, briskly reactive to light. Extraocular movements full without nystagmus. Visual fields full to confrontation. Hearing intact. Facial sensation intact.  Very mild left lower facial weakness Motor: Full strength right upper and lower extremity.  Full strength LUE and LLE mild hip flexor weakness and ankle dorsiflexion weakness with use of AFO brace Sensory.: intact to touch , pinprick , position and vibratory sensation.  Coordination: Rapid alternating movements equal symmetrically. Finger-to-nose and heel-to-shin performed accurately bilaterally. Gait and Station: Able to stand without assistance or difficulty.  Abnormal gait with steppage gait and mild imbalance greater with turns.  Mild difficulty standing on single leg.  Able to ambulate without assistive  device. Reflexes: 1+ and symmetric. Toes downgoing.         ASSESSMENT: Jeremy Grant is a 59 y.o. 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  MRI also showed evidence of previous  small subacute left frontal and chronic infarcts likely embolic etiology.  Recent shingles which shingles vasculitis consideration but left-sided subacute on chronic infarcts predated his onset of shingles hence unlikely to be caused.  Chronic pancytopenia with low platelet count and anemia and likely not a good candidate for anticoagulation.  Possible consideration of 30-day cardiac event monitor outpatient - patient declined further work-up.  During CIR admission, evidence of partial seizure activity and initiated Depakote and Keppra for seizure prophylaxis.  Vascular risk factors include HTN, HLD, EtOH use, CAD, systolic and diastolic CHF, nonischemic cardiomyopathy, pancytopenia and prior stroke on imaging. He continues on Keppra 500 mg twice daily and Depakote 500 mg twice daily without recent seizure activity.      PLAN:  1. Right CR infarct:  a. Residual deficit: Gait impairment and mild LLE weakness - reports fall 03/2020 with worsening of L leg weakness with " heaviness sensation" likely in setting of limited ambulation d/t knee pain post fall.  Recommend slowly increasing daily exercising and routinely performing HEP as recommended during therapy sessions.  Denies head injury and increased weakness gradually improving.  No indication for imaging at this time but advised patient and wife to call 911 immediately with any acute worsening or new stroke/TIA symptoms or further evaluation b. continue aspirin 81 mg daily  and atorvastatin for secondary stroke prevention  c. Discussed secondary stroke prevention measures and importance of close PCP follow-up for aggressive stroke risk factor  management 2. Seizures, poststroke:  a. Stable without recent seizure.   b. On Keppra 500 mg twice daily and Depakote 500 mg twice daily for seizure prophylaxis.   c. Patient and wife discussed possibly decreasing dosage of Depakote with goals of discontinuing.  Advised patient per [place] law, if AED dosage decreased, no driving for 6 months post adjustment.  He will further consider decreasing dosage but is hesitant as he wants to continue to drive.  d. Will obtain valproic acid level, CBC and hepatic function panel 3. Adjustment disorder with depression and anxiety: in setting of residual stroke deficits and limitation of independence and performing prior activities.  Referral placed to neuropsychology as he is interested in counseling.  Also encouraged looking into a stroke support groups which would likely be beneficial for both patient and wife.  He is not interested in pharmacological management at this time.  Denies suicidal ideation 4. HTN: BP goal<130/90.  Stable at home on carvedilol and amlodipine per PCP 5. HLD: LDL goal<70. On atorvastatin 80 mg daily    Follow up in 6 months or call earlier if needed   CC:  [facility] provider: Dr. Oswaldo Done, Maebelle Munroe, MD    I spent a prolonged 50 minutes of face-to-face and non-face-to-face time with patient and wife.  This included previsit chart review, lab review, study review, order entry, electronic health record documentation, patient and wife education and discussion regarding history of stroke with residual deficits, post stroke seizures and ongoing use of AED and possibly decreasing dosage, depression/anxiety in setting of residual strength deficits, importance of managing stroke risk factors and answered all other questions to patient and wife satisfaction   Frann Rider, AGNP-BC  [facility] [address] [place], [place] [address]  Phone [phone] Fax [phone] Note: This document was prepared  with digital dictation and possible smart phrase technology. Any transcriptional errors that result from this process are unintentional.

## 2020-04-21 NOTE — Patient Instructions (Signed)
Referral place to Dr. Sima Matas neuropsychology for depression/anxiety after a stroke  Would recommend further looking into stroke support groups which could be very beneficial   Continue keppra and depakote - we will do lab work today  Continue aspirin 81 mg daily  and atorvastatin  for secondary stroke prevention  Continue to follow up with PCP regarding cholesterol and blood pressure management  Maintain strict control of hypertension with blood pressure goal below 130/90 and cholesterol with LDL cholesterol (bad cholesterol) goal below 70 mg/dL.       Followup in the future with me in 6 months or call earlier if needed       Thank you for coming to see [place] at [facility]. I hope we have been able to provide you high quality care today.  You may receive a patient satisfaction survey over the next few weeks. We would appreciate your feedback and comments so that we may continue to improve ourselves and the health of our patients.

## 2020-04-22 LAB — HEPATIC FUNCTION PANEL
ALT: 18 IU/L (ref 0–44)
AST: 16 IU/L (ref 0–40)
Albumin: 4.7 g/dL (ref 3.8–4.9)
Alkaline Phosphatase: 58 IU/L (ref 44–121)
Bilirubin Total: 0.4 mg/dL (ref 0.0–1.2)
Bilirubin, Direct: 0.13 mg/dL (ref 0.00–0.40)
Total Protein: 6.9 g/dL (ref 6.0–8.5)

## 2020-04-22 LAB — CBC WITH DIFFERENTIAL/PLATELET
Basophils Absolute: 0 10*3/uL (ref 0.0–0.2)
Basos: 0 %
EOS (ABSOLUTE): 0 10*3/uL (ref 0.0–0.4)
Eos: 0 %
Hematocrit: 38 % (ref 37.5–51.0)
Hemoglobin: 13.3 g/dL (ref 13.0–17.7)
Immature Grans (Abs): 0.1 10*3/uL (ref 0.0–0.1)
Immature Granulocytes: 1 %
Lymphocytes Absolute: 0.4 10*3/uL — ABNORMAL LOW (ref 0.7–3.1)
Lymphs: 6 %
MCH: 33.9 pg — ABNORMAL HIGH (ref 26.6–33.0)
MCHC: 35 g/dL (ref 31.5–35.7)
MCV: 97 fL (ref 79–97)
Monocytes Absolute: 0.4 10*3/uL (ref 0.1–0.9)
Monocytes: 6 %
Neutrophils Absolute: 5.3 10*3/uL (ref 1.4–7.0)
Neutrophils: 87 %
Platelets: 118 10*3/uL — ABNORMAL LOW (ref 150–450)
RBC: 3.92 x10E6/uL — ABNORMAL LOW (ref 4.14–5.80)
RDW: 13.1 % (ref 11.6–15.4)
WBC: 6.1 10*3/uL (ref 3.4–10.8)

## 2020-04-22 LAB — VALPROIC ACID LEVEL: Valproic Acid Lvl: 68 ug/mL (ref 50–100)

## 2020-04-26 ENCOUNTER — Telehealth: Payer: Self-pay

## 2020-04-26 NOTE — Telephone Encounter (Signed)
-----   Message from Frann Rider, NP sent at 04/26/2020  7:14 AM EST ----- Please advise patient that recent lab work stable including liver function.  CBC stable per patient's baseline.  Thank you.

## 2020-04-26 NOTE — Telephone Encounter (Signed)
Pt verified by name and DOB,  normal results given per provider, pt voiced understanding all question answered. °

## 2020-04-27 ENCOUNTER — Encounter: Payer: Self-pay | Admitting: Psychology

## 2020-04-29 ENCOUNTER — Ambulatory Visit
Admission: RE | Admit: 2020-04-29 | Discharge: 2020-04-29 | Disposition: A | Discharge status: Home / Self Care | Payer: 59 | Source: Ambulatory Visit | Attending: Nephrology | Admitting: Nephrology

## 2020-04-29 DIAGNOSIS — N184 Chronic kidney disease, stage 4 (severe): Secondary | ICD-10-CM

## 2020-08-04 ENCOUNTER — Ambulatory Visit: Payer: 59 | Admitting: Psychology

## 2020-08-11 ENCOUNTER — Other Ambulatory Visit: Payer: Self-pay | Admitting: Nephrology

## 2020-08-11 DIAGNOSIS — N184 Chronic kidney disease, stage 4 (severe): Secondary | ICD-10-CM

## 2020-08-11 DIAGNOSIS — Z01818 Encounter for other preprocedural examination: Secondary | ICD-10-CM

## 2020-08-29 ENCOUNTER — Other Ambulatory Visit: Payer: 59

## 2020-09-05 DIAGNOSIS — Z0271 Encounter for disability determination: Secondary | ICD-10-CM

## 2020-09-12 ENCOUNTER — Other Ambulatory Visit: Payer: Self-pay

## 2020-09-12 ENCOUNTER — Ambulatory Visit
Admission: RE | Admit: 2020-09-12 | Discharge: 2020-09-12 | Disposition: A | Discharge status: Home / Self Care | Payer: 59 | Source: Ambulatory Visit | Attending: Nephrology | Admitting: Nephrology

## 2020-09-12 DIAGNOSIS — Z01818 Encounter for other preprocedural examination: Secondary | ICD-10-CM

## 2020-09-12 DIAGNOSIS — Z0289 Encounter for other administrative examinations: Secondary | ICD-10-CM

## 2020-09-12 DIAGNOSIS — N184 Chronic kidney disease, stage 4 (severe): Secondary | ICD-10-CM

## 2020-09-24 NOTE — Progress Notes (Addendum)
Cardiology Office Note   Date:  09/26/2020   ID:  Jeremy Grant, Jeremy Grant 05-23-1960, MRN [id number]  PCP:  Hayden Rasmussen, MD  Cardiologist:   None Referring:  Madelon Lips, MD  Chief Complaint  Patient presents with  . Pre-op Exam      History of Present Illness: Jeremy Grant is a 60 y.o. male who is referred by Madelon Lips, MD for preop evaluation prior to a kidney transplant.  He had a previous non ischemic cardiomyopathy.   He had previously seen Dr. Aundra Dubin years ago.  He had an EF of 20% in 2015.  However later that year it was up to 55%.  This was thought to be related to ETOH.  He stopped drinking alcohol.   He had a normal EF on echo in August 2020.   He had mild coronary plaque in 2015.    He has renal failure but is not actually on dialysis.  However, he is going to be on the transplant list.  He had a previous stroke.  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  He is managed with this.  He has ANCA necrotizing concentric glomerulonephritis.  From a cardiac standpoint he is limited somewhat because of his stroke.  He has left-sided weakness and has a left leg brace.  He can still do some activities.  If he does not get chest pressure, neck or arm discomfort.  He does not have shortness of breath, PND or orthopnea.  He does have easy fatigability and decreased exercise tolerance.  He said no weight gain or edema.   Past Medical History:  Diagnosis Date  . Arthritis   . CHF (congestive heart failure) ([place])   . Combined systolic and diastolic cardiac dysfunction    Echo 11/03/2013 EF 08%, grade 3 diastolic dysfunction  . Hypertension   . NICM (nonischemic cardiomyopathy) ([place])    a. L/RHC (11/05/13): RA: 3, RV 52/5, PA 49/19 (31), PCWP 10, AO 166/93, PA 67%, Fick CO/CI: 5.71/2.97, Lmain: normal, LAD: large, without signficant dz, first diagonal has 20% dz at ostium, LCx: normal, RCA: 30% stenosis at the bifurcation of PDA and PLOM     Past Surgical History:  Procedure Laterality Date  . BIOPSY  01/31/2019   Procedure: BIOPSY;  Surgeon: Otis Brace, MD;  Location: [facility];  Service: Gastroenterology;;  . ESOPHAGOGASTRODUODENOSCOPY N/A 01/31/2019   Procedure: ESOPHAGOGASTRODUODENOSCOPY (EGD);  Surgeon: Otis Brace, MD;  Location: [facility] ENDOSCOPY;  Service: Gastroenterology;  Laterality: N/A;  . IR FLUORO GUIDE CV LINE RIGHT  07/25/2018  . IR US GUIDE VASC ACCESS RIGHT  07/25/2018  . LEFT AND RIGHT HEART CATHETERIZATION WITH CORONARY ANGIOGRAM N/A 11/05/2013   Procedure: LEFT AND RIGHT HEART CATHETERIZATION WITH CORONARY ANGIOGRAM;  Surgeon: Peter M Martinique, MD;  Location: [facility] CATH LAB;  Service: Cardiovascular;  Laterality: N/A;  . RIGHT HEART CATHETERIZATION N/A 01/14/2014   Procedure: RIGHT HEART CATH;  Surgeon: Larey Dresser, MD;  Location: [facility] CATH LAB;  Service: Cardiovascular;  Laterality: N/A;     Current Outpatient Medications  Medication Sig Dispense Refill  . acetaminophen (TYLENOL) 325 MG tablet Take 2 tablets (650 mg total) by mouth every 6 (six) hours as needed for moderate pain.    Marland Kitchen amLODipine (NORVASC) 10 MG tablet Take 1 tablet (10 mg total) by mouth daily. 30 tablet 1  . aspirin EC 81 MG EC tablet Take 1 tablet (81 mg total) by mouth daily.    Marland Kitchen  azaTHIOprine (IMURAN) 50 MG tablet Take 1 tablet (50 mg total) by mouth daily. 30 tablet 1  . carvedilol (COREG) 12.5 MG tablet Take 12.5 mg by mouth 2 (two) times daily with a meal.    . divalproex (DEPAKOTE ER) 500 MG 24 hr tablet Take 1 tablet (500 mg total) by mouth in the morning and at bedtime. 180 tablet 3  . doxazosin (CARDURA) 2 MG tablet 1 tablet    . levETIRAcetam (KEPPRA) 500 MG tablet Take 1 tablet (500 mg total) by mouth 2 (two) times daily. 180 tablet 3  . pantoprazole (PROTONIX) 40 MG tablet Take 1 tablet (40 mg total) by mouth daily. 30 tablet 0  . predniSONE (DELTASONE) 5 MG tablet Take 5 mg by mouth daily with breakfast.    .  tiZANidine (ZANAFLEX) 4 MG tablet Take 1 tablet (4 mg total) by mouth every 8 (eight) hours as needed for muscle spasms. 90 tablet 1  . atorvastatin (LIPITOR) 80 MG tablet Take 1 tablet (80 mg total) by mouth daily at 6 PM. 30 tablet 0   No current facility-administered medications for this visit.    Allergies:   Hydralazine hcl    Social History:  The patient  reports that he has never smoked. He has never used smokeless tobacco. He reports current alcohol use of about 3.0 standard drinks of alcohol per week. He reports that he does not use drugs.   Family History:  The patient's family history includes Arrhythmia in his father; Heart attack (age of onset: 82) in his father; Heart disease in his father; Hypertension in his brother and brother.    ROS:  Please see the history of present illness.   Otherwise, review of systems are positive for insomnia.   All other systems are reviewed and negative.    PHYSICAL EXAM: VS:  BP (!) 152/100   Pulse 66   Ht 6' (1.829 m)   Wt 169 lb (76.7 kg)   SpO2 97%   BMI 22.92 kg/m  , BMI Body mass index is 22.92 kg/m. GENERAL:  Well appearing HEENT:  Pupils equal round and reactive, fundi not visualized, oral mucosa unremarkable NECK:  No jugular venous distention, waveform within normal limits, carotid upstroke brisk and symmetric, no bruits, no thyromegaly LYMPHATICS:  No cervical, inguinal adenopathy LUNGS:  Clear to auscultation bilaterally BACK:  No CVA tenderness CHEST:  Unremarkable HEART:  PMI not displaced or sustained,S1 and S2 within normal limits, no S3, no S4, no clicks, no rubs, no murmurs ABD:  Flat, positive bowel sounds normal in frequency in pitch, no bruits, no rebound, no guarding, no midline pulsatile mass, no hepatomegaly, no splenomegaly EXT:  2 plus pulses upper and decreased DP/PT bilateral lower, no edema, no cyanosis no clubbing SKIN:  No rashes no nodules NEURO:  Left left weakness PSYCH:  Cognitively intact, oriented  to person place and time    EKG:  EKG is ordered today. The ekg ordered today demonstrates normal sinus rhythm, rate 66, axis within normal limits, intervals within normal limits, low voltage in the limb leads, poor anterior R wave progression.   Recent Labs: 04/21/2020: ALT 18; Hemoglobin 13.3; Platelets 118    Lipid Panel    Component Value Date/Time   CHOL 119 06/18/2019 1014   TRIG 127 06/18/2019 1014   HDL 37 (L) 06/18/2019 1014   CHOLHDL 3.2 06/18/2019 1014   CHOLHDL 6.0 01/13/2019 0303   VLDL 39 01/13/2019 0303   LDLCALC 59 06/18/2019 1014  Wt Readings from Last 3 Encounters:  09/26/20 169 lb (76.7 kg)  04/21/20 168 lb (76.2 kg)  10/19/19 159 lb (72.1 kg)      Other studies Reviewed: Additional studies/ records that were reviewed today include: Previous hospital records, nephrology records. Review of the above records demonstrates:  Please see elsewhere in the note.     ASSESSMENT AND PLAN:  PREOP:    The patient is being considered by [facility] for renal transplant.  He has a low functional level because of his previous stroke.  He has risk factors.  He has easy fatigability which could be an anginal equivalent.  He said coronary calcium noted on his CTs recently.  He had nonobstructive plaque years ago.  He needs to be evaluated with a stress test but would not be able to walk on a treadmill.  Therefore, he will have a [employer].  CHRONIC DIASTOLIC DYSFUNCTION: He seems to be euvolemic.  No change in therapy.  HTN:  He and his wife report that his blood pressure is "all over the place."  They will keep a blood pressure diary checking it 3 times a day for 10 days and he might need further adjustment to his medications.  CKD: Per nephrology   Current medicines are reviewed at length with the patient today.  The patient does not have concerns regarding medicines.  The following changes have been made:  no change  Labs/ tests ordered today include:    Orders Placed This Encounter  Procedures  . MYOCARDIAL PERFUSION IMAGING  . EKG 12-Lead     Disposition:   FU with me as needed.   Signed, Minus Breeding, MD  09/26/2020 10:25 AM    [facility]

## 2020-09-26 ENCOUNTER — Encounter: Payer: Self-pay | Admitting: *Deleted

## 2020-09-26 ENCOUNTER — Ambulatory Visit (INDEPENDENT_AMBULATORY_CARE_PROVIDER_SITE_OTHER): Payer: 59 | Admitting: Cardiology

## 2020-09-26 ENCOUNTER — Encounter: Payer: Self-pay | Admitting: Cardiology

## 2020-09-26 ENCOUNTER — Other Ambulatory Visit: Payer: Self-pay

## 2020-09-26 VITALS — BP 152/100 | HR 66 | Ht 72.0 in | Wt 169.0 lb

## 2020-09-26 DIAGNOSIS — I5032 Chronic diastolic (congestive) heart failure: Secondary | ICD-10-CM | POA: Diagnosis not present

## 2020-09-26 DIAGNOSIS — I10 Essential (primary) hypertension: Secondary | ICD-10-CM

## 2020-09-26 DIAGNOSIS — Z01810 Encounter for preprocedural cardiovascular examination: Secondary | ICD-10-CM | POA: Diagnosis not present

## 2020-09-26 NOTE — Patient Instructions (Addendum)
Medication Instructions:  Your physician recommends that you continue on your current medications as directed. Please refer to the Current Medication list given to you today.   *If you need a refill on your cardiac medications before your next appointment, please call your pharmacy*  Lab Work: NONE   Testing/Procedures: Your physician has requested that you have a lexiscan myoview. For further information please visit [email or web address]. Please follow instruction sheet, as given.  Follow-Up: At [facility], you and your health needs are our priority.  As part of our continuing mission to provide you with exceptional heart care, we have created designated Provider Care Teams.  These Care Teams include your primary Cardiologist (physician) and Advanced Practice Providers (APPs -  Physician Assistants and Nurse Practitioners) who all work together to provide you with the care you need, when you need it.  We recommend signing up for the patient portal called "MyChart".  Sign up information is provided on this After Visit Summary.  MyChart is used to connect with patients for Virtual Visits (Telemedicine).  Patients are able to view lab/test results, encounter notes, upcoming appointments, etc.  Non-urgent messages can be sent to your provider as well.   To learn more about what you can do with MyChart, go to [email or web address].    Your next appointment:   AS NEEDED  MONITOR BLOOD PRESSURE 3 TIMES A DAY FOR 10 DAYS CALL, MAIL, SEND IN MYCHART OR BRING READINGS TO OFFICE

## 2020-10-07 ENCOUNTER — Telehealth (HOSPITAL_COMMUNITY): Payer: Self-pay | Admitting: *Deleted

## 2020-10-07 NOTE — Telephone Encounter (Signed)
Close encounter 

## 2020-10-11 ENCOUNTER — Other Ambulatory Visit: Payer: Self-pay

## 2020-10-11 ENCOUNTER — Ambulatory Visit (HOSPITAL_COMMUNITY)
Admission: RE | Admit: 2020-10-11 | Discharge: 2020-10-11 | Disposition: A | Discharge status: Home / Self Care | Payer: 59 | Source: Ambulatory Visit | Attending: Cardiology | Admitting: Cardiology

## 2020-10-11 DIAGNOSIS — Z01810 Encounter for preprocedural cardiovascular examination: Secondary | ICD-10-CM | POA: Insufficient documentation

## 2020-10-11 DIAGNOSIS — I10 Essential (primary) hypertension: Secondary | ICD-10-CM | POA: Insufficient documentation

## 2020-10-11 LAB — MYOCARDIAL PERFUSION IMAGING
LV dias vol: 122 mL (ref 62–150)
LV sys vol: 59 mL
Peak HR: 79 {beats}/min
Rest HR: 57 {beats}/min
SDS: 0
SRS: 0
SSS: 0
TID: 0.94

## 2020-10-11 MED ORDER — TECHNETIUM TC 99M TETROFOSMIN IV KIT
31.4000 | PACK | Freq: Once | INTRAVENOUS | Status: AC | PRN
  Administered 2020-10-11: 31.4 via INTRAVENOUS
  Filled 2020-10-11: qty 32

## 2020-10-11 MED ORDER — TECHNETIUM TC 99M TETROFOSMIN IV KIT
10.9000 | PACK | Freq: Once | INTRAVENOUS | Status: AC | PRN
  Administered 2020-10-11: 10.9 via INTRAVENOUS
  Filled 2020-10-11: qty 11

## 2020-10-11 MED ORDER — ADENOSINE (DIAGNOSTIC) 3 MG/ML IV SOLN
0.5600 mg/kg | Freq: Once | INTRAVENOUS | Status: AC
  Administered 2020-10-11: 42.9 mg via INTRAVENOUS

## 2020-10-17 ENCOUNTER — Telehealth: Payer: Self-pay | Admitting: Adult Health

## 2020-10-17 MED ORDER — LEVETIRACETAM 500 MG PO TABS: 500.0000 mg | ORAL_TABLET | Freq: Two times a day (BID) | ORAL | 3 refills | Status: DC

## 2020-10-17 NOTE — Telephone Encounter (Signed)
Pt's wife, Andray Assefa request refill levETIRAcetam (KEPPRA) 500 MG tablet at [place]

## 2020-10-18 ENCOUNTER — Telehealth: Payer: Self-pay | Admitting: Cardiology

## 2020-10-18 NOTE — Telephone Encounter (Signed)
Received records request from [facility] , forwarded to [facility] for processing. 10/18/20 [facility]

## 2020-10-24 ENCOUNTER — Ambulatory Visit: Payer: 59 | Admitting: Adult Health

## 2020-11-22 NOTE — Progress Notes (Signed)
[facility] [address] [place]. [place] [address] [phone]       STROKE FOLLOW UP NOTE  [name] Date of Birth:  1961/01/10 Medical Record Number:  [id number]   Reason for Referral: stroke follow up    CHIEF COMPLAINT:  Chief Complaint  Patient presents with   History of Stroke    Rm 1, 6 month FU wife- [name] "left leg and foot hurts a lot, numbness on bottom, cramps, ankle/foot are very tight; need new leg brace, need refill on depakote"     HPI:   Today, 11/22/2020, Jeremy Grant returns for 6 month stroke and seizure follow up accompanied by his wife. Stable from stroke standpoint without new stroke/TIA symptoms.  He continues to experience left leg weakness as well as pain, numbness on the outer portion of his foot, cramping/spasm and increased ankle stiffness.  He does keep active around his home but does not do any specific therapy exercises or stretching.  He continues to use AFO brace for ambulation and more recently has had difficulty ensuring that the brace is in place. They are requesting possibility of a new AFO brace. Compliant on aspirin and atorvastatin without associated side effects.  Blood pressure today elevated.  Monitors at home and has been elevated per patient and wife with recent levels 140-160/70-100. He is currently working with cardiology and nephrology for difficulty to control blood pressure.  Continues to work with [facility] in the process of being placed on kidney transplant list. Compliant on Depakote and Keppra tolerating without side effects and denies any recent seizure activity. No further concerns at this time.    History provided for reference purposes only Update 04/21/2020 JM: Jeremy Grant returns for 63-month stroke and seizure follow-up accompanied by his wife.  Stable from stroke standpoint with residual left hemiparesis and gait impairment.  Completed PT in September as he made significant improvements but  unfortunately suffered a fall at [facility] on 03/31/2020 without head injury.  He did experience bilateral knee pain post fall limiting ambulation and has since noticed sensation of left leg heaviness.  He has not been doing routine exercises as recommended during therapy sessions. C/o gradual worsening of left eye blurred vision previously diagnosed with cataracts.  Denies acute onset, diplopia or visual loss.  Denies new stroke/TIA symptoms.  Remains on aspirin and atorvastatin without bleeding or bruising.  Blood pressure today elevated but monitors at home which has been stable.  Denies seizure activity currently on Depakote 500 mg twice daily and Keppra 500 mg twice daily without side effects.  Wife is concerned regarding increased depression and anxiety due to residual stroke deficits and difficulty doing activities he previously enjoyed.  Denies prior history of depression or anxiety or family history.  He is not currently on any type of medication management nor is interested but wife questions use of counseling for possible benefit.  No further concerns at this time.  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  He is accompanied by his wife.  He has been stable since prior visit 6 months ago without new stroke/TIA symptoms or recurrent seizure activity.  Continues to work with outpatient PT for residual stroke deficits of LLE weakness, gait impairment and imbalance.  He endorses ongoing improvement. He ambulates with cane and AFO brace occasionally depending on distance and terrain.  Continues on tizanidine 4 mg nightly with ongoing benefit of spasticity.  He  continues on Depakote 500 mg twice daily and Keppra 500 mg twice daily tolerating well.  Continues on aspirin 81 mg daily and atorvastatin 80 mg daily for secondary stroke prevention.  Blood pressure today 154/74.  Continues to follow closely with nephrology  history of thrombocytopenia and vasculitis currently in remission.  No concerns at this time.   Update 06/18/2019 JM: Jeremy Grant is a 60 year old male who is being seen today for stroke follow-up accompanied by his wife.  Residual stroke deficits left sided weakness but greatly improving with ongoing participation of physical therapy.  Improvement of prior spasticity with ongoing use of tizanidine nightly managed by physical medicine and rehab Dr. Posey Pronto.  he is now ambulatory with a cane and denies any recent falls.  He continues to use AFO brace when he leaves the house.  After prior visit, discussion with nephrologist  Dr. Hollie Salk regarding possibly decreasing Depakote dosage due to thrombocytopenia.  Depakote dosage decreased and currently on 500 mg twice daily with patient/wife reporting improvement of lab work.  He does have follow-up with nephrologist next week.  He is also continued on Keppra 500 mg twice daily.  Denies any recent seizure activities.  Only possible side effects includes upper extremity mild tremors which she has not had previously but denies interference with ADLs.  His wife questions possible return to driving.  Continues on aspirin 81 mg daily and atorvastatin 80 mg daily for secondary stroke prevention without side effects.  Blood pressure today initially elevated at 181/88 but on recheck 138/80.  He does monitor at home and does endorse whitecoat syndrome and typically blood pressure at home typically 1 30-1 40/60 to 70s.  Denies new or worsening stroke/TIA symptoms.    Initial visit 03/03/2019 JM: Jeremy Grant is a 60 year old male who is being seen today for hospital follow-up.  Residual deficits of spastic left hemiparesis and continues to work with outpatient PT with reports of improvement.  He is able to ambulate with a cane but will use wheelchair for long distance.  He will occasionally have LLE spasms and continues on tizanidine with benefit.  No reoccurring seizure activity  with ongoing use of Depakote 1000 mg twice daily and Keppra 500 mg twice daily.  Tolerating well without side effects.  Continues on aspirin 81 mg with mild bruising but no bleeding.  Continues on atorvastatin 80 mg without myalgias.  Blood pressure today 124/72.  He continues to follow with Dr. Hollie Salk at [facility] with recent visit last week and initiated carvedilol due to continued elevated blood pressures.  He does not routinely monitor at home.  30-day cardiac event ordered but has not been completed at this time.  No further concerns at this time.  Stroke admission 01/12/2019: Jeremy Grant is a 60 y.o. male with history of nonischemic cardiomyopathy, hypertension, combination of systolic and diastolic cardiac dysfunction, congestive heart failure and arthritis presented on 01/12/2019 with L leg weakness.  Stroke work-up showed patchy right corpus callosum infarct with possible watershed secondary to unknown source.  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  MRA showed right ICA stenosis and pericallosal region (questionable emboli).  Carotid Dopplers show bilateral ICA 1 to 39% stenosis in VAs antegrade.  2D echo EF of 55% without cardiac source of embolus identified.  TCD no evidence of PFO.  Given pancytopenia, he is not a  good AC candidate therefore TEE/loop not considered at that time.  Recommended 30-day cardiac event monitor outpatient.  LDL 122.  A1c 5.4.  Initiated DAPT for 3 weeks and aspirin alone.  HTN stable.  Initiated atorvastatin 80 mg daily for HLD and secondary stroke prevention.  No evidence or history of DM.  Other stroke risk factors include EtOH use, CAD, systolic and diastolic CHF, nonischemic cardiomyopathy and prior stroke on imaging.  Other active problems include glomerulonephritis,  pancytopenia, shingles in May with postherpetic neuralgia now resolved as well as lumbar spinal disease.  Residual deficits of left hemiparesis and discharged to CIR for ongoing therapies on 01/15/2019.  During CIR admission, Plavix discontinued as well as Lovenox due to excessive bruising with bouts of anemia requiring transfusions.  Underwent EGD on 01/31/2019 with no active bleeding but did show nonbleeding distal esophageal ulcer as well as erosive gastropathy and recommended follow-up with GI outpatient.  He also had bouts of seizures with follow-up EEG negative and initiated Depakote 1000 mg twice daily and Keppra 500 mg twice daily.  He was discharged home with recommendation of outpatient therapies on 02/04/2019.       ROS:   14 system review of systems performed and negative with exception of those listed in HPI   PMH:  Past Medical History:  Diagnosis Date   Arthritis    CHF (congestive heart failure) (HCC)    Combined systolic and diastolic cardiac dysfunction    Echo 11/03/2013 EF 75%, grade 3 diastolic dysfunction   Hypertension    NICM (nonischemic cardiomyopathy) ([place])    a. L/RHC (11/05/13): RA: 3, RV 52/5, PA 49/19 (31), PCWP 10, AO 166/93, PA 67%, Fick CO/CI: 5.71/2.97, Lmain: normal, LAD: large, without signficant dz, first diagonal has 20% dz at ostium, LCx: normal, RCA: 30% stenosis at the bifurcation of PDA and PLOM   Stroke [facility])     PSH:  Past Surgical History:  Procedure Laterality Date   BIOPSY  01/31/2019   Procedure: BIOPSY;  Surgeon: Otis Brace, MD;  Location: [facility];  Service: Gastroenterology;;   ESOPHAGOGASTRODUODENOSCOPY N/A 01/31/2019   Procedure: ESOPHAGOGASTRODUODENOSCOPY (EGD);  Surgeon: Otis Brace, MD;  Location: [facility] ENDOSCOPY;  Service: Gastroenterology;  Laterality: N/A;   IR FLUORO GUIDE CV LINE RIGHT  07/25/2018   IR US GUIDE VASC ACCESS RIGHT  07/25/2018   LEFT AND RIGHT HEART CATHETERIZATION WITH CORONARY ANGIOGRAM N/A 11/05/2013    Procedure: LEFT AND RIGHT HEART CATHETERIZATION WITH CORONARY ANGIOGRAM;  Surgeon: Peter M Martinique, MD;  Location[facility] C[facility] LAB;  Service: Cardiovascular;  Laterality: N/A;   RIGHT HEART CATHETERIZATION N/A 01/14/2014   Procedure: RIGHT HEART CATH;  Surgeon: Larey Dresser, MD;  Location: [facility] CATH LAB;  Service: Cardiovascular;  Laterality: N/A;    Social History:  Social History   Socioeconomic History   Marital status: Married    Spouse name: BRENDA   Number of children: 0   Years of education: 12   Highest education level: 12th grade  Occupational History   Occupation: Medical sales representative  Tobacco Use   Smoking status: Never   Smokeless tobacco: Never  Substance and Sexual Activity   Alcohol use: Yes    Alcohol/week: 3.0 standard drinks    Types: 3 Glasses of wine per week   Drug use: No   Sexual activity: Not Currently    Birth control/protection: None  Other Topics Concern   Not on file  Social History Narrative   Lives with wife at home with dog  Abbie.     Social Determinants of Health   Financial Resource Strain: Not on file  Food Insecurity: Not on file  Transportation Needs: Not on file  Physical Activity: Not on file  Stress: Not on file  Social Connections: Not on file  Intimate Partner Violence: Not on file    Family History:  Family History  Problem Relation Age of Onset   Heart attack Father 43   Heart disease Father    Arrhythmia Father    Hypertension Brother    Hypertension Brother     Medications:   Current Outpatient Medications on File Prior to Visit  Medication Sig Dispense Refill   acetaminophen (TYLENOL) 325 MG tablet Take 2 tablets (650 mg total) by mouth every 6 (six) hours as needed for moderate pain.     amLODipine (NORVASC) 10 MG tablet Take 1 tablet (10 mg total) by mouth daily. 30 tablet 1   aspirin EC 81 MG EC tablet Take 1 tablet (81 mg total) by mouth daily.     atorvastatin (LIPITOR) 80 MG tablet Take 1 tablet (80 mg total) by mouth  daily at 6 PM. 30 tablet 0   azaTHIOprine (IMURAN) 50 MG tablet Take 1 tablet (50 mg total) by mouth daily. 30 tablet 1   carvedilol (COREG) 12.5 MG tablet Take 12.5 mg by mouth 2 (two) times daily with a meal.     doxazosin (CARDURA) 2 MG tablet 1 tablet     levETIRAcetam (KEPPRA) 500 MG tablet Take 1 tablet (500 mg total) by mouth 2 (two) times daily. 180 tablet 3   pantoprazole (PROTONIX) 40 MG tablet Take 1 tablet (40 mg total) by mouth daily. 30 tablet 0   predniSONE (DELTASONE) 5 MG tablet Take 5 mg by mouth daily with breakfast.     tiZANidine (ZANAFLEX) 4 MG tablet Take 1 tablet (4 mg total) by mouth every 8 (eight) hours as needed for muscle spasms. 90 tablet 1   No current facility-administered medications on file prior to visit.    Allergies:   Allergies  Allergen Reactions   Hydralazine Hcl      Physical Exam  Vitals:   11/23/20 0806  BP: (!) 180/98  Pulse: 68  Weight: 164 lb (74.4 kg)  Height: 6\' 1"  (1.854 m)    Body mass index is 21.64 kg/m. No results found.  General: well developed, well nourished, pleasant middle-age Caucasian male, seated, in no evident distress Head: head normocephalic and atraumatic.   Neck: supple with no carotid or supraclavicular bruits Cardiovascular: regular rate and rhythm, no murmurs Musculoskeletal: no deformity Skin:  no rash/petichiae Vascular:  Normal pulses all extremities   Neurologic Exam Mental Status: Awake and fully alert.  Normal speech and language. Oriented to place and time. Recent and remote memory intact. Attention span, concentration and fund of knowledge appropriate. Mood and affect appropriate.  Cranial Nerves: Pupils equal, briskly reactive to light. Extraocular movements full without nystagmus. Visual fields full to confrontation. Hearing intact. Facial sensation intact.  Very mild left lower facial weakness Motor: Full strength right upper and lower extremity.  LUE: 4+-5/5 and LLE mild hip flexor weakness,  difficulty assessing ankle strength due to very limited range of motion Sensory.: intact to touch , pinprick , position and vibratory sensation.  Coordination: Rapid alternating movements equal symmetrically. Finger-to-nose and heel-to-shin performed accurately bilaterally. Gait and Station: Able to stand without assistance or difficulty.  Abnormal gait with steppage gait and mild imbalance greater with turns.  Mild  difficulty standing on single leg.  AFO brace on LLE Reflexes: 1+ and symmetric. Toes downgoing.         ASSESSMENT: Jeremy Grant is a 60 y.o. 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  MRI also showed evidence of previous small subacute left frontal and chronic infarcts likely embolic etiology.  Recent shingles which shingles vasculitis consideration but left-sided subacute on chronic infarcts predated his onset of shingles hence unlikely to be caused.  Chronic pancytopenia with low platelet count and anemia and likely not a good candidate for anticoagulation.  Possible consideration of 30-day cardiac event monitor outpatient - patient declined further work-up.  During CIR admission, evidence of partial seizure activity and initiated Depakote and Keppra for seizure prophylaxis.  Vascular risk factors include HTN, HLD, EtOH use, CAD, systolic and diastolic CHF, nonischemic cardiomyopathy, pancytopenia and prior stroke on imaging. He continues on Keppra 500 mg twice daily and Depakote 500 mg twice daily without recent seizure activity.      PLAN:  Right CR infarct:  Residual deficit: Gait impairment and mild LLE weakness with increased ankle tone - referred to PMR for evaluation and possible benefit of Botox injections.  Also referred to neuro rehab PT for increased spasticity and further strengthening/stretching exercises. Order will be placed to DME to be fitted for new AFO  brace or possibly fix his current brace as he is at increased fall risk without AFO brace continue aspirin 81 mg daily  and atorvastatin for secondary stroke prevention  Discussed secondary stroke prevention measures and importance of close PCP follow-up for aggressive stroke risk factor management Seizures, poststroke:  Stable without recent seizure.   On Keppra 500 mg twice daily and Depakote 500 mg twice daily for seizure prophylaxis - refills provided  Prior lab work satisfactory - will plan on repeating at follow up visit HTN: BP goal<130/90.  Uncontrolled currently being managed by cardiology/nephrology. Encouraged continued monitoring at home and follow up with cards/nephology as scheduled  HLD: LDL goal<70. On atorvastatin 80 mg daily - reports lab work by PCP satisfactory (unable to view via epic)    Follow up in 6 months or call earlier if needed   CC:  [facility] provider: Dr. Oswaldo Done, Maebelle Munroe, MD    I spent 43 minutes of face-to-face and non-face-to-face time with patient and wife.  This included previsit chart review, lab review, study review, order entry, electronic health record documentation, patient and wife education and discussion regarding history of stroke including secondary stroke prevention measures and aggressive stroke risk factor management, residual deficits and possible benefit with botox and further therapy sessions, post stroke seizures and ongoing use of AED, and answered all other questions to patient and wife satisfaction  Frann Rider, AGNP-BC  [facility] [address] [place], [place] [address]  Phone [phone] Fax [phone] Note: This document was prepared with digital dictation and possible smart phrase technology. Any transcriptional errors that result from this process are unintentional.

## 2020-11-23 ENCOUNTER — Encounter: Payer: Self-pay | Admitting: Adult Health

## 2020-11-23 ENCOUNTER — Ambulatory Visit: Payer: 59 | Admitting: Adult Health

## 2020-11-23 ENCOUNTER — Other Ambulatory Visit: Payer: Self-pay

## 2020-11-23 VITALS — BP 180/98 | HR 68 | Ht 73.0 in | Wt 164.0 lb

## 2020-11-23 DIAGNOSIS — E785 Hyperlipidemia, unspecified: Secondary | ICD-10-CM

## 2020-11-23 DIAGNOSIS — I639 Cerebral infarction, unspecified: Secondary | ICD-10-CM | POA: Diagnosis not present

## 2020-11-23 DIAGNOSIS — R569 Unspecified convulsions: Secondary | ICD-10-CM

## 2020-11-23 DIAGNOSIS — Z8673 Personal history of transient ischemic attack (TIA), and cerebral infarction without residual deficits: Secondary | ICD-10-CM

## 2020-11-23 DIAGNOSIS — I10 Essential (primary) hypertension: Secondary | ICD-10-CM | POA: Diagnosis not present

## 2020-11-23 DIAGNOSIS — G8114 Spastic hemiplegia affecting left nondominant side: Secondary | ICD-10-CM | POA: Diagnosis not present

## 2020-11-23 MED ORDER — DIVALPROEX SODIUM ER 500 MG PO TB24: 500.0000 mg | ORAL_TABLET | Freq: Two times a day (BID) | ORAL | 3 refills | Status: DC

## 2020-11-23 NOTE — Patient Instructions (Addendum)
Referral placed to physical medicine and rehab for evaluation of botox injections for increased ankle spasticity  Referral placed to physical therapy at neuro rehab - please call them end of the week to schedule  Order will be placed to be fitted for new AFO brace - you will be called to schedule  Continue aspirin 81 mg daily  and atorvastatin 80mg  daily  for secondary stroke prevention  Continue to follow up with PCP regarding cholesterol and blood pressure management  Maintain strict control of hypertension with blood pressure goal below 130/90 and cholesterol with LDL cholesterol (bad cholesterol) goal below 70 mg/dL.   Continue depakote and keppra for seizure prevention      Followup in the future with me in 6 months or call earlier if needed       Thank you for coming to see [place] at [facility]. I hope we have been able to provide you high quality care today.  You may receive a patient satisfaction survey over the next few weeks. We would appreciate your feedback and comments so that we may continue to improve ourselves and the health of our patients.

## 2020-11-24 NOTE — Progress Notes (Signed)
I agree with the above plan 

## 2020-12-20 ENCOUNTER — Other Ambulatory Visit: Payer: Self-pay

## 2020-12-20 ENCOUNTER — Ambulatory Visit: Payer: 59 | Attending: Adult Health

## 2020-12-20 VITALS — BP 190/100

## 2020-12-20 DIAGNOSIS — I69354 Hemiplegia and hemiparesis following cerebral infarction affecting left non-dominant side: Secondary | ICD-10-CM | POA: Insufficient documentation

## 2020-12-20 DIAGNOSIS — R2681 Unsteadiness on feet: Secondary | ICD-10-CM | POA: Diagnosis present

## 2020-12-20 DIAGNOSIS — R2689 Other abnormalities of gait and mobility: Secondary | ICD-10-CM | POA: Insufficient documentation

## 2020-12-20 DIAGNOSIS — M6281 Muscle weakness (generalized): Secondary | ICD-10-CM | POA: Insufficient documentation

## 2020-12-20 NOTE — Patient Instructions (Signed)
Access Code: [email or web address] URL: [email or web address] Date: 12/20/2020 Prepared by: Cherly Anderson  Exercises Modified Marcello Moores Stretch - 3 x daily - 7 x weekly - 1 sets - 4 reps - 30 sec hold Seated Hamstring Stretch with Strap - 3 x daily - 7 x weekly - 4 reps - 1 sets - 1 min hold Standing Gastroc Stretch at Counter - 2 x daily - 7 x weekly - 4 reps - 1 sets - 30 sec hold Prone Knee Flexion - 1 x daily - 5 x weekly - 10 reps - 2 sets Supine Bridge with Pelvic Floor Contraction on Swiss Ball - 2 x daily - 5 x weekly - 10 reps - 1 sets Clamshell - 2 x daily - 5 x weekly - 10 reps - 2 sets Heel rises with counter support - 2 x daily - 5 x weekly - 10 reps - 2 sets Scapular Retraction with Resistance - 2 x daily - 5 x weekly - 10 reps - 3 sets Step Up - 2 x daily - 5 x weekly - 3 sets - 10 reps Standing with Head Rotation - 2 x daily - 7 x weekly - 3 reps - 1 sets - 10 turns hold Standing Balance with Eyes Closed - 2 x daily - 7 x weekly - 2 reps - 1 sets - 30 sec hold Tandem Stance - 1 x daily - 7 x weekly - 3 sets - 3 reps - 30 hold Single Leg Stance - 2 x daily - 7 x weekly - 1 sets - 3 reps - 10-20 sec hold

## 2020-12-20 NOTE — Therapy (Signed)
[facility] [address] [place], [place], [address] Phone: [phone]   Fax:  [phone]  Physical Therapy Evaluation  Patient Details  Name: Jeremy Grant MRN: [id number] Date of Birth: 25-Sep-1960 Referring Provider (PT): Frann Rider   Encounter Date: 12/20/2020   PT End of Session - 12/20/20 0929     Visit Number 1    Number of Visits 17    Date for PT Re-Evaluation 02/14/21    PT Start Time 0925    PT Stop Time 1011    PT Time Calculation (min) 46 min    Activity Tolerance Patient tolerated treatment well    Behavior During Therapy [facility] for tasks assessed/performed             Past Medical History:  Diagnosis Date   Arthritis    CHF (congestive heart failure) (HCC)    Combined systolic and diastolic cardiac dysfunction    Echo 11/03/2013 EF 85%, grade 3 diastolic dysfunction   Hypertension    NICM (nonischemic cardiomyopathy) ([place])    a. L/RHC (11/05/13): RA: 3, RV 52/5, PA 49/19 (31), PCWP 10, AO 166/93, PA 67%, Fick CO/CI: 5.71/2.97, Lmain: normal, LAD: large, without signficant dz, first diagonal has 20% dz at ostium, LCx: normal, RCA: 30% stenosis at the bifurcation of PDA and PLOM   Stroke [facility])     Past Surgical History:  Procedure Laterality Date   BIOPSY  01/31/2019   Procedure: BIOPSY;  Surgeon: Otis Brace, MD;  Location: [facility] ENDOSCOPY;  Service: Gastroenterology;;   ESOPHAGOGASTRODUODENOSCOPY N/A 01/31/2019   Procedure: ESOPHAGOGASTRODUODENOSCOPY (EGD);  Surgeon: Otis Brace, MD;  Location: [facility] ENDOSCOPY;  Service: Gastroenterology;  Laterality: N/A;   IR FLUORO GUIDE CV LINE RIGHT  07/25/2018   IR US GUIDE VASC ACCESS RIGHT  07/25/2018   LEFT AND RIGHT HEART CATHETERIZATION WITH CORONARY ANGIOGRAM N/A 11/05/2013   Procedure: LEFT AND RIGHT HEART CATHETERIZATION WITH CORONARY ANGIOGRAM;  Surgeon: Peter M Martinique, MD;  Location: [facility] CATH LAB;  Service: Cardiovascular;  Laterality: N/A;    RIGHT HEART CATHETERIZATION N/A 01/14/2014   Procedure: RIGHT HEART CATH;  Surgeon: Larey Dresser, MD;  Location: [facility] CATH LAB;  Service: Cardiovascular;  Laterality: N/A;    Vitals:   12/20/20 0929  BP: (!) 190/100      Subjective Assessment - 12/20/20 0929     Subjective Pt returns to PT with reports of issues with left foot. Known to PT from prior CVA with left hemiparesis. He had a fall back in December when caught left foot on grocery store but more recently it is just getting worse with feeling tighter and numbness in bottom of foot. Pt walks with left posterior AFO when outside and with cane. Inside he does not use them. Pt is still working on getting on kidney transplant list.    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 CHF, HTN, hyperlipidemia, glomerulonephritis, pancytopenia/chronic anemia, seizure left-sided post stroke    Currently in Pain? Yes    Pain Score 3     Pain Location Leg    Pain Orientation Left    Pain Descriptors / Indicators Throbbing;Numbness    Pain Type Chronic pain    Pain Onset More than a month ago    Pain Frequency Intermittent    Aggravating Factors  Being in one position for too long and then changing.  [facility] PT Assessment - 12/20/20 0934       Assessment   Medical Diagnosis post stroke left spastic hemiparesis    Referring Provider (PT) Frann Rider    Onset Date/Surgical Date 11/23/20    Hand Dominance Right    Prior Therapy outpatient PT      Precautions   Precautions Fall;Other (comment)    Precaution Comments seizures (on meds now)    Required Braces or Orthoses Other Brace/Splint    Other Brace/Splint left posterior AFO      Balance Screen   Has the patient fallen in the past 6 months No    Has the patient had a decrease in activity level because of a fear of falling?  Yes    Is the patient reluctant to leave their home  because of a fear of falling?  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 Trying to get disability.   Leisure fishing, working in garden      Cognition   Overall Cognitive Status Within [place] for tasks assessed      Observation/Other Assessments   Observations Pt reports vision has not been as good and is following up with eye doctor. Has not been driving as much do to this.    Skin Integrity bruising on arms      Sensation   Light Touch Impaired Detail    Proprioception Appears Intact    Additional Comments pt has decreased light touch along lateral calf on left and lateral foot.      ROM / Strength   AROM / PROM / Strength AROM;PROM;Strength      AROM   AROM Assessment Site Ankle    Right/Left Ankle Left    Left Ankle Dorsiflexion -14      PROM   Overall PROM Comments Pt has decreased left gastroc length, left hamstring and hip extension. Need to further assess next visit.    PROM Assessment Site Ankle    Right/Left Ankle Left    Left Ankle Dorsiflexion 0      Strength   Strength Assessment Site Hip;Knee;Ankle    Right/Left Hip Right;Left    Right Hip Flexion 5/5    Right Hip Extension 4+/5    Right Hip ABduction 4+/5    Left Hip Flexion 4/5    Left Hip Extension 2/5    Left Hip ABduction 2+/5    Right/Left Knee Right;Left    Right Knee Flexion 5/5    Right Knee Extension 5/5    Left Knee Flexion 3+/5    Left Knee Extension 4/5    Right/Left Ankle Right;Left    Right Ankle Dorsiflexion 5/5    Right Ankle Plantar Flexion 5/5    Left Ankle Dorsiflexion 2-/5    Left  Ankle Plantar Flexion 2-/5    Left Ankle Inversion 1/5    Left Ankle Eversion 1/5      Transfers   Transfers Sit to Stand;Stand to Sit    Sit to Stand 6: Modified independent (Device/Increase  time)    Stand to Sit 6: Modified independent (Device/Increase time)    Comments Pushes up with hands with increased time to rise.      Ambulation/Gait   Ambulation/Gait Yes    Ambulation/Gait Assistance 6: Modified independent (Device/Increase time);5: Supervision    Ambulation/Gait Assistance Details lacking toe off on left    Ambulation Distance (Feet) 40 Feet    Assistive device --   left AFO   Gait Pattern Step-to pattern;Decreased step length - right;Decreased step length - left;Decreased stance time - left;Decreased hip/knee flexion - left;Decreased dorsiflexion - left    Ambulation Surface Level    Gait velocity 15.19 sec=0.33ms with cane, with AFO                        Objective measurements completed on examination: See above findings.               PT Education - 12/20/20 1024     Education Details PT plan of care. Reviewed standing gastroc stretch and modified thomas stretch off edge of bed from prior HEP and reissued pictures to be sure pt had. Discussed stretching 3-4 x/day with 30 sec to 1 min holds x 4 reps. Also educated on closely monitoring BP and letting MD know it has been running very high. Will not be able to participate in PT if too high. Instructed to check prior to coming next time and if >180/95 to call and cancel.    Person(s) Educated Patient;Spouse    Methods Explanation;Demonstration;Handout    Comprehension Verbalized understanding              PT Short Term Goals - 12/20/20 1038       PT SHORT TERM GOAL #1   Title Pt will be independent with HEP for flexibility, strength and balance to continue gains on own.    Time 4    Period Weeks    Status New    Target Date 01/17/21      PT SHORT TERM GOAL #2   Title Pt will  ambulate 500' on level indoor surfaces without AFO for improved gait and strength independently.    Time 4    Period Weeks    Status New    Target Date 01/17/21      PT SHORT TERM GOAL #3   Title Pt will be assessed for new AFO by orthotist as current one is worn down.    Time 4    Period Weeks    Status New    Target Date 01/17/21      PT SHORT TERM GOAL #4   Title FGA will be assessed and LTG written.    Time 4    Period Weeks    Status New    Target Date 01/17/21               PT Long Term Goals - 12/20/20 1042       PT LONG TERM GOAL #1   Title Pt will ambulate >1000' without AD  on varied surfaces mod I for improved community mobility and activity tolerance.    Time 8    Period Weeks    Status New    Target Date 02/14/21      PT LONG TERM GOAL #2   Title FGA goal TBD.    Time 8    Period Weeks    Status New    Target Date 02/14/21  PT LONG TERM GOAL #3   Title Pt will increase left ankle DF PROM to >5 degrees for improved mobility.    Baseline 12/20/20 0 degrees    Time 8    Period Weeks    Status New    Target Date 02/14/21      PT LONG TERM GOAL #4   Title 5 x sit to stand goal TBD.    Time 8    Period Weeks    Status New    Target Date 02/14/21      PT LONG TERM GOAL #5   Title Pt will increase gait speed from 0.65 m/s to >0.8 m/s for improved gait safety.    Baseline 12/20/20 0.43ms without cane, with AFO    Time 8    Period Weeks    Status New    Target Date 02/14/21                    Plan - 12/20/20 1026     Clinical Impression Statement Pt is 60y/o male with history of multiple areas of infarct including R.Corpus callosum infarct, subacute and chronic left frontal MCA infarcts from 12/2018. Pt also has PMH of CHF, HTN, hyperlipidemia, glomerulonephritis, pancytopenia/chronic anemia, seizure left-sided post stroke. Pt returns as feeling that left foot is not doing as well with feeling tighter and having numbness. Also just  generally feeling more deconditioned. Pt's left AFO is starting to have issues with the velcro and may need to be updated as well. Pt does have left sided hemiparesis with biggest limitation at left ankle. Also has some impaired sensation in left lower leg and foot. Pt is ambulating with gait speed of 0.668m when does not use cane indicating decreased safety with community ambulation. Eval was limited today due to elevated BP and will need to do further testing on functional activities and balance next visit.    Personal Factors and Comorbidities Time since onset of injury/illness/exacerbation;Comorbidity 3+    Comorbidities CHF, HTN, hyperlipidemia, glomerulonephritis, pancytopenia/chronic anemia, seizure left-sided post stroke    Examination-Activity Limitations Locomotion Level;Transfers;Stand;Stairs    Examination-Participation Restrictions Community Activity;Yard Work    [employer] officertable/Uncomplicated    Clinical Decision Making Moderate    Rehab Potential Good    PT Frequency 2x / week   plus eval   PT Duration 8 weeks    PT Treatment/Interventions ADLs/Self Care Home Management;DME Instruction;Gait training;Stair training;Functional mobility training;Therapeutic activities;Therapeutic exercise;Balance training;Neuromuscular re-education;Manual techniques;Passive range of motion;Orthotic Fit/Training;Patient/family education;Vestibular    PT Next Visit Plan Further assess gait with and without AFO, FGA, stairs, 5 x sit to stand. Hamstring and hip flexor length. Begin stretching at left ankle, hamstring and hip flexors.    Consulted and Agree with Plan of Care Patient;Family member/caregiver    Family Member Consulted Wife, BrHassan Rowan           Patient will benefit from skilled therapeutic intervention in order to improve the following deficits and impairments:  Abnormal gait, Cardiopulmonary status limiting activity, Decreased balance, Decreased activity tolerance,  Decreased mobility, Decreased strength, Impaired sensation, Decreased range of motion, Impaired flexibility  Visit Diagnosis: Other abnormalities of gait and mobility  Hemiplegia and hemiparesis following cerebral infarction affecting left non-dominant side (HCC)  Muscle weakness (generalized)  Unsteadiness on feet     Problem List Patient Active Problem List   Diagnosis Date Noted   Essential hypertension 05/25/2019   Spasm 03/23/2019   Acute blood loss anemia  Steroid-induced hyperglycemia    Acute on chronic anemia    Seizures (HCC)    Spastic hemiparesis (HCC)    ANCA-associated vasculitis (HCC)    Hypertension    Thrombocytopenia (HCC)    Acute unilateral cerebral infarction in a watershed distribution ([place]) 01/15/2019   Weakness    CVA (cerebral vascular accident) ([place]) 01/12/2019   Malnutrition of moderate degree 07/31/2018   Elevated serum protein level    Multiple myeloma (HCC)    Pancytopenia (HCC)    Normocytic normochromic anemia 07/16/2018   Acute kidney failure ([place]) 07/16/2018   NICM (nonischemic cardiomyopathy) ([place]) 11/12/2013   History of ETOH abuse 11/12/2013   Chronic combined systolic and diastolic CHF (congestive heart failure) ([place]) 11/12/2013   Congestive dilated cardiomyopathy ([place]) 11/04/2013   Malignant hypertension     Electa Sniff, PT, DPT, NCS 12/20/2020, 10:47 AM  [facility] [address] [place], [place], [address] Phone: [phone]   Fax:  [phone]  Name: Jeremy Grant MRN: [id number] Date of Birth: 12-14-1960

## 2020-12-21 ENCOUNTER — Telehealth: Payer: Self-pay

## 2020-12-21 DIAGNOSIS — I69354 Hemiplegia and hemiparesis following cerebral infarction affecting left non-dominant side: Secondary | ICD-10-CM

## 2020-12-21 NOTE — Telephone Encounter (Signed)
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  Pt will benefit from new Left AFO in order to improve safety with functional mobility. Current AFO is >61 years old and straps/velcro in need of repair.   If you agree, please submit request in EPIC under MD Order, Other Orders (list left AFO in comments) or fax to [facility] at [phone].   Thank you, Cherly Anderson, PT, DPT, [facility] [address] [place], [place]  [address] Phone:  [phone] Fax:  [phone]

## 2020-12-21 NOTE — Telephone Encounter (Signed)
Left AFO order faxed to Raquel Sarna, PT with [facility]. Received confirmation.

## 2020-12-28 ENCOUNTER — Ambulatory Visit: Payer: 59

## 2020-12-28 ENCOUNTER — Other Ambulatory Visit: Payer: Self-pay

## 2020-12-28 VITALS — BP 158/88

## 2020-12-28 DIAGNOSIS — M6281 Muscle weakness (generalized): Secondary | ICD-10-CM

## 2020-12-28 DIAGNOSIS — R2689 Other abnormalities of gait and mobility: Secondary | ICD-10-CM | POA: Diagnosis not present

## 2020-12-28 DIAGNOSIS — R2681 Unsteadiness on feet: Secondary | ICD-10-CM

## 2020-12-28 NOTE — Therapy (Signed)
[facility] [address] [place], [place], [address] Phone: [phone]   Fax:  [phone]  Physical Therapy Treatment  Patient Details  Name: Jeremy Grant MRN: [id number] Date of Birth: 05-08-1961 Referring Provider (PT): Frann Rider   Encounter Date: 12/28/2020   PT End of Session - 12/28/20 0848     Visit Number 2    Number of Visits 17    Date for PT Re-Evaluation 02/14/21    PT Start Time 0844    PT Stop Time 0931    PT Time Calculation (min) 47 min    Equipment Utilized During Treatment Gait belt    Activity Tolerance Patient tolerated treatment well    Behavior During Therapy [facility] for tasks assessed/performed             Past Medical History:  Diagnosis Date   Arthritis    CHF (congestive heart failure) (HCC)    Combined systolic and diastolic cardiac dysfunction    Echo 11/03/2013 EF 81%, grade 3 diastolic dysfunction   Hypertension    NICM (nonischemic cardiomyopathy) ([place])    a. L/RHC (11/05/13): RA: 3, RV 52/5, PA 49/19 (31), PCWP 10, AO 166/93, PA 67%, Fick CO/CI: 5.71/2.97, Lmain: normal, LAD: large, without signficant dz, first diagonal has 20% dz at ostium, LCx: normal, RCA: 30% stenosis at the bifurcation of PDA and PLOM   Stroke [facility])     Past Surgical History:  Procedure Laterality Date   BIOPSY  01/31/2019   Procedure: BIOPSY;  Surgeon: Otis Brace, MD;  Location: [place] ENDOSCOPY;  Service: Gastroenterology;;   ESOPHAGOGASTRODUODENOSCOPY N/A 01/31/2019   Procedure: ESOPHAGOGASTRODUODENOSCOPY (EGD);  Surgeon: Otis Brace, MD;  Location: [facility] ENDOSCOPY;  Service: Gastroenterology;  Laterality: N/A;   IR FLUORO GUIDE CV LINE RIGHT  07/25/2018   IR US GUIDE VASC ACCESS RIGHT  07/25/2018   LEFT AND RIGHT HEART CATHETERIZATION WITH CORONARY ANGIOGRAM N/A 11/05/2013   Procedure: LEFT AND RIGHT HEART CATHETERIZATION WITH CORONARY ANGIOGRAM;  Surgeon: Peter M Martinique, MD;  Location: [facility]  CATH LAB;  Service: Cardiovascular;  Laterality: N/A;   RIGHT HEART CATHETERIZATION N/A 01/14/2014   Procedure: RIGHT HEART CATH;  Surgeon: Larey Dresser, MD;  Location: [facility] CATH LAB;  Service: Cardiovascular;  Laterality: N/A;    Vitals:   12/28/20 0851  BP: (!) 158/88     Subjective Assessment - 12/28/20 0849     Subjective Pt's cardura was increased by nephrologist. Reports BP was better this morning but may be up after driving here. Has been doing more exercises and walking.    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 CHF, HTN, hyperlipidemia, glomerulonephritis, pancytopenia/chronic anemia, seizure left-sided post stroke    Currently in Pain? No/denies    Pain Onset More than a month ago                [facility] PT Assessment - 12/28/20 0850       Flexibility   Soft Tissue Assessment /Muscle Length yes   hip flexor left~-10, right hip flexion ~ 0 degrees   Hamstrings right ~45 degrees and left ~30      Functional Gait  Assessment   Gait assessed  Yes    Gait Level Surface Walks 20 ft, slow speed, abnormal gait pattern, evidence for imbalance or deviates 10-15 in outside of the 12 in walkway width. Requires more than 7 sec to ambulate 20 ft.  Change in Gait Speed Makes only minor adjustments to walking speed, or accomplishes a change in speed with significant gait deviations, deviates 10-15 in outside the 12 in walkway width, or changes speed but loses balance but is able to recover and continue walking.    Gait with Horizontal Head Turns Performs head turns smoothly with slight change in gait velocity (eg, minor disruption to smooth gait path), deviates 6-10 in outside 12 in walkway width, or uses an assistive device.    Gait with Vertical Head Turns Performs task with slight change in gait velocity (eg, minor disruption to smooth gait path), deviates 6 - 10 in outside 12 in walkway width or  uses assistive device    Gait and Pivot Turn Pivot turns safely in greater than 3 sec and stops with no loss of balance, or pivot turns safely within 3 sec and stops with mild imbalance, requires small steps to catch balance.    Step Over Obstacle Is able to step over one shoe box (4.5 in total height) but must slow down and adjust steps to clear box safely. May require verbal cueing.    Gait with Narrow Base of Support Ambulates less than 4 steps heel to toe or cannot perform without assistance.    Gait with Eyes Closed Walks 20 ft, slow speed, abnormal gait pattern, evidence for imbalance, deviates 10-15 in outside 12 in walkway width. Requires more than 9 sec to ambulate 20 ft.    Ambulating Backwards Walks 20 ft, uses assistive device, slower speed, mild gait deviations, deviates 6-10 in outside 12 in walkway width.    Steps Two feet to a stair, must use rail.    Total Score 13    FGA comment: performed with left AFO donned                           [facility] Adult PT Treatment/Exercise - 12/28/20 0850       Transfers   Transfers Sit to Stand;Stand to Sit    Sit to Stand 6: Modified independent (Device/Increase time);With upper extremity assist    Stand to Sit 6: Modified independent (Device/Increase time);With upper extremity assist      Ambulation/Gait   Ambulation/Gait Yes    Ambulation/Gait Assistance 5: Supervision    Ambulation/Gait Assistance Details Pt was cued to increase stance time and weight shift on LLE to increase right step and be more gentle with right step. Lacking hip extension and toe off on left.    Ambulation Distance (Feet) 345 Feet    Assistive device None    Gait Pattern Step-through pattern;Decreased stance time - left;Decreased step length - right    Ambulation Surface Level;Indoor      Neuro Re-ed    Neuro Re-ed Details  Pt performed dynamic gait with reciprocal stepping over varied height hurdles with no AFO with cane CGA. Cued to control  steps tightening gluts in stance. Performed x 6 laps. In // bars: stepping up on rockerboard positioned ant/post with LLE with fingertip support at times x 10 with verbal cues to weight shift on left and take his time close SBA.                    PT Education - 12/28/20 0936     Education Details Results of testing. Verbally reviewed modified Thomas stretch and seated hamstring stretch and emphasized performing daily. Gave pt AFO order but will also fax to [employer].  Person(s) Educated Patient    Methods Explanation;Demonstration    Comprehension Verbalized understanding              PT Short Term Goals - 12/28/20 0937       PT SHORT TERM GOAL #1   Title Pt will be independent with HEP for flexibility, strength and balance to continue gains on own.    Time 4    Period Weeks    Status New    Target Date 01/17/21      PT SHORT TERM GOAL #2   Title Pt will ambulate 500' on level indoor surfaces without AFO for improved gait and strength independently.    Time 4    Period Weeks    Status New    Target Date 01/17/21      PT SHORT TERM GOAL #3   Title Pt will be assessed for new AFO by orthotist as current one is worn down.    Time 4    Period Weeks    Status New    Target Date 01/17/21      PT SHORT TERM GOAL #4   Title FGA will be assessed and LTG written.    Baseline FGA with left AFO assessed on 12/28/20 and LTG written    Time 4    Period Weeks    Status Achieved    Target Date 01/17/21               PT Long Term Goals - 12/28/20 0938       PT LONG TERM GOAL #1   Title Pt will ambulate >1000' without AD  on varied surfaces mod I for improved community mobility and activity tolerance.    Time 8    Period Weeks    Status New      PT LONG TERM GOAL #2   Title Pt will increase FGA from 13 to >19/30 for improved gait safety and balance.    Baseline 12/28/20 13/30    Time 8    Period Weeks    Status New      PT LONG TERM GOAL #3   Title Pt  will increase left ankle DF PROM to >5 degrees for improved mobility.    Baseline 12/20/20 0 degrees    Time 8    Period Weeks    Status New      PT LONG TERM GOAL #4   Title 5 x sit to stand goal TBD.    Time 8    Period Weeks    Status New      PT LONG TERM GOAL #5   Title Pt will increase gait speed from 0.65 m/s to >0.8 m/s for improved gait safety.    Baseline 12/20/20 0.56ms without cane, with AFO    Time 8    Period Weeks    Status New                   Plan - 12/28/20 0939     Clinical Impression Statement Pt's BP was better than evaluation with medication change from MD. PT able to assess FGA and score does indicate fall risk at 13/30. This is decrease from when last performed in fall time. Pt mostly moving slower. Decreased hamstring length and hip flexor length bilateral with Left less than right. Pt was able to ambulate without AFO in clinic with no LOB but does have decreased stance time on left.    Personal Factors and Comorbidities  Time since onset of injury/illness/exacerbation;Comorbidity 3+    Comorbidities CHF, HTN, hyperlipidemia, glomerulonephritis, pancytopenia/chronic anemia, seizure left-sided post stroke    Examination-Activity Limitations Locomotion Level;Transfers;Stand;Stairs    Examination-Participation Restrictions Community Activity;Yard Work    Stability/Clinical Decision Making Stable/Uncomplicated    Rehab Potential Good    PT Frequency 2x / week   plus eval   PT Duration 8 weeks    PT Treatment/Interventions ADLs/Self Care Home Management;DME Instruction;Gait training;Stair training;Functional mobility training;Therapeutic activities;Therapeutic exercise;Balance training;Neuromuscular re-education;Manual techniques;Passive range of motion;Orthotic Fit/Training;Patient/family education;Vestibular    PT Next Visit Plan Monitor BP. 5 x sit to stand for LTG.  Continue stretching at left ankle, hamstring and hip flexors. Dynamic gait and balance  activities without AFO. Rockerboard for balance and more ankle motion.    Consulted and Agree with Plan of Care Patient;Family member/caregiver    Family Member Consulted Wife, [name]             Patient will benefit from skilled therapeutic intervention in order to improve the following deficits and impairments:  Abnormal gait, Cardiopulmonary status limiting activity, Decreased balance, Decreased activity tolerance, Decreased mobility, Decreased strength, Impaired sensation, Decreased range of motion, Impaired flexibility  Visit Diagnosis: Other abnormalities of gait and mobility  Muscle weakness (generalized)  Unsteadiness on feet     Problem List Patient Active Problem List   Diagnosis Date Noted   Essential hypertension 05/25/2019   Spasm 03/23/2019   Acute blood loss anemia    Steroid-induced hyperglycemia    Acute on chronic anemia    Seizures (HCC)    Spastic hemiparesis (HCC)    ANCA-associated vasculitis (HCC)    Hypertension    Thrombocytopenia (HCC)    Acute unilateral cerebral infarction in a watershed distribution ([place]) 01/15/2019   Weakness    CVA (cerebral vascular accident) ([place]) 01/12/2019   Malnutrition of moderate degree 07/31/2018   Elevated serum protein level    Multiple myeloma (HCC)    Pancytopenia (HCC)    Normocytic normochromic anemia 07/16/2018   Acute kidney failure ([place]) 07/16/2018   NICM (nonischemic cardiomyopathy) ([place]) 11/12/2013   History of ETOH abuse 11/12/2013   Chronic combined systolic and diastolic CHF (congestive heart failure) ([place]) 11/12/2013   Congestive dilated cardiomyopathy ([place]) 11/04/2013   Malignant hypertension     Electa Sniff, PT, DPT, NCS 12/28/2020, 9:44 AM  [place] [address] [place], [place], [address] Phone: [phone]   Fax:  [phone]  Name: Jeremy Grant MRN: [id number] Date of Birth: August 01, 1960

## 2020-12-30 ENCOUNTER — Ambulatory Visit: Payer: 59

## 2020-12-30 ENCOUNTER — Other Ambulatory Visit: Payer: Self-pay

## 2020-12-30 DIAGNOSIS — R2681 Unsteadiness on feet: Secondary | ICD-10-CM

## 2020-12-30 DIAGNOSIS — R2689 Other abnormalities of gait and mobility: Secondary | ICD-10-CM | POA: Diagnosis not present

## 2020-12-30 DIAGNOSIS — M6281 Muscle weakness (generalized): Secondary | ICD-10-CM

## 2020-12-30 NOTE — Therapy (Signed)
[place] [address] [place], [place], [address] Phone: [phone]   Fax:  [phone]  Physical Therapy Treatment  Patient Details  Name: Jeremy Grant MRN: [id number] Date of Birth: 1961-04-13 Referring Provider (PT): Frann Rider   Encounter Date: 12/30/2020   PT End of Session - 12/30/20 0852     Visit Number 3    Number of Visits 17    Date for PT Re-Evaluation 02/14/21    PT Start Time 0846    PT Stop Time 0930    PT Time Calculation (min) 44 min    Equipment Utilized During Treatment Gait belt    Activity Tolerance Patient tolerated treatment well    Behavior During Therapy [facility] for tasks assessed/performed             Past Medical History:  Diagnosis Date   Arthritis    CHF (congestive heart failure) ([place])    Combined systolic and diastolic cardiac dysfunction    Echo 11/03/2013 EF 69%, grade 3 diastolic dysfunction   Hypertension    NICM (nonischemic cardiomyopathy) ([place])    a. L/RHC (11/05/13): RA: 3, RV 52/5, PA 49/19 (31), PCWP 10, AO 166/93, PA 67%, Fick CO/CI: 5.71/2.97, Lmain: normal, LAD: large, without signficant dz, first diagonal has 20% dz at ostium, LCx: normal, RCA: 30% stenosis at the bifurcation of PDA and PLOM   Stroke [facility])     Past Surgical History:  Procedure Laterality Date   BIOPSY  01/31/2019   Procedure: BIOPSY;  Surgeon: Otis Brace, MD;  Location: [place] ENDOSCOPY;  Service: Gastroenterology;;   ESOPHAGOGASTRODUODENOSCOPY N/A 01/31/2019   Procedure: ESOPHAGOGASTRODUODENOSCOPY (EGD);  Surgeon: Otis Brace, MD;  Location: [facility] ENDOSCOPY;  Service: Gastroenterology;  Laterality: N/A;   IR FLUORO GUIDE CV LINE RIGHT  07/25/2018   IR US GUIDE VASC ACCESS RIGHT  07/25/2018   LEFT AND RIGHT HEART CATHETERIZATION WITH CORONARY ANGIOGRAM N/A 11/05/2013   Procedure: LEFT AND RIGHT HEART CATHETERIZATION WITH CORONARY ANGIOGRAM;  Surgeon: Peter M Martinique, MD;  Location: [facility]  CATH LAB;  Service: Cardiovascular;  Laterality: N/A;   RIGHT HEART CATHETERIZATION N/A 01/14/2014   Procedure: RIGHT HEART CATH;  Surgeon: Larey Dresser, MD;  Location: [facility] CATH LAB;  Service: Cardiovascular;  Laterality: N/A;    There were no vitals filed for this visit.   Subjective Assessment - 12/30/20 0852     Subjective Pt reports that he did some stretching yesterday for hip flexor and hamstring and really felt it.    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 CHF, HTN, hyperlipidemia, glomerulonephritis, pancytopenia/chronic anemia, seizure left-sided post stroke    Currently in Pain? No/denies    Pain Onset More than a month ago                               [facility] Adult PT Treatment/Exercise - 12/30/20 0853       Ambulation/Gait   Ambulation/Gait Yes    Ambulation/Gait Assistance 5: Supervision    Ambulation/Gait Assistance Details around in clinic between activities without AFO    Assistive device None    Gait Pattern Step-through pattern;Decreased stance time - left;Decreased step length - right    Ambulation Surface Level;Indoor      Neuro Re-ed    Neuro Re-ed Details  Dynamic gait activities in hallway: Marching gait 40' x 4, backwards gait 40' x  2. Verbal cues to take gentle step with right and increase left step with posterior gait. On trampoline: small hops to try to get more PF holding bar x 10, marching in place with 1 UE support x 10, tapping top step of stairs in front with RLE x 10 with 1 UE support to increase left stance time, marching x 10 without UE support CGA. In // bars: SLS on left with right foot on soccerball 20 sec x 2 CGA with cues to tighten gluts to help stabilize on left with occasional touching bar. Moving soccerball forward/back 10 x 2 and side to side 10 x 2 for increased left SLS time. Sanding on rockerboard positioned ant/post maintaining level  eyes open x 30 sec then eyes closed x 30 sec then turned lateral and repeated. CGA with pt having to touch at times with eyes closed with increased sway. BP= 132/88 at end.                      PT Short Term Goals - 12/28/20 4679       PT SHORT TERM GOAL #1   Title Pt will be independent with HEP for flexibility, strength and balance to continue gains on own.    Time 4    Period Weeks    Status New    Target Date 01/17/21      PT SHORT TERM GOAL #2   Title Pt will ambulate 500' on level indoor surfaces without AFO for improved gait and strength independently.    Time 4    Period Weeks    Status New    Target Date 01/17/21      PT SHORT TERM GOAL #3   Title Pt will be assessed for new AFO by orthotist as current one is worn down.    Time 4    Period Weeks    Status New    Target Date 01/17/21      PT SHORT TERM GOAL #4   Title FGA will be assessed and LTG written.    Baseline FGA with left AFO assessed on 12/28/20 and LTG written    Time 4    Period Weeks    Status Achieved    Target Date 01/17/21               PT Long Term Goals - 12/28/20 0938       PT LONG TERM GOAL #1   Title Pt will ambulate >1000' without AD  on varied surfaces mod I for improved community mobility and activity tolerance.    Time 8    Period Weeks    Status New      PT LONG TERM GOAL #2   Title Pt will increase FGA from 13 to >19/30 for improved gait safety and balance.    Baseline 12/28/20 13/30    Time 8    Period Weeks    Status New      PT LONG TERM GOAL #3   Title Pt will increase left ankle DF PROM to >5 degrees for improved mobility.    Baseline 12/20/20 0 degrees    Time 8    Period Weeks    Status New      PT LONG TERM GOAL #4   Title 5 x sit to stand goal TBD.    Time 8    Period Weeks    Status New      PT LONG TERM GOAL #5  Title Pt will increase gait speed from 0.65 m/s to >0.8 m/s for improved gait safety.    Baseline 12/20/20 0.14m/s without cane,  with AFO    Time 8    Period Weeks    Status New                   Plan - 12/30/20 6010     Clinical Impression Statement PT continued to focus on dynamic gait, balance and strengthening activities without AFO donned. Pt's left leg does fatigue as goes on. Pt reports that he does not trust leg as much since fall last December even though was not really due to leg that he fell having caught foot. Pt getting off left LE quicker but did improve some with practice. Continued to work on [employer] activities.    Personal Factors and Comorbidities Time since onset of injury/illness/exacerbation;Comorbidity 3+    Comorbidities CHF, HTN, hyperlipidemia, glomerulonephritis, pancytopenia/chronic anemia, seizure left-sided post stroke    Examination-Activity Limitations Locomotion Level;Transfers;Stand;Stairs    Examination-Participation Restrictions Community Activity;Yard Work    Stability/Clinical Decision Making Stable/Uncomplicated    Rehab Potential Good    PT Frequency 2x / week   plus eval   PT Duration 8 weeks    PT Treatment/Interventions ADLs/Self Care Home Management;DME Instruction;Gait training;Stair training;Functional mobility training;Therapeutic activities;Therapeutic exercise;Balance training;Neuromuscular re-education;Manual techniques;Passive range of motion;Orthotic Fit/Training;Patient/family education;Vestibular    PT Next Visit Plan Monitor BP. 5 x sit to stand for LTG.  Continue stretching at left ankle, hamstring and hip flexors. Dynamic gait and balance activities without AFO. Rockerboard for balance and more ankle motion.    Consulted and Agree with Plan of Care Patient;Family member/caregiver    Family Member Consulted Wife, [name]             Patient will benefit from skilled therapeutic intervention in order to improve the following deficits and impairments:  Abnormal gait, Cardiopulmonary status limiting activity, Decreased balance, Decreased activity tolerance,  Decreased mobility, Decreased strength, Impaired sensation, Decreased range of motion, Impaired flexibility  Visit Diagnosis: Other abnormalities of gait and mobility  Muscle weakness (generalized)  Unsteadiness on feet     Problem List Patient Active Problem List   Diagnosis Date Noted   Essential hypertension 05/25/2019   Spasm 03/23/2019   Acute blood loss anemia    Steroid-induced hyperglycemia    Acute on chronic anemia    Seizures (HCC)    Spastic hemiparesis (HCC)    ANCA-associated vasculitis (HCC)    Hypertension    Thrombocytopenia (HCC)    Acute unilateral cerebral infarction in a watershed distribution ([place]) 01/15/2019   Weakness    CVA (cerebral vascular accident) ([place]) 01/12/2019   Malnutrition of moderate degree 07/31/2018   Elevated serum protein level    Multiple myeloma (HCC)    Pancytopenia (HCC)    Normocytic normochromic anemia 07/16/2018   Acute kidney failure ([place]) 07/16/2018   NICM (nonischemic cardiomyopathy) ([place]) 11/12/2013   History of ETOH abuse 11/12/2013   Chronic combined systolic and diastolic CHF (congestive heart failure) ([place]) 11/12/2013   Congestive dilated cardiomyopathy ([place]) 11/04/2013   Malignant hypertension     Electa Sniff, PT, DPT, NCS 12/30/2020, 9:42 AM  [place] [address] [place], [place], [address] Phone: [phone]   Fax:  [phone]  Name: Jeremy Grant MRN: [id number] Date of Birth: Apr 09, 1961

## 2021-01-03 ENCOUNTER — Other Ambulatory Visit: Payer: Self-pay

## 2021-01-03 ENCOUNTER — Ambulatory Visit: Payer: 59

## 2021-01-03 VITALS — BP 148/80

## 2021-01-03 DIAGNOSIS — R2689 Other abnormalities of gait and mobility: Secondary | ICD-10-CM

## 2021-01-03 DIAGNOSIS — R2681 Unsteadiness on feet: Secondary | ICD-10-CM

## 2021-01-03 DIAGNOSIS — M6281 Muscle weakness (generalized): Secondary | ICD-10-CM

## 2021-01-03 NOTE — Therapy (Signed)
[facility] [address] [place], [place], [address] Phone: [phone]   Fax:  [phone]  Physical Therapy Treatment  Patient Details  Name: Jeremy Grant MRN: [id number] Date of Birth: May 20, 1960 Referring Provider (PT): Frann Rider   Encounter Date: 01/03/2021   PT End of Session - 01/03/21 1016     Visit Number 4    Number of Visits 17    Date for PT Re-Evaluation 02/14/21    PT Start Time 3532    PT Stop Time 1100    PT Time Calculation (min) 45 min    Equipment Utilized During Treatment Gait belt    Activity Tolerance Patient tolerated treatment well    Behavior During Therapy [facility] for tasks assessed/performed             Past Medical History:  Diagnosis Date   Arthritis    CHF (congestive heart failure) ([place])    Combined systolic and diastolic cardiac dysfunction    Echo 11/03/2013 EF 99%, grade 3 diastolic dysfunction   Hypertension    NICM (nonischemic cardiomyopathy) ([place])    a. L/RHC (11/05/13): RA: 3, RV 52/5, PA 49/19 (31), PCWP 10, AO 166/93, PA 67%, Fick CO/CI: 5.71/2.97, Lmain: normal, LAD: large, without signficant dz, first diagonal has 20% dz at ostium, LCx: normal, RCA: 30% stenosis at the bifurcation of PDA and PLOM   Stroke [facility])     Past Surgical History:  Procedure Laterality Date   BIOPSY  01/31/2019   Procedure: BIOPSY;  Surgeon: Otis Brace, MD;  Location: [place] ENDOSCOPY;  Service: Gastroenterology;;   ESOPHAGOGASTRODUODENOSCOPY N/A 01/31/2019   Procedure: ESOPHAGOGASTRODUODENOSCOPY (EGD);  Surgeon: Otis Brace, MD;  Location: [facility] ENDOSCOPY;  Service: Gastroenterology;  Laterality: N/A;   IR FLUORO GUIDE CV LINE RIGHT  07/25/2018   IR US GUIDE VASC ACCESS RIGHT  07/25/2018   LEFT AND RIGHT HEART CATHETERIZATION WITH CORONARY ANGIOGRAM N/A 11/05/2013   Procedure: LEFT AND RIGHT HEART CATHETERIZATION WITH CORONARY ANGIOGRAM;  Surgeon: Peter M Martinique, MD;  Location: [facility]  CATH LAB;  Service: Cardiovascular;  Laterality: N/A;   RIGHT HEART CATHETERIZATION N/A 01/14/2014   Procedure: RIGHT HEART CATH;  Surgeon: Larey Dresser, MD;  Location: [facility] CATH LAB;  Service: Cardiovascular;  Laterality: N/A;    Vitals:   01/03/21 1021  BP: (!) 148/80     Subjective Assessment - 01/03/21 1016     Subjective Pt reports he is doing ok. He did have some spasms in left calf last night.    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 CHF, HTN, hyperlipidemia, glomerulonephritis, pancytopenia/chronic anemia, seizure left-sided post stroke    Currently in Pain? No/denies    Pain Onset More than a month ago                               [facility] Adult PT Treatment/Exercise - 01/03/21 1017       Transfers   Transfers Sit to Stand;Stand to Sit    Sit to Stand 6: Modified independent (Device/Increase time);5: Supervision    Stand to Sit 6: Modified independent (Device/Increase time)    Comments Pt performed 3 sit to stands without hands but unable to complete 5 from standard chair. Needs a couple tries to rise and verbal cues to lean forwards.      Ambulation/Gait   Ambulation/Gait Yes    Ambulation/Gait  Assistance 5: Supervision    Ambulation/Gait Assistance Details around clinic without AFO    Ambulation Distance (Feet) 115 Feet    Assistive device None    Gait Pattern Step-through pattern;Decreased step length - right    Ambulation Surface Level;Indoor    Gait Comments Treadmill x 5 min level 1.2. Seated break to bandage up left arm that had started to bleed then repeated 5 min minutes at 1.2pmh. BP=148/82 atfer. Pt was cued to increase step lengths on treadmill and stand tall. Also to be more gentle with right step. UE support throughout. Pt reported being fatigued after each 5 minutes.      Neuro Re-ed    Neuro Re-ed Details  At bottom of steps: standing on  rockerboard positioned ant/post trying to maintain level eyes open x 30 sec then eyes closed 30 sec x 2 without UE support CGA. Tapping second step with RLE with fingertip support on right x 20.                      PT Short Term Goals - 01/03/21 1641       PT SHORT TERM GOAL #1   Title Pt will be independent with HEP for flexibility, strength and balance to continue gains on own.    Time 4    Period Weeks    Status New    Target Date 01/17/21      PT SHORT TERM GOAL #2   Title Pt will ambulate 500' on level indoor surfaces without AFO for improved gait and strength independently.    Time 4    Period Weeks    Status New    Target Date 01/17/21      PT SHORT TERM GOAL #3   Title Pt will be assessed for new AFO by orthotist as current one is worn down.    Time 4    Period Weeks    Status New    Target Date 01/17/21      PT SHORT TERM GOAL #4   Title FGA will be assessed and LTG written.    Baseline FGA with left AFO assessed on 12/28/20 and LTG written    Time 4    Period Weeks    Status Achieved    Target Date 01/17/21               PT Long Term Goals - 01/03/21 1641       PT LONG TERM GOAL #1   Title Pt will ambulate >1000' without AD  on varied surfaces mod I for improved community mobility and activity tolerance.    Time 8    Period Weeks    Status New      PT LONG TERM GOAL #2   Title Pt will increase FGA from 13 to >19/30 for improved gait safety and balance.    Baseline 12/28/20 13/30    Time 8    Period Weeks    Status New      PT LONG TERM GOAL #3   Title Pt will increase left ankle DF PROM to >5 degrees for improved mobility.    Baseline 12/20/20 0 degrees    Time 8    Period Weeks    Status New      PT LONG TERM GOAL #4   Title Pt will be able to complete 5 x sit to stand for improved balance and functional strength without hands.    Baseline 01/03/21 3 reps  completed.    Time 8    Period Weeks    Status New      PT LONG TERM  GOAL #5   Title Pt will increase gait speed from 0.65 m/s to >0.8 m/s for improved gait safety.    Baseline 12/20/20 0.27ms without cane, with AFO    Time 8    Period Weeks    Status New                   Plan - 01/03/21 1642     Clinical Impression Statement PT focused more on gait and improving activity tolerance utilizing treadmill today. Pt did fatigue quickly but BP responded well.    Personal Factors and Comorbidities Time since onset of injury/illness/exacerbation;Comorbidity 3+    Comorbidities CHF, HTN, hyperlipidemia, glomerulonephritis, pancytopenia/chronic anemia, seizure left-sided post stroke    Examination-Activity Limitations Locomotion Level;Transfers;Stand;Stairs    Examination-Participation Restrictions Community Activity;Yard Work    Stability/Clinical Decision Making Stable/Uncomplicated    Rehab Potential Good    PT Frequency 2x / week   plus eval   PT Duration 8 weeks    PT Treatment/Interventions ADLs/Self Care Home Management;DME Instruction;Gait training;Stair training;Functional mobility training;Therapeutic activities;Therapeutic exercise;Balance training;Neuromuscular re-education;Manual techniques;Passive range of motion;Orthotic Fit/Training;Patient/family education;Vestibular    PT Next Visit Plan Monitor BP. Any update on AFO from [facility]? Continue stretching at left ankle, hamstring and hip flexors. Dynamic gait and balance activities without AFO. Rockerboard for balance and more ankle motion. Continue gait on treadmill increasing activity tolerance.    Consulted and Agree with Plan of Care Patient;Family member/caregiver    Family Member Consulted Wife, BHassan Rowan            Patient will benefit from skilled therapeutic intervention in order to improve the following deficits and impairments:  Abnormal gait, Cardiopulmonary status limiting activity, Decreased balance, Decreased activity tolerance, Decreased mobility, Decreased strength, Impaired  sensation, Decreased range of motion, Impaired flexibility  Visit Diagnosis: Other abnormalities of gait and mobility  Muscle weakness (generalized)  Unsteadiness on feet     Problem List Patient Active Problem List   Diagnosis Date Noted   Essential hypertension 05/25/2019   Spasm 03/23/2019   Acute blood loss anemia    Steroid-induced hyperglycemia    Acute on chronic anemia    Seizures (HCC)    Spastic hemiparesis (HCC)    ANCA-associated vasculitis (HCC)    Hypertension    Thrombocytopenia (HCC)    Acute unilateral cerebral infarction in a watershed distribution ([place] 01/15/2019   Weakness    CVA (cerebral vascular accident) ([place] 01/12/2019   Malnutrition of moderate degree 07/31/2018   Elevated serum protein level    Multiple myeloma (HCC)    Pancytopenia (HCC)    Normocytic normochromic anemia 07/16/2018   Acute kidney failure ([place] 07/16/2018   NIC[place] (nonischemic cardiomyopathy) [place] 11/12/2013   History of ETOH abuse 11/12/2013   Chronic combined systolic and diastolic CHF (congestive heart failure) ([place] 11/12/2013   Congestive dilated cardiomyopathy ([place] 11/04/2013   Malignant hypertension     EElecta Sniff PT, DPT, NCS 01/03/2021, 4:46 PM  [facility] [address]Phone: [phone]  Fax:  [phone] Name: DLEELYND MALDONADOMRN: [id number] of Birth: 705/11/1960

## 2021-01-05 ENCOUNTER — Telehealth: Payer: Self-pay | Admitting: Cardiology

## 2021-01-05 ENCOUNTER — Other Ambulatory Visit: Payer: Self-pay

## 2021-01-05 ENCOUNTER — Ambulatory Visit: Payer: 59

## 2021-01-05 VITALS — BP 138/88

## 2021-01-05 DIAGNOSIS — I5032 Chronic diastolic (congestive) heart failure: Secondary | ICD-10-CM

## 2021-01-05 DIAGNOSIS — R2689 Other abnormalities of gait and mobility: Secondary | ICD-10-CM

## 2021-01-05 DIAGNOSIS — M6281 Muscle weakness (generalized): Secondary | ICD-10-CM

## 2021-01-05 DIAGNOSIS — Z01810 Encounter for preprocedural cardiovascular examination: Secondary | ICD-10-CM

## 2021-01-05 NOTE — Therapy (Signed)
[facility] [address] [place], [place], [address] Phone: [phone]   Fax:  [phone]  Physical Therapy Treatment  Patient Details  Name: Jeremy Grant MRN: [id number] Date of Birth: 03-14-1961 Referring Provider (PT): Frann Rider   Encounter Date: 01/05/2021   PT End of Session - 01/05/21 1150     Visit Number 5    Number of Visits 17    Date for PT Re-Evaluation 02/14/21    PT Start Time 1146    PT Stop Time 1228    PT Time Calculation (min) 42 min    Equipment Utilized During Treatment Gait belt    Activity Tolerance Patient tolerated treatment well    Behavior During Therapy [facility] for tasks assessed/performed             Past Medical History:  Diagnosis Date   Arthritis    CHF (congestive heart failure) ([place])    Combined systolic and diastolic cardiac dysfunction    Echo 11/03/2013 EF 77%, grade 3 diastolic dysfunction   Hypertension    NICM (nonischemic cardiomyopathy) ([place])    a. L/RHC (11/05/13): RA: 3, RV 52/5, PA 49/19 (31), PCWP 10, AO 166/93, PA 67%, Fick CO/CI: 5.71/2.97, Lmain: normal, LAD: large, without signficant dz, first diagonal has 20% dz at ostium, LCx: normal, RCA: 30% stenosis at the bifurcation of PDA and PLOM   Stroke [facility])     Past Surgical History:  Procedure Laterality Date   BIOPSY  01/31/2019   Procedure: BIOPSY;  Surgeon: Otis Brace, MD;  Location: [place] ENDOSCOPY;  Service: Gastroenterology;;   ESOPHAGOGASTRODUODENOSCOPY N/A 01/31/2019   Procedure: ESOPHAGOGASTRODUODENOSCOPY (EGD);  Surgeon: Otis Brace, MD;  Location: [facility] ENDOSCOPY;  Service: Gastroenterology;  Laterality: N/A;   IR FLUORO GUIDE CV LINE RIGHT  07/25/2018   IR US GUIDE VASC ACCESS RIGHT  07/25/2018   LEFT AND RIGHT HEART CATHETERIZATION WITH CORONARY ANGIOGRAM N/A 11/05/2013   Procedure: LEFT AND RIGHT HEART CATHETERIZATION WITH CORONARY ANGIOGRAM;  Surgeon: Peter M Martinique, MD;  Location: [facility]  CATH LAB;  Service: Cardiovascular;  Laterality: N/A;   RIGHT HEART CATHETERIZATION N/A 01/14/2014   Procedure: RIGHT HEART CATH;  Surgeon: Larey Dresser, MD;  Location: [facility] CATH LAB;  Service: Cardiovascular;  Laterality: N/A;    Vitals:   01/05/21 1150  BP: 138/88     Subjective Assessment - 01/05/21 1150     Subjective Pt denies any changes. Did have some muscle soreness after last session.    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 CHF, HTN, hyperlipidemia, glomerulonephritis, pancytopenia/chronic anemia, seizure left-sided post stroke    Currently in Pain? No/denies    Pain Onset More than a month ago                               [facility] Adult PT Treatment/Exercise - 01/05/21 1151       Ambulation/Gait   Ambulation/Gait Yes    Ambulation/Gait Assistance 5: Supervision    Ambulation/Gait Assistance Details gait on treadmill without AFO    Assistive device None    Gait Pattern Step-through pattern;Decreased step length - right    Ambulation Surface Level;Indoor    Gait Comments Treadmill 6 min x 2 at 1.2pmh with standing rest break in between.  Pt was given verbal cues to stand tall and increase step length. BP=150/88 after  Neuro Re-ed    Neuro Re-ed Details  Standing on bottom step lowering heels over edge and raising back up x 10. Step-ups on LLE without UE support with verbal and tactile cues to shift fully over left and control movements CGA. Backwards walking 40' x 2 with verbal cues to stand up tall and upright and increase step length.                      PT Short Term Goals - 01/03/21 1641       PT SHORT TERM GOAL #1   Title Pt will be independent with HEP for flexibility, strength and balance to continue gains on own.    Time 4    Period Weeks    Status New    Target Date 01/17/21      PT SHORT TERM GOAL #2   Title Pt will ambulate  500' on level indoor surfaces without AFO for improved gait and strength independently.    Time 4    Period Weeks    Status New    Target Date 01/17/21      PT SHORT TERM GOAL #3   Title Pt will be assessed for new AFO by orthotist as current one is worn down.    Time 4    Period Weeks    Status New    Target Date 01/17/21      PT SHORT TERM GOAL #4   Title FGA will be assessed and LTG written.    Baseline FGA with left AFO assessed on 12/28/20 and LTG written    Time 4    Period Weeks    Status Achieved    Target Date 01/17/21               PT Long Term Goals - 01/03/21 1641       PT LONG TERM GOAL #1   Title Pt will ambulate >1000' without AD  on varied surfaces mod I for improved community mobility and activity tolerance.    Time 8    Period Weeks    Status New      PT LONG TERM GOAL #2   Title Pt will increase FGA from 13 to >19/30 for improved gait safety and balance.    Baseline 12/28/20 13/30    Time 8    Period Weeks    Status New      PT LONG TERM GOAL #3   Title Pt will increase left ankle DF PROM to >5 degrees for improved mobility.    Baseline 12/20/20 0 degrees    Time 8    Period Weeks    Status New      PT LONG TERM GOAL #4   Title Pt will be able to complete 5 x sit to stand for improved balance and functional strength without hands.    Baseline 01/03/21 3 reps completed.    Time 8    Period Weeks    Status New      PT LONG TERM GOAL #5   Title Pt will increase gait speed from 0.65 m/s to >0.8 m/s for improved gait safety.    Baseline 12/20/20 0.43m/s without cane, with AFO    Time 8    Period Weeks    Status New                   Plan - 01/05/21 1911     Clinical Impression Statement Pt was able  to increase time on treaadmill today. Good BP response. Continued to focus more on RLE strengthening and dynamic gait activities.    Personal Factors and Comorbidities Time since onset of injury/illness/exacerbation;Comorbidity 3+     Comorbidities CHF, HTN, hyperlipidemia, glomerulonephritis, pancytopenia/chronic anemia, seizure left-sided post stroke    Examination-Activity Limitations Locomotion Level;Transfers;Stand;Stairs    Examination-Participation Restrictions Community Activity;Yard Work    Stability/Clinical Decision Making Stable/Uncomplicated    Rehab Potential Good    PT Frequency 2x / week   plus eval   PT Duration 8 weeks    PT Treatment/Interventions ADLs/Self Care Home Management;DME Instruction;Gait training;Stair training;Functional mobility training;Therapeutic activities;Therapeutic exercise;Balance training;Neuromuscular re-education;Manual techniques;Passive range of motion;Orthotic Fit/Training;Patient/family education;Vestibular    PT Next Visit Plan Monitor BP. Any update on AFO from [facility]? Continue stretching at left ankle, hamstring and hip flexors. Dynamic gait and balance activities without AFO.Backwards walking. Rockerboard for balance and more ankle motion. Continue gait on treadmill increasing activity tolerance.    Consulted and Agree with Plan of Care Patient;Family member/caregiver    Family Member Consulted Wife, [name]             Patient will benefit from skilled therapeutic intervention in order to improve the following deficits and impairments:  Abnormal gait, Cardiopulmonary status limiting activity, Decreased balance, Decreased activity tolerance, Decreased mobility, Decreased strength, Impaired sensation, Decreased range of motion, Impaired flexibility  Visit Diagnosis: Other abnormalities of gait and mobility  Muscle weakness (generalized)     Problem List Patient Active Problem List   Diagnosis Date Noted   Essential hypertension 05/25/2019   Spasm 03/23/2019   Acute blood loss anemia    Steroid-induced hyperglycemia    Acute on chronic anemia    Seizures (HCC)    Spastic hemiparesis (HCC)    ANCA-associated vasculitis (HCC)    Hypertension    Thrombocytopenia  (HCC)    Acute unilateral cerebral infarction in a watershed distribution ([place]) 01/15/2019   Weakness    CVA (cerebral vascular accident) ([place]) 01/12/2019   Malnutrition of moderate degree 07/31/2018   Elevated serum protein level    Multiple myeloma (HCC)    Pancytopenia (HCC)    Normocytic normochromic anemia 07/16/2018   Acute kidney failure ([place]) 07/16/2018   NICM (nonischemic cardiomyopathy) ([place]) 11/12/2013   History of ETOH abuse 11/12/2013   Chronic combined systolic and diastolic CHF (congestive heart failure) ([place]) 11/12/2013   Congestive dilated cardiomyopathy ([place]) 11/04/2013   Malignant hypertension     Electa Sniff, PT, DPT, NCS 01/05/2021, 7:13 PM  [facility] [address] [place], [place], [address] Phone: [phone]   Fax:  [phone]  Name: Jeremy Grant MRN: [id number] Date of Birth: 18-Dec-1960

## 2021-01-05 NOTE — Telephone Encounter (Signed)
[name] is calling stating Jeremy Grant is needing an echocardiogram performed prior to his kidney transplant. She is requesting an order be placed so they can set this up to be performed in October. She also requested PA be submitted prior to being called to schedule to confirm it will be covered. Please advise.

## 2021-01-05 NOTE — Telephone Encounter (Signed)
Message routed to MD to advise if OK to order echo as requested

## 2021-01-06 NOTE — Telephone Encounter (Signed)
Minus Breeding, MD  Fidel Levy, RN Caller: Unspecified (Yesterday,  3:27 PM) OK to order

## 2021-01-06 NOTE — Telephone Encounter (Signed)
Echo ordered Patient notified via MyChart message Staff message sent to scheduling team to arrange test appt

## 2021-01-10 ENCOUNTER — Ambulatory Visit: Payer: 59

## 2021-01-12 ENCOUNTER — Other Ambulatory Visit: Payer: Self-pay

## 2021-01-12 ENCOUNTER — Ambulatory Visit: Payer: 59

## 2021-01-12 VITALS — BP 140/80

## 2021-01-12 DIAGNOSIS — G8110 Spastic hemiplegia affecting unspecified side: Secondary | ICD-10-CM | POA: Diagnosis present

## 2021-01-12 DIAGNOSIS — R2689 Other abnormalities of gait and mobility: Secondary | ICD-10-CM | POA: Insufficient documentation

## 2021-01-12 DIAGNOSIS — M6281 Muscle weakness (generalized): Secondary | ICD-10-CM

## 2021-01-12 DIAGNOSIS — I69354 Hemiplegia and hemiparesis following cerebral infarction affecting left non-dominant side: Secondary | ICD-10-CM | POA: Insufficient documentation

## 2021-01-12 NOTE — Therapy (Signed)
[facility] [address] [place], [place], [address] Phone: [phone]   Fax:  [phone]  Physical Therapy Treatment  Patient Details  Name: Jeremy Grant MRN: [id number] Date of Birth: [birth year] Referring Provider (PT): Frann Rider   Encounter Date: 01/12/2021   PT End of Session - 01/12/21 1015     Visit Number 6    Number of Visits 17    Date for PT Re-Evaluation 02/14/21    PT Start Time 4585    PT Stop Time 1058    PT Time Calculation (min) 44 min    Equipment Utilized During Treatment Gait belt    Activity Tolerance Patient tolerated treatment well    Behavior During Therapy [facility] for tasks assessed/performed             Past Medical History:  Diagnosis Date   Arthritis    CHF (congestive heart failure) ([place])    Combined systolic and diastolic cardiac dysfunction    Echo 11/03/2013 EF 92%, grade 3 diastolic dysfunction   Hypertension    NICM (nonischemic cardiomyopathy) ([place])    a. L/RHC (11/05/13): RA: 3, RV 52/5, PA 49/19 (31), PCWP 10, AO 166/93, PA 67%, Fick CO/CI: 5.71/2.97, Lmain: normal, LAD: large, without signficant dz, first diagonal has 20% dz at ostium, LCx: normal, RCA: 30% stenosis at the bifurcation of PDA and PLOM   Stroke [facility])     Past Surgical History:  Procedure Laterality Date   BIOPSY  01/31/2019   Procedure: BIOPSY;  Surgeon: Otis Brace, MD;  Location: [place] [facility];  Service: Gastroenterology;;   ESOPHAGOGASTRODUODENOSCOPY N/A 01/31/2019   Procedure: ESOPHAGOGASTRODUODENOSCOPY (EGD);  Surgeon: Otis Brace, MD;  Location: [facility] ENDOSCOPY;  Service: Gastroenterology;  Laterality: N/A;   IR FLUORO GUIDE CV LINE RIGHT  07/25/2018   IR US GUIDE VASC ACCESS RIGHT  07/25/2018   LEFT AND RIGHT HEART CATHETERIZATION WITH CORONARY ANGIOGRAM N/A 11/05/2013   Procedure: LEFT AND RIGHT HEART CATHETERIZATION WITH CORONARY ANGIOGRAM;  Surgeon: Peter M Martinique, MD;  Location: [facility] CATH  LAB;  Service: Cardiovascular;  Laterality: N/A;   RIGHT HEART CATHETERIZATION N/A 01/14/2014   Procedure: RIGHT HEART CATH;  Surgeon: Larey Dresser, MD;  Location: [facility] CATH LAB;  Service: Cardiovascular;  Laterality: N/A;    Vitals:   01/12/21 1016  BP: 140/80     Subjective Assessment - 01/12/21 1016     Subjective Pt reports he is doing well. His left leg was a little sore due to mowing for 2 hours on rider. Pt reports that his wife did check at [facility] and needs to call them back. Pt wil have to do after the beach. Pt has echo in October.    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 CHF, HTN, hyperlipidemia, glomerulonephritis, pancytopenia/chronic anemia, seizure left-sided post stroke    Currently in Pain? Yes    Pain Score --   next to nothing   Pain Location Leg    Pain Orientation Left    Pain Descriptors / Indicators Sore    Pain Onset More than a month ago    Pain Frequency Intermittent                [facility] PT Assessment - 01/12/21 1025       ROM / Strength   AROM / PROM / Strength AROM;PROM;Strength      AROM   Left Ankle Dorsiflexion -11  PROM   Left Ankle Dorsiflexion -6                           [facility] Adult PT Treatment/Exercise - 01/12/21 1025       Ambulation/Gait   Ambulation/Gait Yes    Ambulation/Gait Assistance 6: Modified independent (Device/Increase time);5: Supervision    Ambulation/Gait Assistance Details Pt ambulated inside 575' and outside >800 without AFO donned. Verbal cues to try to increase left stance time with increasing right step at times but no LOB noted.    Ambulation Distance (Feet) 1400 Feet    Assistive device None    Gait Pattern Step-through pattern;Decreased stance time - left;Decreased step length - right    Ambulation Surface Level;Unlevel;Indoor;Outdoor;Paved;Grass      Neuro Re-ed    Neuro Re-ed Details   Step-ups on 6" with LLE x 10 with fingertip support, standing on edge of step lowering down over edge and then raising up on toes with PT helping to facilitate left weight shift. Standing with LLE on ground and right foot on 2nd step 1 min x 2 without UE support with verbal cues to hold head up and stand tall. Pt reported left calf tired after.                    PT Education - 01/12/21 1254     Education Details Discussed results of testing    Person(s) Educated Patient    Methods Explanation    Comprehension Verbalized understanding              PT Short Term Goals - 01/12/21 1021       PT SHORT TERM GOAL #1   Title Pt will be independent with HEP for flexibility, strength and balance to continue gains on own.    Baseline 01/12/21 Pt has been working on current HEP most days unless he does a lot outside.    Time 4    Period Weeks    Status Achieved    Target Date 01/17/21      PT SHORT TERM GOAL #2   Title Pt will ambulate 500' on level indoor surfaces without AFO for improved gait and strength independently.    Baseline 01/12/21 Pt is able to ambulate >1300' without AD independently on level surfaces.    Time 4    Period Weeks    Status Achieved    Target Date 01/17/21      PT SHORT TERM GOAL #3   Title Pt will be assessed for new AFO by orthotist as current one is worn down.    Baseline Pt is stay waiting on visit with [facility].    Time 4    Period Weeks    Status On-going    Target Date 01/17/21      PT SHORT TERM GOAL #4   Title FGA will be assessed and LTG written.    Baseline FGA with left AFO assessed on 12/28/20 and LTG written    Time 4    Period Weeks    Status Achieved    Target Date 01/17/21               PT Long Term Goals - 01/03/21 1641       PT LONG TERM GOAL #1   Title Pt will ambulate >1000' without AD  on varied surfaces mod I for improved community mobility and activity tolerance.    Time 8  Period Weeks    Status New       PT LONG TERM GOAL #2   Title Pt will increase FGA from 13 to >19/30 for improved gait safety and balance.    Baseline 12/28/20 13/30    Time 8    Period Weeks    Status New      PT LONG TERM GOAL #3   Title Pt will increase left ankle DF PROM to >5 degrees for improved mobility.    Baseline 12/20/20 0 degrees    Time 8    Period Weeks    Status New      PT LONG TERM GOAL #4   Title Pt will be able to complete 5 x sit to stand for improved balance and functional strength without hands.    Baseline 01/03/21 3 reps completed.    Time 8    Period Weeks    Status New      PT LONG TERM GOAL #5   Title Pt will increase gait speed from 0.65 m/s to >0.8 m/s for improved gait safety.    Baseline 12/20/20 0.20ms without cane, with AFO    Time 8    Period Weeks    Status New                   Plan - 01/12/21 1254     Clinical Impression Statement Pt met all STGs except still waiting on orthotist consult about repairing/updating AFO. Order has been sent a couple weeks ago. Pt able to increase gait distance on varied surfaces without AFO today. Does need cuing to increase left stance time more without it. Pt continues to benefit from skilled PT to further progress towards remaining goals.    Personal Factors and Comorbidities Time since onset of injury/illness/exacerbation;Comorbidity 3+    Comorbidities CHF, HTN, hyperlipidemia, glomerulonephritis, pancytopenia/chronic anemia, seizure left-sided post stroke    Examination-Activity Limitations Locomotion Level;Transfers;Stand;Stairs    Examination-Participation Restrictions Community Activity;Yard Work    Stability/Clinical Decision Making Stable/Uncomplicated    Rehab Potential Good    PT Frequency 2x / week   plus eval   PT Duration 8 weeks    PT Treatment/Interventions ADLs/Self Care Home Management;DME Instruction;Gait training;Stair training;Functional mobility training;Therapeutic activities;Therapeutic exercise;Balance  training;Neuromuscular re-education;Manual techniques;Passive range of motion;Orthotic Fit/Training;Patient/family education;Vestibular    PT Next Visit Plan Monitor BP. How was beach trip? Any update on AFO from [facility]? Continue stretching at left ankle, hamstring and hip flexors. Dynamic gait and balance activities without AFO.Backwards walking. Rockerboard for balance and more ankle motion. Continue gait on treadmill increasing activity tolerance.    Consulted and Agree with Plan of Care Patient;Family member/caregiver    Family Member Consulted Wife, BHassan Rowan            Patient will benefit from skilled therapeutic intervention in order to improve the following deficits and impairments:  Abnormal gait, Cardiopulmonary status limiting activity, Decreased balance, Decreased activity tolerance, Decreased mobility, Decreased strength, Impaired sensation, Decreased range of motion, Impaired flexibility  Visit Diagnosis: Other abnormalities of gait and mobility  Muscle weakness (generalized)     Problem List Patient Active Problem List   Diagnosis Date Noted   Essential hypertension 05/25/2019   Spasm 03/23/2019   Acute blood loss anemia    Steroid-induced hyperglycemia    Acute on chronic anemia    Seizures (HCC)    Spastic hemiparesis (HCC)    ANCA-associated vasculitis (HCC)    Hypertension    Thrombocytopenia ([place]  Acute unilateral cerebral infarction in a watershed distribution [facility]) 01/15/2019   Weakness    CVA (cerebral vascular accident) ([place]) 01/12/2019   Malnutrition of moderate degree 07/31/2018   Elevated serum protein level    Multiple myeloma (HCC)    Pancytopenia (HCC)    Normocytic normochromic anemia 07/16/2018   Acute kidney failure ([place]) 07/16/2018   NICM (nonischemic cardiomyopathy) ([place]) 11/12/2013   History of ETOH abuse 11/12/2013   Chronic combined systolic and diastolic CHF (congestive heart failure) ([place]) 11/12/2013   Congestive dilated  cardiomyopathy ([place]) 11/04/2013   Malignant hypertension     Electa Sniff, PT, DPT, NCS 01/12/2021, 12:56 PM  [facility] [address] [place], [place], [address] Phone: [phone]   Fax:  [phone]  Name: Jeremy Grant MRN: [id number] Date of Birth: [birth year]

## 2021-01-24 ENCOUNTER — Encounter: Payer: Self-pay | Admitting: Physical Medicine & Rehabilitation

## 2021-01-24 ENCOUNTER — Encounter: Payer: 59 | Attending: Physical Medicine & Rehabilitation | Admitting: Physical Medicine & Rehabilitation

## 2021-01-24 ENCOUNTER — Ambulatory Visit: Payer: 59

## 2021-01-24 ENCOUNTER — Other Ambulatory Visit: Payer: Self-pay

## 2021-01-24 VITALS — BP 167/96 | HR 72 | Temp 98.5°F | Ht 73.0 in | Wt 167.5 lb

## 2021-01-24 VITALS — BP 158/86

## 2021-01-24 DIAGNOSIS — R2689 Other abnormalities of gait and mobility: Secondary | ICD-10-CM

## 2021-01-24 DIAGNOSIS — M6281 Muscle weakness (generalized): Secondary | ICD-10-CM

## 2021-01-24 DIAGNOSIS — G8110 Spastic hemiplegia affecting unspecified side: Secondary | ICD-10-CM

## 2021-01-24 NOTE — Therapy (Signed)
[facility] [address] [place], [place], [address] Phone: [phone]   Fax:  [phone]  Physical Therapy Treatment  Patient Details  Name: Jeremy Grant MRN: [id number] Date of Birth: 1960-12-23 Referring Provider (PT): Frann Rider   Encounter Date: 01/24/2021   PT End of Session - 01/24/21 1108     Visit Number 7    Number of Visits 17    Date for PT Re-Evaluation 02/14/21    PT Start Time 1105    PT Stop Time 1145    PT Time Calculation (min) 40 min    Equipment Utilized During Treatment Gait belt    Activity Tolerance Patient tolerated treatment well    Behavior During Therapy [facility] for tasks assessed/performed             Past Medical History:  Diagnosis Date   Arthritis    CHF (congestive heart failure) (HCC)    Combined systolic and diastolic cardiac dysfunction    Echo 11/03/2013 EF 47%, grade 3 diastolic dysfunction   Hypertension    NICM (nonischemic cardiomyopathy) ([place])    a. L/RHC (11/05/13): RA: 3, RV 52/5, PA 49/19 (31), PCWP 10, AO 166/93, PA 67%, Fick CO/CI: 5.71/2.97, Lmain: normal, LAD: large, without signficant dz, first diagonal has 20% dz at ostium, LCx: normal, RCA: 30% stenosis at the bifurcation of PDA and PLOM   Stroke [facility])     Past Surgical History:  Procedure Laterality Date   BIOPSY  01/31/2019   Procedure: BIOPSY;  Surgeon: Otis Brace, MD;  Location: [place] ENDOSCOPY;  Service: Gastroenterology;;   ESOPHAGOGASTRODUODENOSCOPY N/A 01/31/2019   Procedure: ESOPHAGOGASTRODUODENOSCOPY (EGD);  Surgeon: Otis Brace, MD;  Location[facility] ENDOSCOPY;  Service: Gastroenterology;  Laterality: N/A;   IR FLUORO GUIDE CV LINE RIGHT  07/25/2018   IR US GUIDE VASC ACCESS RIGHT  07/25/2018   LEFT AND RIGHT HEART CATHETERIZATION WITH CORONARY ANGIOGRAM N/A 11/05/2013   Procedure: LEFT AND RIGHT HEART CATHETERIZATION WITH CORONARY ANGIOGRAM;  Surgeon: Peter M Martinique, MD;  Location: [facility]  CATH LAB;  Service: Cardiovascular;  Laterality: N/A;   RIGHT HEART CATHETERIZATION N/A 01/14/2014   Procedure: RIGHT HEART CATH;  Surgeon: Larey Dresser, MD;  Location[facility] CATH LAB;  Service: Cardiovascular;  Laterality: N/A;    Vitals:   01/24/21 1113  BP: (!) 158/86     Subjective Assessment - 01/24/21 1108     Subjective Pt reports that beach trip went well. Was most challenged by the 2 flights of stairs with going down. He did okay walking on beach when had shoes on but a bit challenging bare foot. Did not wear AFO.    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 CHF, HTN, hyperlipidemia, glomerulonephritis, pancytopenia/chronic anemia, seizure left-sided post stroke    Currently in Pain? No/denies    Pain Onset More than a month ago                               [facility] Adult PT Treatment/Exercise - 01/24/21 1110       Ambulation/Gait   Ambulation/Gait Yes    Ambulation/Gait Assistance 6: Modified independent (Device/Increase time);5: Supervision    Assistive device None    Gait Pattern Step-through pattern;Decreased stance time - left    Ambulation Surface Level;Indoor    Gait Comments Treadmill 7 min x 2 at 1.7mph with brief standing break  in between. Pt was given verbal cues to increase left toe off and take gentle, equal steps. BP=138/82      Neuro Re-ed    Neuro Re-ed Details  On trampoline: raising up on toes x 10, marching in place x 10 with light UE support with tactile cues at pelvis to shift over LLE more and increase stance time, tapping step with RLE to increase left SLS x 10 again with tacile cues at pelvis, attempting bilateral hops x 8 with UE support. Pt had difficulty with pushing off with LLE                       PT Short Term Goals - 01/12/21 1021       PT SHORT TERM GOAL #1   Title Pt will be independent with HEP for flexibility, strength  and balance to continue gains on own.    Baseline 01/12/21 Pt has been working on current HEP most days unless he does a lot outside.    Time 4    Period Weeks    Status Achieved    Target Date 01/17/21      PT SHORT TERM GOAL #2   Title Pt will ambulate 500' on level indoor surfaces without AFO for improved gait and strength independently.    Baseline 01/12/21 Pt is able to ambulate >1300' without AD independently on level surfaces.    Time 4    Period Weeks    Status Achieved    Target Date 01/17/21      PT SHORT TERM GOAL #3   Title Pt will be assessed for new AFO by orthotist as current one is worn down.    Baseline Pt is stay waiting on visit with [facility].    Time 4    Period Weeks    Status On-going    Target Date 01/17/21      PT SHORT TERM GOAL #4   Title FGA will be assessed and LTG written.    Baseline FGA with left AFO assessed on 12/28/20 and LTG written    Time 4    Period Weeks    Status Achieved    Target Date 01/17/21               PT Long Term Goals - 01/03/21 1641       PT LONG TERM GOAL #1   Title Pt will ambulate >1000' without AD  on varied surfaces mod I for improved community mobility and activity tolerance.    Time 8    Period Weeks    Status New      PT LONG TERM GOAL #2   Title Pt will increase FGA from 13 to >19/30 for improved gait safety and balance.    Baseline 12/28/20 13/30    Time 8    Period Weeks    Status New      PT LONG TERM GOAL #3   Title Pt will increase left ankle DF PROM to >5 degrees for improved mobility.    Baseline 12/20/20 0 degrees    Time 8    Period Weeks    Status New      PT LONG TERM GOAL #4   Title Pt will be able to complete 5 x sit to stand for improved balance and functional strength without hands.    Baseline 01/03/21 3 reps completed.    Time 8    Period Weeks    Status New  PT LONG TERM GOAL #5   Title Pt will increase gait speed from 0.65 m/s to >0.8 m/s for improved gait safety.    Baseline  12/20/20 0.39m/s without cane, with AFO    Time 8    Period Weeks    Status New                   Plan - 01/24/21 1346     Clinical Impression Statement Pt able to increase time on treadmill today with better step length throughout. He does have less toe off on left. Pt was very challenged with SLS activities on treadmill and unable to push off with LLE to hop today.    Personal Factors and Comorbidities Time since onset of injury/illness/exacerbation;Comorbidity 3+    Comorbidities CHF, HTN, hyperlipidemia, glomerulonephritis, pancytopenia/chronic anemia, seizure left-sided post stroke    Examination-Activity Limitations Locomotion Level;Transfers;Stand;Stairs    Examination-Participation Restrictions Community Activity;Yard Work    Stability/Clinical Decision Making Stable/Uncomplicated    Rehab Potential Good    PT Frequency 2x / week   plus eval   PT Duration 8 weeks    PT Treatment/Interventions ADLs/Self Care Home Management;DME Instruction;Gait training;Stair training;Functional mobility training;Therapeutic activities;Therapeutic exercise;Balance training;Neuromuscular re-education;Manual techniques;Passive range of motion;Orthotic Fit/Training;Patient/family education;Vestibular    PT Next Visit Plan Monitor BP.  Any update on AFO from [facility]? Continue stretching at left ankle, hamstring and hip flexors. Dynamic gait and balance activities without AFO.Backwards walking. Rockerboard/trampoline for balance and more ankle motion. SLS activities. Continue gait on treadmill increasing activity tolerance.    Consulted and Agree with Plan of Care Patient;Family member/caregiver    Family Member Consulted Wife, [name]             Patient will benefit from skilled therapeutic intervention in order to improve the following deficits and impairments:  Abnormal gait, Cardiopulmonary status limiting activity, Decreased balance, Decreased activity tolerance, Decreased mobility,  Decreased strength, Impaired sensation, Decreased range of motion, Impaired flexibility  Visit Diagnosis: Other abnormalities of gait and mobility  Muscle weakness (generalized)     Problem List Patient Active Problem List   Diagnosis Date Noted   Essential hypertension 05/25/2019   Spasm 03/23/2019   Acute blood loss anemia    Steroid-induced hyperglycemia    Acute on chronic anemia    Seizures (HCC)    Spastic hemiparesis (HCC)    ANCA-associated vasculitis (HCC)    Hypertension    Thrombocytopenia (HCC)    Acute unilateral cerebral infarction in a watershed distribution ([place]) 01/15/2019   Weakness    CVA (cerebral vascular accident) ([place]) 01/12/2019   Malnutrition of moderate degree 07/31/2018   Elevated serum protein level    Multiple myeloma (HCC)    Pancytopenia (HCC)    Normocytic normochromic anemia 07/16/2018   Acute kidney failure ([place]) 07/16/2018   NICM (nonischemic cardiomyopathy) ([place]) 11/12/2013   History of ETOH abuse 11/12/2013   Chronic combined systolic and diastolic CHF (congestive heart failure) ([place]) 11/12/2013   Congestive dilated cardiomyopathy ([place]) 11/04/2013   Malignant hypertension     Electa Sniff, PT, DPT, NCS 01/24/2021, 1:48 PM  [place] [address] [place], [place], [address] Phone: [phone]   Fax:  [phone]  Name: LYNDAL REGGIO MRN: [id number] Date of Birth: 1961-04-14

## 2021-01-24 NOTE — Patient Instructions (Signed)
Please increase tizanidine

## 2021-01-24 NOTE — Progress Notes (Signed)
Subjective:    Patient ID: Jeremy Grant, male    DOB: 1960/10/23, 59 y.o.   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  Prior patient of Dr. Posey Pronto.  Patient has chronic left lower extremity weakness as well as spasms.  He states that over the last couple months the spasms have gotten worse.  Spasms pull his left knee into a flexed position.  He does not note the spasms during ambulation.  He has had no recent falls. Wife is also concerned about left ankle stiffness Currently receiving OP PT LLE stiff after mowing grass Tizanidine 4 mg qhs   CLINICAL DATA:  Left leg muscle weakness.   EXAM: MRI HEAD WITHOUT CONTRAST   TECHNIQUE: Multiplanar, multiecho pulse sequences of the brain and surrounding structures were obtained without intravenous contrast.   COMPARISON:  None.   FINDINGS: Brain: Acute infarct in the deep white matter on the right involving the corpus callosum and frontal parietal deep white matter. Numerous small patchy areas of acute infarct on the right   Facilitated diffusion in the left frontal white matter may be due to subacute infarct. Chronic microvascular ischemic changes in the white matter. Small chronic infarct left frontal cortex.   Negative for mass or hydrocephalus. Mild atrophy. Chronic microhemorrhage right lateral cerebellum. No midline shift.   Vascular: Normal arterial flow voids   Skull and upper cervical spine: Negative   Sinuses/Orbits: Mild mucosal edema paranasal sinuses.  Normal orbit   Other: None   IMPRESSION: Patchy areas of acute infarct in the deep white matter on the right including the corpus callosum. Possible watershed type infarct.   Chronic microvascular ischemic changes in the white matter.     Electronically Signed   By: Franchot Gallo M.D.   On: 01/12/2019 10:43 Pain Inventory Average  Pain 2 Pain Right Now 2 My pain is aching  LOCATION OF PAIN  ankle, toes  BOWEL Number of stools per week: 7 Oral laxative use No  Type of laxative . Enema or suppository use No  History of colostomy No  Incontinent No   BLADDER Normal In and out cath, frequency . Able to self cath  na Bladder incontinence No  Frequent urination No  Leakage with coughing No  Difficulty starting stream No  Incomplete bladder emptying No    Mobility use a cane how many minutes can you walk? 10 ability to climb steps?  yes do you drive?  yes  Function not employed: date last employed 07/17/19  Neuro/Psych weakness numbness tremor anxiety  Prior Studies Any changes since last visit?  no  Physicians involved in your care Any changes since last visit?  no   Family History  Problem Relation Age of Onset   Heart attack Father 42   Heart disease Father    Arrhythmia Father    Hypertension Brother    Hypertension Brother    Social History   Socioeconomic History   Marital status: Married    Spouse name: BRENDA   Number of children: 0   Years of education: 12   Highest education level: 12th grade  Occupational History   Occupation: Medical sales representative  Tobacco Use   Smoking status: Never   Smokeless tobacco: Never  Substance and Sexual Activity   Alcohol use: Yes    Alcohol/week: 3.0 standard drinks    Types: 3 Glasses of wine  per week   Drug use: No   Sexual activity: Not Currently    Birth control/protection: None  Other Topics Concern   Not on file  Social History Narrative   Lives with wife at home with dog Abbie.     Social Determinants of Health   Financial Resource Strain: Not on file  Food Insecurity: Not on file  Transportation Needs: Not on file  Physical Activity: Not on file  Stress: Not on file  Social Connections: Not on file   Past Surgical History:  Procedure Laterality Date   BIOPSY  01/31/2019   Procedure: BIOPSY;  Surgeon: Otis Brace,  MD;  Location: [facility];  Service: Gastroenterology;;   ESOPHAGOGASTRODUODENOSCOPY N/A 01/31/2019   Procedure: ESOPHAGOGASTRODUODENOSCOPY (EGD);  Surgeon: Otis Brace, MD;  [facility] ENDOSCOPY;  Service: Gastroenterology;  Laterality: N/A;   IR FLUORO GUIDE CV LINE RIGHT  07/25/2018   IR US GUIDE VASC ACCESS RIGHT  07/25/2018   LEFT AND RIGHT HEART CATHETERIZATION WITH CORONARY ANGIOGRAM N/A 11/05/2013   Procedure: LEFT AND RIGHT HEART CATHETERIZATION WITH CORONARY ANGIOGRAM;  Surgeon: Peter M Martinique, MD;  Location: [facility] CATH LAB;  Service: Cardiovascular;  Laterality: N/A;   RIGHT HEART CATHETERIZATION N/A 01/14/2014   Procedure: RIGHT HEART CATH;  Surgeon: Larey Dresser, MD;  Location: [facility] CATH LAB;  Service: Cardiovascular;  Laterality: N/A;   Past Medical History:  Diagnosis Date   Arthritis    CHF (congestive heart failure) (HCC)    Combined systolic and diastolic cardiac dysfunction    Echo 11/03/2013 EF 66%, grade 3 diastolic dysfunction   Hypertension    NICM (nonischemic cardiomyopathy) ([place])    a. L/RHC (11/05/13): RA: 3, RV 52/5, PA 49/19 (31), PCWP 10, AO 166/93, PA 67%, Fick CO/CI: 5.71/2.97, Lmain: normal, LAD: large, without signficant dz, first diagonal has 20% dz at ostium, LCx: normal, RCA: 30% stenosis at the bifurcation of PDA and PLOM   Stroke (HCC)    BP (!) 167/96   Pulse 72   Temp 98.5 F (36.9 C) (Oral)   Ht 6\' 1"  (1.854 m)   Wt 167 lb 8 oz (76 kg)   SpO2 96%   BMI 22.10 kg/m   Opioid Risk Score:   Fall Risk Score:  `1  Depression screen PHQ 2/9  Depression screen [facility] 2/9 04/21/2020 02/17/2019  Decreased Interest 0 1  Down, Depressed, Hopeless 2 1  PHQ - 2 Score 2 2  Altered sleeping 2 2  Tired, decreased energy 0 2  Change in appetite 0 0  Feeling bad or failure about yourself  2 1  Trouble concentrating 0 0  Moving slowly or fidgety/restless 0 0  Suicidal thoughts 0 0  PHQ-9 Score 6 7  Difficult doing work/chores - Somewhat difficult  Some  recent data might be hidden     Review of Systems  Skin:  Positive for rash.  Neurological:  Positive for tremors, weakness and numbness.  Psychiatric/Behavioral:  The patient is nervous/anxious.   All other systems reviewed and are negative.     Objective:   Physical Exam Vitals and nursing note reviewed.  Constitutional:      Appearance: He is normal weight.  HENT:     Head: Normocephalic and atraumatic.  Eyes:     Extraocular Movements: Extraocular movements intact.     Conjunctiva/sclera: Conjunctivae normal.     Pupils: Pupils are equal, round, and reactive to light.  Neurological:     Mental Status: He is alert.  Cranial Nerves: No dysarthria.     Sensory: Sensory deficit present.     Coordination: Coordination abnormal. Finger-Nose-Finger Test abnormal.     Gait: Gait abnormal.     Comments: Motor strength is 5/5 in the right deltoid, bicep, tricep, grip, hip flexor, knee extensor, ankle dorsiflexor and plantar flexion Motor strength is 4/5 in the left deltoid, bicep, tricep, grip 4 - at the left hip flexor knee extensor trace ankle plantar flexor and dorsiflexor was well as toe flexors and extensors as well as foot inverters and everters Sensation reduced to light touch below the knee in the left lower extremity. Tone there is mild flexor withdrawal spasm left lower extremity.  No evidence of clonus at the ankle.  Hamstring MAS 2, Achilles MAS 4 No evidence of clonus at the knee  Psychiatric:        Attention and Perception: Attention normal.        Mood and Affect: Affect is flat.        Speech: Speech is delayed.        Behavior: Behavior is withdrawn.          Assessment & Plan:   1.  Right ACA distribution infarct with reduced initiation as well as left lower extremity weakness and increased tone.  He does not have any severe reflexive withdrawal spasms during the day but has some that wake him up at night.  Currently on tizanidine 4 mg nightly have  recommended that he double the dose to 8 mg nightly.  Wife is concerned about the effect on kidney function we discussed that this is primarily metabolized in the liver In addition we spoke of option of botulinum toxin injection the hamstring muscle if the above is not helpful. 2.  Left ankle contracture it is difficult to say if this is partially tone related however certainly does have very limited range of motion even with prolonged stretch.  We will continue with outpatient PT May need to do a diagnostic tibial nerve block with Marcaine to further assess. Discussed with patient and his wife answered questions Over half of the 35 min visit was spent counseling and coordinating care.  Discussed various orthotics which can be utilized to help increase range at the ankle, hinged orthosis with semisolid probably propylene versus double metal upright AFO with dual channels

## 2021-01-26 ENCOUNTER — Ambulatory Visit: Payer: 59

## 2021-01-26 ENCOUNTER — Other Ambulatory Visit: Payer: Self-pay

## 2021-01-26 DIAGNOSIS — M6281 Muscle weakness (generalized): Secondary | ICD-10-CM

## 2021-01-26 DIAGNOSIS — G8110 Spastic hemiplegia affecting unspecified side: Secondary | ICD-10-CM | POA: Diagnosis not present

## 2021-01-26 DIAGNOSIS — R2689 Other abnormalities of gait and mobility: Secondary | ICD-10-CM

## 2021-01-26 DIAGNOSIS — I69354 Hemiplegia and hemiparesis following cerebral infarction affecting left non-dominant side: Secondary | ICD-10-CM

## 2021-01-26 NOTE — Therapy (Signed)
[facility] [address] [place], [place], [address] Phone: [phone]   Fax:  [phone]  Physical Therapy Treatment  Patient Details  Name: Jeremy Grant MRN: [id number] Date of Birth: December 08, 1960 Referring Provider (PT): Frann Rider   Encounter Date: 01/26/2021   PT End of Session - 01/26/21 1029     Visit Number 8    Number of Visits 17    Date for PT Re-Evaluation 02/14/21    PT Start Time 5643    PT Stop Time 1058    PT Time Calculation (min) 43 min    Equipment Utilized During Treatment Gait belt    Activity Tolerance Patient tolerated treatment well    Behavior During Therapy [facility] for tasks assessed/performed             Past Medical History:  Diagnosis Date   Arthritis    CHF (congestive heart failure) ([place])    Combined systolic and diastolic cardiac dysfunction    Echo 11/03/2013 EF 32%, grade 3 diastolic dysfunction   Hypertension    NICM (nonischemic cardiomyopathy) ([place])    a. L/RHC (11/05/13): RA: 3, RV 52/5, PA 49/19 (31), PCWP 10, AO 166/93, PA 67%, Fick CO/CI: 5.71/2.97, Lmain: normal, LAD: large, without signficant dz, first diagonal has 20% dz at ostium, LCx: normal, RCA: 30% stenosis at the bifurcation of PDA and PLOM   Stroke [facility])     Past Surgical History:  Procedure Laterality Date   BIOPSY  01/31/2019   Procedure: BIOPSY;  Surgeon: Otis Brace, MD;  Location: [facility] ENDOSCOPY;  Service: Gastroenterology;;   ESOPHAGOGASTRODUODENOSCOPY N/A 01/31/2019   Procedure: ESOPHAGOGASTRODUODENOSCOPY (EGD);  Surgeon: Otis Brace, MD;  Location: [facility] ENDOSCOPY;  Service: Gastroenterology;  Laterality: N/A;   IR FLUORO GUIDE CV LINE RIGHT  07/25/2018   IR US GUIDE VASC ACCESS RIGHT  07/25/2018   LEFT AND RIGHT HEART CATHETERIZATION WITH CORONARY ANGIOGRAM N/A 11/05/2013   Procedure: LEFT AND RIGHT HEART CATHETERIZATION WITH CORONARY ANGIOGRAM;  Surgeon: Peter M Martinique, MD;  Location: [facility]  CATH LAB;  Service: Cardiovascular;  Laterality: N/A;   RIGHT HEART CATHETERIZATION N/A 01/14/2014   Procedure: RIGHT HEART CATH;  Surgeon: Larey Dresser, MD;  Location: [facility] CATH LAB;  Service: Cardiovascular;  Laterality: N/A;    There were no vitals filed for this visit.   Subjective Assessment - 01/26/21 1029     Subjective Pt reports that visit with Dr. Letta Pate went well. He gave him a script for a double metal upright versus plastic hinged AFO.    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 CHF, HTN, hyperlipidemia, glomerulonephritis, pancytopenia/chronic anemia, seizure left-sided post stroke    Pain Onset More than a month ago                [facility] PT Assessment - 01/26/21 1032       AROM   Left Ankle Dorsiflexion -12      PROM   Left Ankle Dorsiflexion -6                           [facility] Adult PT Treatment/Exercise - 01/26/21 1032       Ambulation/Gait   Ambulation/Gait Yes    Gait Comments Gait over treadmill x 7 min (2 min warm up level then 3 min at 2% incline then 2 min cool down) at 1.47mph. Verbal cues to push off  left toes more and increase step length and heel strike. Pt able to take more gentle right step today. BP=160/80      Therapeutic Activites    Therapeutic Activities Other Therapeutic Activities    Other Therapeutic Activities PT discussed with patient about braces that physiatrist had suggested on script. Understand benefit of hinged AFO but pt does not appear to need as much support that they offer with these custom options at this time. If anything the metal upright would be better than custom plastic but also adds more weight. Will also discuss with orthotist as pt still waiting to get in about AFO. No AFO utilized during session.      Neuro Re-ed    Neuro Re-ed Details  Dynamic gait in hallway: backwards gait 40 'x 4, marcing gait 40' x 2 with  CGA/min assist to facilitate left weight shift. Pt was given verbal cues to increase weight shift and stance time on LLE.                     PT Education - 01/26/21 1114     Education Details Discussed AFO options and pros/cons for various types. Will follow-up with orthotist to get their thoughts as well.    Person(s) Educated Patient    Methods Explanation    Comprehension Verbalized understanding              PT Short Term Goals - 01/12/21 1021       PT SHORT TERM GOAL #1   Title Pt will be independent with HEP for flexibility, strength and balance to continue gains on own.    Baseline 01/12/21 Pt has been working on current HEP most days unless he does a lot outside.    Time 4    Period Weeks    Status Achieved    Target Date 01/17/21      PT SHORT TERM GOAL #2   Title Pt will ambulate 500' on level indoor surfaces without AFO for improved gait and strength independently.    Baseline 01/12/21 Pt is able to ambulate >1300' without AD independently on level surfaces.    Time 4    Period Weeks    Status Achieved    Target Date 01/17/21      PT SHORT TERM GOAL #3   Title Pt will be assessed for new AFO by orthotist as current one is worn down.    Baseline Pt is stay waiting on visit with [facility].    Time 4    Period Weeks    Status On-going    Target Date 01/17/21      PT SHORT TERM GOAL #4   Title FGA will be assessed and LTG written.    Baseline FGA with left AFO assessed on 12/28/20 and LTG written    Time 4    Period Weeks    Status Achieved    Target Date 01/17/21               PT Long Term Goals - 01/03/21 1641       PT LONG TERM GOAL #1   Title Pt will ambulate >1000' without AD  on varied surfaces mod I for improved community mobility and activity tolerance.    Time 8    Period Weeks    Status New      PT LONG TERM GOAL #2   Title Pt will increase FGA from 13 to >19/30 for improved gait safety and balance.  Baseline 12/28/20 13/30     Time 8    Period Weeks    Status New      PT LONG TERM GOAL #3   Title Pt will increase left ankle DF PROM to >5 degrees for improved mobility.    Baseline 12/20/20 0 degrees    Time 8    Period Weeks    Status New      PT LONG TERM GOAL #4   Title Pt will be able to complete 5 x sit to stand for improved balance and functional strength without hands.    Baseline 01/03/21 3 reps completed.    Time 8    Period Weeks    Status New      PT LONG TERM GOAL #5   Title Pt will increase gait speed from 0.65 m/s to >0.8 m/s for improved gait safety.    Baseline 12/20/20 0.56m/s without cane, with AFO    Time 8    Period Weeks    Status New                   Plan - 01/26/21 1117     Clinical Impression Statement Pt continued to work without AFO during session. Added incline to treadmill today to try to get more hamstring activation and push off on LLE. Pt did fatigue faster with this.    Personal Factors and Comorbidities Time since onset of injury/illness/exacerbation;Comorbidity 3+    Comorbidities CHF, HTN, hyperlipidemia, glomerulonephritis, pancytopenia/chronic anemia, seizure left-sided post stroke    Examination-Activity Limitations Locomotion Level;Transfers;Stand;Stairs    Examination-Participation Restrictions Community Activity;Yard Work    Stability/Clinical Decision Making Stable/Uncomplicated    Rehab Potential Good    PT Frequency 2x / week   plus eval   PT Duration 8 weeks    PT Treatment/Interventions ADLs/Self Care Home Management;DME Instruction;Gait training;Stair training;Functional mobility training;Therapeutic activities;Therapeutic exercise;Balance training;Neuromuscular re-education;Manual techniques;Passive range of motion;Orthotic Fit/Training;Patient/family education;Vestibular    PT Next Visit Plan Monitor BP.  Any update on AFO from [facility]? Continue stretching at left ankle, hamstring and hip flexors. Dynamic gait and balance activities without  AFO.Backwards walking. Rockerboard/trampoline for balance and more ankle motion. SLS activities. Continue gait on treadmill increasing activity tolerance also with adding incline.    Consulted and Agree with Plan of Care Patient;Family member/caregiver    Family Member Consulted Wife, [name]             Patient will benefit from skilled therapeutic intervention in order to improve the following deficits and impairments:  Abnormal gait, Cardiopulmonary status limiting activity, Decreased balance, Decreased activity tolerance, Decreased mobility, Decreased strength, Impaired sensation, Decreased range of motion, Impaired flexibility  Visit Diagnosis: Other abnormalities of gait and mobility  Muscle weakness (generalized)  Hemiplegia and hemiparesis following cerebral infarction affecting left non-dominant side [facility])     Problem List Patient Active Problem List   Diagnosis Date Noted   Essential hypertension 05/25/2019   Spasm 03/23/2019   Acute blood loss anemia    Steroid-induced hyperglycemia    Acute on chronic anemia    Seizures (HCC)    Spastic hemiparesis (HCC)    ANCA-associated vasculitis (HCC)    Hypertension    Thrombocytopenia (HCC)    Acute unilateral cerebral infarction in a watershed distribution [facility]) 01/15/2019   Weakness    CVA (cerebral vascular accident) ([place]) 01/12/2019   Malnutrition of moderate degree 07/31/2018   Elevated serum protein level    Multiple myeloma (HCC)    Pancytopenia ([place])  Normocytic normochromic anemia 07/16/2018   Acute kidney failure ([place]) 07/16/2018   NICM (nonischemic cardiomyopathy) ([place]) 11/12/2013   History of ETOH abuse 11/12/2013   Chronic combined systolic and diastolic CHF (congestive heart failure) ([place]) 11/12/2013   Congestive dilated cardiomyopathy ([place]) 11/04/2013   Malignant hypertension     Electa Sniff, PT, DPT, NCS 01/26/2021, 11:19 AM  [facility] [address] [place], [place], [address] Phone: [phone]   Fax:  [phone]  Name: AMERE BRICCO MRN: [id number] Date of Birth: April 24, 1961

## 2021-01-31 ENCOUNTER — Ambulatory Visit: Payer: 59

## 2021-01-31 ENCOUNTER — Other Ambulatory Visit: Payer: Self-pay

## 2021-01-31 DIAGNOSIS — I69354 Hemiplegia and hemiparesis following cerebral infarction affecting left non-dominant side: Secondary | ICD-10-CM

## 2021-01-31 DIAGNOSIS — R2689 Other abnormalities of gait and mobility: Secondary | ICD-10-CM

## 2021-01-31 DIAGNOSIS — G8110 Spastic hemiplegia affecting unspecified side: Secondary | ICD-10-CM | POA: Diagnosis not present

## 2021-01-31 DIAGNOSIS — M6281 Muscle weakness (generalized): Secondary | ICD-10-CM

## 2021-01-31 NOTE — Therapy (Signed)
[facility] [address] [place], [place], [address] Phone: [phone]   Fax:  [phone]  Physical Therapy Treatment  Patient Details  Name: Jeremy Grant MRN: [id number] Date of Birth: Nov 27, 1960 Referring Provider (PT): Frann Rider   Encounter Date: 01/31/2021   PT End of Session - 01/31/21 1021     Visit Number 9    Number of Visits 17    Date for PT Re-Evaluation 02/14/21    PT Start Time 1020    PT Stop Time 1100    PT Time Calculation (min) 40 min    Equipment Utilized During Treatment Gait belt    Activity Tolerance Patient tolerated treatment well    Behavior During Therapy [facility] for tasks assessed/performed             Past Medical History:  Diagnosis Date   Arthritis    CHF (congestive heart failure) ([place])    Combined systolic and diastolic cardiac dysfunction    Echo 11/03/2013 EF 66%, grade 3 diastolic dysfunction   Hypertension    NICM (nonischemic cardiomyopathy) ([place])    a. L/RHC (11/05/13): RA: 3, RV 52/5, PA 49/19 (31), PCWP 10, AO 166/93, PA 67%, Fick CO/CI: 5.71/2.97, Lmain: normal, LAD: large, without signficant dz, first diagonal has 20% dz at ostium, LCx: normal, RCA: 30% stenosis at the bifurcation of PDA and PLOM   Stroke [facility])     Past Surgical History:  Procedure Laterality Date   BIOPSY  01/31/2019   Procedure: BIOPSY;  Surgeon: Otis Brace, MD;  Location: [facility] ENDOSCOPY;  Service: Gastroenterology;;   ESOPHAGOGASTRODUODENOSCOPY N/A 01/31/2019   Procedure: ESOPHAGOGASTRODUODENOSCOPY (EGD);  Surgeon: Otis Brace, MD;  Location: [facility] ENDOSCOPY;  Service: Gastroenterology;  Laterality: N/A;   IR FLUORO GUIDE CV LINE RIGHT  07/25/2018   IR US GUIDE VASC ACCESS RIGHT  07/25/2018   LEFT AND RIGHT HEART CATHETERIZATION WITH CORONARY ANGIOGRAM N/A 11/05/2013   Procedure: LEFT AND RIGHT HEART CATHETERIZATION WITH CORONARY ANGIOGRAM;  Surgeon: Peter M Martinique, MD;  Location: [facility]  CATH LAB;  Service: Cardiovascular;  Laterality: N/A;   RIGHT HEART CATHETERIZATION N/A 01/14/2014   Procedure: RIGHT HEART CATH;  Surgeon: Larey Dresser, MD;  Location: [facility] CATH LAB;  Service: Cardiovascular;  Laterality: N/A;    There were no vitals filed for this visit.   Subjective Assessment - 01/31/21 1021     Subjective Pt denies any new issues. He mowed the yard this weekend.    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 CHF, HTN, hyperlipidemia, glomerulonephritis, pancytopenia/chronic anemia, seizure left-sided post stroke    Currently in Pain? No/denies    Pain Onset More than a month ago                               [facility] Adult PT Treatment/Exercise - 01/31/21 1024       Ambulation/Gait   Ambulation/Gait Yes    Ambulation/Gait Assistance 5: Supervision    Ambulation/Gait Assistance Details Pt was given verbal cues to increase left stance time with engaging gluts and increasing right step. Pt seemed to do better on grass with increased time than pavement. Pt ambulated up/down grassy hill. BP=152/88    Ambulation Distance (Feet) 1000 Feet    Assistive device None    Gait Pattern Step-through pattern;Decreased stance time - left    Ambulation Surface Level;Unlevel;Indoor;Outdoor;Paved;Grass  Therapeutic Activites    Therapeutic Activities Other Therapeutic Activities    Other Therapeutic Activities PT called [place] [facility] to try to get pt scheduled for new velcro/padding to be placed on current AFO after PT spoke to orthotist, Gerald Stabs, last week. Spoke with front office person, Lenna Sciara, who stated that they did not have order even though PT had faxed on 8/17. PT refaxed but they would not schedule until received and would call pt.      Neuro Re-ed    Neuro Re-ed Details  Tall kneeling on mat with blue physioball in front for light UE support: maintaining upright  posture with weight shifting to the left x 10 with tactile and verbal cues to engage left hip muscles more and stay up tall; stepping forward with RLE to increase left weight shift x 4 with tactle cues to help. Pt did report some discomfort in left shin with increased pressure. Switched to quadruped: stepping forward and back x 10 each side. Standing on ramp uphill: marching x 10 with tactile and verbal cues to increase left weight shift, standing with RLE on 4" step to increase left weight shift 20 sec x 2 close SBA/CGA. Pt needed CGA to step foot back.                     PT Education - 01/31/21 1131     Education Details Pt to call [facility] to schedule if has not heard from by 2 today.    Person(s) Educated Patient    Methods Explanation    Comprehension Verbalized understanding              PT Short Term Goals - 01/12/21 1021       PT SHORT TERM GOAL #1   Title Pt will be independent with HEP for flexibility, strength and balance to continue gains on own.    Baseline 01/12/21 Pt has been working on current HEP most days unless he does a lot outside.    Time 4    Period Weeks    Status Achieved    Target Date 01/17/21      PT SHORT TERM GOAL #2   Title Pt will ambulate 500' on level indoor surfaces without AFO for improved gait and strength independently.    Baseline 01/12/21 Pt is able to ambulate >1300' without AD independently on level surfaces.    Time 4    Period Weeks    Status Achieved    Target Date 01/17/21      PT SHORT TERM GOAL #3   Title Pt will be assessed for new AFO by orthotist as current one is worn down.    Baseline Pt is stay waiting on visit with [facility].    Time 4    Period Weeks    Status On-going    Target Date 01/17/21      PT SHORT TERM GOAL #4   Title FGA will be assessed and LTG written.    Baseline FGA with left AFO assessed on 12/28/20 and LTG written    Time 4    Period Weeks    Status Achieved    Target Date 01/17/21                PT Long Term Goals - 01/03/21 1641       PT LONG TERM GOAL #1   Title Pt will ambulate >1000' without AD  on varied surfaces mod I for improved community mobility and activity  tolerance.    Time 8    Period Weeks    Status New      PT LONG TERM GOAL #2   Title Pt will increase FGA from 13 to >19/30 for improved gait safety and balance.    Baseline 12/28/20 13/30    Time 8    Period Weeks    Status New      PT LONG TERM GOAL #3   Title Pt will increase left ankle DF PROM to >5 degrees for improved mobility.    Baseline 12/20/20 0 degrees    Time 8    Period Weeks    Status New      PT LONG TERM GOAL #4   Title Pt will be able to complete 5 x sit to stand for improved balance and functional strength without hands.    Baseline 01/03/21 3 reps completed.    Time 8    Period Weeks    Status New      PT LONG TERM GOAL #5   Title Pt will increase gait speed from 0.65 m/s to >0.8 m/s for improved gait safety.    Baseline 12/20/20 0.41ms without cane, with AFO    Time 8    Period Weeks    Status New                   Plan - 01/31/21 1133     Clinical Impression Statement PT continued to work on increasing left stance time without AFO. Pt did well on nonlevel surfaces outside with no LBO.    Personal Factors and Comorbidities Time since onset of injury/illness/exacerbation;Comorbidity 3+    Comorbidities CHF, HTN, hyperlipidemia, glomerulonephritis, pancytopenia/chronic anemia, seizure left-sided post stroke    Examination-Activity Limitations Locomotion Level;Transfers;Stand;Stairs    Examination-Participation Restrictions Community Activity;Yard Work    Stability/Clinical Decision Making Stable/Uncomplicated    Rehab Potential Good    PT Frequency 2x / week   plus eval   PT Duration 8 weeks    PT Treatment/Interventions ADLs/Self Care Home Management;DME Instruction;Gait training;Stair training;Functional mobility training;Therapeutic activities;Therapeutic  exercise;Balance training;Neuromuscular re-education;Manual techniques;Passive range of motion;Orthotic Fit/Training;Patient/family education;Vestibular    PT Next Visit Plan Monitor BP.  [facility] Continue stretching at left ankle, hamstring and hip flexors. Dynamic gait and balance activities without AFO.Backwards walking. Rockerboard/trampoline for balance and more ankle motion. SLS activities. Continue gait on treadmill increasing activity tolerance also with adding incline.    Consulted and Agree with Plan of Care Patient;Family member/caregiver    Family Member Consulted Wife, BHassan Rowan            Patient will benefit from skilled therapeutic intervention in order to improve the following deficits and impairments:  Abnormal gait, Cardiopulmonary status limiting activity, Decreased balance, Decreased activity tolerance, Decreased mobility, Decreased strength, Impaired sensation, Decreased range of motion, Impaired flexibility  Visit Diagnosis: Other abnormalities of gait and mobility  Muscle weakness (generalized)  Hemiplegia and hemiparesis following cerebral infarction affecting left non-dominant side ([facility]     Problem List Patient Active Problem List   Diagnosis Date Noted   Essential hypertension 05/25/2019   Spasm 03/23/2019   Acute blood loss anemia    Steroid-induced hyperglycemia    Acute on chronic anemia    Seizures (HCC)    Spastic hemiparesis (HCC)    ANCA-associated vasculitis (HCC)    Hypertension    Thrombocytopenia (HCC)    Acute unilateral cerebral infarction in a watershed distribution ([facility] 01/15/2019  Weakness    CVA (cerebral vascular accident) ([place]) 01/12/2019   Malnutrition of moderate degree 07/31/2018   Elevated serum protein level    Multiple myeloma (HCC)    Pancytopenia (HCC)    Normocytic normochromic anemia 07/16/2018   Acute kidney failure ([place]) 07/16/2018   NICM (nonischemic cardiomyopathy) ([place]) 11/12/2013    History of ETOH abuse 11/12/2013   Chronic combined systolic and diastolic CHF (congestive heart failure) ([place]) 11/12/2013   Congestive dilated cardiomyopathy ([place]) 11/04/2013   Malignant hypertension     Electa Sniff, PT, DPT, NCS 01/31/2021, 11:34 AM  [facility] [address] [place], [place], [address] Phone: [phone]   Fax:  [phone]  Name: NATANIEL GASPER MRN: [id number] Date of Birth: Jan 25, 1961

## 2021-02-02 ENCOUNTER — Ambulatory Visit: Payer: 59

## 2021-02-02 ENCOUNTER — Other Ambulatory Visit: Payer: Self-pay

## 2021-02-02 VITALS — BP 158/86

## 2021-02-02 DIAGNOSIS — M6281 Muscle weakness (generalized): Secondary | ICD-10-CM

## 2021-02-02 DIAGNOSIS — R2689 Other abnormalities of gait and mobility: Secondary | ICD-10-CM

## 2021-02-02 DIAGNOSIS — G8110 Spastic hemiplegia affecting unspecified side: Secondary | ICD-10-CM | POA: Diagnosis not present

## 2021-02-02 NOTE — Therapy (Signed)
[facility] [address] [place], [place], [address] Phone: [phone]   Fax:  [phone]  Physical Therapy Treatment  Patient Details  Name: Jeremy Grant MRN: [id number] Date of Birth: 1961/02/22 Referring Provider (PT): Frann Rider   Encounter Date: 02/02/2021   PT End of Session - 02/02/21 1021     Visit Number 10    Number of Visits 17    Date for PT Re-Evaluation 02/14/21    PT Start Time 1018    PT Stop Time 1059    PT Time Calculation (min) 41 min    Equipment Utilized During Treatment Gait belt    Activity Tolerance Patient tolerated treatment well    Behavior During Therapy [facility] for tasks assessed/performed             Past Medical History:  Diagnosis Date   Arthritis    CHF (congestive heart failure) ([place])    Combined systolic and diastolic cardiac dysfunction    Echo 11/03/2013 EF 01%, grade 3 diastolic dysfunction   Hypertension    NICM (nonischemic cardiomyopathy) ([place])    a. L/RHC (11/05/13): RA: 3, RV 52/5, PA 49/19 (31), PCWP 10, AO 166/93, PA 67%, Fick CO/CI: 5.71/2.97, Lmain: normal, LAD: large, without signficant dz, first diagonal has 20% dz at ostium, LCx: normal, RCA: 30% stenosis at the bifurcation of PDA and PLOM   Stroke [facility])     Past Surgical History:  Procedure Laterality Date   BIOPSY  01/31/2019   Procedure: BIOPSY;  Surgeon: Otis Brace, MD;  Location: [facility] ENDOSCOPY;  Service: Gastroenterology;;   ESOPHAGOGASTRODUODENOSCOPY N/A 01/31/2019   Procedure: ESOPHAGOGASTRODUODENOSCOPY (EGD);  Surgeon: Otis Brace, MD;  Location: [facility] ENDOSCOPY;  Service: Gastroenterology;  Laterality: N/A;   IR FLUORO GUIDE CV LINE RIGHT  07/25/2018   IR US GUIDE VASC ACCESS RIGHT  07/25/2018   LEFT AND RIGHT HEART CATHETERIZATION WITH CORONARY ANGIOGRAM N/A 11/05/2013   Procedure: LEFT AND RIGHT HEART CATHETERIZATION WITH CORONARY ANGIOGRAM;  Surgeon: Peter M Martinique, MD;  Location: [facility]  CATH LAB;  Service: Cardiovascular;  Laterality: N/A;   RIGHT HEART CATHETERIZATION N/A 01/14/2014   Procedure: RIGHT HEART CATH;  Surgeon: Larey Dresser, MD;  Location: [facility] CATH LAB;  Service: Cardiovascular;  Laterality: N/A;    Vitals:   02/02/21 1025  BP: (!) 158/86     Subjective Assessment - 02/02/21 1021     Subjective Pt reports that he is going next Tuesday to orthotist to get straps fixed on current AFO.    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 CHF, HTN, hyperlipidemia, glomerulonephritis, pancytopenia/chronic anemia, seizure left-sided post stroke    Currently in Pain? No/denies    Pain Onset More than a month ago                               [facility] Adult PT Treatment/Exercise - 02/02/21 1022       Ambulation/Gait   Ambulation/Gait Yes    Ambulation/Gait Assistance 5: Supervision    Gait Comments Gait over treadmill at 1.15mh with UE  support: 2 min level, 4 min 2% incline, 2 min flat. Seated rest break then 2 min level, 2 min incline, 2 min flat. Pt was given verbal cues to increase left stance time with larger steps. Also cued to stand up tall and not lean on arms. BP=158/88 after. Gait over  obstacles- 4 low hurdles and 4 high hurdles with cane but no AFO x 4 bouts CGA/min assist occasionally. Reciprocal steps over 4 different height beams (wood and foam) x 2 without cane. pt likes to lead with LLE over.                       PT Short Term Goals - 01/12/21 1021       PT SHORT TERM GOAL #1   Title Pt will be independent with HEP for flexibility, strength and balance to continue gains on own.    Baseline 01/12/21 Pt has been working on current HEP most days unless he does a lot outside.    Time 4    Period Weeks    Status Achieved    Target Date 01/17/21      PT SHORT TERM GOAL #2   Title Pt will ambulate 500' on level indoor surfaces without AFO  for improved gait and strength independently.    Baseline 01/12/21 Pt is able to ambulate >1300' without AD independently on level surfaces.    Time 4    Period Weeks    Status Achieved    Target Date 01/17/21      PT SHORT TERM GOAL #3   Title Pt will be assessed for new AFO by orthotist as current one is worn down.    Baseline Pt is stay waiting on visit with [facility].    Time 4    Period Weeks    Status On-going    Target Date 01/17/21      PT SHORT TERM GOAL #4   Title FGA will be assessed and LTG written.    Baseline FGA with left AFO assessed on 12/28/20 and LTG written    Time 4    Period Weeks    Status Achieved    Target Date 01/17/21               PT Long Term Goals - 01/03/21 1641       PT LONG TERM GOAL #1   Title Pt will ambulate >1000' without AD  on varied surfaces mod I for improved community mobility and activity tolerance.    Time 8    Period Weeks    Status New      PT LONG TERM GOAL #2   Title Pt will increase FGA from 13 to >19/30 for improved gait safety and balance.    Baseline 12/28/20 13/30    Time 8    Period Weeks    Status New      PT LONG TERM GOAL #3   Title Pt will increase left ankle DF PROM to >5 degrees for improved mobility.    Baseline 12/20/20 0 degrees    Time 8    Period Weeks    Status New      PT LONG TERM GOAL #4   Title Pt will be able to complete 5 x sit to stand for improved balance and functional strength without hands.    Baseline 01/03/21 3 reps completed.    Time 8    Period Weeks    Status New      PT LONG TERM GOAL #5   Title Pt will increase gait speed from 0.65 m/s to >0.8 m/s for improved gait safety.    Baseline 12/20/20 0.12ms without cane, with AFO    Time 8    Period Weeks    Status New  Plan - 02/02/21 1210     Clinical Impression Statement Pt continued to work without AFO during session. Able to increase time on incline on treadmill today at faster speed. Pt does tend to  lean on arms more as fatigues. Showing improving SLS control with reciprocal stepping over obstacles.    Personal Factors and Comorbidities Time since onset of injury/illness/exacerbation;Comorbidity 3+    Comorbidities CHF, HTN, hyperlipidemia, glomerulonephritis, pancytopenia/chronic anemia, seizure left-sided post stroke    Examination-Activity Limitations Locomotion Level;Transfers;Stand;Stairs    Examination-Participation Restrictions Community Activity;Yard Work    Stability/Clinical Decision Making Stable/Uncomplicated    Rehab Potential Good    PT Frequency 2x / week   plus eval   PT Duration 8 weeks    PT Treatment/Interventions ADLs/Self Care Home Management;DME Instruction;Gait training;Stair training;Functional mobility training;Therapeutic activities;Therapeutic exercise;Balance training;Neuromuscular re-education;Manual techniques;Passive range of motion;Orthotic Fit/Training;Patient/family education;Vestibular    PT Next Visit Plan Monitor BP.  How did it go at [employer]? Continue stretching at left ankle, hamstring and hip flexors. Dynamic gait and balance activities without AFO.Backwards walking. Rockerboard/trampoline for balance and more ankle motion. SLS activities. Continue gait on treadmill increasing activity tolerance also with adding incline.    Consulted and Agree with Plan of Care Patient;Family member/caregiver    Family Member Consulted Wife, [name]             Patient will benefit from skilled therapeutic intervention in order to improve the following deficits and impairments:  Abnormal gait, Cardiopulmonary status limiting activity, Decreased balance, Decreased activity tolerance, Decreased mobility, Decreased strength, Impaired sensation, Decreased range of motion, Impaired flexibility  Visit Diagnosis: Other abnormalities of gait and mobility  Muscle weakness (generalized)     Problem List Patient Active Problem List   Diagnosis Date Noted   Essential  hypertension 05/25/2019   Spasm 03/23/2019   Acute blood loss anemia    Steroid-induced hyperglycemia    Acute on chronic anemia    Seizures (HCC)    Spastic hemiparesis (HCC)    ANCA-associated vasculitis (HCC)    Hypertension    Thrombocytopenia (HCC)    Acute unilateral cerebral infarction in a watershed distribution ([place]) 01/15/2019   Weakness    CVA (cerebral vascular accident) ([place]) 01/12/2019   Malnutrition of moderate degree 07/31/2018   Elevated serum protein level    Multiple myeloma (HCC)    Pancytopenia (HCC)    Normocytic normochromic anemia 07/16/2018   Acute kidney failure ([place]) 07/16/2018   NICM (nonischemic cardiomyopathy) ([place]) 11/12/2013   History of ETOH abuse 11/12/2013   Chronic combined systolic and diastolic CHF (congestive heart failure) ([place]) 11/12/2013   Congestive dilated cardiomyopathy ([place]) 11/04/2013   Malignant hypertension     Electa Sniff, PT, DPT, NCS 02/02/2021, 12:12 PM  [facility] [address] [place], [place], [address] Phone: [phone]   Fax:  [phone]  Name: Jeremy Grant MRN: [id number] Date of Birth: 02-25-1961

## 2021-02-07 ENCOUNTER — Ambulatory Visit: Payer: 59

## 2021-02-07 ENCOUNTER — Other Ambulatory Visit: Payer: Self-pay

## 2021-02-07 DIAGNOSIS — G8110 Spastic hemiplegia affecting unspecified side: Secondary | ICD-10-CM | POA: Diagnosis not present

## 2021-02-07 DIAGNOSIS — M6281 Muscle weakness (generalized): Secondary | ICD-10-CM

## 2021-02-07 DIAGNOSIS — R2689 Other abnormalities of gait and mobility: Secondary | ICD-10-CM

## 2021-02-07 NOTE — Therapy (Signed)
[facility] [address] [place], [place], [address] Phone: [phone]   Fax:  [phone]  Physical Therapy Treatment  Patient Details  Name: Jeremy Grant MRN: [id number] Date of Birth: 1960-06-12 Referring Provider (PT): Frann Rider   Encounter Date: 02/07/2021   PT End of Session - 02/07/21 1026     Visit Number 11    Number of Visits 17    Date for PT Re-Evaluation 02/14/21    PT Start Time 1024   pt arriving   PT Stop Time 1100    PT Time Calculation (min) 36 min    Equipment Utilized During Treatment Gait belt    Activity Tolerance Patient tolerated treatment well    Behavior During Therapy [facility] for tasks assessed/performed             Past Medical History:  Diagnosis Date   Arthritis    CHF (congestive heart failure) ([place])    Combined systolic and diastolic cardiac dysfunction    Echo 11/03/2013 EF 01%, grade 3 diastolic dysfunction   Hypertension    NICM (nonischemic cardiomyopathy) ([place])    a. L/RHC (11/05/13): RA: 3, RV 52/5, PA 49/19 (31), PCWP 10, AO 166/93, PA 67%, Fick CO/CI: 5.71/2.97, Lmain: normal, LAD: large, without signficant dz, first diagonal has 20% dz at ostium, LCx: normal, RCA: 30% stenosis at the bifurcation of PDA and PLOM   Stroke [facility])     Past Surgical History:  Procedure Laterality Date   BIOPSY  01/31/2019   Procedure: BIOPSY;  Surgeon: Otis Brace, MD;  Location: [facility] ENDOSCOPY;  Service: Gastroenterology;;   ESOPHAGOGASTRODUODENOSCOPY N/A 01/31/2019   Procedure: ESOPHAGOGASTRODUODENOSCOPY (EGD);  Surgeon: Otis Brace, MD;  Location: [facility] ENDOSCOPY;  Service: Gastroenterology;  Laterality: N/A;   IR FLUORO GUIDE CV LINE RIGHT  07/25/2018   IR US GUIDE VASC ACCESS RIGHT  07/25/2018   LEFT AND RIGHT HEART CATHETERIZATION WITH CORONARY ANGIOGRAM N/A 11/05/2013   Procedure: LEFT AND RIGHT HEART CATHETERIZATION WITH CORONARY ANGIOGRAM;  Surgeon: Peter M Martinique, MD;   Location: [facility] CATH LAB;  Service: Cardiovascular;  Laterality: N/A;   RIGHT HEART CATHETERIZATION N/A 01/14/2014   Procedure: RIGHT HEART CATH;  Surgeon: Larey Dresser, MD;  Location[facility] C[facility] LAB;  Service: Cardiovascular;  Laterality: N/A;    There were no vitals filed for this visit.   Subjective Assessment - 02/07/21 1026     Subjective Pt went to orthotist and got straps replaced on AFO.    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 CHF, HTN, hyperlipidemia, glomerulonephritis, pancytopenia/chronic anemia, seizure left-sided post stroke    Currently in Pain? No/denies    Pain Onset More than a month ago                [facility] PT Assessment - 02/07/21 1027       ROM / Strength   AROM / PROM / Strength AROM;PROM      AROM   Left Ankle Dorsiflexion -5      PROM   Left Ankle Dorsiflexion 0                           [facility] Adult PT Treatment/Exercise - 02/07/21 1027       Transfers   Five time sit to stand comments  Sit to stand from mat without hands x 5 with a couple attempts to rise at  times.      Ambulation/Gait   Ambulation/Gait Yes    Gait Comments Gait over treadmill x 10 min at 1.62mh. 2 min intervals (level, 3% incline, level, 3% incline, level). Pt was given cues to relax arms and try to increase step length. BP=148/86 aftere      Neuro Re-ed    Neuro Re-ed Details  At bottom of steps: stepping up on rockerboard positioned ant/post with LLE with 1 UE support tapping 2nd step with other foot x 10, step-ups on board x 10 without UE support CGA, rocking board ant/post x 20 without UE support                     PT Education - 02/07/21 1932     Education Details PT discussed checking goals next visit to decide d/c versus recert pending if pt has anything else he really wants to focus on.    Person(s) Educated Patient;Spouse    Methods Explanation     Comprehension Verbalized understanding              PT Short Term Goals - 01/12/21 1021       PT SHORT TERM GOAL #1   Title Pt will be independent with HEP for flexibility, strength and balance to continue gains on own.    Baseline 01/12/21 Pt has been working on current HEP most days unless he does a lot outside.    Time 4    Period Weeks    Status Achieved    Target Date 01/17/21      PT SHORT TERM GOAL #2   Title Pt will ambulate 500' on level indoor surfaces without AFO for improved gait and strength independently.    Baseline 01/12/21 Pt is able to ambulate >1300' without AD independently on level surfaces.    Time 4    Period Weeks    Status Achieved    Target Date 01/17/21      PT SHORT TERM GOAL #3   Title Pt will be assessed for new AFO by orthotist as current one is worn down.    Baseline Pt is stay waiting on visit with [facility].    Time 4    Period Weeks    Status On-going    Target Date 01/17/21      PT SHORT TERM GOAL #4   Title FGA will be assessed and LTG written.    Baseline FGA with left AFO assessed on 12/28/20 and LTG written    Time 4    Period Weeks    Status Achieved    Target Date 01/17/21               PT Long Term Goals - 02/07/21 1933       PT LONG TERM GOAL #1   Title Pt will ambulate >1000' without AD  on varied surfaces mod I for improved community mobility and activity tolerance.    Time 8    Period Weeks    Status New      PT LONG TERM GOAL #2   Title Pt will increase FGA from 13 to >19/30 for improved gait safety and balance.    Baseline 12/28/20 13/30    Time 8    Period Weeks    Status New      PT LONG TERM GOAL #3   Title Pt will increase left ankle DF PROM to >5 degrees for improved mobility.    Baseline 12/20/20 0 degrees.  02/07/21 left ankle DF=-5/0 degrees A/PROM    Time 8    Period Weeks    Status Not Met      PT LONG TERM GOAL #4   Title Pt will be able to complete 5 x sit to stand for improved balance and  functional strength without hands.    Baseline 01/03/21 3 reps completed.    Time 8    Period Weeks    Status New      PT LONG TERM GOAL #5   Title Pt will increase gait speed from 0.65 m/s to >0.8 m/s for improved gait safety.    Baseline 12/20/20 0.66ms without cane, with AFO    Time 8    Period Weeks    Status New                   Plan - 02/07/21 1933     Clinical Impression Statement Pt was able to increase bout time in treadmill with increased incline over intervals. Left ankle DF has improved actively but still 0 degrees passively.    Personal Factors and Comorbidities Time since onset of injury/illness/exacerbation;Comorbidity 3+    Comorbidities CHF, HTN, hyperlipidemia, glomerulonephritis, pancytopenia/chronic anemia, seizure left-sided post stroke    Examination-Activity Limitations Locomotion Level;Transfers;Stand;Stairs    Examination-Participation Restrictions Community Activity;Yard Work    Stability/Clinical Decision Making Stable/Uncomplicated    Rehab Potential Good    PT Frequency 2x / week   plus eval   PT Duration 8 weeks    PT Treatment/Interventions ADLs/Self Care Home Management;DME Instruction;Gait training;Stair training;Functional mobility training;Therapeutic activities;Therapeutic exercise;Balance training;Neuromuscular re-education;Manual techniques;Passive range of motion;Orthotic Fit/Training;Patient/family education;Vestibular    PT Next Visit Plan Monitor BP.  Check LTGs for possible d/c unless further issues.    Consulted and Agree with Plan of Care Patient;Family member/caregiver    Family Member Consulted Wife, BHassan Rowan            Patient will benefit from skilled therapeutic intervention in order to improve the following deficits and impairments:  Abnormal gait, Cardiopulmonary status limiting activity, Decreased balance, Decreased activity tolerance, Decreased mobility, Decreased strength, Impaired sensation, Decreased range of motion,  Impaired flexibility  Visit Diagnosis: Other abnormalities of gait and mobility  Muscle weakness (generalized)     Problem List Patient Active Problem List   Diagnosis Date Noted   Essential hypertension 05/25/2019   Spasm 03/23/2019   Acute blood loss anemia    Steroid-induced hyperglycemia    Acute on chronic anemia    Seizures (HCC)    Spastic hemiparesis (HCC)    ANCA-associated vasculitis (HCC)    Hypertension    Thrombocytopenia (HCC)    Acute unilateral cerebral infarction in a watershed distribution ([facility] 01/15/2019   Weakness    CVA (cerebral vascular accident) ([place] 01/12/2019   Malnutrition of moderate degree 07/31/2018   Elevated serum protein level    Multiple myeloma (HCC)    Pancytopenia (HCC)    Normocytic normochromic anemia 07/16/2018   Acute kidney failure ([facility] 07/16/2018   NICM (nonischemic cardiomyopathy) ([place] 11/12/2013   History of ETOH abuse 11/12/2013   Chronic combined systolic and diastolic CHF (congestive heart failure) ([place] 11/12/2013   Congestive dilated cardiomyopathy ([place] 11/04/2013   Malignant hypertension     EElecta Sniff PT, DPT, NCS 02/07/2021, 7:37 PM  [facility] [address]Phone: [phone]  Fax:  [phone] Name: Jeremy VENEYMRN: [id number] of Birth:  [birth year]

## 2021-02-09 ENCOUNTER — Ambulatory Visit: Payer: 59

## 2021-02-09 ENCOUNTER — Other Ambulatory Visit: Payer: Self-pay

## 2021-02-09 DIAGNOSIS — R2689 Other abnormalities of gait and mobility: Secondary | ICD-10-CM

## 2021-02-09 DIAGNOSIS — G8110 Spastic hemiplegia affecting unspecified side: Secondary | ICD-10-CM | POA: Diagnosis not present

## 2021-02-09 DIAGNOSIS — I69354 Hemiplegia and hemiparesis following cerebral infarction affecting left non-dominant side: Secondary | ICD-10-CM

## 2021-02-09 NOTE — Therapy (Signed)
[facility] [address] [place], [place], [address] Phone: [phone]   Fax:  [phone]  Physical Therapy Treatment/Discharge Summary  Patient Details  Name: Jeremy Grant MRN: [id number] Date of Birth: 1960-07-12 Referring Provider (PT): Frann Rider PHYSICAL THERAPY DISCHARGE SUMMARY  Visits from Start of Care: 12  Current functional level related to goals / functional outcomes: See clinical impression and goals for more information.   Remaining deficits: Left hemiparesis, high level balance   Education / Equipment: HEP   Patient agrees to discharge. Patient goals were met. Patient is being discharged due to meeting the stated rehab goals.   Encounter Date: 02/09/2021   PT End of Session - 02/09/21 1017     Visit Number 12    Number of Visits 17    Date for PT Re-Evaluation 02/14/21    PT Start Time 1016   discharge visit   PT Stop Time 47    PT Time Calculation (min) 36 min    Equipment Utilized During Treatment Gait belt    Activity Tolerance Patient tolerated treatment well    Behavior During Therapy WFL for tasks assessed/performed             Past Medical History:  Diagnosis Date   Arthritis    CHF (congestive heart failure) (H[facility])    Combined systolic and diastolic cardiac dysfunction    Echo 11/03/2013 EF 76%, grade 3 diastolic dysfunction   Hypertension    NICM (nonischemic cardiomyopathy) ([place])    a. L/RHC (11/05/13): RA: 3, RV 52/5, PA 49/19 (31), PCWP 10, AO 166/93, PA 67%, Fick CO/CI: 5.71/2.97, Lmain: normal, LAD: large, without signficant dz, first diagonal has 20% dz at ostium, LCx: normal, RCA: 30% stenosis at the bifurcation of PDA and PLOM   Stroke [facility])     Past Surgical History:  Procedure Laterality Date   BIOPSY  01/31/2019   Procedure: BIOPSY;  Surgeon: Otis Brace, MD;  Location: [facility] ENDOSCOPY;  Service: Gastroenterology;;   ESOPHAGOGASTRODUODENOSCOPY N/A  01/31/2019   Procedure: ESOPHAGOGASTRODUODENOSCOPY (EGD);  Surgeon: Otis Brace, MD;  Location: [facility] ENDOSCOPY;  Service: Gastroenterology;  Laterality: N/A;   IR FLUORO GUIDE CV LINE RIGHT  07/25/2018   IR US GUIDE VASC ACCESS RIGHT  07/25/2018   LEFT AND RIGHT HEART CATHETERIZATION WITH CORONARY ANGIOGRAM N/A 11/05/2013   Procedure: LEFT AND RIGHT HEART CATHETERIZATION WITH CORONARY ANGIOGRAM;  Surgeon: Peter M Martinique, MD;  Location: [facility] CATH LAB;  Service: Cardiovascular;  Laterality: N/A;   RIGHT HEART CATHETERIZATION N/A 01/14/2014   Procedure: RIGHT HEART CATH;  Surgeon: Larey Dresser, MD;  Location: [facility] CATH LAB;  Service: Cardiovascular;  Laterality: N/A;    There were no vitals filed for this visit.   Subjective Assessment - 02/09/21 1017     Subjective Pt reports he is doing well. Denies any big concerns around home.    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 CHF, HTN, hyperlipidemia, glomerulonephritis, pancytopenia/chronic anemia, seizure left-sided post stroke    Currently in Pain? No/denies    Pain Onset More than a month ago                [facility] PT Assessment - 02/09/21 1019       Functional Gait  Assessment   Gait assessed  Yes    Gait Level Surface Walks 20 ft in less than 7 sec but greater than 5.5 sec, uses assistive device,  slower speed, mild gait deviations, or deviates 6-10 in outside of the 12 in walkway width.    Change in Gait Speed Able to smoothly change walking speed without loss of balance or gait deviation. Deviate no more than 6 in outside of the 12 in walkway width.    Gait with Horizontal Head Turns Performs head turns smoothly with no change in gait. Deviates no more than 6 in outside 12 in walkway width    Gait with Vertical Head Turns Performs head turns with no change in gait. Deviates no more than 6 in outside 12 in walkway width.    Gait and Pivot Turn Pivot  turns safely within 3 sec and stops quickly with no loss of balance.    Step Over Obstacle Is able to step over one shoe box (4.5 in total height) without changing gait speed. No evidence of imbalance.    Gait with Narrow Base of Support Ambulates less than 4 steps heel to toe or cannot perform without assistance.    Gait with Eyes Closed Walks 20 ft, slow speed, abnormal gait pattern, evidence for imbalance, deviates 10-15 in outside 12 in walkway width. Requires more than 9 sec to ambulate 20 ft.    Ambulating Backwards Walks 20 ft, uses assistive device, slower speed, mild gait deviations, deviates 6-10 in outside 12 in walkway width.    Steps Alternating feet, must use rail.    Total Score 21                           [facility] Adult PT Treatment/Exercise - 02/09/21 1019       Ambulation/Gait   Ambulation/Gait Yes    Ambulation/Gait Assistance 7: Independent    Ambulation/Gait Assistance Details Pt was reminded to not scuff right foot with increasing left stance time.    Ambulation Distance (Feet) 1000 Feet    Assistive device None    Gait Pattern Step-through pattern;Decreased stance time - left    Ambulation Surface Level;Unlevel;Indoor;Outdoor;Paved;Grass    Gait velocity 10.48 without AFO= 0.32m/s                     PT Education - 02/09/21 1131     Education Details Discussed progress towards goals. Discharge plan.    Person(s) Educated Patient    Methods Explanation    Comprehension Verbalized understanding              PT Short Term Goals - 02/09/21 1132       PT SHORT TERM GOAL #1   Title Pt will be independent with HEP for flexibility, strength and balance to continue gains on own.    Baseline 01/12/21 Pt has been working on current HEP most days unless he does a lot outside.    Time 4    Period Weeks    Status Achieved    Target Date 01/17/21      PT SHORT TERM GOAL #2   Title Pt will ambulate 500' on level indoor surfaces without  AFO for improved gait and strength independently.    Baseline 01/12/21 Pt is able to ambulate >1300' without AD independently on level surfaces.    Time 4    Period Weeks    Status Achieved    Target Date 01/17/21      PT SHORT TERM GOAL #3   Title Pt will be assessed for new AFO by orthotist as current one is worn down.  Baseline Straps were replaced on current AFO that is working well for pt.    Time 4    Period Weeks    Status Achieved    Target Date 01/17/21      PT SHORT TERM GOAL #4   Title FGA will be assessed and LTG written.    Baseline FGA with left AFO assessed on 12/28/20 and LTG written    Time 4    Period Weeks    Status Achieved    Target Date 01/17/21               PT Long Term Goals - 02/09/21 1022       PT LONG TERM GOAL #1   Title Pt will ambulate >1000' without AD  on varied surfaces mod I for improved community mobility and activity tolerance.    Baseline 02/09/21 Pt ambulated outside >1000' without AD or AFO independently    Time 8    Period Weeks    Status Achieved      PT LONG TERM GOAL #2   Title Pt will increase FGA from 13 to >19/30 for improved gait safety and balance.    Baseline 12/28/20 13/30. 02/09/21 21/30    Time 8    Period Weeks    Status Achieved      PT LONG TERM GOAL #3   Title Pt will increase left ankle DF PROM to >5 degrees for improved mobility.    Baseline 12/20/20 0 degrees. 02/07/21 left ankle DF=-5/0 degrees A/PROM    Time 8    Period Weeks    Status Not Met      PT LONG TERM GOAL #4   Title Pt will be able to complete 5 x sit to stand for improved balance and functional strength without hands.    Baseline 01/03/21 3 reps completed.. 02/09/21 5 sit to stands without hands    Time 8    Period Weeks    Status Achieved      PT LONG TERM GOAL #5   Title Pt will increase gait speed from 0.65 m/s to >0.8 m/s for improved gait safety.    Baseline 12/20/20 0.64m/s without cane, with AFO. 02/09/21 0.81m/s without AFO    Time 8     Period Weeks    Status New                   Plan - 02/09/21 1127     Clinical Impression Statement Pt has shown great progress towards goals meeting 4/5 LTGs today. The only goal he did not meet was for left DF range with it maintaining at 0 degrees passively. Encouraged pt to continue to stretch. He is ambulating >1000' and did well without AFO but does affect gait quality some so encouraged to wear outside on nonlevel surfaces for better left stance time. Pt is ambulating at gait speed of 0.20m/s which indicates a safe community ambulator. His FGA improved to 21/30 showing improving balance but still in fall risk category. Is better than when discharged a year ago. Demonstrated improved functional strength with performing 5 sit to stands without hands now. PT discharging today to home program. Pt in agreement.    Personal Factors and Comorbidities Time since onset of injury/illness/exacerbation;Comorbidity 3+    Comorbidities CHF, HTN, hyperlipidemia, glomerulonephritis, pancytopenia/chronic anemia, seizure left-sided post stroke    Examination-Activity Limitations Locomotion Level;Transfers;Stand;Stairs    Examination-Participation Restrictions Community Activity;Yard Work    Stability/Clinical Decision Making Stable/Uncomplicated  Rehab Potential Good    PT Frequency 2x / week   plus eval   PT Duration 8 weeks    PT Treatment/Interventions ADLs/Self Care Home Management;DME Instruction;Gait training;Stair training;Functional mobility training;Therapeutic activities;Therapeutic exercise;Balance training;Neuromuscular re-education;Manual techniques;Passive range of motion;Orthotic Fit/Training;Patient/family education;Vestibular    PT Next Visit Plan Discharged today.    Consulted and Agree with Plan of Care Patient;Family member/caregiver    Family Member Consulted Wife, [name]             Patient will benefit from skilled therapeutic intervention in order to improve the  following deficits and impairments:  Abnormal gait, Cardiopulmonary status limiting activity, Decreased balance, Decreased activity tolerance, Decreased mobility, Decreased strength, Impaired sensation, Decreased range of motion, Impaired flexibility  Visit Diagnosis: Other abnormalities of gait and mobility  Hemiplegia and hemiparesis following cerebral infarction affecting left non-dominant side [facility])     Problem List Patient Active Problem List   Diagnosis Date Noted   Essential hypertension 05/25/2019   Spasm 03/23/2019   Acute blood loss anemia    Steroid-induced hyperglycemia    Acute on chronic anemia    Seizures (HCC)    Spastic hemiparesis (HCC)    ANCA-associated vasculitis (HCC)    Hypertension    Thrombocytopenia (HCC)    Acute unilateral cerebral infarction in a watershed distribution ([place]) 01/15/2019   Weakness    CVA (cerebral vascular accident) ([place]) 01/12/2019   Malnutrition of moderate degree 07/31/2018   Elevated serum protein level    Multiple myeloma (HCC)    Pancytopenia (HCC)    Normocytic normochromic anemia 07/16/2018   Acute kidney failure ([place]) 07/16/2018   NICM (nonischemic cardiomyopathy) ([place]) 11/12/2013   History of ETOH abuse 11/12/2013   Chronic combined systolic and diastolic CHF (congestive heart failure) ([place]) 11/12/2013   Congestive dilated cardiomyopathy ([place]) 11/04/2013   Malignant hypertension     Electa Sniff, PT, DPT, NCS 02/09/2021, 11:33 AM  [facility] [address] [place], [place], [address] Phone: [phone]   Fax:  [phone]  Name: KONNER SAIZ MRN: [id number] Date of Birth: 1960-08-16

## 2021-02-14 ENCOUNTER — Ambulatory Visit: Payer: 59

## 2021-02-16 ENCOUNTER — Ambulatory Visit: Payer: 59

## 2021-02-21 ENCOUNTER — Ambulatory Visit: Payer: 59

## 2021-02-23 ENCOUNTER — Ambulatory Visit: Payer: 59

## 2021-02-28 ENCOUNTER — Ambulatory Visit (HOSPITAL_COMMUNITY): Payer: 59 | Attending: Cardiovascular Disease

## 2021-02-28 ENCOUNTER — Other Ambulatory Visit: Payer: Self-pay

## 2021-02-28 DIAGNOSIS — I5032 Chronic diastolic (congestive) heart failure: Secondary | ICD-10-CM | POA: Insufficient documentation

## 2021-02-28 DIAGNOSIS — Z01810 Encounter for preprocedural cardiovascular examination: Secondary | ICD-10-CM | POA: Insufficient documentation

## 2021-02-28 LAB — ECHOCARDIOGRAM COMPLETE
Area-P 1/2: 3 cm2
S' Lateral: 3 cm

## 2021-03-07 ENCOUNTER — Telehealth: Payer: Self-pay | Admitting: Cardiology

## 2021-03-07 ENCOUNTER — Telehealth: Payer: Self-pay | Admitting: Student

## 2021-03-07 NOTE — Telephone Encounter (Signed)
Bonnetta Barry, RN Caller: Unspecified (Today,  9:02 AM) Records were faxed to [phone]  fsw 03/07/21

## 2021-03-07 NOTE — Telephone Encounter (Signed)
Needing  a copy of the pts last echocardiogram stat for continuation of care

## 2021-03-07 NOTE — Telephone Encounter (Signed)
Spoke with [employer] team They need copy of echo report sent Their fax is [phone]  Asked that they send a request on their letterhead to our office for our medical records team to process

## 2021-03-14 ENCOUNTER — Other Ambulatory Visit: Payer: Self-pay

## 2021-03-14 ENCOUNTER — Encounter: Payer: 59 | Attending: Physical Medicine & Rehabilitation | Admitting: Physical Medicine & Rehabilitation

## 2021-03-14 ENCOUNTER — Encounter: Payer: Self-pay | Admitting: Physical Medicine & Rehabilitation

## 2021-03-14 VITALS — BP 162/97 | HR 66 | Ht 73.0 in | Wt 172.4 lb

## 2021-03-14 DIAGNOSIS — G8110 Spastic hemiplegia affecting unspecified side: Secondary | ICD-10-CM | POA: Insufficient documentation

## 2021-03-14 NOTE — Patient Instructions (Signed)
Keep up with achilles stretching as well as walking

## 2021-03-14 NOTE — Progress Notes (Signed)
Subjective:    Patient ID: Jeremy Grant, male    DOB: [birth year], 60 y.o.   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 Finished OP PT in September, Still exercising at home, walking and toe lifts Still using carbon fiber rather than hinged AFO after PT and orthotics evaluation. No falls Mod I dressing and bathing using shower chair Has some left ankle pain with ambulation.  No injury to that area Pain Inventory Average Pain 3 Pain Right Now 3 My pain is intermittent, dull, and aching  LOCATION OF PAIN  leg, ankle, toes  BOWEL Number of stools per week: 7 Oral laxative use No  Type of laxative na Enema or suppository use No  History of colostomy No  Incontinent No   BLADDER Normal In and out cath, frequency na Able to self cath  na Bladder incontinence No  Frequent urination No  Leakage with coughing No  Difficulty starting stream No  Incomplete bladder emptying No    Mobility use a cane how many minutes can you walk? 15 ability to climb steps?  yes do you drive?  yes  Function not employed: date last employed 07/2019  Neuro/Psych weakness numbness tremor spasms anxiety  Prior Studies Any changes since last visit?  no  Physicians involved in your care Any changes since last visit?  no   Family History  Problem Relation Age of Onset   Heart attack Father 28   Heart disease Father    Arrhythmia Father    Hypertension Brother    Hypertension Brother    Social History   Socioeconomic History   Marital status: Married    Spouse name: BRENDA   Number of children: 0   Years of education: 12   Highest education level: 12th grade  Occupational History   Occupation: Medical sales representative  Tobacco Use   Smoking status: Never   Smokeless tobacco: Never  Substance and Sexual Activity   Alcohol use: Yes    Alcohol/week: 3.0  standard drinks    Types: 3 Glasses of wine per week   Drug use: No   Sexual activity: Not Currently    Birth control/protection: None  Other Topics Concern   Not on file  Social History Narrative   Lives with wife at home with dog Abbie.     Social Determinants of Health   Financial Resource Strain: Not on file  Food Insecurity: Not on file  Transportation Needs: Not on file  Physical Activity: Not on file  Stress: Not on file  Social Connections: Not on file   Past Surgical History:  Procedure Laterality Date   BIOPSY  01/31/2019   Procedure: BIOPSY;  Surgeon: Otis Brace, MD;  Location: [place];  Service: Gastroenterology;;   ESOPHAGOGASTRODUODENOSCOPY N/A 01/31/2019   Procedure: ESOPHAGOGASTRODUODENOSCOPY (EGD);  Surgeon: Otis Brace, MD;  Location: [facility] ENDOSCOPY;  Service: Gastroenterology;  Laterality: N/A;   IR FLUORO GUIDE CV LINE RIGHT  07/25/2018   IR US GUIDE VASC ACCESS RIGHT  07/25/2018   LEFT AND RIGHT HEART CATHETERIZATION WITH CORONARY ANGIOGRAM N/A 11/05/2013   Procedure: LEFT AND RIGHT HEART CATHETERIZATION WITH CORONARY ANGIOGRAM;  Surgeon: Peter M Martinique, MD;  Location: [facility] CATH LAB;  Service: Cardiovascular;  Laterality: N/A;   RIGHT HEART CATHETERIZATION N/A 01/14/2014   Procedure: RIGHT HEART CATH;  Surgeon: Larey Dresser, MD;  Location: [facility] CATH LAB;  Service: Cardiovascular;  Laterality: N/A;   Past Medical History:  Diagnosis Date   Arthritis    CHF (congestive heart failure) (HCC)    Combined systolic and diastolic cardiac dysfunction    Echo 11/03/2013 EF 32%, grade 3 diastolic dysfunction   Hypertension    NICM (nonischemic cardiomyopathy) ([place])    a. L/RHC (11/05/13): RA: 3, RV 52/5, PA 49/19 (31), PCWP 10, AO 166/93, PA 67%, Fick CO/CI: 5.71/2.97, Lmain: normal, LAD: large, without signficant dz, first diagonal has 20% dz at ostium, LCx: normal, RCA: 30% stenosis at the bifurcation of PDA and PLOM   Stroke (HCC)    BP (!) 162/97    Pulse 66   Ht 6\' 1"  (1.854 m)   Wt 172 lb 6.4 oz (78.2 kg)   SpO2 98%   BMI 22.75 kg/m   Opioid Risk Score:   Fall Risk Score:  `1  Depression screen PHQ 2/9  Depression screen [facility] 2/9 04/21/2020 02/17/2019  Decreased Interest 0 1  Down, Depressed, Hopeless 2 1  PHQ - 2 Score 2 2  Altered sleeping 2 2  Tired, decreased energy 0 2  Change in appetite 0 0  Feeling bad or failure about yourself  2 1  Trouble concentrating 0 0  Moving slowly or fidgety/restless 0 0  Suicidal thoughts 0 0  PHQ-9 Score 6 7  Difficult doing work/chores - Somewhat difficult  Some recent data might be hidden     Review of Systems     Objective:   Physical Exam Vitals and nursing note reviewed.  Constitutional:      Appearance: He is normal weight.  HENT:     Head: Normocephalic and atraumatic.  Eyes:     Extraocular Movements: Extraocular movements intact.     Conjunctiva/sclera: Conjunctivae normal.     Pupils: Pupils are equal, round, and reactive to light.  Musculoskeletal:     Left ankle: Tenderness present over the ATF ligament. Decreased range of motion.     Left Achilles Tendon: Tenderness present. No defects. Thompson's test negative.  Skin:    General: Skin is warm and dry.  Neurological:     Mental Status: He is alert and oriented to person, place, and time.     Comments: Motor strength is 4/5 in the left deltoid bicep tricep grip as well as hip flexor knee extensor and trace ankle dorsiflexor. Right upper and right lower extremity strength is 5/5 No evidence of spasticity in the left lower extremity. Ambulates without assistive device except for left AFO no evidence of toe drag or knee instability. Positive Romberg Unable to perform tandem gait  Psychiatric:        Mood and Affect: Mood normal.        Behavior: Behavior normal.          Assessment & Plan:  1.  History of right ACA MCA infarct watershed distribution continue aspirin as per neurology will be having  cataract surgery in the upcoming month.  As patient discussed with ophthalmology as well as neurology whether or not he needs to stop the aspirin. Has plateaued in terms of level of function do not think he needs additional therapy. Physical medicine and rehab follow-up as needed 2.  Left ankle contracture continue stretching continue ambulation.  Do not think this is a tone issue therefore I do not think any Botox injections are needed.

## 2021-05-04 ENCOUNTER — Other Ambulatory Visit: Payer: Self-pay | Admitting: Adult Health

## 2021-05-14 HISTORY — PX: CATARACT EXTRACTION: SUR2

## 2021-05-25 ENCOUNTER — Telehealth: Payer: Self-pay | Admitting: Adult Health

## 2021-05-25 ENCOUNTER — Ambulatory Visit: Payer: Managed Care, Other (non HMO) | Admitting: Adult Health

## 2021-05-25 ENCOUNTER — Encounter: Payer: Self-pay | Admitting: Adult Health

## 2021-05-25 VITALS — BP 172/95 | HR 65 | Ht 72.0 in | Wt 170.0 lb

## 2021-05-25 DIAGNOSIS — Z8673 Personal history of transient ischemic attack (TIA), and cerebral infarction without residual deficits: Secondary | ICD-10-CM

## 2021-05-25 DIAGNOSIS — E785 Hyperlipidemia, unspecified: Secondary | ICD-10-CM

## 2021-05-25 DIAGNOSIS — Z5181 Encounter for therapeutic drug level monitoring: Secondary | ICD-10-CM

## 2021-05-25 DIAGNOSIS — I639 Cerebral infarction, unspecified: Secondary | ICD-10-CM | POA: Diagnosis not present

## 2021-05-25 DIAGNOSIS — R569 Unspecified convulsions: Secondary | ICD-10-CM

## 2021-05-25 DIAGNOSIS — G8114 Spastic hemiplegia affecting left nondominant side: Secondary | ICD-10-CM

## 2021-05-25 MED ORDER — LEVETIRACETAM 500 MG PO TABS: 500.0000 mg | ORAL_TABLET | Freq: Two times a day (BID) | ORAL | 3 refills | Status: DC

## 2021-05-25 NOTE — Telephone Encounter (Signed)
Contacted wife back, informed her Rx was sent correct at desp 180 tab. Pt is taking 2 daily which is a 90 day supply. She will contact pharmacy.

## 2021-05-25 NOTE — Progress Notes (Deleted)
[facility] [address] [place]. [place] [address] [phone]       STROKE FOLLOW UP NOTE  [name] Date of Birth:  [birth year] Medical Record Number:  [id number]   Reason for Referral: stroke follow up    CHIEF COMPLAINT:  Chief Complaint  Patient presents with   Follow-up    RM 3 with spouse Jeremy Grant Pt is well and stable, no new sz or stroke concerns. Mentions numbness on bottom of foot      HPI:   Update 05/25/2021 JM: Patient returns for 58-month stroke and seizure follow-up accompanied by his wife.  Stable from stroke standpoint without new stroke/TIA symptoms.  Residual LLE weakness - completed PT with benefit. Eval by Dr. Letta Pate for possible botox - per note review, no evidence of increased tone after working with PT therefore botox not indicated. Remains on tizanidine 8mg  nightly (increased by Dr. Letta Pate).  Compliant on aspirin and atorvastatin -denies side effects.  Blood pressure today ***.  Remains on Keppra and Depakote without any seizure activity.  Denies side effects.  Recent lab work ***.  No further concerns at this time      History provided for reference purposes only Update 11/22/2020 JM: Jeremy Grant returns for 6 month stroke and seizure follow up accompanied by his wife. Stable from stroke standpoint without new stroke/TIA symptoms.  He continues to experience left leg weakness as well as pain, numbness on the outer portion of his foot, cramping/spasm and increased ankle stiffness.  He does keep active around his home but does not do any specific therapy exercises or stretching.  He continues to use AFO brace for ambulation and more recently has had difficulty ensuring that the brace is in place. They are requesting possibility of a new AFO brace. Compliant on aspirin and atorvastatin without associated side effects.  Blood pressure today elevated.  Monitors at home and has been elevated per patient and wife with recent  levels 140-160/70-100. He is currently working with cardiology and nephrology for difficulty to control blood pressure.  Continues to work with [facility] in the process of being placed on kidney transplant list. Compliant on Depakote and Keppra tolerating without side effects and denies any recent seizure activity. No further concerns at this time.  Update 04/21/2020 JM: Jeremy Grant returns for 35-month stroke and seizure follow-up accompanied by his wife.  Stable from stroke standpoint with residual left hemiparesis and gait impairment.  Completed PT in September as he made significant improvements but unfortunately suffered a fall at [facility] on 03/31/2020 without head injury.  He did experience bilateral knee pain post fall limiting ambulation and has since noticed sensation of left leg heaviness.  He has not been doing routine exercises as recommended during therapy sessions. C/o gradual worsening of left eye blurred vision previously diagnosed with cataracts.  Denies acute onset, diplopia or visual loss.  Denies new stroke/TIA symptoms.  Remains on aspirin and atorvastatin without bleeding or bruising.  Blood pressure today elevated but monitors at home which has been stable.  Denies seizure activity currently on Depakote 500 mg twice daily and Keppra 500 mg twice daily without side effects.  Wife is concerned regarding increased depression and anxiety due to residual stroke deficits and difficulty doing activities he previously enjoyed.  Denies prior history of depression or anxiety or family history.  He is not currently on any type of medication management nor is interested but wife questions use of counseling for possible benefit.  No  further concerns at this time.  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  He is accompanied by his wife.  He has been stable since prior visit 6 months ago without new stroke/TIA symptoms  or recurrent seizure activity.  Continues to work with outpatient PT for residual stroke deficits of LLE weakness, gait impairment and imbalance.  He endorses ongoing improvement. He ambulates with cane and AFO brace occasionally depending on distance and terrain.  Continues on tizanidine 4 mg nightly with ongoing benefit of spasticity.  He continues on Depakote 500 mg twice daily and Keppra 500 mg twice daily tolerating well.  Continues on aspirin 81 mg daily and atorvastatin 80 mg daily for secondary stroke prevention.  Blood pressure today 154/74.  Continues to follow closely with nephrology history of thrombocytopenia and vasculitis currently in remission.  No concerns at this time.   Update 06/18/2019 JM: Jeremy Grant is a 61 year old male who is being seen today for stroke follow-up accompanied by his wife.  Residual stroke deficits left sided weakness but greatly improving with ongoing participation of physical therapy.  Improvement of prior spasticity with ongoing use of tizanidine nightly managed by physical medicine and rehab Dr. Posey Pronto.  he is now ambulatory with a cane and denies any recent falls.  He continues to use AFO brace when he leaves the house.  After prior visit, discussion with nephrologist  Dr. Hollie Salk regarding possibly decreasing Depakote dosage due to thrombocytopenia.  Depakote dosage decreased and currently on 500 mg twice daily with patient/wife reporting improvement of lab work.  He does have follow-up with nephrologist next week.  He is also continued on Keppra 500 mg twice daily.  Denies any recent seizure activities.  Only possible side effects includes upper extremity mild tremors which she has not had previously but denies interference with ADLs.  His wife questions possible return to driving.  Continues on aspirin 81 mg daily and atorvastatin 80 mg daily for secondary stroke prevention without side effects.  Blood pressure today initially elevated at 181/88 but on recheck 138/80.   He does monitor at home and does endorse whitecoat syndrome and typically blood pressure at home typically 1 30-1 40/60 to 70s.  Denies new or worsening stroke/TIA symptoms.    Initial visit 03/03/2019 JM: Jeremy Grant is a 62 year old male who is being seen today for hospital follow-up.  Residual deficits of spastic left hemiparesis and continues to work with outpatient PT with reports of improvement.  He is able to ambulate with a cane but will use wheelchair for long distance.  He will occasionally have LLE spasms and continues on tizanidine with benefit.  No reoccurring seizure activity with ongoing use of Depakote 1000 mg twice daily and Keppra 500 mg twice daily.  Tolerating well without side effects.  Continues on aspirin 81 mg with mild bruising but no bleeding.  Continues on atorvastatin 80 mg without myalgias.  Blood pressure today 124/72.  He continues to follow with Dr. Hollie Salk at [facility] with recent visit last week and initiated carvedilol due to continued elevated blood pressures.  He does not routinely monitor at home.  30-day cardiac event ordered but has not been completed at this time.  No further concerns at this time.  Stroke admission 01/12/2019: Jeremy Grant is a 61 y.o. male with history of nonischemic cardiomyopathy, hypertension, combination of systolic and diastolic cardiac dysfunction, congestive heart failure and arthritis presented  on 01/12/2019 with L leg weakness.  Stroke work-up showed patchy right corpus callosum infarct with possible watershed secondary to unknown source.  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  MRA showed right ICA stenosis and pericallosal region (questionable emboli).  Carotid Dopplers show bilateral ICA 1 to 39% stenosis in VAs antegrade.  2D echo EF of 55% without  cardiac source of embolus identified.  TCD no evidence of PFO.  Given pancytopenia, he is not a good AC candidate therefore TEE/loop not considered at that time.  Recommended 30-day cardiac event monitor outpatient.  LDL 122.  A1c 5.4.  Initiated DAPT for 3 weeks and aspirin alone.  HTN stable.  Initiated atorvastatin 80 mg daily for HLD and secondary stroke prevention.  No evidence or history of DM.  Other stroke risk factors include EtOH use, CAD, systolic and diastolic CHF, nonischemic cardiomyopathy and prior stroke on imaging.  Other active problems include glomerulonephritis, pancytopenia, shingles in May with postherpetic neuralgia now resolved as well as lumbar spinal disease.  Residual deficits of left hemiparesis and discharged to CIR for ongoing therapies on 01/15/2019.  During CIR admission, Plavix discontinued as well as Lovenox due to excessive bruising with bouts of anemia requiring transfusions.  Underwent EGD on 01/31/2019 with no active bleeding but did show nonbleeding distal esophageal ulcer as well as erosive gastropathy and recommended follow-up with GI outpatient.  He also had bouts of seizures with follow-up EEG negative and initiated Depakote 1000 mg twice daily and Keppra 500 mg twice daily.  He was discharged home with recommendation of outpatient therapies on 02/04/2019.       ROS:   14 system review of systems performed and negative with exception of those listed in HPI   PMH:  Past Medical History:  Diagnosis Date   Arthritis    CHF (congestive heart failure) (HCC)    Combined systolic and diastolic cardiac dysfunction    Echo 11/03/2013 EF 42%, grade 3 diastolic dysfunction   Hypertension    NICM (nonischemic cardiomyopathy) ([place])    a. L/RHC (11/05/13): RA: 3, RV 52/5, PA 49/19 (31), PCWP 10, AO 166/93, PA 67%, Fick CO/CI: 5.71/2.97, Lmain: normal, LAD: large, without signficant dz, first diagonal has 20% dz at ostium, LCx: normal, RCA: 30% stenosis at the bifurcation of  PDA and PLOM   Stroke [facility])     PSH:  Past Surgical History:  Procedure Laterality Date   BIOPSY  01/31/2019   Procedure: BIOPSY;  Surgeon: Otis Brace, MD;  Location: [facility];  Service: Gastroenterology;;   ESOPHAGOGASTRODUODENOSCOPY N/A 01/31/2019   Procedure: ESOPHAGOGASTRODUODENOSCOPY (EGD);  Surgeon: Otis Brace, MD;  Location: [facility] ENDOSCOPY;  Service: Gastroenterology;  Laterality: N/A;   IR FLUORO GUIDE CV LINE RIGHT  07/25/2018   IR US GUIDE VASC ACCESS RIGHT  07/25/2018   LEFT AND RIGHT HEART CATHETERIZATION WITH CORONARY ANGIOGRAM N/A 11/05/2013   Procedure: LEFT AND RIGHT HEART CATHETERIZATION WITH CORONARY ANGIOGRAM;  Surgeon: Peter M Martinique, MD;  Location: [facility] CATH LAB;  Service: Cardiovascular;  Laterality: N/A;   RIGHT HEART CATHETERIZATION N/A 01/14/2014   Procedure: RIGHT HEART CATH;  Surgeon: Larey Dresser, MD;  Location: [facility] CATH LAB;  Service: Cardiovascular;  Laterality: N/A;    Social History:  Social History   Socioeconomic History   Marital status: Married    Spouse name: Jeremy Grant   Number of children:  0   Years of education: 71   Highest education level: 12th grade  Occupational History   Occupation: Medical sales representative  Tobacco Use   Smoking status: Never   Smokeless tobacco: Never  Substance and Sexual Activity   Alcohol use: Yes    Alcohol/week: 3.0 standard drinks    Types: 3 Glasses of wine per week   Drug use: No   Sexual activity: Not Currently    Birth control/protection: None  Other Topics Concern   Not on file  Social History Narrative   Lives with wife at home with dog Abbie.     Social Determinants of Health   Financial Resource Strain: Not on file  Food Insecurity: Not on file  Transportation Needs: Not on file  Physical Activity: Not on file  Stress: Not on file  Social Connections: Not on file  Intimate Partner Violence: Not on file    Family History:  Family History  Problem Relation Age of Onset   Heart attack Father  48   Heart disease Father    Arrhythmia Father    Hypertension Brother    Hypertension Brother     Medications:   Current Outpatient Medications on File Prior to Visit  Medication Sig Dispense Refill   acetaminophen (TYLENOL) 325 MG tablet Take 2 tablets (650 mg total) by mouth every 6 (six) hours as needed for moderate pain.     amLODipine (NORVASC) 10 MG tablet Take 1 tablet (10 mg total) by mouth daily. 30 tablet 1   aspirin EC 81 MG EC tablet Take 1 tablet (81 mg total) by mouth daily.     atorvastatin (LIPITOR) 80 MG tablet Take 1 tablet (80 mg total) by mouth daily at 6 PM. 30 tablet 0   azaTHIOprine (IMURAN) 50 MG tablet Take 1 tablet (50 mg total) by mouth daily. 30 tablet 1   carvedilol (COREG) 12.5 MG tablet Take 12.5 mg by mouth 2 (two) times daily with a meal.     divalproex (DEPAKOTE ER) 500 MG 24 hr tablet TAKE 1 TABLET BY MOUTH IN THE MORNING AND 1 AT BEDTIME 180 tablet 3   doxazosin (CARDURA) 2 MG tablet 4 mg 2 (two) times daily. Increased to 4 mg 2x/day per pt conversation with nephrologist     levETIRAcetam (KEPPRA) 500 MG tablet Take 1 tablet (500 mg total) by mouth 2 (two) times daily. 180 tablet 3   pantoprazole (PROTONIX) 40 MG tablet Take 1 tablet (40 mg total) by mouth daily. 30 tablet 0   predniSONE (DELTASONE) 5 MG tablet Take 5 mg by mouth daily with breakfast.     tiZANidine (ZANAFLEX) 4 MG tablet Take 1 tablet (4 mg total) by mouth every 8 (eight) hours as needed for muscle spasms. (Patient taking differently: Take 4 mg by mouth every 8 (eight) hours as needed for muscle spasms. 2 pills at night) 90 tablet 1   No current facility-administered medications on file prior to visit.    Allergies:   Allergies  Allergen Reactions   Hydralazine Hcl      Physical Exam  Vitals:   05/25/21 0836  BP: (!) 172/95  Pulse: 65  Weight: 170 lb (77.1 kg)  Height: 6' (1.829 m)     Body mass index is 23.06 kg/m. No results found.  General: well developed, well  nourished, pleasant middle-age Caucasian male, seated, in no evident distress Head: head normocephalic and atraumatic.   Neck: supple with no carotid or supraclavicular bruits Cardiovascular: regular rate  and rhythm, no murmurs Musculoskeletal: no deformity Skin:  no rash/petichiae Vascular:  Normal pulses all extremities   Neurologic Exam Mental Status: Awake and fully alert.  Normal speech and language. Oriented to place and time. Recent and remote memory intact. Attention span, concentration and fund of knowledge appropriate. Mood and affect appropriate.  Cranial Nerves: Pupils equal, briskly reactive to light. Extraocular movements full without nystagmus. Visual fields full to confrontation. Hearing intact. Facial sensation intact.  Very mild left lower facial weakness Motor: Full strength right upper and lower extremity.  LUE: 4+-5/5 and LLE mild hip flexor weakness, difficulty assessing ankle strength due to very limited range of motion Sensory.: intact to touch , pinprick , position and vibratory sensation.  Coordination: Rapid alternating movements equal symmetrically. Finger-to-nose and heel-to-shin performed accurately bilaterally. Gait and Station: Able to stand without assistance or difficulty.  Abnormal gait with steppage gait and mild imbalance greater with turns.  Mild difficulty standing on single leg.  AFO brace on LLE Reflexes: 1+ and symmetric. Toes downgoing.         ASSESSMENT: Jeremy Grant is a 61 y.o. 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  MRI also showed evidence of previous small subacute left frontal and chronic infarcts likely embolic etiology.  Recent shingles which shingles vasculitis consideration but left-sided subacute on chronic infarcts predated his onset of shingles hence unlikely to be caused.  Chronic pancytopenia with low platelet count and  anemia and likely not a good candidate for anticoagulation.  Possible consideration of 30-day cardiac event monitor outpatient - patient declined further work-up.  During CIR admission, evidence of partial seizure activity and initiated Depakote and Keppra for seizure prophylaxis.  Vascular risk factors include HTN, HLD, EtOH use, CAD, systolic and diastolic CHF, nonischemic cardiomyopathy, pancytopenia and prior stroke on imaging. He continues on Keppra 500 mg twice daily and Depakote 500 mg twice daily without recent seizure activity.      PLAN:  Right CR infarct:  Residual deficit: Gait impairment and mild LLE weakness with ankle contracture. Completed PT. Eval by Dr. Letta Pate - botox not warrented as no evidence of tone issue. Continue stretches per Dr. Letta Pate. Order will be placed to DME to be fitted for new AFO brace or possibly fix his current brace as he is at increased fall risk without AFO brace continue aspirin 81 mg daily  and atorvastatin for secondary stroke prevention  Discussed secondary stroke prevention measures and importance of close PCP follow-up for aggressive stroke risk factor management Seizures, poststroke:  Stable without recent seizure.   On Keppra 500 mg twice daily and Depakote 500 mg twice daily for seizure prophylaxis - refills provided  Prior lab work satisfactory - will plan on repeating at follow up visit HTN: BP goal<130/90.  Uncontrolled currently being managed by cardiology/nephrology. Encouraged continued monitoring at home and follow up with cards/nephology as scheduled  HLD: LDL goal<70. On atorvastatin 80 mg daily - reports lab work by PCP satisfactory (unable to view via epic)    Follow up in 6 months or call earlier if needed   CC:  [facility] provider: Dr. Oswaldo Done, Maebelle Munroe, MD    I spent 43 minutes of face-to-face and non-face-to-face time with patient and wife.  This included previsit chart review, lab review, study review, order entry,  electronic health record documentation, patient and wife education and discussion regarding history of stroke including secondary  stroke prevention measures and aggressive stroke risk factor management, residual deficits and possible benefit with botox and further therapy sessions, post stroke seizures and ongoing use of AED, and answered all other questions to patient and wife satisfaction  Frann Rider, AGNP-BC  [facility] [address] [place], [place] [address]  Phone [phone] Fax [phone] Note: This document was prepared with digital dictation and possible smart phrase technology. Any transcriptional errors that result from this process are unintentional.

## 2021-05-25 NOTE — Telephone Encounter (Signed)
At 1:49 Pt's wife left vm stating that PPH:[id number] (KEPPRA) 500 MG tablet Is normally called in for 90 days, this time it was only done for 30 days.  Pt's wife is asking this be corrected.

## 2021-05-25 NOTE — Progress Notes (Signed)
[facility] [address] [place]. [place] [address] [phone]       STROKE FOLLOW UP NOTE  [name] Date of Birth:  03/11/1961 Medical Record Number:  [id number]   Reason for Referral: stroke follow up    CHIEF COMPLAINT:  Chief Complaint  Patient presents with   Follow-up    RM 3 with spouse [name] Pt is well and stable, no new sz or stroke concerns. Mentions numbness on bottom of foot      HPI:   Update 05/25/2021 JM: Patient returns for 39-month stroke and seizure follow-up accompanied by his wife.  Stable from stroke standpoint without new stroke/TIA symptoms.  Residual LLE weakness with LLE foot numbness and occasional cramping - completed PT with benefit. Continued use of cane and occasional use of AFO brace. Denies any recent falls. Eval by Dr. Letta Pate for possible botox - per note review, no evidence of increased tone after working with PT therefore botox not indicated. Remains on tizanidine 4mg  nightly with benefit.  Compliant on aspirin and atorvastatin -denies side effects.  Blood pressure today 172/95, 160/92 on recheck, wife reports this is normal at doctors visits, it was 150s SBP at home this AM, not checking routinely at home. Managed by nephrology. Remains on Keppra and Depakote without any seizure activity.  Denies side effects. No recent lab work by PCP - nephrology does some labs but unsure what labs and unable to view results via epic.  No further concerns at this time.    History provided for reference purposes only Update 11/22/2020 JM: Jeremy Grant returns for 6 month stroke and seizure follow up accompanied by his wife. Stable from stroke standpoint without new stroke/TIA symptoms.  He continues to experience left leg weakness as well as pain, numbness on the outer portion of his foot, cramping/spasm and increased ankle stiffness.  He does keep active around his home but does not do any specific therapy exercises or stretching.   He continues to use AFO brace for ambulation and more recently has had difficulty ensuring that the brace is in place. They are requesting possibility of a new AFO brace. Compliant on aspirin and atorvastatin without associated side effects.  Blood pressure today elevated.  Monitors at home and has been elevated per patient and wife with recent levels 140-160/70-100. He is currently working with cardiology and nephrology for difficulty to control blood pressure.  Continues to work with [facility] in the process of being placed on kidney transplant list. Compliant on Depakote and Keppra tolerating without side effects and denies any recent seizure activity. No further concerns at this time.  Update 04/21/2020 JM: Jeremy Grant returns for 66-month stroke and seizure follow-up accompanied by his wife.  Stable from stroke standpoint with residual left hemiparesis and gait impairment.  Completed PT in September as he made significant improvements but unfortunately suffered a fall at [facility] on 03/31/2020 without head injury.  He did experience bilateral knee pain post fall limiting ambulation and has since noticed sensation of left leg heaviness.  He has not been doing routine exercises as recommended during therapy sessions. C/o gradual worsening of left eye blurred vision previously diagnosed with cataracts.  Denies acute onset, diplopia or visual loss.  Denies new stroke/TIA symptoms.  Remains on aspirin and atorvastatin without bleeding or bruising.  Blood pressure today elevated but monitors at home which has been stable.  Denies seizure activity currently on Depakote 500 mg twice daily and Keppra 500 mg twice daily  without side effects.  Wife is concerned regarding increased depression and anxiety due to residual stroke deficits and difficulty doing activities he previously enjoyed.  Denies prior history of depression or anxiety or family history.  He is not currently on any type of medication management nor is  interested but wife questions use of counseling for possible benefit.  No further concerns at this time.  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  He is accompanied by his wife.  He has been stable since prior visit 6 months ago without new stroke/TIA symptoms or recurrent seizure activity.  Continues to work with outpatient PT for residual stroke deficits of LLE weakness, gait impairment and imbalance.  He endorses ongoing improvement. He ambulates with cane and AFO brace occasionally depending on distance and terrain.  Continues on tizanidine 4 mg nightly with ongoing benefit of spasticity.  He continues on Depakote 500 mg twice daily and Keppra 500 mg twice daily tolerating well.  Continues on aspirin 81 mg daily and atorvastatin 80 mg daily for secondary stroke prevention.  Blood pressure today 154/74.  Continues to follow closely with nephrology history of thrombocytopenia and vasculitis currently in remission.  No concerns at this time.   Update 06/18/2019 JM: Jeremy Grant is a 61 year old male who is being seen today for stroke follow-up accompanied by his wife.  Residual stroke deficits left sided weakness but greatly improving with ongoing participation of physical therapy.  Improvement of prior spasticity with ongoing use of tizanidine nightly managed by physical medicine and rehab Dr. Posey Pronto.  he is now ambulatory with a cane and denies any recent falls.  He continues to use AFO brace when he leaves the house.  After prior visit, discussion with nephrologist  Dr. Hollie Salk regarding possibly decreasing Depakote dosage due to thrombocytopenia.  Depakote dosage decreased and currently on 500 mg twice daily with patient/wife reporting improvement of lab work.  He does have follow-up with nephrologist next week.  He is also continued on Keppra 500 mg twice daily.  Denies any recent seizure activities.  Only possible side  effects includes upper extremity mild tremors which she has not had previously but denies interference with ADLs.  His wife questions possible return to driving.  Continues on aspirin 81 mg daily and atorvastatin 80 mg daily for secondary stroke prevention without side effects.  Blood pressure today initially elevated at 181/88 but on recheck 138/80.  He does monitor at home and does endorse whitecoat syndrome and typically blood pressure at home typically 1 30-1 40/60 to 70s.  Denies new or worsening stroke/TIA symptoms.    Initial visit 03/03/2019 JM: Jeremy Grant is a 61 year old male who is being seen today for hospital follow-up.  Residual deficits of spastic left hemiparesis and continues to work with outpatient PT with reports of improvement.  He is able to ambulate with a cane but will use wheelchair for long distance.  He will occasionally have LLE spasms and continues on tizanidine with benefit.  No reoccurring seizure activity with ongoing use of Depakote 1000 mg twice daily and Keppra 500 mg twice daily.  Tolerating well without side effects.  Continues on aspirin 81 mg with mild bruising but no bleeding.  Continues on atorvastatin 80 mg without myalgias.  Blood pressure today 124/72.  He continues to follow with Dr. Hollie Salk at [facility] with recent visit last week and initiated carvedilol due to continued elevated blood pressures.  He does not routinely monitor at home.  30-day cardiac event ordered but has not been completed at this time.  No further concerns at this time.  Stroke admission 01/12/2019: Jeremy Grant is a 61 y.o. male with history of nonischemic cardiomyopathy, hypertension, combination of systolic and diastolic cardiac dysfunction, congestive heart failure and arthritis presented on 01/12/2019 with L leg weakness.  Stroke work-up showed patchy right corpus callosum infarct with possible watershed secondary to unknown source.  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  MRA showed right ICA stenosis and pericallosal region (questionable emboli).  Carotid Dopplers show bilateral ICA 1 to 39% stenosis in VAs antegrade.  2D echo EF of 55% without cardiac source of embolus identified.  TCD no evidence of PFO.  Given pancytopenia, he is not a good AC candidate therefore TEE/loop not considered at that time.  Recommended 30-day cardiac event monitor outpatient.  LDL 122.  A1c 5.4.  Initiated DAPT for 3 weeks and aspirin alone.  HTN stable.  Initiated atorvastatin 80 mg daily for HLD and secondary stroke prevention.  No evidence or history of DM.  Other stroke risk factors include EtOH use, CAD, systolic and diastolic CHF, nonischemic cardiomyopathy and prior stroke on imaging.  Other active problems include glomerulonephritis, pancytopenia, shingles in May with postherpetic neuralgia now resolved as well as lumbar spinal disease.  Residual deficits of left hemiparesis and discharged to CIR for ongoing therapies on 01/15/2019.  During CIR admission, Plavix discontinued as well as Lovenox due to excessive bruising with bouts of anemia requiring transfusions.  Underwent EGD on 01/31/2019 with no active bleeding but did show nonbleeding distal esophageal ulcer as well as erosive gastropathy and recommended follow-up with GI outpatient.  He also had bouts of seizures with follow-up EEG negative and initiated Depakote 1000 mg twice daily and Keppra 500 mg twice daily.  He was discharged home with recommendation of outpatient therapies on 02/04/2019.       ROS:   14 system review of systems performed and negative with exception of those listed in HPI   PMH:  Past Medical History:  Diagnosis Date   Arthritis    CHF (congestive heart failure) (HCC)    Combined systolic and diastolic cardiac dysfunction     Echo 11/03/2013 EF 12%, grade 3 diastolic dysfunction   Hypertension    NICM (nonischemic cardiomyopathy) ([facility])    a. L/RHC (11/05/13): RA: 3, RV 52/5, PA 49/19 (31), PCWP 10, AO 166/93, PA 67%, Fick CO/CI: 5.71/2.97, Lmain: normal, LAD: large, without signficant dz, first diagonal has 20% dz at ostium, LCx: normal, RCA: 30% stenosis at the bifurcation of PDA and PLOM   Stroke [facility])     PSH:  Past Surgical History:  Procedure Laterality Date   BIOPSY  01/31/2019   Procedure: BIOPSY;  Surgeon: Otis Brace, MD;  Location: [facility];  Service: Gastroenterology;;   ESOPHAGOGASTRODUODENOSCOPY N/A 01/31/2019   Procedure: ESOPHAGOGASTRODUODENOSCOPY (EGD);  Surgeon: Otis Brace, MD;  Location: [facility] ENDOSCOPY;  Service: Gastroenterology;  Laterality: N/A;   IR FLUORO GUIDE CV LINE RIGHT  07/25/2018   IR US GUIDE VASC ACCESS RIGHT  07/25/2018   LEFT AND RIGHT HEART CATHETERIZATION WITH CORONARY ANGIOGRAM N/A 11/05/2013   Procedure: LEFT AND RIGHT HEART CATHETERIZATION WITH CORONARY ANGIOGRAM;  Surgeon: Peter M Martinique, MD;  Location: [facility] CATH LAB;  Service: Cardiovascular;  Laterality:  N/A;   RIGHT HEART CATHETERIZATION N/A 01/14/2014   Procedure: RIGHT HEART CATH;  Surgeon: Larey Dresser, MD;  Location: [facility] CATH LAB;  Service: Cardiovascular;  Laterality: N/A;    Social History:  Social History   Socioeconomic History   Marital status: Married    Spouse name: BRENDA   Number of children: 0   Years of education: 12   Highest education level: 12th grade  Occupational History   Occupation: Medical sales representative  Tobacco Use   Smoking status: Never   Smokeless tobacco: Never  Substance and Sexual Activity   Alcohol use: Yes    Alcohol/week: 3.0 standard drinks    Types: 3 Glasses of wine per week   Drug use: No   Sexual activity: Not Currently    Birth control/protection: None  Other Topics Concern   Not on file  Social History Narrative   Lives with wife at home with dog Abbie.      Social Determinants of Health   Financial Resource Strain: Not on file  Food Insecurity: Not on file  Transportation Needs: Not on file  Physical Activity: Not on file  Stress: Not on file  Social Connections: Not on file  Intimate Partner Violence: Not on file    Family History:  Family History  Problem Relation Age of Onset   Heart attack Father 72   Heart disease Father    Arrhythmia Father    Hypertension Brother    Hypertension Brother     Medications:   Current Outpatient Medications on File Prior to Visit  Medication Sig Dispense Refill   acetaminophen (TYLENOL) 325 MG tablet Take 2 tablets (650 mg total) by mouth every 6 (six) hours as needed for moderate pain.     amLODipine (NORVASC) 10 MG tablet Take 1 tablet (10 mg total) by mouth daily. 30 tablet 1   aspirin EC 81 MG EC tablet Take 1 tablet (81 mg total) by mouth daily.     atorvastatin (LIPITOR) 80 MG tablet Take 1 tablet (80 mg total) by mouth daily at 6 PM. 30 tablet 0   azaTHIOprine (IMURAN) 50 MG tablet Take 1 tablet (50 mg total) by mouth daily. 30 tablet 1   carvedilol (COREG) 12.5 MG tablet Take 12.5 mg by mouth 2 (two) times daily with a meal.     divalproex (DEPAKOTE ER) 500 MG 24 hr tablet TAKE 1 TABLET BY MOUTH IN THE MORNING AND 1 AT BEDTIME 180 tablet 3   doxazosin (CARDURA) 2 MG tablet 4 mg 2 (two) times daily. Increased to 4 mg 2x/day per pt conversation with nephrologist     levETIRAcetam (KEPPRA) 500 MG tablet Take 1 tablet (500 mg total) by mouth 2 (two) times daily. 180 tablet 3   pantoprazole (PROTONIX) 40 MG tablet Take 1 tablet (40 mg total) by mouth daily. 30 tablet 0   predniSONE (DELTASONE) 5 MG tablet Take 5 mg by mouth daily with breakfast.     tiZANidine (ZANAFLEX) 4 MG tablet Take 1 tablet (4 mg total) by mouth every 8 (eight) hours as needed for muscle spasms. (Patient taking differently: Take 4 mg by mouth every 8 (eight) hours as needed for muscle spasms. 2 pills at night) 90 tablet  1   No current facility-administered medications on file prior to visit.    Allergies:   Allergies  Allergen Reactions   Hydralazine Hcl      Physical Exam  Vitals:   05/25/21 0836  BP: (!) 172/95  Pulse: 65  Weight: 170 lb (77.1 kg)  Height: 6' (1.829 m)   Body mass index is 23.06 kg/m. No results found.  General: well developed, well nourished, pleasant middle-age Caucasian male, seated, in no evident distress Head: head normocephalic and atraumatic.   Neck: supple with no carotid or supraclavicular bruits Cardiovascular: regular rate and rhythm, no murmurs Musculoskeletal: no deformity Skin:  no rash/petichiae Vascular:  Normal pulses all extremities   Neurologic Exam Mental Status: Awake and fully alert. Normal speech and language. Oriented to place and time. Recent and remote memory intact. Attention span, concentration and fund of knowledge appropriate. Mood and affect appropriate.  Cranial Nerves: Pupils equal, briskly reactive to light. Extraocular movements full without nystagmus. Visual fields full to confrontation. Hearing intact. Facial sensation intact.  Very mild left lower facial weakness Motor: Full strength right upper and lower extremity.  LUE: 4/5 and LLE mild hip flexor weakness and trace ADF, unable to appreciate residual tone or spasticity LLE Sensory.: intact to touch , pinprick , position and vibratory sensation.  Coordination: Rapid alternating movements equal symmetrically. Finger-to-nose and heel-to-shin performed accurately bilaterally. Gait and Station: Able to stand without assistance or difficulty.  Abnormal gait with steppage gait and mild imbalance greater with turns carrying cane.  Mild difficulty standing on single leg.  Unable to complete tandem walk or heel toe Reflexes: 1+ and symmetric. Toes downgoing.         ASSESSMENT: Jeremy Grant is a 61 y.o. year old male presented with left leg weakness on 01/12/2019 with stroke work-up  revealing right corpus callosum infarct secondary to unknown source.  MRI also showed evidence of previous small subacute left frontal and chronic infarcts likely embolic etiology.  Recent shingles which shingles vasculitis consideration but left-sided subacute on chronic infarcts predated his onset of shingles hence unlikely to be caused.  Chronic pancytopenia with low platelet count and anemia and likely not a good candidate for anticoagulation.  Possible consideration of 30-day cardiac event monitor outpatient - patient declined further work-up.  During CIR admission, evidence of partial seizure activity and initiated Depakote and Keppra for seizure prophylaxis.  Vascular risk factors include HTN, HLD, EtOH use, CAD, systolic and diastolic CHF, nonischemic cardiomyopathy, pancytopenia and prior stroke on imaging. He continues on Keppra 500 mg twice daily and Depakote 500 mg twice daily without recent seizure activity.      PLAN:  Right CR infarct:  Residual deficit: Gait impairment and mild left hemiparesis. Completed PT. Eval by Dr. Letta Pate - botox not warrented as no evidence of tone issue after PT. Continue stretches per Dr. Letta Pate.  Continue tizanidine 4 mg nightly per PMR continue aspirin 81 mg daily  and atorvastatin for secondary stroke prevention  Discussed secondary stroke prevention measures and importance of close PCP follow-up for aggressive stroke risk factor management Seizures, poststroke:  Stable without recent seizure.   On Keppra 500 mg twice daily and Depakote 500 mg twice daily for seizure prophylaxis - refills provided  Will obtain lab work HTN: BP goal<130/90.  Uncontrolled currently being managed by cardiology/nephrology. Encouraged continued monitoring at home and follow up with cards/nephology as scheduled  HLD: LDL goal<70. On atorvastatin 80 mg daily - repeat lipid panel today. Request future monitoring/management per PCP    Follow up in 1 year or call earlier if  needed   CC:  Hayden Rasmussen, MD    I spent 34 minutes of face-to-face and non-face-to-face time with patient and wife.  This included  previsit chart review, lab review, study review, order entry, electronic health record documentation, patient and wife education and discussion regarding history of stroke including secondary stroke prevention measures and aggressive stroke risk factor management, residual deficits, post stroke seizures and ongoing use of AED, and answered all other questions to patient and wife satisfaction  Frann Rider, AGNP-BC  [facility] [address] [place], [place] [address]  Phone [phone] Fax [phone] Note: This document was prepared with digital dictation and possible smart phrase technology. Any transcriptional errors that result from this process are unintentional.

## 2021-05-25 NOTE — Patient Instructions (Signed)
Continue aspirin 81 mg daily  and atorvastatin  for secondary stroke prevention  Continue Keppra and Depakote for seizure prevention; we will check lab work today  Continue to follow up with PCP regarding cholesterol and blood pressure management  Maintain strict control of hypertension with blood pressure goal below 130/90, diabetes with hemoglobin A1c goal below 7.0 % and cholesterol with LDL cholesterol (bad cholesterol) goal below 70 mg/dL.   Signs of a Stroke? Follow the BEFAST method:  Balance Watch for a sudden loss of balance, trouble with coordination or vertigo Eyes Is there a sudden loss of vision in one or both eyes? Or double vision?  Face: Ask the person to smile. Does one side of the face droop or is it numb?  Arms: Ask the person to raise both arms. Does one arm drift downward? Is there weakness or numbness of a leg? Speech: Ask the person to repeat a simple phrase. Does the speech sound slurred/strange? Is the person confused ? Time: If you observe any of these signs, call 911.     Followup in the future with me in 1 year or call earlier if needed       Thank you for coming to see [place] at [facility]. I hope we have been able to provide you high quality care today.  You may receive a patient satisfaction survey over the next few weeks. We would appreciate your feedback and comments so that we may continue to improve ourselves and the health of our patients.

## 2021-05-26 LAB — HEPATIC FUNCTION PANEL
ALT: 16 IU/L (ref 0–44)
AST: 17 IU/L (ref 0–40)
Albumin: 4.9 g/dL — ABNORMAL HIGH (ref 3.8–4.8)
Alkaline Phosphatase: 54 IU/L (ref 44–121)
Bilirubin Total: 0.4 mg/dL (ref 0.0–1.2)
Bilirubin, Direct: 0.14 mg/dL (ref 0.00–0.40)
Total Protein: 7 g/dL (ref 6.0–8.5)

## 2021-05-26 LAB — CBC WITH DIFFERENTIAL/PLATELET
Basophils Absolute: 0 10*3/uL (ref 0.0–0.2)
Basos: 0 %
EOS (ABSOLUTE): 0 10*3/uL (ref 0.0–0.4)
Eos: 0 %
Hematocrit: 39.6 % (ref 37.5–51.0)
Hemoglobin: 13.3 g/dL (ref 13.0–17.7)
Immature Grans (Abs): 0.1 10*3/uL (ref 0.0–0.1)
Immature Granulocytes: 1 %
Lymphocytes Absolute: 0.3 10*3/uL — ABNORMAL LOW (ref 0.7–3.1)
Lymphs: 5 %
MCH: 32 pg (ref 26.6–33.0)
MCHC: 33.6 g/dL (ref 31.5–35.7)
MCV: 95 fL (ref 79–97)
Monocytes Absolute: 0.3 10*3/uL (ref 0.1–0.9)
Monocytes: 5 %
Neutrophils Absolute: 4.3 10*3/uL (ref 1.4–7.0)
Neutrophils: 89 %
Platelets: 96 10*3/uL — CL (ref 150–450)
RBC: 4.15 x10E6/uL (ref 4.14–5.80)
RDW: 13.7 % (ref 11.6–15.4)
WBC: 4.9 10*3/uL (ref 3.4–10.8)

## 2021-05-26 LAB — LIPID PANEL
Chol/HDL Ratio: 3.2 ratio (ref 0.0–5.0)
Cholesterol, Total: 151 mg/dL (ref 100–199)
HDL: 47 mg/dL (ref >39)
LDL Chol Calc (NIH): 81 mg/dL (ref 0–99)
Triglycerides: 133 mg/dL (ref 0–149)
VLDL Cholesterol Cal: 23 mg/dL (ref 5–40)

## 2021-05-26 LAB — VALPROIC ACID LEVEL: Valproic Acid Lvl: 92 ug/mL (ref 50–100)

## 2021-05-29 ENCOUNTER — Telehealth: Payer: Self-pay | Admitting: *Deleted

## 2021-05-29 ENCOUNTER — Other Ambulatory Visit: Payer: Self-pay | Admitting: Adult Health

## 2021-05-29 ENCOUNTER — Ambulatory Visit: Payer: 59 | Admitting: Adult Health

## 2021-05-29 DIAGNOSIS — I639 Cerebral infarction, unspecified: Secondary | ICD-10-CM

## 2021-05-29 DIAGNOSIS — T50905A Adverse effect of unspecified drugs, medicaments and biological substances, initial encounter: Secondary | ICD-10-CM

## 2021-05-29 MED ORDER — DIVALPROEX SODIUM ER 250 MG PO TB24: 250.0000 mg | ORAL_TABLET | Freq: Every morning | ORAL | 11 refills | Status: DC

## 2021-05-29 MED ORDER — DIVALPROEX SODIUM ER 500 MG PO TB24: 500.0000 mg | ORAL_TABLET | Freq: Every day | ORAL | 11 refills | Status: DC

## 2021-05-29 NOTE — Telephone Encounter (Signed)
Called and advised patient and wife (on speaker phone)  that recent lab work showed decreased platelet function which can potentially be caused from Depakote. Janett Billow recommends decreasing Depakote dosage from 500mg  BID to 250mg  AM and 500mg  PM. She has sent in new Rxs. She wants repeat labs in 1 month. Advised no appointment needed, lab opens at 8 am, come in before he takes depakote but bring it with him to take after blood draw.  Advised his cholesterol levels showed LDL or bad cholesterol at 81 with a goal of less than 70. Advised he follow diet modification with limiting high cholesterol foods and continued f/u with PCP for monitoring/management. Wife and patient verbalized understanding, appreciation.

## 2021-05-29 NOTE — Addendum Note (Signed)
Addended by: Frann Rider L on: 05/29/2021 10:57 AM   Modules accepted: Orders

## 2021-06-27 ENCOUNTER — Encounter: Payer: Self-pay | Admitting: *Deleted

## 2021-06-29 ENCOUNTER — Other Ambulatory Visit (INDEPENDENT_AMBULATORY_CARE_PROVIDER_SITE_OTHER): Payer: Self-pay

## 2021-06-29 DIAGNOSIS — I639 Cerebral infarction, unspecified: Secondary | ICD-10-CM

## 2021-06-29 DIAGNOSIS — T50905A Adverse effect of unspecified drugs, medicaments and biological substances, initial encounter: Secondary | ICD-10-CM

## 2021-06-29 DIAGNOSIS — D6959 Other secondary thrombocytopenia: Secondary | ICD-10-CM

## 2021-06-29 DIAGNOSIS — R569 Unspecified convulsions: Secondary | ICD-10-CM

## 2021-06-29 DIAGNOSIS — Z0289 Encounter for other administrative examinations: Secondary | ICD-10-CM

## 2021-06-30 LAB — CBC WITH DIFFERENTIAL/PLATELET
Basophils Absolute: 0 10*3/uL (ref 0.0–0.2)
Basos: 1 %
EOS (ABSOLUTE): 0.1 10*3/uL (ref 0.0–0.4)
Eos: 1 %
Hematocrit: 37 % — ABNORMAL LOW (ref 37.5–51.0)
Hemoglobin: 12.8 g/dL — ABNORMAL LOW (ref 13.0–17.7)
Immature Grans (Abs): 0.1 10*3/uL (ref 0.0–0.1)
Immature Granulocytes: 1 %
Lymphocytes Absolute: 0.4 10*3/uL — ABNORMAL LOW (ref 0.7–3.1)
Lymphs: 6 %
MCH: 32.9 pg (ref 26.6–33.0)
MCHC: 34.6 g/dL (ref 31.5–35.7)
MCV: 95 fL (ref 79–97)
Monocytes Absolute: 0.4 10*3/uL (ref 0.1–0.9)
Monocytes: 7 %
Neutrophils Absolute: 4.8 10*3/uL (ref 1.4–7.0)
Neutrophils: 84 %
Platelets: 106 10*3/uL — ABNORMAL LOW (ref 150–450)
RBC: 3.89 x10E6/uL — ABNORMAL LOW (ref 4.14–5.80)
RDW: 13.3 % (ref 11.6–15.4)
WBC: 5.7 10*3/uL (ref 3.4–10.8)

## 2021-06-30 LAB — VALPROIC ACID LEVEL: Valproic Acid Lvl: 55 ug/mL (ref 50–100)

## 2021-12-12 DEATH — deceased

## 2021-12-21 ENCOUNTER — Telehealth: Payer: Self-pay | Admitting: Adult Health

## 2021-12-21 NOTE — Telephone Encounter (Signed)
Are you willing to place referral and pt needs to contact PCP ?

## 2021-12-21 NOTE — Telephone Encounter (Signed)
Typically, patients need a normal psychiatric evaluation, not necessarily neuropsychology.  Transplant team/nephrology should be making this referral.

## 2021-12-21 NOTE — Telephone Encounter (Signed)
Contacted pt spouse back, informed her of Jessica's recommendations. She stated she will contact the social worker back and go from there. Was appreciative for the call.

## 2021-12-21 NOTE — Telephone Encounter (Signed)
Pt wife [name]) is calling and asking if Janett Billow can make a referral for a neurophysiologist. Pt needs a exam so he can get on the Kidney Transplant List. [name] is requesting a call from nurse

## 2022-02-26 ENCOUNTER — Ambulatory Visit: Payer: Medicare Other | Admitting: Psychology
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
## 2022-05-24 NOTE — Progress Notes (Signed)
[facility] [address] [place]. [place] [address] [phone]       STROKE FOLLOW UP NOTE  Mr. Romana Juniper Date of Birth:  1961-03-13 Medical Record Number:  [id number]   Reason for Referral: stroke follow up    CHIEF COMPLAINT:  Chief Complaint  Patient presents with   Follow-up    Rm 2  with spouse [name]  Pt is well and stable, no new sz or stroke concerns since last visit.      HPI:   Update 05/28/2021 JM: Patient returns for yearly stroke and seizure follow-up accompanied by his wife.  Overall stable.  His new stroke/TIA symptoms.  Residual LLE weakness with L foot numbness and occasional cramping, stable. Use of tizanidine '4mg'$  at night with benefit. Use of cane for ambulation outdoors, denies any recent falls.  Remains on aspirin and atorvastatin.  Blood pressure slightly elevated today which is normal at appointments, does not routinely monitor at home.  Self discontinued both Keppra and Depakote since prior visit with gradual slow taper, has been off both medications over the past 2 weeks, denies any recurrent seizure activity. Routinely follows with PCP. Was finally approved for disability spring of 2023.     History provided for reference purposes only Update 05/25/2021 JM: Patient returns for 57-monthstroke and seizure follow-up accompanied by his wife.  Stable from stroke standpoint without new stroke/TIA symptoms.  Residual LLE weakness with LLE foot numbness and occasional cramping - completed PT with benefit. Continued use of cane and occasional use of AFO brace. Denies any recent falls. Eval by Dr. KLetta Patefor possible botox - per note review, no evidence of increased tone after working with PT therefore botox not indicated. Remains on tizanidine '4mg'$  nightly with benefit.  Compliant on aspirin and atorvastatin -denies side effects.  Blood pressure today 172/95, 160/92 on recheck, wife reports this is normal at doctors visits, it was 150s SBP  at home this AM, not checking routinely at home. Managed by nephrology. Remains on Keppra and Depakote without any seizure activity.  Denies side effects. No recent lab work by PCP - nephrology does some labs but unsure what labs and unable to view results via epic.  No further concerns at this time.  Update 11/22/2020 JM: Mr. WShallreturns for 6 month stroke and seizure follow up accompanied by his wife. Stable from stroke standpoint without new stroke/TIA symptoms.  He continues to experience left leg weakness as well as pain, numbness on the outer portion of his foot, cramping/spasm and increased ankle stiffness.  He does keep active around his home but does not do any specific therapy exercises or stretching.  He continues to use AFO brace for ambulation and more recently has had difficulty ensuring that the brace is in place. They are requesting possibility of a new AFO brace. Compliant on aspirin and atorvastatin without associated side effects.  Blood pressure today elevated.  Monitors at home and has been elevated per patient and wife with recent levels 140-160/70-100. He is currently working with cardiology and nephrology for difficulty to control blood pressure.  Continues to work with [place] the process of being placed on kidney transplant list. Compliant on Depakote and Keppra tolerating without side effects and denies any recent seizure activity. No further concerns at this time.  Update 04/21/2020 JM: Mr. WMachreturns for 638-monthtroke and seizure follow-up accompanied by his wife.  Stable from stroke standpoint with residual left hemiparesis and gait impairment.  Completed  PT in September as he made significant improvements but unfortunately suffered a fall at [facility] on 03/31/2020 without head injury.  He did experience bilateral knee pain post fall limiting ambulation and has since noticed sensation of left leg heaviness.  He has not been doing routine exercises as recommended during  therapy sessions. C/o gradual worsening of left eye blurred vision previously diagnosed with cataracts.  Denies acute onset, diplopia or visual loss.  Denies new stroke/TIA symptoms.  Remains on aspirin and atorvastatin without bleeding or bruising.  Blood pressure today elevated but monitors at home which has been stable.  Denies seizure activity currently on Depakote 500 mg twice daily and Keppra 500 mg twice daily without side effects.  Wife is concerned regarding increased depression and anxiety due to residual stroke deficits and difficulty doing activities he previously enjoyed.  Denies prior history of depression or anxiety or family history.  He is not currently on any type of medication management nor is interested but wife questions use of counseling for possible benefit.  No further concerns at this time.  Update 10/19/2019 JM: Mr. 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  He is accompanied by his wife.  He has been stable since prior visit 6 months ago without new stroke/TIA symptoms or recurrent seizure activity.  Continues to work with outpatient PT for residual stroke deficits of LLE weakness, gait impairment and imbalance.  He endorses ongoing improvement. He ambulates with cane and AFO brace occasionally depending on distance and terrain.  Continues on tizanidine 4 mg nightly with ongoing benefit of spasticity.  He continues on Depakote 500 mg twice daily and Keppra 500 mg twice daily tolerating well.  Continues on aspirin 81 mg daily and atorvastatin 80 mg daily for secondary stroke prevention.  Blood pressure today 154/74.  Continues to follow closely with nephrology history of thrombocytopenia and vasculitis currently in remission.  No concerns at this time.   Update 06/18/2019 JM: Mr. Cochrane is a 62 year old male who is being seen today for stroke follow-up accompanied by his wife.  Residual stroke deficits left sided weakness  but greatly improving with ongoing participation of physical therapy.  Improvement of prior spasticity with ongoing use of tizanidine nightly managed by physical medicine and rehab Dr. Posey Pronto.  he is now ambulatory with a cane and denies any recent falls.  He continues to use AFO brace when he leaves the house.  After prior visit, discussion with nephrologist  Dr. Hollie Salk regarding possibly decreasing Depakote dosage due to thrombocytopenia.  Depakote dosage decreased and currently on 500 mg twice daily with patient/wife reporting improvement of lab work.  He does have follow-up with nephrologist next week.  He is also continued on Keppra 500 mg twice daily.  Denies any recent seizure activities.  Only possible side effects includes upper extremity mild tremors which she has not had previously but denies interference with ADLs.  His wife questions possible return to driving.  Continues on aspirin 81 mg daily and atorvastatin 80 mg daily for secondary stroke prevention without side effects.  Blood pressure today initially elevated at 181/88 but on recheck 138/80.  He does monitor at home and does endorse whitecoat syndrome and typically blood pressure at home typically 1 30-1 40/60 to 70s.  Denies new or worsening stroke/TIA symptoms.    Initial visit 03/03/2019 JM: Mr. Dials is a 62 year old male who is being seen today for hospital follow-up.  Residual deficits of spastic left  hemiparesis and continues to work with outpatient PT with reports of improvement.  He is able to ambulate with a cane but will use wheelchair for long distance.  He will occasionally have LLE spasms and continues on tizanidine with benefit.  No reoccurring seizure activity with ongoing use of Depakote 1000 mg twice daily and Keppra 500 mg twice daily.  Tolerating well without side effects.  Continues on aspirin 81 mg with mild bruising but no bleeding.  Continues on atorvastatin 80 mg without myalgias.  Blood pressure today 124/72.  He  continues to follow with Dr. Hollie Salk at [facility] with recent visit last week and initiated carvedilol due to continued elevated blood pressures.  He does not routinely monitor at home.  30-day cardiac event ordered but has not been completed at this time.  No further concerns at this time.  Stroke admission 01/12/2019: Mr. MY MADARIAGA is a 62 y.o. male with history of nonischemic cardiomyopathy, hypertension, combination of systolic and diastolic cardiac dysfunction, congestive heart failure and arthritis presented on 01/12/2019 with L leg weakness.  Stroke work-up showed patchy right corpus callosum infarct with possible watershed secondary to unknown source.  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  MRA showed right ICA stenosis and pericallosal region (questionable emboli).  Carotid Dopplers show bilateral ICA 1 to 39% stenosis in VAs antegrade.  2D echo EF of 55% without cardiac source of embolus identified.  TCD no evidence of PFO.  Given pancytopenia, he is not a good AC candidate therefore TEE/loop not considered at that time.  Recommended 30-day cardiac event monitor outpatient.  LDL 122.  A1c 5.4.  Initiated DAPT for 3 weeks and aspirin alone.  HTN stable.  Initiated atorvastatin 80 mg daily for HLD and secondary stroke prevention.  No evidence or history of DM.  Other stroke risk factors include EtOH use, CAD, systolic and diastolic CHF, nonischemic cardiomyopathy and prior stroke on imaging.  Other active problems include glomerulonephritis, pancytopenia, shingles in May with postherpetic neuralgia now resolved as well as lumbar spinal disease.  Residual deficits of left hemiparesis and discharged to CIR for ongoing therapies on 01/15/2019.  During CIR admission, Plavix discontinued as well as Lovenox due to excessive  bruising with bouts of anemia requiring transfusions.  Underwent EGD on 01/31/2019 with no active bleeding but did show nonbleeding distal esophageal ulcer as well as erosive gastropathy and recommended follow-up with GI outpatient.  He also had bouts of seizures with follow-up EEG negative and initiated Depakote 1000 mg twice daily and Keppra 500 mg twice daily.  He was discharged home with recommendation of outpatient therapies on 02/04/2019.       ROS:   14 system review of systems performed and negative with exception of those listed in HPI   PMH:  Past Medical History:  Diagnosis Date   Arthritis    CHF (congestive heart failure) (HCC)    Combined systolic and diastolic cardiac dysfunction    Echo 11/03/2013 EF 49%, grade 3 diastolic dysfunction   Hypertension    NICM (nonischemic cardiomyopathy) ([place])    a. L/RHC (11/05/13): RA: 3, RV 52/5, PA 49/19 (31), PCWP 10, AO 166/93, PA 67%, Fick CO/CI: 5.71/2.97, Lmain: normal, LAD: large, without signficant dz, first diagonal has 20% dz at ostium, LCx: normal, RCA: 30% stenosis at the bifurcation of PDA and PLOM   Stroke (  [place])     PSH:  Past Surgical History:  Procedure Laterality Date   BIOPSY  01/31/2019   Procedure: BIOPSY;  Surgeon: Otis Brace, MD;  Location: [facility];  Service: Gastroenterology;;   CATARACT EXTRACTION Bilateral 05/2021   ESOPHAGOGASTRODUODENOSCOPY N/A 01/31/2019   Procedure: ESOPHAGOGASTRODUODENOSCOPY (EGD);  Surgeon: Otis Brace, MD;  Location: [facility] ENDOSCOPY;  Service: Gastroenterology;  Laterality: N/A;   IR FLUORO GUIDE CV LINE RIGHT  07/25/2018   IR US GUIDE VASC ACCESS RIGHT  07/25/2018   LEFT AND RIGHT HEART CATHETERIZATION WITH CORONARY ANGIOGRAM N/A 11/05/2013   Procedure: LEFT AND RIGHT HEART CATHETERIZATION WITH CORONARY ANGIOGRAM;  Surgeon: Peter M Martinique, MD;  Location: [facility] CATH LAB;  Service: Cardiovascular;  Laterality: N/A;   RIGHT HEART CATHETERIZATION N/A 01/14/2014   Procedure:  RIGHT HEART CATH;  Surgeon: Larey Dresser, MD;  Location: [facility] CATH LAB;  Service: Cardiovascular;  Laterality: N/A;    Social History:  Social History   Socioeconomic History   Marital status: Married    Spouse name: BRENDA   Number of children: 0   Years of education: 12   Highest education level: 12th grade  Occupational History   Occupation: Medical sales representative  Tobacco Use   Smoking status: Never   Smokeless tobacco: Never  Substance and Sexual Activity   Alcohol use: Yes    Alcohol/week: 3.0 standard drinks of alcohol    Types: 3 Glasses of wine per week   Drug use: No   Sexual activity: Not Currently    Birth control/protection: None  Other Topics Concern   Not on file  Social History Narrative   Lives with wife at home with dog Abbie.     Social Determinants of Health   Financial Resource Strain: Not on file  Food Insecurity: No Food Insecurity (07/17/2018)   Hunger Vital Sign    Worried About Running Out of Food in the Last Year: Never true    Ran Out of Food in the Last Year: Never true  Transportation Needs: No Transportation Needs (07/17/2018)   PRAPARE - Hydrologist (Medical): No    Lack of Transportation (Non-Medical): No  Physical Activity: Sufficiently Active (07/17/2018)   Exercise Vital Sign    Days of Exercise per Week: 5 days    Minutes of Exercise per Session: 30 min  Stress: No Stress Concern Present (07/17/2018)   [place]    Feeling of Stress : Not at all  Social Connections: Somewhat Isolated (07/17/2018)   Social Connection and Isolation Panel [NHANES]    Frequency of Communication with Friends and Family: More than three times a week    Frequency of Social Gatherings with Friends and Family: Twice a week    Attends Religious Services: Never    Marine scientist or Organizations: No    Attends Archivist Meetings: Never    Marital Status:  Married  Human resources officer Violence: Not At Risk (07/17/2018)   Humiliation, Afraid, Rape, and Kick questionnaire    Fear of Current or Ex-Partner: No    Emotionally Abused: No    Physically Abused: No    Sexually Abused: No    Family History:  Family History  Problem Relation Age of Onset   Heart attack Father 34   Heart disease Father    Arrhythmia Father    Hypertension Brother    Hypertension Brother     Medications:   Current  Outpatient Medications on File Prior to Visit  Medication Sig Dispense Refill   acetaminophen (TYLENOL) 325 MG tablet Take 2 tablets (650 mg total) by mouth every 6 (six) hours as needed for moderate pain.     amLODipine (NORVASC) 10 MG tablet Take 1 tablet (10 mg total) by mouth daily. 30 tablet 1   aspirin EC 81 MG EC tablet Take 1 tablet (81 mg total) by mouth daily.     atorvastatin (LIPITOR) 80 MG tablet Take 1 tablet (80 mg total) by mouth daily at 6 PM. 30 tablet 0   azaTHIOprine (IMURAN) 50 MG tablet Take 1 tablet (50 mg total) by mouth daily. 30 tablet 1   carvedilol (COREG) 12.5 MG tablet Take 12.5 mg by mouth 2 (two) times daily with a meal.     divalproex (DEPAKOTE ER) 250 MG 24 hr tablet Take 1 tablet (250 mg total) by mouth every morning. 30 tablet 11   divalproex (DEPAKOTE ER) 500 MG 24 hr tablet Take 1 tablet (500 mg total) by mouth at bedtime. 30 tablet 11   doxazosin (CARDURA) 8 MG tablet 8 mg 2 (two) times daily. Increased to 4 mg 2x/day per pt conversation with nephrologist     levETIRAcetam (KEPPRA) 500 MG tablet Take 1 tablet (500 mg total) by mouth 2 (two) times daily. 180 tablet 3   pantoprazole (PROTONIX) 40 MG tablet Take 1 tablet (40 mg total) by mouth daily. 30 tablet 0   predniSONE (DELTASONE) 5 MG tablet Take 5 mg by mouth daily with breakfast.     No current facility-administered medications on file prior to visit.    Allergies:   Allergies  Allergen Reactions   Hydralazine Hcl      Physical Exam  Vitals:    05/28/22 0854  BP: (!) 160/89  Pulse: 71  Weight: 179 lb (81.2 kg)  Height: 6' (1.829 m)    Body mass index is 24.28 kg/m. No results found.  General: well developed, well nourished, pleasant middle-age Caucasian male, seated, in no evident distress Head: head normocephalic and atraumatic.   Neck: supple with no carotid or supraclavicular bruits Cardiovascular: regular rate and rhythm, no murmurs Musculoskeletal: no deformity Skin:  no rash/petichiae Vascular:  Normal pulses all extremities   Neurologic Exam Mental Status: Awake and fully alert. Normal speech and language. Oriented to place and time. Recent and remote memory intact. Attention span, concentration and fund of knowledge appropriate. Mood and affect appropriate.  Cranial Nerves: Pupils equal, briskly reactive to light. Extraocular movements full without nystagmus. Visual fields full to confrontation. Hearing intact. Facial sensation intact.  Very mild left lower facial weakness Motor: Full strength right upper and lower extremity.  LUE: 4+/5; LLE mild hip flexor weakness and trace ADF Sensory.: intact to touch , pinprick , position and vibratory sensation.  Coordination: Rapid alternating movements equal symmetrically. Finger-to-nose and heel-to-shin performed accurately bilaterally. Gait and Station: Able to stand without assistance or difficulty.  Abnormal gait with steppage gait and mild imbalance greater with turns carrying cane.  Mild difficulty standing on single leg.  Unable to complete tandem walk or heel toe Reflexes: 1+ and symmetric. Toes downgoing.         ASSESSMENT: OTHNIEL MARET is a 62 y.o. year old male with right corpus callosum infarct, subacute left frontal and chronic infarcts on 01/12/2019 secondary to unknown source and partial type seizure likely late effect of stroke.  Did not pursue further cardiac monitoring as patient declined and history of  pancytopenia and anemia and would likely be a  poor candidate for anticoagulation. Vascular risk factors include HTN, HLD, EtOH use, CAD, systolic and diastolic CHF, nonischemic cardiomyopathy, pancytopenia and prior stroke on imaging.    PLAN:  Right CR infarct:  Residual deficit: Gait impairment and mild left hemiparesis. Continue tizanidine 4 mg nightly, refill provided, request ongoing refills by PCP continue aspirin 81 mg daily  and atorvastatin for secondary stroke prevention  Discussed secondary stroke prevention measures and importance of close PCP follow-up for aggressive stroke risk factor management including BP goal<130/90, and HLD with LDL goal<70  Seizures, poststroke:  Self d/c'd keppra and depakote after gradual slow taper, has been off for past 2 weeks, no additional seizure activity Recommend completion of EEG to evaluate for any subclinical seizures now off ASM's Complete CBC with diff and CMP Advised no driving for 6 months after discontinuing ASM per [place] law Advised to call with any seizure activity, if seizures should reoccur will need to restart ASM and will recommend lifelong use at that time    Can follow up as needed at this time    CC:  Hayden Rasmussen, MD    I spent 31 minutes of face-to-face and non-face-to-face time with patient and wife.  This included previsit chart review, lab review, study review, order entry, electronic health record documentation, patient and wife education and discussion regarding above diagnoses and treatment plan and answered all the questions to patient and wife satisfaction  Frann Rider, AGNP-BC  [facility] [address] [place], [place] [address]  Phone [phone] Fax [phone] Note: This document was prepared with digital dictation and possible smart phrase technology. Any transcriptional errors that result from this process are unintentional.

## 2022-05-28 ENCOUNTER — Ambulatory Visit: Payer: Medicare Other | Admitting: Adult Health

## 2022-05-28 ENCOUNTER — Encounter: Payer: Self-pay | Admitting: Adult Health

## 2022-05-28 VITALS — BP 160/89 | HR 71 | Ht 72.0 in | Wt 159.8 lb

## 2022-05-28 DIAGNOSIS — I639 Cerebral infarction, unspecified: Secondary | ICD-10-CM | POA: Diagnosis not present

## 2022-05-28 DIAGNOSIS — Z5181 Encounter for therapeutic drug level monitoring: Secondary | ICD-10-CM

## 2022-05-28 DIAGNOSIS — R569 Unspecified convulsions: Secondary | ICD-10-CM | POA: Diagnosis not present

## 2022-05-28 DIAGNOSIS — Z8673 Personal history of transient ischemic attack (TIA), and cerebral infarction without residual deficits: Secondary | ICD-10-CM | POA: Diagnosis not present

## 2022-05-28 MED ORDER — TIZANIDINE HCL 4 MG PO TABS: 4.0000 mg | ORAL_TABLET | Freq: Three times a day (TID) | ORAL | 3 refills | Status: AC | PRN

## 2022-05-28 NOTE — Patient Instructions (Addendum)
You will be scheduled to repeat EEG to ensure no underlying seizures since stopping your medications   Will check lab work today  Please call with any seizure activity   Continue aspirin 81 mg daily  and atorvastatin for secondary stroke prevention  Continue tizanidine '4mg'$  nightly for spasticity   Continue to follow up with PCP regarding blood pressure and cholesterol management  Maintain strict control of hypertension with blood pressure goal below 130/90 and cholesterol with LDL cholesterol (bad cholesterol) goal below 70 mg/dL.   Signs of a Stroke? Follow the BEFAST method:  Balance Watch for a sudden loss of balance, trouble with coordination or vertigo Eyes Is there a sudden loss of vision in one or both eyes? Or double vision?  Face: Ask the person to smile. Does one side of the face droop or is it numb?  Arms: Ask the person to raise both arms. Does one arm drift downward? Is there weakness or numbness of a leg? Speech: Ask the person to repeat a simple phrase. Does the speech sound slurred/strange? Is the person confused ? Time: If you observe any of these signs, call 911.       Thank you for coming to see [place] at [facility]. I hope we have been able to provide you high quality care today.  You may receive a patient satisfaction survey over the next few weeks. We would appreciate your feedback and comments so that we may continue to improve ourselves and the health of our patients.

## 2022-05-29 LAB — COMPREHENSIVE METABOLIC PANEL
ALT: 23 IU/L (ref 0–44)
AST: 19 IU/L (ref 0–40)
Albumin/Globulin Ratio: 2.1 (ref 1.2–2.2)
Albumin: 4.6 g/dL (ref 3.9–4.9)
Alkaline Phosphatase: 67 IU/L (ref 44–121)
BUN/Creatinine Ratio: 18 (ref 10–24)
BUN: 70 mg/dL — ABNORMAL HIGH (ref 8–27)
Bilirubin Total: 0.4 mg/dL (ref 0.0–1.2)
CO2: 22 mmol/L (ref 20–29)
Calcium: 9.3 mg/dL (ref 8.6–10.2)
Chloride: 102 mmol/L (ref 96–106)
Creatinine, Ser: 3.97 mg/dL — ABNORMAL HIGH (ref 0.76–1.27)
Globulin, Total: 2.2 g/dL (ref 1.5–4.5)
Glucose: 222 mg/dL — ABNORMAL HIGH (ref 70–99)
Potassium: 4.2 mmol/L (ref 3.5–5.2)
Sodium: 139 mmol/L (ref 134–144)
Total Protein: 6.8 g/dL (ref 6.0–8.5)
eGFR: 16 mL/min/{1.73_m2} — ABNORMAL LOW (ref >59)

## 2022-05-29 LAB — CBC WITH DIFFERENTIAL/PLATELET
Basophils Absolute: 0 10*3/uL (ref 0.0–0.2)
Basos: 0 %
EOS (ABSOLUTE): 0 10*3/uL (ref 0.0–0.4)
Eos: 1 %
Hematocrit: 39.9 % (ref 37.5–51.0)
Hemoglobin: 13.2 g/dL (ref 13.0–17.7)
Immature Grans (Abs): 0 10*3/uL (ref 0.0–0.1)
Immature Granulocytes: 1 %
Lymphocytes Absolute: 0.3 10*3/uL — ABNORMAL LOW (ref 0.7–3.1)
Lymphs: 4 %
MCH: 31.1 pg (ref 26.6–33.0)
MCHC: 33.1 g/dL (ref 31.5–35.7)
MCV: 94 fL (ref 79–97)
Monocytes Absolute: 0.4 10*3/uL (ref 0.1–0.9)
Monocytes: 4 %
Neutrophils Absolute: 7.8 10*3/uL — ABNORMAL HIGH (ref 1.4–7.0)
Neutrophils: 90 %
Platelets: 173 10*3/uL (ref 150–450)
RBC: 4.24 x10E6/uL (ref 4.14–5.80)
RDW: 12.5 % (ref 11.6–15.4)
WBC: 8.5 10*3/uL (ref 3.4–10.8)

## 2022-06-14 ENCOUNTER — Ambulatory Visit: Payer: Medicare Other | Admitting: Neurology

## 2022-06-14 DIAGNOSIS — R569 Unspecified convulsions: Secondary | ICD-10-CM | POA: Diagnosis not present

## 2022-08-22 ENCOUNTER — Other Ambulatory Visit: Payer: Self-pay | Admitting: Nephrology

## 2022-08-22 DIAGNOSIS — N184 Chronic kidney disease, stage 4 (severe): Secondary | ICD-10-CM

## 2022-08-28 ENCOUNTER — Ambulatory Visit
Admission: RE | Admit: 2022-08-28 | Discharge: 2022-08-28 | Disposition: A | Discharge status: Home / Self Care | Payer: Medicare Other | Source: Ambulatory Visit | Attending: Nephrology | Admitting: Nephrology

## 2022-08-28 DIAGNOSIS — N184 Chronic kidney disease, stage 4 (severe): Secondary | ICD-10-CM

## 2022-09-04 ENCOUNTER — Other Ambulatory Visit (HOSPITAL_COMMUNITY): Payer: Self-pay | Admitting: Nephrology

## 2022-09-04 DIAGNOSIS — N184 Chronic kidney disease, stage 4 (severe): Secondary | ICD-10-CM

## 2022-09-14 ENCOUNTER — Other Ambulatory Visit: Payer: Self-pay | Admitting: Student

## 2022-09-14 ENCOUNTER — Other Ambulatory Visit: Payer: Self-pay | Admitting: Physician Assistant

## 2022-09-14 ENCOUNTER — Other Ambulatory Visit: Payer: Self-pay | Admitting: Internal Medicine

## 2022-09-14 DIAGNOSIS — N179 Acute kidney failure, unspecified: Secondary | ICD-10-CM

## 2022-09-15 ENCOUNTER — Encounter (HOSPITAL_COMMUNITY): Payer: Self-pay

## 2022-09-15 NOTE — H&P (Signed)
Chief Complaint: Patient presents for random renal biopsy.   Referring Physician(s): Upton,Elizabeth  Supervising Physician: {Supervising Physician:[id number]}  Patient Status: [facility] - Out-pt  History of Present Illness: Jeremy Grant is a 62 y.o. male  outpatient. History of CVA ( residual LLE weakness) , HTN, CHF, cardiomyopathy CKD, obstructive uropathy. Team is requesting a kidney biopsy for further evaluation.  Past Medical History:  Diagnosis Date   Arthritis    CHF (congestive heart failure) (HCC)    Combined systolic and diastolic cardiac dysfunction    Echo 11/03/2013 EF 20%, grade 3 diastolic dysfunction   Hypertension    NICM (nonischemic cardiomyopathy) (HCC)    a. L/RHC (11/05/13): RA: 3, RV 52/5, PA 49/19 (31), PCWP 10, AO 166/93, PA 67%, Fick CO/CI: 5.71/2.97, Lmain: normal, LAD: large, without signficant dz, first diagonal has 20% dz at ostium, LCx: normal, RCA: 30% stenosis at the bifurcation of PDA and PLOM   Stroke [facility])     Past Surgical History:  Procedure Laterality Date   BIOPSY  01/31/2019   Procedure: BIOPSY;  Surgeon: Kathi Der, MD;  Location: [facility] ENDOSCOPY;  Service: Gastroenterology;;   CATARACT EXTRACTION Bilateral 05/2021   ESOPHAGOGASTRODUODENOSCOPY N/A 01/31/2019   Procedure: ESOPHAGOGASTRODUODENOSCOPY (EGD);  Surgeon: Kathi Der, MD;  Location: [facility] ENDOSCOPY;  Service: Gastroenterology;  Laterality: N/A;   IR FLUORO GUIDE CV LINE RIGHT  07/25/2018   IR US GUIDE VASC ACCESS RIGHT  07/25/2018   LEFT AND RIGHT HEART CATHETERIZATION WITH CORONARY ANGIOGRAM N/A 11/05/2013   Procedure: LEFT AND RIGHT HEART CATHETERIZATION WITH CORONARY ANGIOGRAM;  Surgeon: Peter M Swaziland, MD;  Location: [facility] CATH LAB;  Service: Cardiovascular;  Laterality: N/A;   RIGHT HEART CATHETERIZATION N/A 01/14/2014   Procedure: RIGHT HEART CATH;  Surgeon: Laurey Morale, MD;  Location: [facility] CATH LAB;  Service: Cardiovascular;  Laterality: N/A;     Allergies: Hydralazine hcl  Medications: Prior to Admission medications   Medication Sig Start Date End Date Taking? Authorizing Provider  acetaminophen (TYLENOL) 325 MG tablet Take 2 tablets (650 mg total) by mouth every 6 (six) hours as needed for moderate pain. 02/03/19   Angiulli, Mcarthur Rossetti, PA-C  amLODipine (NORVASC) 10 MG tablet Take 1 tablet (10 mg total) by mouth daily. 02/04/19   Angiulli, Mcarthur Rossetti, PA-C  aspirin EC 81 MG EC tablet Take 1 tablet (81 mg total) by mouth daily. 02/04/19   Angiulli, Mcarthur Rossetti, PA-C  atorvastatin (LIPITOR) 80 MG tablet Take 1 tablet (80 mg total) by mouth daily at 6 PM. 02/17/19 05/28/22  Jones Bales, NP  azaTHIOprine (IMURAN) 50 MG tablet Take 1 tablet (50 mg total) by mouth daily. 02/04/19   Angiulli, Mcarthur Rossetti, PA-C  carvedilol (COREG) 12.5 MG tablet Take 12.5 mg by mouth 2 (two) times daily with a meal.    [provider]  doxazosin (CARDURA) 8 MG tablet 8 mg 2 (two) times daily. Increased to 4 mg 2x/day per pt conversation with nephrologist    [provider]  pantoprazole (PROTONIX) 40 MG tablet Take 1 tablet (40 mg total) by mouth daily. 02/17/19   Jones Bales, NP  predniSONE (DELTASONE) 5 MG tablet Take 5 mg by mouth daily with breakfast.    [provider]  tiZANidine (ZANAFLEX) 4 MG tablet Take 1 tablet (4 mg total) by mouth every 8 (eight) hours as needed for muscle spasms. 05/28/22   Ihor Austin, NP     Family History  Problem Relation Age of Onset   Heart  attack Father 79   Heart disease Father    Arrhythmia Father    Hypertension Brother    Hypertension Brother     Social History   Socioeconomic History   Marital status: Married    Spouse name: BRENDA   Number of children: 0   Years of education: 12   Highest education level: 12th grade  Occupational History   Occupation: Cabin crew  Tobacco Use   Smoking status: Never   Smokeless tobacco: Never  Substance and Sexual Activity   Alcohol  use: Yes    Alcohol/week: 3.0 standard drinks of alcohol    Types: 3 Glasses of wine per week   Drug use: No   Sexual activity: Not Currently    Birth control/protection: None  Other Topics Concern   Not on file  Social History Narrative   Lives with wife at home with dog Abbie.     Social Determinants of Health   Financial Resource Strain: Not on file  Food Insecurity: No Food Insecurity (07/17/2018)   Hunger Vital Sign    Worried About Running Out of Food in the Last Year: Never true    Ran Out of Food in the Last Year: Never true  Transportation Needs: No Transportation Needs (07/17/2018)   PRAPARE - Administrator, Civil Service (Medical): No    Lack of Transportation (Non-Medical): No  Physical Activity: Sufficiently Active (07/17/2018)   Exercise Vital Sign    Days of Exercise per Week: 5 days    Minutes of Exercise per Session: 30 min  Stress: No Stress Concern Present (07/17/2018)   [employer] of Occupational Health - Occupational Stress Questionnaire    Feeling of Stress : Not at all  Social Connections: Somewhat Isolated (07/17/2018)   Social Connection and Isolation Panel [NHANES]    Frequency of Communication with Friends and Family: More than three times a week    Frequency of Social Gatherings with Friends and Family: Twice a week    Attends Religious Services: Never    Database administrator or Organizations: No    Attends Banker Meetings: Never    Marital Status: Married    ECOG Status: {CHL ONC ECOG ZO:[id number]}  Review of Systems: A 12 point ROS discussed and pertinent positives are indicated in the HPI above.  All other systems are negative.  Review of Systems  Vital Signs: There were no vitals taken for this visit.    Physical Exam  Imaging: US RENAL  Result Date: 08/28/2022 CLINICAL DATA:  CKD. ??? OBSTRUCTIVE UROPATHY EXAM: RENAL / URINARY TRACT ULTRASOUND COMPLETE COMPARISON:  Sep 12, 2020 FINDINGS: Right Kidney:  Renal measurements: 10.4 x 4.2 x 4.9 cm = volume: 112 mL. Echogenicity is increased. No mass or hydronephrosis visualized. Left Kidney: Renal measurements: 10.7 x 4.7 x 5.0 cm = volume: 132 mL. Echogenicity is increased. No suspicious mass or hydronephrosis visualized. Bladder: Appears normal for degree of bladder distention. Other: None. IMPRESSION: 1. No hydronephrosis. 2. Increased echogenicity of the bilateral kidneys as can be seen in medical renal disease. Electronically Signed   By: Meda Klinefelter M.D.   On: 08/28/2022 16:45    Labs:  CBC: Recent Labs    05/28/22 0916  WBC 8.5  HGB 13.2  HCT 39.9  PLT 173    COAGS: No results for input(s): "INR", "APTT" in the last 8760 hours.  BMP: Recent Labs    05/28/22 0916  NA 139  K 4.2  CL  102  CO2 22  GLUCOSE 222*  BUN 70*  CALCIUM 9.3  CREATININE 3.97*    LIVER FUNCTION TESTS: Recent Labs    05/28/22 0916  BILITOT 0.4  AST 19  ALT 23  ALKPHOS 67  PROT 6.8  ALBUMIN 4.6     Assessment and Plan:  62 y.o. male  outpatient. History of CVA ( residual LLE weakness) , HTN, CHF, cardiomyopathy CKD, obstructive uropathy. Team is requesting a kidney biopsy for further evaluation.   US renal from 4.16.24 reads No hydronephrosis. Increased echogenicity of the bilateral kidneys as can be seen in medical renal disease. Patient is on 81 mg of ASA. *** All labs are within acceptabel paraemters. Patient has been NPO since midnight  Risks and benefits of random renal biopsy was discussed with the patient and/or patient's family including, but not limited to bleeding, infection, damage to adjacent structures or low yield requiring additional tests.  All of the questions were answered and there is agreement to proceed.  Consent signed and in chart.    Thank you for this interesting consult.  I greatly enjoyed meeting Jeremy Grant and look forward to participating in their care.  A copy of this report was sent to the  requesting provider on this date.  Electronically Signed: Alene Mires, NP 09/15/2022, 11:00 AM   I spent a total of {New NWGN:[id number]} {New Out-Pt:[id number]}  {Established Out-Pt:[id number]} in face to face in clinical consultation, greater than 50% of which was counseling/coordinating care for ***

## 2022-09-17 ENCOUNTER — Other Ambulatory Visit: Payer: Self-pay

## 2022-09-17 ENCOUNTER — Ambulatory Visit (HOSPITAL_COMMUNITY)
Admission: RE | Admit: 2022-09-17 | Discharge: 2022-09-17 | Disposition: A | Discharge status: Home / Self Care | Payer: Medicare Other | Source: Ambulatory Visit | Attending: Nephrology | Admitting: Nephrology

## 2022-09-17 DIAGNOSIS — I129 Hypertensive chronic kidney disease with stage 1 through stage 4 chronic kidney disease, or unspecified chronic kidney disease: Secondary | ICD-10-CM | POA: Diagnosis not present

## 2022-09-17 DIAGNOSIS — I428 Other cardiomyopathies: Secondary | ICD-10-CM | POA: Insufficient documentation

## 2022-09-17 DIAGNOSIS — I69354 Hemiplegia and hemiparesis following cerebral infarction affecting left non-dominant side: Secondary | ICD-10-CM | POA: Diagnosis not present

## 2022-09-17 DIAGNOSIS — I509 Heart failure, unspecified: Secondary | ICD-10-CM | POA: Diagnosis not present

## 2022-09-17 DIAGNOSIS — I7782 Antineutrophilic cytoplasmic antibody (ANCA) vasculitis: Secondary | ICD-10-CM | POA: Insufficient documentation

## 2022-09-17 DIAGNOSIS — Z8249 Family history of ischemic heart disease and other diseases of the circulatory system: Secondary | ICD-10-CM | POA: Diagnosis not present

## 2022-09-17 DIAGNOSIS — N179 Acute kidney failure, unspecified: Secondary | ICD-10-CM

## 2022-09-17 DIAGNOSIS — N189 Chronic kidney disease, unspecified: Secondary | ICD-10-CM | POA: Diagnosis present

## 2022-09-17 DIAGNOSIS — N139 Obstructive and reflux uropathy, unspecified: Secondary | ICD-10-CM | POA: Insufficient documentation

## 2022-09-17 DIAGNOSIS — N184 Chronic kidney disease, stage 4 (severe): Secondary | ICD-10-CM

## 2022-09-17 LAB — CBC WITH DIFFERENTIAL/PLATELET
Abs Immature Granulocytes: 0.08 10*3/uL — ABNORMAL HIGH (ref 0.00–0.07)
Basophils Absolute: 0 10*3/uL (ref 0.0–0.1)
Basophils Relative: 0 %
Eosinophils Absolute: 0 10*3/uL (ref 0.0–0.5)
Eosinophils Relative: 0 %
HCT: 38 % — ABNORMAL LOW (ref 39.0–52.0)
Hemoglobin: 13.8 g/dL (ref 13.0–17.0)
Immature Granulocytes: 1 %
Lymphocytes Relative: 7 %
Lymphs Abs: 0.4 10*3/uL — ABNORMAL LOW (ref 0.7–4.0)
MCH: 31.4 pg (ref 26.0–34.0)
MCHC: 36.3 g/dL — ABNORMAL HIGH (ref 30.0–36.0)
MCV: 86.6 fL (ref 80.0–100.0)
Monocytes Absolute: 0.5 10*3/uL (ref 0.1–1.0)
Monocytes Relative: 8 %
Neutro Abs: 4.9 10*3/uL (ref 1.7–7.7)
Neutrophils Relative %: 84 %
Platelets: 96 10*3/uL — ABNORMAL LOW (ref 150–400)
RBC: 4.39 MIL/uL (ref 4.22–5.81)
RDW: 12.6 % (ref 11.5–15.5)
WBC: 5.8 10*3/uL (ref 4.0–10.5)
nRBC: 0 % (ref 0.0–0.2)

## 2022-09-17 LAB — BASIC METABOLIC PANEL
Anion gap: 11 (ref 5–15)
BUN: 103 mg/dL — ABNORMAL HIGH (ref 8–23)
CO2: 22 mmol/L (ref 22–32)
Calcium: 8.8 mg/dL — ABNORMAL LOW (ref 8.9–10.3)
Chloride: 104 mmol/L (ref 98–111)
Creatinine, Ser: 3.7 mg/dL — ABNORMAL HIGH (ref 0.61–1.24)
GFR, Estimated: 18 mL/min — ABNORMAL LOW (ref >60)
Glucose, Bld: 177 mg/dL — ABNORMAL HIGH (ref 70–99)
Potassium: 3.7 mmol/L (ref 3.5–5.1)
Sodium: 137 mmol/L (ref 135–145)

## 2022-09-17 LAB — PROTIME-INR
INR: 0.9 (ref 0.8–1.2)
Prothrombin Time: 12.3 seconds (ref 11.4–15.2)

## 2022-09-17 MED ORDER — MIDAZOLAM HCL 2 MG/2ML IJ SOLN
INTRAMUSCULAR | Status: AC | PRN
  Administered 2022-09-17: 1 mg via INTRAVENOUS

## 2022-09-17 MED ORDER — GELATIN ABSORBABLE 12-7 MM EX MISC
1.0000 | Freq: Once | CUTANEOUS | Status: AC
  Administered 2022-09-17: 1 via TOPICAL

## 2022-09-17 MED ORDER — SODIUM CHLORIDE 0.9 % IV SOLN: INTRAVENOUS | Status: DC

## 2022-09-17 MED ORDER — FENTANYL CITRATE (PF) 100 MCG/2ML IJ SOLN
INTRAMUSCULAR | Status: AC
  Filled 2022-09-17: qty 2

## 2022-09-17 MED ORDER — MIDAZOLAM HCL 2 MG/2ML IJ SOLN
INTRAMUSCULAR | Status: AC
  Filled 2022-09-17: qty 2

## 2022-09-17 MED ORDER — FENTANYL CITRATE (PF) 100 MCG/2ML IJ SOLN
INTRAMUSCULAR | Status: AC | PRN
  Administered 2022-09-17: 50 ug via INTRAVENOUS

## 2022-09-17 MED ORDER — LIDOCAINE HCL (PF) 1 % IJ SOLN
6.0000 mL | Freq: Once | INTRAMUSCULAR | Status: AC
  Administered 2022-09-17: 6 mL via INTRADERMAL

## 2022-09-17 NOTE — Procedures (Signed)
Interventional Radiology Procedure Note  Procedure: US guided non targeted core biopsy of the lower pole of the RIGHT kidney.  Complications: None immediate  Estimated Blood Loss: None  Recommendations: - Bedrest x 4 hrs - DC home   Signed,  Sterling Big, MD

## 2022-09-19 ENCOUNTER — Encounter (HOSPITAL_COMMUNITY): Payer: Self-pay

## 2022-09-19 LAB — SURGICAL PATHOLOGY

## 2023-05-04 ENCOUNTER — Emergency Department (HOSPITAL_COMMUNITY): Payer: Medicare Other

## 2023-05-04 ENCOUNTER — Inpatient Hospital Stay (HOSPITAL_COMMUNITY)
Admission: EM | Admit: 2023-05-04 | Discharge: 2023-05-10 | DRG: 577 | Disposition: A | Discharge status: Home / Self Care | Payer: Medicare Other | Attending: Infectious Diseases | Admitting: Infectious Diseases

## 2023-05-04 ENCOUNTER — Other Ambulatory Visit: Payer: Self-pay

## 2023-05-04 ENCOUNTER — Encounter (HOSPITAL_COMMUNITY): Payer: Self-pay | Admitting: Student in an Organized Health Care Education/Training Program

## 2023-05-04 DIAGNOSIS — I428 Other cardiomyopathies: Secondary | ICD-10-CM | POA: Diagnosis present

## 2023-05-04 DIAGNOSIS — M317 Microscopic polyangiitis: Secondary | ICD-10-CM | POA: Diagnosis present

## 2023-05-04 DIAGNOSIS — Z79899 Other long term (current) drug therapy: Secondary | ICD-10-CM | POA: Diagnosis not present

## 2023-05-04 DIAGNOSIS — Z9842 Cataract extraction status, left eye: Secondary | ICD-10-CM

## 2023-05-04 DIAGNOSIS — I96 Gangrene, not elsewhere classified: Secondary | ICD-10-CM | POA: Diagnosis present

## 2023-05-04 DIAGNOSIS — R569 Unspecified convulsions: Secondary | ICD-10-CM

## 2023-05-04 DIAGNOSIS — S7012XA Contusion of left thigh, initial encounter: Secondary | ICD-10-CM | POA: Diagnosis not present

## 2023-05-04 DIAGNOSIS — I7782 Antineutrophilic cytoplasmic antibody (ANCA) vasculitis: Secondary | ICD-10-CM | POA: Diagnosis present

## 2023-05-04 DIAGNOSIS — I5042 Chronic combined systolic (congestive) and diastolic (congestive) heart failure: Secondary | ICD-10-CM | POA: Diagnosis present

## 2023-05-04 DIAGNOSIS — T148XXA Other injury of unspecified body region, initial encounter: Secondary | ICD-10-CM | POA: Diagnosis present

## 2023-05-04 DIAGNOSIS — I776 Arteritis, unspecified: Secondary | ICD-10-CM | POA: Diagnosis not present

## 2023-05-04 DIAGNOSIS — I10 Essential (primary) hypertension: Secondary | ICD-10-CM | POA: Diagnosis present

## 2023-05-04 DIAGNOSIS — Z7952 Long term (current) use of systemic steroids: Secondary | ICD-10-CM

## 2023-05-04 DIAGNOSIS — I129 Hypertensive chronic kidney disease with stage 1 through stage 4 chronic kidney disease, or unspecified chronic kidney disease: Secondary | ICD-10-CM | POA: Diagnosis not present

## 2023-05-04 DIAGNOSIS — S8012XA Contusion of left lower leg, initial encounter: Secondary | ICD-10-CM | POA: Diagnosis present

## 2023-05-04 DIAGNOSIS — D696 Thrombocytopenia, unspecified: Secondary | ICD-10-CM | POA: Diagnosis present

## 2023-05-04 DIAGNOSIS — W228XXA Striking against or struck by other objects, initial encounter: Secondary | ICD-10-CM | POA: Diagnosis present

## 2023-05-04 DIAGNOSIS — Z7982 Long term (current) use of aspirin: Secondary | ICD-10-CM | POA: Diagnosis not present

## 2023-05-04 DIAGNOSIS — I130 Hypertensive heart and chronic kidney disease with heart failure and stage 1 through stage 4 chronic kidney disease, or unspecified chronic kidney disease: Secondary | ICD-10-CM | POA: Diagnosis present

## 2023-05-04 DIAGNOSIS — E785 Hyperlipidemia, unspecified: Secondary | ICD-10-CM | POA: Diagnosis present

## 2023-05-04 DIAGNOSIS — Z8249 Family history of ischemic heart disease and other diseases of the circulatory system: Secondary | ICD-10-CM

## 2023-05-04 DIAGNOSIS — I69344 Monoplegia of lower limb following cerebral infarction affecting left non-dominant side: Secondary | ICD-10-CM | POA: Diagnosis not present

## 2023-05-04 DIAGNOSIS — Z9841 Cataract extraction status, right eye: Secondary | ICD-10-CM | POA: Diagnosis not present

## 2023-05-04 DIAGNOSIS — I509 Heart failure, unspecified: Secondary | ICD-10-CM | POA: Diagnosis not present

## 2023-05-04 DIAGNOSIS — I110 Hypertensive heart disease with heart failure: Secondary | ICD-10-CM | POA: Diagnosis not present

## 2023-05-04 DIAGNOSIS — N184 Chronic kidney disease, stage 4 (severe): Secondary | ICD-10-CM | POA: Diagnosis present

## 2023-05-04 DIAGNOSIS — S8011XA Contusion of right lower leg, initial encounter: Secondary | ICD-10-CM | POA: Diagnosis not present

## 2023-05-04 LAB — CBC
HCT: 29.1 % — ABNORMAL LOW (ref 39.0–52.0)
Hemoglobin: 10.4 g/dL — ABNORMAL LOW (ref 13.0–17.0)
MCH: 31 pg (ref 26.0–34.0)
MCHC: 35.7 g/dL (ref 30.0–36.0)
MCV: 86.9 fL (ref 80.0–100.0)
Platelets: 115 10*3/uL — ABNORMAL LOW (ref 150–400)
RBC: 3.35 MIL/uL — ABNORMAL LOW (ref 4.22–5.81)
RDW: 12.3 % (ref 11.5–15.5)
WBC: 7 10*3/uL (ref 4.0–10.5)
nRBC: 0 % (ref 0.0–0.2)

## 2023-05-04 LAB — BASIC METABOLIC PANEL
Anion gap: 13 (ref 5–15)
BUN: 110 mg/dL — ABNORMAL HIGH (ref 8–23)
CO2: 21 mmol/L — ABNORMAL LOW (ref 22–32)
Calcium: 8.3 mg/dL — ABNORMAL LOW (ref 8.9–10.3)
Chloride: 102 mmol/L (ref 98–111)
Creatinine, Ser: 4.76 mg/dL — ABNORMAL HIGH (ref 0.61–1.24)
GFR, Estimated: 13 mL/min — ABNORMAL LOW (ref >60)
Glucose, Bld: 160 mg/dL — ABNORMAL HIGH (ref 70–99)
Potassium: 3.8 mmol/L (ref 3.5–5.1)
Sodium: 136 mmol/L (ref 135–145)

## 2023-05-04 LAB — CK: Total CK: 31 U/L — ABNORMAL LOW (ref 49–397)

## 2023-05-04 LAB — HEMOGLOBIN A1C
Hgb A1c MFr Bld: 6.1 % — ABNORMAL HIGH (ref 4.8–5.6)
Mean Plasma Glucose: 128.37 mg/dL

## 2023-05-04 LAB — APTT: aPTT: 29 s (ref 24–36)

## 2023-05-04 LAB — HIV ANTIBODY (ROUTINE TESTING W REFLEX): HIV Screen 4th Generation wRfx: NONREACTIVE

## 2023-05-04 LAB — PROTIME-INR
INR: 1 (ref 0.8–1.2)
Prothrombin Time: 13.2 s (ref 11.4–15.2)

## 2023-05-04 MED ORDER — ATORVASTATIN CALCIUM 80 MG PO TABS
80.0000 mg | ORAL_TABLET | Freq: Every day | ORAL | Status: DC
  Administered 2023-05-04 – 2023-05-09 (×6): 80 mg via ORAL
  Filled 2023-05-04 (×6): qty 1

## 2023-05-04 MED ORDER — AZATHIOPRINE 50 MG PO TABS
50.0000 mg | ORAL_TABLET | Freq: Every day | ORAL | Status: DC
  Administered 2023-05-04 – 2023-05-10 (×7): 50 mg via ORAL
  Filled 2023-05-04 (×7): qty 1

## 2023-05-04 MED ORDER — PREDNISONE 10 MG PO TABS
5.0000 mg | ORAL_TABLET | Freq: Every day | ORAL | Status: DC
  Administered 2023-05-05 – 2023-05-10 (×6): 5 mg via ORAL
  Filled 2023-05-04 (×6): qty 1

## 2023-05-04 MED ORDER — TIZANIDINE HCL 4 MG PO TABS
4.0000 mg | ORAL_TABLET | Freq: Three times a day (TID) | ORAL | Status: DC | PRN
  Administered 2023-05-06 – 2023-05-09 (×2): 4 mg via ORAL
  Filled 2023-05-04 (×2): qty 1

## 2023-05-04 NOTE — ED Notes (Signed)
RN assisted pt to restroom. 

## 2023-05-04 NOTE — Consult Note (Signed)
ORTHOPAEDIC CONSULTATION  REQUESTING PHYSICIAN: No att. providers found  Chief Complaint: Hematoma left calf.  HPI: [name] is a 62 y.o. male who presents with acute expansile hematoma left calf secondary to bumping his leg on a car door.  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  Patient has associated history of stroke hemiparesis and renal failure.  Past Medical History:  Diagnosis Date   Arthritis    CHF (congestive heart failure) (HCC)    Combined systolic and diastolic cardiac dysfunction    Echo 11/03/2013 EF 20%, grade 3 diastolic dysfunction   Hypertension    NICM (nonischemic cardiomyopathy) (HCC)    a. L/RHC (11/05/13): RA: 3, RV 52/5, PA 49/19 (31), PCWP 10, AO 166/93, PA 67%, Fick CO/CI: 5.71/2.97, Lmain: normal, LAD: large, without signficant dz, first diagonal has 20% dz at ostium, LCx: normal, RCA: 30% stenosis at the bifurcation of PDA and PLOM   Stroke [facility])    Past Surgical History:  Procedure Laterality Date   BIOPSY  01/31/2019   Procedure: BIOPSY;  Surgeon: Kathi Der, MD;  Location: [facility] ENDOSCOPY;  Service: Gastroenterology;;   CATARACT EXTRACTION Bilateral 05/2021   ESOPHAGOGASTRODUODENOSCOPY N/A 01/31/2019   Procedure: ESOPHAGOGASTRODUODENOSCOPY (EGD);  Surgeon: Kathi Der, MD;  Location: [facility] ENDOSCOPY;  Service: Gastroenterology;  Laterality: N/A;   IR FLUORO GUIDE CV LINE RIGHT  07/25/2018   IR US GUIDE VASC ACCESS RIGHT  07/25/2018   LEFT AND RIGHT HEART CATHETERIZATION WITH CORONARY ANGIOGRAM N/A 11/05/2013   Procedure: LEFT AND RIGHT HEART CATHETERIZATION WITH CORONARY ANGIOGRAM;  Surgeon: Peter M Swaziland, MD;  Location: [facility] CATH LAB;  Service: Cardiovascular;  Laterality: N/A;   RIGHT HEART CATHETERIZATION N/A 01/14/2014   Procedure: RIGHT HEART CATH;  Surgeon: Laurey Morale, MD;  Location: [facility] CATH LAB;  Service: Cardiovascular;  Laterality: N/A;   Social History   Socioeconomic History   Marital  status: Married    Spouse name: BRENDA   Number of children: 0   Years of education: 12   Highest education level: 12th grade  Occupational History   Occupation: Cabin crew  Tobacco Use   Smoking status: Never   Smokeless tobacco: Never  Substance and Sexual Activity   Alcohol use: Yes    Alcohol/week: 3.0 standard drinks of alcohol    Types: 3 Glasses of wine per week   Drug use: No   Sexual activity: Not Currently    Birth control/protection: None  Other Topics Concern   Not on file  Social History Narrative   Lives with wife at home with dog Abbie.     Social Drivers of Corporate investment banker Strain: Not on file  Food Insecurity: No Food Insecurity (07/17/2018)   Hunger Vital Sign    Worried About Running Out of Food in the Last Year: Never true    Ran Out of Food in the Last Year: Never true  Transportation Needs: No Transportation Needs (07/17/2018)   PRAPARE - Administrator, Civil Service (Medical): No    Lack of Transportation (Non-Medical): No  Physical Activity: Sufficiently Active (07/17/2018)   Exercise Vital Sign    Days of Exercise per Week: 5 days    Minutes of Exercise per Session: 30 min  Stress: No Stress Concern Present (07/17/2018)   [employer] of Occupational Health - Occupational Stress Questionnaire    Feeling of Stress : Not at all  Social Connections: Somewhat Isolated (07/17/2018)   Social Connection  and Isolation Panel [NHANES]    Frequency of Communication with Friends and Family: More than three times a week    Frequency of Social Gatherings with Friends and Family: Twice a week    Attends Religious Services: Never    Database administrator or Organizations: No    Attends Engineer, structural: Never    Marital Status: Married   Family History  Problem Relation Age of Onset   Heart attack Father 60   Heart disease Father    Arrhythmia Father    Hypertension Brother    Hypertension Brother    - negative except  otherwise stated in the family history section Allergies  Allergen Reactions   Hydralazine Hcl     "Almost killed him" per wife   Prior to Admission medications   Medication Sig Start Date End Date Taking? Authorizing Provider  acetaminophen (TYLENOL) 325 MG tablet Take 2 tablets (650 mg total) by mouth every 6 (six) hours as needed for moderate pain. 02/03/19  Yes Angiulli, Mcarthur Rossetti, PA-C  amLODipine (NORVASC) 10 MG tablet Take 1 tablet (10 mg total) by mouth daily. 02/04/19  Yes Angiulli, Mcarthur Rossetti, PA-C  aspirin EC 81 MG EC tablet Take 1 tablet (81 mg total) by mouth daily. 02/04/19  Yes Angiulli, Mcarthur Rossetti, PA-C  atorvastatin (LIPITOR) 80 MG tablet Take 1 tablet (80 mg total) by mouth daily at 6 PM. 02/17/19 05/03/24 Yes Jones Bales, NP  azaTHIOprine (IMURAN) 50 MG tablet Take 1 tablet (50 mg total) by mouth daily. 02/04/19  Yes Angiulli, Mcarthur Rossetti, PA-C  carvedilol (COREG) 12.5 MG tablet Take 12.5 mg by mouth 2 (two) times daily with a meal.   Yes [provider]  doxazosin (CARDURA) 8 MG tablet 8 mg 2 (two) times daily. Increased to 4 mg 2x/day per pt conversation with nephrologist   Yes [provider]  predniSONE (DELTASONE) 5 MG tablet Take 5 mg by mouth daily with breakfast.   Yes [provider]  tiZANidine (ZANAFLEX) 4 MG tablet Take 1 tablet (4 mg total) by mouth every 8 (eight) hours as needed for muscle spasms. 05/28/22  Yes Ihor Austin, NP   CT EXTREMITY LOWER LEFT WO CONTRAST Result Date: 05/04/2023 CLINICAL DATA:  Lower leg trauma. Patient was working on car when he hit his left leg resulting in hematoma. EXAM: CT OF THE LOWER LEFT EXTREMITY WITHOUT CONTRAST TECHNIQUE: Multidetector CT imaging of the lower left extremity was performed according to the standard protocol. RADIATION DOSE REDUCTION: This exam was performed according to the departmental dose-optimization program which includes automated exposure control, adjustment of the mA and/or kV  according to patient size and/or use of iterative reconstruction technique. COMPARISON:  None Available. FINDINGS: Bones/Joint/Cartilage No acute fracture or dislocation. Ligaments Suboptimally assessed by CT. Muscles and Tendons Superficial hematoma scratch set superficial mass extending along the lateral aspect of the left lower leg has mass effect upon the anterolateral muscle compartment. No signs of intramuscular fluid collection or mass. Soft tissues Soft tissue edema within the subcutaneous fat extends the length of the lateral soft tissues of the left lower extremity. Mild anteromedial subcutaneous edema/soft tissue swelling within the proximal left lower leg is also identified. Overlying the mid shaft of the fibula there is a large, heterogeneous hyperdense mass extending along the subcutaneous soft tissues of the lateral aspect of the left lower leg. This measures 7.6 x 3.7 by 14.4 cm, image 38/7. IMPRESSION: 1. No evidence for acute fracture or dislocation.  2. Large, heterogeneous hyperdense mass extending along the subcutaneous soft tissues of the lateral aspect of the left lower leg. This measures 7.6 x 3.7 x 14.4 cm. Imaging features are compatible with a large superficial hematoma. 3. Soft tissue edema within the subcutaneous fat extends along the lateral soft tissues of the left lower extremity. Mild anteromedial subcutaneous edema/soft tissue swelling is also identified. Electronically Signed   By: Signa Kell M.D.   On: 05/04/2023 06:52   - pertinent xrays, CT, MRI studies were reviewed and independently interpreted  Positive ROS: All other systems have been reviewed and were otherwise negative with the exception of those mentioned in the HPI and as above.  Physical Exam: General: Alert, no acute distress Psychiatric: Patient is competent for consent with normal mood and affect Lymphatic: No axillary or cervical lymphadenopathy Cardiovascular: No pedal edema Respiratory: No cyanosis,  no use of accessory musculature GI: No organomegaly, abdomen is soft and non-tender    Images:  @ENCIMAGES @  Labs:  Lab Results  Component Value Date   HGBA1C 5.4 01/12/2019   HGBA1C 6.0 (H) 11/02/2013    Lab Results  Component Value Date   ALBUMIN 4.6 05/28/2022   ALBUMIN 4.9 (H) 05/25/2021   ALBUMIN 4.7 04/21/2020        Latest Ref Rng & Units 05/04/2023    4:29 AM 09/17/2022    7:14 AM 05/28/2022    9:16 AM  CBC EXTENDED  WBC 4.0 - 10.5 K/uL 7.0  5.8  8.5   RBC 4.22 - 5.81 MIL/uL 3.35  4.39  4.24   Hemoglobin 13.0 - 17.0 g/dL 32.9  51.8  84.1   HCT 39.0 - 52.0 % 29.1  38.0  39.9   Platelets 150 - 400 K/uL 115  96  173   NEUT# 1.7 - 7.7 K/uL  4.9  7.8   Lymph# 0.7 - 4.0 K/uL  0.4  0.3     Neurologic: Patient does not have protective sensation bilateral lower extremities.   MUSCULOSKELETAL:   Skin: Examination patient has a large expansile hematoma left calf.  I cannot palpate a pulse on the left foot.  This may be secondary to his vasculitis.  Hemoglobin 10.4 with a white cell count of 7.  Hemoglobin A1c 4 years ago was 5.4.  BUN 110 with a creatinine of 4.76 with a estimated GFR of 16.  Review of the CT scan shows no bony involvement with the hematoma.  Assessment: Assessment: Hematoma left leg with multiple medical problems including vasculitis, renal disease.  Plan: Will plan for initial debridement of the left leg hematoma tomorrow morning.  Anticipate Dr. Christell Constant will repeat the debridement on Thursday or Friday.  I will order ankle-brachial indices to obtain a baseline and will order hemoglobin A1c.  Patient will need a renal consult.  With the anticipated elevated myoglobin from this blunt trauma patient has increased risk of worsening renal failure.  Thank you for the consult and the opportunity to see Mr. Cayleb Trester, MD [facility] [phone] 10:07 AM

## 2023-05-04 NOTE — ED Provider Notes (Signed)
[facility]-EMERGENCY DEPT [facility] Emergency Department Provider Note MRN:  [id number]  Arrival date & time: 05/04/23     Chief Complaint   Hematoma   History of Present Illness   Jeremy Grant is a 62 y.o. year-old male presents to the ED with chief complaint of bleeding hematoma to the left leg.  He states that he knocked his leg against the door of his jeep yesterday morning.  He states that it swelled up significantly.  He takes a baby aspirin daily.  States that tonight he stood up and the skin ripped open and it started bleeding.  He denies pain.  He denies numbness or tingling in the toes.    History provided by patient.   Review of Systems  Pertinent positive and negative review of systems noted in HPI.    Physical Exam   Vitals:   05/04/23 0326  BP: 130/79  Pulse: 65  Resp: 18  Temp: 98.7 F (37.1 C)  SpO2: 100%    CONSTITUTIONAL:  non toxic-appearing, NAD NEURO:  Alert and oriented x 3, CN 3-12 grossly intact, distal sensation intact EYES:  eyes equal and reactive ENT/NECK:  Supple, no stridor  CARDIO:  normal rate, appears well-perfused, intact distal pulses, brisk cap refill PULM:  No respiratory distress,  GI/GU:  non-distended,  MSK/SPINE:  No gross deformities, no edema, moves all extremities, compartments feels soft. SKIN:  no rash, (13 cm x 6 cm) large hematoma to the left lower leg as pictured with central open wound    *Additional and/or pertinent findings included in MDM below  Diagnostic and Interventional Summary    EKG Interpretation Date/Time:    Ventricular Rate:    PR Interval:    QRS Duration:    QT Interval:    QTC Calculation:   R Axis:      Text Interpretation:         Labs Reviewed  CBC - Abnormal; Notable for the following components:      Result Value   RBC 3.35 (*)    Hemoglobin 10.4 (*)    HCT 29.1 (*)    Platelets 115 (*)    All other components within normal limits  BASIC METABOLIC PANEL - Abnormal; Notable  for the following components:   CO2 21 (*)    Glucose, Bld 160 (*)    BUN 110 (*)    Creatinine, Ser 4.76 (*)    Calcium 8.3 (*)    GFR, Estimated 13 (*)    All other components within normal limits  PROTIME-INR  APTT    CT EXTREMITY LOWER LEFT WO CONTRAST    (Results Pending)    Medications - No data to display   Procedures  /  Critical Care Procedures  ED Course and Medical Decision Making  I have reviewed the triage vital signs, the nursing notes, and pertinent available records from the EMR.  Social Determinants Affecting Complexity of Care: Patient has no clinically significant social determinants affecting this chief complaint..   ED Course: Clinical Course as of 05/04/23 0637  Sat May 04, 2023  0546 Basic metabolic panel(!) Creatinine increased from baseline.  Patient states he's not on dialysis.  Has known CKD.  CT will need to be non-con. [RB]  0547 CBC(!) HGB down several points from most recent (10.4 down from 13.8). [RB]  0548 Protime-INR [RB]  0549 APTT [RB]    Clinical Course User Index [RB] Roxy Horseman, PA-C    Medical Decision Making Amount and/or  Complexity of Data Reviewed Labs: ordered. Decision-making details documented in ED Course. Radiology: ordered.  Risk Decision regarding hospitalization.         Consultants: I consulted with Dr. Dossie Der, who recommends evacuation, but states that gen. Surg. Doesn't operate below the knee and suggests I call ortho.  I consulted with Dr. Christell Constant, ortho, who is appreciated for coming to see the patient.  Anticipate to OR for management/evacuation of the hematoma.   Treatment and Plan: Patient's exam and diagnostic results are concerning for large hematoma needing operative management.  Feel that patient will need admission to the hospital for further treatment and evaluation.  Patient seen by and discussed with attending physician, Dr. Blinda Leatherwood, who recommend CT of the leg to rule out deeper  soft tissue injury.  Will likely need ortho vs general surgery consult for evacuation of hematoma following CT.  Final Clinical Impressions(s) / ED Diagnoses     ICD-10-CM   1. Hematoma  [email or web address]       ED Discharge Orders     None         Discharge Instructions Discussed with and Provided to Patient:   Discharge Instructions   None      Roxy Horseman, PA-C 05/04/23 1610    Gilda Crease, MD 05/04/23 [phone]

## 2023-05-04 NOTE — H&P (Signed)
Date: 05/04/2023               Patient Name:  Jeremy Grant MRN: [id number]  DOB: [birth year] Age / Sex: 62 y.o., male   PCP: Dois Davenport, MD         Medical Service: Internal Medicine Teaching Service         Attending Physician: Dr. Oswaldo Done, Marquita Palms, *      First Contact: Dr. Katheran James, DO Pager [phone]    Second Contact: Dr. Olegario Messier, MD Pager [phone]         After Hours (After 5p/  First Contact Pager: [phone]  weekends / holidays): Second Contact Pager: [phone]   SUBJECTIVE   Chief Complaint: L Lower Leg Injury  History of Present Illness:  Jeremy Grant is a 62 year old male with a past medical history of microscopic polyangiitis, stage IV CKD, and multiple CVAs.  He is presenting this [place] after injuring his left leg on his jeep yesterday evening.  He said he was working on the car and he opened the door and collided with his lateral leg.  Thereafter he developed a hematoma that slowly swelled throughout the evening.  Overnight last night it spontaneously ruptured and bled when he was getting out of bed.  It was at that point that he sought ER evaluation.  He reports a history of microscopic polyangiitis which is caused him to bleed easily.  Has also damaged his kidneys and for that reason he was at one point on dialysis, although has fortunately been off of dialysis recently.  Presently he denies any pain, fever, or chills.  He denies any trouble with movement of the left lower extremity.  ED Course: Presented to [facility] via EMS around 3 AM.  Vital stable at presentation.  13 cm x 6 mm in meter large hematoma on the left lower leg was noted with open wound in the center.  Laboratory workup revealed hemoglobin of 10.4, creatinine 4.76, GFR 13.  CT left lower extremity did not reveal acute fracture or dislocation, 7.6 x 3.7 x 14.4 cm hematoma was noted.  Peds consulted with plan for debridement of left leg tomorrow and repeat debridement in  5 to 6 days.  Past Medical History Past Medical History:  Diagnosis Date   Arthritis    CHF (congestive heart failure) (HCC)    Combined systolic and diastolic cardiac dysfunction    Echo 11/03/2013 EF 20%, grade 3 diastolic dysfunction   Hypertension    NICM (nonischemic cardiomyopathy) (HCC)    a. L/RHC (11/05/13): RA: 3, RV 52/5, PA 49/19 (31), PCWP 10, AO 166/93, PA 67%, Fick CO/CI: 5.71/2.97, Lmain: normal, LAD: large, without signficant dz, first diagonal has 20% dz at ostium, LCx: normal, RCA: 30% stenosis at the bifurcation of PDA and PLOM   Stroke (HCC)     Meds:  Aspirin 81mg   Doxazosin  Atorvastatiib 80  Prednisone 5mg   Tizanidibe 4mg   Amlodipine 10mg   Coreg 12.2mh bid  Azathioprine-imuran 50mg   Allergic Hydralazine   Past Surgical History Past Surgical History:  Procedure Laterality Date   BIOPSY  01/31/2019   Procedure: BIOPSY;  Surgeon: Kathi Der, MD;  Location: [facility] ENDOSCOPY;  Service: Gastroenterology;;   CATARACT EXTRACTION Bilateral 05/2021   ESOPHAGOGASTRODUODENOSCOPY N/A 01/31/2019   Procedure: ESOPHAGOGASTRODUODENOSCOPY (EGD);  Surgeon: Kathi Der, MD;  Location: [facility] ENDOSCOPY;  Service: Gastroenterology;  Laterality: N/A;   IR FLUORO GUIDE CV LINE RIGHT  07/25/2018   IR US GUIDE VASC  ACCESS RIGHT  07/25/2018   LEFT AND RIGHT HEART CATHETERIZATION WITH CORONARY ANGIOGRAM N/A 11/05/2013   Procedure: LEFT AND RIGHT HEART CATHETERIZATION WITH CORONARY ANGIOGRAM;  Surgeon: Peter M Swaziland, MD;  Location: [facility] CATH LAB;  Service: Cardiovascular;  Laterality: N/A;   RIGHT HEART CATHETERIZATION N/A 01/14/2014   Procedure: RIGHT HEART CATH;  Surgeon: Laurey Morale, MD;  Location: [facility] CATH LAB;  Service: Cardiovascular;  Laterality: N/A;   Social:  Lives his wife Occupation: Back, disability Support: From his wife Level of Function: Independent in ADLs, IADLs PCP: Dois Davenport, MD Substances: None  Family History:  Heart  Diseae  Allergies: Allergies as of 05/04/2023 - Review Complete 05/04/2023  Allergen Reaction Noted   Hydralazine hcl  07/20/2018   Review of Systems: A complete ROS was negative except as per HPI.   OBJECTIVE:   Physical Exam: Blood pressure 122/70, pulse 65, temperature 98 F (36.7 C), temperature source Oral, resp. rate 17, height 6' (1.829 m), weight 72.5 kg, SpO2 99%.  Constitutional: Well-appearing man. In no acute distress. Cardio:Regular rate and rhythm. No murmurs, rubs, or gallops. 2+ bilateral radial and dorsalis pedis  pulses. Pulm:Clear to auscultation bilaterally. Normal work of breathing on room air. Abdomen: Soft, non-tender, non-distended, positive bowel sounds. EAV:[id number] for extremity edema.  The left lower leg is bandaged in a clean and recently changed wrap.  Associated bruising from his hematoma is apparent beyond the borders of the bandage. Skin:Warm and dry. Neuro:Alert and oriented x3. No focal deficit noted. Psych:Pleasant mood and affect. Left lateral lower leg:   Labs: CBC    Component Value Date/Time   WBC 7.0 05/04/2023 0429   RBC 3.35 (L) 05/04/2023 0429   HGB 10.4 (L) 05/04/2023 0429   HGB 13.2 05/28/2022 0916   HCT 29.1 (L) 05/04/2023 0429   HCT 39.9 05/28/2022 0916   PLT 115 (L) 05/04/2023 0429   PLT 173 05/28/2022 0916   MCV 86.9 05/04/2023 0429   MCV 94 05/28/2022 0916   MCH 31.0 05/04/2023 0429   MCHC 35.7 05/04/2023 0429   RDW 12.3 05/04/2023 0429   RDW 12.5 05/28/2022 0916   LYMPHSABS 0.4 (L) 09/17/2022 0714   LYMPHSABS 0.3 (L) 05/28/2022 0916   MONOABS 0.5 09/17/2022 0714   EOSABS 0.0 09/17/2022 0714   EOSABS 0.0 05/28/2022 0916   BASOSABS 0.0 09/17/2022 0714   BASOSABS 0.0 05/28/2022 0916     CMP     Component Value Date/Time   NA 136 05/04/2023 0429   NA 139 05/28/2022 0916   K 3.8 05/04/2023 0429   CL 102 05/04/2023 0429   CO2 21 (L) 05/04/2023 0429   GLUCOSE 160 (H) 05/04/2023 0429   BUN 110 (H) 05/04/2023  0429   BUN 70 (H) 05/28/2022 0916   CREATININE 4.76 (H) 05/04/2023 0429   CALCIUM 8.3 (L) 05/04/2023 0429   PROT 6.8 05/28/2022 0916   ALBUMIN 4.6 05/28/2022 0916   AST 19 05/28/2022 0916   ALT 23 05/28/2022 0916   ALKPHOS 67 05/28/2022 0916   BILITOT 0.4 05/28/2022 0916   GFRNONAA 13 (L) 05/04/2023 0429   GFRAA 28 (L) 02/02/2019 0537    Imaging: CT EXTREMITY LOWER LEFT WO CONTRAST Result Date: 05/04/2023 CLINICAL DATA:  Lower leg trauma. Patient was working on car when he hit his left leg resulting in hematoma. EXAM: CT OF THE LOWER LEFT EXTREMITY WITHOUT CONTRAST TECHNIQUE: Multidetector CT imaging of the lower left extremity was performed according to the standard protocol. RADIATION DOSE  REDUCTION: This exam was performed according to the departmental dose-optimization program which includes automated exposure control, adjustment of the mA and/or kV according to patient size and/or use of iterative reconstruction technique. COMPARISON:  None Available. FINDINGS: Bones/Joint/Cartilage No acute fracture or dislocation. Ligaments Suboptimally assessed by CT. Muscles and Tendons Superficial hematoma scratch set superficial mass extending along the lateral aspect of the left lower leg has mass effect upon the anterolateral muscle compartment. No signs of intramuscular fluid collection or mass. Soft tissues Soft tissue edema within the subcutaneous fat extends the length of the lateral soft tissues of the left lower extremity. Mild anteromedial subcutaneous edema/soft tissue swelling within the proximal left lower leg is also identified. Overlying the mid shaft of the fibula there is a large, heterogeneous hyperdense mass extending along the subcutaneous soft tissues of the lateral aspect of the left lower leg. This measures 7.6 x 3.7 by 14.4 cm, image 38/7. IMPRESSION: 1. No evidence for acute fracture or dislocation. 2. Large, heterogeneous hyperdense mass extending along the subcutaneous soft  tissues of the lateral aspect of the left lower leg. This measures 7.6 x 3.7 x 14.4 cm. Imaging features are compatible with a large superficial hematoma. 3. Soft tissue edema within the subcutaneous fat extends along the lateral soft tissues of the left lower extremity. Mild anteromedial subcutaneous edema/soft tissue swelling is also identified. Electronically Signed   By: Signa Kell M.D.   On: 05/04/2023 06:52     ASSESSMENT & PLAN:   Assessment & Plan by Problem: Principal Problem:   Traumatic hematoma of left lower leg Active Problems:   Hematoma of left lower leg   Jeremy Grant is a 62 y.o. male with pertinent PMH of microscopic polyangiitis, stage IV CKD, and multiple CVAs who presented with L leg injury and is admitted for traumatic hematoma of the L Lower Leg.  Traumatic hematoma of left lower leg This injury occurred from colliding with the door of his jeep.  Mechanism of injury appears to be relatively mild, but he reports that his microscopic polyangiitis causes him to have easy bleeding events.  There is a severe open wound measuring approximately 4 inches across on the lateral leg.  Stabilized now and in a bandage.  Fortunately, he is not in pain. -Orthopedics involved, thank you, plan is for treatment tomorrow and repeat debridement in 5 to 6 days -Monitor for worsening signs of bleeding or signs of infection  Microscopic Polyangiitis Elevated Creatinine: AKI vs CKD Has a history of microscopic polyangiitis that at 1 point required him to receive hemodialysis.  Fortunately he was able to get off dialysis but reports that his kidney function has been worsening lately and that he is working with his nephrologist.  Today his creatinine is 4.76 compared with the most recent value of 3.7 seven months ago.  Without outside record it is unclear if this is an acute elevation or subacute.  For now, will monitor.  CK and myoglobin are drawn and pending.  If there are severe  elevations or further change in creatinine, will consider consulting nephrology. - Continue home prednisone 5 daily and azathioprine 50mg  daily  History of stroke Stroke occurred in 2020 and he has residual weakness of the left lower extremity.  Hold home aspirin 81mg . Continue atorvastatin per above.  HLD Continue home atorvastatin 80 every day.  Diet: Normal, NPO at midnight VTE: SCDs R leg only IVF: none Code: Full  Dispo: Admit patient to Inpatient with expected length of stay  greater than 2 midnights.  Signed: Katheran James, DO Internal Medicine Resident PGY-1  05/04/2023, 3:22 PM   Dr. Katheran James, DO Pager [phone]

## 2023-05-04 NOTE — H&P (View-Only) (Signed)
ORTHOPAEDIC CONSULTATION  REQUESTING PHYSICIAN: No att. providers found  Chief Complaint: Hematoma left calf.  HPI: Jeremy Grant is a 62 y.o. male who presents with acute expansile hematoma left calf secondary to bumping his leg on a car door.  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  Patient has associated history of stroke hemiparesis and renal failure.  Past Medical History:  Diagnosis Date   Arthritis    CHF (congestive heart failure) (HCC)    Combined systolic and diastolic cardiac dysfunction    Echo 11/03/2013 EF 20%, grade 3 diastolic dysfunction   Hypertension    NICM (nonischemic cardiomyopathy) (HCC)    a. L/RHC (11/05/13): RA: 3, RV 52/5, PA 49/19 (31), PCWP 10, AO 166/93, PA 67%, Fick CO/CI: 5.71/2.97, Lmain: normal, LAD: large, without signficant dz, first diagonal has 20% dz at ostium, LCx: normal, RCA: 30% stenosis at the bifurcation of PDA and PLOM   Stroke [facility])    Past Surgical History:  Procedure Laterality Date   BIOPSY  01/31/2019   Procedure: BIOPSY;  Surgeon: Kathi Der, MD;  Location: [facility] ENDOSCOPY;  Service: Gastroenterology;;   CATARACT EXTRACTION Bilateral 05/2021   ESOPHAGOGASTRODUODENOSCOPY N/A 01/31/2019   Procedure: ESOPHAGOGASTRODUODENOSCOPY (EGD);  Surgeon: Kathi Der, MD;  Location: [facility] ENDOSCOPY;  Service: Gastroenterology;  Laterality: N/A;   IR FLUORO GUIDE CV LINE RIGHT  07/25/2018   IR US GUIDE VASC ACCESS RIGHT  07/25/2018   LEFT AND RIGHT HEART CATHETERIZATION WITH CORONARY ANGIOGRAM N/A 11/05/2013   Procedure: LEFT AND RIGHT HEART CATHETERIZATION WITH CORONARY ANGIOGRAM;  Surgeon: Peter M Swaziland, MD;  Location: [facility] CATH LAB;  Service: Cardiovascular;  Laterality: N/A;   RIGHT HEART CATHETERIZATION N/A 01/14/2014   Procedure: RIGHT HEART CATH;  Surgeon: Laurey Morale, MD;  Location: [facility] CATH LAB;  Service: Cardiovascular;  Laterality: N/A;   Social History   Socioeconomic History   Marital  status: Married    Spouse name: BRENDA   Number of children: 0   Years of education: 12   Highest education level: 12th grade  Occupational History   Occupation: Cabin crew  Tobacco Use   Smoking status: Never   Smokeless tobacco: Never  Substance and Sexual Activity   Alcohol use: Yes    Alcohol/week: 3.0 standard drinks of alcohol    Types: 3 Glasses of wine per week   Drug use: No   Sexual activity: Not Currently    Birth control/protection: None  Other Topics Concern   Not on file  Social History Narrative   Lives with wife at home with dog Abbie.     Social Drivers of Corporate investment banker Strain: Not on file  Food Insecurity: No Food Insecurity (07/17/2018)   Hunger Vital Sign    Worried About Running Out of Food in the Last Year: Never true    Ran Out of Food in the Last Year: Never true  Transportation Needs: No Transportation Needs (07/17/2018)   PRAPARE - Administrator, Civil Service (Medical): No    Lack of Transportation (Non-Medical): No  Physical Activity: Sufficiently Active (07/17/2018)   Exercise Vital Sign    Days of Exercise per Week: 5 days    Minutes of Exercise per Session: 30 min  Stress: No Stress Concern Present (07/17/2018)   [employer] of Occupational Health - Occupational Stress Questionnaire    Feeling of Stress : Not at all  Social Connections: Somewhat Isolated (07/17/2018)   Social Connection  and Isolation Panel [NHANES]    Frequency of Communication with Friends and Family: More than three times a week    Frequency of Social Gatherings with Friends and Family: Twice a week    Attends Religious Services: Never    Database administrator or Organizations: No    Attends Engineer, structural: Never    Marital Status: Married   Family History  Problem Relation Age of Onset   Heart attack Father 60   Heart disease Father    Arrhythmia Father    Hypertension Brother    Hypertension Brother    - negative except  otherwise stated in the family history section Allergies  Allergen Reactions   Hydralazine Hcl     "Almost killed him" per wife   Prior to Admission medications   Medication Sig Start Date End Date Taking? Authorizing Provider  acetaminophen (TYLENOL) 325 MG tablet Take 2 tablets (650 mg total) by mouth every 6 (six) hours as needed for moderate pain. 02/03/19  Yes Angiulli, Mcarthur Rossetti, PA-C  amLODipine (NORVASC) 10 MG tablet Take 1 tablet (10 mg total) by mouth daily. 02/04/19  Yes Angiulli, Mcarthur Rossetti, PA-C  aspirin EC 81 MG EC tablet Take 1 tablet (81 mg total) by mouth daily. 02/04/19  Yes Angiulli, Mcarthur Rossetti, PA-C  atorvastatin (LIPITOR) 80 MG tablet Take 1 tablet (80 mg total) by mouth daily at 6 PM. 02/17/19 05/03/24 Yes Jones Bales, NP  azaTHIOprine (IMURAN) 50 MG tablet Take 1 tablet (50 mg total) by mouth daily. 02/04/19  Yes Angiulli, Mcarthur Rossetti, PA-C  carvedilol (COREG) 12.5 MG tablet Take 12.5 mg by mouth 2 (two) times daily with a meal.   Yes [provider]  doxazosin (CARDURA) 8 MG tablet 8 mg 2 (two) times daily. Increased to 4 mg 2x/day per pt conversation with nephrologist   Yes [provider]  predniSONE (DELTASONE) 5 MG tablet Take 5 mg by mouth daily with breakfast.   Yes [provider]  tiZANidine (ZANAFLEX) 4 MG tablet Take 1 tablet (4 mg total) by mouth every 8 (eight) hours as needed for muscle spasms. 05/28/22  Yes Ihor Austin, NP   CT EXTREMITY LOWER LEFT WO CONTRAST Result Date: 05/04/2023 CLINICAL DATA:  Lower leg trauma. Patient was working on car when he hit his left leg resulting in hematoma. EXAM: CT OF THE LOWER LEFT EXTREMITY WITHOUT CONTRAST TECHNIQUE: Multidetector CT imaging of the lower left extremity was performed according to the standard protocol. RADIATION DOSE REDUCTION: This exam was performed according to the departmental dose-optimization program which includes automated exposure control, adjustment of the mA and/or kV  according to patient size and/or use of iterative reconstruction technique. COMPARISON:  None Available. FINDINGS: Bones/Joint/Cartilage No acute fracture or dislocation. Ligaments Suboptimally assessed by CT. Muscles and Tendons Superficial hematoma scratch set superficial mass extending along the lateral aspect of the left lower leg has mass effect upon the anterolateral muscle compartment. No signs of intramuscular fluid collection or mass. Soft tissues Soft tissue edema within the subcutaneous fat extends the length of the lateral soft tissues of the left lower extremity. Mild anteromedial subcutaneous edema/soft tissue swelling within the proximal left lower leg is also identified. Overlying the mid shaft of the fibula there is a large, heterogeneous hyperdense mass extending along the subcutaneous soft tissues of the lateral aspect of the left lower leg. This measures 7.6 x 3.7 by 14.4 cm, image 38/7. IMPRESSION: 1. No evidence for acute fracture or dislocation.  2. Large, heterogeneous hyperdense mass extending along the subcutaneous soft tissues of the lateral aspect of the left lower leg. This measures 7.6 x 3.7 x 14.4 cm. Imaging features are compatible with a large superficial hematoma. 3. Soft tissue edema within the subcutaneous fat extends along the lateral soft tissues of the left lower extremity. Mild anteromedial subcutaneous edema/soft tissue swelling is also identified. Electronically Signed   By: Signa Kell M.D.   On: 05/04/2023 06:52   - pertinent xrays, CT, MRI studies were reviewed and independently interpreted  Positive ROS: All other systems have been reviewed and were otherwise negative with the exception of those mentioned in the HPI and as above.  Physical Exam: General: Alert, no acute distress Psychiatric: Patient is competent for consent with normal mood and affect Lymphatic: No axillary or cervical lymphadenopathy Cardiovascular: No pedal edema Respiratory: No cyanosis,  no use of accessory musculature GI: No organomegaly, abdomen is soft and non-tender    Images:  @ENCIMAGES @  Labs:  Lab Results  Component Value Date   HGBA1C 5.4 01/12/2019   HGBA1C 6.0 (H) 11/02/2013    Lab Results  Component Value Date   ALBUMIN 4.6 05/28/2022   ALBUMIN 4.9 (H) 05/25/2021   ALBUMIN 4.7 04/21/2020        Latest Ref Rng & Units 05/04/2023    4:29 AM 09/17/2022    7:14 AM 05/28/2022    9:16 AM  CBC EXTENDED  WBC 4.0 - 10.5 K/uL 7.0  5.8  8.5   RBC 4.22 - 5.81 MIL/uL 3.35  4.39  4.24   Hemoglobin 13.0 - 17.0 g/dL 32.9  51.8  84.1   HCT 39.0 - 52.0 % 29.1  38.0  39.9   Platelets 150 - 400 K/uL 115  96  173   NEUT# 1.7 - 7.7 K/uL  4.9  7.8   Lymph# 0.7 - 4.0 K/uL  0.4  0.3     Neurologic: Patient does not have protective sensation bilateral lower extremities.   MUSCULOSKELETAL:   Skin: Examination patient has a large expansile hematoma left calf.  I cannot palpate a pulse on the left foot.  This may be secondary to his vasculitis.  Hemoglobin 10.4 with a white cell count of 7.  Hemoglobin A1c 4 years ago was 5.4.  BUN 110 with a creatinine of 4.76 with a estimated GFR of 16.  Review of the CT scan shows no bony involvement with the hematoma.  Assessment: Assessment: Hematoma left leg with multiple medical problems including vasculitis, renal disease.  Plan: Will plan for initial debridement of the left leg hematoma tomorrow morning.  Anticipate Dr. Christell Constant will repeat the debridement on Thursday or Friday.  I will order ankle-brachial indices to obtain a baseline and will order hemoglobin A1c.  Patient will need a renal consult.  With the anticipated elevated myoglobin from this blunt trauma patient has increased risk of worsening renal failure.  Thank you for the consult and the opportunity to see Mr. Cayleb Trester, MD [facility] [phone] 10:07 AM

## 2023-05-04 NOTE — ED Notes (Signed)
NS order tray

## 2023-05-04 NOTE — ED Notes (Signed)
Redressed left lower extremity with ABD pads and kirlex.

## 2023-05-04 NOTE — ED Notes (Signed)
Pt guaze bleeding through. RN changed dressing and applied coban. RN informed pt to communicate if he feels any throbbing, numbness, tingling, or increased pain. Pt verbalized understanding.

## 2023-05-04 NOTE — ED Notes (Signed)
ED TO INPATIENT HANDOFF REPORT  ED Nurse Name and Phone #: Carollee Herter [phone]  S Name/Age/Gender Genia Del 62 y.o. male Room/Bed: H011C/H011C  Code Status   Code Status: Full Code  Home/SNF/Other Home Patient oriented to: self, place, time, and situation Is this baseline? Yes   Triage Complete: Triage complete  Chief Complaint Traumatic hematoma of left lower leg [S80.12XA]  Triage Note Patient brought in from home from EMS, he was working on his car yesterday when he hit his left leg causing a bruise. Tonight he hit it and it split open and bled. Bleeding controlled by EMS with 2 roller gauze.A&Ox4   VS  116/80 68 18 98% RA    Allergies Allergies  Allergen Reactions   Hydralazine Hcl     "Almost killed him" per wife    Level of Care/Admitting Diagnosis ED Disposition     ED Disposition  Admit   Condition  --   Comment  Hospital Area: [facility] [[id number]]  Level of Care: Med-Surg [16]  May admit patient to [facility] or [facility] if equivalent level of care is available:: No  Covid Evaluation: Asymptomatic - no recent exposure (last 10 days) testing not required  Diagnosis: Traumatic hematoma of left lower leg [[id number]]  Admitting Physician: Tyson Alias [[id number]]  Attending Physician: Tyson Alias [[id number]]  Certification:: I certify this patient will need inpatient services for at least 2 midnights  Expected Medical Readiness: 05/10/2023          B Medical/Surgery History Past Medical History:  Diagnosis Date   Arthritis    CHF (congestive heart failure) (HCC)    Combined systolic and diastolic cardiac dysfunction    Echo 11/03/2013 EF 20%, grade 3 diastolic dysfunction   Hypertension    NICM (nonischemic cardiomyopathy) (HCC)    a. L/RHC (11/05/13): RA: 3, RV 52/5, PA 49/19 (31), PCWP 10, AO 166/93, PA 67%, Fick CO/CI: 5.71/2.97, Lmain: normal, LAD: large, without signficant dz, first diagonal has  20% dz at ostium, LCx: normal, RCA: 30% stenosis at the bifurcation of PDA and PLOM   Stroke [facility])    Past Surgical History:  Procedure Laterality Date   BIOPSY  01/31/2019   Procedure: BIOPSY;  Surgeon: Kathi Der, MD;  Location: [facility] ENDOSCOPY;  Service: Gastroenterology;;   CATARACT EXTRACTION Bilateral 05/2021   ESOPHAGOGASTRODUODENOSCOPY N/A 01/31/2019   Procedure: ESOPHAGOGASTRODUODENOSCOPY (EGD);  Surgeon: Kathi Der, MD;  Location[facility] ENDOSCOPY;  Service: Gastroenterology;  Laterality: N/A;   IR FLUORO GUIDE CV LINE RIGHT  07/25/2018   IR US GUIDE VASC ACCESS RIGHT  07/25/2018   LEFT AND RIGHT HEART CATHETERIZATION WITH CORONARY ANGIOGRAM N/A 11/05/2013   Procedure: LEFT AND RIGHT HEART CATHETERIZATION WITH CORONARY ANGIOGRAM;  Surgeon: Peter M Swaziland, MD;  Location: [facility] CATH LAB;  Service: Cardiovascular;  Laterality: N/A;   RIGHT HEART CATHETERIZATION N/A 01/14/2014   Procedure: RIGHT HEART CATH;  Surgeon: Laurey Morale, MD;  Location: [facility] CATH LAB;  Service: Cardiovascular;  Laterality: N/A;     A IV Location/Drains/Wounds Patient Lines/Drains/Airways Status     Active Line/Drains/Airways     Name Placement date Placement time Site Days   Peripheral IV 05/04/23 20 G Left Antecubital 05/04/23  0440  Antecubital  less than 1   Wound / Incision (Open or Dehisced) 09/17/22 Puncture Lumbar Lateral;Right renal biopsy 09/17/22  0850  Lumbar  229            Intake/Output Last 24 hours No intake  or output data in the 24 hours ending 05/04/23 1505  Labs/Imaging Results for orders placed or performed during the hospital encounter of 05/04/23 (from the past 48 hours)  Protime-INR     Status: None   Collection Time: 05/04/23  4:29 AM  Result Value Ref Range   Prothrombin Time 13.2 11.4 - 15.2 seconds   INR 1.0 0.8 - 1.2    Comment: (NOTE) INR goal varies based on device and disease states. Performed at [facility] Lab, [address], [place],  [place] [address]   APTT     Status: None   Collection Time: 05/04/23  4:29 AM  Result Value Ref Range   aPTT 29 24 - 36 seconds    Comment: Performed at [facility], [address], [place], [place] [address]  CBC     Status: Abnormal   Collection Time: 05/04/23  4:29 AM  Result Value Ref Range   WBC 7.0 4.0 - 10.5 K/uL   RBC 3.35 (L) 4.22 - 5.81 MIL/uL   Hemoglobin 10.4 (L) 13.0 - 17.0 g/dL   HCT 09.8 (L) 11.9 - 14.7 %   MCV 86.9 80.0 - 100.0 fL   MCH 31.0 26.0 - 34.0 pg   MCHC 35.7 30.0 - 36.0 g/dL   RDW 82.9 56.2 - 13.0 %   Platelets 115 (L) 150 - 400 K/uL   nRBC 0.0 0.0 - 0.2 %    Comment: Performed at [facility], [address], [place], [place] [address]  Basic metabolic panel     Status: Abnormal   Collection Time: 05/04/23  4:29 AM  Result Value Ref Range   Sodium 136 135 - 145 mmol/L   Potassium 3.8 3.5 - 5.1 mmol/L   Chloride 102 98 - 111 mmol/L   CO2 21 (L) 22 - 32 mmol/L   Glucose, Bld 160 (H) 70 - 99 mg/dL    Comment: Glucose reference range applies only to samples taken after fasting for at least 8 hours.   BUN 110 (H) 8 - 23 mg/dL   Creatinine, Ser 4.69 (H) 0.61 - 1.24 mg/dL   Calcium 8.3 (L) 8.9 - 10.3 mg/dL   GFR, Estimated 13 (L) >60 mL/min    Comment: (NOTE) Calculated using the CKD-EPI Creatinine Equation (2021)    Anion gap 13 5 - 15    Comment: Performed at [facility], [address], [place], [place] [address]   CT EXTREMITY LOWER LEFT WO CONTRAST Result Date: 05/04/2023 CLINICAL DATA:  Lower leg trauma. Patient was working on car when he hit his left leg resulting in hematoma. EXAM: CT OF THE LOWER LEFT EXTREMITY WITHOUT CONTRAST TECHNIQUE: Multidetector CT imaging of the lower left extremity was performed according to the standard protocol. RADIATION DOSE REDUCTION: This exam was performed according to the departmental dose-optimization program which includes automated exposure control, adjustment of the mA and/or kV according to  patient size and/or use of iterative reconstruction technique. COMPARISON:  None Available. FINDINGS: Bones/Joint/Cartilage No acute fracture or dislocation. Ligaments Suboptimally assessed by CT. Muscles and Tendons Superficial hematoma scratch set superficial mass extending along the lateral aspect of the left lower leg has mass effect upon the anterolateral muscle compartment. No signs of intramuscular fluid collection or mass. Soft tissues Soft tissue edema within the subcutaneous fat extends the length of the lateral soft tissues of the left lower extremity. Mild anteromedial subcutaneous edema/soft tissue swelling within the proximal left lower leg is also  identified. Overlying the mid shaft of the fibula there is a large, heterogeneous hyperdense mass extending along the subcutaneous soft tissues of the lateral aspect of the left lower leg. This measures 7.6 x 3.7 by 14.4 cm, image 38/7. IMPRESSION: 1. No evidence for acute fracture or dislocation. 2. Large, heterogeneous hyperdense mass extending along the subcutaneous soft tissues of the lateral aspect of the left lower leg. This measures 7.6 x 3.7 x 14.4 cm. Imaging features are compatible with a large superficial hematoma. 3. Soft tissue edema within the subcutaneous fat extends along the lateral soft tissues of the left lower extremity. Mild anteromedial subcutaneous edema/soft tissue swelling is also identified. Electronically Signed   By: Signa Kell M.D.   On: 05/04/2023 06:52    Pending Labs Unresulted Labs (From admission, onward)     Start     Ordered   05/05/23 0500  Basic metabolic panel  Tomorrow morning,   R        05/04/23 1235   05/05/23 0500  CBC  Tomorrow morning,   R        05/04/23 1235   05/04/23 1325  CK  Once,   R        05/04/23 1325   05/04/23 1231  HIV Antibody (routine testing w rflx)  (HIV Antibody (Routine testing w reflex) panel)  Once,   R        05/04/23 1235   05/04/23 1015  Hemoglobin A1c  Once,   URGENT         05/04/23 1015   05/04/23 1015  Myoglobin, serum  Once,   URGENT        05/04/23 1015            Vitals/Pain Today's Vitals   05/04/23 0715 05/04/23 0825 05/04/23 1504 05/04/23 1504  BP:      Pulse:      Resp:      Temp:   98 F (36.7 C)   TempSrc:   Oral   SpO2:      Weight:      Height:      PainSc: 0-No pain 0-No pain  0-No pain    Isolation Precautions No active isolations  Medications Medications  atorvastatin (LIPITOR) tablet 80 mg (has no administration in time range)  azaTHIOprine (IMURAN) tablet 50 mg (50 mg Oral Given by Other 05/04/23 1451)  predniSONE (DELTASONE) tablet 5 mg (has no administration in time range)  tiZANidine (ZANAFLEX) tablet 4 mg (has no administration in time range)    Mobility walks with device     Focused Assessments   R Recommendations: See Admitting Provider Note  Report given to:   Additional Notes: Pt needs one assist to ambulate to restroom. Pt usually uses a cane.

## 2023-05-04 NOTE — Hospital Course (Addendum)
Patient Summary:  Jeremy Grant is a 62 year old male with a past medical history of microscopic polyangiitis, stage IV CKD, and multiple CVAs who presented to [facility] ED on 12/21 with a left leg injury and was admitted to the Internal Medicine Teaching Service for lower left leg hematoma.   Traumatic hematoma of left lower leg Patient's injury to left leg occurred from colliding with the door to his Jeep. History of polyangiitis and chronic thrombocytopenia makes him vulnerable to bleeding. Orthopedic surgery was consulted and performed an excisional debridement on 12/22. Patient reported 6.5 out of 10 pain right after the procedure, with improvement in pain on PO oxycodone 5 mg every 4h as needed and IV Dilaudid 0.5 mg every 4h as needed for breakthrough pain. He was also resumed on home tizanidine 4 mg q8H PRN for leg spasms and on IV cefazolin, renally dosed, for prophylaxis. His leg was wrapped in a clean dressing with a surgical drain and wound vac in place. Patient continued to do well with pain regimen, and was able to bear weight as tolerated to mobilize with PT. He was continued on the IV Cefazolin until repeat surgical debridement 12/26 with Jeremy Grant. Patient tolerated procedure well. Patient received an additional dose of IV Cefazolin after the procedure. No outpatient antibiotics were recommended. Left lower extremity was re-wrapped in a clean dressing and a home wound vac was provided. Daily aspirin 81 mg daily was resumed, per orthopedic surgery's recommendation. Patient was assessed by PT, able to bear weight, no outpatient follow up recommended. Patient is to see Jeremy Grant in his office in 1 week (around 05/15/2022).    Microscopic Polyangiitis CKD stage 4 History of microscopic polyangiitis requiring hemodialysis in the past, not currently on HD. Creatinine function seems to be progressively worsening over the last year. Encouraged oral hydration and resumed home prednisone 5 mg daily and  azathioprine 50 mg daily on admission. Creatinine of 4.76 on admission and 3.91 on day of discharge. Recommend outpatient BMP and regular follow up.    HTN Chronic. Mostly normotensive on admission, with BP more persistently elevated to 140s-150s systolic and 70s diastolic throughout admission. Resumed home amlodipine, coreg, and doxazosin as indicated. Discharged on amlodipine 10 mg daily, Coreg 12.5 mg BID, and home dose doxazosin 8 mg BID. Recommend outpatient BP check and follow up.    History of stroke Residual weakness of left lower extremity from stroke in 2020. Held home aspirin 81 mg in the setting of easy bleeding, hematoma, and surgical intervention. Continued home atorvastatin 80 mg on admission. Resumed aspirin 81 mg daily on day of discharge.    HLD Continued home atorvastatin 80 mg daily on admission.

## 2023-05-04 NOTE — ED Triage Notes (Signed)
Patient brought in from home from EMS, he was working on his car yesterday when he hit his left leg causing a bruise. Tonight he hit it and it split open and bled. Bleeding controlled by EMS with 2 roller gauze.A&Ox4   VS  116/80 68 18 98% RA

## 2023-05-04 NOTE — ED Notes (Signed)
RN changed pt gauze and rewrapped with coban

## 2023-05-05 ENCOUNTER — Other Ambulatory Visit: Payer: Self-pay

## 2023-05-05 ENCOUNTER — Inpatient Hospital Stay (HOSPITAL_COMMUNITY): Payer: Medicare Other | Admitting: Certified Registered"

## 2023-05-05 ENCOUNTER — Encounter (HOSPITAL_COMMUNITY)
Admission: EM | Disposition: A | Discharge status: Home / Self Care | Payer: Self-pay | Source: Home / Self Care | Attending: Infectious Diseases

## 2023-05-05 DIAGNOSIS — S8012XA Contusion of left lower leg, initial encounter: Principal | ICD-10-CM

## 2023-05-05 DIAGNOSIS — I5042 Chronic combined systolic (congestive) and diastolic (congestive) heart failure: Secondary | ICD-10-CM

## 2023-05-05 DIAGNOSIS — N184 Chronic kidney disease, stage 4 (severe): Secondary | ICD-10-CM

## 2023-05-05 DIAGNOSIS — S8011XA Contusion of right lower leg, initial encounter: Secondary | ICD-10-CM | POA: Diagnosis not present

## 2023-05-05 DIAGNOSIS — I130 Hypertensive heart and chronic kidney disease with heart failure and stage 1 through stage 4 chronic kidney disease, or unspecified chronic kidney disease: Secondary | ICD-10-CM | POA: Diagnosis not present

## 2023-05-05 HISTORY — PX: I & D EXTREMITY: SHX5045

## 2023-05-05 LAB — BASIC METABOLIC PANEL
Anion gap: 16 — ABNORMAL HIGH (ref 5–15)
BUN: 105 mg/dL — ABNORMAL HIGH (ref 8–23)
CO2: 20 mmol/L — ABNORMAL LOW (ref 22–32)
Calcium: 8.8 mg/dL — ABNORMAL LOW (ref 8.9–10.3)
Chloride: 103 mmol/L (ref 98–111)
Creatinine, Ser: 4.96 mg/dL — ABNORMAL HIGH (ref 0.61–1.24)
GFR, Estimated: 12 mL/min — ABNORMAL LOW (ref >60)
Glucose, Bld: 152 mg/dL — ABNORMAL HIGH (ref 70–99)
Potassium: 3.7 mmol/L (ref 3.5–5.1)
Sodium: 139 mmol/L (ref 135–145)

## 2023-05-05 LAB — CBC
HCT: 29.1 % — ABNORMAL LOW (ref 39.0–52.0)
Hemoglobin: 10.4 g/dL — ABNORMAL LOW (ref 13.0–17.0)
MCH: 31.4 pg (ref 26.0–34.0)
MCHC: 35.7 g/dL (ref 30.0–36.0)
MCV: 87.9 fL (ref 80.0–100.0)
Platelets: 114 10*3/uL — ABNORMAL LOW (ref 150–400)
RBC: 3.31 MIL/uL — ABNORMAL LOW (ref 4.22–5.81)
RDW: 12.5 % (ref 11.5–15.5)
WBC: 5.5 10*3/uL (ref 4.0–10.5)
nRBC: 0 % (ref 0.0–0.2)

## 2023-05-05 LAB — SURGICAL PCR SCREEN
MRSA, PCR: NEGATIVE
Staphylococcus aureus: NEGATIVE

## 2023-05-05 SURGERY — IRRIGATION AND DEBRIDEMENT EXTREMITY
Anesthesia: General | Site: Leg Lower | Laterality: Left

## 2023-05-05 MED ORDER — ROCURONIUM BROMIDE 10 MG/ML (PF) SYRINGE
PREFILLED_SYRINGE | INTRAVENOUS | Status: AC
  Filled 2023-05-05: qty 10

## 2023-05-05 MED ORDER — DEXAMETHASONE SODIUM PHOSPHATE 10 MG/ML IJ SOLN
INTRAMUSCULAR | Status: DC | PRN
  Administered 2023-05-05: 5 mg via INTRAVENOUS

## 2023-05-05 MED ORDER — DEXAMETHASONE SODIUM PHOSPHATE 10 MG/ML IJ SOLN
INTRAMUSCULAR | Status: AC
  Filled 2023-05-05: qty 1

## 2023-05-05 MED ORDER — SODIUM CHLORIDE 0.9 % IV SOLN: INTRAVENOUS | Status: DC | PRN

## 2023-05-05 MED ORDER — FENTANYL CITRATE (PF) 250 MCG/5ML IJ SOLN
INTRAMUSCULAR | Status: AC
  Filled 2023-05-05: qty 5

## 2023-05-05 MED ORDER — 0.9 % SODIUM CHLORIDE (POUR BTL) OPTIME
TOPICAL | Status: DC | PRN
  Administered 2023-05-05: 1000 mL

## 2023-05-05 MED ORDER — ACETAMINOPHEN 10 MG/ML IV SOLN: 1000.0000 mg | Freq: Once | INTRAVENOUS | Status: DC | PRN

## 2023-05-05 MED ORDER — CHLORHEXIDINE GLUCONATE 4 % EX SOLN
60.0000 mL | Freq: Once | CUTANEOUS | Status: AC
Start: 2023-05-05 — End: 2023-05-05
  Administered 2023-05-05: 4 via TOPICAL

## 2023-05-05 MED ORDER — OXYCODONE HCL 5 MG PO TABS
5.0000 mg | ORAL_TABLET | ORAL | Status: DC | PRN
  Administered 2023-05-05: 5 mg via ORAL
  Filled 2023-05-05: qty 1

## 2023-05-05 MED ORDER — OXYCODONE HCL 5 MG PO TABS: 5.0000 mg | ORAL_TABLET | Freq: Once | ORAL | Status: DC | PRN

## 2023-05-05 MED ORDER — ONDANSETRON HCL 4 MG/2ML IJ SOLN
INTRAMUSCULAR | Status: DC | PRN
  Administered 2023-05-05: 4 mg via INTRAVENOUS

## 2023-05-05 MED ORDER — PHENYLEPHRINE 80 MCG/ML (10ML) SYRINGE FOR IV PUSH (FOR BLOOD PRESSURE SUPPORT)
PREFILLED_SYRINGE | INTRAVENOUS | Status: AC
  Filled 2023-05-05: qty 10

## 2023-05-05 MED ORDER — VASHE WOUND IRRIGATION OPTIME
TOPICAL | Status: DC | PRN
  Administered 2023-05-05: 34 [oz_av]

## 2023-05-05 MED ORDER — EPHEDRINE 5 MG/ML INJ
INTRAVENOUS | Status: AC
  Filled 2023-05-05: qty 5

## 2023-05-05 MED ORDER — TRANEXAMIC ACID-NACL 1000-0.7 MG/100ML-% IV SOLN
1000.0000 mg | INTRAVENOUS | Status: AC
  Administered 2023-05-05: 1000 mg via INTRAVENOUS

## 2023-05-05 MED ORDER — POLYETHYLENE GLYCOL 3350 17 G PO PACK
17.0000 g | PACK | Freq: Every day | ORAL | Status: DC
  Filled 2023-05-05 (×4): qty 1

## 2023-05-05 MED ORDER — POVIDONE-IODINE 10 % EX SWAB
2.0000 | Freq: Once | CUTANEOUS | Status: AC
  Administered 2023-05-05: 2 via TOPICAL

## 2023-05-05 MED ORDER — PROPOFOL 10 MG/ML IV BOLUS
INTRAVENOUS | Status: DC | PRN
  Administered 2023-05-05: 110 mg via INTRAVENOUS

## 2023-05-05 MED ORDER — VASOPRESSIN 20 UNIT/ML IV SOLN
INTRAVENOUS | Status: AC
  Filled 2023-05-05: qty 1

## 2023-05-05 MED ORDER — CHLORHEXIDINE GLUCONATE 0.12 % MT SOLN: 15.0000 mL | Freq: Once | OROMUCOSAL | Status: AC

## 2023-05-05 MED ORDER — PHENYLEPHRINE HCL (PRESSORS) 10 MG/ML IV SOLN
INTRAVENOUS | Status: DC | PRN
  Administered 2023-05-05 (×3): 80 ug via INTRAVENOUS

## 2023-05-05 MED ORDER — ONDANSETRON HCL 4 MG/2ML IJ SOLN
INTRAMUSCULAR | Status: AC
  Filled 2023-05-05: qty 2

## 2023-05-05 MED ORDER — CHLORHEXIDINE GLUCONATE 0.12 % MT SOLN
OROMUCOSAL | Status: AC
  Administered 2023-05-05: 15 mL via OROMUCOSAL
  Filled 2023-05-05: qty 15

## 2023-05-05 MED ORDER — HYDROMORPHONE HCL 1 MG/ML IJ SOLN
0.5000 mg | INTRAMUSCULAR | Status: DC | PRN
  Administered 2023-05-09: 0.5 mg via INTRAVENOUS
  Filled 2023-05-05: qty 0.5

## 2023-05-05 MED ORDER — ORAL CARE MOUTH RINSE: 15.0000 mL | Freq: Once | OROMUCOSAL | Status: AC

## 2023-05-05 MED ORDER — CEFAZOLIN SODIUM-DEXTROSE 2-4 GM/100ML-% IV SOLN
2.0000 g | INTRAVENOUS | Status: AC
  Administered 2023-05-05: 2 g via INTRAVENOUS

## 2023-05-05 MED ORDER — LIDOCAINE HCL (PF) 2 % IJ SOLN
INTRAMUSCULAR | Status: DC | PRN
  Administered 2023-05-05: 100 mg via INTRADERMAL

## 2023-05-05 MED ORDER — OXYCODONE HCL 5 MG/5ML PO SOLN: 5.0000 mg | Freq: Once | ORAL | Status: DC | PRN

## 2023-05-05 MED ORDER — ONDANSETRON HCL 4 MG/2ML IJ SOLN: 4.0000 mg | Freq: Once | INTRAMUSCULAR | Status: DC | PRN

## 2023-05-05 MED ORDER — CEFAZOLIN SODIUM-DEXTROSE 2-4 GM/100ML-% IV SOLN
INTRAVENOUS | Status: AC
  Filled 2023-05-05: qty 100

## 2023-05-05 MED ORDER — FENTANYL CITRATE (PF) 100 MCG/2ML IJ SOLN: 25.0000 ug | INTRAMUSCULAR | Status: DC | PRN

## 2023-05-05 MED ORDER — LIDOCAINE 2% (20 MG/ML) 5 ML SYRINGE
INTRAMUSCULAR | Status: AC
  Filled 2023-05-05: qty 5

## 2023-05-05 MED ORDER — FENTANYL CITRATE (PF) 250 MCG/5ML IJ SOLN
INTRAMUSCULAR | Status: DC | PRN
  Administered 2023-05-05 (×2): 50 ug via INTRAVENOUS

## 2023-05-05 MED ORDER — LACTATED RINGERS IV SOLN: INTRAVENOUS | Status: DC

## 2023-05-05 MED ORDER — PROPOFOL 10 MG/ML IV BOLUS
INTRAVENOUS | Status: AC
  Filled 2023-05-05: qty 20

## 2023-05-05 MED ORDER — HYDROMORPHONE HCL 2 MG PO TABS
1.0000 mg | ORAL_TABLET | ORAL | Status: DC | PRN
  Administered 2023-05-09 – 2023-05-10 (×2): 1 mg via ORAL
  Filled 2023-05-05 (×2): qty 1

## 2023-05-05 SURGICAL SUPPLY — 34 items
BAG COUNTER SPONGE SURGICOUNT (BAG) IMPLANT
BLADE SURG 21 STRL SS (BLADE) ×1 IMPLANT
BNDG COHESIVE 6X5 TAN ST LF (GAUZE/BANDAGES/DRESSINGS) IMPLANT
BNDG GAUZE DERMACEA FLUFF 4 (GAUZE/BANDAGES/DRESSINGS) ×2 IMPLANT
CANISTER WOUNDNEG PRESSURE 500 (CANNISTER) IMPLANT
COVER SURGICAL LIGHT HANDLE (MISCELLANEOUS) ×2 IMPLANT
DRAPE U-SHAPE 47X51 STRL (DRAPES) ×1 IMPLANT
DRESSING VERAFLO CLEANS CC MED (GAUZE/BANDAGES/DRESSINGS) IMPLANT
DRSG ADAPTIC 3X8 NADH LF (GAUZE/BANDAGES/DRESSINGS) ×1 IMPLANT
DRSG VERAFLO CLEANSE CC MED (GAUZE/BANDAGES/DRESSINGS) ×1 IMPLANT
DURAPREP 26ML APPLICATOR (WOUND CARE) ×1 IMPLANT
ELECT REM PT RETURN 9FT ADLT (ELECTROSURGICAL) ×1 IMPLANT
ELECTRODE REM PT RTRN 9FT ADLT (ELECTROSURGICAL) IMPLANT
GAUZE SPONGE 4X4 12PLY STRL (GAUZE/BANDAGES/DRESSINGS) ×1 IMPLANT
GLOVE BIOGEL PI IND STRL 9 (GLOVE) ×1 IMPLANT
GLOVE SURG ORTHO 9.0 STRL STRW (GLOVE) ×1 IMPLANT
GOWN STRL REUS W/ TWL XL LVL3 (GOWN DISPOSABLE) ×2 IMPLANT
GRAFT SKIN WND SURGICLOSE M95 (Tissue) IMPLANT
KIT BASIN OR (CUSTOM PROCEDURE TRAY) ×1 IMPLANT
KIT TURNOVER KIT B (KITS) ×1 IMPLANT
MANIFOLD NEPTUNE II (INSTRUMENTS) ×1 IMPLANT
NS IRRIG 1000ML POUR BTL (IV SOLUTION) ×1 IMPLANT
PACK ORTHO EXTREMITY (CUSTOM PROCEDURE TRAY) ×1 IMPLANT
PAD ARMBOARD 7.5X6 YLW CONV (MISCELLANEOUS) ×2 IMPLANT
PAD NEG PRESSURE SENSATRAC (MISCELLANEOUS) IMPLANT
SET HNDPC FAN SPRY TIP SCT (DISPOSABLE) IMPLANT
SPONGE T-LAP 18X18 ~~LOC~~+RFID (SPONGE) IMPLANT
STOCKINETTE IMPERVIOUS 9X36 MD (GAUZE/BANDAGES/DRESSINGS) IMPLANT
SUT ETHILON 2 0 PSLX (SUTURE) ×1 IMPLANT
SWAB COLLECTION DEVICE MRSA (MISCELLANEOUS) ×1 IMPLANT
SWAB CULTURE ESWAB REG 1ML (MISCELLANEOUS) IMPLANT
TOWEL GREEN STERILE (TOWEL DISPOSABLE) ×1 IMPLANT
TUBE CONNECTING 12X1/4 (SUCTIONS) ×1 IMPLANT
YANKAUER SUCT BULB TIP NO VENT (SUCTIONS) ×1 IMPLANT

## 2023-05-05 NOTE — Anesthesia Procedure Notes (Signed)
Procedure Name: LMA Insertion Date/Time: 05/05/2023 8:01 AM  Performed by: Alwyn Ren, CRNAPre-anesthesia Checklist: Emergency Drugs available, Suction available and Patient being monitored Oxygen Delivery Method: Circle system utilized Preoxygenation: Pre-oxygenation with 100% oxygen Induction Type: IV induction LMA: LMA inserted LMA Size: 4.0 Number of attempts: 1

## 2023-05-05 NOTE — Anesthesia Postprocedure Evaluation (Signed)
Anesthesia Post Note  Patient: Jeremy Grant  Procedure(s) Performed: IRRIGATION AND DEBRIDEMENT LEFT CALF (Left: Leg Lower)     Patient location during evaluation: PACU Anesthesia Type: General Level of consciousness: awake and alert Pain management: pain level controlled Vital Signs Assessment: post-procedure vital signs reviewed and stable Respiratory status: spontaneous breathing, nonlabored ventilation, respiratory function stable and patient connected to nasal cannula oxygen Cardiovascular status: blood pressure returned to baseline and stable Postop Assessment: no apparent nausea or vomiting Anesthetic complications: no   No notable events documented.  Last Vitals:  Vitals:   05/05/23 0845 05/05/23 0900  BP: (!) 156/69 (!) 150/78  Pulse: 74 79  Resp: 16 15  Temp: 36.5 C   SpO2: 97% 97%    Last Pain:  Vitals:   05/05/23 0900  TempSrc:   PainSc: 2                  Mariann Barter

## 2023-05-05 NOTE — Transfer of Care (Signed)
Immediate Anesthesia Transfer of Care Note  Patient: Jeremy Grant  Procedure(s) Performed: IRRIGATION AND DEBRIDEMENT LEFT CALF (Left: Leg Lower)  Patient Location: PACU  Anesthesia Type:General  Level of Consciousness: awake, alert , and oriented  Airway & Oxygen Therapy: Patient Spontanous Breathing  Post-op Assessment: Report given to RN and Post -op Vital signs reviewed and stable  Post vital signs: Reviewed and stable  Last Vitals:  Vitals Value Taken Time  BP 156/69 05/05/23 0845  Temp    Pulse 75 05/05/23 0845  Resp 16 05/05/23 0845  SpO2 97 % 05/05/23 0845  Vitals shown include unfiled device data.  Last Pain:  Vitals:   05/05/23 0539  TempSrc: Oral  PainSc:          Complications: No notable events documented.

## 2023-05-05 NOTE — Anesthesia Preprocedure Evaluation (Signed)
Anesthesia Evaluation  Patient identified by MRN, date of birth, ID band Patient awake    Reviewed: Allergy & Precautions, NPO status , Patient's Chart, lab work & pertinent test results, reviewed documented beta blocker date and time   History of Anesthesia Complications Negative for: history of anesthetic complications  Airway Mallampati: II   Neck ROM: Full    Dental  (+) Poor Dentition   Pulmonary neg COPD, neg PE   breath sounds clear to auscultation       Cardiovascular hypertension, (-) angina +CHF  (-) Past MI, (-) Cardiac Stents and (-) CABG (-) pacemaker(-) Cardiac Defibrillator  Rhythm:Regular Rate:Normal  Autoimmune vasculitis   Neuro/Psych neg Headaches, Seizures -, Well Controlled,  CVA, No Residual Symptoms    GI/Hepatic ,neg GERD  ,,(+) neg Cirrhosis        Endo/Other    Renal/GU CRFRenal disease     Musculoskeletal  (+) Arthritis ,    Abdominal   Peds  Hematology  (+) Blood dyscrasia, anemia   Anesthesia Other Findings   Reproductive/Obstetrics                              Anesthesia Physical Anesthesia Plan  ASA: 3  Anesthesia Plan: General   Post-op Pain Management:    Induction: Intravenous  PONV Risk Score and Plan: 2 and Ondansetron and Dexamethasone  Airway Management Planned: LMA  Additional Equipment:   Intra-op Plan:   Post-operative Plan:   Informed Consent: I have reviewed the patients History and Physical, chart, labs and discussed the procedure including the risks, benefits and alternatives for the proposed anesthesia with the patient or authorized representative who has indicated his/her understanding and acceptance.     Dental advisory given  Plan Discussed with: CRNA  Anesthesia Plan Comments:          Anesthesia Quick Evaluation

## 2023-05-05 NOTE — Plan of Care (Signed)

## 2023-05-05 NOTE — Interval H&P Note (Signed)
History and Physical Interval Note:  05/05/2023 7:51 AM  Jeremy Grant  has presented today for surgery, with the diagnosis of LEFT CALF INFECTION.  The various methods of treatment have been discussed with the patient and family. After consideration of risks, benefits and other options for treatment, the patient has consented to  Procedure(s): IRRIGATION AND DEBRIDEMENT LEFT CALF (Left) as a surgical intervention.  The patient's history has been reviewed, patient examined, no change in status, stable for surgery.  I have reviewed the patient's chart and labs.  Questions were answered to the patient's satisfaction.     Nadara Mustard

## 2023-05-05 NOTE — Op Note (Signed)
05/05/2023  8:48 AM  PATIENT:  Jeremy Grant    PRE-OPERATIVE DIAGNOSIS:  LEFT CALF HEMATOMA   POST-OPERATIVE DIAGNOSIS:  Same  PROCEDURE: Excisional DEBRIDEMENT LEFT CALF. Fasciotomies anterior and lateral compartment. Application Kerecis micro graft 95 cm. Application cleanse choice wound VAC sponge x 3 Vashe irrigation  SURGEON:  Nadara Mustard, MD  PHYSICIAN ASSISTANT:None ANESTHESIA:   General  PREOPERATIVE INDICATIONS:  Jeremy Grant is a  62 y.o. male with a diagnosis of LEFT CALF INFECTION who failed conservative measures and elected for surgical management.    The risks benefits and alternatives were discussed with the patient preoperatively including but not limited to the risks of infection, bleeding, nerve injury, cardiopulmonary complications, the need for revision surgery, among others, and the patient was willing to proceed.  OPERATIVE IMPLANTS:   * No implants in log *  @ENCIMAGES @  OPERATIVE FINDINGS: Anterior and lateral compartments had good contractility.  The deep muscle did not have good color or consistency but no evidence of compartment syndrome.  No evidence of abscess.  OPERATIVE PROCEDURE: Patient was brought the operating room and underwent an LMA general anesthetic.  After adequate levels anesthesia were obtained patient's left lower extremity was prepped using DuraPrep draped into a sterile field a timeout was called.  A 21 blade knife was used to ellipse out the necrotic skin and soft tissue.  This left a wound that was 25 x 10 cm.  Fasciotomies performed over the anterior and lateral compartments.  The muscle did not have good color or consistency but it did have good contractility.  No evidence of compartment syndrome no evidence of abscess.  The wound was irrigated with Vashe irrigation.  There was no active bleeding.  The wound bed was filled with 95 cm of Kerecis micro graft to cover a wound surface area 10 x 25 cm.  The cleanse choice wound  VAC sponge was then applied over the wound covered with derma tack.  This had a good suction fit this was overwrapped with Coban patient was extubated taken the PACU in stable condition.   DISCHARGE PLANNING:  Antibiotic duration: Continue antibiotics for 24 hours.  Weightbearing: Weightbearing as tolerated  Pain medication: Opioid pathway  Dressing care/ Wound VAC: Continue wound VAC  Ambulatory devices: Walker  Plan for return to the operating room on Thursday or Friday with Dr. Christell Constant.

## 2023-05-05 NOTE — Progress Notes (Addendum)
HD#1 SUBJECTIVE:  Patient Summary: Mr. Kindred Iskandar is a 62 year old male with a past medical history of microscopic polyangiitis, stage IV CKD, and multiple CVAs who presented to [facility] ED on 12/21 with a L leg injury and is admitted for L Leg hematoma  Overnight Events: None  Interim History: He just returned from his debridement.  There will be another on Friday.  Reportedly it went well.  He does report some pain, presently 6.5 out of 10.  Otherwise he is without complaint or concern.  OBJECTIVE:  Vital Signs: Vitals:   05/04/23 1504 05/04/23 1559 05/04/23 1631 05/04/23 2049  BP:  133/75 (!) 155/85 (!) 161/91  Pulse:  80 80 80  Resp:  16  18  Temp: 98 F (36.7 C)  98 F (36.7 C) 98.2 F (36.8 C)  TempSrc: Oral  Oral Oral  SpO2:  100% 100% 98%  Weight:   68.7 kg   Height:   6\' 1"  (1.854 m)    Supplemental O2: Room Air SpO2: 98 %  Filed Weights   05/04/23 0324 05/04/23 1631  Weight: 72.5 kg 68.7 kg    No intake or output data in the 24 hours ending 05/05/23 0513 Net IO Since Admission: No IO data has been entered for this period [05/05/23 0513]  Physical Exam: Physical Exam Constitutional:      General: He is not in acute distress.    Appearance: Normal appearance. He is not ill-appearing.  Cardiovascular:     Rate and Rhythm: Normal rate and regular rhythm.  Pulmonary:     Effort: Pulmonary effort is normal.  Musculoskeletal:     Right lower leg: No edema.     Left lower leg: No edema.     Comments: Left lower extremity is cleanly bandaged, there is no evidence of swelling or breakthrough bleeding, a drain is in place and is patent with a small amount of clear and bloody fluid.  Skin:    General: Skin is warm.  Neurological:     General: No focal deficit present.     Mental Status: He is alert.  Psychiatric:        Mood and Affect: Mood normal.        Behavior: Behavior normal.     Patient Lines/Drains/Airways Status     Active Line/Drains/Airways      Name Placement date Placement time Site Days   Peripheral IV 05/04/23 20 G Left Antecubital 05/04/23  0440  Antecubital  1   Wound / Incision (Open or Dehisced) 09/17/22 Puncture Lumbar Lateral;Right renal biopsy 09/17/22  0850  Lumbar  230             ASSESSMENT/PLAN:  Assessment: Principal Problem:   Traumatic hematoma of left lower leg Active Problems:   Seizures (HCC)   ANCA-associated vasculitis (HCC)   Hypertension   Thrombocytopenia (HCC)   CKD (chronic kidney disease) stage 4, GFR 15-29 ml/min (HCC)  BARRE ACORS is a 62 y.o. male with pertinent PMH of microscopic polyangiitis, stage IV CKD, and multiple CVAs who presented with L leg injury and is admitted for traumatic hematoma of the L Lower Leg.   Plan: Traumatic hematoma of left lower leg He just returned from his debridement, a relatively quick procedure, reportedly went well.  He reports 6.5 out of 10 pain but otherwise is without complaint.  There will be another debridement later this week, most likely Friday.  For pain we will treat with oxycodone and Dilaudid for breakthrough.  This injury occurred from colliding with the door of his jeep.  Mechanism of injury appears to be relatively mild, but he reports that his polyangiitis causes him to have easy bleeding events.  He does have chronic thrombocytopenia. -Orthopedics involved, thank you, debridement done today and repeat in 4 to 5 days -For pain, oxycodone 5 mg po every 4h as needed, Dilaudid 0.5 mg IV every 4h as needed for breakthrough.  MiraLAX daily to avoid constipation. -Monitor for worsening signs of bleeding or signs of infection   Microscopic Polyangiitis CKD stage 4 History of microscopic polyangiitis at one point requiring hemodialysis.  Fortunately he was able to get off dialysis but reports that his kidney function has been worsening again of late.  Today his creatinine is 4.96, a small increase from yesterday 4.76 and above most recent  outside value of 3.7 seven months ago.  Without outside record it is unclear if this is an acute elevation or subacute.  For now, will monitor.  CK is not elevated, myoglobin drawn and pending.   -Encourage oral hydration -Continue home prednisone 5 daily and azathioprine 50mg  daily   History of stroke Stroke occurred in 2020 and he has residual weakness of the left lower extremity.  Hold home aspirin 81mg . Continue atorvastatin per above.   HLD Continue home atorvastatin 80 every day.  Best Practice: Diet: NPO, regular diet after today's debridement IVF: oral rehydration VTE: SCDs Start: 05/04/23 1231 Code: Full AB: None Family Contact: Onofre [place] (wife) [phone] DISPO: Anticipated discharge in 5 days to Home vs SNF pending  Repeat debridement at end of week .  Signature: Katheran James, D.O.  Internal Medicine Resident, PGY-1 [facility]  Pager: [phone] 5:13 AM, 05/05/2023   Please contact the on call pager after 5 pm and on weekends at [phone].

## 2023-05-06 ENCOUNTER — Encounter (HOSPITAL_COMMUNITY): Payer: Self-pay | Admitting: Orthopedic Surgery

## 2023-05-06 DIAGNOSIS — N184 Chronic kidney disease, stage 4 (severe): Secondary | ICD-10-CM | POA: Diagnosis not present

## 2023-05-06 DIAGNOSIS — T148XXA Other injury of unspecified body region, initial encounter: Secondary | ICD-10-CM | POA: Diagnosis not present

## 2023-05-06 LAB — BASIC METABOLIC PANEL
Anion gap: 11 (ref 5–15)
BUN: 95 mg/dL — ABNORMAL HIGH (ref 8–23)
CO2: 23 mmol/L (ref 22–32)
Calcium: 8.9 mg/dL (ref 8.9–10.3)
Chloride: 107 mmol/L (ref 98–111)
Creatinine, Ser: 4.9 mg/dL — ABNORMAL HIGH (ref 0.61–1.24)
GFR, Estimated: 13 mL/min — ABNORMAL LOW (ref >60)
Glucose, Bld: 164 mg/dL — ABNORMAL HIGH (ref 70–99)
Potassium: 3.7 mmol/L (ref 3.5–5.1)
Sodium: 141 mmol/L (ref 135–145)

## 2023-05-06 LAB — MYOGLOBIN, SERUM: Myoglobin: 94 ng/mL — ABNORMAL HIGH (ref 28–72)

## 2023-05-06 LAB — CBC
HCT: 27.4 % — ABNORMAL LOW (ref 39.0–52.0)
Hemoglobin: 9.5 g/dL — ABNORMAL LOW (ref 13.0–17.0)
MCH: 30.6 pg (ref 26.0–34.0)
MCHC: 34.7 g/dL (ref 30.0–36.0)
MCV: 88.4 fL (ref 80.0–100.0)
Platelets: 120 10*3/uL — ABNORMAL LOW (ref 150–400)
RBC: 3.1 MIL/uL — ABNORMAL LOW (ref 4.22–5.81)
RDW: 12.5 % (ref 11.5–15.5)
WBC: 7 10*3/uL (ref 4.0–10.5)
nRBC: 0 % (ref 0.0–0.2)

## 2023-05-06 MED ORDER — CEFAZOLIN SODIUM-DEXTROSE 1-4 GM/50ML-% IV SOLN
1.0000 g | Freq: Two times a day (BID) | INTRAVENOUS | Status: DC
  Administered 2023-05-06 – 2023-05-09 (×7): 1 g via INTRAVENOUS
  Filled 2023-05-06 (×8): qty 50

## 2023-05-06 MED ORDER — AMLODIPINE BESYLATE 10 MG PO TABS
10.0000 mg | ORAL_TABLET | Freq: Every day | ORAL | Status: DC
  Administered 2023-05-06 – 2023-05-10 (×5): 10 mg via ORAL
  Filled 2023-05-06 (×5): qty 1

## 2023-05-06 MED ORDER — CEFAZOLIN SODIUM-DEXTROSE 1-4 GM/50ML-% IV SOLN
1.0000 g | Freq: Three times a day (TID) | INTRAVENOUS | Status: DC
  Administered 2023-05-06: 1 g via INTRAVENOUS
  Filled 2023-05-06: qty 50

## 2023-05-06 MED ORDER — ENOXAPARIN SODIUM 30 MG/0.3ML IJ SOSY
30.0000 mg | PREFILLED_SYRINGE | Freq: Every day | INTRAMUSCULAR | Status: DC
Start: 2023-05-06 — End: 2023-05-08
  Filled 2023-05-06 (×2): qty 0.3

## 2023-05-06 MED ORDER — ACETAMINOPHEN 325 MG PO TABS
650.0000 mg | ORAL_TABLET | Freq: Four times a day (QID) | ORAL | Status: DC | PRN
  Administered 2023-05-06: 650 mg via ORAL
  Filled 2023-05-06: qty 2

## 2023-05-06 NOTE — Progress Notes (Signed)
MD notified pt refused Lovenox injection.

## 2023-05-06 NOTE — Plan of Care (Signed)
  Problem: Education: Goal: Knowledge of General Education information will improve Description: Including pain rating scale, medication(s)/side effects and non-pharmacologic comfort measures Outcome: Progressing   Problem: Clinical Measurements: Goal: Will remain free from infection Outcome: Progressing Goal: Diagnostic test results will improve Outcome: Progressing Goal: Respiratory complications will improve Outcome: Progressing Goal: Cardiovascular complication will be avoided Outcome: Progressing   Problem: Clinical Measurements: Goal: Diagnostic test results will improve Outcome: Progressing   Problem: Nutrition: Goal: Adequate nutrition will be maintained Outcome: Progressing   Problem: Safety: Goal: Ability to remain free from injury will improve Outcome: Progressing

## 2023-05-06 NOTE — Progress Notes (Signed)
Mobility Specialist Progress Note:    05/06/23 1513  Mobility  Activity Ambulated with assistance in hallway  Level of Assistance Minimal assist, patient does 75% or more  Assistive Device Front wheel walker  Distance Ambulated (ft) 200 ft  Activity Response Tolerated well  Mobility Referral Yes  Mobility visit 1 Mobility  Mobility Specialist Start Time (ACUTE ONLY) 1500  Mobility Specialist Stop Time (ACUTE ONLY) 1513  Mobility Specialist Time Calculation (min) (ACUTE ONLY) 13 min   Pt received in bed, agreeable to mobility session. Ambulated in hallway, MinA to stand, CGA during ambulation. Tolerated well, asx throughout dispite LLE discomfort. Returned pt to room, sitting up in chair, wife at bedside. Left with all needs met, call bell in reach.   Feliciana Rossetti Mobility Specialist Please contact via Special educational needs teacher or  Rehab office at [phone]

## 2023-05-06 NOTE — Progress Notes (Signed)
Transition of Care [facility]) - CAGE-AID Screening   Patient Details  Name: Jeremy Grant MRN: [id number] Date of Birth: 02-Apr-1961  Transition of Care [facility]) CM/SW Contact:    Leota Sauers, RN Phone Number: 05/06/2023, 6:20 AM   Clinical Narrative:  Patient endorses some alcohol use, denies use of illicit substances. Resources not given at this time.   CAGE-AID Screening:    Have You Ever Felt You Ought to Cut Down on Your Drinking or Drug Use?: No Have People Annoyed You By Critizing Your Drinking Or Drug Use?: No Have You Felt Bad Or Guilty About Your Drinking Or Drug Use?: No Have You Ever Had a Drink or Used Drugs First Thing In The Morning to Steady Your Nerves or to Get Rid of a Hangover?: No CAGE-AID Score: 0  Substance Abuse Education Offered: No

## 2023-05-06 NOTE — Progress Notes (Addendum)
HD#2 SUBJECTIVE:  Patient Summary: [name] is a 62 year old male with a past medical history of microscopic polyangiitis, stage IV CKD, and multiple CVAs who presented to [facility] ED on 12/21 with a L leg injury and was admitted to the Internal Medicine Teaching Service for lower left leg hematoma.   Overnight Events:  No overnight events.   Interim History:  Patient reports he had some difficulties with sleep due to positioning, denies any pain in left leg or elsewhere. Has been able to eat and drink without any issues. Wondered whether he would be able to wait for his next surgery at home or if he had to remain hospitalized. Stated his wife is familiar with the wound vac. Will update patient after discussing with ortho.   OBJECTIVE:  Vital Signs: Vitals:   05/05/23 0915 05/05/23 0934 05/05/23 2049 05/06/23 0339  BP: (!) 144/77 139/71 (!) 160/90 (!) 164/73  Pulse: 80 80 91 77  Resp: 16 18 18 18   Temp: 97.8 F (36.6 C) 97.6 F (36.4 C) 98.8 F (37.1 C) 98.7 F (37.1 C)  TempSrc:  Oral Oral Oral  SpO2: 96% 96% 98% 98%  Weight:      Height:          Latest Ref Rng & Units 05/06/2023    5:51 AM 05/05/2023    5:48 AM 05/04/2023    4:29 AM  CBC  WBC 4.0 - 10.5 K/uL 7.0  5.5  7.0   Hemoglobin 13.0 - 17.0 g/dL 9.5  13.2  44.0   Hematocrit 39.0 - 52.0 % 27.4  29.1  29.1   Platelets 150 - 400 K/uL 120  114  115        Latest Ref Rng & Units 05/06/2023    5:51 AM 05/05/2023    5:48 AM 05/04/2023    4:29 AM  BMP  Glucose 70 - 99 mg/dL 102  725  366   BUN 8 - 23 mg/dL 95  440  347   Creatinine 0.61 - 1.24 mg/dL 4.25  9.56  3.87   Sodium 135 - 145 mmol/L 141  139  136   Potassium 3.5 - 5.1 mmol/L 3.7  3.7  3.8   Chloride 98 - 111 mmol/L 107  103  102   CO2 22 - 32 mmol/L 23  20  21    Calcium 8.9 - 10.3 mg/dL 8.9  8.8  8.3     Supplemental O2: Room Air SpO2: 98 % O2 Flow Rate (L/min): 0 L/min  Filed Weights   05/04/23 0324 05/04/23 1631  Weight: 72.5 kg 68.7 kg     Intake/Output Summary (Last 24 hours) at 05/06/2023 0657 Last data filed at 05/05/2023 5643 Gross per 24 hour  Intake 300 ml  Output 25 ml  Net 275 ml   Net IO Since Admission: 275 mL [05/06/23 0657]  Physical Exam: Physical Exam Constitutional:      General: He is not in acute distress.    Appearance: Normal appearance. He is not ill-appearing.  Cardiovascular:     Rate and Rhythm: Normal rate and regular rhythm.  Pulmonary:     Effort: Pulmonary effort is normal.  Musculoskeletal:     Right lower leg: No edema.     Left lower leg: No edema.     Comments: Left lower extremity remains bandaged. No evidence of swelling or gross bleeding. Drain remains in place attached to wound vac with some serosanguinous fluid. Patient non-tender on palpation, able to  wiggle toes and move feet/legs spontaneously.   Skin:    General: Skin is warm.  Neurological:     General: No focal deficit present.     Mental Status: He is alert.  Psychiatric:        Mood and Affect: Mood normal.        Behavior: Behavior normal.    Patient Lines/Drains/Airways Status     Active Line/Drains/Airways     Name Placement date Placement time Site Days   Peripheral IV 05/04/23 20 G Left Antecubital 05/04/23  0440  Antecubital  1   Wound / Incision (Open or Dehisced) 09/17/22 Puncture Lumbar Lateral;Right renal biopsy 09/17/22  0850  Lumbar  230            ASSESSMENT/PLAN:  Assessment: Principal Problem:   Traumatic hematoma of left lower leg Active Problems:   Seizures (HCC)   ANCA-associated vasculitis (HCC)   Hypertension   Thrombocytopenia (HCC)   CKD (chronic kidney disease) stage 4, GFR 15-29 ml/min (HCC)  Jeremy Grant is a 62 yo with pertinent PMH of microscopic polyangiitis, stage IV CKD, and multiple CVAs who presented with L leg injury and was admitted for traumatic hematoma of the left lower leg.    Plan: Traumatic hematoma of left lower leg Patient doing well s/p  debridement with Dr. Azzie Almas surgery on 12/22. Denies any acute pain. Patient started on IV antibiotics and wound vac was placed. Per Dr. Lajoyce Corners, patient is to bear weight as tolerated and continue IV antibiotics and wound vac until repeat surgical debridement Th or F with Dr. Christell Constant. Appreciate orthopedic surgery recommendations. Will continue to monitor for worsening signs of bleeding or infection.  - Pain management: continue oxycodone 5 mg po every 4h as needed, Dilaudid 0.5 mg IV every 4h as needed for breakthrough - Bowel regimen: Miralax daily  - IV Cefazolin 1g TID  - Daily labs - Continue tizanidine 4 mg q8H as needed for muscle spasms  Microscopic Polyangiitis CKD stage 4 History of microscopic polyangiitis requiring hemodialysis in the past, not currently receiving HD. Creatinine function seems to be progressively worsening over the last year. Creatinine on admission 4.76 and GFR 13 with total CK 31, myoglobin pending. Today, Cr 4.90 GFR 13. Home medications continued on admission. Will monitor.  - Continue to encourage oral hydration  - Continue home prednisone 5 mg daily and azathioprine 50 mg daily  HTN Chronic. Mostly normotensive since admission with elevated BP to 150s-160s systolic today. On home amlodipine and coreg. Will resume home antihypertensives as appropriate.  - Resume amlodipine 10 mg daily    History of stroke Residual weakness of left lower extremity from stroke in 2020. Holding home aspirin 81 mg in the setting of easy bleeding, hematoma, and surgical intervention. Continue home atorvastatin 80 mg.   HLD Continued home atorvastatin 80 mg daily on admission.   Best Practice: Diet: Regular  IVF: oral rehydration VTE: sq enoxaparin 30 mg  Code: Full AB: None Family Contact: Jeremy Grant (wife) [phone] DISPO: Anticipated discharge pending surgical debridement   Signature: Campbell Agramonte Colbert Coyer, MD  Internal Medicine Resident, PGY-1 [facility]  6:57 AM, 05/06/2023   Please contact the on call pager after 5 pm and on weekends at [phone].

## 2023-05-06 NOTE — Progress Notes (Signed)
PHARMACY NOTE:  ANTIMICROBIAL RENAL DOSAGE ADJUSTMENT  Current antimicrobial regimen includes a mismatch between antimicrobial dosage and estimated renal function.  As per policy approved by the Pharmacy & Therapeutics and Medical Executive Committees, the antimicrobial dosage will be adjusted accordingly.  Current antimicrobial dosage:  cefazolin 1g IV q8h  Indication: surgical prophylaxis  Renal Function:  Estimated Creatinine Clearance: 15 mL/min (A) (by C-G formula based on SCr of 4.9 mg/dL (H)). []      On intermittent HD, scheduled: []      On CRRT    Antimicrobial dosage has been changed to:  cefazolin 1g IV q12h  Additional comments:   Thank you for allowing pharmacy to be a part of this patient's care.  Rexford Maus, PharmD, BCPS 05/06/2023 3:10 PM

## 2023-05-06 NOTE — Progress Notes (Addendum)
Mobility Specialist Progress Note:   05/06/23 1045  Mobility  Activity Ambulated with assistance to bathroom  Level of Assistance Minimal assist, patient does 75% or more  Assistive Device Front wheel walker  Distance Ambulated (ft) 15 ft  Activity Response Tolerated well  Mobility Referral Yes  Mobility visit 1 Mobility  Mobility Specialist Start Time (ACUTE ONLY) 1040  Mobility Specialist Stop Time (ACUTE ONLY) 1045  Mobility Specialist Time Calculation (min) (ACUTE ONLY) 5 min   Pt received in bed, requesting assistance to bathroom. Required MinA to stand, MinG during ambulation. Tolerated well, TDWB on LLE. Left in bathroom with wife in room, encouraged to pull bathroom light when finished.   Feliciana Rossetti Mobility Specialist Please contact via Special educational needs teacher or  Rehab office at [phone]

## 2023-05-07 ENCOUNTER — Inpatient Hospital Stay (HOSPITAL_COMMUNITY): Payer: Medicare Other

## 2023-05-07 LAB — CBC
HCT: 26.2 % — ABNORMAL LOW (ref 39.0–52.0)
Hemoglobin: 9.1 g/dL — ABNORMAL LOW (ref 13.0–17.0)
MCH: 31 pg (ref 26.0–34.0)
MCHC: 34.7 g/dL (ref 30.0–36.0)
MCV: 89.1 fL (ref 80.0–100.0)
Platelets: 127 10*3/uL — ABNORMAL LOW (ref 150–400)
RBC: 2.94 MIL/uL — ABNORMAL LOW (ref 4.22–5.81)
RDW: 12.7 % (ref 11.5–15.5)
WBC: 5.3 10*3/uL (ref 4.0–10.5)
nRBC: 0 % (ref 0.0–0.2)

## 2023-05-07 LAB — BASIC METABOLIC PANEL
Anion gap: 11 (ref 5–15)
BUN: 90 mg/dL — ABNORMAL HIGH (ref 8–23)
CO2: 23 mmol/L (ref 22–32)
Calcium: 8.9 mg/dL (ref 8.9–10.3)
Chloride: 109 mmol/L (ref 98–111)
Creatinine, Ser: 4.71 mg/dL — ABNORMAL HIGH (ref 0.61–1.24)
GFR, Estimated: 13 mL/min — ABNORMAL LOW (ref >60)
Glucose, Bld: 142 mg/dL — ABNORMAL HIGH (ref 70–99)
Potassium: 3.4 mmol/L — ABNORMAL LOW (ref 3.5–5.1)
Sodium: 143 mmol/L (ref 135–145)

## 2023-05-07 MED ORDER — POTASSIUM CHLORIDE CRYS ER 20 MEQ PO TBCR
20.0000 meq | EXTENDED_RELEASE_TABLET | Freq: Once | ORAL | Status: AC
  Administered 2023-05-07: 20 meq via ORAL
  Filled 2023-05-07: qty 1

## 2023-05-07 MED ORDER — CARVEDILOL 12.5 MG PO TABS
12.5000 mg | ORAL_TABLET | Freq: Two times a day (BID) | ORAL | Status: DC
Start: 2023-05-07 — End: 2023-05-10
  Administered 2023-05-07 – 2023-05-10 (×7): 12.5 mg via ORAL
  Filled 2023-05-07 (×7): qty 1

## 2023-05-07 NOTE — Progress Notes (Signed)
Mobility Specialist Progress Note;   05/07/23 1335  Mobility  Activity Ambulated with assistance in hallway  Level of Assistance Contact guard assist, steadying assist  Assistive Device Front wheel walker  Distance Ambulated (ft) 350 ft  Activity Response Tolerated well  Mobility Referral Yes  Mobility visit 1 Mobility  Mobility Specialist Start Time (ACUTE ONLY) 1335  Mobility Specialist Stop Time (ACUTE ONLY) 1352  Mobility Specialist Time Calculation (min) (ACUTE ONLY) 17 min   Pt eager for mobility. Required MinG assistance for STS and during ambulation for safety. C/o LLE pain, but not horrible. Pt and wife states he seems to be progressing well. Pt returned back to bed with all needs met. Wife in room.   Jeremy Grant Mobility Specialist Please contact via [employer] or [employer] [phone]

## 2023-05-07 NOTE — Progress Notes (Signed)
HD#3 SUBJECTIVE:  Patient Summary: Jeremy Grant is a 62 year old male with a past medical history of microscopic polyangiitis, stage IV CKD, and multiple CVAs who presented to [facility] ED on 12/21 with a L leg injury and was admitted to the Internal Medicine Teaching Service for lower left leg hematoma.   Overnight Events:  Patient refused his sq Lovenox for VTE prophylaxis.   Interim History:  Patient states he is feeling well this morning. Does not have any acute pain, only has some tenderness along drain site with palpation. Has been eating and drinking without any problems. Discussed importance of VTE prophylaxis, with patient stating concerns with medication and kidney function. Clarified lower dosing of Lovenox, with patient preferring to discuss further with wife before making a decision, and his preference for oral vs injection. Agreeable to continue with SCDs at this time. Patient has not received any updates regarding scheduling for his procedure at the end of this week.  OBJECTIVE:  Vital Signs: Vitals:   05/06/23 1648 05/06/23 2118 05/07/23 0332 05/07/23 0724  BP: 138/79 (!) 163/86 (!) 162/87 (!) 168/87  Pulse: 72 88 81 80  Resp: 18 18 18 18   Temp: 98.1 F (36.7 C) 98.5 F (36.9 C) 98.4 F (36.9 C) 98.1 F (36.7 C)  TempSrc:  Oral Oral Oral  SpO2: 99% 99% 99% 98%  Weight:      Height:          Latest Ref Rng & Units 05/07/2023    5:40 AM 05/06/2023    5:51 AM 05/05/2023    5:48 AM  CBC  WBC 4.0 - 10.5 K/uL 5.3  7.0  5.5   Hemoglobin 13.0 - 17.0 g/dL 9.1  9.5  95.2   Hematocrit 39.0 - 52.0 % 26.2  27.4  29.1   Platelets 150 - 400 K/uL 127  120  114        Latest Ref Rng & Units 05/07/2023    5:40 AM 05/06/2023    5:51 AM 05/05/2023    5:48 AM  BMP  Glucose 70 - 99 mg/dL 841  324  401   BUN 8 - 23 mg/dL 90  95  027   Creatinine 0.61 - 1.24 mg/dL 2.53  6.64  4.03   Sodium 135 - 145 mmol/L 143  141  139   Potassium 3.5 - 5.1 mmol/L 3.4  3.7  3.7   Chloride  98 - 111 mmol/L 109  107  103   CO2 22 - 32 mmol/L 23  23  20    Calcium 8.9 - 10.3 mg/dL 8.9  8.9  8.8     Supplemental O2: Room Air SpO2: 98 % O2 Flow Rate (L/min): 0 L/min  Filed Weights   05/04/23 0324 05/04/23 1631  Weight: 72.5 kg 68.7 kg    Intake/Output Summary (Last 24 hours) at 05/07/2023 0919 Last data filed at 05/07/2023 4742 Gross per 24 hour  Intake --  Output 900 ml  Net -900 ml   Net IO Since Admission: -925 mL [05/07/23 0919]  Physical Exam: Physical Exam Constitutional:      General: He is not in acute distress.    Appearance: Normal appearance. He is not ill-appearing.  Cardiovascular:     Rate and Rhythm: Normal rate and regular rhythm.  Pulmonary:     Effort: Pulmonary effort is normal.  Musculoskeletal:     Right lower leg: No edema.     Left lower leg: No edema.  Comments: Left lower extremity covered in dressing from procedure. Drain is in place, wound vac working with serosanguinous output. Slightly tender to palpation around drain site. Patient able to move both lower extremities spontaneously and when prompted.    Skin:    General: Skin is warm.  Neurological:     General: No focal deficit present.     Mental Status: He is alert.  Psychiatric:        Mood and Affect: Mood normal.        Behavior: Behavior normal.    Patient Lines/Drains/Airways Status     Active Line/Drains/Airways     Name Placement date Placement time Site Days   Peripheral IV 05/04/23 20 G Left Antecubital 05/04/23  0440  Antecubital  1   Wound / Incision (Open or Dehisced) 09/17/22 Puncture Lumbar Lateral;Right renal biopsy 09/17/22  0850  Lumbar  230            ASSESSMENT/PLAN:  Assessment: Principal Problem:   Traumatic hematoma of left lower leg Active Problems:   Seizures (HCC)   ANCA-associated vasculitis (HCC)   Hypertension   Thrombocytopenia (HCC)   CKD (chronic kidney disease) stage 4, GFR 15-29 ml/min (HCC)   Hematoma  Jeremy Grant  is a 62 yo with pertinent PMH of microscopic polyangiitis, stage IV CKD, and multiple CVAs who presented with L leg injury and was admitted for traumatic hematoma of the left lower leg.    Plan: Traumatic hematoma of left lower leg Patient continues to do well s/p debridement on 12/22. Continues to deny acute pain. Doing well on antibiotic treatment, which pharmacy adjusted for CrCl. Patient will get ankle brachial indices today to obtain a baseline, per orthopedic surgery. No updates regarding scheduling of the procedure, will check in with orthopedic surgery.   - Pain management: continue oxycodone 5 mg po every 4h as needed, Dilaudid 0.5 mg IV every 4h as needed for breakthrough - Bowel regimen: Miralax daily  - IV Cefazolin 1g BID (changed from TID) - Daily labs - Continue tizanidine 4 mg q8H as needed for muscle spasms - Follow up ABIs  Microscopic Polyangiitis CKD stage 4 History of microscopic polyangiitis requiring hemodialysis in the past, not currently on HD. Creatinine function seems to be progressively worsening over the last year. Creatinine today 4.71 (4.76 on admission). Continue home medications and monitoring with daily labs.  - Continue to encourage oral hydration  - Continue home prednisone 5 mg daily and azathioprine 50 mg daily  HTN Chronic. Mostly normotensive since admission with elevated BP to 160s systolic today. On home amlodipine, coreg, and doxazosin which will be resumed as appropriate.  - Continue amlodipine 10 mg daily  - START Coreg 12.5 mg BID    History of stroke Residual weakness of left lower extremity from stroke in 2020. Holding home aspirin 81 mg in the setting of easy bleeding, hematoma, and surgical intervention. Continue home atorvastatin 80 mg.   HLD Continued home atorvastatin 80 mg daily on admission.   Best Practice: Diet: Regular  IVF: oral rehydration VTE: SCDs  Code: Full AB: None Family Contact: Jeremy Grant (wife)  [phone] DISPO: Anticipated discharge pending surgical debridement   Signature: Cassell Voorhies Colbert Coyer, MD  Internal Medicine Resident, PGY-1 [facility]  9:19 AM, 05/07/2023   Please contact the on call pager after 5 pm and on weekends at [phone].

## 2023-05-08 ENCOUNTER — Encounter (HOSPITAL_COMMUNITY): Payer: Medicare Other

## 2023-05-08 DIAGNOSIS — T148XXA Other injury of unspecified body region, initial encounter: Secondary | ICD-10-CM | POA: Diagnosis not present

## 2023-05-08 DIAGNOSIS — I776 Arteritis, unspecified: Secondary | ICD-10-CM | POA: Diagnosis not present

## 2023-05-08 LAB — CBC
HCT: 26.5 % — ABNORMAL LOW (ref 39.0–52.0)
Hemoglobin: 9.1 g/dL — ABNORMAL LOW (ref 13.0–17.0)
MCH: 31 pg (ref 26.0–34.0)
MCHC: 34.3 g/dL (ref 30.0–36.0)
MCV: 90.1 fL (ref 80.0–100.0)
Platelets: 135 10*3/uL — ABNORMAL LOW (ref 150–400)
RBC: 2.94 MIL/uL — ABNORMAL LOW (ref 4.22–5.81)
RDW: 12.7 % (ref 11.5–15.5)
WBC: 5.3 10*3/uL (ref 4.0–10.5)
nRBC: 0 % (ref 0.0–0.2)

## 2023-05-08 LAB — BASIC METABOLIC PANEL
Anion gap: 12 (ref 5–15)
BUN: 85 mg/dL — ABNORMAL HIGH (ref 8–23)
CO2: 23 mmol/L (ref 22–32)
Calcium: 8.8 mg/dL — ABNORMAL LOW (ref 8.9–10.3)
Chloride: 108 mmol/L (ref 98–111)
Creatinine, Ser: 4.33 mg/dL — ABNORMAL HIGH (ref 0.61–1.24)
GFR, Estimated: 15 mL/min — ABNORMAL LOW (ref >60)
Glucose, Bld: 144 mg/dL — ABNORMAL HIGH (ref 70–99)
Potassium: 3.6 mmol/L (ref 3.5–5.1)
Sodium: 143 mmol/L (ref 135–145)

## 2023-05-08 MED ORDER — DOXAZOSIN MESYLATE 4 MG PO TABS
4.0000 mg | ORAL_TABLET | Freq: Two times a day (BID) | ORAL | Status: DC
  Administered 2023-05-08 – 2023-05-09 (×4): 4 mg via ORAL
  Filled 2023-05-08 (×5): qty 1

## 2023-05-08 NOTE — Progress Notes (Signed)
HD#4 SUBJECTIVE:  Patient Summary: [facility] is a 62 year old male with a past medical history of microscopic polyangiitis, stage IV CKD, and multiple CVAs who presented to [facility] on 12/21 with a L leg injury and was admitted to the Internal Medicine Teaching Service for lower left leg hematoma.   Overnight Events:  No overnight events reported.   Interim History:  Patient continues to feel well this morning. Denies acute pain, still slightly tender along drain site on left lower extremity with palpation. States he has been moving around the room and walking in the hall with assistance with no issues and minimal LLE discomfort. Still agreeable to SCD on right leg. Provided update from orthopedic surgery office regarding possible surgery with Dr. Christell Constant tomorrow. Confirmed doxazosin dose at home is 8 mg BID (also in paperwork at bedside). Per patient, ABIs not completed yesterday due to dressings/surgical drain in place.   OBJECTIVE:  Vital Signs: Vitals:   05/07/23 2234 05/08/23 0039 05/08/23 0408 05/08/23 0700  BP: (!) 124/58 (!) 160/80 (!) 172/80 (!) 174/80  Pulse: 60 63 (!) 59 64  Resp: 18 18 18 18   Temp: 98.6 F (37 C) 98.6 F (37 C) 98.3 F (36.8 C) 98.1 F (36.7 C)  TempSrc: Oral Oral Oral Oral  SpO2: 99% 99% 100% 99%  Weight:      Height:          Latest Ref Rng & Units 05/08/2023    4:27 AM 05/07/2023    5:40 AM 05/06/2023    5:51 AM  CBC  WBC 4.0 - 10.5 K/uL 5.3  5.3  7.0   Hemoglobin 13.0 - 17.0 g/dL 9.1  9.1  9.5   Hematocrit 39.0 - 52.0 % 26.5  26.2  27.4   Platelets 150 - 400 K/uL 135  127  120        Latest Ref Rng & Units 05/08/2023    4:27 AM 05/07/2023    5:40 AM 05/06/2023    5:51 AM  BMP  Glucose 70 - 99 mg/dL 045  409  811   BUN 8 - 23 mg/dL 85  90  95   Creatinine 0.61 - 1.24 mg/dL 9.14  7.82  9.56   Sodium 135 - 145 mmol/L 143  143  141   Potassium 3.5 - 5.1 mmol/L 3.6  3.4  3.7   Chloride 98 - 111 mmol/L 108  109  107   CO2 22 - 32  mmol/L 23  23  23    Calcium 8.9 - 10.3 mg/dL 8.8  8.9  8.9     Supplemental O2: Room Air SpO2: 99 % O2 Flow Rate (L/min): 0 L/min  Filed Weights   05/04/23 0324 05/04/23 1631  Weight: 72.5 kg 68.7 kg    Intake/Output Summary (Last 24 hours) at 05/08/2023 0901 Last data filed at 05/08/2023 0600 Gross per 24 hour  Intake --  Output 1325 ml  Net -1325 ml   Net IO Since Admission: -2,250 mL [05/08/23 0901]  Physical Exam: Physical Exam Constitutional:      General: He is not in acute distress.    Appearance: Normal appearance. He is not ill-appearing.  Pulmonary:     Effort: Pulmonary effort is normal.  Musculoskeletal:     Right lower leg: No edema.     Left lower leg: No edema.     Comments: Left lower extremity still cleanly wrapped in dressing. Drain in place, wound vac with serosanguinous output. Some tenderness  around drain site with palpation. Continues to move all extremities spontaneously and on command without pain.   Skin:    General: Skin is warm.  Neurological:     General: No focal deficit present.     Mental Status: He is alert.  Psychiatric:        Mood and Affect: Mood normal.        Behavior: Behavior normal.    Patient Lines/Drains/Airways Status     Active Line/Drains/Airways     Name Placement date Placement time Site Days   Peripheral IV 05/04/23 20 G Left Antecubital 05/04/23  0440  Antecubital  1   Wound / Incision (Open or Dehisced) 09/17/22 Puncture Lumbar Lateral;Right renal biopsy 09/17/22  0850  Lumbar  230            ASSESSMENT/PLAN:  Assessment: Principal Problem:   Traumatic hematoma of left lower leg Active Problems:   Seizures (HCC)   ANCA-associated vasculitis (HCC)   Hypertension   Thrombocytopenia (HCC)   CKD (chronic kidney disease) stage 4, GFR 15-29 ml/min (HCC)   Hematoma  [facility] is a 62 yo with pertinent PMH of microscopic polyangiitis, stage IV CKD, and multiple CVAs who presented with L leg injury  and was admitted for traumatic hematoma of the left lower leg.    Plan: Traumatic hematoma of left lower leg Patient continues to do well s/p debridement on 12/22. Denies acute pain at rest, minimal pain in LLE with ambulaton. Still doing well with antibiotic treatment. Ankle brachial indices ordered by orthopedic surgery not completed yesterday due to dressing/drain/wound vac in place. Per orthopedic surgery office surgery most likely tomorrow with Dr. Christell Constant. Will make NPO at midnight.  - Pain management: continue oxycodone 5 mg po every 4h as needed, Dilaudid 0.5 mg IV every 4h as needed for breakthrough - Bowel regimen: Miralax daily  - IV Cefazolin 1g BID - Daily labs - Continue tizanidine 4 mg q8H as needed for muscle spasms - NPO at midnight  Microscopic Polyangiitis CKD stage 4 History of microscopic polyangiitis requiring hemodialysis in the past, not currently on HD. Creatinine function seems to be progressively worsening over the last year. Creatinine today 4.33 (4.76 on admission). Continue to encourage oral hydration, home medications, and monitoring with daily labs.  - Continue home prednisone 5 mg daily and azathioprine 50 mg daily  HTN Chronic. Mostly normotensive on admission, with BP more persistently elevated to 160s-170s systolic and 80s diastolic today. On home amlodipine, coreg, and doxazosin.  - Continue amlodipine 10 mg daily and Coreg 12.5 mg BID  - START doxazosin 4 mg BID, increase to home dose 8 mg BID as indicated   History of stroke Residual weakness of left lower extremity from stroke in 2020. Holding home aspirin 81 mg in the setting of easy bleeding, hematoma, and surgical intervention. Continue home atorvastatin 80 mg.   HLD Continued home atorvastatin 80 mg daily on admission.   Best Practice: Diet: Regular, NPO at midnight IVF: oral rehydration VTE: SCDs  Code: Full AB: None Family Contact: Riaz Shostak (wife) [phone] DISPO: Anticipated  discharge pending surgical debridement   Signature: Shatana Saxton Colbert Coyer, MD  Internal Medicine Resident, PGY-1 [facility]  9:01 AM, 05/08/2023   Please contact the on call pager after 5 pm and on weekends at [phone].

## 2023-05-08 NOTE — Plan of Care (Signed)

## 2023-05-09 ENCOUNTER — Other Ambulatory Visit: Payer: Self-pay

## 2023-05-09 ENCOUNTER — Encounter (HOSPITAL_COMMUNITY)
Admission: EM | Disposition: A | Discharge status: Home / Self Care | Payer: Self-pay | Source: Home / Self Care | Attending: Infectious Diseases

## 2023-05-09 ENCOUNTER — Encounter (HOSPITAL_COMMUNITY): Payer: Self-pay | Admitting: Student in an Organized Health Care Education/Training Program

## 2023-05-09 ENCOUNTER — Inpatient Hospital Stay (HOSPITAL_COMMUNITY): Payer: Medicare Other | Admitting: Certified Registered Nurse Anesthetist

## 2023-05-09 DIAGNOSIS — T148XXA Other injury of unspecified body region, initial encounter: Secondary | ICD-10-CM

## 2023-05-09 DIAGNOSIS — S8012XA Contusion of left lower leg, initial encounter: Secondary | ICD-10-CM | POA: Diagnosis not present

## 2023-05-09 DIAGNOSIS — I110 Hypertensive heart disease with heart failure: Secondary | ICD-10-CM | POA: Diagnosis not present

## 2023-05-09 DIAGNOSIS — S7012XA Contusion of left thigh, initial encounter: Secondary | ICD-10-CM

## 2023-05-09 DIAGNOSIS — I509 Heart failure, unspecified: Secondary | ICD-10-CM

## 2023-05-09 HISTORY — PX: I & D EXTREMITY: SHX5045

## 2023-05-09 LAB — CBC
HCT: 25.8 % — ABNORMAL LOW (ref 39.0–52.0)
Hemoglobin: 8.9 g/dL — ABNORMAL LOW (ref 13.0–17.0)
MCH: 31 pg (ref 26.0–34.0)
MCHC: 34.5 g/dL (ref 30.0–36.0)
MCV: 89.9 fL (ref 80.0–100.0)
Platelets: 121 10*3/uL — ABNORMAL LOW (ref 150–400)
RBC: 2.87 MIL/uL — ABNORMAL LOW (ref 4.22–5.81)
RDW: 12.5 % (ref 11.5–15.5)
WBC: 5 10*3/uL (ref 4.0–10.5)
nRBC: 0 % (ref 0.0–0.2)

## 2023-05-09 LAB — BASIC METABOLIC PANEL
Anion gap: 11 (ref 5–15)
BUN: 86 mg/dL — ABNORMAL HIGH (ref 8–23)
CO2: 23 mmol/L (ref 22–32)
Calcium: 8.7 mg/dL — ABNORMAL LOW (ref 8.9–10.3)
Chloride: 108 mmol/L (ref 98–111)
Creatinine, Ser: 3.95 mg/dL — ABNORMAL HIGH (ref 0.61–1.24)
GFR, Estimated: 16 mL/min — ABNORMAL LOW (ref >60)
Glucose, Bld: 134 mg/dL — ABNORMAL HIGH (ref 70–99)
Potassium: 3.4 mmol/L — ABNORMAL LOW (ref 3.5–5.1)
Sodium: 142 mmol/L (ref 135–145)

## 2023-05-09 SURGERY — IRRIGATION AND DEBRIDEMENT EXTREMITY
Anesthesia: General | Site: Leg Lower | Laterality: Left

## 2023-05-09 MED ORDER — FENTANYL CITRATE (PF) 100 MCG/2ML IJ SOLN
INTRAMUSCULAR | Status: AC
  Filled 2023-05-09: qty 2

## 2023-05-09 MED ORDER — TRANEXAMIC ACID-NACL 1000-0.7 MG/100ML-% IV SOLN
INTRAVENOUS | Status: AC
  Filled 2023-05-09: qty 100

## 2023-05-09 MED ORDER — MIDAZOLAM HCL 2 MG/2ML IJ SOLN
INTRAMUSCULAR | Status: DC | PRN
  Administered 2023-05-09: 2 mg via INTRAVENOUS

## 2023-05-09 MED ORDER — LIDOCAINE 2% (20 MG/ML) 5 ML SYRINGE
INTRAMUSCULAR | Status: AC
  Filled 2023-05-09: qty 5

## 2023-05-09 MED ORDER — CHLORHEXIDINE GLUCONATE 0.12 % MT SOLN
OROMUCOSAL | Status: AC
  Administered 2023-05-09: 15 mL via OROMUCOSAL
  Filled 2023-05-09: qty 15

## 2023-05-09 MED ORDER — ONDANSETRON HCL 4 MG/2ML IJ SOLN
INTRAMUSCULAR | Status: AC
  Filled 2023-05-09: qty 2

## 2023-05-09 MED ORDER — ACETAMINOPHEN 10 MG/ML IV SOLN
1000.0000 mg | Freq: Once | INTRAVENOUS | Status: DC | PRN
Start: 2023-05-09 — End: 2023-05-09
  Administered 2023-05-09: 1000 mg via INTRAVENOUS

## 2023-05-09 MED ORDER — OXYCODONE HCL 5 MG PO TABS: 5.0000 mg | ORAL_TABLET | Freq: Once | ORAL | Status: DC | PRN

## 2023-05-09 MED ORDER — PROPOFOL 10 MG/ML IV BOLUS
INTRAVENOUS | Status: AC
  Filled 2023-05-09: qty 20

## 2023-05-09 MED ORDER — PROPOFOL 10 MG/ML IV BOLUS
INTRAVENOUS | Status: DC | PRN
  Administered 2023-05-09: 120 mg via INTRAVENOUS

## 2023-05-09 MED ORDER — ORAL CARE MOUTH RINSE
15.0000 mL | Freq: Once | OROMUCOSAL | Status: AC
Start: 2023-05-09 — End: 2023-05-09

## 2023-05-09 MED ORDER — ACETAMINOPHEN 10 MG/ML IV SOLN
INTRAVENOUS | Status: AC
  Filled 2023-05-09: qty 100

## 2023-05-09 MED ORDER — FENTANYL CITRATE (PF) 100 MCG/2ML IJ SOLN
25.0000 ug | INTRAMUSCULAR | Status: DC | PRN
  Administered 2023-05-09: 50 ug via INTRAVENOUS

## 2023-05-09 MED ORDER — PHENYLEPHRINE 80 MCG/ML (10ML) SYRINGE FOR IV PUSH (FOR BLOOD PRESSURE SUPPORT)
PREFILLED_SYRINGE | INTRAVENOUS | Status: DC | PRN
  Administered 2023-05-09 (×2): 160 ug via INTRAVENOUS

## 2023-05-09 MED ORDER — OXYCODONE HCL 5 MG/5ML PO SOLN: 5.0000 mg | Freq: Once | ORAL | Status: DC | PRN

## 2023-05-09 MED ORDER — TRANEXAMIC ACID-NACL 1000-0.7 MG/100ML-% IV SOLN
1000.0000 mg | INTRAVENOUS | Status: AC
  Administered 2023-05-09: 1000 mg via INTRAVENOUS

## 2023-05-09 MED ORDER — MIDAZOLAM HCL 2 MG/2ML IJ SOLN
INTRAMUSCULAR | Status: AC
Start: 2023-05-09 — End: ?
  Filled 2023-05-09: qty 2

## 2023-05-09 MED ORDER — CHLORHEXIDINE GLUCONATE 0.12 % MT SOLN: 15.0000 mL | Freq: Once | OROMUCOSAL | Status: AC

## 2023-05-09 MED ORDER — LIDOCAINE 2% (20 MG/ML) 5 ML SYRINGE
INTRAMUSCULAR | Status: DC | PRN
  Administered 2023-05-09: 60 mg via INTRAVENOUS

## 2023-05-09 MED ORDER — DROPERIDOL 2.5 MG/ML IJ SOLN: 0.6250 mg | Freq: Once | INTRAMUSCULAR | Status: DC | PRN

## 2023-05-09 MED ORDER — SODIUM CHLORIDE 0.9 % IV SOLN: INTRAVENOUS | Status: DC

## 2023-05-09 MED ORDER — POTASSIUM CHLORIDE CRYS ER 20 MEQ PO TBCR
20.0000 meq | EXTENDED_RELEASE_TABLET | Freq: Once | ORAL | Status: AC
  Administered 2023-05-09: 20 meq via ORAL
  Filled 2023-05-09: qty 1

## 2023-05-09 MED ORDER — 0.9 % SODIUM CHLORIDE (POUR BTL) OPTIME
TOPICAL | Status: DC | PRN
  Administered 2023-05-09: 3000 mL

## 2023-05-09 MED ORDER — ONDANSETRON HCL 4 MG/2ML IJ SOLN
INTRAMUSCULAR | Status: DC | PRN
  Administered 2023-05-09: 4 mg via INTRAVENOUS

## 2023-05-09 SURGICAL SUPPLY — 38 items
ALCOHOL 70% 16 OZ (MISCELLANEOUS) ×1 IMPLANT
BAG COUNTER SPONGE SURGICOUNT (BAG) ×1 IMPLANT
BNDG COHESIVE 4X5 TAN STRL LF (GAUZE/BANDAGES/DRESSINGS) IMPLANT
COVER SURGICAL LIGHT HANDLE (MISCELLANEOUS) ×1 IMPLANT
DRAPE C-ARM 42X72 X-RAY (DRAPES) IMPLANT
DRAPE IMP U-DRAPE 54X76 (DRAPES) ×1 IMPLANT
DRAPE U-SHAPE 47X51 STRL (DRAPES) ×1 IMPLANT
DRESSING VERAFLO CLEANS CC MED (GAUZE/BANDAGES/DRESSINGS) IMPLANT
DRSG VERAFLO CLEANSE CC MED (GAUZE/BANDAGES/DRESSINGS) ×1 IMPLANT
DURAPREP 26ML APPLICATOR (WOUND CARE) ×1 IMPLANT
ELECT REM PT RETURN 9FT ADLT (ELECTROSURGICAL) ×1 IMPLANT
ELECTRODE REM PT RTRN 9FT ADLT (ELECTROSURGICAL) ×1 IMPLANT
GAUZE PAD ABD 8X10 STRL (GAUZE/BANDAGES/DRESSINGS) IMPLANT
GLOVE BIO SURGEON STRL SZ7.5 (GLOVE) ×1 IMPLANT
GLOVE INDICATOR 7.5 STRL GRN (GLOVE) ×1 IMPLANT
GOWN STRL REUS W/ TWL XL LVL3 (GOWN DISPOSABLE) ×1 IMPLANT
GRAFT SKIN WND SURGICLOSE M95 (Tissue) IMPLANT
KIT BASIN OR (CUSTOM PROCEDURE TRAY) ×1 IMPLANT
KIT DRSG PREVENA PLUS 7DAY 125 (MISCELLANEOUS) IMPLANT
KIT TURNOVER KIT B (KITS) ×1 IMPLANT
MANIFOLD NEPTUNE II (INSTRUMENTS) ×1 IMPLANT
NDL 22X1.5 STRL (OR ONLY) (MISCELLANEOUS) IMPLANT
NEEDLE 22X1.5 STRL (OR ONLY) (MISCELLANEOUS) IMPLANT
NS IRRIG 1000ML POUR BTL (IV SOLUTION) ×1 IMPLANT
PACK ORTHO EXTREMITY (CUSTOM PROCEDURE TRAY) ×1 IMPLANT
PAD ARMBOARD 7.5X6 YLW CONV (MISCELLANEOUS) ×1 IMPLANT
RESTRAINT HEAD UNIVERSAL NS (MISCELLANEOUS) ×1 IMPLANT
SPONGE T-LAP 18X18 ~~LOC~~+RFID (SPONGE) ×2 IMPLANT
SPONGE T-LAP 4X18 ~~LOC~~+RFID (SPONGE) IMPLANT
STAPLER VISISTAT 35W (STAPLE) ×1 IMPLANT
STOCKINETTE IMPERVIOUS 9X36 MD (GAUZE/BANDAGES/DRESSINGS) IMPLANT
SUCTION TUBE FRAZIER 10FR DISP (SUCTIONS) IMPLANT
SUT VIC AB 0 CT1 18XCR BRD 8 (SUTURE) ×1 IMPLANT
SUT VIC AB 2-0 CT1 18 (SUTURE) ×1 IMPLANT
TOWEL GREEN STERILE (TOWEL DISPOSABLE) ×1 IMPLANT
TOWEL GREEN STERILE FF (TOWEL DISPOSABLE) ×1 IMPLANT
WATER STERILE IRR 1000ML POUR (IV SOLUTION) ×1 IMPLANT
YANKAUER SUCT BULB TIP NO VENT (SUCTIONS) ×1 IMPLANT

## 2023-05-09 NOTE — Progress Notes (Signed)
Orthopedic Surgery Progress Note   Assessment: Patient is a 62 y.o. male with left calf hematoma s/p I&D   Plan: -Plan for repeat debridement this evening -Can eat until 9am, then NPO  -Weight bearing status: as tolerated  -PT evaluate and treat -Pain control -Dispo: pending completion of operative plans  ___________________________________________________________________________  Subjective: No acute events overnight. Pain controlled. Understands plan for repeat surgery this evening. All his questions were answered to his satisfaction.    Physical Exam:  General: no acute distress, appears stated age Neurologic: alert, answering questions appropriately, following commands Respiratory: unlabored breathing on room air, symmetric chest rise  MSK:   -Left lower extremity  Wound vac in place with good suction, no evidence of leak  EHL/TA/GSC Plantarflexes and dorsiflexes toes Sensation intact to light touch in sural, saphenous, tibial, deep peroneal, and superficial peroneal nerve distributions Foot warm and well perfused   Patient name: [facility] Patient MRN: [id number] Date: 05/09/23

## 2023-05-09 NOTE — Op Note (Addendum)
Orthopedic Surgery Operative Report  Procedure: Left leg hematoma evacuation Use of Kerecis micro graft 95 cm squared Application of negative pressure dressing  Date of procedure: 05/09/2023  Patient name: [facility] MRN: [id number] DOB: 01/27/1961  Surgeon: Willia Craze, MD Assistant: None Pre-operative diagnosis: left leg hematoma and necrotic tissue Post-operative diagnosis: same as above Findings: necrotic poorly vascularized lateral skin, subcutaneous tissue, and one area laterally with necrotic muscle, hematoma underneath necrotic skin flap posteriorly  Specimens: none Anesthesia: general EBL: 30cc Complications: none Pre-incision antibiotic: ancef  Implants: none   Indication for procedure: Patient is a 62 y.o. male who had been admitted to the hospital for a left leg hematoma. He underwent initial debridement with Dr. Lajoyce Corners on 05/05/2023 with planned repeat surgery in a few days. Dr. Lajoyce Corners was going to be out of town so he asked me to do the second hematoma incision and drainage with debridement. Patient has been admitted to a medicine service since his initial debridement. His pain has been controlled. I explained the plan for repeat debridement with vac placement. I went over the risks which included but were not limited to: neurovascular injury, dvt/pe, persistent pain, need for additional procedures, need for further soft tissue coverage, bleeding, infection. I explained that the benefit would be to clear further hematoma and remove any further necrotic material to promote healing. The alternative would be to leave the current vac on and perform no further intervention. After covering the risks, benefits, alternatives, patient elected to proceed.   Procedure Description: The patient was met in the pre-operative holding area. The patient's identity and consent were verified. The operative site was marked. The patient's remaining questions about the surgery were answered.  The patient was brought back to the operating room. General anesthesia was induced. The patient was transferred to the operating table in the supine position. All bony prominences were well padded. The prior wound vac was removed. The patient's skin was then prepped and draped in a standard, sterile fashion. A time out was performed that identified the patient, the procedure, and the laterality. All team members agreed with what was stated in the time out.   The skin over the anterior aspect of the wound appeared healthy.  The posterior aspect of the wound appeared to have necrotic skin.  There was hematoma underlying the flap of skin posteriorly.  A 15 blade was used to sharply excise the necrotic skin along the posterior aspect of the leg.  There was further necrotic appearing skin proximally at the cranial aspect of the wound.  The skin was also sharply excised with a 15 blade.  There were healthy bleeding skin edges at this point.  A curette was used to remove the hematoma laying underneath the necrotic posterior skin flap.  In the posterior aspect of the wound there was loose appearing fascia which was sharply excised with a 15 blade knife. There is a small portion of the lateral compartment musculature with necrotic noncontractile muscle.  A 15 blade was used to sharply excise this portion of muscle.  No purulence was seen in the wound. Except the one small portion of muscle within the lateral compartment, the remainder of the muscle was contractile and healthy appearing. At this point, there was no further loose or necrotic appearing material. No further hematoma was seen. The wound was irrigated with 3L of saline via cysto tubing. 95 square cm of Kerecis micro graft was placed over the wound surface which measured 12x25cm. A cleanse choice  sponge was placed over the wound and covered with derma tack. A track pad was attached and there was good suction with no evidence of leak. Coban was wrapped around the  leg and vac.   Post-operative plan: The patient will recover in the post-anesthesia care unit and then go to the floor. The patient will receive two post-operative doses ancef and then does not need further antibiotic. The patient will be weight bearing as tolerated. He will be transitioned to a home vac unit at discharge. Typically, use 81mg  BID for dvt ppx. Will start that tomorrow. The patient's disposition will be determined by the medicine service. He will need to be seen by Dr. Lajoyce Corners in approximately 1 week.    Willia Craze, MD Orthopedic Surgeon

## 2023-05-09 NOTE — Anesthesia Procedure Notes (Signed)
Procedure Name: LMA Insertion Date/Time: 05/09/2023 5:00 PM  Performed by: Caryn Bee A, CRNAPre-anesthesia Checklist: Patient identified, Emergency Drugs available, Suction available and Patient being monitored Patient Re-evaluated:Patient Re-evaluated prior to induction Oxygen Delivery Method: Circle System Utilized Preoxygenation: Pre-oxygenation with 100% oxygen Induction Type: IV induction Ventilation: Mask ventilation without difficulty LMA: LMA inserted LMA Size: 4.0 Number of attempts: 1 Airway Equipment and Method: Bite block Placement Confirmation: positive ETCO2 Tube secured with: Tape Dental Injury: Teeth and Oropharynx as per pre-operative assessment

## 2023-05-09 NOTE — Progress Notes (Signed)
Orthopedic Surgery Progress Note   Assessment: Patient is a 62 y.o. male with left calf hematoma s/p I&D (x2)   Plan: -Operative plans complete -Regular diet -Weight bearing status: as tolerated  -PT evaluate and treat -Pain control -Normally, would use dvt ppx but per patient and his wife, he cannot have any blood thinners. Encouraged regular ambulation -Dispo: per primary  ___________________________________________________________________________  Subjective: No acute events since surgery. Has made it back to his floor bed. Was having pain, but got pain medication and it is tolerable at the moment.    Physical Exam:  General: no acute distress, appears stated age Neurologic: alert, answering questions appropriately, following commands Respiratory: unlabored breathing on room air, symmetric chest rise  MSK:   -Left lower extremity  Wound vac in place with good suction, no evidence of leak, no drainage in canister EHL/TA/GSC Plantarflexes and dorsiflexes toes Sensation intact to light touch in sural, saphenous, tibial, deep peroneal, and superficial peroneal nerve distributions Foot warm and well perfused   Patient name: [facility] Patient MRN: [id number] Date: 05/09/23

## 2023-05-09 NOTE — Transfer of Care (Signed)
Immediate Anesthesia Transfer of Care Note  Patient: [facility]  Procedure(s) Performed: IRRIGATION AND DEBRIDEMENT OF LEG (Left: Leg Lower)  Patient Location: PACU  Anesthesia Type:General  Level of Consciousness: sedated  Airway & Oxygen Therapy: Patient Spontanous Breathing  Post-op Assessment: Report given to RN and Post -op Vital signs reviewed and stable  Post vital signs: Reviewed and stable  Last Vitals:  Vitals Value Taken Time  BP 138/78 05/09/23 1750  Temp    Pulse 64 05/09/23 1752  Resp 14 05/09/23 1752  SpO2 97 % 05/09/23 1752  Vitals shown include unfiled device data.  Last Pain:  Vitals:   05/09/23 1604  TempSrc: Oral  PainSc: 0-No pain      Patients Stated Pain Goal: 1 (05/09/23 0905)  Complications: No notable events documented.

## 2023-05-09 NOTE — Plan of Care (Signed)
Patient alert/oriented X4. Patient compliant with medication administration and wound vac has drained 35 mL this shift of sanguinous fluid. Patient remained NPO following breakfast for I & D procedure of L leg hematoma. Patient VSS, no complaints at this time.   Problem: Education: Goal: Knowledge of General Education information will improve Description: Including pain rating scale, medication(s)/side effects and non-pharmacologic comfort measures Outcome: Progressing   Problem: Health Behavior/Discharge Planning: Goal: Ability to manage health-related needs will improve Outcome: Progressing   Problem: Clinical Measurements: Goal: Ability to maintain clinical measurements within normal limits will improve Outcome: Progressing   Problem: Clinical Measurements: Goal: Will remain free from infection Outcome: Progressing   Problem: Clinical Measurements: Goal: Diagnostic test results will improve Outcome: Progressing   Problem: Clinical Measurements: Goal: Respiratory complications will improve Outcome: Progressing   Problem: Clinical Measurements: Goal: Cardiovascular complication will be avoided Outcome: Progressing   Problem: Activity: Goal: Risk for activity intolerance will decrease Outcome: Progressing   Problem: Nutrition: Goal: Adequate nutrition will be maintained Outcome: Progressing   Problem: Coping: Goal: Level of anxiety will decrease Outcome: Progressing   Problem: Elimination: Goal: Will not experience complications related to bowel motility Outcome: Progressing   Problem: Elimination: Goal: Will not experience complications related to urinary retention Outcome: Progressing   Problem: Pain Management: Goal: General experience of comfort will improve Outcome: Progressing   Problem: Safety: Goal: Ability to remain free from injury will improve Outcome: Progressing   Problem: Skin Integrity: Goal: Risk for impaired skin integrity will  decrease Outcome: Progressing

## 2023-05-09 NOTE — TOC Initial Note (Signed)
Transition of Care [facility]) - Initial/Assessment Note    Patient Details  Name: [facility] MRN: [id number] Date of Birth: 1961-03-18  [facility] CM/SW Contact:    Harriet Masson, RN Phone Number: 05/09/2023, 10:13 AM  Clinical Narrative:                  Spoke to patient regarding transition needs.  Patient states he has all needed DME. Surgery planned for this afternoon and plan to discharge tomorrow with prevena wound vac tomorrow. PCP confirmed.  TOC following.  Expected Discharge Plan: Home/Self Care Barriers to Discharge: Continued Medical Work up   Patient Goals and CMS Choice Patient states their goals for this hospitalization and ongoing recovery are:: return home          Expected Discharge Plan and Services   Discharge Planning Services: CM Consult   Living arrangements for the past 2 months: Single Family Home                                      Prior Living Arrangements/Services Living arrangements for the past 2 months: Single Family Home Lives with:: Spouse Patient language and need for interpreter reviewed:: Yes Do you feel safe going back to the place where you live?: Yes      Need for Family Participation in Patient Care: Yes (Comment) Care giver support system in place?: Yes (comment) Current home services: DME Criminal Activity/Legal Involvement Pertinent to Current Situation/Hospitalization: No - Comment as needed  Activities of Daily Living   ADL Screening (condition at time of admission) Independently performs ADLs?: No Does the patient have a NEW difficulty with bathing/dressing/toileting/self-feeding that is expected to last >3 days?: No Does the patient have a NEW difficulty with getting in/out of bed, walking, or climbing stairs that is expected to last >3 days?: No Does the patient have a NEW difficulty with communication that is expected to last >3 days?: No Is the patient deaf or have difficulty  hearing?: No Does the patient have difficulty seeing, even when wearing glasses/contacts?: No Does the patient have difficulty concentrating, remembering, or making decisions?: No  Permission Sought/Granted                  Emotional Assessment Appearance:: Appears stated age Attitude/Demeanor/Rapport: Engaged Affect (typically observed): Accepting Orientation: : Oriented to Self, Oriented to Place, Oriented to  Time, Oriented to Situation Alcohol / Substance Use: Not Applicable Psych Involvement: No (comment)  Admission diagnosis:  Hematoma [T14.8XXA] Traumatic hematoma of left lower leg [S80.12XA] Patient Active Problem List   Diagnosis Date Noted   Hematoma 05/06/2023   Traumatic hematoma of left lower leg 05/04/2023   CKD (chronic kidney disease) stage 4, GFR 15-29 ml/min (HCC) 05/04/2023   Essential hypertension 05/25/2019   Spasm 03/23/2019   Steroid-induced hyperglycemia    Acute on chronic anemia    Seizures (HCC)    Spastic hemiparesis (HCC)    Vasculitis (HCC)    Hypertension    Thrombocytopenia (HCC)    Elevated serum protein level    Multiple myeloma (HCC)    Pancytopenia (HCC)    Normocytic normochromic anemia 07/16/2018   NICM (nonischemic cardiomyopathy) (HCC) 11/12/2013   History of ETOH abuse 11/12/2013   Chronic combined systolic and diastolic CHF (congestive heart failure) (HCC) 11/12/2013   Congestive dilated cardiomyopathy (HCC) 11/04/2013   PCP:  Dois Davenport, MD Pharmacy:  [facility] - [place], [place] - [address] [place] [address] Phone: [phone] Fax: [phone]     Social Drivers of Health (SDOH) Social History: SDOH Screenings   Food Insecurity: No Food Insecurity (05/04/2023)  Housing: Low Risk  (05/04/2023)  Transportation Needs: No Transportation Needs (05/04/2023)  Utilities: Not At Risk (05/04/2023)  Depression (PHQ2-9): Medium Risk (04/21/2020)   Physical Activity: Sufficiently Active (07/17/2018)  Social Connections: Somewhat Isolated (07/17/2018)  Stress: No Stress Concern Present (07/17/2018)  Tobacco Use: Low Risk  (05/04/2023)   SDOH Interventions:     Readmission Risk Interventions     No data to display

## 2023-05-09 NOTE — H&P (Signed)
Orthopedic Surgery H&P Update  Patient's history and physical reviewed - no updates at this time Risks of surgery were covered again, patient elected to continue with planned procedure Written consent verified Site marked Ancef and TXA on call to OR To OR when ready  Willia Craze, MD Orthopedic Surgeon

## 2023-05-09 NOTE — Anesthesia Preprocedure Evaluation (Signed)
Anesthesia Evaluation  Patient identified by MRN, date of birth, ID band Patient awake    Reviewed: Allergy & Precautions, NPO status , Patient's Chart, lab work & pertinent test results, reviewed documented beta blocker date and time   History of Anesthesia Complications Negative for: history of anesthetic complications  Airway Mallampati: II   Neck ROM: Full    Dental  (+) Poor Dentition   Pulmonary neg COPD, neg PE   breath sounds clear to auscultation       Cardiovascular hypertension, (-) angina +CHF  (-) Past MI, (-) Cardiac Stents and (-) CABG (-) pacemaker(-) Cardiac Defibrillator  Rhythm:Regular Rate:Normal  Autoimmune vasculitis   Neuro/Psych neg Headaches, Seizures -, Well Controlled,  CVA, No Residual Symptoms    GI/Hepatic ,neg GERD  ,,(+) neg Cirrhosis        Endo/Other    Renal/GU CRFRenal disease     Musculoskeletal  (+) Arthritis ,    Abdominal   Peds  Hematology  (+) Blood dyscrasia, anemia   Anesthesia Other Findings   Reproductive/Obstetrics                              Anesthesia Physical Anesthesia Plan  ASA: 3  Anesthesia Plan: General   Post-op Pain Management:    Induction: Intravenous  PONV Risk Score and Plan: 2 and Ondansetron and Dexamethasone  Airway Management Planned: LMA  Additional Equipment:   Intra-op Plan:   Post-operative Plan:   Informed Consent: I have reviewed the patients History and Physical, chart, labs and discussed the procedure including the risks, benefits and alternatives for the proposed anesthesia with the patient or authorized representative who has indicated his/her understanding and acceptance.     Dental advisory given  Plan Discussed with: CRNA  Anesthesia Plan Comments:          Anesthesia Quick Evaluation

## 2023-05-09 NOTE — Progress Notes (Signed)
HD#5 SUBJECTIVE:  Patient Summary: [name] is a 62 year old male with a past medical history of microscopic polyangiitis, stage IV CKD, and multiple CVAs who presented to [facility] ED on 12/21 with a L leg injury and was admitted to the Internal Medicine Teaching Service for lower left leg hematoma.   Overnight Events:  No overnight events reported.   Interim History:  Patient feels well this morning, no acute complaints. Visited by Dr. Christell Constant earlier, told he could have breakfast then resume NPO at 9 AM for his surgery with orthopedics this evening.   OBJECTIVE:  Vital Signs: Vitals:   05/08/23 0700 05/08/23 1615 05/08/23 1957 05/09/23 0811  BP: (!) 174/80 (!) 148/76 (!) 155/75 (!) 153/79  Pulse: 64 70 64 71  Resp: 18 18 20 20   Temp: 98.1 F (36.7 C) 98.5 F (36.9 C) 97.9 F (36.6 C) 97.8 F (36.6 C)  TempSrc: Oral Oral Oral Oral  SpO2: 99% 99% 100% 98%  Weight:      Height:          Latest Ref Rng & Units 05/09/2023    4:31 AM 05/08/2023    4:27 AM 05/07/2023    5:40 AM  CBC  WBC 4.0 - 10.5 K/uL 5.0  5.3  5.3   Hemoglobin 13.0 - 17.0 g/dL 8.9  9.1  9.1   Hematocrit 39.0 - 52.0 % 25.8  26.5  26.2   Platelets 150 - 400 K/uL 121  135  127        Latest Ref Rng & Units 05/09/2023    4:31 AM 05/08/2023    4:27 AM 05/07/2023    5:40 AM  BMP  Glucose 70 - 99 mg/dL 161  096  045   BUN 8 - 23 mg/dL 86  85  90   Creatinine 0.61 - 1.24 mg/dL 4.09  8.11  9.14   Sodium 135 - 145 mmol/L 142  143  143   Potassium 3.5 - 5.1 mmol/L 3.4  3.6  3.4   Chloride 98 - 111 mmol/L 108  108  109   CO2 22 - 32 mmol/L 23  23  23    Calcium 8.9 - 10.3 mg/dL 8.7  8.8  8.9     Supplemental O2: Room Air SpO2: 98 % O2 Flow Rate (L/min): 0 L/min  Filed Weights   05/04/23 0324 05/04/23 1631  Weight: 72.5 kg 68.7 kg    Intake/Output Summary (Last 24 hours) at 05/09/2023 0843 Last data filed at 05/09/2023 0816 Gross per 24 hour  Intake 1250 ml  Output 675 ml  Net 575 ml   Net  IO Since Admission: -1,675 mL [05/09/23 0843]  Physical Exam: Physical Exam Constitutional:      General: He is not in acute distress.    Appearance: Normal appearance. He is not ill-appearing.  Pulmonary:     Effort: Pulmonary effort is normal.  Musculoskeletal:     Comments: Left lower extremity still wrapped in dressing with drain and wound vac in place. Moving all limbs spontaneously.   Neurological:     General: No focal deficit present.     Mental Status: He is alert.  Psychiatric:        Mood and Affect: Mood normal.        Behavior: Behavior normal.    Patient Lines/Drains/Airways Status     Active Line/Drains/Airways     Name Placement date Placement time Site Days   Peripheral IV 05/04/23 20 G Left Antecubital  05/04/23  0440  Antecubital  1   Wound / Incision (Open or Dehisced) 09/17/22 Puncture Lumbar Lateral;Right renal biopsy 09/17/22  0850  Lumbar  230            ASSESSMENT/PLAN:  Assessment: Principal Problem:   Traumatic hematoma of left lower leg Active Problems:   Seizures (HCC)   Vasculitis (HCC)   Hypertension   Thrombocytopenia (HCC)   CKD (chronic kidney disease) stage 4, GFR 15-29 ml/min (HCC)   Hematoma  Jeremy Grant is a 62 yo with pertinent PMH of microscopic polyangiitis, stage IV CKD, and multiple CVAs who presented with L leg injury and was admitted for traumatic hematoma of the left lower leg.    Plan: Traumatic hematoma of left lower leg Patient continues to do well s/p debridement on 12/22. Denies pain or new acute complaints. Per medication history has not requested PRN pain medications since 12/23. Will resume NPO status at 9 AM this morning for repeat debridement this evening with Dr. Christell Constant.  - Follow up ortho recommendations for wound care, antibiotics, and outpatient follow-up s/p repeat debridement today 12/26 - Give diet s/p debridement - Pain management: continue oxycodone 5 mg po every 4h as needed, Dilaudid 0.5 mg IV  every 4h as needed for breakthrough - Bowel regimen: Miralax daily  - IV Cefazolin 1g BID - Daily labs - Continue tizanidine 4 mg q8H as needed for muscle spasms  Microscopic Polyangiitis CKD stage 4 History of microscopic polyangiitis requiring hemodialysis in the past, not currently on HD. Creatinine function seems to be progressively worsening over the last year. Creatinine today 3.95 (4.76 on admission). Continue to encourage oral hydration, home medications, and monitoring with daily labs.  - Continue home prednisone 5 mg daily and azathioprine 50 mg daily  HTN Chronic. Mostly normotensive on admission, with BP more persistently elevated to 140s-150s systolic and 70s diastolic today. On home amlodipine, coreg, and doxazosin.  - Continue amlodipine 10 mg daily and Coreg 12.5 mg BID  - Continue doxazosin 4 mg BID, increase to home dose 8 mg BID as indicated   History of stroke Residual weakness of left lower extremity from stroke in 2020. Holding home aspirin 81 mg in the setting of easy bleeding, hematoma, and surgical intervention. Continue home atorvastatin 80 mg.   HLD Continued home atorvastatin 80 mg daily on admission.   Best Practice: Diet: NPO until procedure, then Regular  IVF: oral rehydration VTE: SCDs  Code: Full AB: None Family Contact: Jeremy Grant (wife) [phone] DISPO: Anticipated discharge pending surgical debridement   Signature: Jeremy Sonn Colbert Coyer, MD  Internal Medicine Resident, PGY-1 [facility]  8:43 AM, 05/09/2023   Please contact the on call pager after 5 pm and on weekends at [phone].

## 2023-05-09 NOTE — Care Management Important Message (Signed)
Important Message  Patient Details  Name: Jeremy Grant MRN: [id number] Date of Birth: [birth year]   Important Message Given:  Yes - Medicare IM     Dorena Bodo 05/09/2023, 3:19 PM

## 2023-05-09 NOTE — Discharge Instructions (Addendum)
Orthopedic Surgery Discharge Instructions  Patient name: Jeremy Grant Injury: left leg hematoma Procedure Performed: left leg hematoma debridement Date of Surgery: 05/05/2023, 05/09/2023 Surgeon: Willia Craze, MD  Activity: You are allowed to put as much weight on your leg as you would like. You can walk as much as you would like.  Incision Care: Your incision site has a wound vac over it. This wound vac is drawing in blood flow to promote healing. The wound vac is rechargable. You should keep this charged so it does not run out of battery. The wound vac should remain on at all times. Do not take the sticky brown wrap around your leg because it is helping hold the vac in place. You should leave the vac on and dry at all times until you are seen in the office. If there are issues with the vac or it starts beeping, please call the office for further instructions.   Driving: You should not drive until cleared to do so in the office.    Diet: You are safe to resume your regular diet after surgery.   Reasons to Call the Office After Surgery: You should feel free to call the office with any concerns or questions you have in the post-operative period, but you should definitely notify the office if you develop: -shortness of breath, chest pain, or trouble breathing -excessive bleeding, drainage, redness, or swelling around the surgical site -fevers, chills, or pain that is getting worse with each passing day -persistent nausea or vomiting -new weakness in the left leg, new or worsening numbness or tingling in the left leg -other concerns about your surgery  Follow Up Appointments: You should call the office after you are discharge to get an appointment with Dr. Lajoyce Corners. He wants to see you about 1 week from surgery (~05/16/2023). The office phone number and location are listed below.   Office Information:  -Aldean Baker, MD -Phone number: [phone] -Address: [address]        [place], [place] [address]    Patient instructions You were seen for a left leg injury resulting in a hematoma. You were treated with surgical and medical management, including two debridements with Dr. Lajoyce Corners (12/22) and Dr. Rod Mae (12/26).  You were discharged with the following two new medications:  - Hydromorphone/Dilaudid: 1 mg to be taken every 6 hours as needed for moderate to severe pain (7-10 out of 10). Short course of 3 days provided.  - Miralax: medications like hydromorphone can lead to constipation, and taking Miralax can help you to have regular bowel movements.  Please follow directions provided by your pharmacy. If you have any issues with these medications please discuss with your primary care provider.   You will need to follow up outpatient with orthopedic surgery in 1 week (around 05/15/2022). You were provided with Dr. Audrie Lia office numbers and wound care instructions.  [facility] [address] [place], [place] [address] [phone]  We resumed your home medications at discharge. We did not make any changes to frequency or dosing.   You will need to follow up with your primary care provider, Dr. Hal Hope, in the next 7-10 days. Please call their office and make a hospital follow-up appointment so that they may review any medication changes and obtain necessary labs.   Please call 911 or go to the nearest emergency department if you have any new bleeding from the surgical site or you have new fever and pain at the surgical site  as you may be having a medical emergency.   We are so glad you are feeling better. It was a pleasure serving you during your stay. -Dr. Justin Mend and the Internal Medicine Teaching Service team.

## 2023-05-10 ENCOUNTER — Encounter (HOSPITAL_COMMUNITY): Payer: Self-pay | Admitting: Orthopedic Surgery

## 2023-05-10 ENCOUNTER — Other Ambulatory Visit (HOSPITAL_COMMUNITY): Payer: Self-pay

## 2023-05-10 DIAGNOSIS — I776 Arteritis, unspecified: Secondary | ICD-10-CM | POA: Diagnosis not present

## 2023-05-10 DIAGNOSIS — I129 Hypertensive chronic kidney disease with stage 1 through stage 4 chronic kidney disease, or unspecified chronic kidney disease: Secondary | ICD-10-CM | POA: Diagnosis not present

## 2023-05-10 DIAGNOSIS — T148XXA Other injury of unspecified body region, initial encounter: Secondary | ICD-10-CM | POA: Diagnosis not present

## 2023-05-10 LAB — CBC
HCT: 24.4 % — ABNORMAL LOW (ref 39.0–52.0)
Hemoglobin: 8.3 g/dL — ABNORMAL LOW (ref 13.0–17.0)
MCH: 30.9 pg (ref 26.0–34.0)
MCHC: 34 g/dL (ref 30.0–36.0)
MCV: 90.7 fL (ref 80.0–100.0)
Platelets: 137 10*3/uL — ABNORMAL LOW (ref 150–400)
RBC: 2.69 MIL/uL — ABNORMAL LOW (ref 4.22–5.81)
RDW: 12.4 % (ref 11.5–15.5)
WBC: 5.8 10*3/uL (ref 4.0–10.5)
nRBC: 0 % (ref 0.0–0.2)

## 2023-05-10 LAB — BASIC METABOLIC PANEL
Anion gap: 12 (ref 5–15)
BUN: 79 mg/dL — ABNORMAL HIGH (ref 8–23)
CO2: 21 mmol/L — ABNORMAL LOW (ref 22–32)
Calcium: 8.1 mg/dL — ABNORMAL LOW (ref 8.9–10.3)
Chloride: 108 mmol/L (ref 98–111)
Creatinine, Ser: 3.91 mg/dL — ABNORMAL HIGH (ref 0.61–1.24)
GFR, Estimated: 16 mL/min — ABNORMAL LOW (ref >60)
Glucose, Bld: 101 mg/dL — ABNORMAL HIGH (ref 70–99)
Potassium: 3.8 mmol/L (ref 3.5–5.1)
Sodium: 141 mmol/L (ref 135–145)

## 2023-05-10 MED ORDER — ASPIRIN 81 MG PO TBEC
81.0000 mg | DELAYED_RELEASE_TABLET | Freq: Every day | ORAL | Status: DC
  Administered 2023-05-10: 81 mg via ORAL
  Filled 2023-05-10: qty 1

## 2023-05-10 MED ORDER — DOXAZOSIN MESYLATE 8 MG PO TABS
8.0000 mg | ORAL_TABLET | Freq: Two times a day (BID) | ORAL | Status: DC
  Administered 2023-05-10: 8 mg via ORAL
  Filled 2023-05-10 (×2): qty 1

## 2023-05-10 MED ORDER — HYDROMORPHONE HCL 2 MG PO TABS
1.0000 mg | ORAL_TABLET | Freq: Four times a day (QID) | ORAL | Status: DC | PRN
  Administered 2023-05-10: 1 mg via ORAL
  Filled 2023-05-10: qty 1

## 2023-05-10 MED ORDER — POLYETHYLENE GLYCOL 3350 17 GM/SCOOP PO POWD
17.0000 g | Freq: Every day | ORAL | 0 refills | Status: AC
  Filled 2023-05-10: qty 238, 14d supply, fill #0

## 2023-05-10 MED ORDER — HYDROMORPHONE HCL 2 MG PO TABS
1.0000 mg | ORAL_TABLET | Freq: Four times a day (QID) | ORAL | 0 refills | Status: DC | PRN
  Filled 2023-05-10: qty 6, 3d supply, fill #0

## 2023-05-10 NOTE — Plan of Care (Signed)
Patient alert/oriented X4. Patient compliant with medication administration and switched over/educated on home wound vac usage. Patient VSS, personal belongings packed up at bedside. Dilaudid PO administered as needed for LLE pain. Medications delivered to patient from [facility] pharmacy.   Problem: Education: Goal: Knowledge of General Education information will improve Description: Including pain rating scale, medication(s)/side effects and non-pharmacologic comfort measures Outcome: Adequate for Discharge   Problem: Health Behavior/Discharge Planning: Goal: Ability to manage health-related needs will improve Outcome: Adequate for Discharge   Problem: Clinical Measurements: Goal: Ability to maintain clinical measurements within normal limits will improve Outcome: Adequate for Discharge   Problem: Clinical Measurements: Goal: Will remain free from infection Outcome: Adequate for Discharge   Problem: Clinical Measurements: Goal: Diagnostic test results will improve Outcome: Adequate for Discharge   Problem: Clinical Measurements: Goal: Respiratory complications will improve Outcome: Adequate for Discharge   Problem: Clinical Measurements: Goal: Cardiovascular complication will be avoided Outcome: Adequate for Discharge   Problem: Activity: Goal: Risk for activity intolerance will decrease Outcome: Adequate for Discharge   Problem: Nutrition: Goal: Adequate nutrition will be maintained Outcome: Adequate for Discharge   Problem: Coping: Goal: Level of anxiety will decrease Outcome: Adequate for Discharge   Problem: Elimination: Goal: Will not experience complications related to bowel motility Outcome: Adequate for Discharge   Problem: Elimination: Goal: Will not experience complications related to urinary retention Outcome: Adequate for Discharge   Problem: Pain Management: Goal: General experience of comfort will improve Outcome: Adequate for Discharge   Problem:  Safety: Goal: Ability to remain free from injury will improve Outcome: Adequate for Discharge   Problem: Skin Integrity: Goal: Risk for impaired skin integrity will decrease Outcome: Adequate for Discharge

## 2023-05-10 NOTE — Discharge Summary (Signed)
Name: Jeremy Grant MRN: [id number] DOB: 12-11-1960 62 y.o. PCP: Dois Davenport, MD  Date of Admission: 05/04/2023  3:19 AM Date of Discharge: 05/10/2023 11:08 AM Attending Physician: Dr. Ninetta Lights  Discharge Diagnosis: Principal Problem:   Traumatic hematoma of left lower leg Active Problems:   Seizures (HCC)   Vasculitis (HCC)   Hypertension   Thrombocytopenia (HCC)   CKD (chronic kidney disease) stage 4, GFR 15-29 ml/min (HCC)   Hematoma    Discharge Medications: Allergies as of 05/10/2023       Reactions   Hydralazine Hcl    "Almost killed him" per wife        Medication List     TAKE these medications    acetaminophen 325 MG tablet Commonly known as: TYLENOL Take 2 tablets (650 mg total) by mouth every 6 (six) hours as needed for moderate pain.   amLODipine 10 MG tablet Commonly known as: NORVASC Take 1 tablet (10 mg total) by mouth daily.   aspirin EC 81 MG tablet Take 1 tablet (81 mg total) by mouth daily.   atorvastatin 80 MG tablet Commonly known as: LIPITOR Take 1 tablet (80 mg total) by mouth daily at 6 PM.   azaTHIOprine 50 MG tablet Commonly known as: IMURAN Take 1 tablet (50 mg total) by mouth daily.   carvedilol 12.5 MG tablet Commonly known as: COREG Take 12.5 mg by mouth 2 (two) times daily with a meal.   doxazosin 8 MG tablet Commonly known as: CARDURA 8 mg 2 (two) times daily. Increased to 4 mg 2x/day per pt conversation with nephrologist   HYDROmorphone 2 MG tablet Commonly known as: DILAUDID Take 0.5 tablets (1 mg total) by mouth every 6 (six) hours as needed for up to 12 doses for severe pain (pain score 7-10).   polyethylene glycol 17 g packet Commonly known as: MIRALAX / GLYCOLAX Take 17 g by mouth daily.   predniSONE 5 MG tablet Commonly known as: DELTASONE Take 5 mg by mouth daily with breakfast.   tiZANidine 4 MG tablet Commonly known as: ZANAFLEX Take 1 tablet (4 mg total) by mouth every 8 (eight) hours as  needed for muscle spasms.               Discharge Care Instructions  (From admission, onward)           Start     Ordered   05/10/23 0000  Discharge wound care:       Comments: Please keep dressing dry and clean. Home wound vac is to remain connected. Please follow instructions provided by your surgeons, Dr. Moore/Dr. Lajoyce Corners:  Your incision site has a wound vac over it. This wound vac is drawing in blood flow to promote healing. The wound vac is rechargable. You should keep this charged so it does not run out of battery. The wound vac should remain on at all times. Do not take the sticky brown wrap around your leg because it is helping hold the vac in place. You should leave the vac on and dry at all times until you are seen in the office. If there are issues with the vac or it starts beeping, please call the office for further instructions.   05/10/23 1105            Disposition and follow-up:   Jeremy Grant was discharged from [facility] in Stable condition.  At the hospital follow up visit please address:  1.  Follow-up:  Traumatic hematoma of left lower leg - Please ensure patient has followed up with Dr. Azzie Almas surgery, injury site is healing, patient is without signs of fever/infection, and pain is well managed. Provided patient with short course of hydromorphone/Dilaudid and miralax.   Microscopic Polyangiitis - Serum creatinine 3.91 on day of discharge from 4.76 on day of admission. Please ensure patient continues to void well and is asymptomatic. Recommend BMP outpatient. Ensure patient is still taking prednisone 5 mg daily and azathioprine 50 mg daily consistently.   HTN - Patient's blood pressure intermittently elevated, up to 150s-160s systolic. Resumed his home antihypertensives throughout admission. Discharged on amlodipine 10 mg daily, Coreg 12.5 mg BID, and home dose doxazosin 8 mg BID. Please check BP outpatient and adjust  antihypertensive regimen as needed. Patient can also discuss with nephrologist.   History of stroke - Patient was taking aspirin 81 mg daily for history of stroke prior to current hospitalization. It was held in the setting of bleeding/hematoma. It was resumed on day of discharge, which will also help with DVT prophylaxis per Dr. Moore/orthopedic surgery. Please assess whether patient is taking consistently and/or if he is experiencing any other easy bleeding/hematomas.    2.  Labs / imaging needed at time of follow-up: BMP, CBC  3.  Pending labs/ test needing follow-up: N/A  4.  Medication Changes  STOPPED  - N/A   ADDED  - PO Hydromorphone/Dilaudid 1 mg q6H   - Miralax PRN    MODIFIED  - N/A  Follow-up Appointments:  Follow-up Information     Nadara Mustard, MD Follow up in 2 week(s).   Specialty: Orthopedic Surgery Contact information: [address] [place] [address] [phone]                 Hospital Course by problem list: Patient Summary:  Jeremy Grant is a 62 year old male with a past medical history of microscopic polyangiitis, stage IV CKD, and multiple CVAs who presented to [facility] ED on 12/21 with a left leg injury and was admitted to the Internal Medicine Teaching Service for lower left leg hematoma.   Traumatic hematoma of left lower leg Patient's injury to left leg occurred from colliding with the door to his Jeep. History of polyangiitis and chronic thrombocytopenia makes him vulnerable to bleeding. Orthopedic surgery was consulted and performed an excisional debridement on 12/22. Patient reported 6.5 out of 10 pain right after the procedure, with improvement in pain on PO oxycodone 5 mg every 4h as needed and IV Dilaudid 0.5 mg every 4h as needed for breakthrough pain. He was also resumed on home tizanidine 4 mg q8H PRN for leg spasms and on IV cefazolin, renally dosed, for prophylaxis. His leg was wrapped in a clean dressing with a surgical  drain and wound vac in place. Patient continued to do well with pain regimen, and was able to bear weight as tolerated to mobilize with PT. He was continued on the IV Cefazolin until repeat surgical debridement 12/26 with Dr. Christell Constant. Patient tolerated procedure well. Patient received an additional dose of IV Cefazolin after the procedure. No outpatient antibiotics were recommended. Left lower extremity was re-wrapped in a clean dressing and a home wound vac was provided. Daily aspirin 81 mg daily was resumed, per orthopedic surgery's recommendation. Patient was assessed by PT, able to bear weight, no outpatient follow up recommended. Patient is to see Dr. Lajoyce Corners in his office in 1 week (around 05/15/2022).    Microscopic Polyangiitis CKD stage  4 History of microscopic polyangiitis requiring hemodialysis in the past, not currently on HD. Creatinine function seems to be progressively worsening over the last year. Encouraged oral hydration and resumed home prednisone 5 mg daily and azathioprine 50 mg daily on admission. Creatinine of 4.76 on admission and 3.91 on day of discharge. Recommend outpatient BMP and regular follow up.    HTN Chronic. Mostly normotensive on admission, with BP more persistently elevated to 140s-150s systolic and 70s diastolic throughout admission. Resumed home amlodipine, coreg, and doxazosin as indicated. Discharged on amlodipine 10 mg daily, Coreg 12.5 mg BID, and home dose doxazosin 8 mg BID. Recommend outpatient BP check and follow up.    History of stroke Residual weakness of left lower extremity from stroke in 2020. Held home aspirin 81 mg in the setting of easy bleeding, hematoma, and surgical intervention. Continued home atorvastatin 80 mg on admission. Resumed aspirin 81 mg daily on day of discharge.    HLD Continued home atorvastatin 80 mg daily on admission.    Discharge Subjective: Feeling some soreness/pain this morning following surgical debridement yesterday evening.  Requested some pain medication earlier, endorses improvement in pain. Denies nausea, vomiting, or loose stools. Discussed plan regarding pain management, PT prior to discharge, follow-up with ortho outpatient, and need for hospital follow-up visit with PCP. Patient endorsed understanding. Patient also ok with resuming aspirin (home dose aspirin 81 mg daily).   Discharge Exam:   Blood pressure (!) 151/71, pulse 64, temperature 98.4 F (36.9 C), temperature source Oral, resp. rate 18, height 6\' 1"  (1.854 m), weight 68.7 kg, SpO2 99%.  Constitutional: Well-appearing, resting comfortably in bed in no acute distress. HENT: normocephalic atraumatic, mucous membranes moist Pulmonary/Chest: normal work of breathing on room air. Abdominal: soft, non-tender, non-distended. Neurological: alert & oriented, no focal deficits noted.  MSK: lower left extremity wrapped in clean dressing, wound vac in place. No overt bleeding. Patient able to wiggle toes and lower extremities bilaterally spontaneously and on command.  Skin: warm and dry. Psych: normal mood and affect.  Pertinent Labs, Studies, and Procedures:     Latest Ref Rng & Units 05/10/2023    5:34 AM 05/09/2023    4:31 AM 05/08/2023    4:27 AM  CBC  WBC 4.0 - 10.5 K/uL 5.8  5.0  5.3   Hemoglobin 13.0 - 17.0 g/dL 8.3  8.9  9.1   Hematocrit 39.0 - 52.0 % 24.4  25.8  26.5   Platelets 150 - 400 K/uL 137  121  135        Latest Ref Rng & Units 05/10/2023    5:34 AM 05/09/2023    4:31 AM 05/08/2023    4:27 AM  CMP  Glucose 70 - 99 mg/dL 161  096  045   BUN 8 - 23 mg/dL 79  86  85   Creatinine 0.61 - 1.24 mg/dL 4.09  8.11  9.14   Sodium 135 - 145 mmol/L 141  142  143   Potassium 3.5 - 5.1 mmol/L 3.8  3.4  3.6   Chloride 98 - 111 mmol/L 108  108  108   CO2 22 - 32 mmol/L 21  23  23    Calcium 8.9 - 10.3 mg/dL 8.1  8.7  8.8     CT EXTREMITY LOWER LEFT WO CONTRAST Result Date: 05/04/2023 CLINICAL DATA:  Lower leg trauma. Patient was working  on car when he hit his left leg resulting in hematoma. EXAM: CT OF THE LOWER LEFT EXTREMITY WITHOUT  CONTRAST TECHNIQUE: Multidetector CT imaging of the lower left extremity was performed according to the standard protocol. RADIATION DOSE REDUCTION: This exam was performed according to the departmental dose-optimization program which includes automated exposure control, adjustment of the mA and/or kV according to patient size and/or use of iterative reconstruction technique. COMPARISON:  None Available. FINDINGS: Bones/Joint/Cartilage No acute fracture or dislocation. Ligaments Suboptimally assessed by CT. Muscles and Tendons Superficial hematoma scratch set superficial mass extending along the lateral aspect of the left lower leg has mass effect upon the anterolateral muscle compartment. No signs of intramuscular fluid collection or mass. Soft tissues Soft tissue edema within the subcutaneous fat extends the length of the lateral soft tissues of the left lower extremity. Mild anteromedial subcutaneous edema/soft tissue swelling within the proximal left lower leg is also identified. Overlying the mid shaft of the fibula there is a large, heterogeneous hyperdense mass extending along the subcutaneous soft tissues of the lateral aspect of the left lower leg. This measures 7.6 x 3.7 by 14.4 cm, image 38/7. IMPRESSION: 1. No evidence for acute fracture or dislocation. 2. Large, heterogeneous hyperdense mass extending along the subcutaneous soft tissues of the lateral aspect of the left lower leg. This measures 7.6 x 3.7 x 14.4 cm. Imaging features are compatible with a large superficial hematoma. 3. Soft tissue edema within the subcutaneous fat extends along the lateral soft tissues of the left lower extremity. Mild anteromedial subcutaneous edema/soft tissue swelling is also identified. Electronically Signed   By: Signa Kell M.D.   On: 05/04/2023 06:52    Discharge Instructions: Discharge Instructions     Call  MD for:   Complete by: As directed    Call MD for:  difficulty breathing, headache or visual disturbances   Complete by: As directed    Call MD for:  extreme fatigue   Complete by: As directed    Call MD for:  hives   Complete by: As directed    Call MD for:  persistant dizziness or light-headedness   Complete by: As directed    Call MD for:  persistant nausea and vomiting   Complete by: As directed    Call MD for:  redness, tenderness, or signs of infection (pain, swelling, redness, odor or green/yellow discharge around incision site)   Complete by: As directed    Call MD for:  severe uncontrolled pain   Complete by: As directed    Call MD for:  temperature >100.4   Complete by: As directed    Diet - low sodium heart healthy   Complete by: As directed    Discharge instructions   Complete by: As directed    Patient instructions - You were seen for a left leg injury resulting in a hematoma. You were treated with surgical and medical management, including two debridements with Dr. Lajoyce Corners (12/22) and Dr. Christell Constant (12/26). - You were discharged with the following two new medications:  - Hydromorphone/Dilaudid 1 mg to be taken every 6 hours as needed for moderate to severe pain (7-10 out of 10). Short course of 3 days provided.  - Miralax: medications like hydromorphone can lead to constipation, and taking Miralax can help you to have regular bowel movements.  Please follow directions provided by pharmacy. If you have any issues with these medications please discuss with your primary care provider.  - You will need to follow up outpatient with orthopedic surgery in 1 week. You were provided with Dr. Audrie Lia office numbers and wound care instructions.  [facility] [address] [place], [place] [address] [phone] - We resumed your home medications at discharge. We did not make any changes to frequency or dosing.  - You will need to follow up with your primary care  provider, Dr. Hal Hope, in the next 7-10 days. Please call their office and make a hospital follow-up appointment so that they may review any medication changes and obtain necessary labs.  - Please call 911 or go to the nearest emergency department if you have any new bleeding from the surgical site or you have new fever and pain at the surgical site as you may be having a medical emergency.  - We are so glad you are feeling better. It was a pleasure serving you during your stay. -Dr. Justin Mend and the Internal Medicine Teaching Service team.   Discharge wound care:   Complete by: As directed    Please keep dressing dry and clean. Home wound vac is to remain connected. Please follow instructions provided by your surgeons, Dr. Moore/Dr. Lajoyce Corners:  Your incision site has a wound vac over it. This wound vac is drawing in blood flow to promote healing. The wound vac is rechargable. You should keep this charged so it does not run out of battery. The wound vac should remain on at all times. Do not take the sticky brown wrap around your leg because it is helping hold the vac in place. You should leave the vac on and dry at all times until you are seen in the office. If there are issues with the vac or it starts beeping, please call the office for further instructions.   Increase activity slowly   Complete by: As directed       Signed: Gavan Nordby Colbert Coyer, MD [facility] - PGY1 Pager: [phone] 05/10/2023, 11:08 AM    Please contact the on call pager after 5 pm and on weekends at [phone].

## 2023-05-10 NOTE — Anesthesia Postprocedure Evaluation (Signed)
Anesthesia Post Note  Patient: Jeremy Grant  Procedure(s) Performed: IRRIGATION AND DEBRIDEMENT OF LEG (Left: Leg Lower)     Patient location during evaluation: PACU Anesthesia Type: General Level of consciousness: awake and alert Pain management: pain level controlled Vital Signs Assessment: post-procedure vital signs reviewed and stable Respiratory status: spontaneous breathing, nonlabored ventilation, respiratory function stable and patient connected to nasal cannula oxygen Cardiovascular status: blood pressure returned to baseline and stable Postop Assessment: no apparent nausea or vomiting Anesthetic complications: no   No notable events documented.  Last Vitals:  Vitals:   05/10/23 0358 05/10/23 0808  BP: 137/71 (!) 151/71  Pulse: 67 64  Resp: 18 18  Temp:  36.9 C  SpO2: 98% 99%    Last Pain:  Vitals:   05/10/23 1003  TempSrc:   PainSc: 0-No pain                 [place]

## 2023-05-10 NOTE — Progress Notes (Signed)
Discharge instructions (including medications) discussed with and copy provided to patient/caregiver Patient wound vac was switched to home wound vac.

## 2023-05-10 NOTE — Evaluation (Signed)
Physical Therapy Brief Evaluation and Discharge Note Patient Details Name: Jeremy Grant MRN: [id number] DOB: 1960-12-24 Today's Date: 05/10/2023   History of Present Illness  The pt is a 62 yo male presenting 12/21 with LLE wound, now s/p I&D of L calf on 12/22 and 12/26. PMH includes: CVA with L sided weakness, CHF, HTN, HLD, glomerulonephritis, pancytopenia/chronic anemia, seizure left-sided post stroke.   Clinical Impression  Pt in bed upon arrival of PT, agreeable to evaluation at this time. Prior to admission the pt was independent without use of DME, no falls, and living with his wife in single story home with a ramped entrance. The pt was able to complete bed mobility without assistance as well as hallway ambulation of 250 ft with use of RW. He needed slight assistance to rise from EOB, but demos improved performance at end of session after his left leg had "loosened up." The pt and his wife were educated on progressive mobility recommendations and to seek outpatient PT referral if pt's gait does not return to full normal (no use of DME), or if there is any ROM restriction as his pain subsides. They expressed understanding and are ready to return home.    PT Assessment Patient does not need any further PT services  Assistance Needed at Discharge  PRN    Equipment Recommendations None recommended by PT  Recommendations for Other Services       Precautions/Restrictions Precautions Precautions: Fall Precaution Comments: wound vacl L calf Restrictions Weight Bearing Restrictions Per Provider Order: Yes LLE Weight Bearing Per Provider Order: Weight bearing as tolerated        Mobility  Bed Mobility Rolling: Independent        Transfers Overall transfer level: Needs assistance Equipment used: Rolling walker (2 wheels) Transfers: Sit to/from Stand Sit to Stand: Min assist, From elevated surface           General transfer comment: minA to power up, not resting LLE  on ground during transfers    Ambulation/Gait Ambulation/Gait assistance: Supervision Gait Distance (Feet): 250 Feet Assistive device: Rolling walker (2 wheels) Gait Pattern/deviations: Step-to pattern, Step-through pattern, Decreased stance time - left, Decreased dorsiflexion - left, Decreased weight shift to left Gait Speed: Pace WFL General Gait Details: progressed from TWDB LLE to WBAT LLE with continued walking. no overt buckling or toe drag. progressed from step-to to step-thorugh     Balance Overall balance assessment: Mild deficits observed, not formally tested Sitting-balance support: Feet supported, No upper extremity supported Sitting balance-Leahy Scale: Good     Standing balance support: Bilateral upper extremity supported, No upper extremity supported Standing balance-Leahy Scale: Fair            Pertinent Vitals/Pain PT - Brief Vital Signs All Vital Signs Stable: Yes Pain Assessment Pain Assessment: 0-10 Pain Score: 1  Pain Location: L calf Pain Descriptors / Indicators: Grimacing, Sore Pain Intervention(s): Limited activity within patient's tolerance, Monitored during session, Premedicated before session, Repositioned     Home Living Family/patient expects to be discharged to:: Private residence Living Arrangements: Spouse/significant other Available Help at Discharge: Family;Available 24 hours/day Home Environment: Ramped entrance;Rail - right;Rail - left   Home Equipment: Rolling Walker (2 wheels);Rollator (4 wheels);Cane - single point;BSC/3in1;Shower seat;Tub bench;Wheelchair - manual        Prior Function Level of Independence: Independent Comments: uses cane in community, no DME in home, no falls    UE/LE Assessment   UE ROM/Strength/Tone/Coordination: WFL    LE ROM/Strength/Tone/Coordination: Impaired LE  ROM/Strength/Tone/Coordination Deficits: LLE limited by prior stroke and pain in L calf. limited L ankle ROM and DF    Communication    Communication Communication: No apparent difficulties Cueing Techniques: Verbal cues     Cognition Overall Cognitive Status: Appears within functional limits for tasks assessed/performed       General Comments General comments (skin integrity, edema, etc.): VSS on RA, dressing intact     PT Visit Diagnosis Unsteadiness on feet (R26.81);Pain    No Skilled PT All education completed;Patient will have necessary level of assist by caregiver at discharge;Patient is supervision for all activity/mobility    AMPAC 6 Clicks Help needed turning from your back to your side while in a flat bed without using bedrails?: None Help needed moving from lying on your back to sitting on the side of a flat bed without using bedrails?: None Help needed moving to and from a bed to a chair (including a wheelchair)?: None Help needed standing up from a chair using your arms (e.g., wheelchair or bedside chair)?: A Little Help needed to walk in hospital room?: A Little Help needed climbing 3-5 steps with a railing? : A Little 6 Click Score: 21      End of Session Equipment Utilized During Treatment: Gait belt Activity Tolerance: Patient tolerated treatment well Patient left: in bed;with call bell/phone within reach;with family/visitor present Nurse Communication: Mobility status (safe for dc/) PT Visit Diagnosis: Unsteadiness on feet (R26.81);Pain Pain - Right/Left: Left Pain - part of body: Leg     Time: 1022-1050 PT Time Calculation (min) (ACUTE ONLY): 28 min  Charges:   PT Evaluation $PT Eval Moderate Complexity: 1 Mod      Vickki Muff, PT, DPT   Acute Rehabilitation Department Office [phone] Secure Chat Communication Preferred  Ronnie Derby  05/10/2023, 11:07 AM

## 2023-05-10 NOTE — Progress Notes (Signed)
Wound vac machine replaced by MD. No leakage present in seal and getting sufficient suction. Patient has all personal belongings and is being wheeled out to spouses car.

## 2023-05-10 NOTE — Progress Notes (Signed)
   05/10/23 1133  TOC Brief Assessment  Insurance and Status Reviewed  Patient has primary care physician Yes  Home environment has been reviewed home w/ spouse  Prior level of function: self  Prior/Current Home Services No current home services  Social Drivers of Health Review SDOH reviewed no interventions necessary  Readmission risk has been reviewed Yes  Transition of care needs no transition of care needs at this time    Pt stable for transition home today, No HH or DME needs noted. Pt to d/c home with prevena VAC and follow up as per AVS instructions.

## 2023-05-10 NOTE — Progress Notes (Signed)
Orthopedic Surgery Progress Note   Assessment: Patient is a 62 y.o. male with left calf hematoma s/p I&D (x2)   Plan: -Operative plans complete -Regular diet -Weight bearing status: as tolerated  -Keep vac on at all times. Can transition him to the home vac unit prior to discharge -PT evaluate and treat -Pain control -Normally, would use dvt ppx but per patient and his wife, he cannot have any blood thinners. Encouraged regular ambulation -Dispo: per primary  ___________________________________________________________________________  Subjective: No acute events since surgery. Pain adequately controlled. No complaints this morning.    Physical Exam:  General: no acute distress, appears stated age Neurologic: alert, answering questions appropriately, following commands Respiratory: unlabored breathing on room air, symmetric chest rise  MSK:   -Left lower extremity  Wound vac in place with good suction, no evidence of leak, no drainage in canister EHL/TA/GSC Plantarflexes and dorsiflexes toes Sensation intact to light touch in sural, saphenous, tibial, deep peroneal, and superficial peroneal nerve distributions Foot warm and well perfused   Patient name: Jeremy Grant Patient MRN: [id number] Date: 05/10/23

## 2023-05-17 ENCOUNTER — Ambulatory Visit (INDEPENDENT_AMBULATORY_CARE_PROVIDER_SITE_OTHER): Payer: Medicare Other | Admitting: Family

## 2023-05-17 DIAGNOSIS — S8012XD Contusion of left lower leg, subsequent encounter: Secondary | ICD-10-CM

## 2023-05-20 ENCOUNTER — Other Ambulatory Visit: Payer: Self-pay | Admitting: Orthopedic Surgery

## 2023-05-20 ENCOUNTER — Telehealth: Payer: Self-pay | Admitting: Family

## 2023-05-20 MED ORDER — OXYCODONE-ACETAMINOPHEN 10-325 MG PO TABS: 1.0000 | ORAL_TABLET | Freq: Four times a day (QID) | ORAL | 0 refills | Status: DC | PRN

## 2023-05-20 NOTE — Telephone Encounter (Signed)
 Per pt they saw Rocky and she told them to come in 05/23/23 at 10 AM please advise and if correct add to schedule. Also pt wife changed bandage on wound site looks good but he is in a lot of pain and wife needs assistance with helping manage it also needs refill on Hydromorphone  please advise send to [facility]

## 2023-05-20 NOTE — Telephone Encounter (Signed)
 Appt sch with Dr. Lajoyce Corners for Thursday 05/23/2023 at 10 am and called to advise rx has been sent to pharm. Will call with any questions.

## 2023-05-20 NOTE — Telephone Encounter (Signed)
 Pt is s/p debridement LLE 12/24 and 05/09/2023 please see message below. Requesting a refill on Hydromorphone 2 mg tab last filled 05/10/2023 #6

## 2023-05-23 ENCOUNTER — Ambulatory Visit (INDEPENDENT_AMBULATORY_CARE_PROVIDER_SITE_OTHER): Payer: Medicare Other | Admitting: Orthopedic Surgery

## 2023-05-23 DIAGNOSIS — S8012XD Contusion of left lower leg, subsequent encounter: Secondary | ICD-10-CM

## 2023-05-24 ENCOUNTER — Encounter: Payer: Self-pay | Admitting: Orthopedic Surgery

## 2023-05-24 ENCOUNTER — Encounter: Payer: Self-pay | Admitting: Family

## 2023-05-24 NOTE — Progress Notes (Signed)
 Post-Op Visit Note   Patient: Jeremy Grant           Date of Birth: 19-Nov-1960           MRN: [id number] Visit Date: 05/17/2023 PCP: Burney Darice CROME, MD  Chief Complaint:  Chief Complaint  Patient presents with   Left Leg - Routine Post Op    12/22 and 05/09/23 I&D left leg    HPI:  HPI The patient is a 63 year old gentleman seen status post repeat irrigation debridement of the left lower extremity Ortho Exam On examination left leg the wound is filled in with 100 send bleeding granulation tissue there is no surrounding erythema no maceration no epiboly  Visit Diagnoses: No diagnosis found.  Plan: Begin daily Dial soap cleansing dry dressings change daily or as needed soiled.  Elevate for swelling follow-up in 1 week.  Follow-Up Instructions: No follow-ups on file.   Imaging: No results found.  Orders:  No orders of the defined types were placed in this encounter.  No orders of the defined types were placed in this encounter.    PMFS History: Patient Active Problem List   Diagnosis Date Noted   Hematoma 05/06/2023   Traumatic hematoma of left lower leg 05/04/2023   CKD (chronic kidney disease) stage 4, GFR 15-29 ml/min (HCC) 05/04/2023   Essential hypertension 05/25/2019   Spasm 03/23/2019   Steroid-induced hyperglycemia    Acute on chronic anemia    Seizures (HCC)    Spastic hemiparesis (HCC)    Vasculitis (HCC)    Hypertension    Thrombocytopenia (HCC)    Elevated serum protein level    Multiple myeloma (HCC)    Pancytopenia (HCC)    Normocytic normochromic anemia 07/16/2018   NICM (nonischemic cardiomyopathy) (HCC) 11/12/2013   History of ETOH abuse 11/12/2013   Chronic combined systolic and diastolic CHF (congestive heart failure) (HCC) 11/12/2013   Congestive dilated cardiomyopathy (HCC) 11/04/2013   Past Medical History:  Diagnosis Date   Arthritis    CHF (congestive heart failure) (HCC)    Combined systolic and diastolic cardiac  dysfunction    Echo 11/03/2013 EF 20%, grade 3 diastolic dysfunction   Hypertension    NICM (nonischemic cardiomyopathy) (HCC)    a. L/RHC (11/05/13): RA: 3, RV 52/5, PA 49/19 (31), PCWP 10, AO 166/93, PA 67%, Fick CO/CI: 5.71/2.97, Lmain: normal, LAD: large, without signficant dz, first diagonal has 20% dz at ostium, LCx: normal, RCA: 30% stenosis at the bifurcation of PDA and PLOM   Stroke [facility])     Family History  Problem Relation Age of Onset   Heart attack Father 77   Heart disease Father    Arrhythmia Father    Hypertension Brother    Hypertension Brother     Past Surgical History:  Procedure Laterality Date   BIOPSY  01/31/2019   Procedure: BIOPSY;  Surgeon: Elicia Claw, MD;  Location: [facility] ENDOSCOPY;  Service: Gastroenterology;;   CATARACT EXTRACTION Bilateral 05/2021   ESOPHAGOGASTRODUODENOSCOPY N/A 01/31/2019   Procedure: ESOPHAGOGASTRODUODENOSCOPY (EGD);  Surgeon: Elicia Claw, MD;  Location: [facility] ENDOSCOPY;  Service: Gastroenterology;  Laterality: N/A;   I & D EXTREMITY Left 05/05/2023   Procedure: IRRIGATION AND DEBRIDEMENT LEFT CALF;  Surgeon: Harden Jerona GAILS, MD;  Location: [facility] OR;  Service: Orthopedics;  Laterality: Left;   I & D EXTREMITY Left 05/09/2023   Procedure: IRRIGATION AND DEBRIDEMENT OF LEG;  Surgeon: Georgina Ozell LABOR, MD;  Location: [facility] OR;  Service: Orthopedics;  Laterality: Left;  IR FLUORO GUIDE CV LINE RIGHT  07/25/2018   IR US  GUIDE VASC ACCESS RIGHT  07/25/2018   LEFT AND RIGHT HEART CATHETERIZATION WITH CORONARY ANGIOGRAM N/A 11/05/2013   Procedure: LEFT AND RIGHT HEART CATHETERIZATION WITH CORONARY ANGIOGRAM;  Surgeon: Peter M Jordan, MD;  Location: [facility] CATH LAB;  Service: Cardiovascular;  Laterality: N/A;   RIGHT HEART CATHETERIZATION N/A 01/14/2014   Procedure: RIGHT HEART CATH;  Surgeon: Ezra GORMAN Shuck, MD;  Location: [facility] CATH LAB;  Service: Cardiovascular;  Laterality: N/A;   Social History   Occupational History   Occupation: cabin crew   Tobacco Use   Smoking status: Never   Smokeless tobacco: Never  Substance and Sexual Activity   Alcohol use: Yes    Alcohol/week: 3.0 standard drinks of alcohol    Types: 3 Glasses of wine per week   Drug use: No   Sexual activity: Not Currently    Birth control/protection: None

## 2023-05-24 NOTE — Progress Notes (Signed)
 Office Visit Note   Patient: Jeremy Grant           Date of Birth: 08-Oct-1960           MRN: [id number] Visit Date: 05/23/2023              Requested by: Burney Darice CROME, MD [address] [place] [address] [place],  [place] [address] PCP: Burney Darice CROME, MD  Chief Complaint  Patient presents with   Left Leg - Routine Post Op    05/09/2023 I&D left calf       HPI: Patient is a 63 year old gentleman who is status post serial debridement for hematoma left leg.  Patient states his pain is currently a 6 out of 10.  He is off antibiotics dressing change 2 times a week.  Assessment & Plan: Visit Diagnoses:  1. Traumatic hematoma of left lower leg, subsequent encounter     Plan: Will request authorization for application of Kerecis micro graft in the office.  Follow-Up Instructions: Return in about 1 week (around 05/30/2023).   Ortho Exam  Patient is alert, oriented, no adenopathy, well-dressed, normal affect, normal respiratory effort. Examination patient has healthy granulation tissue in the wound bed.  There is no cellulitis.  There is increased swelling patient has been removing the compression wrap secondary to pain with compression.  Imaging: No results found.   Labs: Lab Results  Component Value Date   HGBA1C 6.1 (H) 05/04/2023   HGBA1C 5.4 01/12/2019   HGBA1C 6.0 (H) 11/02/2013     Lab Results  Component Value Date   ALBUMIN 4.6 05/28/2022   ALBUMIN 4.9 (H) 05/25/2021   ALBUMIN 4.7 04/21/2020    No results found for: MG No results found for: VD25OH  No results found for: PREALBUMIN    Latest Ref Rng & Units 05/10/2023    5:34 AM 05/09/2023    4:31 AM 05/08/2023    4:27 AM  CBC EXTENDED  WBC 4.0 - 10.5 K/uL 5.8  5.0  5.3   RBC 4.22 - 5.81 MIL/uL 2.69  2.87  2.94   Hemoglobin 13.0 - 17.0 g/dL 8.3  8.9  9.1   HCT 60.9 - 52.0 % 24.4  25.8  26.5   Platelets 150 - 400 K/uL 137  121  135      There is no height or weight on file to calculate  BMI.  Orders:  No orders of the defined types were placed in this encounter.  No orders of the defined types were placed in this encounter.    Procedures: No procedures performed  Clinical Data: No additional findings.  ROS:  All other systems negative, except as noted in the HPI. Review of Systems  Objective: Vital Signs: There were no vitals taken for this visit.  Specialty Comments:  No specialty comments available.  PMFS History: Patient Active Problem List   Diagnosis Date Noted   Hematoma 05/06/2023   Traumatic hematoma of left lower leg 05/04/2023   CKD (chronic kidney disease) stage 4, GFR 15-29 ml/min (HCC) 05/04/2023   Essential hypertension 05/25/2019   Spasm 03/23/2019   Steroid-induced hyperglycemia    Acute on chronic anemia    Seizures (HCC)    Spastic hemiparesis (HCC)    Vasculitis (HCC)    Hypertension    Thrombocytopenia (HCC)    Elevated serum protein level    Multiple myeloma (HCC)    Pancytopenia (HCC)    Normocytic normochromic anemia 07/16/2018   NICM (nonischemic  cardiomyopathy) (HCC) 11/12/2013   History of ETOH abuse 11/12/2013   Chronic combined systolic and diastolic CHF (congestive heart failure) (HCC) 11/12/2013   Congestive dilated cardiomyopathy (HCC) 11/04/2013   Past Medical History:  Diagnosis Date   Arthritis    CHF (congestive heart failure) (HCC)    Combined systolic and diastolic cardiac dysfunction    Echo 11/03/2013 EF 20%, grade 3 diastolic dysfunction   Hypertension    NICM (nonischemic cardiomyopathy) (HCC)    a. L/RHC (11/05/13): RA: 3, RV 52/5, PA 49/19 (31), PCWP 10, AO 166/93, PA 67%, Fick CO/CI: 5.71/2.97, Lmain: normal, LAD: large, without signficant dz, first diagonal has 20% dz at ostium, LCx: normal, RCA: 30% stenosis at the bifurcation of PDA and PLOM   Stroke [facility])     Family History  Problem Relation Age of Onset   Heart attack Father 100   Heart disease Father    Arrhythmia Father    Hypertension  Brother    Hypertension Brother     Past Surgical History:  Procedure Laterality Date   BIOPSY  01/31/2019   Procedure: BIOPSY;  Surgeon: Elicia Claw, MD;  Location: [facility] ENDOSCOPY;  Service: Gastroenterology;;   CATARACT EXTRACTION Bilateral 05/2021   ESOPHAGOGASTRODUODENOSCOPY N/A 01/31/2019   Procedure: ESOPHAGOGASTRODUODENOSCOPY (EGD);  Surgeon: Elicia Claw, MD;  Location: [facility] ENDOSCOPY;  Service: Gastroenterology;  Laterality: N/A;   I & D EXTREMITY Left 05/05/2023   Procedure: IRRIGATION AND DEBRIDEMENT LEFT CALF;  Surgeon: Harden Jerona GAILS, MD;  Location: [facility] OR;  Service: Orthopedics;  Laterality: Left;   I & D EXTREMITY Left 05/09/2023   Procedure: IRRIGATION AND DEBRIDEMENT OF LEG;  Surgeon: Georgina Ozell LABOR, MD;  Location: [facility] OR;  Service: Orthopedics;  Laterality: Left;   IR FLUORO GUIDE CV LINE RIGHT  07/25/2018   IR US  GUIDE VASC ACCESS RIGHT  07/25/2018   LEFT AND RIGHT HEART CATHETERIZATION WITH CORONARY ANGIOGRAM N/A 11/05/2013   Procedure: LEFT AND RIGHT HEART CATHETERIZATION WITH CORONARY ANGIOGRAM;  Surgeon: Peter M Jordan, MD;  Location: [facility] CATH LAB;  Service: Cardiovascular;  Laterality: N/A;   RIGHT HEART CATHETERIZATION N/A 01/14/2014   Procedure: RIGHT HEART CATH;  Surgeon: Ezra GORMAN Shuck, MD;  Location: [facility] CATH LAB;  Service: Cardiovascular;  Laterality: N/A;   Social History   Occupational History   Occupation: cabin crew  Tobacco Use   Smoking status: Never   Smokeless tobacco: Never  Substance and Sexual Activity   Alcohol use: Yes    Alcohol/week: 3.0 standard drinks of alcohol    Types: 3 Glasses of wine per week   Drug use: No   Sexual activity: Not Currently    Birth control/protection: None

## 2023-05-30 ENCOUNTER — Ambulatory Visit (INDEPENDENT_AMBULATORY_CARE_PROVIDER_SITE_OTHER): Payer: Medicare Other | Admitting: Orthopedic Surgery

## 2023-05-30 DIAGNOSIS — S8012XD Contusion of left lower leg, subsequent encounter: Secondary | ICD-10-CM

## 2023-06-06 ENCOUNTER — Ambulatory Visit (INDEPENDENT_AMBULATORY_CARE_PROVIDER_SITE_OTHER): Payer: Medicare Other | Admitting: Orthopedic Surgery

## 2023-06-06 DIAGNOSIS — S8012XD Contusion of left lower leg, subsequent encounter: Secondary | ICD-10-CM | POA: Diagnosis not present

## 2023-06-07 ENCOUNTER — Encounter: Payer: Self-pay | Admitting: Orthopedic Surgery

## 2023-06-07 NOTE — Progress Notes (Signed)
Office Visit Note   Patient: Jeremy Grant           Date of Birth: 29-Aug-1960           MRN: [id number] Visit Date: 06/06/2023              Requested by: Dois Davenport, MD [address] [place] [address] [place],  [place] [address] PCP: Dois Davenport, MD  Chief Complaint  Patient presents with   Left Leg - Routine Post Op    05/09/2023 I&D left calf      HPI: Patient is a 63 year old gentleman who presents follow-up status post debridement large hematoma left calf on December 26.  Patient is currently proceeding with Dial soap cleansing and dry dressing changes.  Patient received donated tissue graft on the last visit.  Assessment & Plan: Visit Diagnoses:  1. Traumatic hematoma of left lower leg, subsequent encounter     Plan: Donated Kerecis tissue graft was applied 7 x 10 cm.  Patient was instructed to leave the Adaptic in place change the 4 x 4 gauze and Ace wrap daily.  Follow-Up Instructions: Return in about 1 week (around 06/13/2023).   Ortho Exam  Patient is alert, oriented, no adenopathy, well-dressed, normal affect, normal respiratory effort. Examination the wound bed has healthy granulation tissue there is increased skin wrinkling.  The wound measures 20 x 17 cm.  The wound bed was prepared for the skin graft and a 7 x 10 cm Kerecis solid tissue graft was applied.  Imaging: No results found.   Labs: Lab Results  Component Value Date   HGBA1C 6.1 (H) 05/04/2023   HGBA1C 5.4 01/12/2019   HGBA1C 6.0 (H) 11/02/2013     Lab Results  Component Value Date   ALBUMIN 4.6 05/28/2022   ALBUMIN 4.9 (H) 05/25/2021   ALBUMIN 4.7 04/21/2020    No results found for: "MG" No results found for: "VD25OH"  No results found for: "PREALBUMIN"    Latest Ref Rng & Units 05/10/2023    5:34 AM 05/09/2023    4:31 AM 05/08/2023    4:27 AM  CBC EXTENDED  WBC 4.0 - 10.5 K/uL 5.8  5.0  5.3   RBC 4.22 - 5.81 MIL/uL 2.69  2.87  2.94   Hemoglobin 13.0 - 17.0 g/dL 8.3   8.9  9.1   HCT 93.7 - 52.0 % 24.4  25.8  26.5   Platelets 150 - 400 K/uL 137  121  135      There is no height or weight on file to calculate BMI.  Orders:  No orders of the defined types were placed in this encounter.  No orders of the defined types were placed in this encounter.    Procedures: No procedures performed  Clinical Data: No additional findings.  ROS:  All other systems negative, except as noted in the HPI. Review of Systems  Objective: Vital Signs: There were no vitals taken for this visit.  Specialty Comments:  No specialty comments available.  PMFS History: Patient Active Problem List   Diagnosis Date Noted   Hematoma 05/06/2023   Traumatic hematoma of left lower leg 05/04/2023   CKD (chronic kidney disease) stage 4, GFR 15-29 ml/min (HCC) 05/04/2023   Essential hypertension 05/25/2019   Spasm 03/23/2019   Steroid-induced hyperglycemia    Acute on chronic anemia    Seizures (HCC)    Spastic hemiparesis (HCC)    Vasculitis (HCC)    Hypertension  Thrombocytopenia (HCC)    Elevated serum protein level    Multiple myeloma (HCC)    Pancytopenia (HCC)    Normocytic normochromic anemia 07/16/2018   NICM (nonischemic cardiomyopathy) (HCC) 11/12/2013   History of ETOH abuse 11/12/2013   Chronic combined systolic and diastolic CHF (congestive heart failure) (HCC) 11/12/2013   Congestive dilated cardiomyopathy (HCC) 11/04/2013   Past Medical History:  Diagnosis Date   Arthritis    CHF (congestive heart failure) (HCC)    Combined systolic and diastolic cardiac dysfunction    Echo 11/03/2013 EF 20%, grade 3 diastolic dysfunction   Hypertension    NICM (nonischemic cardiomyopathy) (HCC)    a. L/RHC (11/05/13): RA: 3, RV 52/5, PA 49/19 (31), PCWP 10, AO 166/93, PA 67%, Fick CO/CI: 5.71/2.97, Lmain: normal, LAD: large, without signficant dz, first diagonal has 20% dz at ostium, LCx: normal, RCA: 30% stenosis at the bifurcation of PDA and PLOM   Stroke  [facility])     Family History  Problem Relation Age of Onset   Heart attack Father 14   Heart disease Father    Arrhythmia Father    Hypertension Brother    Hypertension Brother     Past Surgical History:  Procedure Laterality Date   BIOPSY  01/31/2019   Procedure: BIOPSY;  Surgeon: Kathi Der, MD;  Location: [facility] ENDOSCOPY;  Service: Gastroenterology;;   CATARACT EXTRACTION Bilateral 05/2021   ESOPHAGOGASTRODUODENOSCOPY N/A 01/31/2019   Procedure: ESOPHAGOGASTRODUODENOSCOPY (EGD);  Surgeon: Kathi Der, MD;  Location: [facility] ENDOSCOPY;  Service: Gastroenterology;  Laterality: N/A;   I & D EXTREMITY Left 05/05/2023   Procedure: IRRIGATION AND DEBRIDEMENT LEFT CALF;  Surgeon: Nadara Mustard, MD;  Location: [facility] OR;  Service: Orthopedics;  Laterality: Left;   I & D EXTREMITY Left 05/09/2023   Procedure: IRRIGATION AND DEBRIDEMENT OF LEG;  Surgeon: London Sheer, MD;  Location: [facility] OR;  Service: Orthopedics;  Laterality: Left;   IR FLUORO GUIDE CV LINE RIGHT  07/25/2018   IR US GUIDE VASC ACCESS RIGHT  07/25/2018   LEFT AND RIGHT HEART CATHETERIZATION WITH CORONARY ANGIOGRAM N/A 11/05/2013   Procedure: LEFT AND RIGHT HEART CATHETERIZATION WITH CORONARY ANGIOGRAM;  Surgeon: Peter M Swaziland, MD;  Location: [facility] CATH LAB;  Service: Cardiovascular;  Laterality: N/A;   RIGHT HEART CATHETERIZATION N/A 01/14/2014   Procedure: RIGHT HEART CATH;  Surgeon: Laurey Morale, MD;  Location: [facility] CATH LAB;  Service: Cardiovascular;  Laterality: N/A;   Social History   Occupational History   Occupation: Cabin crew  Tobacco Use   Smoking status: Never   Smokeless tobacco: Never  Substance and Sexual Activity   Alcohol use: Yes    Alcohol/week: 3.0 standard drinks of alcohol    Types: 3 Glasses of wine per week   Drug use: No   Sexual activity: Not Currently    Birth control/protection: None

## 2023-06-07 NOTE — Progress Notes (Signed)
Office Visit Note   Patient: Jeremy Grant           Date of Birth: 1960/05/24           MRN: [id number] Visit Date: 05/30/2023              Requested by: Dois Davenport, MD [address] [place] [address] [place],  [place] [address] PCP: Dois Davenport, MD  Chief Complaint  Patient presents with   Left Leg - Routine Post Op    05/09/2023 I&D left calf       HPI: Patient is a 63 year old gentleman is status post irrigation and debridement hematoma with application of Kerecis tissue graft on December 26.  Assessment & Plan: Visit Diagnoses:  1. Traumatic hematoma of left lower leg, subsequent encounter     Plan: Wound bed cleansed with Vashe and donated Kerecis micro graft 38 cm was applied with Adaptic 4 x 4's and an Ace wrap.  Follow-Up Instructions: Return in about 1 week (around 06/06/2023).   Ortho Exam  Patient is alert, oriented, no adenopathy, well-dressed, normal affect, normal respiratory effort. Examination patient has healthy granulation tissue there is no cellulitis or drainage.  The wound measures 16 x 22 cm.  The wound bed was cleansed and donated Kerecis tissue graft was applied with Adaptic 4 x 4's and an Ace wrap.  Imaging: No results found.   Labs: Lab Results  Component Value Date   HGBA1C 6.1 (H) 05/04/2023   HGBA1C 5.4 01/12/2019   HGBA1C 6.0 (H) 11/02/2013     Lab Results  Component Value Date   ALBUMIN 4.6 05/28/2022   ALBUMIN 4.9 (H) 05/25/2021   ALBUMIN 4.7 04/21/2020    No results found for: "MG" No results found for: "VD25OH"  No results found for: "PREALBUMIN"    Latest Ref Rng & Units 05/10/2023    5:34 AM 05/09/2023    4:31 AM 05/08/2023    4:27 AM  CBC EXTENDED  WBC 4.0 - 10.5 K/uL 5.8  5.0  5.3   RBC 4.22 - 5.81 MIL/uL 2.69  2.87  2.94   Hemoglobin 13.0 - 17.0 g/dL 8.3  8.9  9.1   HCT 95.2 - 52.0 % 24.4  25.8  26.5   Platelets 150 - 400 K/uL 137  121  135      There is no height or weight on file to calculate  BMI.  Orders:  No orders of the defined types were placed in this encounter.  No orders of the defined types were placed in this encounter.    Procedures: No procedures performed  Clinical Data: No additional findings.  ROS:  All other systems negative, except as noted in the HPI. Review of Systems  Objective: Vital Signs: There were no vitals taken for this visit.  Specialty Comments:  No specialty comments available.  PMFS History: Patient Active Problem List   Diagnosis Date Noted   Hematoma 05/06/2023   Traumatic hematoma of left lower leg 05/04/2023   CKD (chronic kidney disease) stage 4, GFR 15-29 ml/min (HCC) 05/04/2023   Essential hypertension 05/25/2019   Spasm 03/23/2019   Steroid-induced hyperglycemia    Acute on chronic anemia    Seizures (HCC)    Spastic hemiparesis (HCC)    Vasculitis (HCC)    Hypertension    Thrombocytopenia (HCC)    Elevated serum protein level    Multiple myeloma (HCC)    Pancytopenia (HCC)    Normocytic normochromic anemia 07/16/2018  NICM (nonischemic cardiomyopathy) (HCC) 11/12/2013   History of ETOH abuse 11/12/2013   Chronic combined systolic and diastolic CHF (congestive heart failure) (HCC) 11/12/2013   Congestive dilated cardiomyopathy (HCC) 11/04/2013   Past Medical History:  Diagnosis Date   Arthritis    CHF (congestive heart failure) (HCC)    Combined systolic and diastolic cardiac dysfunction    Echo 11/03/2013 EF 20%, grade 3 diastolic dysfunction   Hypertension    NICM (nonischemic cardiomyopathy) (HCC)    a. L/RHC (11/05/13): RA: 3, RV 52/5, PA 49/19 (31), PCWP 10, AO 166/93, PA 67%, Fick CO/CI: 5.71/2.97, Lmain: normal, LAD: large, without signficant dz, first diagonal has 20% dz at ostium, LCx: normal, RCA: 30% stenosis at the bifurcation of PDA and PLOM   Stroke [facility])     Family History  Problem Relation Age of Onset   Heart attack Father 21   Heart disease Father    Arrhythmia Father    Hypertension  Brother    Hypertension Brother     Past Surgical History:  Procedure Laterality Date   BIOPSY  01/31/2019   Procedure: BIOPSY;  Surgeon: Kathi Der, MD;  Location: [facility] ENDOSCOPY;  Service: Gastroenterology;;   CATARACT EXTRACTION Bilateral 05/2021   ESOPHAGOGASTRODUODENOSCOPY N/A 01/31/2019   Procedure: ESOPHAGOGASTRODUODENOSCOPY (EGD);  Surgeon: Kathi Der, MD;  Location: [facility] ENDOSCOPY;  Service: Gastroenterology;  Laterality: N/A;   I & D EXTREMITY Left 05/05/2023   Procedure: IRRIGATION AND DEBRIDEMENT LEFT CALF;  Surgeon: Nadara Mustard, MD;  Location: [facility] OR;  Service: Orthopedics;  Laterality: Left;   I & D EXTREMITY Left 05/09/2023   Procedure: IRRIGATION AND DEBRIDEMENT OF LEG;  Surgeon: London Sheer, MD;  Location: [facility] OR;  Service: Orthopedics;  Laterality: Left;   IR FLUORO GUIDE CV LINE RIGHT  07/25/2018   IR US GUIDE VASC ACCESS RIGHT  07/25/2018   LEFT AND RIGHT HEART CATHETERIZATION WITH CORONARY ANGIOGRAM N/A 11/05/2013   Procedure: LEFT AND RIGHT HEART CATHETERIZATION WITH CORONARY ANGIOGRAM;  Surgeon: Peter M Swaziland, MD;  Location: [facility] CATH LAB;  Service: Cardiovascular;  Laterality: N/A;   RIGHT HEART CATHETERIZATION N/A 01/14/2014   Procedure: RIGHT HEART CATH;  Surgeon: Laurey Morale, MD;  Location: [facility] CATH LAB;  Service: Cardiovascular;  Laterality: N/A;   Social History   Occupational History   Occupation: Cabin crew  Tobacco Use   Smoking status: Never   Smokeless tobacco: Never  Substance and Sexual Activity   Alcohol use: Yes    Alcohol/week: 3.0 standard drinks of alcohol    Types: 3 Glasses of wine per week   Drug use: No   Sexual activity: Not Currently    Birth control/protection: None

## 2023-06-12 ENCOUNTER — Telehealth: Payer: Self-pay

## 2023-06-12 NOTE — Telephone Encounter (Signed)
Insurance has denied in office application of graft. You can do a P2P by calling [phone] REF# [id number]. This is an [facility] pt and we have applied 2 applications of donation graft given by Carmell Austria. I will see if I can get him to donate another piece while we await the P2P

## 2023-06-14 ENCOUNTER — Ambulatory Visit (INDEPENDENT_AMBULATORY_CARE_PROVIDER_SITE_OTHER): Payer: Medicare Other | Admitting: Family

## 2023-06-14 DIAGNOSIS — S8012XD Contusion of left lower leg, subsequent encounter: Secondary | ICD-10-CM

## 2023-06-18 ENCOUNTER — Encounter: Payer: Self-pay | Admitting: Family

## 2023-06-18 NOTE — Progress Notes (Signed)
Office Visit Note   Patient: Jeremy Grant           Date of Birth: 07-Sep-1960           MRN: [id number] Visit Date: 06/14/2023              Requested by: Dois Davenport, MD [address] [place] [address] [place],  [place] [address] PCP: Dois Davenport, MD  Chief Complaint  Patient presents with   Left Leg - Routine Post Op    05/09/2023 I&D left calf      HPI: Patient is a 63 year old gentleman who presents follow-up status post debridement large hematoma left calf on December 26.    Patient received donated tissue graft (7x10) on the last visit.  Assessment & Plan: Visit Diagnoses:  No diagnosis found.   Plan: Donated Kerecis tissue graft micro powder 19 applied today..  Patient was instructed to leave the Adaptic in place change the 4 x 4 gauze and Ace wrap daily.  Follow-Up Instructions: No follow-ups on file.   Ortho Exam  Patient is alert, oriented, no adenopathy, well-dressed, normal affect, normal respiratory effort. Examination the wound bed has healthy granulation tissue with bloody drainage.  The wound bed was prepared for the skin graft and a micro 19 kerecis tissue graft was applied.  Imaging: No results found.   Labs: Lab Results  Component Value Date   HGBA1C 6.1 (H) 05/04/2023   HGBA1C 5.4 01/12/2019   HGBA1C 6.0 (H) 11/02/2013     Lab Results  Component Value Date   ALBUMIN 4.6 05/28/2022   ALBUMIN 4.9 (H) 05/25/2021   ALBUMIN 4.7 04/21/2020    No results found for: "MG" No results found for: "VD25OH"  No results found for: "PREALBUMIN"    Latest Ref Rng & Units 05/10/2023    5:34 AM 05/09/2023    4:31 AM 05/08/2023    4:27 AM  CBC EXTENDED  WBC 4.0 - 10.5 K/uL 5.8  5.0  5.3   RBC 4.22 - 5.81 MIL/uL 2.69  2.87  2.94   Hemoglobin 13.0 - 17.0 g/dL 8.3  8.9  9.1   HCT 19.1 - 52.0 % 24.4  25.8  26.5   Platelets 150 - 400 K/uL 137  121  135      There is no height or weight on file to calculate BMI.  Orders:  No orders of the  defined types were placed in this encounter.  No orders of the defined types were placed in this encounter.    Procedures: No procedures performed  Clinical Data: No additional findings.  ROS:  All other systems negative, except as noted in the HPI. Review of Systems  Objective: Vital Signs: There were no vitals taken for this visit.  Specialty Comments:  No specialty comments available.  PMFS History: Patient Active Problem List   Diagnosis Date Noted   Hematoma 05/06/2023   Traumatic hematoma of left lower leg 05/04/2023   CKD (chronic kidney disease) stage 4, GFR 15-29 ml/min (HCC) 05/04/2023   Essential hypertension 05/25/2019   Spasm 03/23/2019   Steroid-induced hyperglycemia    Acute on chronic anemia    Seizures (HCC)    Spastic hemiparesis (HCC)    Vasculitis (HCC)    Hypertension    Thrombocytopenia (HCC)    Elevated serum protein level    Multiple myeloma (HCC)    Pancytopenia (HCC)    Normocytic normochromic anemia 07/16/2018   NICM (nonischemic cardiomyopathy) (HCC) 11/12/2013  History of ETOH abuse 11/12/2013   Chronic combined systolic and diastolic CHF (congestive heart failure) (HCC) 11/12/2013   Congestive dilated cardiomyopathy (HCC) 11/04/2013   Past Medical History:  Diagnosis Date   Arthritis    CHF (congestive heart failure) (HCC)    Combined systolic and diastolic cardiac dysfunction    Echo 11/03/2013 EF 20%, grade 3 diastolic dysfunction   Hypertension    NICM (nonischemic cardiomyopathy) (HCC)    a. L/RHC (11/05/13): RA: 3, RV 52/5, PA 49/19 (31), PCWP 10, AO 166/93, PA 67%, Fick CO/CI: 5.71/2.97, Lmain: normal, LAD: large, without signficant dz, first diagonal has 20% dz at ostium, LCx: normal, RCA: 30% stenosis at the bifurcation of PDA and PLOM   Stroke [facility])     Family History  Problem Relation Age of Onset   Heart attack Father 73   Heart disease Father    Arrhythmia Father    Hypertension Brother    Hypertension Brother      Past Surgical History:  Procedure Laterality Date   BIOPSY  01/31/2019   Procedure: BIOPSY;  Surgeon: Kathi Der, MD;  [facility] ENDOSCOPY;  Service: Gastroenterology;;   CATARACT EXTRACTION Bilateral 05/2021   ESOPHAGOGASTRODUODENOSCOPY N/A 01/31/2019   Procedure: ESOPHAGOGASTRODUODENOSCOPY (EGD);  Surgeon: Kathi Der, MD;  Location: [facility] ENDOSCOPY;  Service: Gastroenterology;  Laterality: N/A;   I & D EXTREMITY Left 05/05/2023   Procedure: IRRIGATION AND DEBRIDEMENT LEFT CALF;  Surgeon: Nadara Mustard, MD;  Location[facility];  Service: Orthopedics;  Laterality: Left;   I & D EXTREMITY Left 05/09/2023   Procedure: IRRIGATION AND DEBRIDEMENT OF LEG;  Surgeon: London Sheer, MD;  [facility] OR;  Service: Orthopedics;  Laterality: Left;   IR FLUORO GUIDE CV LINE RIGHT  07/25/2018   IR US GUIDE VASC ACCESS RIGHT  07/25/2018   LEFT AND RIGHT HEART CATHETERIZATION WITH CORONARY ANGIOGRAM N/A 11/05/2013   Procedure: LEFT AND RIGHT HEART CATHETERIZATION WITH CORONARY ANGIOGRAM;  Surgeon: Peter M Swaziland, MD;  Location: [facility] CATH LAB;  Service: Cardiovascular;  Laterality: N/A;   RIGHT HEART CATHETERIZATION N/A 01/14/2014   Procedure: RIGHT HEART CATH;  Surgeon: Laurey Morale, MD;  Location: [facility] CATH LAB;  Service: Cardiovascular;  Laterality: N/A;   Social History   Occupational History   Occupation: Cabin crew  Tobacco Use   Smoking status: Never   Smokeless tobacco: Never  Substance and Sexual Activity   Alcohol use: Yes    Alcohol/week: 3.0 standard drinks of alcohol    Types: 3 Glasses of wine per week   Drug use: No   Sexual activity: Not Currently    Birth control/protection: None

## 2023-06-20 ENCOUNTER — Ambulatory Visit (INDEPENDENT_AMBULATORY_CARE_PROVIDER_SITE_OTHER): Payer: Medicare Other | Admitting: Orthopedic Surgery

## 2023-06-20 DIAGNOSIS — S8012XD Contusion of left lower leg, subsequent encounter: Secondary | ICD-10-CM

## 2023-06-24 ENCOUNTER — Telehealth: Payer: Self-pay | Admitting: Orthopedic Surgery

## 2023-06-24 ENCOUNTER — Encounter: Payer: Self-pay | Admitting: Orthopedic Surgery

## 2023-06-24 NOTE — Telephone Encounter (Signed)
 Called and made appt for Thursday at 10:45

## 2023-06-24 NOTE — Telephone Encounter (Signed)
 Pt's wife Cornelius Dill called Dr Julio Ohm wanted pt to come in Thursday morning for bandage change, Please call wife at [phone]

## 2023-06-24 NOTE — Progress Notes (Signed)
 Office Visit Note   Patient: Jeremy Grant           Date of Birth: [birth year]           MRN: [id number] Visit Date: 06/20/2023              Requested by: Burney Darice CROME, MD [address] [place] [address] [place],  [place] [address] PCP: Burney Darice CROME, MD  Chief Complaint  Patient presents with   Left Leg - Routine Post Op    05/09/2023 I&D left calf      HPI: Patient is a 63 year old gentleman who is status post debridement left calf hematoma wound December 26.  Patient had donated micro graft applied.  Assessment & Plan: Visit Diagnoses:  1. Traumatic hematoma of left lower leg, subsequent encounter     Plan: Donated micro graft applied today.  Continue with compression reevaluate in 1 week.  Follow-Up Instructions: Return in about 1 week (around 06/27/2023).   Ortho Exam  Patient is alert, oriented, no adenopathy, well-dressed, normal affect, normal respiratory effort. Examination patient has a large wound involving the anterior lateral aspect of his leg.  There is healthy granulation tissue no cellulitis no drainage no exposed bone or tendon.  Imaging: No results found.     Labs: Lab Results  Component Value Date   HGBA1C 6.1 (H) 05/04/2023   HGBA1C 5.4 01/12/2019   HGBA1C 6.0 (H) 11/02/2013     Lab Results  Component Value Date   ALBUMIN 4.6 05/28/2022   ALBUMIN 4.9 (H) 05/25/2021   ALBUMIN 4.7 04/21/2020    No results found for: MG No results found for: VD25OH  No results found for: PREALBUMIN    Latest Ref Rng & Units 05/10/2023    5:34 AM 05/09/2023    4:31 AM 05/08/2023    4:27 AM  CBC EXTENDED  WBC 4.0 - 10.5 K/uL 5.8  5.0  5.3   RBC 4.22 - 5.81 MIL/uL 2.69  2.87  2.94   Hemoglobin 13.0 - 17.0 g/dL 8.3  8.9  9.1   HCT 60.9 - 52.0 % 24.4  25.8  26.5   Platelets 150 - 400 K/uL 137  121  135      There is no height or weight on file to calculate BMI.  Orders:  No orders of the defined types were placed in this  encounter.  No orders of the defined types were placed in this encounter.    Procedures: No procedures performed  Clinical Data: No additional findings.  ROS:  All other systems negative, except as noted in the HPI. Review of Systems  Objective: Vital Signs: There were no vitals taken for this visit.  Specialty Comments:  No specialty comments available.  PMFS History: Patient Active Problem List   Diagnosis Date Noted   Hematoma 05/06/2023   Traumatic hematoma of left lower leg 05/04/2023   CKD (chronic kidney disease) stage 4, GFR 15-29 ml/min (HCC) 05/04/2023   Essential hypertension 05/25/2019   Spasm 03/23/2019   Steroid-induced hyperglycemia    Acute on chronic anemia    Seizures (HCC)    Spastic hemiparesis (HCC)    Vasculitis (HCC)    Hypertension    Thrombocytopenia (HCC)    Elevated serum protein level    Multiple myeloma (HCC)    Pancytopenia (HCC)    Normocytic normochromic anemia 07/16/2018   NICM (nonischemic cardiomyopathy) (HCC) 11/12/2013   History of ETOH abuse 11/12/2013   Chronic combined systolic and  diastolic CHF (congestive heart failure) (HCC) 11/12/2013   Congestive dilated cardiomyopathy (HCC) 11/04/2013   Past Medical History:  Diagnosis Date   Arthritis    CHF (congestive heart failure) (HCC)    Combined systolic and diastolic cardiac dysfunction    Echo 11/03/2013 EF 20%, grade 3 diastolic dysfunction   Hypertension    NICM (nonischemic cardiomyopathy) (HCC)    a. L/RHC (11/05/13): RA: 3, RV 52/5, PA 49/19 (31), PCWP 10, AO 166/93, PA 67%, Fick CO/CI: 5.71/2.97, Lmain: normal, LAD: large, without signficant dz, first diagonal has 20% dz at ostium, LCx: normal, RCA: 30% stenosis at the bifurcation of PDA and PLOM   Stroke [facility])     Family History  Problem Relation Age of Onset   Heart attack Father 26   Heart disease Father    Arrhythmia Father    Hypertension Brother    Hypertension Brother     Past Surgical History:   Procedure Laterality Date   BIOPSY  01/31/2019   Procedure: BIOPSY;  Surgeon: Elicia Claw, MD;  [facility] ENDOSCOPY;  Service: Gastroenterology;;   CATARACT EXTRACTION Bilateral 05/2021   ESOPHAGOGASTRODUODENOSCOPY N/A 01/31/2019   Procedure: ESOPHAGOGASTRODUODENOSCOPY (EGD);  Surgeon: Elicia Claw, MD;  Location: [facility] ENDOSCOPY;  Service: Gastroenterology;  Laterality: N/A;   I & D EXTREMITY Left 05/05/2023   Procedure: IRRIGATION AND DEBRIDEMENT LEFT CALF;  Surgeon: Harden Jerona GAILS, MD;  Location: [facility] OR;  Service: Orthopedics;  Laterality: Left;   I & D EXTREMITY Left 05/09/2023   Procedure: IRRIGATION AND DEBRIDEMENT OF LEG;  Surgeon: Georgina Ozell LABOR, MD;  Location: [facility] OR;  Service: Orthopedics;  Laterality: Left;   IR FLUORO GUIDE CV LINE RIGHT  07/25/2018   IR US  GUIDE VASC ACCESS RIGHT  07/25/2018   LEFT AND RIGHT HEART CATHETERIZATION WITH CORONARY ANGIOGRAM N/A 11/05/2013   Procedure: LEFT AND RIGHT HEART CATHETERIZATION WITH CORONARY ANGIOGRAM;  Surgeon: Peter M Jordan, MD;  Location: [facility] CATH LAB;  Service: Cardiovascular;  Laterality: N/A;   RIGHT HEART CATHETERIZATION N/A 01/14/2014   Procedure: RIGHT HEART CATH;  Surgeon: Ezra GORMAN Shuck, MD;  Location: [facility] CATH LAB;  Service: Cardiovascular;  Laterality: N/A;   Social History   Occupational History   Occupation: cabin crew  Tobacco Use   Smoking status: Never   Smokeless tobacco: Never  Substance and Sexual Activity   Alcohol use: Yes    Alcohol/week: 3.0 standard drinks of alcohol    Types: 3 Glasses of wine per week   Drug use: No   Sexual activity: Not Currently    Birth control/protection: None

## 2023-06-27 ENCOUNTER — Ambulatory Visit (INDEPENDENT_AMBULATORY_CARE_PROVIDER_SITE_OTHER): Payer: Medicare Other | Admitting: Orthopedic Surgery

## 2023-06-27 ENCOUNTER — Encounter: Payer: Self-pay | Admitting: Orthopedic Surgery

## 2023-06-27 DIAGNOSIS — S8012XD Contusion of left lower leg, subsequent encounter: Secondary | ICD-10-CM

## 2023-06-27 NOTE — Progress Notes (Signed)
Office Visit Note   Patient: Jeremy Grant           Date of Birth: March 07, 1961           MRN: [id number] Visit Date: 06/27/2023              Requested by: Dois Davenport, MD [address] [place] [address] [place],  [place] [address] PCP: Dois Davenport, MD  Chief Complaint  Patient presents with   Left Leg - Routine Post Op    05/09/2023 I&D left calf      HPI: Patient is a 63 year old gentleman who presents 6 weeks status post irrigation and debridement hematoma left calf.  Patient had Kerecis micro applied last visit.  Assessment & Plan: Visit Diagnoses:  1. Traumatic hematoma of left lower leg, subsequent encounter     Plan: Will have patient start with Vashe dressing changes daily.  Recommended showering with Castile soap.  Follow-Up Instructions: Return in about 2 weeks (around 07/11/2023).   Ortho Exam  Patient is alert, oriented, no adenopathy, well-dressed, normal affect, normal respiratory effort. Examination the wound has healthy granulation tissue there is no cellulitis the wound measures 15 x 19 cm.  Imaging: No results found.     Labs: Lab Results  Component Value Date   HGBA1C 6.1 (H) 05/04/2023   HGBA1C 5.4 01/12/2019   HGBA1C 6.0 (H) 11/02/2013     Lab Results  Component Value Date   ALBUMIN 4.6 05/28/2022   ALBUMIN 4.9 (H) 05/25/2021   ALBUMIN 4.7 04/21/2020    No results found for: "MG" No results found for: "VD25OH"  No results found for: "PREALBUMIN"    Latest Ref Rng & Units 05/10/2023    5:34 AM 05/09/2023    4:31 AM 05/08/2023    4:27 AM  CBC EXTENDED  WBC 4.0 - 10.5 K/uL 5.8  5.0  5.3   RBC 4.22 - 5.81 MIL/uL 2.69  2.87  2.94   Hemoglobin 13.0 - 17.0 g/dL 8.3  8.9  9.1   HCT 63.0 - 52.0 % 24.4  25.8  26.5   Platelets 150 - 400 K/uL 137  121  135      There is no height or weight on file to calculate BMI.  Orders:  No orders of the defined types were placed in this encounter.  No orders of the defined types were  placed in this encounter.    Procedures: No procedures performed  Clinical Data: No additional findings.  ROS:  All other systems negative, except as noted in the HPI. Review of Systems  Objective: Vital Signs: There were no vitals taken for this visit.  Specialty Comments:  No specialty comments available.  PMFS History: Patient Active Problem List   Diagnosis Date Noted   Hematoma 05/06/2023   Traumatic hematoma of left lower leg 05/04/2023   CKD (chronic kidney disease) stage 4, GFR 15-29 ml/min (HCC) 05/04/2023   Essential hypertension 05/25/2019   Spasm 03/23/2019   Steroid-induced hyperglycemia    Acute on chronic anemia    Seizures (HCC)    Spastic hemiparesis (HCC)    Vasculitis (HCC)    Hypertension    Thrombocytopenia (HCC)    Elevated serum protein level    Multiple myeloma (HCC)    Pancytopenia (HCC)    Normocytic normochromic anemia 07/16/2018   NICM (nonischemic cardiomyopathy) (HCC) 11/12/2013   History of ETOH abuse 11/12/2013   Chronic combined systolic and diastolic CHF (congestive heart failure) (HCC) 11/12/2013  Congestive dilated cardiomyopathy (HCC) 11/04/2013   Past Medical History:  Diagnosis Date   Arthritis    CHF (congestive heart failure) (HCC)    Combined systolic and diastolic cardiac dysfunction    Echo 11/03/2013 EF 20%, grade 3 diastolic dysfunction   Hypertension    NICM (nonischemic cardiomyopathy) (HCC)    a. L/RHC (11/05/13): RA: 3, RV 52/5, PA 49/19 (31), PCWP 10, AO 166/93, PA 67%, Fick CO/CI: 5.71/2.97, Lmain: normal, LAD: large, without signficant dz, first diagonal has 20% dz at ostium, LCx: normal, RCA: 30% stenosis at the bifurcation of PDA and PLOM   Stroke [facility])     Family History  Problem Relation Age of Onset   Heart attack Father 52   Heart disease Father    Arrhythmia Father    Hypertension Brother    Hypertension Brother     Past Surgical History:  Procedure Laterality Date   BIOPSY  01/31/2019    Procedure: BIOPSY;  Surgeon: Kathi Der, MD;  [facility]: [facility] ENDOSCOPY;  Service: Gastroenterology;;   CATARACT EXTRACTION Bilateral 05/2021   ESOPHAGOGASTRODUODENOSCOPY N/A 01/31/2019   Procedure: ESOPHAGOGASTRODUODENOSCOPY (EGD);  Surgeon: Kathi Der, MD;  Location: [facility] ENDOSCOPY;  Service: Gastroenterology;  Laterality: N/A;   I & D EXTREMITY Left 05/05/2023   Procedure: IRRIGATION AND DEBRIDEMENT LEFT CALF;  Surgeon: Nadara Mustard, MD;  Location: [facility] OR;  Service: Orthopedics;  Laterality: Left;   I & D EXTREMITY Left 05/09/2023   Procedure: IRRIGATION AND DEBRIDEMENT OF LEG;  Surgeon: London Sheer, MD;  Location: [facility] OR;  Service: Orthopedics;  Laterality: Left;   IR FLUORO GUIDE CV LINE RIGHT  07/25/2018   IR US GUIDE VASC ACCESS RIGHT  07/25/2018   LEFT AND RIGHT HEART CATHETERIZATION WITH CORONARY ANGIOGRAM N/A 11/05/2013   Procedure: LEFT AND RIGHT HEART CATHETERIZATION WITH CORONARY ANGIOGRAM;  Surgeon: Peter M Swaziland, MD;  Location: [facility] CATH LAB;  Service: Cardiovascular;  Laterality: N/A;   RIGHT HEART CATHETERIZATION N/A 01/14/2014   Procedure: RIGHT HEART CATH;  Surgeon: Laurey Morale, MD;  Location: [facility] CATH LAB;  Service: Cardiovascular;  Laterality: N/A;   Social History   Occupational History   Occupation: Cabin crew  Tobacco Use   Smoking status: Never   Smokeless tobacco: Never  Substance and Sexual Activity   Alcohol use: Yes    Alcohol/week: 3.0 standard drinks of alcohol    Types: 3 Glasses of wine per week   Drug use: No   Sexual activity: Not Currently    Birth control/protection: None

## 2023-07-11 ENCOUNTER — Ambulatory Visit: Payer: Medicare Other | Admitting: Orthopedic Surgery

## 2023-07-11 DIAGNOSIS — S8012XD Contusion of left lower leg, subsequent encounter: Secondary | ICD-10-CM

## 2023-07-12 ENCOUNTER — Telehealth: Payer: Self-pay | Admitting: Orthopedic Surgery

## 2023-07-12 ENCOUNTER — Other Ambulatory Visit: Payer: Self-pay | Admitting: Orthopedic Surgery

## 2023-07-12 MED ORDER — VASHE WOUND 0.033 % EX SOLN: 10.0000 mL | Freq: Every day | CUTANEOUS | 3 refills | Status: AC

## 2023-07-12 NOTE — Telephone Encounter (Signed)
 Patient called stating he needs the cleaning solution prescription sent to pharmacy for his wounds please. Jeremy Grant

## 2023-07-12 NOTE — Telephone Encounter (Signed)
 Vashe wound solution sent to pharmacy

## 2023-07-20 ENCOUNTER — Encounter: Payer: Self-pay | Admitting: Orthopedic Surgery

## 2023-07-20 NOTE — Progress Notes (Signed)
 Office Visit Note   Patient: Jeremy Grant           Date of Birth: 20-Jul-1960           MRN: [id number] Visit Date: 07/11/2023              Requested by: Dois Davenport, MD [address] [place] [address] [place],  [place] [address] PCP: Dois Davenport, MD  Chief Complaint  Patient presents with   Left Leg - Routine Post Op    05/09/2023 I&D left calf      HPI: Patient is 2 months status post irrigation debridement left calf hematoma.  Patient is currently using Vashe dressing changes daily with Castile soap  Assessment & Plan: Visit Diagnoses:  1. Traumatic hematoma of left lower leg, subsequent encounter     Plan: Recommended protein supplements continue current wound care  Follow-Up Instructions: Return in about 3 weeks (around 08/01/2023).   Ortho Exam  Patient is alert, oriented, no adenopathy, well-dressed, normal affect, normal respiratory effort. Examination the wound bed is flat healthy granulation tissue.  The wound measures 13 x 19 cm.  Imaging: No results found.   Labs: Lab Results  Component Value Date   HGBA1C 6.1 (H) 05/04/2023   HGBA1C 5.4 01/12/2019   HGBA1C 6.0 (H) 11/02/2013     Lab Results  Component Value Date   ALBUMIN 4.6 05/28/2022   ALBUMIN 4.9 (H) 05/25/2021   ALBUMIN 4.7 04/21/2020    No results found for: "MG" No results found for: "VD25OH"  No results found for: "PREALBUMIN"    Latest Ref Rng & Units 05/10/2023    5:34 AM 05/09/2023    4:31 AM 05/08/2023    4:27 AM  CBC EXTENDED  WBC 4.0 - 10.5 K/uL 5.8  5.0  5.3   RBC 4.22 - 5.81 MIL/uL 2.69  2.87  2.94   Hemoglobin 13.0 - 17.0 g/dL 8.3  8.9  9.1   HCT 52.8 - 52.0 % 24.4  25.8  26.5   Platelets 150 - 400 K/uL 137  121  135      There is no height or weight on file to calculate BMI.  Orders:  No orders of the defined types were placed in this encounter.  No orders of the defined types were placed in this encounter.    Procedures: No procedures  performed  Clinical Data: No additional findings.  ROS:  All other systems negative, except as noted in the HPI. Review of Systems  Objective: Vital Signs: There were no vitals taken for this visit.  Specialty Comments:  No specialty comments available.  PMFS History: Patient Active Problem List   Diagnosis Date Noted   Hematoma 05/06/2023   Traumatic hematoma of left lower leg 05/04/2023   CKD (chronic kidney disease) stage 4, GFR 15-29 ml/min (HCC) 05/04/2023   Essential hypertension 05/25/2019   Spasm 03/23/2019   Steroid-induced hyperglycemia    Acute on chronic anemia    Seizures (HCC)    Spastic hemiparesis (HCC)    Vasculitis (HCC)    Hypertension    Thrombocytopenia (HCC)    Elevated serum protein level    Multiple myeloma (HCC)    Pancytopenia (HCC)    Normocytic normochromic anemia 07/16/2018   NICM (nonischemic cardiomyopathy) (HCC) 11/12/2013   History of ETOH abuse 11/12/2013   Chronic combined systolic and diastolic CHF (congestive heart failure) (HCC) 11/12/2013   Congestive dilated cardiomyopathy (HCC) 11/04/2013   Past Medical History:  Diagnosis Date   Arthritis    CHF (congestive heart failure) (HCC)    Combined systolic and diastolic cardiac dysfunction    Echo 11/03/2013 EF 20%, grade 3 diastolic dysfunction   Hypertension    NICM (nonischemic cardiomyopathy) (HCC)    a. L/RHC (11/05/13): RA: 3, RV 52/5, PA 49/19 (31), PCWP 10, AO 166/93, PA 67%, Fick CO/CI: 5.71/2.97, Lmain: normal, LAD: large, without signficant dz, first diagonal has 20% dz at ostium, LCx: normal, RCA: 30% stenosis at the bifurcation of PDA and PLOM   Stroke [facility])     Family History  Problem Relation Age of Onset   Heart attack Father 58   Heart disease Father    Arrhythmia Father    Hypertension Brother    Hypertension Brother     Past Surgical History:  Procedure Laterality Date   BIOPSY  01/31/2019   Procedure: BIOPSY;  Surgeon: Kathi Der, MD;  [facility] ENDOSCOPY;  Service: Gastroenterology;;   CATARACT EXTRACTION Bilateral 05/2021   ESOPHAGOGASTRODUODENOSCOPY N/A 01/31/2019   Procedure: ESOPHAGOGASTRODUODENOSCOPY (EGD);  Surgeon: Kathi Der, MD;  Location: [facility] ENDOSCOPY;  Service: Gastroenterology;  Laterality: N/A;   I & D EXTREMITY Left 05/05/2023   Procedure: IRRIGATION AND DEBRIDEMENT LEFT CALF;  Surgeon: Nadara Mustard, MD;  Location: [facility] OR;  Service: Orthopedics;  Laterality: Left;   I & D EXTREMITY Left 05/09/2023   Procedure: IRRIGATION AND DEBRIDEMENT OF LEG;  Surgeon: London Sheer, MD;  [facility] OR;  Service: Orthopedics;  Laterality: Left;   IR FLUORO GUIDE CV LINE RIGHT  07/25/2018   IR US GUIDE VASC ACCESS RIGHT  07/25/2018   LEFT AND RIGHT HEART CATHETERIZATION WITH CORONARY ANGIOGRAM N/A 11/05/2013   Procedure: LEFT AND RIGHT HEART CATHETERIZATION WITH CORONARY ANGIOGRAM;  Surgeon: Peter M Swaziland, MD;  Location: [facility] CATH LAB;  Service: Cardiovascular;  Laterality: N/A;   RIGHT HEART CATHETERIZATION N/A 01/14/2014   Procedure: RIGHT HEART CATH;  Surgeon: Laurey Morale, MD;  Location: [facility] CATH LAB;  Service: Cardiovascular;  Laterality: N/A;   Social History   Occupational History   Occupation: Cabin crew  Tobacco Use   Smoking status: Never   Smokeless tobacco: Never  Substance and Sexual Activity   Alcohol use: Yes    Alcohol/week: 3.0 standard drinks of alcohol    Types: 3 Glasses of wine per week   Drug use: No   Sexual activity: Not Currently    Birth control/protection: None

## 2023-08-01 ENCOUNTER — Ambulatory Visit: Payer: Medicare Other | Admitting: Orthopedic Surgery

## 2023-08-01 DIAGNOSIS — S8012XD Contusion of left lower leg, subsequent encounter: Secondary | ICD-10-CM

## 2023-08-04 ENCOUNTER — Encounter: Payer: Self-pay | Admitting: Orthopedic Surgery

## 2023-08-04 NOTE — Progress Notes (Signed)
 Office Visit Note   Patient: Jeremy Grant           Date of Birth: May 03, 1961           MRN: [id number] Visit Date: 08/01/2023              Requested by: Dois Davenport, MD [address] [place],  [place] [address] PCP: Dois Davenport, MD  Chief Complaint  Patient presents with   Left Leg - Routine Post Op    05/09/2023 I&D left calf      HPI: Patient is a 63 year old gentleman who is 3 months status post irrigation debridement left calf hematoma.  Patient currently washing with Castile soap and Vashe dressing changes.  Assessment & Plan: Visit Diagnoses:  1. Traumatic hematoma of left lower leg, subsequent encounter     Plan: Continue current wound care.  Follow-Up Instructions: Return in about 4 weeks (around 08/29/2023).   Ortho Exam  Patient is alert, oriented, no adenopathy, well-dressed, normal affect, normal respiratory effort. Examination the wound has flat healthy granulation tissue there is superficial epithelialization around the edges.  Wound currently measures 10 x 15 cm.  There is no cellulitis or drainage.  Imaging: No results found.    Labs: Lab Results  Component Value Date   HGBA1C 6.1 (H) 05/04/2023   HGBA1C 5.4 01/12/2019   HGBA1C 6.0 (H) 11/02/2013     Lab Results  Component Value Date   ALBUMIN 4.6 05/28/2022   ALBUMIN 4.9 (H) 05/25/2021   ALBUMIN 4.7 04/21/2020    No results found for: "MG" No results found for: "VD25OH"  No results found for: "PREALBUMIN"    Latest Ref Rng & Units 05/10/2023    5:34 AM 05/09/2023    4:31 AM 05/08/2023    4:27 AM  CBC EXTENDED  WBC 4.0 - 10.5 K/uL 5.8  5.0  5.3   RBC 4.22 - 5.81 MIL/uL 2.69  2.87  2.94   Hemoglobin 13.0 - 17.0 g/dL 8.3  8.9  9.1   HCT 51.8 - 52.0 % 24.4  25.8  26.5   Platelets 150 - 400 K/uL 137  121  135      There is no height or weight on file to calculate BMI.  Orders:  No orders of the defined types were placed in this encounter.  No orders  of the defined types were placed in this encounter.    Procedures: No procedures performed  Clinical Data: No additional findings.  ROS:  All other systems negative, except as noted in the HPI. Review of Systems  Objective: Vital Signs: There were no vitals taken for this visit.  Specialty Comments:  No specialty comments available.  PMFS History: Patient Active Problem List   Diagnosis Date Noted   Hematoma 05/06/2023   Traumatic hematoma of left lower leg 05/04/2023   CKD (chronic kidney disease) stage 4, GFR 15-29 ml/min (HCC) 05/04/2023   Essential hypertension 05/25/2019   Spasm 03/23/2019   Steroid-induced hyperglycemia    Acute on chronic anemia    Seizures (HCC)    Spastic hemiparesis (HCC)    Vasculitis (HCC)    Hypertension    Thrombocytopenia (HCC)    Elevated serum protein level    Multiple myeloma (HCC)    Pancytopenia (HCC)    Normocytic normochromic anemia 07/16/2018   NICM (nonischemic cardiomyopathy) (HCC) 11/12/2013   History of ETOH abuse 11/12/2013   Chronic combined systolic and diastolic CHF (congestive heart failure) (  HCC) 11/12/2013   Congestive dilated cardiomyopathy (HCC) 11/04/2013   Past Medical History:  Diagnosis Date   Arthritis    CHF (congestive heart failure) (HCC)    Combined systolic and diastolic cardiac dysfunction    Echo 11/03/2013 EF 20%, grade 3 diastolic dysfunction   Hypertension    NICM (nonischemic cardiomyopathy) (HCC)    a. L/RHC (11/05/13): RA: 3, RV 52/5, PA 49/19 (31), PCWP 10, AO 166/93, PA 67%, Fick CO/CI: 5.71/2.97, Lmain: normal, LAD: large, without signficant dz, first diagonal has 20% dz at ostium, LCx: normal, RCA: 30% stenosis at the bifurcation of PDA and PLOM   Stroke [facility])     Family History  Problem Relation Age of Onset   Heart attack Father 63   Heart disease Father    Arrhythmia Father    Hypertension Brother    Hypertension Brother     Past Surgical History:  Procedure Laterality Date    BIOPSY  01/31/2019   Procedure: BIOPSY;  Surgeon: Kathi Der, MD;  [facility]: [facility] ENDOSCOPY;  Service: Gastroenterology;;   CATARACT EXTRACTION Bilateral 05/2021   ESOPHAGOGASTRODUODENOSCOPY N/A 01/31/2019   Procedure: ESOPHAGOGASTRODUODENOSCOPY (EGD);  Surgeon: Kathi Der, MD;  Location: [facility] ENDOSCOPY;  Service: Gastroenterology;  Laterality: N/A;   I & D EXTREMITY Left 05/05/2023   Procedure: IRRIGATION AND DEBRIDEMENT LEFT CALF;  Surgeon: Nadara Mustard, MD;  Location: [facility] OR;  Service: Orthopedics;  Laterality: Left;   I & D EXTREMITY Left 05/09/2023   Procedure: IRRIGATION AND DEBRIDEMENT OF LEG;  Surgeon: London Sheer, MD;  Location: [facility] OR;  Service: Orthopedics;  Laterality: Left;   IR FLUORO GUIDE CV LINE RIGHT  07/25/2018   IR US GUIDE VASC ACCESS RIGHT  07/25/2018   LEFT AND RIGHT HEART CATHETERIZATION WITH CORONARY ANGIOGRAM N/A 11/05/2013   Procedure: LEFT AND RIGHT HEART CATHETERIZATION WITH CORONARY ANGIOGRAM;  Surgeon: Peter M Swaziland, MD;  Location: [facility] CATH LAB;  Service: Cardiovascular;  Laterality: N/A;   RIGHT HEART CATHETERIZATION N/A 01/14/2014   Procedure: RIGHT HEART CATH;  Surgeon: Laurey Morale, MD;  Location: [facility] CATH LAB;  Service: Cardiovascular;  Laterality: N/A;   Social History   Occupational History   Occupation: Cabin crew  Tobacco Use   Smoking status: Never   Smokeless tobacco: Never  Substance and Sexual Activity   Alcohol use: Yes    Alcohol/week: 3.0 standard drinks of alcohol    Types: 3 Glasses of wine per week   Drug use: No   Sexual activity: Not Currently    Birth control/protection: None

## 2023-09-02 ENCOUNTER — Encounter: Payer: Self-pay | Admitting: Orthopedic Surgery

## 2023-09-02 ENCOUNTER — Ambulatory Visit: Admitting: Orthopedic Surgery

## 2023-09-02 DIAGNOSIS — S8012XD Contusion of left lower leg, subsequent encounter: Secondary | ICD-10-CM

## 2023-09-02 NOTE — Progress Notes (Signed)
 Office Visit Note   Patient: Jeremy Grant           Date of Birth: 12/29/1960           MRN: [id number] Visit Date: 09/02/2023              Requested by: Allene Ivan, MD [address] [place] [address] [place],  [place] [address] PCP: Allene Ivan, MD  Chief Complaint  Patient presents with   Left Leg - Follow-up    05/09/2023 I&D left calf      HPI: Patient is a 63 year old gentleman who is 4 months status post debridement and tissue grafting for a traumatic hematoma left calf.  Patient is pleased with the progress of the wound healing.  Currently using Vashe dressing changes.  Assessment & Plan: Visit Diagnoses:  1. Traumatic hematoma of left lower leg, subsequent encounter     Plan: Continue with Vashe dressing changes.  Reevaluate in 4 weeks.  Follow-Up Instructions: Return in about 4 weeks (around 09/30/2023).   Ortho Exam  Patient is alert, oriented, no adenopathy, well-dressed, normal affect, normal respiratory effort. Examination patient's wound is flat with healthy granulation tissue.  There is superficial epithelialization around the wound edges there is no cellulitis no odor no drainage.  Wound measures 13 x 9 cm.  Imaging: No results found.    Labs: Lab Results  Component Value Date   HGBA1C 6.1 (H) 05/04/2023   HGBA1C 5.4 01/12/2019   HGBA1C 6.0 (H) 11/02/2013     Lab Results  Component Value Date   ALBUMIN 4.6 05/28/2022   ALBUMIN 4.9 (H) 05/25/2021   ALBUMIN 4.7 04/21/2020    No results found for: "MG" No results found for: "VD25OH"  No results found for: "PREALBUMIN"    Latest Ref Rng & Units 05/10/2023    5:34 AM 05/09/2023    4:31 AM 05/08/2023    4:27 AM  CBC EXTENDED  WBC 4.0 - 10.5 K/uL 5.8  5.0  5.3   RBC 4.22 - 5.81 MIL/uL 2.69  2.87  2.94   Hemoglobin 13.0 - 17.0 g/dL 8.3  8.9  9.1   HCT 66.4 - 52.0 % 24.4  25.8  26.5   Platelets 150 - 400 K/uL 137  121  135      There is no height or weight on file to calculate  BMI.  Orders:  No orders of the defined types were placed in this encounter.  No orders of the defined types were placed in this encounter.    Procedures: No procedures performed  Clinical Data: No additional findings.  ROS:  All other systems negative, except as noted in the HPI. Review of Systems  Objective: Vital Signs: There were no vitals taken for this visit.  Specialty Comments:  No specialty comments available.  PMFS History: Patient Active Problem List   Diagnosis Date Noted   Hematoma 05/06/2023   Traumatic hematoma of left lower leg 05/04/2023   CKD (chronic kidney disease) stage 4, GFR 15-29 ml/min (HCC) 05/04/2023   Essential hypertension 05/25/2019   Spasm 03/23/2019   Steroid-induced hyperglycemia    Acute on chronic anemia    Seizures (HCC)    Spastic hemiparesis (HCC)    Vasculitis (HCC)    Hypertension    Thrombocytopenia (HCC)    Elevated serum protein level    Multiple myeloma (HCC)    Pancytopenia (HCC)    Normocytic normochromic anemia 07/16/2018   NICM (nonischemic cardiomyopathy) (HCC) 11/12/2013  History of ETOH abuse 11/12/2013   Chronic combined systolic and diastolic CHF (congestive heart failure) (HCC) 11/12/2013   Congestive dilated cardiomyopathy (HCC) 11/04/2013   Past Medical History:  Diagnosis Date   Arthritis    CHF (congestive heart failure) (HCC)    Combined systolic and diastolic cardiac dysfunction    Echo 11/03/2013 EF 20%, grade 3 diastolic dysfunction   Hypertension    NICM (nonischemic cardiomyopathy) (HCC)    a. L/RHC (11/05/13): RA: 3, RV 52/5, PA 49/19 (31), PCWP 10, AO 166/93, PA 67%, Fick CO/CI: 5.71/2.97, Lmain: normal, LAD: large, without signficant dz, first diagonal has 20% dz at ostium, LCx: normal, RCA: 30% stenosis at the bifurcation of PDA and PLOM   Stroke [facility])     Family History  Problem Relation Age of Onset   Heart attack Father 22   Heart disease Father    Arrhythmia Father    Hypertension  Brother    Hypertension Brother     Past Surgical History:  Procedure Laterality Date   BIOPSY  01/31/2019   Procedure: BIOPSY;  Surgeon: Felecia Hopper, MD;  [facility]: [facility] ENDOSCOPY;  Service: Gastroenterology;;   CATARACT EXTRACTION Bilateral 05/2021   ESOPHAGOGASTRODUODENOSCOPY N/A 01/31/2019   Procedure: ESOPHAGOGASTRODUODENOSCOPY (EGD);  Surgeon: Felecia Hopper, MD;  Location: [facility] ENDOSCOPY;  Service: Gastroenterology;  Laterality: N/A;   I & D EXTREMITY Left 05/05/2023   Procedure: IRRIGATION AND DEBRIDEMENT LEFT CALF;  Surgeon: Timothy Ford, MD;  Location: [facility] OR;  Service: Orthopedics;  Laterality: Left;   I & D EXTREMITY Left 05/09/2023   Procedure: IRRIGATION AND DEBRIDEMENT OF LEG;  Surgeon: Diedra Fowler, MD;  Location: [facility] OR;  Service: Orthopedics;  Laterality: Left;   IR FLUORO GUIDE CV LINE RIGHT  07/25/2018   IR US  GUIDE VASC ACCESS RIGHT  07/25/2018   LEFT AND RIGHT HEART CATHETERIZATION WITH CORONARY ANGIOGRAM N/A 11/05/2013   Procedure: LEFT AND RIGHT HEART CATHETERIZATION WITH CORONARY ANGIOGRAM;  Surgeon: Peter M Swaziland, MD;  Location[facility] CATH LAB;  Service: Cardiovascular;  Laterality: N/A;   RIGHT HEART CATHETERIZATION N/A 01/14/2014   Procedure: RIGHT HEART CATH;  Surgeon: Darlis Eisenmenger, MD;  Location: [facility] CATH LAB;  Service: Cardiovascular;  Laterality: N/A;   Social History   Occupational History   Occupation: Cabin crew  Tobacco Use   Smoking status: Never   Smokeless tobacco: Never  Substance and Sexual Activity   Alcohol use: Yes    Alcohol/week: 3.0 standard drinks of alcohol    Types: 3 Glasses of wine per week   Drug use: No   Sexual activity: Not Currently    Birth control/protection: None

## 2023-10-03 ENCOUNTER — Encounter: Payer: Self-pay | Admitting: Orthopedic Surgery

## 2023-10-03 ENCOUNTER — Ambulatory Visit: Admitting: Orthopedic Surgery

## 2023-10-03 DIAGNOSIS — S8012XD Contusion of left lower leg, subsequent encounter: Secondary | ICD-10-CM

## 2023-10-03 NOTE — Progress Notes (Signed)
 Office Visit Note   Patient: Jeremy Grant           Date of Birth: 1961/03/01           MRN: [id number] Visit Date: 10/03/2023              Requested by: Allene Ivan, MD [address] [place] [address] [place],  [place] [address] PCP: Allene Ivan, MD  Chief Complaint  Patient presents with   Left Leg - Follow-up      HPI: Patient is a 63 year old gentleman who presents 5 months status post irrigation debridement large hematoma left calf.  Patient is currently using Vashe for wound cleansing and compression.  Assessment & Plan: Visit Diagnoses:  1. Traumatic hematoma of left lower leg, subsequent encounter     Plan: Donated Kerecis applied.  Follow-Up Instructions: Return in about 1 week (around 10/10/2023).   Ortho Exam  Patient is alert, oriented, no adenopathy, well-dressed, normal affect, normal respiratory effort. Examination the wound bed has flat healthy granulation tissue.  The wound measures 7 x 13 cm.  There is no cellulitis there is still some swelling  Imaging: No results found.    Labs: Lab Results  Component Value Date   HGBA1C 6.1 (H) 05/04/2023   HGBA1C 5.4 01/12/2019   HGBA1C 6.0 (H) 11/02/2013     Lab Results  Component Value Date   ALBUMIN 4.6 05/28/2022   ALBUMIN 4.9 (H) 05/25/2021   ALBUMIN 4.7 04/21/2020    No results found for: "MG" No results found for: "VD25OH"  No results found for: "PREALBUMIN"    Latest Ref Rng & Units 05/10/2023    5:34 AM 05/09/2023    4:31 AM 05/08/2023    4:27 AM  CBC EXTENDED  WBC 4.0 - 10.5 K/uL 5.8  5.0  5.3   RBC 4.22 - 5.81 MIL/uL 2.69  2.87  2.94   Hemoglobin 13.0 - 17.0 g/dL 8.3  8.9  9.1   HCT 19.1 - 52.0 % 24.4  25.8  26.5   Platelets 150 - 400 K/uL 137  121  135      There is no height or weight on file to calculate BMI.  Orders:  No orders of the defined types were placed in this encounter.  No orders of the defined types were placed in this encounter.    Procedures: No  procedures performed  Clinical Data: No additional findings.  ROS:  All other systems negative, except as noted in the HPI. Review of Systems  Objective: Vital Signs: There were no vitals taken for this visit.  Specialty Comments:  No specialty comments available.  PMFS History: Patient Active Problem List   Diagnosis Date Noted   Hematoma 05/06/2023   Traumatic hematoma of left lower leg 05/04/2023   CKD (chronic kidney disease) stage 4, GFR 15-29 ml/min (HCC) 05/04/2023   Essential hypertension 05/25/2019   Spasm 03/23/2019   Steroid-induced hyperglycemia    Acute on chronic anemia    Seizures (HCC)    Spastic hemiparesis (HCC)    Vasculitis (HCC)    Hypertension    Thrombocytopenia (HCC)    Elevated serum protein level    Multiple myeloma (HCC)    Pancytopenia (HCC)    Normocytic normochromic anemia 07/16/2018   NICM (nonischemic cardiomyopathy) (HCC) 11/12/2013   History of ETOH abuse 11/12/2013   Chronic combined systolic and diastolic CHF (congestive heart failure) (HCC) 11/12/2013   Congestive dilated cardiomyopathy (HCC) 11/04/2013   Past  Medical History:  Diagnosis Date   Arthritis    CHF (congestive heart failure) (HCC)    Combined systolic and diastolic cardiac dysfunction    Echo 11/03/2013 EF 20%, grade 3 diastolic dysfunction   Hypertension    NICM (nonischemic cardiomyopathy) (HCC)    a. L/RHC (11/05/13): RA: 3, RV 52/5, PA 49/19 (31), PCWP 10, AO 166/93, PA 67%, Fick CO/CI: 5.71/2.97, Lmain: normal, LAD: large, without signficant dz, first diagonal has 20% dz at ostium, LCx: normal, RCA: 30% stenosis at the bifurcation of PDA and PLOM   Stroke [facility])     Family History  Problem Relation Age of Onset   Heart attack Father 51   Heart disease Father    Arrhythmia Father    Hypertension Brother    Hypertension Brother     Past Surgical History:  Procedure Laterality Date   BIOPSY  01/31/2019   Procedure: BIOPSY;  Surgeon: Felecia Hopper, MD;   Location: [facility] ENDOSCOPY;  Service: Gastroenterology;;   CATARACT EXTRACTION Bilateral 05/2021   ESOPHAGOGASTRODUODENOSCOPY N/A 01/31/2019   Procedure: ESOPHAGOGASTRODUODENOSCOPY (EGD);  Surgeon: Felecia Hopper, MD;  Location: [facility] ENDOSCOPY;  Service: Gastroenterology;  Laterality: N/A;   I & D EXTREMITY Left 05/05/2023   Procedure: IRRIGATION AND DEBRIDEMENT LEFT CALF;  Surgeon: Timothy Ford, MD;  Location: [facility] OR;  Service: Orthopedics;  Laterality: Left;   I & D EXTREMITY Left 05/09/2023   Procedure: IRRIGATION AND DEBRIDEMENT OF LEG;  Surgeon: Diedra Fowler, MD;  Location: [facility] OR;  Service: Orthopedics;  Laterality: Left;   IR FLUORO GUIDE CV LINE RIGHT  07/25/2018   IR US  GUIDE VASC ACCESS RIGHT  07/25/2018   LEFT AND RIGHT HEART CATHETERIZATION WITH CORONARY ANGIOGRAM N/A 11/05/2013   Procedure: LEFT AND RIGHT HEART CATHETERIZATION WITH CORONARY ANGIOGRAM;  Surgeon: Peter M Swaziland, MD;  Location[facility] CATH LAB;  Service: Cardiovascular;  Laterality: N/A;   RIGHT HEART CATHETERIZATION N/A 01/14/2014   Procedure: RIGHT HEART CATH;  Surgeon: Darlis Eisenmenger, MD;  Location: [facility] CATH LAB;  Service: Cardiovascular;  Laterality: N/A;   Social History   Occupational History   Occupation: Cabin crew  Tobacco Use   Smoking status: Never   Smokeless tobacco: Never  Substance and Sexual Activity   Alcohol use: Yes    Alcohol/week: 3.0 standard drinks of alcohol    Types: 3 Glasses of wine per week   Drug use: No   Sexual activity: Not Currently    Birth control/protection: None

## 2023-10-31 ENCOUNTER — Ambulatory Visit: Admitting: Orthopedic Surgery

## 2023-11-04 ENCOUNTER — Encounter: Payer: Self-pay | Admitting: Orthopedic Surgery

## 2023-11-04 ENCOUNTER — Ambulatory Visit: Admitting: Orthopedic Surgery

## 2023-11-04 DIAGNOSIS — S8012XD Contusion of left lower leg, subsequent encounter: Secondary | ICD-10-CM

## 2023-11-04 NOTE — Progress Notes (Signed)
 Office Visit Note   Patient: Jeremy Grant           Date of Birth: [birth year]           MRN: [id number] Visit Date: 11/04/2023              Requested by: Burney Darice CROME, MD [address] [place] [address] [place],  [place] [address] PCP: Burney Darice CROME, MD   Left Leg - Follow-up      HPI: Patient is a 63 year old gentleman who presents 6 months status post irrigation debridement large hematoma left calf. Patient is currently using Vashe for wound cleansing and compression. Slow to heal.  No other new complaints.  Assessment & Plan: Visit Diagnoses:  1. Traumatic hematoma of left lower leg, subsequent encounter     Plan: Continue with Vashe dressing changes. Reevaluate in 4 weeks.   Follow-Up Instructions: No follow-ups on file.   Ortho Exam  Patient is alert, oriented, no adenopathy, well-dressed, normal affect, normal respiratory effort. Wound with beefy red base.  Flat lateral leg wound 11 x 7.5 cm in size.  100 % granulation tissue.  No sign of infection or drainage.      Imaging: No results found.   Labs: Lab Results  Component Value Date   HGBA1C 6.1 (H) 05/04/2023   HGBA1C 5.4 01/12/2019   HGBA1C 6.0 (H) 11/02/2013     Lab Results  Component Value Date   ALBUMIN 4.6 05/28/2022   ALBUMIN 4.9 (H) 05/25/2021   ALBUMIN 4.7 04/21/2020    No results found for: MG No results found for: VD25OH  No results found for: PREALBUMIN    Latest Ref Rng & Units 05/10/2023    5:34 AM 05/09/2023    4:31 AM 05/08/2023    4:27 AM  CBC EXTENDED  WBC 4.0 - 10.5 K/uL 5.8  5.0  5.3   RBC 4.22 - 5.81 MIL/uL 2.69  2.87  2.94   Hemoglobin 13.0 - 17.0 g/dL 8.3  8.9  9.1   HCT 60.9 - 52.0 % 24.4  25.8  26.5   Platelets 150 - 400 K/uL 137  121  135      There is no height or weight on file to calculate BMI.  Orders:  No orders of the defined types were placed in this encounter.  No orders of the defined types were placed in this encounter.     Procedures: No procedures performed  Clinical Data: No additional findings.  ROS:  All other systems negative, except as noted in the HPI. Review of Systems  Objective: Vital Signs: There were no vitals taken for this visit.  Specialty Comments:  No specialty comments available.  PMFS History: Patient Active Problem List   Diagnosis Date Noted   Hematoma 05/06/2023   Traumatic hematoma of left lower leg 05/04/2023   CKD (chronic kidney disease) stage 4, GFR 15-29 ml/min (HCC) 05/04/2023   Essential hypertension 05/25/2019   Spasm 03/23/2019   Steroid-induced hyperglycemia    Acute on chronic anemia    Seizures (HCC)    Spastic hemiparesis (HCC)    Vasculitis (HCC)    Hypertension    Thrombocytopenia (HCC)    Elevated serum protein level    Multiple myeloma (HCC)    Pancytopenia (HCC)    Normocytic normochromic anemia 07/16/2018   NICM (nonischemic cardiomyopathy) (HCC) 11/12/2013   History of ETOH abuse 11/12/2013   Chronic combined systolic and diastolic CHF (congestive heart failure) (HCC) 11/12/2013  Congestive dilated cardiomyopathy (HCC) 11/04/2013   Past Medical History:  Diagnosis Date   Arthritis    CHF (congestive heart failure) (HCC)    Combined systolic and diastolic cardiac dysfunction    Echo 11/03/2013 EF 20%, grade 3 diastolic dysfunction   Hypertension    NICM (nonischemic cardiomyopathy) (HCC)    a. L/RHC (11/05/13): RA: 3, RV 52/5, PA 49/19 (31), PCWP 10, AO 166/93, PA 67%, Fick CO/CI: 5.71/2.97, Lmain: normal, LAD: large, without signficant dz, first diagonal has 20% dz at ostium, LCx: normal, RCA: 30% stenosis at the bifurcation of PDA and PLOM   Stroke [facility])     Family History  Problem Relation Age of Onset   Heart attack Father 24   Heart disease Father    Arrhythmia Father    Hypertension Brother    Hypertension Brother     Past Surgical History:  Procedure Laterality Date   BIOPSY  01/31/2019   Procedure: BIOPSY;  Surgeon:  Elicia Claw, MD;  Location: [facility] ENDOSCOPY;  Service: Gastroenterology;;   CATARACT EXTRACTION Bilateral 05/2021   ESOPHAGOGASTRODUODENOSCOPY N/A 01/31/2019   Procedure: ESOPHAGOGASTRODUODENOSCOPY (EGD);  Surgeon: Elicia Claw, MD;  Location: [facility] ENDOSCOPY;  Service: Gastroenterology;  Laterality: N/A;   I & D EXTREMITY Left 05/05/2023   Procedure: IRRIGATION AND DEBRIDEMENT LEFT CALF;  Surgeon: Harden Jerona GAILS, MD;  Location: [facility] OR;  Service: Orthopedics;  Laterality: Left;   I & D EXTREMITY Left 05/09/2023   Procedure: IRRIGATION AND DEBRIDEMENT OF LEG;  Surgeon: Georgina Ozell LABOR, MD;  Location: [facility] OR;  Service: Orthopedics;  Laterality: Left;   IR FLUORO GUIDE CV LINE RIGHT  07/25/2018   IR US  GUIDE VASC ACCESS RIGHT  07/25/2018   LEFT AND RIGHT HEART CATHETERIZATION WITH CORONARY ANGIOGRAM N/A 11/05/2013   Procedure: LEFT AND RIGHT HEART CATHETERIZATION WITH CORONARY ANGIOGRAM;  Surgeon: Peter M Swaziland, MD;  Location: [facility] CATH LAB;  Service: Cardiovascular;  Laterality: N/A;   RIGHT HEART CATHETERIZATION N/A 01/14/2014   Procedure: RIGHT HEART CATH;  Surgeon: Ezra GORMAN Shuck, MD;  Location: [facility] CATH LAB;  Service: Cardiovascular;  Laterality: N/A;   Social History   Occupational History   Occupation: Cabin crew  Tobacco Use   Smoking status: Never   Smokeless tobacco: Never  Substance and Sexual Activity   Alcohol use: Yes    Alcohol/week: 3.0 standard drinks of alcohol    Types: 3 Glasses of wine per week   Drug use: No   Sexual activity: Not Currently    Birth control/protection: None

## 2023-12-05 ENCOUNTER — Ambulatory Visit: Admitting: Physician Assistant

## 2023-12-05 DIAGNOSIS — S8012XD Contusion of left lower leg, subsequent encounter: Secondary | ICD-10-CM | POA: Diagnosis not present

## 2023-12-05 DIAGNOSIS — T148XXD Other injury of unspecified body region, subsequent encounter: Secondary | ICD-10-CM | POA: Diagnosis not present

## 2023-12-06 ENCOUNTER — Encounter: Payer: Self-pay | Admitting: Physician Assistant

## 2023-12-06 NOTE — Progress Notes (Signed)
 Office Visit Note   Patient: Jeremy [place]           Date of Birth: [birth year]           MRN: [id number] Visit Date: 12/05/2023              Requested by: Burney Darice CROME, MD [address] [place] [address] [place],  [place] [address] PCP: Burney Darice CROME, MD  Chief Complaint  Patient presents with   Left Leg - Follow-up      HPI: Patient is a 63 year old gentleman who presents 6 months status post irrigation debridement large hematoma left calf. Patient is currently using Vashe for wound cleansing and compression. Slow to heal.  No other new complaints.   Assessment & Plan: Visit Diagnoses: No diagnosis found.  Plan: Continue with Vashe dressing changes. Reevaluate in 4 weeks.   Follow-Up Instructions: Return in about 4 weeks (around 01/02/2024).   Ortho Exam  Patient is alert, oriented, no adenopathy, well-dressed, normal affect, normal respiratory effort. Wound with beefy red base. Flat lateral leg wound 11 x 7 cm in size. 100 % granulation tissue. No sign of infection or drainage. He has palpable pedal pulses DP/PT    Imaging: No results found.   Labs: Lab Results  Component Value Date   HGBA1C 6.1 (H) 05/04/2023   HGBA1C 5.4 01/12/2019   HGBA1C 6.0 (H) 11/02/2013     Lab Results  Component Value Date   ALBUMIN 4.6 05/28/2022   ALBUMIN 4.9 (H) 05/25/2021   ALBUMIN 4.7 04/21/2020    No results found for: MG No results found for: VD25OH  No results found for: PREALBUMIN    Latest Ref Rng & Units 05/10/2023    5:34 AM 05/09/2023    4:31 AM 05/08/2023    4:27 AM  CBC EXTENDED  WBC 4.0 - 10.5 K/uL 5.8  5.0  5.3   RBC 4.22 - 5.81 MIL/uL 2.69  2.87  2.94   Hemoglobin 13.0 - 17.0 g/dL 8.3  8.9  9.1   HCT 60.9 - 52.0 % 24.4  25.8  26.5   Platelets 150 - 400 K/uL 137  121  135      There is no height or weight on file to calculate BMI.  Orders:  No orders of the defined types were placed in this encounter.  No orders of the defined types  were placed in this encounter.    Procedures: No procedures performed  Clinical Data: No additional findings.  ROS:  All other systems negative, except as noted in the HPI. Review of Systems  Objective: Vital Signs: There were no vitals taken for this visit.  Specialty Comments:  No specialty comments available.  PMFS History: Patient Active Problem List   Diagnosis Date Noted   Hematoma 05/06/2023   Traumatic hematoma of left lower leg 05/04/2023   CKD (chronic kidney disease) stage 4, GFR 15-29 ml/min (HCC) 05/04/2023   Essential hypertension 05/25/2019   Spasm 03/23/2019   Steroid-induced hyperglycemia    Acute on chronic anemia    Seizures (HCC)    Spastic hemiparesis (HCC)    Vasculitis (HCC)    Hypertension    Thrombocytopenia (HCC)    Elevated serum protein level    Multiple myeloma (HCC)    Pancytopenia (HCC)    Normocytic normochromic anemia 07/16/2018   NICM (nonischemic cardiomyopathy) (HCC) 11/12/2013   History of ETOH abuse 11/12/2013   Chronic combined systolic and diastolic CHF (congestive heart failure) (HCC) 11/12/2013  Congestive dilated cardiomyopathy (HCC) 11/04/2013   Past Medical History:  Diagnosis Date   Arthritis    CHF (congestive heart failure) (HCC)    Combined systolic and diastolic cardiac dysfunction    Echo 11/03/2013 EF 20%, grade 3 diastolic dysfunction   Hypertension    NICM (nonischemic cardiomyopathy) (HCC)    a. L/RHC (11/05/13): RA: 3, RV 52/5, PA 49/19 (31), PCWP 10, AO 166/93, PA 67%, Fick CO/CI: 5.71/2.97, Lmain: normal, LAD: large, without signficant dz, first diagonal has 20% dz at ostium, LCx: normal, RCA: 30% stenosis at the bifurcation of PDA and PLOM   Stroke [facility])     Family History  Problem Relation Age of Onset   Heart attack Father 43   Heart disease Father    Arrhythmia Father    Hypertension Brother    Hypertension Brother     Past Surgical History:  Procedure Laterality Date   BIOPSY  01/31/2019    Procedure: BIOPSY;  Surgeon: Elicia Claw, MD;  Location: [facility] ENDOSCOPY;  Service: Gastroenterology;;   CATARACT EXTRACTION Bilateral 05/2021   ESOPHAGOGASTRODUODENOSCOPY N/A 01/31/2019   Procedure: ESOPHAGOGASTRODUODENOSCOPY (EGD);  Surgeon: Elicia Claw, MD;  Location[facility] ENDOSCOPY;  Service: Gastroenterology;  Laterality: N/A;   I & D EXTREMITY Left 05/05/2023   Procedure: IRRIGATION AND DEBRIDEMENT LEFT CALF;  Surgeon: Harden Jerona GAILS, MD;  Location: [facility] OR;  Service: Orthopedics;  Laterality: Left;   I & D EXTREMITY Left 05/09/2023   Procedure: IRRIGATION AND DEBRIDEMENT OF LEG;  Surgeon: Georgina Ozell LABOR, MD;  Location: [facility] OR;  Service: Orthopedics;  Laterality: Left;   IR FLUORO GUIDE CV LINE RIGHT  07/25/2018   IR US  GUIDE VASC ACCESS RIGHT  07/25/2018   LEFT AND RIGHT HEART CATHETERIZATION WITH CORONARY ANGIOGRAM N/A 11/05/2013   Procedure: LEFT AND RIGHT HEART CATHETERIZATION WITH CORONARY ANGIOGRAM;  Surgeon: Peter M Swaziland, MD;  Location: [facility] CATH LAB;  Service: Cardiovascular;  Laterality: N/A;   RIGHT HEART CATHETERIZATION N/A 01/14/2014   Procedure: RIGHT HEART CATH;  Surgeon: Ezra GORMAN Shuck, MD;  Location: [facility] CATH LAB;  Service: Cardiovascular;  Laterality: N/A;   Social History   Occupational History   Occupation: Cabin crew  Tobacco Use   Smoking status: Never   Smokeless tobacco: Never  Substance and Sexual Activity   Alcohol use: Yes    Alcohol/week: 3.0 standard drinks of alcohol    Types: 3 Glasses of wine per week   Drug use: No   Sexual activity: Not Currently    Birth control/protection: None

## 2024-01-06 ENCOUNTER — Ambulatory Visit (INDEPENDENT_AMBULATORY_CARE_PROVIDER_SITE_OTHER): Admitting: Orthopedic Surgery

## 2024-01-06 ENCOUNTER — Encounter: Payer: Self-pay | Admitting: Orthopedic Surgery

## 2024-01-06 DIAGNOSIS — S8012XD Contusion of left lower leg, subsequent encounter: Secondary | ICD-10-CM

## 2024-01-06 DIAGNOSIS — T148XXD Other injury of unspecified body region, subsequent encounter: Secondary | ICD-10-CM | POA: Diagnosis not present

## 2024-01-06 NOTE — Progress Notes (Signed)
 Office Visit Note   Patient: Jeremy Grant           Date of Birth: 06/17/1960           MRN: [id number] Visit Date: 01/06/2024              Requested by: Burney Darice CROME, MD [address] [place] [address] [place],  [place] [address] PCP: Burney Darice CROME, MD  Chief Complaint  Patient presents with   Left Leg - Follow-up      HPI: Patient is seen in follow-up for hematoma left leg.  Patient is 8 months out from surgery.  Currently doing Vashe dressing changes daily.  Assessment & Plan: Visit Diagnoses: No diagnosis found.  Plan: Continue current wound care.  Follow-Up Instructions: No follow-ups on file.   Ortho Exam  Patient is alert, oriented, no adenopathy, well-dressed, normal affect, normal respiratory effort. Examination the wound bed has flat granulation tissue.  Measuring the tibial crest length this is 10  cm in length and 7 cm wide.  There is no cellulitis there is good epithelization around the wound edges.    Imaging: No results found.   Labs: Lab Results  Component Value Date   HGBA1C 6.1 (H) 05/04/2023   HGBA1C 5.4 01/12/2019   HGBA1C 6.0 (H) 11/02/2013     Lab Results  Component Value Date   ALBUMIN 4.6 05/28/2022   ALBUMIN 4.9 (H) 05/25/2021   ALBUMIN 4.7 04/21/2020    No results found for: MG No results found for: VD25OH  No results found for: PREALBUMIN    Latest Ref Rng & Units 05/10/2023    5:34 AM 05/09/2023    4:31 AM 05/08/2023    4:27 AM  CBC EXTENDED  WBC 4.0 - 10.5 K/uL 5.8  5.0  5.3   RBC 4.22 - 5.81 MIL/uL 2.69  2.87  2.94   Hemoglobin 13.0 - 17.0 g/dL 8.3  8.9  9.1   HCT 60.9 - 52.0 % 24.4  25.8  26.5   Platelets 150 - 400 K/uL 137  121  135      There is no height or weight on file to calculate BMI.  Orders:  No orders of the defined types were placed in this encounter.  No orders of the defined types were placed in this encounter.    Procedures: No procedures performed  Clinical Data: No  additional findings.  ROS:  All other systems negative, except as noted in the HPI. Review of Systems  Objective: Vital Signs: There were no vitals taken for this visit.  Specialty Comments:  No specialty comments available.  PMFS History: Patient Active Problem List   Diagnosis Date Noted   Hematoma 05/06/2023   Traumatic hematoma of left lower leg 05/04/2023   CKD (chronic kidney disease) stage 4, GFR 15-29 ml/min (HCC) 05/04/2023   Essential hypertension 05/25/2019   Spasm 03/23/2019   Steroid-induced hyperglycemia    Acute on chronic anemia    Seizures (HCC)    Spastic hemiparesis (HCC)    Vasculitis (HCC)    Hypertension    Thrombocytopenia (HCC)    Elevated serum protein level    Multiple myeloma (HCC)    Pancytopenia (HCC)    Normocytic normochromic anemia 07/16/2018   NICM (nonischemic cardiomyopathy) (HCC) 11/12/2013   History of ETOH abuse 11/12/2013   Chronic combined systolic and diastolic CHF (congestive heart failure) (HCC) 11/12/2013   Congestive dilated cardiomyopathy (HCC) 11/04/2013   Past Medical History:  Diagnosis  Date   Arthritis    CHF (congestive heart failure) (HCC)    Combined systolic and diastolic cardiac dysfunction    Echo 11/03/2013 EF 20%, grade 3 diastolic dysfunction   Hypertension    NICM (nonischemic cardiomyopathy) (HCC)    a. L/RHC (11/05/13): RA: 3, RV 52/5, PA 49/19 (31), PCWP 10, AO 166/93, PA 67%, Fick CO/CI: 5.71/2.97, Lmain: normal, LAD: large, without signficant dz, first diagonal has 20% dz at ostium, LCx: normal, RCA: 30% stenosis at the bifurcation of PDA and PLOM   Stroke [facility])     Family History  Problem Relation Age of Onset   Heart attack Father 46   Heart disease Father    Arrhythmia Father    Hypertension Brother    Hypertension Brother     Past Surgical History:  Procedure Laterality Date   BIOPSY  01/31/2019   Procedure: BIOPSY;  Surgeon: Elicia Claw, MD;  Location: [facility] ENDOSCOPY;  Service:  Gastroenterology;;   CATARACT EXTRACTION Bilateral 05/2021   ESOPHAGOGASTRODUODENOSCOPY N/A 01/31/2019   Procedure: ESOPHAGOGASTRODUODENOSCOPY (EGD);  Surgeon: Elicia Claw, MD;  Location: [facility] ENDOSCOPY;  Service: Gastroenterology;  Laterality: N/A;   I & D EXTREMITY Left 05/05/2023   Procedure: IRRIGATION AND DEBRIDEMENT LEFT CALF;  Surgeon: Harden Jerona GAILS, MD;  Location: [facility] OR;  Service: Orthopedics;  Laterality: Left;   I & D EXTREMITY Left 05/09/2023   Procedure: IRRIGATION AND DEBRIDEMENT OF LEG;  Surgeon: Georgina Ozell LABOR, MD;  Location: [facility] OR;  Service: Orthopedics;  Laterality: Left;   IR FLUORO GUIDE CV LINE RIGHT  07/25/2018   IR US  GUIDE VASC ACCESS RIGHT  07/25/2018   LEFT AND RIGHT HEART CATHETERIZATION WITH CORONARY ANGIOGRAM N/A 11/05/2013   Procedure: LEFT AND RIGHT HEART CATHETERIZATION WITH CORONARY ANGIOGRAM;  Surgeon: Peter M Swaziland, MD;  Location: [facility] CATH LAB;  Service: Cardiovascular;  Laterality: N/A;   RIGHT HEART CATHETERIZATION N/A 01/14/2014   Procedure: RIGHT HEART CATH;  Surgeon: Ezra GORMAN Shuck, MD;  Location: [facility] CATH LAB;  Service: Cardiovascular;  Laterality: N/A;   Social History   Occupational History   Occupation: Cabin crew  Tobacco Use   Smoking status: Never   Smokeless tobacco: Never  Substance and Sexual Activity   Alcohol use: Yes    Alcohol/week: 3.0 standard drinks of alcohol    Types: 3 Glasses of wine per week   Drug use: No   Sexual activity: Not Currently    Birth control/protection: None

## 2024-02-06 ENCOUNTER — Ambulatory Visit: Admitting: Orthopedic Surgery

## 2024-02-10 ENCOUNTER — Ambulatory Visit (INDEPENDENT_AMBULATORY_CARE_PROVIDER_SITE_OTHER): Admitting: Orthopedic Surgery

## 2024-02-10 DIAGNOSIS — T148XXD Other injury of unspecified body region, subsequent encounter: Secondary | ICD-10-CM | POA: Diagnosis not present

## 2024-02-10 DIAGNOSIS — S8012XD Contusion of left lower leg, subsequent encounter: Secondary | ICD-10-CM | POA: Diagnosis not present

## 2024-02-11 ENCOUNTER — Encounter: Payer: Self-pay | Admitting: Orthopedic Surgery

## 2024-02-11 NOTE — Progress Notes (Addendum)
 Office Visit Note   Patient: Jeremy Grant           Date of Birth: [birth year]           MRN: [id number] Visit Date: 02/10/2024              Requested by: Burney Darice CROME, MD [address] [place] [address] [place],  [place] [address] PCP: Burney Darice CROME, MD  Chief Complaint  Patient presents with   Left Leg - Follow-up    05/09/2023 I&D LLE      HPI: Discussed the use of AI scribe software for clinical note transcription with the patient, who gave verbal consent to proceed.  History of Present Illness Jeremy Grant is a 63 year old male who presents for wound care management. He was referred by his kidney doctor for the use of Manuka honey in wound care.  He is currently managing a chronic wound with alternating applications of Vosh and Manuka honey. He uses a high-quality Manuka honey and alternates between the two products every few days. He has been using Vosh for the last three days.  His dressing protocol involves using adaptive gauze, dampening the outer gauze with Vosh, and changing it daily.     Assessment & Plan: Visit Diagnoses:  1. Traumatic hematoma of left lower leg, subsequent encounter   2. Delayed wound healing     Plan: Assessment and Plan Assessment & Plan Chronic non-pressure ulcer of lower limb Chronic ulcer with healthy granulation tissue, no drainage or cellulitis. - Apply Kerasys graft today. - Use adaptive gauze with Vosh, dampen outer gauze with Vosh, change daily with Ace wrap. - Leave dressing for seven days, then switch to Manuka honey or Vosh. - Follow up in two weeks.      Follow-Up Instructions: Return in about 2 weeks (around 02/24/2024).   Ortho Exam  Patient is alert, oriented, no adenopathy, well-dressed, normal affect, normal respiratory effort. Physical Exam SKIN: Wound with flat, healthy granulation tissue, no drainage, no cellulitis.   The wound is 10 x 6 cm with improved epithelialization.   Imaging: No  results found.   Labs: Lab Results  Component Value Date   HGBA1C 6.1 (H) 05/04/2023   HGBA1C 5.4 01/12/2019   HGBA1C 6.0 (H) 11/02/2013     Lab Results  Component Value Date   ALBUMIN 4.6 05/28/2022   ALBUMIN 4.9 (H) 05/25/2021   ALBUMIN 4.7 04/21/2020    No results found for: MG No results found for: VD25OH  No results found for: PREALBUMIN    Latest Ref Rng & Units 05/10/2023    5:34 AM 05/09/2023    4:31 AM 05/08/2023    4:27 AM  CBC EXTENDED  WBC 4.0 - 10.5 K/uL 5.8  5.0  5.3   RBC 4.22 - 5.81 MIL/uL 2.69  2.87  2.94   Hemoglobin 13.0 - 17.0 g/dL 8.3  8.9  9.1   HCT 60.9 - 52.0 % 24.4  25.8  26.5   Platelets 150 - 400 K/uL 137  121  135      There is no height or weight on file to calculate BMI.  Orders:  No orders of the defined types were placed in this encounter.  No orders of the defined types were placed in this encounter.    Procedures: No procedures performed  Clinical Data: No additional findings.  ROS:  All other systems negative, except as noted in the HPI. Review of Systems  Objective: Vital Signs: There were no vitals taken for this visit.  Specialty Comments:  No specialty comments available.  PMFS History: Patient Active Problem List   Diagnosis Date Noted   Hematoma 05/06/2023   Traumatic hematoma of left lower leg 05/04/2023   CKD (chronic kidney disease) stage 4, GFR 15-29 ml/min (HCC) 05/04/2023   Essential hypertension 05/25/2019   Spasm 03/23/2019   Steroid-induced hyperglycemia    Acute on chronic anemia    Seizures (HCC)    Spastic hemiparesis (HCC)    Vasculitis    Hypertension    Thrombocytopenia    Elevated serum protein level    Multiple myeloma (HCC)    Pancytopenia (HCC)    Normocytic normochromic anemia 07/16/2018   NICM (nonischemic cardiomyopathy) (HCC) 11/12/2013   History of ETOH abuse 11/12/2013   Chronic combined systolic and diastolic CHF (congestive heart failure) (HCC) 11/12/2013    Congestive dilated cardiomyopathy (HCC) 11/04/2013   Past Medical History:  Diagnosis Date   Arthritis    CHF (congestive heart failure) (HCC)    Combined systolic and diastolic cardiac dysfunction    Echo 11/03/2013 EF 20%, grade 3 diastolic dysfunction   Hypertension    NICM (nonischemic cardiomyopathy) (HCC)    a. L/RHC (11/05/13): RA: 3, RV 52/5, PA 49/19 (31), PCWP 10, AO 166/93, PA 67%, Fick CO/CI: 5.71/2.97, Lmain: normal, LAD: large, without signficant dz, first diagonal has 20% dz at ostium, LCx: normal, RCA: 30% stenosis at the bifurcation of PDA and PLOM   Stroke [facility])     Family History  Problem Relation Age of Onset   Heart attack Father 40   Heart disease Father    Arrhythmia Father    Hypertension Brother    Hypertension Brother     Past Surgical History:  Procedure Laterality Date   BIOPSY  01/31/2019   Procedure: BIOPSY;  Surgeon: Elicia Claw, MD;  Location: [facility] ENDOSCOPY;  Service: Gastroenterology;;   CATARACT EXTRACTION Bilateral 05/2021   ESOPHAGOGASTRODUODENOSCOPY N/A 01/31/2019   Procedure: ESOPHAGOGASTRODUODENOSCOPY (EGD);  Surgeon: Elicia Claw, MD;  Location: [facility] ENDOSCOPY;  Service: Gastroenterology;  Laterality: N/A;   I & D EXTREMITY Left 05/05/2023   Procedure: IRRIGATION AND DEBRIDEMENT LEFT CALF;  Surgeon: Harden Jerona GAILS, MD;  Location[facility] OR;  Service: Orthopedics;  Laterality: Left;   I & D EXTREMITY Left 05/09/2023   Procedure: IRRIGATION AND DEBRIDEMENT OF LEG;  Surgeon: Georgina Ozell LABOR, MD;  Location: [facility] OR;  Service: Orthopedics;  Laterality: Left;   IR FLUORO GUIDE CV LINE RIGHT  07/25/2018   IR US  GUIDE VASC ACCESS RIGHT  07/25/2018   LEFT AND RIGHT HEART CATHETERIZATION WITH CORONARY ANGIOGRAM N/A 11/05/2013   Procedure: LEFT AND RIGHT HEART CATHETERIZATION WITH CORONARY ANGIOGRAM;  Surgeon: Peter M Swaziland, MD;  Location: [facility] CATH LAB;  Service: Cardiovascular;  Laterality: N/A;   RIGHT HEART CATHETERIZATION N/A 01/14/2014    Procedure: RIGHT HEART CATH;  Surgeon: Ezra GORMAN Shuck, MD;  Location: [facility] C[facility] LAB;  Service: Cardiovascular;  Laterality: N/A;   Social History   Occupational History   Occupation: Cabin crew  Tobacco Use   Smoking status: Never   Smokeless tobacco: Never  Substance and Sexual Activity   Alcohol use: Yes    Alcohol/week: 3.0 standard drinks of alcohol    Types: 3 Glasses of wine per week   Drug use: No   Sexual activity: Not Currently    Birth control/protection: None

## 2024-02-24 ENCOUNTER — Ambulatory Visit (INDEPENDENT_AMBULATORY_CARE_PROVIDER_SITE_OTHER): Admitting: Orthopedic Surgery

## 2024-02-24 DIAGNOSIS — S8012XD Contusion of left lower leg, subsequent encounter: Secondary | ICD-10-CM

## 2024-02-24 DIAGNOSIS — T148XXD Other injury of unspecified body region, subsequent encounter: Secondary | ICD-10-CM

## 2024-02-25 ENCOUNTER — Encounter: Payer: Self-pay | Admitting: Orthopedic Surgery

## 2024-02-25 NOTE — Progress Notes (Signed)
 Office Visit Note   Patient: Jeremy Grant           Date of Birth: Apr 24, 1961           MRN: [id number] Visit Date: 02/24/2024              Requested by: Burney Darice CROME, MD [address] [place] [address] [place],  [place] [address] PCP: Burney Darice CROME, MD  Chief Complaint  Patient presents with   Left Leg - Follow-up      HPI: Discussed the use of AI scribe software for clinical note transcription with the patient, who gave verbal consent to proceed.  History of Present Illness Jeremy Grant is a 63 year old male with autoimmune disease and kidney disease who presents for follow-up of a foot wound post tissue graft.  He is one week status post epidermal keratosis micrograft on his foot. The wound has shown improvement compared to previous days, initially presenting as red with fleshy areas. He notes increased bleeding, which he perceives as a positive sign, and observes 'baby skin' around the wound edges.  He continues to use Edrick and Manuka honey as part of his wound care regimen. He mentions that the healing process has been slow, possibly due to his underlying autoimmune disease and kidney disease.  No issues with the pulse in his foot. He wants the healing process to be quicker, noting that it will be a year by Christmas since the onset of the issue.     Assessment & Plan: Visit Diagnoses:  1. Traumatic hematoma of left lower leg, subsequent encounter   2. Delayed wound healing     Plan: Assessment and Plan Assessment & Plan Non-healing lower extremity wound, status post tissue graft One week post ebrema keratosis micro graft. Wound measures 15x7 cm with healthy granulation tissue and petechial bleeding, indicating positive healing. Epithelialization present at edges. Dorsalis pedis pulse palpable. Autoimmune and kidney diseases may delay healing. - Continue wound wrapping as instructed. - Continue using Edrick and Manuka honey for wound care. - Schedule  follow-up appointment in four weeks.      Follow-Up Instructions: Return in about 4 weeks (around 03/23/2024).   Ortho Exam  Patient is alert, oriented, no adenopathy, well-dressed, normal affect, normal respiratory effort. Physical Exam CARDIOVASCULAR: Palpable dorsalis pedis pulse. SKIN: Wound flat with healthy granulation tissue and good petechial bleeding. Epithelialization around wound edges.  Wound measures 15 x 7 cm with flat healthy granulation tissue.      Imaging: No results found.   Labs: Lab Results  Component Value Date   HGBA1C 6.1 (H) 05/04/2023   HGBA1C 5.4 01/12/2019   HGBA1C 6.0 (H) 11/02/2013     Lab Results  Component Value Date   ALBUMIN 4.6 05/28/2022   ALBUMIN 4.9 (H) 05/25/2021   ALBUMIN 4.7 04/21/2020    No results found for: MG No results found for: VD25OH  No results found for: PREALBUMIN    Latest Ref Rng & Units 05/10/2023    5:34 AM 05/09/2023    4:31 AM 05/08/2023    4:27 AM  CBC EXTENDED  WBC 4.0 - 10.5 K/uL 5.8  5.0  5.3   RBC 4.22 - 5.81 MIL/uL 2.69  2.87  2.94   Hemoglobin 13.0 - 17.0 g/dL 8.3  8.9  9.1   HCT 60.9 - 52.0 % 24.4  25.8  26.5   Platelets 150 - 400 K/uL 137  121  135  There is no height or weight on file to calculate BMI.  Orders:  No orders of the defined types were placed in this encounter.  No orders of the defined types were placed in this encounter.    Procedures: No procedures performed  Clinical Data: No additional findings.  ROS:  All other systems negative, except as noted in the HPI. Review of Systems  Objective: Vital Signs: There were no vitals taken for this visit.  Specialty Comments:  No specialty comments available.  PMFS History: Patient Active Problem List   Diagnosis Date Noted   Hematoma 05/06/2023   Traumatic hematoma of left lower leg 05/04/2023   CKD (chronic kidney disease) stage 4, GFR 15-29 ml/min (HCC) 05/04/2023   Essential hypertension 05/25/2019    Spasm 03/23/2019   Steroid-induced hyperglycemia    Acute on chronic anemia    Seizures (HCC)    Spastic hemiparesis (HCC)    Vasculitis    Hypertension    Thrombocytopenia    Elevated serum protein level    Multiple myeloma (HCC)    Pancytopenia (HCC)    Normocytic normochromic anemia 07/16/2018   NICM (nonischemic cardiomyopathy) (HCC) 11/12/2013   History of ETOH abuse 11/12/2013   Chronic combined systolic and diastolic CHF (congestive heart failure) (HCC) 11/12/2013   Congestive dilated cardiomyopathy (HCC) 11/04/2013   Past Medical History:  Diagnosis Date   Arthritis    CHF (congestive heart failure) (HCC)    Combined systolic and diastolic cardiac dysfunction    Echo 11/03/2013 EF 20%, grade 3 diastolic dysfunction   Hypertension    NICM (nonischemic cardiomyopathy) (HCC)    a. L/RHC (11/05/13): RA: 3, RV 52/5, PA 49/19 (31), PCWP 10, AO 166/93, PA 67%, Fick CO/CI: 5.71/2.97, Lmain: normal, LAD: large, without signficant dz, first diagonal has 20% dz at ostium, LCx: normal, RCA: 30% stenosis at the bifurcation of PDA and PLOM   Stroke [facility])     Family History  Problem Relation Age of Onset   Heart attack Father 57   Heart disease Father    Arrhythmia Father    Hypertension Brother    Hypertension Brother     Past Surgical History:  Procedure Laterality Date   BIOPSY  01/31/2019   Procedure: BIOPSY;  Surgeon: Elicia Claw, MD;  Location: [facility] ENDOSCOPY;  Service: Gastroenterology;;   CATARACT EXTRACTION Bilateral 05/2021   ESOPHAGOGASTRODUODENOSCOPY N/A 01/31/2019   Procedure: ESOPHAGOGASTRODUODENOSCOPY (EGD);  Surgeon: Elicia Claw, MD;  Location: [facility] ENDOSCOPY;  Service: Gastroenterology;  Laterality: N/A;   I & D EXTREMITY Left 05/05/2023   Procedure: IRRIGATION AND DEBRIDEMENT LEFT CALF;  Surgeon: Harden Jerona GAILS, MD;  Location[facility] OR;  Service: Orthopedics;  Laterality: Left;   I & D EXTREMITY Left 05/09/2023   Procedure: IRRIGATION AND DEBRIDEMENT OF  LEG;  Surgeon: Georgina Ozell LABOR, MD;  Location: [facility] OR;  Service: Orthopedics;  Laterality: Left;   IR FLUORO GUIDE CV LINE RIGHT  07/25/2018   IR US  GUIDE VASC ACCESS RIGHT  07/25/2018   LEFT AND RIGHT HEART CATHETERIZATION WITH CORONARY ANGIOGRAM N/A 11/05/2013   Procedure: LEFT AND RIGHT HEART CATHETERIZATION WITH CORONARY ANGIOGRAM;  Surgeon: Peter M Swaziland, MD;  Location: [facility] CATH LAB;  Service: Cardiovascular;  Laterality: N/A;   RIGHT HEART CATHETERIZATION N/A 01/14/2014   Procedure: RIGHT HEART CATH;  Surgeon: Ezra GORMAN Shuck, MD;  Location: [facility] CATH LAB;  Service: Cardiovascular;  Laterality: N/A;   Social History   Occupational History   Occupation: Cabin crew  Tobacco Use  Smoking status: Never   Smokeless tobacco: Never  Substance and Sexual Activity   Alcohol use: Yes    Alcohol/week: 3.0 standard drinks of alcohol    Types: 3 Glasses of wine per week   Drug use: No   Sexual activity: Not Currently    Birth control/protection: None

## 2024-03-16 ENCOUNTER — Encounter: Payer: Self-pay | Admitting: Radiology

## 2024-03-26 ENCOUNTER — Ambulatory Visit: Admitting: Orthopedic Surgery

## 2024-03-26 DIAGNOSIS — T148XXD Other injury of unspecified body region, subsequent encounter: Secondary | ICD-10-CM

## 2024-03-26 DIAGNOSIS — S8012XD Contusion of left lower leg, subsequent encounter: Secondary | ICD-10-CM | POA: Diagnosis not present

## 2024-03-30 ENCOUNTER — Encounter: Payer: Self-pay | Admitting: Orthopedic Surgery

## 2024-03-30 NOTE — Progress Notes (Signed)
 Office Visit Note   Patient: Jeremy Grant           Date of Birth: May 09, 1961           MRN: [id number] Visit Date: 03/26/2024              Requested by: Burney Darice CROME, MD [address] [place] [address] [place],  [place] [address] PCP: Burney Darice CROME, MD  Chief Complaint  Patient presents with   Left Leg - Wound Check      HPI: Discussed the use of AI scribe software for clinical note transcription with the patient, who gave verbal consent to proceed.  History of Present Illness Jeremy Grant is a 63 year old male who presents with a chronic non-healing wound.  He has been dealing with a chronic wound for nearly a year, with the largest dimension currently measuring thirteen by six centimeters. Despite ongoing treatment, the healing process has been slow.  He has been using Vosh for cleaning the wound and applies compression using 'the little square things in the wrap' daily.     Assessment & Plan: Visit Diagnoses: No diagnosis found.  Plan: Assessment and Plan Assessment & Plan Chronic non-healing skin ulcer Ulcer 13x6 cm with healthy granulation tissue but stalled healing. - Continue wound care with Vosh and compression wraps. - Check insurance for platelet derived growth factor or amnion chorion product authorization. - Follow-up in four weeks.      Follow-Up Instructions: No follow-ups on file.   Ortho Exam  Patient is alert, oriented, no adenopathy, well-dressed, normal affect, normal respiratory effort. Physical Exam SKIN: Wound with flat, healthy granulation tissue, healing stalled, measures 13x6 cm.      Imaging: No results found.   Labs: Lab Results  Component Value Date   HGBA1C 6.1 (H) 05/04/2023   HGBA1C 5.4 01/12/2019   HGBA1C 6.0 (H) 11/02/2013     Lab Results  Component Value Date   ALBUMIN 4.6 05/28/2022   ALBUMIN 4.9 (H) 05/25/2021   ALBUMIN 4.7 04/21/2020    No results found for: MG No results found for:  VD25OH  No results found for: PREALBUMIN    Latest Ref Rng & Units 05/10/2023    5:34 AM 05/09/2023    4:31 AM 05/08/2023    4:27 AM  CBC EXTENDED  WBC 4.0 - 10.5 K/uL 5.8  5.0  5.3   RBC 4.22 - 5.81 MIL/uL 2.69  2.87  2.94   Hemoglobin 13.0 - 17.0 g/dL 8.3  8.9  9.1   HCT 60.9 - 52.0 % 24.4  25.8  26.5   Platelets 150 - 400 K/uL 137  121  135      There is no height or weight on file to calculate BMI.  Orders:  No orders of the defined types were placed in this encounter.  No orders of the defined types were placed in this encounter.    Procedures: No procedures performed  Clinical Data: No additional findings.  ROS:  All other systems negative, except as noted in the HPI. Review of Systems  Objective: Vital Signs: There were no vitals taken for this visit.  Specialty Comments:  No specialty comments available.  PMFS History: Patient Active Problem List   Diagnosis Date Noted   Hematoma 05/06/2023   Traumatic hematoma of left lower leg 05/04/2023   CKD (chronic kidney disease) stage 4, GFR 15-29 ml/min (HCC) 05/04/2023   Essential hypertension 05/25/2019   Spasm 03/23/2019  Steroid-induced hyperglycemia    Acute on chronic anemia    Seizures (HCC)    Spastic hemiparesis (HCC)    Vasculitis    Hypertension    Thrombocytopenia    Elevated serum protein level    Multiple myeloma (HCC)    Pancytopenia (HCC)    Normocytic normochromic anemia 07/16/2018   NICM (nonischemic cardiomyopathy) (HCC) 11/12/2013   History of ETOH abuse 11/12/2013   Chronic combined systolic and diastolic CHF (congestive heart failure) (HCC) 11/12/2013   Congestive dilated cardiomyopathy (HCC) 11/04/2013   Past Medical History:  Diagnosis Date   Arthritis    CHF (congestive heart failure) (HCC)    Combined systolic and diastolic cardiac dysfunction    Echo 11/03/2013 EF 20%, grade 3 diastolic dysfunction   Hypertension    NICM (nonischemic cardiomyopathy) (HCC)    a.  L/RHC (11/05/13): RA: 3, RV 52/5, PA 49/19 (31), PCWP 10, AO 166/93, PA 67%, Fick CO/CI: 5.71/2.97, Lmain: normal, LAD: large, without signficant dz, first diagonal has 20% dz at ostium, LCx: normal, RCA: 30% stenosis at the bifurcation of PDA and PLOM   Stroke [facility])     Family History  Problem Relation Age of Onset   Heart attack Father 65   Heart disease Father    Arrhythmia Father    Hypertension Brother    Hypertension Brother     Past Surgical History:  Procedure Laterality Date   BIOPSY  01/31/2019   Procedure: BIOPSY;  Surgeon: Elicia Claw, MD;  Location: [facility] ENDOSCOPY;  Service: Gastroenterology;;   CATARACT EXTRACTION Bilateral 05/2021   ESOPHAGOGASTRODUODENOSCOPY N/A 01/31/2019   Procedure: ESOPHAGOGASTRODUODENOSCOPY (EGD);  Surgeon: Elicia Claw, MD;  Location: [facility] ENDOSCOPY;  Service: Gastroenterology;  Laterality: N/A;   I & D EXTREMITY Left 05/05/2023   Procedure: IRRIGATION AND DEBRIDEMENT LEFT CALF;  Surgeon: Harden Jerona GAILS, MD;  Location: [facility] OR;  Service: Orthopedics;  Laterality: Left;   I & D EXTREMITY Left 05/09/2023   Procedure: IRRIGATION AND DEBRIDEMENT OF LEG;  Surgeon: Georgina Ozell LABOR, MD;  Location: [facility] OR;  Service: Orthopedics;  Laterality: Left;   IR FLUORO GUIDE CV LINE RIGHT  07/25/2018   IR US  GUIDE VASC ACCESS RIGHT  07/25/2018   LEFT AND RIGHT HEART CATHETERIZATION WITH CORONARY ANGIOGRAM N/A 11/05/2013   Procedure: LEFT AND RIGHT HEART CATHETERIZATION WITH CORONARY ANGIOGRAM;  Surgeon: Peter M Jordan, MD;  Location: [facility] CATH LAB;  Service: Cardiovascular;  Laterality: N/A;   RIGHT HEART CATHETERIZATION N/A 01/14/2014   Procedure: RIGHT HEART CATH;  Surgeon: Ezra GORMAN Shuck, MD;  Location: [facility] CATH LAB;  Service: Cardiovascular;  Laterality: N/A;   Social History   Occupational History   Occupation: cabin crew  Tobacco Use   Smoking status: Never   Smokeless tobacco: Never  Substance and Sexual Activity   Alcohol use: Yes     Alcohol/week: 3.0 standard drinks of alcohol    Types: 3 Glasses of wine per week   Drug use: No   Sexual activity: Not Currently    Birth control/protection: None

## 2024-04-23 ENCOUNTER — Encounter: Payer: Self-pay | Admitting: Orthopedic Surgery

## 2024-04-23 ENCOUNTER — Ambulatory Visit: Admitting: Orthopedic Surgery

## 2024-04-23 ENCOUNTER — Telehealth: Payer: Self-pay | Admitting: Orthopedic Surgery

## 2024-04-23 VITALS — BP 129/80

## 2024-04-23 DIAGNOSIS — E0822 Diabetes mellitus due to underlying condition with diabetic chronic kidney disease: Secondary | ICD-10-CM

## 2024-04-23 DIAGNOSIS — T148XXD Other injury of unspecified body region, subsequent encounter: Secondary | ICD-10-CM

## 2024-04-23 DIAGNOSIS — S8012XD Contusion of left lower leg, subsequent encounter: Secondary | ICD-10-CM

## 2024-04-23 DIAGNOSIS — I87332 Chronic venous hypertension (idiopathic) with ulcer and inflammation of left lower extremity: Secondary | ICD-10-CM

## 2024-04-23 DIAGNOSIS — L97922 Non-pressure chronic ulcer of unspecified part of left lower leg with fat layer exposed: Secondary | ICD-10-CM

## 2024-04-23 LAB — POCT GLYCOSYLATED HEMOGLOBIN (HGB A1C): Hemoglobin A1C: 5.8 % — AB (ref 4.0–5.6)

## 2024-04-23 NOTE — Telephone Encounter (Signed)
 Lily from [employer] called. She would like DX codes for benefits pertaining to wound or ulcer. Cb [phone] fax [phone]

## 2024-04-23 NOTE — Progress Notes (Addendum)
 Office Visit Note   Patient: Jeremy Grant           Date of Birth: Dec 24, 1960           MRN: [id number] Visit Date: 04/23/2024              Requested by: Burney Darice CROME, MD [address] [place] [address] [place],  [place] [address] PCP: Burney Darice CROME, MD  Chief Complaint  Patient presents with   Left Leg - Follow-up      HPI: Discussed the use of AI scribe software for clinical note transcription with the patient, who gave verbal consent to proceed.  History of Present Illness Jeremy Grant is a 63 year old male with chronic venous insufficiency and stage four kidney disease who presents with a persistent ulceration on the left lower extremity.  He has a persistent ulceration on his left lower extremity, measuring 12 by 6 centimeters, with fibrinous exudative tissue. The ulcer is painful, especially at the bottom, and he is cautious when washing it due to the pain. He switched from using [employer] to [facility] for dressings after six days.  His history of chronic venous insufficiency is associated with brawny skin color changes.  He has stage four kidney disease and experiences low energy levels, which he attributes to his kidney condition. He has been off dialysis for five years after previously being on it. His kidney function has been declining, with a GFR of 16 one year ago, now reportedly down to 10 percent. He is concerned about his low energy levels and the potential need to restart dialysis.  His hemoglobin A1c is consistently elevated, with a current level of 5.8, indicating prediabetes.  Patient's hemoglobin A1c last year was 6.1.     Assessment & Plan: Visit Diagnoses:  1. Delayed wound healing   2. Traumatic hematoma of left lower leg, subsequent encounter   3. Chronic venous hypertension (idiopathic) with ulcer and inflammation of left lower extremity (HCC)   4. Ulcer of left lower leg, with fat layer exposed (HCC)     Plan: Assessment and  Plan Assessment & Plan Chronic venous insufficiency ulcer, left lower leg Chronic venous insufficiency ulcer on the lateral left leg, 12x6 cm, with fibrinous exudate and brawny skin changes. Painful base, absent dorsalis pedis pulse. Stage 4 kidney disease with previous GFR of 16. Ulcer shows partial healing. - Requested PRP wound treatment authorization. - Continue Vosh dressing. - Applied dressing to ulcer. - Schedule PRP procedure. Prediabetic with a hemoglobin A1c 5.8      Follow-Up Instructions: Return in about 4 weeks (around 05/21/2024).   Ortho Exam  Patient is alert, oriented, no adenopathy, well-dressed, normal affect, normal respiratory effort. Physical Exam VITALS: BP- 129/80 CARDIOVASCULAR: Strong biphasic dorsalis pedis and posterior tibial pulse on Doppler. Heart rate irregular. SKIN: Flat ulcer 12x6 cm with fibrinous exudative tissue. Brawny skin color changes with chronic venous insufficiency.      Imaging: No results found. No images are attached to the encounter.  Labs: Lab Results  Component Value Date   HGBA1C 5.8 (A) 04/23/2024   HGBA1C 6.1 (H) 05/04/2023   HGBA1C 5.4 01/12/2019     Lab Results  Component Value Date   ALBUMIN 4.6 05/28/2022   ALBUMIN 4.9 (H) 05/25/2021   ALBUMIN 4.7 04/21/2020    No results found for: MG No results found for: VD25OH  No results found for: PREALBUMIN    Latest Ref Rng & Units 05/10/2023  5:34 AM 05/09/2023    4:31 AM 05/08/2023    4:27 AM  CBC EXTENDED  WBC 4.0 - 10.5 K/uL 5.8  5.0  5.3   RBC 4.22 - 5.81 MIL/uL 2.69  2.87  2.94   Hemoglobin 13.0 - 17.0 g/dL 8.3  8.9  9.1   HCT 60.9 - 52.0 % 24.4  25.8  26.5   Platelets 150 - 400 K/uL 137  121  135      There is no height or weight on file to calculate BMI.  Orders:  Orders Placed This Encounter  Procedures   POCT HgB A1C   No orders of the defined types were placed in this encounter.    Procedures: No procedures  performed  Clinical Data: No additional findings.  ROS:  All other systems negative, except as noted in the HPI. Review of Systems  Objective: Vital Signs: BP 129/80   Specialty Comments:  No specialty comments available.  PMFS History: Patient Active Problem List   Diagnosis Date Noted   Hematoma 05/06/2023   Traumatic hematoma of left lower leg 05/04/2023   CKD (chronic kidney disease) stage 4, GFR 15-29 ml/min (HCC) 05/04/2023   Essential hypertension 05/25/2019   Spasm 03/23/2019   Steroid-induced hyperglycemia    Acute on chronic anemia    Seizures (HCC)    Spastic hemiparesis (HCC)    Vasculitis    Hypertension    Thrombocytopenia    Elevated serum protein level    Multiple myeloma (HCC)    Pancytopenia (HCC)    Normocytic normochromic anemia 07/16/2018   NICM (nonischemic cardiomyopathy) (HCC) 11/12/2013   History of ETOH abuse 11/12/2013   Chronic combined systolic and diastolic CHF (congestive heart failure) (HCC) 11/12/2013   Congestive dilated cardiomyopathy (HCC) 11/04/2013   Past Medical History:  Diagnosis Date   Arthritis    CHF (congestive heart failure) (HCC)    Combined systolic and diastolic cardiac dysfunction    Echo 11/03/2013 EF 20%, grade 3 diastolic dysfunction   Hypertension    NICM (nonischemic cardiomyopathy) (HCC)    a. L/RHC (11/05/13): RA: 3, RV 52/5, PA 49/19 (31), PCWP 10, AO 166/93, PA 67%, Fick CO/CI: 5.71/2.97, Lmain: normal, LAD: large, without signficant dz, first diagonal has 20% dz at ostium, LCx: normal, RCA: 30% stenosis at the bifurcation of PDA and PLOM   Stroke [facility])     Family History  Problem Relation Age of Onset   Heart attack Father 49   Heart disease Father    Arrhythmia Father    Hypertension Brother    Hypertension Brother     Past Surgical History:  Procedure Laterality Date   BIOPSY  01/31/2019   Procedure: BIOPSY;  Surgeon: Elicia Claw, MD;  Location: [facility] ENDOSCOPY;  Service: Gastroenterology;;    CATARACT EXTRACTION Bilateral 05/2021   ESOPHAGOGASTRODUODENOSCOPY N/A 01/31/2019   Procedure: ESOPHAGOGASTRODUODENOSCOPY (EGD);  Surgeon: Elicia Claw, MD;  Location: [facility] ENDOSCOPY;  Service: Gastroenterology;  Laterality: N/A;   I & D EXTREMITY Left 05/05/2023   Procedure: IRRIGATION AND DEBRIDEMENT LEFT CALF;  Surgeon: Harden Jerona GAILS, MD;  Location: [facility] OR;  Service: Orthopedics;  Laterality: Left;   I & D EXTREMITY Left 05/09/2023   Procedure: IRRIGATION AND DEBRIDEMENT OF LEG;  Surgeon: Georgina Ozell LABOR, MD;  Location: [facility] OR;  Service: Orthopedics;  Laterality: Left;   IR FLUORO GUIDE CV LINE RIGHT  07/25/2018   IR US  GUIDE VASC ACCESS RIGHT  07/25/2018   LEFT AND RIGHT HEART CATHETERIZATION WITH CORONARY  ANGIOGRAM N/A 11/05/2013   Procedure: LEFT AND RIGHT HEART CATHETERIZATION WITH CORONARY ANGIOGRAM;  Surgeon: Peter M Jordan, MD;  Location: [facility] CATH LAB;  Service: Cardiovascular;  Laterality: N/A;   RIGHT HEART CATHETERIZATION N/A 01/14/2014   Procedure: RIGHT HEART CATH;  Surgeon: Ezra GORMAN Shuck, MD;  Location[facility] CATH LAB;  Service: Cardiovascular;  Laterality: N/A;   Social History   Occupational History   Occupation: cabin crew  Tobacco Use   Smoking status: Never   Smokeless tobacco: Never  Substance and Sexual Activity   Alcohol use: Yes    Alcohol/week: 3.0 standard drinks of alcohol    Types: 3 Glasses of wine per week   Drug use: No   Sexual activity: Not Currently    Birth control/protection: None

## 2024-04-24 NOTE — Telephone Encounter (Signed)
 I called and gave updated ICD 10 codes for this pt. Will sign off and await insurance approval

## 2024-04-28 ENCOUNTER — Inpatient Hospital Stay (HOSPITAL_COMMUNITY)
Admission: EM | Admit: 2024-04-28 | Discharge: 2024-05-04 | DRG: 291 | Disposition: A | Discharge status: Home Health | Attending: Internal Medicine | Admitting: Internal Medicine

## 2024-04-28 ENCOUNTER — Emergency Department (HOSPITAL_COMMUNITY)

## 2024-04-28 ENCOUNTER — Encounter (HOSPITAL_COMMUNITY): Payer: Self-pay | Admitting: Emergency Medicine

## 2024-04-28 DIAGNOSIS — K5790 Diverticulosis of intestine, part unspecified, without perforation or abscess without bleeding: Secondary | ICD-10-CM | POA: Diagnosis present

## 2024-04-28 DIAGNOSIS — E872 Acidosis, unspecified: Secondary | ICD-10-CM | POA: Diagnosis present

## 2024-04-28 DIAGNOSIS — Z1152 Encounter for screening for COVID-19: Secondary | ICD-10-CM | POA: Diagnosis not present

## 2024-04-28 DIAGNOSIS — I5042 Chronic combined systolic (congestive) and diastolic (congestive) heart failure: Secondary | ICD-10-CM | POA: Diagnosis present

## 2024-04-28 DIAGNOSIS — I959 Hypotension, unspecified: Secondary | ICD-10-CM | POA: Diagnosis present

## 2024-04-28 DIAGNOSIS — E43 Unspecified severe protein-calorie malnutrition: Secondary | ICD-10-CM | POA: Diagnosis present

## 2024-04-28 DIAGNOSIS — I132 Hypertensive heart and chronic kidney disease with heart failure and with stage 5 chronic kidney disease, or end stage renal disease: Principal | ICD-10-CM | POA: Diagnosis present

## 2024-04-28 DIAGNOSIS — E871 Hypo-osmolality and hyponatremia: Secondary | ICD-10-CM | POA: Diagnosis present

## 2024-04-28 DIAGNOSIS — I7782 Antineutrophilic cytoplasmic antibody (ANCA) vasculitis: Secondary | ICD-10-CM | POA: Diagnosis present

## 2024-04-28 DIAGNOSIS — Z8249 Family history of ischemic heart disease and other diseases of the circulatory system: Secondary | ICD-10-CM | POA: Diagnosis not present

## 2024-04-28 DIAGNOSIS — I69354 Hemiplegia and hemiparesis following cerebral infarction affecting left non-dominant side: Secondary | ICD-10-CM

## 2024-04-28 DIAGNOSIS — I428 Other cardiomyopathies: Secondary | ICD-10-CM | POA: Diagnosis present

## 2024-04-28 DIAGNOSIS — N179 Acute kidney failure, unspecified: Principal | ICD-10-CM

## 2024-04-28 DIAGNOSIS — N186 End stage renal disease: Secondary | ICD-10-CM | POA: Diagnosis present

## 2024-04-28 DIAGNOSIS — K5900 Constipation, unspecified: Secondary | ICD-10-CM | POA: Diagnosis present

## 2024-04-28 DIAGNOSIS — L97921 Non-pressure chronic ulcer of unspecified part of left lower leg limited to breakdown of skin: Secondary | ICD-10-CM | POA: Diagnosis not present

## 2024-04-28 DIAGNOSIS — E870 Hyperosmolality and hypernatremia: Secondary | ICD-10-CM | POA: Diagnosis present

## 2024-04-28 DIAGNOSIS — D696 Thrombocytopenia, unspecified: Secondary | ICD-10-CM | POA: Diagnosis present

## 2024-04-28 DIAGNOSIS — Z681 Body mass index (BMI) 19 or less, adult: Secondary | ICD-10-CM

## 2024-04-28 DIAGNOSIS — Z9841 Cataract extraction status, right eye: Secondary | ICD-10-CM

## 2024-04-28 DIAGNOSIS — Z992 Dependence on renal dialysis: Secondary | ICD-10-CM | POA: Diagnosis not present

## 2024-04-28 DIAGNOSIS — Z79624 Long term (current) use of inhibitors of nucleotide synthesis: Secondary | ICD-10-CM | POA: Diagnosis not present

## 2024-04-28 DIAGNOSIS — G253 Myoclonus: Secondary | ICD-10-CM | POA: Diagnosis present

## 2024-04-28 DIAGNOSIS — Z9842 Cataract extraction status, left eye: Secondary | ICD-10-CM

## 2024-04-28 DIAGNOSIS — D631 Anemia in chronic kidney disease: Secondary | ICD-10-CM | POA: Diagnosis present

## 2024-04-28 DIAGNOSIS — Z79899 Other long term (current) drug therapy: Secondary | ICD-10-CM

## 2024-04-28 DIAGNOSIS — N2581 Secondary hyperparathyroidism of renal origin: Secondary | ICD-10-CM | POA: Diagnosis present

## 2024-04-28 DIAGNOSIS — Z7982 Long term (current) use of aspirin: Secondary | ICD-10-CM | POA: Diagnosis not present

## 2024-04-28 LAB — COMPREHENSIVE METABOLIC PANEL WITH GFR
ALT: 20 U/L (ref 0–44)
AST: 20 U/L (ref 15–41)
Albumin: 4.4 g/dL (ref 3.5–5.0)
Alkaline Phosphatase: 77 U/L (ref 38–126)
Anion gap: 32 — ABNORMAL HIGH (ref 5–15)
BUN: 217 mg/dL — ABNORMAL HIGH (ref 8–23)
CO2: 10 mmol/L — ABNORMAL LOW (ref 22–32)
Calcium: 6.5 mg/dL — ABNORMAL LOW (ref 8.9–10.3)
Chloride: 87 mmol/L — ABNORMAL LOW (ref 98–111)
Creatinine, Ser: 10.6 mg/dL — ABNORMAL HIGH (ref 0.61–1.24)
GFR, Estimated: 5 mL/min — ABNORMAL LOW (ref >60)
Glucose, Bld: 161 mg/dL — ABNORMAL HIGH (ref 70–99)
Potassium: 3.6 mmol/L (ref 3.5–5.1)
Sodium: 129 mmol/L — ABNORMAL LOW (ref 135–145)
Total Bilirubin: 0.6 mg/dL (ref 0.0–1.2)
Total Protein: 7.1 g/dL (ref 6.5–8.1)

## 2024-04-28 LAB — URINALYSIS, MICROSCOPIC (REFLEX)

## 2024-04-28 LAB — CBC
HCT: 28 % — ABNORMAL LOW (ref 39.0–52.0)
Hemoglobin: 14 g/dL (ref 13.0–17.0)
Hemoglobin: 10.4 g/dL — ABNORMAL LOW (ref 13.0–17.0)
MCH: 30.2 pg (ref 26.0–34.0)
MCHC: 37.1 g/dL — ABNORMAL HIGH (ref 30.0–36.0)
MCV: 81.4 fL (ref 80.0–100.0)
Platelets: 70 K/uL — ABNORMAL LOW (ref 150–400)
Platelets: 75 K/uL — ABNORMAL LOW (ref 150–400)
RBC: 3.44 MIL/uL — ABNORMAL LOW (ref 4.22–5.81)
RDW: 13.1 % (ref 11.5–15.5)
WBC: 5.6 K/uL (ref 4.0–10.5)
WBC: 5.6 K/uL (ref 4.0–10.5)
nRBC: 0 % (ref 0.0–0.2)

## 2024-04-28 LAB — URINALYSIS, ROUTINE W REFLEX MICROSCOPIC
Bilirubin Urine: NEGATIVE
Glucose, UA: NEGATIVE mg/dL
Ketones, ur: NEGATIVE mg/dL
Leukocytes,Ua: NEGATIVE
Nitrite: NEGATIVE
Protein, ur: 30 mg/dL — AB
Specific Gravity, Urine: 1.015 (ref 1.005–1.030)
pH: 5.5 (ref 5.0–8.0)

## 2024-04-28 LAB — RESP PANEL BY RT-PCR (RSV, FLU A&B, COVID)  RVPGX2
Influenza A by PCR: NEGATIVE
Influenza B by PCR: NEGATIVE
Resp Syncytial Virus by PCR: NEGATIVE
SARS Coronavirus 2 by RT PCR: NEGATIVE

## 2024-04-28 LAB — TSH: TSH: 1.99 u[IU]/mL (ref 0.350–4.500)

## 2024-04-28 LAB — T4, FREE: Free T4: 1.52 ng/dL (ref 0.80–2.00)

## 2024-04-28 LAB — CREATININE, SERUM
Creatinine, Ser: 10.4 mg/dL — ABNORMAL HIGH (ref 0.61–1.24)
GFR, Estimated: 5 mL/min — ABNORMAL LOW (ref >60)

## 2024-04-28 LAB — PROTIME-INR
INR: 1.1 (ref 0.8–1.2)
Prothrombin Time: 14.4 s (ref 11.4–15.2)

## 2024-04-28 MED ORDER — OXYCODONE HCL 5 MG PO TABS
5.0000 mg | ORAL_TABLET | Freq: Four times a day (QID) | ORAL | Status: DC | PRN
  Administered 2024-04-28 – 2024-04-29 (×2): 5 mg via ORAL
  Filled 2024-04-28 (×2): qty 1

## 2024-04-28 MED ORDER — CHLORHEXIDINE GLUCONATE CLOTH 2 % EX PADS
6.0000 | MEDICATED_PAD | Freq: Every day | CUTANEOUS | Status: DC
  Administered 2024-04-30 – 2024-05-04 (×5): 6 via TOPICAL

## 2024-04-28 MED ORDER — SODIUM CHLORIDE 0.9 % IV BOLUS
1000.0000 mL | Freq: Once | INTRAVENOUS | Status: AC
  Administered 2024-04-28: 17:00:00 1000 mL via INTRAVENOUS

## 2024-04-28 MED ORDER — PREDNISONE 5 MG PO TABS
5.0000 mg | ORAL_TABLET | Freq: Every day | ORAL | Status: DC
  Administered 2024-04-29 – 2024-05-04 (×5): 5 mg via ORAL
  Filled 2024-04-28 (×5): qty 1

## 2024-04-28 MED ORDER — ONDANSETRON HCL 4 MG PO TABS: 4.0000 mg | ORAL_TABLET | Freq: Four times a day (QID) | ORAL | Status: DC | PRN

## 2024-04-28 MED ORDER — OXYCODONE-ACETAMINOPHEN 5-325 MG PO TABS
1.0000 | ORAL_TABLET | Freq: Four times a day (QID) | ORAL | Status: DC | PRN
  Administered 2024-04-28 – 2024-04-29 (×3): 1 via ORAL
  Filled 2024-04-28 (×3): qty 1

## 2024-04-28 MED ORDER — CALCIUM GLUCONATE-NACL 1-0.675 GM/50ML-% IV SOLN
1.0000 g | Freq: Once | INTRAVENOUS | Status: AC
  Administered 2024-04-28: 22:00:00 1000 mg via INTRAVENOUS
  Filled 2024-04-28: qty 50

## 2024-04-28 MED ORDER — ACETAMINOPHEN 325 MG PO TABS: 650.0000 mg | ORAL_TABLET | Freq: Four times a day (QID) | ORAL | Status: AC | PRN

## 2024-04-28 MED ORDER — POLYETHYLENE GLYCOL 3350 17 G PO PACK
17.0000 g | PACK | Freq: Every day | ORAL | Status: DC
  Administered 2024-04-29: 11:00:00 17 g via ORAL
  Filled 2024-04-28 (×2): qty 1

## 2024-04-28 MED ORDER — HEPARIN SODIUM (PORCINE) 5000 UNIT/ML IJ SOLN
5000.0000 [IU] | Freq: Three times a day (TID) | INTRAMUSCULAR | Status: DC
  Administered 2024-04-28 – 2024-04-29 (×2): 5000 [IU] via SUBCUTANEOUS
  Filled 2024-04-28 (×2): qty 1

## 2024-04-28 MED ORDER — TIZANIDINE HCL 4 MG PO TABS
4.0000 mg | ORAL_TABLET | Freq: Three times a day (TID) | ORAL | Status: DC | PRN
  Administered 2024-04-29: 20:00:00 4 mg via ORAL
  Filled 2024-04-28: qty 1

## 2024-04-28 MED ORDER — ONDANSETRON HCL 4 MG/2ML IJ SOLN
4.0000 mg | Freq: Four times a day (QID) | INTRAMUSCULAR | Status: DC | PRN
  Administered 2024-04-29 – 2024-04-30 (×3): 4 mg via INTRAVENOUS
  Filled 2024-04-28 (×3): qty 2

## 2024-04-28 MED ORDER — OXYCODONE-ACETAMINOPHEN 10-325 MG PO TABS: 1.0000 | ORAL_TABLET | Freq: Four times a day (QID) | ORAL | Status: DC | PRN

## 2024-04-28 MED ORDER — LACTATED RINGERS IV SOLN: INTRAVENOUS | Status: AC

## 2024-04-28 MED ORDER — ATORVASTATIN CALCIUM 80 MG PO TABS
80.0000 mg | ORAL_TABLET | Freq: Every day | ORAL | Status: DC
  Administered 2024-04-29 – 2024-05-03 (×4): 80 mg via ORAL
  Filled 2024-04-28 (×4): qty 1

## 2024-04-28 MED ORDER — ASPIRIN 81 MG PO TBEC
81.0000 mg | DELAYED_RELEASE_TABLET | Freq: Every day | ORAL | Status: DC
  Administered 2024-04-28 – 2024-05-04 (×6): 81 mg via ORAL
  Filled 2024-04-28 (×6): qty 1

## 2024-04-28 MED ORDER — AZATHIOPRINE 50 MG PO TABS
50.0000 mg | ORAL_TABLET | Freq: Every day | ORAL | Status: DC
  Administered 2024-04-28 – 2024-05-04 (×5): 50 mg via ORAL
  Filled 2024-04-28 (×8): qty 1

## 2024-04-28 MED ORDER — ACETAMINOPHEN 650 MG RE SUPP: 650.0000 mg | Freq: Four times a day (QID) | RECTAL | Status: AC | PRN

## 2024-04-28 NOTE — ED Provider Notes (Signed)
 [place] EMERGENCY DEPARTMENT AT [facility] Provider Note   CSN: [id number] Arrival date & time: 04/28/24  1535     Patient presents with: Weakness   Jeremy Grant is a 63 y.o. male.    Weakness Associated symptoms: nausea and vomiting      63 year old male with medical history significant for HTN, CVA, HLD, history of microscopic polyangiitis and CKD presenting to the emergency department with profound weakness, fatigue over the past week.  The patient is concerned that his renal function has declined.  He had previously been on dialysis about 5 years ago but came off of it.  He endorses nausea, had an episode of NBNB emesis as well.  He has had decreased p.o. intake over the past few days.  He denies any history of renal transplant.  He has had decreased appetite.  Prior to Admission medications  Medication Sig Start Date End Date Taking? Authorizing Provider  acetaminophen  (TYLENOL ) 325 MG tablet Take 2 tablets (650 mg total) by mouth every 6 (six) hours as needed for moderate pain. 02/03/19   Angiulli, Toribio PARAS, PA-C  amLODipine  (NORVASC ) 10 MG tablet Take 1 tablet (10 mg total) by mouth daily. 02/04/19   Angiulli, Toribio PARAS, PA-C  aspirin  EC 81 MG EC tablet Take 1 tablet (81 mg total) by mouth daily. 02/04/19   Angiulli, Toribio PARAS, PA-C  atorvastatin  (LIPITOR ) 80 MG tablet Take 1 tablet (80 mg total) by mouth daily at 6 PM. 02/17/19 05/03/24  Debby Fidela CROME, NP  azaTHIOprine  (IMURAN ) 50 MG tablet Take 1 tablet (50 mg total) by mouth daily. 02/04/19   Angiulli, Toribio PARAS, PA-C  carvedilol  (COREG ) 12.5 MG tablet Take 12.5 mg by mouth 2 (two) times daily with a meal.    [provider]  doxazosin  (CARDURA ) 8 MG tablet 8 mg 2 (two) times daily. Increased to 4 mg 2x/day per pt conversation with nephrologist    [provider]  HYDROmorphone  (DILAUDID ) 2 MG tablet Take 0.5 tablets (1 mg total) by mouth every 6 (six) hours as needed for up to 12 doses for  severe pain (pain score 7-10). 05/10/23   Eben Reyes BROCKS, MD  oxyCODONE -acetaminophen  (PERCOCET) 10-325 MG tablet Take 1 tablet by mouth every 6 (six) hours as needed for pain. 05/20/23   Harden Jerona GAILS, MD  polyethylene glycol powder (GLYCOLAX /MIRALAX ) 17 GM/SCOOP powder Take 1 capful (17 g) by mouth daily. 05/10/23   Arellano Zameza, Priscila, MD  predniSONE  (DELTASONE ) 5 MG tablet Take 5 mg by mouth daily with breakfast.    [provider]  tiZANidine  (ZANAFLEX ) 4 MG tablet Take 1 tablet (4 mg total) by mouth every 8 (eight) hours as needed for muscle spasms. 05/28/22   Whitfield Raisin, NP    Allergies: Hydralazine  hcl    Review of Systems  Constitutional:  Positive for fatigue.  Gastrointestinal:  Positive for nausea and vomiting.  Neurological:  Positive for weakness.  All other systems reviewed and are negative.   Updated Vital Signs BP (!) 98/59 (BP Location: Left Arm)   Pulse 70   Temp (!) 97.5 F (36.4 C)   Resp 15   SpO2 100%   Physical Exam Vitals and nursing note reviewed.  Constitutional:      General: He is not in acute distress.    Appearance: He is well-developed. He is ill-appearing.  HENT:     Head: Normocephalic and atraumatic.  Eyes:     Conjunctiva/sclera: Conjunctivae normal.  Cardiovascular:  Rate and Rhythm: Normal rate and regular rhythm.     Heart sounds: No murmur heard. Pulmonary:     Effort: Pulmonary effort is normal. No respiratory distress.     Breath sounds: Normal breath sounds.  Abdominal:     Palpations: Abdomen is soft.     Tenderness: There is no abdominal tenderness.  Musculoskeletal:        General: No swelling.     Cervical back: Neck supple.  Skin:    General: Skin is warm and dry.     Capillary Refill: Capillary refill takes less than 2 seconds.  Neurological:     Mental Status: He is alert.  Psychiatric:        Mood and Affect: Mood normal.     (all labs ordered are listed, but only abnormal results are  displayed) Labs Reviewed  COMPREHENSIVE METABOLIC PANEL WITH GFR - Abnormal; Notable for the following components:      Result Value   Sodium 129 (*)    Chloride 87 (*)    CO2 10 (*)    Glucose, Bld 161 (*)    BUN 217 (*)    Creatinine, Ser 10.60 (*)    Calcium  6.5 (*)    GFR, Estimated 5 (*)    Anion gap 32 (*)    All other components within normal limits  CBC - Abnormal; Notable for the following components:   Platelets 70 (*)    All other components within normal limits  URINALYSIS, ROUTINE W REFLEX MICROSCOPIC - Abnormal; Notable for the following components:   Hgb urine dipstick SMALL (*)    Protein, ur 30 (*)    All other components within normal limits  URINALYSIS, MICROSCOPIC (REFLEX) - Abnormal; Notable for the following components:   Bacteria, UA RARE (*)    All other components within normal limits  RESP PANEL BY RT-PCR (RSV, FLU A&B, COVID)  RVPGX2  PROTIME-INR  T4, FREE  TSH  CBG MONITORING, ED    EKG: EKG Interpretation Date/Time:  Tuesday April 28 2024 16:15:21 EST Ventricular Rate:  67 PR Interval:  184 QRS Duration:  124 QT Interval:  462 QTC Calculation: 488 R Axis:   -21  Text Interpretation: Normal sinus rhythm Non-specific intra-ventricular conduction delay Minimal voltage criteria for LVH, may be normal variant ( [employer] product ) Nonspecific ST abnormality Abnormal ECG When compared with ECG of 19-Jan-2019 09:04, PREVIOUS ECG IS PRESENT Confirmed by Jerrol Agent ([id number]) on 04/28/2024 4:53:17 PM  Radiology: CT ABDOMEN PELVIS WO CONTRAST Result Date: 04/28/2024 EXAM: CT ABDOMEN AND PELVIS WITHOUT CONTRAST 04/28/2024 07:00:55 PM TECHNIQUE: CT of the abdomen and pelvis was performed without the administration of intravenous contrast. Multiplanar reformatted images are provided for review. Automated exposure control, iterative reconstruction, and/or weight-based adjustment of the mA/kV was utilized to reduce the radiation dose to as low as reasonably  achievable. COMPARISON: CT abdomen and pelvis 09/15/2020. CLINICAL HISTORY: aki FINDINGS: LOWER CHEST: No acute abnormality. LIVER: The liver is unremarkable. GALLBLADDER AND BILE DUCTS: Gallbladder is unremarkable. No biliary ductal dilatation. SPLEEN: No acute abnormality. PANCREAS: No acute abnormality. ADRENAL GLANDS: No acute abnormality. KIDNEYS, URETERS AND BLADDER: There is mild diffuse renal atrophy bilaterally. There is a single punctate calculus in the right kidney. No stones in the ureters. No hydronephrosis. No perinephric or periureteral stranding. The bladder is decompressed by Foley catheter. GI AND BOWEL: Stomach demonstrates no acute abnormality. There is sigmoid and descending colon diverticulosis. The appendix is within normal limits. There is a  large amount of stool in the rectum. There is no bowel obstruction. PERITONEUM AND RETROPERITONEUM: No ascites. No free air. VASCULATURE: Aorta is normal in caliber. There are atherosclerotic calcifications of the aorta. LYMPH NODES: No lymphadenopathy. REPRODUCTIVE ORGANS: No acute abnormality. BONES AND SOFT TISSUES: No acute osseous abnormality. No focal soft tissue abnormality. IMPRESSION: 1. Mild diffuse renal atrophy bilaterally with a single punctate calculus in the right kidney. 2. Sigmoid and descending colon diverticulosis without evidence of diverticulitis. 3. Large amount of stool in the rectum. Electronically signed by: Greig Pique MD 04/28/2024 07:10 PM EST RP Workstation: [id number]   DG Chest 1 View Result Date: 04/28/2024 CLINICAL DATA:  Weakness. EXAM: CHEST  1 VIEW COMPARISON:  Chest radiograph dated 07/16/2018. FINDINGS: Faint interstitial densities may be chronic or atypical infiltrate. No consolidative changes. There is no pleural effusion or pneumothorax. Mild cardiomegaly. No acute osseous pathology. IMPRESSION: Chronic interstitial coarsening versus atypical infiltrate. No consolidative changes. Electronically Signed   By:  Vanetta Chou M.D.   On: 04/28/2024 18:18     .Critical Care  Performed by: Jerrol Agent, MD Authorized by: Jerrol Agent, MD   Critical care provider statement:    Critical care time (minutes):  30   Critical care was time spent personally by me on the following activities:  Development of treatment plan with patient or surrogate, discussions with consultants, evaluation of patient's response to treatment, examination of patient, ordering and review of laboratory studies, ordering and review of radiographic studies, ordering and performing treatments and interventions, pulse oximetry, re-evaluation of patient's condition and review of old charts   Care discussed with: admitting provider      Medications Ordered in the ED  sodium chloride  0.9 % bolus 1,000 mL (0 mLs Intravenous Stopped 04/28/24 1856)    Clinical Course as of 04/28/24 2027  Tue Apr 28, 2024  2005 Creatinine(!): 10.60 [JL]  2005 BUN(!): 217 [JL]    Clinical Course User Index [JL] Jerrol Agent, MD                                 Medical Decision Making Amount and/or Complexity of Data Reviewed Labs: ordered. Decision-making details documented in ED Course. Radiology: ordered.  Risk Decision regarding hospitalization.    63 year old male with medical history significant for HTN, CVA, HLD, history of microscopic polyangiitis and CKD presenting to the emergency department with profound weakness, fatigue over the past week.  The patient is concerned that his renal function has declined.  He had previously been on dialysis about 5 years ago but came off of it.  He endorses nausea, had an episode of NBNB emesis as well.  He has had decreased p.o. intake over the past few days.  He denies any history of renal transplant.  He has had decreased appetite.  On arrival, the patient was afebrile, not tachycardic or tachypneic, BP 98/59, saturating her percent on room air.  On exam the patient was ill-appearing and  appeared very fatigued and frail, had clear lungs auscultation with no abdominal tenderness.  Concern for worsening CKD and AKI on CKD in the setting of decreased p.o. intake.  IV access was obtained and the patient was administered an IV fluid bolus.  Screening labs were obtained.  Labs: CMP with concern for significant worsening of the patient's renal function with a creatinine of 10.6 and a BUN of 217, anion gap acidosis with a bicarbonate of 10 and  an anion gap of 32 likely due to uremia.  CBC without a leukocytosis or anemia, thrombocytopenia noted to 70, TSH pending, free T4 normal, COVID flu and RSV PCR testing negative, urinalysis unconvincing for UTI.  CXR: IMPRESSION:  Chronic interstitial coarsening versus atypical infiltrate. No  consolidative changes.    Spoke with Dr. Jerrye of on-call nephrology who recommended CT of the abdomen pelvis, bladder scan and if greater than 300 cc in the bladder, insert Foley, will see in consultation for likely initiation of hemodialysis inpatient, recommended medicine admission.  CT Abd Pel WO: IMPRESSION:  1. Mild diffuse renal atrophy bilaterally with a single punctate calculus in  the right kidney.  2. Sigmoid and descending colon diverticulosis without evidence of  diverticulitis.  3. Large amount of stool in the rectum.   In the setting or acute on chronic renal failure necessitating likely hemodialysis inpatient, medicine was consulted for admission, Dr. Vernon accepting.     Final diagnoses:  AKI (acute kidney injury)  ESRD needing dialysis [facility])    ED Discharge Orders     None          Jerrol Agent, MD 04/28/24 2046

## 2024-04-28 NOTE — ED Notes (Signed)
 Patient had foley catheter inserted prior to this RN taking over care. Prior RN did not chart foley placement so unsure of who it was placed by.

## 2024-04-28 NOTE — H&P (Signed)
 History and Physical    Jeremy Grant FMW:[id number] DOB: 1960-05-26 DOA: 04/28/2024  PCP: Burney Darice CROME, MD  Patient coming from: Home  I have personally briefly reviewed patient's old medical records in [facility]  Chief Complaint: Generalized weakness  HPI: Jeremy Grant is a 63 y.o. male with medical history significant of chronic kidney disease stage V, hypertension, nonischemic cardiomyopathy systolic and diastolic CHF who presented to ED with significantly profound weakness which is ongoing for about 2 to 3 days.  He denies any other complaint such as nausea, vomiting, fever, chills, sweating, chest pain or shortness of breath.  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  He was last seen on 02/26/2024 in our office with a creatinine of 5.81, BUN 112, GFR 10, and K 4.3.  Per charting, if flares again Dr. Gearline would advocate for Rituxan and avacopan per her note.  Note 11/28/23 labs with Cr 4.69, BUN 88, and eGFR 13.  He has been on pred 5 mg daily and azathioprine  50 mg daily.   ED Course: Upon arrival to ED, he is afebrile, fairly hemodynamically stable with a slightly low blood pressure of 98/59.  Creatinine which was 5.81 just 2 months ago now is 10.6 with BUN of 217 and CO2 of 10 and sodium 129.  Has chronic anemia and thrombocytopenia.  Chest x-ray unremarkable.  Due to worsening CKD, nephrology was consulted who recommended CT abdomen and pelvis which shows diverticulosis and diffuse renal atrophy bilaterally and a single punctate calculus in right kidney.  Large stool burden.  Nephrology opined that patient is going to need dialysis and thus recommended admission to hospital service  Review of Systems: As per HPI otherwise negative.    Past Medical History:  Diagnosis Date   Arthritis    CHF  (congestive heart failure) (HCC)    Combined systolic and diastolic cardiac dysfunction    Echo 11/03/2013 EF 20%, grade 3 diastolic dysfunction   Hypertension    NICM (nonischemic cardiomyopathy) (HCC)    a. L/RHC (11/05/13): RA: 3, RV 52/5, PA 49/19 (31), PCWP 10, AO 166/93, PA 67%, Fick CO/CI: 5.71/2.97, Lmain: normal, LAD: large, without signficant dz, first diagonal has 20% dz at ostium, LCx: normal, RCA: 30% stenosis at the bifurcation of PDA and PLOM   Stroke [facility])     Past Surgical History:  Procedure Laterality Date   BIOPSY  01/31/2019   Procedure: BIOPSY;  Surgeon: Elicia Claw, MD;  Location[facility] ENDOSCOPY;  Service: Gastroenterology;;   CATARACT EXTRACTION Bilateral 05/2021   ESOPHAGOGASTRODUODENOSCOPY N/A 01/31/2019   Procedure: ESOPHAGOGASTRODUODENOSCOPY (EGD);  Surgeon: Elicia Claw, MD;  Location: [facility] ENDOSCOPY;  Service: Gastroenterology;  Laterality: N/A;   I & D EXTREMITY Left 05/05/2023   Procedure: IRRIGATION AND DEBRIDEMENT LEFT CALF;  Surgeon: Harden Jerona GAILS, MD;  Location: [facility] OR;  Service: Orthopedics;  Laterality: Left;   I & D EXTREMITY Left 05/09/2023   Procedure: IRRIGATION AND DEBRIDEMENT OF LEG;  Surgeon: Georgina Ozell LABOR, MD;  Location: [facility] OR;  Service: Orthopedics;  Laterality: Left;   IR FLUORO GUIDE CV LINE RIGHT  07/25/2018   IR US  GUIDE VASC ACCESS RIGHT  07/25/2018   LEFT AND RIGHT HEART CATHETERIZATION WITH CORONARY ANGIOGRAM N/A 11/05/2013   Procedure: LEFT AND RIGHT HEART CATHETERIZATION WITH CORONARY ANGIOGRAM;  Surgeon: Peter M Jordan, MD;  Location: [facility] CATH LAB;  Service: Cardiovascular;  Laterality: N/A;   RIGHT HEART CATHETERIZATION N/A 01/14/2014   Procedure: RIGHT HEART CATH;  Surgeon: Ezra GORMAN Shuck, MD;  Location: [facility] CATH LAB;  Service: Cardiovascular;  Laterality: N/A;     reports that he has never smoked. He has never used smokeless tobacco. He reports current alcohol use of about 3.0 standard drinks of alcohol per week. He  reports that he does not use drugs.  Allergies[1]  Family History  Problem Relation Age of Onset   Heart attack Father 69   Heart disease Father    Arrhythmia Father    Hypertension Brother    Hypertension Brother     Prior to Admission medications  Medication Sig Start Date End Date Taking? Authorizing Provider  acetaminophen  (TYLENOL ) 325 MG tablet Take 2 tablets (650 mg total) by mouth every 6 (six) hours as needed for moderate pain. 02/03/19   Angiulli, Toribio PARAS, PA-C  amLODipine  (NORVASC ) 10 MG tablet Take 1 tablet (10 mg total) by mouth daily. 02/04/19   Angiulli, Toribio PARAS, PA-C  aspirin  EC 81 MG EC tablet Take 1 tablet (81 mg total) by mouth daily. 02/04/19   Angiulli, Toribio PARAS, PA-C  atorvastatin  (LIPITOR ) 80 MG tablet Take 1 tablet (80 mg total) by mouth daily at 6 PM. 02/17/19 05/03/24  Debby Fidela CROME, NP  azaTHIOprine  (IMURAN ) 50 MG tablet Take 1 tablet (50 mg total) by mouth daily. 02/04/19   Angiulli, Toribio PARAS, PA-C  carvedilol  (COREG ) 12.5 MG tablet Take 12.5 mg by mouth 2 (two) times daily with a meal.    [provider]  doxazosin  (CARDURA ) 8 MG tablet 8 mg 2 (two) times daily. Increased to 4 mg 2x/day per pt conversation with nephrologist    [provider]  HYDROmorphone  (DILAUDID ) 2 MG tablet Take 0.5 tablets (1 mg total) by mouth every 6 (six) hours as needed for up to 12 doses for severe pain (pain score 7-10). 05/10/23   Eben Reyes BROCKS, MD  oxyCODONE -acetaminophen  (PERCOCET) 10-325 MG tablet Take 1 tablet by mouth every 6 (six) hours as needed for pain. 05/20/23   Harden Jerona GAILS, MD  polyethylene glycol powder (GLYCOLAX /MIRALAX ) 17 GM/SCOOP powder Take 1 capful (17 g) by mouth daily. 05/10/23   Arellano Zameza, Priscila, MD  predniSONE  (DELTASONE ) 5 MG tablet Take 5 mg by mouth daily with breakfast.    [provider]  tiZANidine  (ZANAFLEX ) 4 MG tablet Take 1 tablet (4 mg total) by mouth every 8 (eight) hours as needed for muscle spasms.  05/28/22   Whitfield Raisin, NP    Physical Exam: Vitals:   04/28/24 1543  BP: (!) 98/59  Pulse: 70  Resp: 15  Temp: (!) 97.5 F (36.4 C)  SpO2: 100%    Constitutional: NAD, calm, comfortable Vitals:   04/28/24 1543  BP: (!) 98/59  Pulse: 70  Resp: 15  Temp: (!) 97.5 F (36.4 C)  SpO2: 100%   Eyes: PERRL, lids and conjunctivae normal ENMT: Mucous membranes are moist. Posterior pharynx clear of any exudate or lesions.Normal dentition.  Neck: normal, supple, no masses, no thyromegaly Respiratory: clear to auscultation bilaterally, no wheezing, no crackles. Normal respiratory effort. No accessory muscle use.  Cardiovascular: Regular rate and rhythm, no murmurs / rubs / gallops. No extremity edema. 2+ pedal pulses. No carotid bruits.  Abdomen: no tenderness, no masses palpated. No hepatosplenomegaly. Bowel sounds positive.  Musculoskeletal: no clubbing / cyanosis. No joint  deformity upper and lower extremities. Good ROM, no contractures. Normal muscle tone.  Skin: no rashes, lesions, ulcers. No induration Neurologic: CN 2-12 grossly intact. Sensation intact, DTR normal. Strength 5/5 in all 4.  Psychiatric: Normal judgment and insight. Alert and oriented x 3. Normal mood.    Labs on Admission: I have personally reviewed following labs and imaging studies  CBC: Recent Labs  Lab 04/28/24 1538  WBC 5.6  HGB 14.0  HCT RESULTS UNAVAILABLE DUE TO INTERFERING SUBSTANCE  MCV RESULTS UNAVAILABLE DUE TO INTERFERING SUBSTANCE  PLT 70*   Basic Metabolic Panel: Recent Labs  Lab 04/28/24 1538  NA 129*  K 3.6  CL 87*  CO2 10*  GLUCOSE 161*  BUN 217*  CREATININE 10.60*  CALCIUM  6.5*   GFR: CrCl cannot be calculated (Unknown ideal weight.). Liver Function Tests: Recent Labs  Lab 04/28/24 1538  AST 20  ALT 20  ALKPHOS 77  BILITOT 0.6  PROT 7.1  ALBUMIN 4.4   No results for input(s): LIPASE, AMYLASE in the last 168 hours. No results for input(s): AMMONIA in the  last 168 hours. Coagulation Profile: Recent Labs  Lab 04/28/24 1538  INR 1.1   Cardiac Enzymes: No results for input(s): CKTOTAL, CKMB, CKMBINDEX, TROPONINI in the last 168 hours. BNP (last 3 results) No results for input(s): PROBNP in the last 8760 hours. HbA1C: No results for input(s): HGBA1C in the last 72 hours. CBG: No results for input(s): GLUCAP in the last 168 hours. Lipid Profile: No results for input(s): CHOL, HDL, LDLCALC, TRIG, CHOLHDL, LDLDIRECT in the last 72 hours. Thyroid Function Tests: Recent Labs    04/28/24 1538  FREET4 1.52   Anemia Panel: No results for input(s): VITAMINB12, FOLATE, FERRITIN, TIBC, IRON, RETICCTPCT in the last 72 hours. Urine analysis:    Component Value Date/Time   COLORURINE YELLOW 04/28/2024 1610   APPEARANCEUR CLEAR 04/28/2024 1610   LABSPEC 1.015 04/28/2024 1610   PHURINE 5.5 04/28/2024 1610   GLUCOSEU NEGATIVE 04/28/2024 1610   HGBUR SMALL (A) 04/28/2024 1610   BILIRUBINUR NEGATIVE 04/28/2024 1610   KETONESUR NEGATIVE 04/28/2024 1610   PROTEINUR 30 (A) 04/28/2024 1610   NITRITE NEGATIVE 04/28/2024 1610   LEUKOCYTESUR NEGATIVE 04/28/2024 1610    Radiological Exams on Admission: CT ABDOMEN PELVIS WO CONTRAST Result Date: 04/28/2024 EXAM: CT ABDOMEN AND PELVIS WITHOUT CONTRAST 04/28/2024 07:00:55 PM TECHNIQUE: CT of the abdomen and pelvis was performed without the administration of intravenous contrast. Multiplanar reformatted images are provided for review. Automated exposure control, iterative reconstruction, and/or weight-based adjustment of the mA/kV was utilized to reduce the radiation dose to as low as reasonably achievable. COMPARISON: CT abdomen and pelvis 09/15/2020. CLINICAL HISTORY: aki FINDINGS: LOWER CHEST: No acute abnormality. LIVER: The liver is unremarkable. GALLBLADDER AND BILE DUCTS: Gallbladder is unremarkable. No biliary ductal dilatation. SPLEEN: No acute abnormality.  PANCREAS: No acute abnormality. ADRENAL GLANDS: No acute abnormality. KIDNEYS, URETERS AND BLADDER: There is mild diffuse renal atrophy bilaterally. There is a single punctate calculus in the right kidney. No stones in the ureters. No hydronephrosis. No perinephric or periureteral stranding. The bladder is decompressed by Foley catheter. GI AND BOWEL: Stomach demonstrates no acute abnormality. There is sigmoid and descending colon diverticulosis. The appendix is within normal limits. There is a large amount of stool in the rectum. There is no bowel obstruction. PERITONEUM AND RETROPERITONEUM: No ascites. No free air. VASCULATURE: Aorta is normal in caliber. There are atherosclerotic calcifications of the aorta. LYMPH NODES: No lymphadenopathy. REPRODUCTIVE ORGANS: No  acute abnormality. BONES AND SOFT TISSUES: No acute osseous abnormality. No focal soft tissue abnormality. IMPRESSION: 1. Mild diffuse renal atrophy bilaterally with a single punctate calculus in the right kidney. 2. Sigmoid and descending colon diverticulosis without evidence of diverticulitis. 3. Large amount of stool in the rectum. Electronically signed by: Greig Pique MD 04/28/2024 07:10 PM EST RP Workstation: [id number]   DG Chest 1 View Result Date: 04/28/2024 CLINICAL DATA:  Weakness. EXAM: CHEST  1 VIEW COMPARISON:  Chest radiograph dated 07/16/2018. FINDINGS: Faint interstitial densities may be chronic or atypical infiltrate. No consolidative changes. There is no pleural effusion or pneumothorax. Mild cardiomegaly. No acute osseous pathology. IMPRESSION: Chronic interstitial coarsening versus atypical infiltrate. No consolidative changes. Electronically Signed   By: Vanetta Chou M.D.   On: 04/28/2024 18:18    EKG: Independently reviewed.  NSR with no acute ST-T wave changes.  Assessment/Plan Principal Problem:   ESRD (end stage renal disease) (HCC)    Generalized weakness secondary to CKD stage V now progressing to  ESRD/metabolic acidosis/hypotension/hypernatremia/hypocalcemia: Patient seen by nephrology, plan for tunneled catheter tomorrow and dialysis tomorrow since he is hemodynamically stable currently and electrolytes are fairly stable as well.  Will hold home antihypertensives due to hypotension, it appears that patient is on amlodipine , Coreg  and doxazosin  at home.  Appreciate nephrology help.  Chronic thrombocytopenia: Slightly lower platelets than his baseline but no bleeding.  Monitor closely.  Anemia of chronic disease: Baseline hemoglobin appears to be between 9-10, currently 8.3, likely secondary to progressive CKD.  No indication of blood transfusion.  Will defer to nephrology for Epogen .  ANCA positive necrotizing crescentic GN: Resume home dose of Imuran  and prednisone .  DVT prophylaxis: Heparin  Code Status: Full code Family Communication: None present at bedside.  Plan of care discussed with patient in length and he verbalized understanding and agreed with it. Disposition Plan: Will need to be in the hospital for couple of days. Consults called: Nephrology  Fredia Skeeter MD [facility]  *Please note that this is a verbal dictation therefore any spelling or grammatical errors are due to the Dragon Medical One system interpretation.  Please page via Amion and do not message via secure chat for urgent patient care matters. Secure chat can be used for non urgent patient care matters. 04/28/2024, 8:47 PM  To contact the attending provider between 7A-7P or the covering provider during after hours 7P-7A, please log into the web site [email or web address]     [1]  Allergies Allergen Reactions   Hydralazine  Hcl     Almost killed him per wife

## 2024-04-28 NOTE — ED Notes (Signed)
 Transitioned patient to hospital bed for comfort.

## 2024-04-28 NOTE — Consult Note (Signed)
 [facility] Renal Consultation Note  Requesting MD: Lynwood Moulder, MD Indication for Consultation:  advanced renal failure - need for HD   Chief complaint: weakness   HPI:  Jeremy Grant is a 63 y.o. male with a history of chronic kidney disease stage V, hypertension, nonischemic cardiomyopathy systolic and diastolic CHF who presented to the hospital with profound weakness.  He follows with Dr. Gearline for CKD stage V.  Per chart review, the patient's wife had just called the office expressing concern about his weakness and fatigue.  He has had nausea and decreased PO intake.  He presented to the ER and was found to have a creatinine of 10.6 and BUN of 217 on triage labs today.  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  He required dialysis for a short time and was thankfully able to come off of dialysis in 2020.  He has had slowly progressive worsening since.  He was last seen on 02/26/2024 in our office with a creatinine of 5.81, BUN 112, GFR 10, and K 4.3.  Per charting, if flares again Dr. Gearline would advocate for Rituxan and avacopan per her note.  Note 11/28/23 labs with Cr 4.69, BUN 88, and eGFR 13.  He has been on pred 5 mg daily and azathioprine  50 mg daily.  Nephrology is consulted for assistance with management of advanced CKD and concern for need for HD.  He received a liter of normal saline for hypotension.  We requested a CT a/p without contrast as well as a bladder scan.  CT was without hydronephrosis and bladder was noted to be decompressed by foley catheter.  He and I discussed the risks/benefits/indications for dialysis and he does agree to start.  He is not aware of any mental status changes.  I wasn't able to locate the patient's wife in the waiting area.  I called her via phone.  She provided additional history.  She and I discussed the risks/benefits/indications for dialysis, as well, and she does  consent to dialysis initiation. He doesn't have an AVF in place.  [facility] is very close to his home.        Creatinine, Ser  Date/Time Value Ref Range Status  04/28/2024 03:38 PM 10.60 (H) 0.61 - 1.24 mg/dL Final  87/72/7975 94:65 AM 3.91 (H) 0.61 - 1.24 mg/dL Final  87/73/7975 95:68 AM 3.95 (H) 0.61 - 1.24 mg/dL Final  87/74/7975 95:72 AM 4.33 (H) 0.61 - 1.24 mg/dL Final  87/75/7975 94:59 AM 4.71 (H) 0.61 - 1.24 mg/dL Final  87/76/7975 94:48 AM 4.90 (H) 0.61 - 1.24 mg/dL Final  87/77/7975 94:51 AM 4.96 (H) 0.61 - 1.24 mg/dL Final  87/78/7975 95:70 AM 4.76 (H) 0.61 - 1.24 mg/dL Final  94/93/7975 92:85 AM 3.70 (H) 0.61 - 1.24 mg/dL Final  98/84/7975 90:83 AM 3.97 (H) 0.76 - 1.27 mg/dL Final  90/78/7979 94:62 AM 2.73 (H) 0.61 - 1.24 mg/dL Final  90/79/7979 94:53 AM 2.61 (H) 0.61 - 1.24 mg/dL Final  90/82/7979 93:63 AM 2.84 (H) 0.61 - 1.24 mg/dL Final  90/85/7979 92:54 AM 2.85 (H) 0.61 - 1.24 mg/dL Final  90/89/7979 94:83 AM 3.28 (H) 0.61 - 1.24 mg/dL Final  90/92/7979 94:91 AM 2.82 (H) 0.61 - 1.24 mg/dL Final  90/95/7979 94:88 AM 3.03 (H) 0.61 - 1.24 mg/dL Final  90/96/7979 91:88 PM 3.17 (H) 0.61 - 1.24 mg/dL Final  90/98/7979 96:96 AM 2.95 (H) 0.61 - 1.24 mg/dL Final  91/68/7979  04:22 PM 2.93 (H) 0.61 - 1.24 mg/dL Final  91/68/7979 90:71 AM 2.95 (H) 0.61 - 1.24 mg/dL Final  96/76/7979 96:94 AM 6.87 (H) 0.61 - 1.24 mg/dL Final  96/77/7979 95:57 AM 6.65 (H) 0.61 - 1.24 mg/dL Final  96/78/7979 96:59 AM 6.91 (H) 0.61 - 1.24 mg/dL Final  96/79/7979 94:47 AM 6.78 (H) 0.61 - 1.24 mg/dL Final  96/80/7979 94:62 AM 5.35 (H) 0.61 - 1.24 mg/dL Final  96/81/7979 94:75 AM 6.91 (H) 0.61 - 1.24 mg/dL Final  96/82/7979 95:68 AM 6.89 (H) 0.61 - 1.24 mg/dL Final  96/83/7979 90:51 AM 6.26 (H) 0.61 - 1.24 mg/dL Final  96/84/7979 96:82 AM 5.92 (H) 0.61 - 1.24 mg/dL Final  96/85/7979 92:47 AM 5.87 (H) 0.61 - 1.24 mg/dL Final  96/86/7979 95:78 AM 6.00 (H) 0.61 - 1.24 mg/dL Final  96/87/7979 95:67  AM 6.12 (H) 0.61 - 1.24 mg/dL Final  96/88/7979 96:46 AM 6.25 (H) 0.61 - 1.24 mg/dL Final  96/89/7979 95:79 AM 6.38 (H) 0.61 - 1.24 mg/dL Final  96/90/7979 94:59 AM 6.32 (H) 0.61 - 1.24 mg/dL Final  96/91/7979 94:66 AM 5.73 (H) 0.61 - 1.24 mg/dL Final  96/92/7979 95:44 AM 6.04 (H) 0.61 - 1.24 mg/dL Final  96/93/7979 95:46 AM 6.30 (H) 0.61 - 1.24 mg/dL Final  96/94/7979 94:60 AM 5.94 (H) 0.61 - 1.24 mg/dL Corrected  96/95/7979 93:63 PM 6.23 (H) 0.61 - 1.24 mg/dL Final  87/94/7982 91:92 AM 1.32 (H) 0.61 - 1.24 mg/dL Final  88/78/7982 90:50 AM 1.34 (H) 0.61 - 1.24 mg/dL Final  90/96/7984 90:69 AM 1.70 (H) 0.50 - 1.35 mg/dL Final  90/98/7984 88:73 AM 1.90 (H) 0.50 - 1.35 mg/dL Final  93/73/7984 95:54 AM 1.34 0.50 - 1.35 mg/dL Final  93/74/7984 98:79 PM 1.15 0.50 - 1.35 mg/dL Final  93/74/7984 96:85 AM 1.46 (H) 0.50 - 1.35 mg/dL Final  93/75/7984 97:54 AM 1.31 0.50 - 1.35 mg/dL Final  93/76/7984 96:67 AM 1.31 0.50 - 1.35 mg/dL Final  93/77/7984 94:59 PM 1.20 0.50 - 1.35 mg/dL Final     PMHx:   Past Medical History:  Diagnosis Date   Arthritis    CHF (congestive heart failure) (HCC)    Combined systolic and diastolic cardiac dysfunction    Echo 11/03/2013 EF 20%, grade 3 diastolic dysfunction   Hypertension    NICM (nonischemic cardiomyopathy) (HCC)    a. L/RHC (11/05/13): RA: 3, RV 52/5, PA 49/19 (31), PCWP 10, AO 166/93, PA 67%, Fick CO/CI: 5.71/2.97, Lmain: normal, LAD: large, without signficant dz, first diagonal has 20% dz at ostium, LCx: normal, RCA: 30% stenosis at the bifurcation of PDA and PLOM   Stroke [facility])     Past Surgical History:  Procedure Laterality Date   BIOPSY  01/31/2019   Procedure: BIOPSY;  Surgeon: Elicia Claw, MD;  Location: [facility] ENDOSCOPY;  Service: Gastroenterology;;   CATARACT EXTRACTION Bilateral 05/2021   ESOPHAGOGASTRODUODENOSCOPY N/A 01/31/2019   Procedure: ESOPHAGOGASTRODUODENOSCOPY (EGD);  Surgeon: Elicia Claw, MD;  Location: [facility] ENDOSCOPY;   Service: Gastroenterology;  Laterality: N/A;   I & D EXTREMITY Left 05/05/2023   Procedure: IRRIGATION AND DEBRIDEMENT LEFT CALF;  Surgeon: Harden Jerona GAILS, MD;  Location: [facility] OR;  Service: Orthopedics;  Laterality: Left;   I & D EXTREMITY Left 05/09/2023   Procedure: IRRIGATION AND DEBRIDEMENT OF LEG;  Surgeon: Georgina Ozell LABOR, MD;  Location: [facility] OR;  Service: Orthopedics;  Laterality: Left;   IR FLUORO GUIDE CV LINE RIGHT  07/25/2018   IR US  GUIDE VASC ACCESS RIGHT  07/25/2018  LEFT AND RIGHT HEART CATHETERIZATION WITH CORONARY ANGIOGRAM N/A 11/05/2013   Procedure: LEFT AND RIGHT HEART CATHETERIZATION WITH CORONARY ANGIOGRAM;  Surgeon: Peter M Jordan, MD;  Location: [facility] CATH LAB;  Service: Cardiovascular;  Laterality: N/A;   RIGHT HEART CATHETERIZATION N/A 01/14/2014   Procedure: RIGHT HEART CATH;  Surgeon: Ezra GORMAN Shuck, MD;  Location: [facility] CATH LAB;  Service: Cardiovascular;  Laterality: N/A;    Family Hx:  Family History  Problem Relation Age of Onset   Heart attack Father 48   Heart disease Father    Arrhythmia Father    Hypertension Brother    Hypertension Brother     Social History:  reports that he has never smoked. He has never used smokeless tobacco. He reports current alcohol use of about 3.0 standard drinks of alcohol per week. He reports that he does not use drugs.  Allergies: Allergies[1]  Medications: Prior to Admission medications  Medication Sig Start Date End Date Taking? Authorizing Provider  acetaminophen  (TYLENOL ) 325 MG tablet Take 2 tablets (650 mg total) by mouth every 6 (six) hours as needed for moderate pain. 02/03/19   Angiulli, Toribio PARAS, PA-C  amLODipine  (NORVASC ) 10 MG tablet Take 1 tablet (10 mg total) by mouth daily. 02/04/19   Angiulli, Toribio PARAS, PA-C  aspirin  EC 81 MG EC tablet Take 1 tablet (81 mg total) by mouth daily. 02/04/19   Angiulli, Toribio PARAS, PA-C  atorvastatin  (LIPITOR ) 80 MG tablet Take 1 tablet (80 mg total) by mouth daily at 6 PM. 02/17/19  05/03/24  Debby Fidela CROME, NP  azaTHIOprine  (IMURAN ) 50 MG tablet Take 1 tablet (50 mg total) by mouth daily. 02/04/19   Angiulli, Toribio PARAS, PA-C  carvedilol  (COREG ) 12.5 MG tablet Take 12.5 mg by mouth 2 (two) times daily with a meal.    [provider]  doxazosin  (CARDURA ) 8 MG tablet 8 mg 2 (two) times daily. Increased to 4 mg 2x/day per pt conversation with nephrologist    [provider]  HYDROmorphone  (DILAUDID ) 2 MG tablet Take 0.5 tablets (1 mg total) by mouth every 6 (six) hours as needed for up to 12 doses for severe pain (pain score 7-10). 05/10/23   Eben Reyes BROCKS, MD  oxyCODONE -acetaminophen  (PERCOCET) 10-325 MG tablet Take 1 tablet by mouth every 6 (six) hours as needed for pain. 05/20/23   Harden Jerona GAILS, MD  polyethylene glycol powder (GLYCOLAX /MIRALAX ) 17 GM/SCOOP powder Take 1 capful (17 g) by mouth daily. 05/10/23   Arellano Zameza, Priscila, MD  predniSONE  (DELTASONE ) 5 MG tablet Take 5 mg by mouth daily with breakfast.    [provider]  tiZANidine  (ZANAFLEX ) 4 MG tablet Take 1 tablet (4 mg total) by mouth every 8 (eight) hours as needed for muscle spasms. 05/28/22   Whitfield Raisin, NP   I have reviewed the patient's current and reported prior to admission medications.  Labs:     Latest Ref Rng & Units 04/28/2024    3:38 PM 05/10/2023    5:34 AM 05/09/2023    4:31 AM  BMP  Glucose 70 - 99 mg/dL 838  898  865   BUN 8 - 23 mg/dL 782  79  86   Creatinine 0.61 - 1.24 mg/dL 89.39  6.08  6.04   Sodium 135 - 145 mmol/L 129  141  142   Potassium 3.5 - 5.1 mmol/L 3.6  3.8  3.4   Chloride 98 - 111 mmol/L 87  108  108   CO2 22 -  32 mmol/L 10  21  23    Calcium  8.9 - 10.3 mg/dL 6.5  8.1  8.7     Urinalysis    Component Value Date/Time   COLORURINE YELLOW 04/28/2024 1610   APPEARANCEUR CLEAR 04/28/2024 1610   LABSPEC 1.015 04/28/2024 1610   PHURINE 5.5 04/28/2024 1610   GLUCOSEU NEGATIVE 04/28/2024 1610   HGBUR SMALL (A) 04/28/2024 1610    BILIRUBINUR NEGATIVE 04/28/2024 1610   KETONESUR NEGATIVE 04/28/2024 1610   PROTEINUR 30 (A) 04/28/2024 1610   NITRITE NEGATIVE 04/28/2024 1610   LEUKOCYTESUR NEGATIVE 04/28/2024 1610     ROS:  Pertinent items noted in HPI and remainder of comprehensive ROS otherwise negative.  Physical Exam: Vitals:   04/28/24 1543  BP: (!) 98/59  Pulse: 70  Resp: 15  Temp: (!) 97.5 F (36.4 C)  SpO2: 100%     General: adult male in stretcher, chronically ill-appearing  HEENT: NCAT Eyes: EOMI sclera anicteric Neck: supple trachea midline  Heart: S1S2 no rub Lungs: clear to auscultation bilaterally; normal work of breathing on room air Abdomen:soft/nt/nd Extremities: no pitting edema; no cyanosis or clubbing Skin: left lower leg wrapped; no rash on extremities exposed Neuro: alert and oriented x 3 provides hx and follows commands Psych: normal mood and affect   Assessment/Plan:   # CKD stage V with progression to ESRD - HD in AM after tunn catheter placement with IR  - NPO after midnight tonight  - Gentle fluids as tolerated   - Renal panel in AM  - Will need to CLIP for outpatient HD unit placement   # Metabolic acidosis - LR at 75 ml/hr x 12 hours - Starting HD as above   # Hypotension  - hold home anti-hypertensives  - agree with gentle fluids   # Weakness - May be secondary to uremia as well as hypocalcemia and metabolic acidosis - hopeful for improvement with HD initiation   # Hypocalcemia  - Secondary to advanced CKD  - calcium  1 gram once   - check intact PTH in AM   # Hyponatremia  - Secondary to impaired free water excretion  - renal panel in AM  - Renal panel in AM    # Thrombocytopenia - CBC in AM  - Interfering substance called on CBC - perhaps the BUN?  Recommend admission and inpatient monitoring, HD initiation    Chord Takahashi C Kamrynn Melott 04/28/2024, 8:42 PM         [1]  Allergies Allergen Reactions   Hydralazine  Hcl     Almost killed him per  wife

## 2024-04-28 NOTE — ED Provider Triage Note (Addendum)
 Emergency Medicine Provider Triage Evaluation Note  Jeremy Grant , a 63 y.o. male  was evaluated in triage.  Pt complains of weakness and generalized fatigue in the setting of nausea and vomiting and decreased p.o. intake.  The patient states that he is worried about his kidney function.  He has a history of dialysis but is no longer on dialysis.  Denies any history of renal transplant..  Symptoms been ongoing for the past week.  He has had decreased appetite.  Review of Systems  Positive: Fatigue, weakness Negative: CP,SOB, Abd Pain  Physical Exam  BP (!) 98/59 (BP Location: Left Arm)   Pulse 70   Temp (!) 97.5 F (36.4 C)   Resp 15   SpO2 100%  Gen:   Awake, no distress   Resp:  Normal effort  MSK:   Moves extremities without difficulty  Abd:  Soft, nontender  Medical Decision Making  Medically screening exam initiated at 4:10 PM.  Appropriate orders placed.  SOHAIL CAPRARO was informed that the remainder of the evaluation will be completed by another provider, this initial triage assessment does not replace that evaluation, and the importance of remaining in the ED until their evaluation is complete.  Laboratory workup initiated from triage.   Jerrol Agent, MD 04/28/24 1623    Jerrol Agent, MD 04/28/24 [phone]

## 2024-04-28 NOTE — ED Triage Notes (Signed)
 BIB EMS from home with weaknes and fatigue x 1 week. HX of kidney failure. Was on dialysis and about 5 years ago. Decreased appetitie. Nausea no vomiting.   EMS vs 122/60 69 hr 18 rr 100% ra 158 cbg

## 2024-04-29 ENCOUNTER — Inpatient Hospital Stay (HOSPITAL_COMMUNITY)

## 2024-04-29 HISTORY — PX: IR TUNNELED CENTRAL VENOUS CATH PLC W IMG: IMG1939

## 2024-04-29 LAB — CBC
HCT: 26.1 % — ABNORMAL LOW (ref 39.0–52.0)
Hemoglobin: 9.5 g/dL — ABNORMAL LOW (ref 13.0–17.0)
MCH: 30.2 pg (ref 26.0–34.0)
MCHC: 36.4 g/dL — ABNORMAL HIGH (ref 30.0–36.0)
MCV: 82.9 fL (ref 80.0–100.0)
Platelets: 73 K/uL — ABNORMAL LOW (ref 150–400)
RBC: 3.15 MIL/uL — ABNORMAL LOW (ref 4.22–5.81)
RDW: 13.1 % (ref 11.5–15.5)
WBC: 7.1 K/uL (ref 4.0–10.5)
nRBC: 0 % (ref 0.0–0.2)

## 2024-04-29 LAB — RENAL FUNCTION PANEL
Albumin: 4 g/dL (ref 3.5–5.0)
Anion gap: 33 — ABNORMAL HIGH (ref 5–15)
BUN: 217 mg/dL — ABNORMAL HIGH (ref 8–23)
CO2: 7 mmol/L — ABNORMAL LOW (ref 22–32)
Calcium: 6.4 mg/dL — CL (ref 8.9–10.3)
Chloride: 91 mmol/L — ABNORMAL LOW (ref 98–111)
Creatinine, Ser: 10.4 mg/dL — ABNORMAL HIGH (ref 0.61–1.24)
GFR, Estimated: 5 mL/min — ABNORMAL LOW (ref >60)
Glucose, Bld: 97 mg/dL (ref 70–99)
Phosphorus: 14.5 mg/dL — ABNORMAL HIGH (ref 2.5–4.6)
Potassium: 3.8 mmol/L (ref 3.5–5.1)
Sodium: 131 mmol/L — ABNORMAL LOW (ref 135–145)

## 2024-04-29 LAB — TSH: TSH: 1.78 u[IU]/mL (ref 0.350–4.500)

## 2024-04-29 LAB — HEPATITIS B SURFACE ANTIGEN: Hepatitis B Surface Ag: NONREACTIVE

## 2024-04-29 MED ORDER — ALTEPLASE 2 MG IJ SOLR: 2.0000 mg | Freq: Once | INTRAMUSCULAR | Status: DC | PRN

## 2024-04-29 MED ORDER — CEFAZOLIN SODIUM-DEXTROSE 2-4 GM/100ML-% IV SOLN
INTRAVENOUS | Status: AC
  Filled 2024-04-29: qty 100

## 2024-04-29 MED ORDER — MIDAZOLAM HCL 2 MG/2ML IJ SOLN
INTRAMUSCULAR | Status: AC
  Filled 2024-04-29: qty 2

## 2024-04-29 MED ORDER — ANTICOAGULANT SODIUM CITRATE 4% (200MG/5ML) IV SOLN: 5.0000 mL | Status: DC | PRN

## 2024-04-29 MED ORDER — HEPARIN SODIUM (PORCINE) 1000 UNIT/ML DIALYSIS
1000.0000 [IU] | INTRAMUSCULAR | Status: DC | PRN
  Administered 2024-04-29: 17:00:00 1000 [IU]

## 2024-04-29 MED ORDER — HEPARIN SODIUM (PORCINE) 1000 UNIT/ML IJ SOLN
INTRAMUSCULAR | Status: AC
  Filled 2024-04-29: qty 4

## 2024-04-29 MED ORDER — LIDOCAINE-PRILOCAINE 2.5-2.5 % EX CREA: 1.0000 | TOPICAL_CREAM | CUTANEOUS | Status: DC | PRN

## 2024-04-29 MED ORDER — FENTANYL CITRATE (PF) 100 MCG/2ML IJ SOLN
INTRAMUSCULAR | Status: AC | PRN
  Administered 2024-04-29: 10:00:00 25 ug via INTRAVENOUS

## 2024-04-29 MED ORDER — HEPARIN SODIUM (PORCINE) 1000 UNIT/ML IJ SOLN
10.0000 mL | Freq: Once | INTRAMUSCULAR | Status: AC
  Administered 2024-04-29: 10:00:00 3.2 mL

## 2024-04-29 MED ORDER — MIDAZOLAM HCL (PF) 2 MG/2ML IJ SOLN
INTRAMUSCULAR | Status: AC | PRN
  Administered 2024-04-29: 10:00:00 .5 mg via INTRAVENOUS

## 2024-04-29 MED ORDER — FENTANYL CITRATE (PF) 100 MCG/2ML IJ SOLN
INTRAMUSCULAR | Status: AC
  Filled 2024-04-29: qty 2

## 2024-04-29 MED ORDER — LIDOCAINE HCL (PF) 1 % IJ SOLN: 5.0000 mL | INTRAMUSCULAR | Status: DC | PRN

## 2024-04-29 MED ORDER — LIDOCAINE HCL 1 % IJ SOLN
20.0000 mL | Freq: Once | INTRAMUSCULAR | Status: AC
  Administered 2024-04-29: 10:00:00 20 mL

## 2024-04-29 MED ORDER — CEFAZOLIN SODIUM-DEXTROSE 2-4 GM/100ML-% IV SOLN
2.0000 g | INTRAVENOUS | Status: AC
  Administered 2024-04-29: 10:00:00 2 g via INTRAVENOUS

## 2024-04-29 MED ORDER — PENTAFLUOROPROP-TETRAFLUOROETH EX AERO: 1.0000 | INHALATION_SPRAY | CUTANEOUS | Status: DC | PRN

## 2024-04-29 MED FILL — Lidocaine HCl Local Inj 1%: INTRAMUSCULAR | Qty: 20 | Status: AC

## 2024-04-29 MED FILL — Gelatin Absorbable Sponge 12-7 MM: CUTANEOUS | Qty: 1 | Status: AC

## 2024-04-29 MED FILL — Heparin Sodium (Porcine) Inj 1000 Unit/ML: INTRAMUSCULAR | Qty: 10 | Status: AC

## 2024-04-29 NOTE — Consult Note (Signed)
 Chief Complaint: Patient was seen in consultation today for  Chief Complaint  Patient presents with   Weakness   Referring Physician(s): Dr. Katheryn Saba  Supervising Physician: Philip Cornet  Patient Status: [facility] - ED  History of Present Illness: Jeremy Grant is a 64 y.o. male with a medical history significant for HTN, nonischemic cardiomyopathy, CHF, chronic anemia and thrombocytopenia and chronic kidney disease Stage V. He has a history of ANCA positive necrotizing crescentic GN and required hemodialysis for a short period of time. He last required hemodialysis in 2020. The patient is familiar to IR from several procedures over the past few years including a bone marrow biopsy and dialysis catheter placement in 2020 and a random renal biopsy in 2024.   He presented to the [facility] ED with profound weakness and hypotension. His creatinine was 10.6 (5.81 two months ago) and Nephrology was consulted. A CT abdomen/pelvis showed diverticulitis and diffuse renal atrophy bilaterally. Nephrology recommended hemodialysis and hospital admission.   Interventional Radiology has been asked to evaluate this patient for an image-guided tunneled hemodialysis catheter.   Past Medical History:  Diagnosis Date   Arthritis    CHF (congestive heart failure) (HCC)    Combined systolic and diastolic cardiac dysfunction    Echo 11/03/2013 EF 20%, grade 3 diastolic dysfunction   Hypertension    NICM (nonischemic cardiomyopathy) (HCC)    a. L/RHC (11/05/13): RA: 3, RV 52/5, PA 49/19 (31), PCWP 10, AO 166/93, PA 67%, Fick CO/CI: 5.71/2.97, Lmain: normal, LAD: large, without signficant dz, first diagonal has 20% dz at ostium, LCx: normal, RCA: 30% stenosis at the bifurcation of PDA and PLOM   Stroke [facility])     Past Surgical History:  Procedure Laterality Date   BIOPSY  01/31/2019   Procedure: BIOPSY;  Surgeon: Elicia Claw, MD;  Location: [facility] ENDOSCOPY;  Service: Gastroenterology;;   CATARACT  EXTRACTION Bilateral 05/2021   ESOPHAGOGASTRODUODENOSCOPY N/A 01/31/2019   Procedure: ESOPHAGOGASTRODUODENOSCOPY (EGD);  Surgeon: Elicia Claw, MD;  Location: [facility] ENDOSCOPY;  Service: Gastroenterology;  Laterality: N/A;   I & D EXTREMITY Left 05/05/2023   Procedure: IRRIGATION AND DEBRIDEMENT LEFT CALF;  Surgeon: Harden Jerona GAILS, MD;  Location: [facility] OR;  Service: Orthopedics;  Laterality: Left;   I & D EXTREMITY Left 05/09/2023   Procedure: IRRIGATION AND DEBRIDEMENT OF LEG;  Surgeon: Georgina Ozell LABOR, MD;  Location: [facility] OR;  Service: Orthopedics;  Laterality: Left;   IR FLUORO GUIDE CV LINE RIGHT  07/25/2018   IR US  GUIDE VASC ACCESS RIGHT  07/25/2018   LEFT AND RIGHT HEART CATHETERIZATION WITH CORONARY ANGIOGRAM N/A 11/05/2013   Procedure: LEFT AND RIGHT HEART CATHETERIZATION WITH CORONARY ANGIOGRAM;  Surgeon: Peter M Jordan, MD;  Location: [facility] CATH LAB;  Service: Cardiovascular;  Laterality: N/A;   RIGHT HEART CATHETERIZATION N/A 01/14/2014   Procedure: RIGHT HEART CATH;  Surgeon: Ezra GORMAN Shuck, MD;  Location: [facility] CATH LAB;  Service: Cardiovascular;  Laterality: N/A;    Allergies: Hydralazine  hcl  Medications: Prior to Admission medications  Medication Sig Start Date End Date Taking? Authorizing Provider  acetaminophen  (TYLENOL ) 325 MG tablet Take 2 tablets (650 mg total) by mouth every 6 (six) hours as needed for moderate pain. 02/03/19  Yes Angiulli, Toribio PARAS, PA-C  amLODipine  (NORVASC ) 10 MG tablet Take 1 tablet (10 mg total) by mouth daily. 02/04/19  Yes Angiulli, Toribio PARAS, PA-C  aspirin  EC 81 MG EC tablet Take 1 tablet (81 mg total) by mouth daily. 02/04/19  Yes Angiulli,  Toribio PARAS, PA-C  atorvastatin  (LIPITOR ) 80 MG tablet Take 1 tablet (80 mg total) by mouth daily at 6 PM. 02/17/19 05/03/24 Yes Debby Fidela CROME, NP  azaTHIOprine  (IMURAN ) 50 MG tablet Take 1 tablet (50 mg total) by mouth daily. 02/04/19  Yes Angiulli, Toribio PARAS, PA-C  carvedilol  (COREG ) 12.5 MG tablet Take 12.5 mg by  mouth 2 (two) times daily with a meal.   Yes [provider]  doxazosin  (CARDURA ) 8 MG tablet Take 8 mg by mouth 2 (two) times daily. Increased to 4 mg 2x/day per pt conversation with nephrologist   Yes [provider]  polyethylene glycol powder (GLYCOLAX /MIRALAX ) 17 GM/SCOOP powder Take 1 capful (17 g) by mouth daily. 05/10/23  Yes Arellano Zameza, Priscila, MD  predniSONE  (DELTASONE ) 5 MG tablet Take 5 mg by mouth daily with breakfast.   Yes [provider]  tiZANidine  (ZANAFLEX ) 4 MG tablet Take 1 tablet (4 mg total) by mouth every 8 (eight) hours as needed for muscle spasms. 05/28/22  Yes McCue, Harlene, NP  HYDROmorphone  (DILAUDID ) 2 MG tablet Take 0.5 tablets (1 mg total) by mouth every 6 (six) hours as needed for up to 12 doses for severe pain (pain score 7-10). Patient not taking: Reported on 04/28/2024 05/10/23   Eben Reyes BROCKS, MD  oxyCODONE -acetaminophen  (PERCOCET) 10-325 MG tablet Take 1 tablet by mouth every 6 (six) hours as needed for pain. Patient not taking: Reported on 04/28/2024 05/20/23   Harden Jerona GAILS, MD     Family History  Problem Relation Age of Onset   Heart attack Father 64   Heart disease Father    Arrhythmia Father    Hypertension Brother    Hypertension Brother     Social History   Socioeconomic History   Marital status: Married    Spouse name: BRENDA   Number of children: 0   Years of education: 12   Highest education level: 12th grade  Occupational History   Occupation: cabin crew  Tobacco Use   Smoking status: Never   Smokeless tobacco: Never  Substance and Sexual Activity   Alcohol use: Yes    Alcohol/week: 3.0 standard drinks of alcohol    Types: 3 Glasses of wine per week   Drug use: No   Sexual activity: Not Currently    Birth control/protection: None  Other Topics Concern   Not on file  Social History Narrative   Lives with wife at home with dog Abbie.     Social Drivers of Health   Tobacco Use: Low  Risk (04/28/2024)   Patient History    Smoking Tobacco Use: Never    Smokeless Tobacco Use: Never    Passive Exposure: Not on file  Financial Resource Strain: Not on file  Food Insecurity: No Food Insecurity (05/04/2023)   Hunger Vital Sign    Worried About Running Out of Food in the Last Year: Never true    Ran Out of Food in the Last Year: Never true  Transportation Needs: No Transportation Needs (05/04/2023)   PRAPARE - Administrator, Civil Service (Medical): No    Lack of Transportation (Non-Medical): No  Physical Activity: Not on file  Stress: Not on file  Social Connections: Not on file  Depression (EYV7-0): Not on file  Alcohol Screen: Not on file  Housing: Low Risk (05/04/2023)   Housing Stability Vital Sign    Unable to Pay for Housing in the Last Year: No    Number of Times Moved in  the Last Year: 0    Homeless in the Last Year: No  Utilities: Not At Risk (05/04/2023)   [employer] Utilities    Threatened with loss of utilities: No  Health Literacy: Not on file    Review of Systems: A 12 point ROS discussed and pertinent positives are indicated in the HPI above.  All other systems are negative.  Review of Systems  Constitutional:  Positive for fatigue.  Skin:  Positive for wound.       Left calf wound from car door accident. Site is wrapped in ace bandage.   Neurological:  Positive for weakness.  Hematological:  Bruises/bleeds easily.    Vital Signs: BP 133/70   Pulse 78   Temp (!) 97.5 F (36.4 C) (Oral)   Resp 16   SpO2 100%   Physical Exam Constitutional:      General: He is not in acute distress. HENT:     Mouth/Throat:     Mouth: Mucous membranes are moist.     Pharynx: Oropharynx is clear.  Cardiovascular:     Rate and Rhythm: Normal rate.  Pulmonary:     Effort: Pulmonary effort is normal.  Abdominal:     Tenderness: There is no abdominal tenderness.  Skin:    Findings: Bruising present.     Comments: Significant bruising to right  forearm. Left calf wound from car door injury.   Neurological:     Mental Status: He is alert and oriented to person, place, and time.      Labs:  CBC: Recent Labs    05/10/23 0534 04/28/24 1538 04/28/24 2156 04/29/24 0234  WBC 5.8 5.6 5.6 7.1  HGB 8.3* 14.0 10.4* 9.5*  HCT 24.4* RESULTS UNAVAILABLE DUE TO INTERFERING SUBSTANCE 28.0* 26.1*  PLT 137* 70* 75* 73*    COAGS: Recent Labs    05/04/23 0429 04/28/24 1538  INR 1.0 1.1  APTT 29  --     BMP: Recent Labs    05/09/23 0431 05/10/23 0534 04/28/24 1538 04/28/24 2156 04/29/24 0234  NA 142 141 129*  --  131*  K 3.4* 3.8 3.6  --  3.8  CL 108 108 87*  --  91*  CO2 23 21* 10*  --  7*  GLUCOSE 134* 101* 161*  --  97  BUN 86* 79* 217*  --  217*  CALCIUM  8.7* 8.1* 6.5*  --  6.4*  CREATININE 3.95* 3.91* 10.60* 10.40* 10.40*  GFRNONAA 16* 16* 5* 5* 5*    LIVER FUNCTION TESTS: Recent Labs    04/28/24 1538 04/29/24 0234  BILITOT 0.6  --   AST 20  --   ALT 20  --   ALKPHOS 77  --   PROT 7.1  --   ALBUMIN 4.4 4.0    TUMOR MARKERS: No results for input(s): AFPTM, CEA, CA199, CHROMGRNA in the last 8760 hours.  Assessment and Plan:  ESRD requiring hemodialysis: Jeremy Grant, 63 year old male, is tentatively scheduled today for an image-guided tunneled hemodialysis catheter placement.   Risks and benefits discussed with the patient including, but not limited to bleeding, infection, vascular injury, pneumothorax which may require chest tube placement, air embolism or even death  All of the patient's questions were answered, patient is agreeable to proceed. He has been NPO.   Consent signed and in IR.  Thank you for this interesting consult.  I greatly enjoyed meeting Jeremy Grant and look forward to participating in their care.  A copy of this  report was sent to the requesting provider on this date.  Electronically Signed: Warren Dais, AGACNP-BC 04/29/2024, 8:31 AM   I spent a  total of 20 Minutes    in face to face in clinical consultation, greater than 50% of which was counseling/coordinating care for ESRD

## 2024-04-29 NOTE — Progress Notes (Signed)
 PROGRESS NOTE    Jeremy Grant  FMW:[id number] DOB: 01/14/1961 DOA: 04/28/2024 PCP: Burney Darice CROME, MD    Brief Narrative:  63 y.o. male with medical history significant of chronic kidney disease stage V, hypertension, nonischemic cardiomyopathy systolic and diastolic CHF who presented to ED with significantly profound weakness which is ongoing for about 2 to 3 days.  He denied any other complaint such as nausea, vomiting, fever, chills, sweating, chest pain or shortness of breath.  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  He was last seen by outpatien nephrology on 02/26/2024 with a creatinine of 5.81, BUN 112, GFR 10, and K 4.3. He has been on prednisone  5 mg daily and azathioprine  50 mg daily.   In the ED, he was afebrile, HR 70, RR 15, BP 98/59, SpO2 100% on RA.  CBC results initially unclear due to interfering substance reported by lab, later repeated to show hemoglobin 10.4, platelets 75.  CMP showed sodium 129, potassium 3.6, chloride 87, BUN 217, creatinine 10.6, calcium  6.5, anion gap 32.  PT/INR within normal limits.  UA showed small Hgb and 30 protein.  TSH and FT4 within normal limits.  4 Plex PCR negative.  CXR unremarkable.  Nephrology was consulted recommended CT abdomen pelvis which showed diverticulosis and diffuse renal atrophy bilaterally and a single punctate calculus in the right kidney, large stool burden.  Nephrology recommended admission for initiation of hemodialysis.  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 - Patient with progression in renal failure with creatinine of 10.6, BUN 217 - Nephrology evaluated patient in ED, recommended admission for initiation of hemodialysis - IR consulted for tunneled HD catheter placement - HD per nephrology after [facility]  placement  #Hyperphosphatemia - Secondary to ESRD - Phosphorus 14.5 - Management with HD  #Metabolic acidosis - Likely secondary to ESRD - Receiving LR at 75 mL/h for 12 hours - Further management with HD  # Hypocalcemia - Secondary to ESRD - Received 1 g calcium   # Hyponatremia - Secondary to ESRD and impaired free water excretion - Improved to 131  #Anemia of chronic disease - Baseline hemoglobin 9-10 - Hgb stable at baseline  #Chronic thrombocytopenia - Platelets 73 - No active bleeding - Continue to monitor  # ANCA-positive necrotizing crescentic GN - Continue home Imuran  and prednisone  - Nephrology following  # Hypertension - Presented with borderline hypotension, currently normotensive - Home BP meds held  # Chronic left leg wound - Chronic, follows with outpatient wound care and Dr. Harden. Wife performs daily wound care at home.  - WOC consulted  # Generalized weakness - Likely secondary to uremia, hypocalcemia, and metabolic acidosis. Management as above - PT/OT consulted for evaluation   DVT prophylaxis: heparin  injection 5,000 Units Start: 04/28/24 2200   Code Status:   Code Status: Full Code  Family Communication: Discussed with wife at bedside  Disposition Plan: Pending clinical improvement after HD initiation and securing outpatient HD chair PT -   OT -    DME Needs:       Level of care: Telemetry  Consultants:  Nephrology  Procedures:  Tunneled hemodialysis catheter placement 12/17  Antimicrobials: None  Subjective: Patient examined bedside.  Reports doing fairly well.   Reports some nausea without vomiting, metallic taste. Denies any confusion, SOB,  chest pain, LE edema.   Objective: Vitals:   04/29/24 0201 04/29/24 0202 04/29/24 0400 04/29/24 0600  BP:  119/61 117/63 119/75  Pulse: 71 72 72 78  Resp: 15 15 16 16   Temp: 97.6 F (36.4 C)   98.2 F (36.8 C)  TempSrc: Oral   Oral  SpO2: 100% 100% 100% 100%     Intake/Output Summary (Last 24 hours) at 04/29/2024 0656 Last data filed at 04/29/2024 9378 Gross per 24 hour  Intake 640.96 ml  Output 950 ml  Net -309.04 ml   There were no vitals filed for this visit.  Examination:  Gen: NAD, A&Ox3 HEENT: NCAT, EOMI Neck: Supple CV: RRR, no murmurs Resp: normal WOB, CTAB, no w/r/r Abd: Soft, NTND Ext: No LE edema Skin: Warm, dry, left leg with ace bandage for chronic wound per wife Neuro: CN II-XII grossly intact, strength 5/5 b/l, sensation intact Psych: Calm, cooperative, appropriate affect   Data Reviewed: I have personally reviewed following labs and imaging studies  CBC: Recent Labs  Lab 04/28/24 1538 04/28/24 2156 04/29/24 0234  WBC 5.6 5.6 7.1  HGB 14.0 10.4* 9.5*  HCT RESULTS UNAVAILABLE DUE TO INTERFERING SUBSTANCE 28.0* 26.1*  MCV RESULTS UNAVAILABLE DUE TO INTERFERING SUBSTANCE 81.4 82.9  PLT 70* 75* 73*   Basic Metabolic Panel: Recent Labs  Lab 04/28/24 1538 04/28/24 2156 04/29/24 0234  NA 129*  --  131*  K 3.6  --  3.8  CL 87*  --  91*  CO2 10*  --  7*  GLUCOSE 161*  --  97  BUN 217*  --  217*  CREATININE 10.60* 10.40* 10.40*  CALCIUM  6.5*  --  6.4*  PHOS  --   --  14.5*   GFR: CrCl cannot be calculated (Unknown ideal weight.). Liver Function Tests: Recent Labs  Lab 04/28/24 1538 04/29/24 0234  AST 20  --   ALT 20  --   ALKPHOS 77  --   BILITOT 0.6  --   PROT 7.1  --   ALBUMIN 4.4 4.0   No results for input(s): LIPASE, AMYLASE in the last 168 hours. No results for input(s): AMMONIA in the last 168 hours. Coagulation Profile: Recent Labs  Lab 04/28/24 1538  INR 1.1   Cardiac Enzymes: No results for input(s): CKTOTAL, CKMB, CKMBINDEX, TROPONINI in the last 168 hours. BNP (last 3 results) No results for input(s): PROBNP in the last 8760 hours. HbA1C: No results for input(s): HGBA1C in the last 72 hours. CBG: No results for input(s): GLUCAP in the last 168  hours. Lipid Profile: No results for input(s): CHOL, HDL, LDLCALC, TRIG, CHOLHDL, LDLDIRECT in the last 72 hours. Thyroid Function Tests: Recent Labs    04/28/24 1538 04/28/24 2156 04/29/24 0234  TSH  --    < > 1.780  FREET4 1.52  --   --    < > = values in this interval not displayed.   Anemia Panel: No results for input(s): VITAMINB12, FOLATE, FERRITIN, TIBC, IRON, RETICCTPCT in the last 72 hours. Sepsis Labs: No results for input(s): PROCALCITON, LATICACIDVEN in the last 168 hours.  Recent Results (from the past 240 hours)  Resp panel by RT-PCR (RSV, Flu A&B, Covid) Anterior Nasal Swab     Status: None   Collection Time: 04/28/24  5:14 PM   Specimen: Anterior Nasal Swab  Result Value Ref Range Status   SARS Coronavirus 2 by RT PCR NEGATIVE NEGATIVE Final   Influenza A by PCR NEGATIVE NEGATIVE Final  Influenza B by PCR NEGATIVE NEGATIVE Final    Comment: (NOTE) The Xpert Xpress SARS-CoV-2/FLU/RSV plus assay is intended as an aid in the diagnosis of influenza from Nasopharyngeal swab specimens and should not be used as a sole basis for treatment. Nasal washings and aspirates are unacceptable for Xpert Xpress SARS-CoV-2/FLU/RSV testing.  Fact Sheet for Patients: bloggercourse.com  Fact Sheet for Healthcare Providers: seriousbroker.it  This test is not yet approved or cleared by the [place]  FDA and has been authorized for detection and/or diagnosis of SARS-CoV-2 by FDA under an Emergency Use Authorization (EUA). This EUA will remain in effect (meaning this test can be used) for the duration of the COVID-19 declaration under Section 564(b)(1) of the Act, 21 U.S.C. section 360bbb-3(b)(1), unless the authorization is terminated or revoked.     Resp Syncytial Virus by PCR NEGATIVE NEGATIVE Final    Comment: (NOTE) Fact Sheet for Patients: bloggercourse.com  Fact  Sheet for Healthcare Providers: seriousbroker.it  This test is not yet approved or cleared by the [place]  FDA and has been authorized for detection and/or diagnosis of SARS-CoV-2 by FDA under an Emergency Use Authorization (EUA). This EUA will remain in effect (meaning this test can be used) for the duration of the COVID-19 declaration under Section 564(b)(1) of the Act, 21 U.S.C. section 360bbb-3(b)(1), unless the authorization is terminated or revoked.  Performed at [facility], [address], [place], [place] [address]      Radiology Studies: CT ABDOMEN PELVIS WO CONTRAST Result Date: 04/28/2024 EXAM: CT ABDOMEN AND PELVIS WITHOUT CONTRAST 04/28/2024 07:00:55 PM TECHNIQUE: CT of the abdomen and pelvis was performed without the administration of intravenous contrast. Multiplanar reformatted images are provided for review. Automated exposure control, iterative reconstruction, and/or weight-based adjustment of the mA/kV was utilized to reduce the radiation dose to as low as reasonably achievable. COMPARISON: CT abdomen and pelvis 09/15/2020. CLINICAL HISTORY: aki FINDINGS: LOWER CHEST: No acute abnormality. LIVER: The liver is unremarkable. GALLBLADDER AND BILE DUCTS: Gallbladder is unremarkable. No biliary ductal dilatation. SPLEEN: No acute abnormality. PANCREAS: No acute abnormality. ADRENAL GLANDS: No acute abnormality. KIDNEYS, URETERS AND BLADDER: There is mild diffuse renal atrophy bilaterally. There is a single punctate calculus in the right kidney. No stones in the ureters. No hydronephrosis. No perinephric or periureteral stranding. The bladder is decompressed by Foley catheter. GI AND BOWEL: Stomach demonstrates no acute abnormality. There is sigmoid and descending colon diverticulosis. The appendix is within normal limits. There is a large amount of stool in the rectum. There is no bowel obstruction. PERITONEUM AND RETROPERITONEUM: No ascites. No  free air. VASCULATURE: Aorta is normal in caliber. There are atherosclerotic calcifications of the aorta. LYMPH NODES: No lymphadenopathy. REPRODUCTIVE ORGANS: No acute abnormality. BONES AND SOFT TISSUES: No acute osseous abnormality. No focal soft tissue abnormality. IMPRESSION: 1. Mild diffuse renal atrophy bilaterally with a single punctate calculus in the right kidney. 2. Sigmoid and descending colon diverticulosis without evidence of diverticulitis. 3. Large amount of stool in the rectum. Electronically signed by: Greig Pique MD 04/28/2024 07:10 PM EST RP Workstation: [id number]   DG Chest 1 View Result Date: 04/28/2024 CLINICAL DATA:  Weakness. EXAM: CHEST  1 VIEW COMPARISON:  Chest radiograph dated 07/16/2018. FINDINGS: Faint interstitial densities may be chronic or atypical infiltrate. No consolidative changes. There is no pleural effusion or pneumothorax. Mild cardiomegaly. No acute osseous pathology. IMPRESSION: Chronic interstitial coarsening versus atypical infiltrate. No consolidative changes. Electronically Signed   By: Vanetta Shelia HERO.D.  On: 04/28/2024 18:18    Scheduled Meds:  aspirin  EC  81 mg Oral Daily   atorvastatin   80 mg Oral q1800   azaTHIOprine   50 mg Oral Daily   Chlorhexidine  Gluconate Cloth  6 each Topical Q0600   heparin   5,000 Units Subcutaneous Q8H   polyethylene glycol  17 g Oral Daily   predniSONE   5 mg Oral Q breakfast   Continuous Infusions:  lactated ringers  75 mL/hr at 04/29/24 9378     Unresulted Labs (From admission, onward)     Start     Ordered   04/29/24 0500  Parathyroid  hormone, intact (no Ca)  Tomorrow morning,   R        04/28/24 2039   04/28/24 2034  Hepatitis B surface antigen  (New Admission Hemo Labs (Hepatitis B))  Once,   URGENT        04/28/24 2034   04/28/24 2034  Hepatitis B surface antibody,quantitative  (New Admission Hemo Labs (Hepatitis B))  Once,   URGENT        04/28/24 2034             LOS:  LOS: 1 day   Time  Spent: 40 minutes  Jakyle Petrucelli Al-Sultani, MD [facility]  If 7PM-7AM, please contact night-coverage  04/29/2024, 6:56 AM

## 2024-04-29 NOTE — Progress Notes (Signed)
 Requested to see pt for out-pt HD needs at d/c. Spoke to pt's wife via phone. Introduced self and explained role. Pt prefers out-pt HD at [facility]. Pt has hx at this clinic and is closest to pt's home. Referral submitted to [facility] admissions today with request for [facility]. Pt's wife plans to transport pt to HD appts. Will assist as needed.   Randine Mungo Dialysis Navigator [phone]

## 2024-04-29 NOTE — Progress Notes (Signed)
 [facility] night cross cover note:   I have placed order to reconcile the foley catheter placed in ED this evening for bladder decompression.     Eva Pore, DO Hospitalist

## 2024-04-29 NOTE — Consult Note (Signed)
 WOC Nurse Consult Note: Reason for Consult: LLE wound Followed outpatient by Dr. Harden, seen last week 04/23/24 Wound type: initially trauma; now chronic related to venous stasis disease/CKD Pressure Injury POA: NA Measurement: noted last week to be 12cm x 6cm; see nursing flow sheets for updated measurements  Wound bed: see nursing flow sheets; nursing to take photo with skin assessment Drainage (amount, consistency, odor) see nursing flow sheets Periwound:brawny skin changes per Dr. Crist last note Dressing procedure/placement/frequency: Cleanse wound with Carolynn Soila [phone]). Apply Vashe moist gauze to wound bed, top with dry dressing, wrap with kerlix and ACE wrap.   Wife performs wound care at home, she may prefer to do while inpatient, check with patient and wife.   Follow up with Dr. Harden outpatient as scheduled.   Re consult if needed, will not follow at this time. Thanks  Manoj [employer], RN,CWOCN, CNS, [employer] [phone]

## 2024-04-29 NOTE — Progress Notes (Signed)
°   04/29/24 1706  Vitals  Temp 98.1 F (36.7 C)  Pulse Rate 80  Resp 14  BP (!) 147/81  SpO2 99 %  O2 Device Room Air  Weight (S)  62.6 kg (Bed Scale)  Type of Weight Post-Dialysis  Post Treatment  Dialyzer Clearance Clear  Hemodialysis Intake (mL) 0 mL  Liters Processed 24  Fluid Removed (mL) 0 mL  Post-Hemodialysis Comments Pt voice no complaints for his HD Tx #1. UF goal maintained without difficulties. Report call to ED RN.   Received patient in bed to unit.  Alert and oriented.  Informed consent signed and in chart.   TX duration: 2  Patient tolerated well.  Transported back to the room  Alert, without acute distress.  Hand-off given to patient's nurse.   Access used: Yes Access issues: No  Total UF removed: 0 Medication(s) given: See MAR Post HD VS: See Above Grid Post HD weight: 62.6 kg   Zebedee DELENA [facility]

## 2024-04-29 NOTE — Progress Notes (Signed)
 PT Cancellation Note  Patient Details Name: Jeremy Grant MRN: [id number] DOB: 09-Dec-1960   Cancelled Treatment:    Reason Eval/Treat Not Completed: Patient at procedure or test/unavailable. PT eval received, chart reviewed. Pt to received HD after [facility] placement this date. Will check back as schedule allows to initiate PT evaluation when pt is available and appropriate to participate.    [name] 04/29/2024, 2:07 PM  [name], PT, DPT Acute Rehabilitation Services Secure Chat Preferred Office: [phone]

## 2024-04-29 NOTE — Procedures (Signed)
 I was present at this dialysis session. I have reviewed the session itself and made appropriate changes.   Filed Weights   04/29/24 1402  Weight: (S) 62.8 kg    Recent Labs  Lab 04/29/24 0234  NA 131*  K 3.8  CL 91*  CO2 7*  GLUCOSE 97  BUN 217*  CREATININE 10.40*  CALCIUM  6.4*  PHOS 14.5*    Recent Labs  Lab 04/28/24 1538 04/28/24 2156 04/29/24 0234  WBC 5.6 5.6 7.1  HGB 14.0 10.4* 9.5*  HCT RESULTS UNAVAILABLE DUE TO INTERFERING SUBSTANCE 28.0* 26.1*  MCV RESULTS UNAVAILABLE DUE TO INTERFERING SUBSTANCE 81.4 82.9  PLT 70* 75* 73*    Scheduled Meds:  aspirin  EC  81 mg Oral Daily   atorvastatin   80 mg Oral q1800   azaTHIOprine   50 mg Oral Daily   Chlorhexidine  Gluconate Cloth  6 each Topical Q0600   heparin   5,000 Units Subcutaneous Q8H   polyethylene glycol  17 g Oral Daily   predniSONE   5 mg Oral Q breakfast   Continuous Infusions: PRN Meds:.acetaminophen  **OR** acetaminophen , ondansetron  **OR** ondansetron  (ZOFRAN ) IV, oxyCODONE -acetaminophen  **AND** oxyCODONE , tiZANidine    [name], MD [facility] 04/29/2024, 2:19 PM

## 2024-04-29 NOTE — Progress Notes (Signed)
 Jeremy Grant Progress Note    Assessment/ Plan:   # CKD stage V, now ESRD - s/p TDC placement 12/17 with IR. Slow start protocol. HD#1 today. #2 tomorrow 12/18 - CLIP in process   # Metabolic acidosis - LR at 75 ml/hr x 12 hours - Starting HD as above    # Hypotension  - improved   # Weakness - May be secondary to uremia as well as hypocalcemia and metabolic acidosis - hopeful for improvement with HD initiation    # Hypocalcemia  - Secondary to advanced CKD  - calcium  1 gram once   - PTH pending   # Hyponatremia  - Secondary to impaired free water excretion  - Na 131, will manage with HD   # Thrombocytopenia - CBC in AM  - Interfering substance called on CBC - perhaps the BUN?  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 A biopsy will not add much utility in his particular case given how diffusely atrophied his kidneys are on imaging. Also not much hematuria on urine sediment from 12/16    Subjective:   Patient seen and examined on HD. Still feels weak. S/p TDC with IR earlier today   Objective:   BP (!) 106/57   Pulse 70   Temp (!) 97.2 F (36.2 C) (Oral)   Resp 15   SpO2 99%   Intake/Output Summary (Last 24 hours) at 04/29/2024 1337 Last data filed at 04/29/2024 9378 Gross per 24 hour  Intake 640.96 ml  Output 950 ml  Net -309.04 ml   Weight change:   Physical Exam: Gen: tired appearing, NAD CVS: RRR Resp: CTA BL Abd: soft, nt/nd Ext: no edema Neuro: awake, alert Dialysis access: RIJ [facility]  Imaging: IR TUNNELED CENTRAL VENOUS CATH [facility] IMG Result Date: 04/29/2024 INDICATION: 63 year old with end-stage renal disease and needs hemodialysis. EXAM: FLUOROSCOPIC AND ULTRASOUND GUIDED PLACEMENT OF A TUNNELED DIALYSIS CATHETER Physician: Juliene SAUNDERS. Philip, MD MEDICATIONS: Ancef  2 g; The antibiotic was administered within an appropriate time interval prior to skin puncture. ANESTHESIA/SEDATION: Moderate (conscious)  sedation was employed during this procedure. A total of Versed  0.5 mg and fentanyl  25 mcg was administered intravenously at the order of the provider performing the procedure. Total intra-service moderate sedation time: 20 minutes. Patient's level of consciousness and vital signs were monitored continuously by radiology nurse throughout the procedure under the supervision of the provider performing the procedure. FLUOROSCOPY TIME:  Radiation Exposure Index (as provided by the fluoroscopic device): 1 mGy Kerma COMPLICATIONS: None immediate. PROCEDURE: Informed consent was obtained for placement of a tunneled dialysis catheter. The patient was placed supine on the interventional table. Ultrasound confirmed a patent right internal jugular vein. Ultrasound image obtained for documentation. The right neck and chest was prepped and draped in a sterile fashion. Maximal barrier sterile technique was utilized including caps, mask, sterile gowns, sterile gloves, sterile drape, hand hygiene and skin antiseptic. The right neck was anesthetized with 1% lidocaine . A small incision was made with #11 blade scalpel. A 21 gauge needle directed into the right internal jugular vein with ultrasound guidance. A micropuncture dilator set was placed. A 19 cm tip to cuff Palindrome catheter was selected. The skin below the right clavicle was anesthetized and a small incision was made with an #11 blade scalpel. A subcutaneous tunnel was formed to the vein dermatotomy site. The catheter was brought through the tunnel. The vein dermatotomy site was dilated to accommodate a peel-away sheath  over a wire. The catheter was placed through the peel-away sheath and directed into the central venous structures. The tip of the catheter was placed at superior cavoatrial junction with fluoroscopy. Fluoroscopic images were obtained for documentation. Both lumens were found to aspirate and flush well. The proper amount of heparin  was flushed in both lumens.  The vein dermatotomy site was closed using a single layer of absorbable suture and Dermabond. Gel-Foam placed in subcutaneous tract. The catheter was secured to the skin using Prolene suture. IMPRESSION: Successful placement of a right jugular tunneled dialysis catheter using ultrasound and fluoroscopic guidance. Electronically Signed   By: Juliene Balder M.D.   On: 04/29/2024 10:49   CT ABDOMEN PELVIS WO CONTRAST Result Date: 04/28/2024 EXAM: CT ABDOMEN AND PELVIS WITHOUT CONTRAST 04/28/2024 07:00:55 PM TECHNIQUE: CT of the abdomen and pelvis was performed without the administration of intravenous contrast. Multiplanar reformatted images are provided for review. Automated exposure control, iterative reconstruction, and/or weight-based adjustment of the mA/kV was utilized to reduce the radiation dose to as low as reasonably achievable. COMPARISON: CT abdomen and pelvis 09/15/2020. CLINICAL HISTORY: aki FINDINGS: LOWER CHEST: No acute abnormality. LIVER: The liver is unremarkable. GALLBLADDER AND BILE DUCTS: Gallbladder is unremarkable. No biliary ductal dilatation. SPLEEN: No acute abnormality. PANCREAS: No acute abnormality. ADRENAL GLANDS: No acute abnormality. KIDNEYS, URETERS AND BLADDER: There is mild diffuse renal atrophy bilaterally. There is a single punctate calculus in the right kidney. No stones in the ureters. No hydronephrosis. No perinephric or periureteral stranding. The bladder is decompressed by Foley catheter. GI AND BOWEL: Stomach demonstrates no acute abnormality. There is sigmoid and descending colon diverticulosis. The appendix is within normal limits. There is a large amount of stool in the rectum. There is no bowel obstruction. PERITONEUM AND RETROPERITONEUM: No ascites. No free air. VASCULATURE: Aorta is normal in caliber. There are atherosclerotic calcifications of the aorta. LYMPH NODES: No lymphadenopathy. REPRODUCTIVE ORGANS: No acute abnormality. BONES AND SOFT TISSUES: No acute osseous  abnormality. No focal soft tissue abnormality. IMPRESSION: 1. Mild diffuse renal atrophy bilaterally with a single punctate calculus in the right kidney. 2. Sigmoid and descending colon diverticulosis without evidence of diverticulitis. 3. Large amount of stool in the rectum. Electronically signed by: Greig Pique MD 04/28/2024 07:10 PM EST RP Workstation: [id number]   DG Chest 1 View Result Date: 04/28/2024 CLINICAL DATA:  Weakness. EXAM: CHEST  1 VIEW COMPARISON:  Chest radiograph dated 07/16/2018. FINDINGS: Faint interstitial densities may be chronic or atypical infiltrate. No consolidative changes. There is no pleural effusion or pneumothorax. Mild cardiomegaly. No acute osseous pathology. IMPRESSION: Chronic interstitial coarsening versus atypical infiltrate. No consolidative changes. Electronically Signed   By: Vanetta Chou M.D.   On: 04/28/2024 18:18    Labs: BMET Recent Labs  Lab 04/28/24 1538 04/28/24 2156 04/29/24 0234  NA 129*  --  131*  K 3.6  --  3.8  CL 87*  --  91*  CO2 10*  --  7*  GLUCOSE 161*  --  97  BUN 217*  --  217*  CREATININE 10.60* 10.40* 10.40*  CALCIUM  6.5*  --  6.4*  PHOS  --   --  14.5*   CBC Recent Labs  Lab 04/28/24 1538 04/28/24 2156 04/29/24 0234  WBC 5.6 5.6 7.1  HGB 14.0 10.4* 9.5*  HCT RESULTS UNAVAILABLE DUE TO INTERFERING SUBSTANCE 28.0* 26.1*  MCV RESULTS UNAVAILABLE DUE TO INTERFERING SUBSTANCE 81.4 82.9  PLT 70* 75* 73*    Medications:  aspirin  EC  81 mg Oral Daily   atorvastatin   80 mg Oral q1800   azaTHIOprine   50 mg Oral Daily   Chlorhexidine  Gluconate Cloth  6 each Topical Q0600   heparin   5,000 Units Subcutaneous Q8H   polyethylene glycol  17 g Oral Daily   predniSONE   5 mg Oral Q breakfast      [name], MD [facility] 04/29/2024, 1:37 PM

## 2024-04-29 NOTE — ED Notes (Signed)
Pt tx to IR  ?

## 2024-04-30 ENCOUNTER — Other Ambulatory Visit: Payer: Self-pay

## 2024-04-30 ENCOUNTER — Telehealth: Payer: Self-pay | Admitting: Orthopedic Surgery

## 2024-04-30 ENCOUNTER — Inpatient Hospital Stay (HOSPITAL_COMMUNITY)

## 2024-04-30 DIAGNOSIS — N186 End stage renal disease: Secondary | ICD-10-CM | POA: Diagnosis not present

## 2024-04-30 LAB — TYPE AND SCREEN
ABO/RH(D): O NEG
Antibody Screen: NEGATIVE

## 2024-04-30 LAB — HEPATITIS B SURFACE ANTIBODY, QUANTITATIVE: Hep B S AB Quant (Post): <3.5 m[IU]/mL — ABNORMAL LOW

## 2024-04-30 LAB — APTT: aPTT: 36 s (ref 24–36)

## 2024-04-30 LAB — CBC
HCT: 25.8 % — ABNORMAL LOW (ref 39.0–52.0)
Hemoglobin: 9.6 g/dL — ABNORMAL LOW (ref 13.0–17.0)
MCH: 30.3 pg (ref 26.0–34.0)
MCHC: 37.2 g/dL — ABNORMAL HIGH (ref 30.0–36.0)
MCV: 81.4 fL (ref 80.0–100.0)
Platelets: 70 K/uL — ABNORMAL LOW (ref 150–400)
RBC: 3.17 MIL/uL — ABNORMAL LOW (ref 4.22–5.81)
RDW: 13.2 % (ref 11.5–15.5)
WBC: 6.4 K/uL (ref 4.0–10.5)
nRBC: 0 % (ref 0.0–0.2)

## 2024-04-30 LAB — RENAL FUNCTION PANEL
Albumin: 3.9 g/dL (ref 3.5–5.0)
Anion gap: 29 — ABNORMAL HIGH (ref 5–15)
BUN: 110 mg/dL — ABNORMAL HIGH (ref 8–23)
CO2: 14 mmol/L — ABNORMAL LOW (ref 22–32)
Calcium: 7.3 mg/dL — ABNORMAL LOW (ref 8.9–10.3)
Chloride: 92 mmol/L — ABNORMAL LOW (ref 98–111)
Creatinine, Ser: 6.13 mg/dL — ABNORMAL HIGH (ref 0.61–1.24)
GFR, Estimated: 10 mL/min — ABNORMAL LOW (ref >60)
Glucose, Bld: 90 mg/dL (ref 70–99)
Phosphorus: 9.3 mg/dL — ABNORMAL HIGH (ref 2.5–4.6)
Potassium: 3 mmol/L — ABNORMAL LOW (ref 3.5–5.1)
Sodium: 135 mmol/L (ref 135–145)

## 2024-04-30 LAB — PROTIME-INR
INR: 1 (ref 0.8–1.2)
Prothrombin Time: 14.3 s (ref 11.4–15.2)

## 2024-04-30 LAB — PARATHYROID HORMONE, INTACT (NO CA): PTH: 212 pg/mL — ABNORMAL HIGH (ref 15–65)

## 2024-04-30 MED ORDER — BISACODYL 10 MG RE SUPP
10.0000 mg | Freq: Once | RECTAL | Status: AC
  Administered 2024-04-30: 15:00:00 10 mg via RECTAL
  Filled 2024-04-30: qty 1

## 2024-04-30 MED ORDER — HEPARIN SODIUM (PORCINE) 5000 UNIT/ML IJ SOLN
5000.0000 [IU] | Freq: Three times a day (TID) | INTRAMUSCULAR | Status: DC
  Filled 2024-04-30 (×3): qty 1

## 2024-04-30 MED ORDER — SODIUM CHLORIDE 0.9 % IV SOLN
25.0000 mg | Freq: Three times a day (TID) | INTRAVENOUS | Status: DC | PRN
  Administered 2024-04-30: 10:00:00 25 mg via INTRAVENOUS
  Filled 2024-04-30: qty 1

## 2024-04-30 MED ORDER — BISACODYL 10 MG RE SUPP: 10.0000 mg | Freq: Once | RECTAL | Status: DC

## 2024-04-30 MED ORDER — DOCUSATE SODIUM 100 MG PO CAPS
200.0000 mg | ORAL_CAPSULE | Freq: Two times a day (BID) | ORAL | Status: DC
  Filled 2024-04-30 (×4): qty 2

## 2024-04-30 MED ADMIN — Potassium Chloride Microencapsulated Crys ER Tab 20 mEq: 20 meq | ORAL | @ 06:00:00 | NDC 00245531989

## 2024-04-30 MED FILL — Potassium Chloride Microencapsulated Crys ER Tab 20 mEq: 20.0000 meq | ORAL | Qty: 1 | Status: AC

## 2024-04-30 NOTE — TOC Initial Note (Addendum)
 Transition of Care [facility]) - Initial/Assessment Note    Patient Details  Name: Jeremy Grant MRN: [id number] Date of Birth: July 15, 1960  Transition of Care [facility]) CM/SW Contact:    Landry DELENA Senters, RN Phone Number: 04/30/2024, 3:06 PM  Clinical Narrative:                 RR:[id number] history significant of chronic kidney disease stage V, hypertension, nonischemic cardiomyopathy systolic and diastolic CHF who presented to ED with significantly profound weakness which is ongoing for about 2 to 3 days.   Patient lives at home with wife. Wife provides support at home, transports to appts, will be transportation home at d/c.   Patient has PCP, wife manages home medications, DME reviewed. Patient being set up for OP HD, Nephrology SW involved and working on getting patient clipped. Wife does report she is able to take patient to HD appts but would like info on transportation if needed. Transportation info added to AVS.   HH rec from therapy. Patient and wife have no preference for this. HH arranged through [facility]. Info on AVS.   Rolling walker and shower/tub bench rec from therapy. Wife reports they already have both of these at home, so these are not needed.   CM will continue to follow.   Expected Discharge Plan: Home w Home Health Services Barriers to Discharge: Continued Medical Work up   Patient Goals and CMS Choice   CMS Medicare.gov Compare Post Acute Care list provided to:: Patient Choice offered to / list presented to : Patient      Expected Discharge Plan and Services       Living arrangements for the past 2 months: Single Family Home                                      Prior Living Arrangements/Services Living arrangements for the past 2 months: Single Family Home Lives with:: Self, Spouse Patient language and need for interpreter reviewed:: Yes Do you feel safe going back to the place where you live?: Yes      Need for Family Participation in Patient  Care: Yes (Comment) Care giver support system in place?: Yes (comment) Current home services: DME (cane, shower bench, BSC, rollator, rolling walker) Criminal Activity/Legal Involvement Pertinent to Current Situation/Hospitalization: No - Comment as needed  Activities of Daily Living   ADL Screening (condition at time of admission) Independently performs ADLs?: Yes (appropriate for developmental age) Is the patient deaf or have difficulty hearing?: No Does the patient have difficulty seeing, even when wearing glasses/contacts?: No Does the patient have difficulty concentrating, remembering, or making decisions?: No  Permission Sought/Granted                  Emotional Assessment Appearance:: Developmentally appropriate Attitude/Demeanor/Rapport: Engaged Affect (typically observed): Calm Orientation: : Oriented to Self, Oriented to Place, Oriented to  Time, Oriented to Situation Alcohol / Substance Use: Not Applicable Psych Involvement: No (comment)  Admission diagnosis:  ESRD (end stage renal disease) (HCC) [N18.6] AKI (acute kidney injury) [N17.9] ESRD needing dialysis (HCC) [N18.6, Z99.2] Patient Active Problem List   Diagnosis Date Noted   ESRD (end stage renal disease) (HCC) 04/28/2024   Hematoma 05/06/2023   Traumatic hematoma of left lower leg 05/04/2023   CKD (chronic kidney disease) stage 4, GFR 15-29 ml/min (HCC) 05/04/2023   Essential hypertension 05/25/2019   Spasm 03/23/2019   Steroid-induced  hyperglycemia    Acute on chronic anemia    Seizures (HCC)    Spastic hemiparesis (HCC)    Vasculitis    Hypertension    Thrombocytopenia    Elevated serum protein level    Multiple myeloma (HCC)    Pancytopenia (HCC)    Normocytic normochromic anemia 07/16/2018   NICM (nonischemic cardiomyopathy) (HCC) 11/12/2013   History of ETOH abuse 11/12/2013   Chronic combined systolic and diastolic CHF (congestive heart failure) (HCC) 11/12/2013   Congestive dilated  cardiomyopathy (HCC) 11/04/2013   PCP:  Burney Darice CROME, MD Pharmacy:   [facility] - [place], [place] - [address] [place] [address] Phone: [phone] Fax: [phone]  [facility] [address] [place] [address] Phone: [phone] Fax: [phone]     Social Drivers of Health (SDOH) Social History: SDOH Screenings   Food Insecurity: No Food Insecurity (04/29/2024)  Housing: Low Risk (04/29/2024)  Transportation Needs: No Transportation Needs (04/29/2024)  Utilities: Not At Risk (04/29/2024)  Tobacco Use: Low Risk (04/28/2024)   SDOH Interventions:     Readmission Risk Interventions     No data to display

## 2024-04-30 NOTE — Evaluation (Signed)
 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   Clinical Impression  Pt is currently mobilizing below his baseline due to fatigue and significant deconditioning. Pt is nauseas throughout session but no vomiting occurs. Pt is modI for bed mobility, using bed rails for support as needed. Pt is steady upon sitting EOB. Pt is CGA for STS and ambulation with SPC though is unsteady and shaky throughout, no LOB occurs. Pt is tolerating short distances at this time due to fatigue and becoming tachy to 160s with activity.  HR improves to 100s at rest, MD arrives at this time and is notified. Pt would benefit from continued PT services focused on strengthening, gait, and endurance to promote strength and activity tolerance needed for safe functional mobility.  Anticipate pt will progress to [facility] level with futher PT and medical improvement.       If plan is discharge home, recommend the following: A little help with walking and/or transfers;A little help with bathing/dressing/bathroom;Assistance with cooking/housework;Assist for transportation;Help with stairs or ramp for entrance   Can travel by private vehicle        Equipment Recommendations Rolling walker (2 wheels)  Recommendations for Other Services       Functional Status Assessment Patient has had a recent decline in their functional status and demonstrates the ability to make significant improvements in function in a reasonable and predictable amount of time.     Precautions / Restrictions Precautions Precautions: Fall Recall of Precautions/Restrictions: Intact Restrictions Weight  Bearing Restrictions Per Provider Order: No      Mobility  Bed Mobility Overal bed mobility: Modified Independent             General bed mobility comments: Uses bed rails as needed for assist, rest break needed prior to scooting forward to place B LE on the floor.    Transfers Overall transfer level: Needs assistance Equipment used: Straight cane Transfers: Sit to/from Stand Sit to Stand: Contact guard assist           General transfer comment: Pt completes STS transfer without assist, CGA needed upon standing due to being unsteady. Pt very shaky upon standing.    Ambulation/Gait Ambulation/Gait assistance: Contact guard assist Gait Distance (Feet): 8 Feet (plus 12' to ambulate to chair) Assistive device: Straight cane Gait Pattern/deviations: Decreased step length - right, Decreased step length - left, Shuffle, Knee flexed in stance - right, Knee flexed in stance - left, Narrow base of support   Gait velocity interpretation: <1.31 ft/sec, indicative of household ambulator   General Gait Details: Pt remains shaky and unsteady with gait, tolerating short distances. Pt becomes tachy while ambulating and is instructed to return to chair. HR stabilzies quickly at rest.  Stairs            Wheelchair Mobility     Tilt Bed    Modified Rankin (Stroke Patients Only)       Balance Overall balance assessment: Mild deficits observed, not formally tested (No LOB occurs during session, pt weak and shaky when standing due to deconditioning. No concerns for seated balance.)  Pertinent Vitals/Pain Pain Assessment Pain Assessment: 0-10 Pain Score: 3  Pain Location: B LE Pain Descriptors / Indicators: Aching, Sore Pain Intervention(s): Limited activity within patient's tolerance, Repositioned, Monitored during session    Home Living Family/patient expects to be discharged to:: Private residence Living  Arrangements: Spouse/significant other Available Help at Discharge: Family;Available 24 hours/day Type of Home: House Home Access: Ramped entrance       Home Layout: One level Home Equipment: Cane - single point;BSC/3in1;Grab bars - tub/shower;Grab bars - toilet;Shower seat      Prior Function Prior Level of Function : Independent/Modified Independent             Mobility Comments: Pt mobilizes with SPC at baseline. ADLs Comments: Pt reports dressing, bathing, and toileting independently at baseline.     Extremity/Trunk Assessment   Upper Extremity Assessment Upper Extremity Assessment: Defer to OT evaluation    Lower Extremity Assessment Lower Extremity Assessment: Generalized weakness;Overall [facility] for tasks assessed    Cervical / Trunk Assessment Cervical / Trunk Assessment: Normal  Communication   Communication Communication: No apparent difficulties    Cognition Arousal: Alert Behavior During Therapy: WFL for tasks assessed/performed   PT - Cognitive impairments: No apparent impairments                         Following commands: Intact       Cueing Cueing Techniques: Verbal cues, Tactile cues, Visual cues     General Comments General comments (skin integrity, edema, etc.): HR to 160s with short distance ambulation, returns to 100s at rest, monitored throughout and patient remains asymptomatic. MD informed. Wound wrapped on L ankle/shin, not observed.    Exercises     Assessment/Plan    PT Assessment Patient needs continued PT services  PT Problem List Decreased strength;Decreased mobility;Decreased activity tolerance;Cardiopulmonary status limiting activity;Decreased balance;Decreased knowledge of use of DME;Decreased skin integrity       PT Treatment Interventions DME instruction;Therapeutic exercise;Gait training;Balance training;Functional mobility training;Therapeutic activities;Patient/family education;Neuromuscular re-education     PT Goals (Current goals can be found in the Care Plan section)  Acute Rehab PT Goals Patient Stated Goal: No specific goals stated during session.    Frequency Min 1X/week     Co-evaluation               AM-PAC PT 6 Clicks Mobility  Outcome Measure Help needed turning from your back to your side while in a flat bed without using bedrails?: None Help needed moving from lying on your back to sitting on the side of a flat bed without using bedrails?: None Help needed moving to and from a bed to a chair (including a wheelchair)?: A Little Help needed standing up from a chair using your arms (e.g., wheelchair or bedside chair)?: A Little Help needed to walk in hospital room?: Total Help needed climbing 3-5 steps with a railing? : Total 6 Click Score: 16    End of Session   Activity Tolerance: Patient limited by fatigue Patient left: in chair;with call bell/phone within reach;with chair alarm set Nurse Communication: Mobility status PT Visit Diagnosis: Unsteadiness on feet (R26.81);Muscle weakness (generalized) (M62.81)    Time: 9157-9097 PT Time Calculation (min) (ACUTE ONLY): 20 min   Charges:   PT Evaluation $PT Eval Moderate Complexity: 1 Mod   PT General Charges $$ ACUTE PT VISIT: 1 Visit         Sabra Morel, PT, DPT  Acute Rehabilitation Services  Office: [phone]     Sabra MARLA Morel 04/30/2024, 9:48 AM

## 2024-04-30 NOTE — Progress Notes (Signed)
 Pt accepted at [facility] on MWF 12:00 pm chair time. Pt will need to arrive at 11:00 am for first treatment to complete paperwork prior to first treatment. Clinic will be running on a holiday schedule for the next two weeks and pt's schedule will be Sun,Tues, Fri at d/c. MWF schedule will resume after the holidays. Pt can start at clinic on Sunday if pt deemed stable for d/c by then. Met with pt and pt's wife at bedside. Discussed above arrangements. Both agreeable to plans. Schedule letter provided to wife as well. HD arrangements added to AVS. Will assist as needed.   Randine Mungo Dialysis Navigator [phone]

## 2024-04-30 NOTE — Evaluation (Signed)
 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     Clinical Impressions Patient admitted for the diagnosis above.  PTA he lives at home with his spouse, who can provide any needed assist.  Currently he is limited by fatigue and generalized weakness.  Up to CGA for simple transfers and Min A with balance support for lower body ADL.  OT will continue efforts in the acute setting to address deficits, and HH OT can be considered if the patient agrees.       If plan is discharge home, recommend the following:   A little help with walking and/or transfers;A little help with bathing/dressing/bathroom;Assist for transportation;Assistance with cooking/housework     Functional Status Assessment   Patient has had a recent decline in their functional status and demonstrates the ability to make significant improvements in function in a reasonable and predictable amount of time.     Equipment Recommendations   Tub/shower seat     Recommendations for Other Services         Precautions/Restrictions   Precautions Precautions: Fall Recall of Precautions/Restrictions: Intact Restrictions Weight Bearing Restrictions Per Provider Order: No     Mobility Bed Mobility Overal bed mobility: Modified Independent                  Transfers Overall transfer level: Needs assistance Equipment used: Straight cane Transfers: Sit to/from Stand Sit to Stand: Contact guard assist, Min assist                  Balance Overall balance assessment: Needs assistance Sitting-balance support: Feet supported Sitting  balance-Leahy Scale: Good     Standing balance support: Single extremity supported Standing balance-Leahy Scale: Fair                             ADL either performed or assessed with clinical judgement   ADL       Grooming: Oral care;Wash/dry face;Set up;Sitting           Upper Body Dressing : Set up;Sitting   Lower Body Dressing: Minimal assistance;Moderate assistance;Sit to/from stand   Toilet Transfer: Contact guard assist;Minimal assistance                   Vision Patient Visual Report: No change from baseline       Perception Perception: Not tested       Praxis Praxis: Not tested       Pertinent Vitals/Pain Pain Assessment Pain Assessment: No/denies pain Pain Intervention(s): Monitored during session     Extremity/Trunk Assessment Upper Extremity Assessment Upper Extremity Assessment: Overall WFL for tasks assessed   Lower Extremity Assessment Lower Extremity Assessment: Defer to PT evaluation   Cervical / Trunk Assessment Cervical / Trunk Assessment: Normal   Communication Communication Communication: No apparent difficulties   Cognition Arousal: Lethargic Behavior During Therapy: WFL for tasks assessed/performed Cognition: No apparent impairments                               Following commands: Intact  Cueing  General Comments   Cueing Techniques: Verbal cues;Tactile cues;Visual cues  HR to 160s with short distance ambulation, returns to 100s at rest, monitored throughout and patient remains asymptomatic. MD informed. Wound wrapped on L ankle/shin, not observed.   Exercises     Shoulder Instructions      Home Living Family/patient expects to be discharged to:: Private residence Living Arrangements: Spouse/significant other Available Help at Discharge: Family;Available 24 hours/day Type of Home: House Home Access: Ramped entrance     Home Layout: One level     Bathroom Shower/Tub:  Chief Strategy Officer: Standard Bathroom Accessibility: No   Home Equipment: Cane - single point;BSC/3in1;Grab bars - tub/shower;Grab bars - toilet;Shower seat          Prior Functioning/Environment Prior Level of Function : Independent/Modified Independent             Mobility Comments: Pt mobilizes with SPC at baseline. ADLs Comments: Pt reports dressing, bathing, and toileting independently at baseline.    OT Problem List: Decreased strength;Decreased activity tolerance;Impaired balance (sitting and/or standing)   OT Treatment/Interventions: Self-care/ADL training;Therapeutic activities;Patient/family education;Balance training;DME and/or AE instruction      OT Goals(Current goals can be found in the care plan section)   Acute Rehab OT Goals Patient Stated Goal: Return home OT Goal Formulation: With patient Time For Goal Achievement: 05/14/24 Potential to Achieve Goals: Good   OT Frequency:  Min 2X/week    Co-evaluation              AM-PAC OT 6 Clicks Daily Activity     Outcome Measure Help from another person eating meals?: None Help from another person taking care of personal grooming?: A Little Help from another person toileting, which includes using toliet, bedpan, or urinal?: A Little Help from another person bathing (including washing, rinsing, drying)?: A Little Help from another person to put on and taking off regular upper body clothing?: None Help from another person to put on and taking off regular lower body clothing?: A Little 6 Click Score: 20   End of Session Nurse Communication: Mobility status  Activity Tolerance: Patient tolerated treatment well;Patient limited by lethargy Patient left: in bed;with call bell/phone within reach;with family/visitor present  OT Visit Diagnosis: Unsteadiness on feet (R26.81)                Time: 8684-8664 OT Time Calculation (min): 20 min Charges:  OT General Charges $OT Visit: 1  Visit OT Evaluation $OT Eval Moderate Complexity: 1 Mod  04/30/2024  RP, OTR/L  Acute Rehabilitation Services  Office:  [phone]   [name] 04/30/2024, 1:41 PM

## 2024-04-30 NOTE — Plan of Care (Signed)
   Problem: Clinical Measurements: Goal: Respiratory complications will improve Outcome: Progressing Goal: Cardiovascular complication will be avoided Outcome: Progressing

## 2024-04-30 NOTE — Progress Notes (Signed)
 PROGRESS NOTE    Jeremy Grant  FMW:[id number] DOB: [birth year] DOA: 04/28/2024 PCP: Burney Darice CROME, MD    Brief Narrative:  63 y.o. male with medical history significant of chronic kidney disease stage V, hypertension, nonischemic cardiomyopathy systolic and diastolic CHF who presented to ED with significantly profound weakness which is ongoing for about 2 to 3 days.  He denied any other complaint such as nausea, vomiting, fever, chills, sweating, chest pain or shortness of breath.  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  He was last seen by outpatien nephrology on 02/26/2024 with a creatinine of 5.81, BUN 112, GFR 10, and K 4.3. He has been on prednisone  5 mg daily and azathioprine  50 mg daily.   In the ED, he was afebrile, HR 70, RR 15, BP 98/59, SpO2 100% on RA.  CBC results initially unclear due to interfering substance reported by lab, later repeated to show hemoglobin 10.4, platelets 75.  CMP showed sodium 129, potassium 3.6, chloride 87, BUN 217, creatinine 10.6, calcium  6.5, anion gap 32.  PT/INR within normal limits.  UA showed small Hgb and 30 protein.  TSH and FT4 within normal limits.  4 Plex PCR negative.  CXR unremarkable.  Nephrology was consulted recommended CT abdomen pelvis which showed diverticulosis and diffuse renal atrophy bilaterally and a single punctate calculus in the right kidney, large stool burden.  Nephrology recommended admission for initiation of hemodialysis.  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 with signs of uremia >> nausea, metabolic acidosis - Nephrology consulted underwent right IJ HD catheter placement, HD started, still needs a few runs as he is still nauseated from uremia, continue HD for acidosis and fluid removal  #Hyperphosphatemia - Secondary to  ESRD - Phosphorus 14.5 - Management with HD  #Metabolic acidosis - Likely secondary to ESRD - Receiving LR at 75 mL/h for 12 hours - Further management with HD  # Hypocalcemia - Secondary to ESRD - Received 1 g calcium   # Hyponatremia - Secondary to ESRD and impaired free water excretion - Improved to 131  #Anemia of chronic disease - Baseline hemoglobin 9-10 - Hgb stable at baseline  #Chronic thrombocytopenia - Platelets 73 - No active bleeding - Continue to monitor  # ANCA-positive necrotizing crescentic GN - Continue home Imuran  and prednisone  - Nephrology following  # Hypertension - Presented with borderline hypotension, currently normotensive - Home BP meds held  # Chronic left leg wound - Chronic, follows with outpatient wound care and Dr. Harden. Wife performs daily wound care at home.  - WOC consulted  # HX of stroke, chronic left-sided weakness.  Aspirin  and statin for secondary prevention.  PT OT.    Constipation.  Bowel regimen.    Generalized weakness - Likely secondary to uremia, hypocalcemia, and metabolic acidosis. Management as above - PT/OT consulted for evaluation   DVT prophylaxis: Place and maintain sequential compression device Start: 04/30/24 0049 Heparin  added on 04/30/2024.  Code Status:   Code Status: Full Code  Family Communication: Discussed with wife at bedside on 04/30/2024  Disposition Plan: Pending clinical improvement after HD initiation and securing outpatient HD chair PT - Follow Up Recommendations: Home health PT OT -    DME Needs: PT equipment: Rolling walker (2 wheels)     Level of care:  Telemetry  Consultants:  Nephrology  Procedures:  Tunneled hemodialysis catheter placement 12/17  Antimicrobials: None  Subjective:  Patient in bed, appears comfortable, denies any headache, no fever, no chest pain or pressure, no shortness of breath , no abdominal pain but does have nausea. No focal  weakness.  Objective: Vitals:   04/29/24 2255 04/29/24 2311 04/30/24 0400 04/30/24 0800  BP: (!) 151/86 124/80 123/62 133/74  Pulse: 79 84 72 75  Resp: 18 18 15 12   Temp: 97.7 F (36.5 C) 97.7 F (36.5 C)  98.2 F (36.8 C)  TempSrc: Oral Oral    SpO2: 97% 97% 98% 96%  Weight: 58.6 kg     Height: 6' 1 (1.854 m)       Intake/Output Summary (Last 24 hours) at 04/30/2024 1027 Last data filed at 04/30/2024 1015 Gross per 24 hour  Intake --  Output 850 ml  Net -850 ml   Filed Weights   04/29/24 1402 04/29/24 1706 04/29/24 2255  Weight: (S) 62.8 kg (S) 62.6 kg 58.6 kg    Examination:  Awake Alert, chronic left-sided weakness from previous stroke, right IJ HD catheter [place].AT,PERRAL Supple Neck, No JVD,   Symmetrical Chest wall movement, Good air movement bilaterally, CTAB RRR,No Gallops, Rubs or new Murmurs,  +ve B.Sounds, Abd Soft, No tenderness,   No Cyanosis, Clubbing or edema     Data Reviewed: I have personally reviewed following labs and imaging studies   Data Review:   Patient Lines/Drains/Airways Status     Active Line/Drains/Airways     Name Placement date Placement time Site Days   Peripheral IV 04/28/24 20 G Anterior;Proximal;Right Forearm 04/28/24  1610  Forearm  2   Peripheral IV 04/30/24 22 G 1.75 Anterior;Left Forearm 04/30/24  1007  Forearm  less than 1   Hemodialysis Catheter Right Internal jugular Double lumen Permanent (Tunneled) 04/29/24  0956  Internal jugular  1   Urethral Catheter RN who placed Foley did not chart it Double-lumen 04/28/24  --  Double-lumen  2   Wound 04/29/24 1401 Vascular Ulcer Tibial Left;Posterior;Proximal 04/29/24  1401  Tibial  1   Wound 04/29/24 2255 Other (Comment) Toe (Comment  which one) Anterior;Left 04/29/24  2255  Toe (Comment  which one)  1   Wound 04/29/24 2255 Other (Comment) Wrist Lower;Right;Lateral 04/29/24  2255  Wrist  1             Inpatient Medications  Scheduled Meds:  aspirin  EC  81 mg Oral  Daily   atorvastatin   80 mg Oral q1800   azaTHIOprine   50 mg Oral Daily   bisacodyl   10 mg Rectal Once   Chlorhexidine  Gluconate Cloth  6 each Topical Q0600   docusate sodium   200 mg Oral BID   polyethylene glycol  17 g Oral Daily   predniSONE   5 mg Oral Q breakfast   Continuous Infusions:  promethazine  (PHENERGAN ) injection (IM or IVPB) 25 mg (04/30/24 1013)   PRN Meds:.acetaminophen  **OR** acetaminophen , ondansetron  **OR** ondansetron  (ZOFRAN ) IV, oxyCODONE -acetaminophen  **AND** oxyCODONE , promethazine  (PHENERGAN ) injection (IM or IVPB), tiZANidine   DVT Prophylaxis  Place and maintain sequential compression device Start: 04/30/24 0049   Recent Labs  Lab 04/28/24 1538 04/28/24 2156 04/29/24 0234 04/30/24 0111  WBC 5.6 5.6 7.1 6.4  HGB 14.0 10.4* 9.5* 9.6*  HCT RESULTS UNAVAILABLE DUE TO INTERFERING SUBSTANCE 28.0* 26.1* 25.8*  PLT 70* 75* 73* 70*  MCV RESULTS UNAVAILABLE DUE TO INTERFERING SUBSTANCE 81.4 82.9 81.4  MCH RESULTS UNAVAILABLE DUE TO INTERFERING  SUBSTANCE 30.2 30.2 30.3  MCHC RESULTS UNAVAILABLE DUE TO INTERFERING SUBSTANCE 37.1* 36.4* 37.2*  RDW RESULTS UNAVAILABLE DUE TO INTERFERING SUBSTANCE 13.1 13.1 13.2    Recent Labs  Lab 04/28/24 1538 04/28/24 2156 04/29/24 0234 04/30/24 0111  NA 129*  --  131* 135  K 3.6  --  3.8 3.0*  CL 87*  --  91* 92*  CO2 10*  --  7* 14*  ANIONGAP 32*  --  33* 29*  GLUCOSE 161*  --  97 90  BUN 217*  --  217* 110*  CREATININE 10.60* 10.40* 10.40* 6.13*  AST 20  --   --   --   ALT 20  --   --   --   ALKPHOS 77  --   --   --   BILITOT 0.6  --   --   --   ALBUMIN 4.4  --  4.0 3.9  INR 1.1  --   --  1.0  TSH  --  1.990 1.780  --   PHOS  --   --  14.5* 9.3*  CALCIUM  6.5*  --  6.4* 7.3*      Recent Labs  Lab 04/28/24 1538 04/28/24 2156 04/29/24 0234 04/30/24 0111  INR 1.1  --   --  1.0  TSH  --  1.990 1.780  --   CALCIUM  6.5*  --  6.4* 7.3*     --------------------------------------------------------------------------------------------------------------- Lab Results  Component Value Date   CHOL 151 05/25/2021   HDL 47 05/25/2021   LDLCALC 81 05/25/2021   TRIG 133 05/25/2021   CHOLHDL 3.2 05/25/2021    Lab Results  Component Value Date   HGBA1C 5.8 (A) 04/23/2024   Recent Labs    04/28/24 1538 04/28/24 2156 04/29/24 0234  TSH  --    < > 1.780  FREET4 1.52  --   --    < > = values in this interval not displayed.   No results for input(s): VITAMINB12, FOLATE, FERRITIN, TIBC, IRON, RETICCTPCT in the last 72 hours. ------------------------------------------------------------------------------------------------------------------ Cardiac Enzymes No results for input(s): CKMB, TROPONINI, MYOGLOBIN in the last 168 hours.  Invalid input(s): CK  Micro Results Recent Results (from the past 240 hours)  Resp panel by RT-PCR (RSV, Flu A&B, Covid) Anterior Nasal Swab     Status: None   Collection Time: 04/28/24  5:14 PM   Specimen: Anterior Nasal Swab  Result Value Ref Range Status   SARS Coronavirus 2 by RT PCR NEGATIVE NEGATIVE Final   Influenza A by PCR NEGATIVE NEGATIVE Final   Influenza B by PCR NEGATIVE NEGATIVE Final    Comment: (NOTE) The Xpert Xpress SARS-CoV-2/FLU/RSV plus assay is intended as an aid in the diagnosis of influenza from Nasopharyngeal swab specimens and should not be used as a sole basis for treatment. Nasal washings and aspirates are unacceptable for Xpert Xpress SARS-CoV-2/FLU/RSV testing.  Fact Sheet for Patients: bloggercourse.com  Fact Sheet for Healthcare Providers: seriousbroker.it  This test is not yet approved or cleared by the [place]  FDA and has been authorized for detection and/or diagnosis of SARS-CoV-2 by FDA under an Emergency Use Authorization (EUA). This EUA will remain in effect (meaning this  test can be used) for the duration of the COVID-19 declaration under Section 564(b)(1) of the Act, 21 U.S.C. section 360bbb-3(b)(1), unless the authorization is terminated or revoked.     Resp Syncytial Virus by PCR NEGATIVE NEGATIVE Final    Comment: (NOTE) Fact Sheet for Patients: bloggercourse.com  Fact Sheet for Healthcare Providers: seriousbroker.it  This test is not yet approved or cleared by the [place]  FDA and has been authorized for detection and/or diagnosis of SARS-CoV-2 by FDA under an Emergency Use Authorization (EUA). This EUA will remain in effect (meaning this test can be used) for the duration of the COVID-19 declaration under Section 564(b)(1) of the Act, 21 U.S.C. section 360bbb-3(b)(1), unless the authorization is terminated or revoked.  Performed at [facility], [address] [place], [place] [address]     Radiology Reports  DG Abd Portable 1V Result Date: 04/30/2024 CLINICAL DATA:  [id number] Constipated [id number] EXAM: PORTABLE ABDOMEN - 1 VIEW COMPARISON:  April 28, 2024 FINDINGS: Nonobstructive bowel gas pattern.Small volume fecal loading in the transverse colonno pneumoperitoneum. No organomegaly or radiopaque calculi. No acute fracture or destructive lesion. Aortoiliac atherosclerosis. IMPRESSION: Nonobstructive bowel gas pattern. Small volume fecal loading in the transverse colon Electronically Signed   By: Rogelia Myers M.D.   On: 04/30/2024 10:11   IR TUNNELED CENTRAL VENOUS CATH [facility] W IMG Result Date: 04/29/2024 INDICATION: 63 year old with end-stage renal disease and needs hemodialysis. EXAM: FLUOROSCOPIC AND ULTRASOUND GUIDED PLACEMENT OF A TUNNELED DIALYSIS CATHETER Physician: Juliene SAUNDERS. Philip, MD MEDICATIONS: Ancef  2 g; The antibiotic was administered within an appropriate time interval prior to skin puncture. ANESTHESIA/SEDATION: Moderate (conscious) sedation was employed during this  procedure. A total of Versed  0.5 mg and fentanyl  25 mcg was administered intravenously at the order of the provider performing the procedure. Total intra-service moderate sedation time: 20 minutes. Patient's level of consciousness and vital signs were monitored continuously by radiology nurse throughout the procedure under the supervision of the provider performing the procedure. FLUOROSCOPY TIME:  Radiation Exposure Index (as provided by the fluoroscopic device): 1 mGy Kerma COMPLICATIONS: None immediate. PROCEDURE: Informed consent was obtained for placement of a tunneled dialysis catheter. The patient was placed supine on the interventional table. Ultrasound confirmed a patent right internal jugular vein. Ultrasound image obtained for documentation. The right neck and chest was prepped and draped in a sterile fashion. Maximal barrier sterile technique was utilized including caps, mask, sterile gowns, sterile gloves, sterile drape, hand hygiene and skin antiseptic. The right neck was anesthetized with 1% lidocaine . A small incision was made with #11 blade scalpel. A 21 gauge needle directed into the right internal jugular vein with ultrasound guidance. A micropuncture dilator set was placed. A 19 cm tip to cuff Palindrome catheter was selected. The skin below the right clavicle was anesthetized and a small incision was made with an #11 blade scalpel. A subcutaneous tunnel was formed to the vein dermatotomy site. The catheter was brought through the tunnel. The vein dermatotomy site was dilated to accommodate a peel-away sheath over a wire. The catheter was placed through the peel-away sheath and directed into the central venous structures. The tip of the catheter was placed at superior cavoatrial junction with fluoroscopy. Fluoroscopic images were obtained for documentation. Both lumens were found to aspirate and flush well. The proper amount of heparin  was flushed in both lumens. The vein dermatotomy site was  closed using a single layer of absorbable suture and Dermabond. Gel-Foam placed in subcutaneous tract. The catheter was secured to the skin using Prolene suture. IMPRESSION: Successful placement of a right jugular tunneled dialysis catheter using ultrasound and fluoroscopic guidance. Electronically Signed   By: Juliene Philip M.D.   On: 04/29/2024 10:49   CT ABDOMEN PELVIS WO CONTRAST Result Date: 04/28/2024 EXAM: CT ABDOMEN AND  PELVIS WITHOUT CONTRAST 04/28/2024 07:00:55 PM TECHNIQUE: CT of the abdomen and pelvis was performed without the administration of intravenous contrast. Multiplanar reformatted images are provided for review. Automated exposure control, iterative reconstruction, and/or weight-based adjustment of the mA/kV was utilized to reduce the radiation dose to as low as reasonably achievable. COMPARISON: CT abdomen and pelvis 09/15/2020. CLINICAL HISTORY: aki FINDINGS: LOWER CHEST: No acute abnormality. LIVER: The liver is unremarkable. GALLBLADDER AND BILE DUCTS: Gallbladder is unremarkable. No biliary ductal dilatation. SPLEEN: No acute abnormality. PANCREAS: No acute abnormality. ADRENAL GLANDS: No acute abnormality. KIDNEYS, URETERS AND BLADDER: There is mild diffuse renal atrophy bilaterally. There is a single punctate calculus in the right kidney. No stones in the ureters. No hydronephrosis. No perinephric or periureteral stranding. The bladder is decompressed by Foley catheter. GI AND BOWEL: Stomach demonstrates no acute abnormality. There is sigmoid and descending colon diverticulosis. The appendix is within normal limits. There is a large amount of stool in the rectum. There is no bowel obstruction. PERITONEUM AND RETROPERITONEUM: No ascites. No free air. VASCULATURE: Aorta is normal in caliber. There are atherosclerotic calcifications of the aorta. LYMPH NODES: No lymphadenopathy. REPRODUCTIVE ORGANS: No acute abnormality. BONES AND SOFT TISSUES: No acute osseous abnormality. No focal soft  tissue abnormality. IMPRESSION: 1. Mild diffuse renal atrophy bilaterally with a single punctate calculus in the right kidney. 2. Sigmoid and descending colon diverticulosis without evidence of diverticulitis. 3. Large amount of stool in the rectum. Electronically signed by: Greig Pique MD 04/28/2024 07:10 PM EST RP Workstation: [id number]   DG Chest 1 View Result Date: 04/28/2024 CLINICAL DATA:  Weakness. EXAM: CHEST  1 VIEW COMPARISON:  Chest radiograph dated 07/16/2018. FINDINGS: Faint interstitial densities may be chronic or atypical infiltrate. No consolidative changes. There is no pleural effusion or pneumothorax. Mild cardiomegaly. No acute osseous pathology. IMPRESSION: Chronic interstitial coarsening versus atypical infiltrate. No consolidative changes. Electronically Signed   By: Vanetta Chou M.D.   On: 04/28/2024 18:18      Signature  -   Lavada Stank M.D on 04/30/2024 at 10:27 AM   -  To page go to [email or web address]

## 2024-04-30 NOTE — Progress Notes (Signed)
  [facility] Progress Note    Assessment/ Plan:   # CKD stage V, now ESRD - s/p TDC placement 12/17 with IR. Slow start protocol. HD#1 12/17. Hold HD given rapid drop in BUN. Plan for #2 tomorrow - CLIP: [facility] MWF -holding off on VVS consult for perm access creation. Had a lengthy convo with wife in regards to home therapies/PD. 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 A biopsy will not add much utility in his particular case given how diffusely atrophied his kidneys are on imaging. Also not much hematuria on urine sediment from 12/16 -c/w imuran  and pred.    Subjective:   Patient seen and examined bedside. Tolerated HD yesterday with net uf 0. Still with N/V   Objective:   BP 133/74 (BP Location: Right Arm)   Pulse 75   Temp 98.2 F (36.8 C)   Resp 12   Ht 6' 1 (1.854 m)   Wt 58.6 kg   SpO2 96%   BMI 17.04 kg/m   Intake/Output Summary (Last 24 hours) at 04/30/2024 1229 Last data filed at 04/30/2024 1015 Gross per 24 hour  Intake --  Output 850 ml  Net -850 ml   Weight change:   Physical Exam: Gen: chronically ill appearing, NAD, laying flat CVS: RRR Resp: CTA BL Abd: soft, nt/nd Ext: no edema Neuro: awake, alert, intermittent myoclonic jerks Dialysis access: [facility]  Imaging: DG Abd Portable 1V Result Date: 04/30/2024 CLINICAL DATA:  [id number] Constipated [id number] EXAM: PORTABLE ABDOMEN - 1 VIEW COMPARISON:   April 28, 2024 FINDINGS: Nonobstructive bowel gas pattern.Small volume fecal loading in the transverse colonno pneumoperitoneum. No organomegaly or radiopaque calculi. No acute fracture or destructive lesion. Aortoiliac atherosclerosis. IMPRESSION: Nonobstructive bowel gas pattern. Small volume fecal loading in the transverse colon Electronically Signed   By: Rogelia Myers M.D.   On: 04/30/2024 10:11   IR TUNNELED CENTRAL VENOUS CATH [facility] W IMG Result Date: 04/29/2024 INDICATION: 63 year old with end-stage renal disease and needs hemodialysis. EXAM: FLUOROSCOPIC AND ULTRASOUND GUIDED PLACEMENT OF A TUNNELED DIALYSIS CATHETER Physician: Juliene SAUNDERS. Philip, MD MEDICATIONS: Ancef  2 g; The antibiotic was administered within an appropriate time interval prior to skin puncture. ANESTHESIA/SEDATION: Moderate (conscious) sedation was employed during this procedure. A total of Versed  0.5 mg and fentanyl  25 mcg was administered intravenously at the order of the provider performing the procedure. Total intra-service moderate sedation time: 20 minutes. Patient's level of consciousness and vital signs were monitored continuously by radiology nurse throughout the procedure under the supervision of the provider performing the procedure. FLUOROSCOPY TIME:  Radiation Exposure Index (as provided by the fluoroscopic device): 1 mGy Kerma COMPLICATIONS: None immediate. PROCEDURE: Informed consent was obtained for placement of a tunneled dialysis catheter. The patient was placed supine on the interventional table. Ultrasound confirmed a patent right internal jugular vein. Ultrasound image obtained for documentation. The right neck and chest  was prepped and draped in a sterile fashion. Maximal barrier sterile technique was utilized including caps, mask, sterile gowns, sterile gloves, sterile drape, hand hygiene and skin antiseptic. The right neck was anesthetized with 1% lidocaine . A small incision was made with #11 blade scalpel. A 21  gauge needle directed into the right internal jugular vein with ultrasound guidance. A micropuncture dilator set was placed. A 19 cm tip to cuff Palindrome catheter was selected. The skin below the right clavicle was anesthetized and a small incision was made with an #11 blade scalpel. A subcutaneous tunnel was formed to the vein dermatotomy site. The catheter was brought through the tunnel. The vein dermatotomy site was dilated to accommodate a peel-away sheath over a wire. The catheter was placed through the peel-away sheath and directed into the central venous structures. The tip of the catheter was placed at superior cavoatrial junction with fluoroscopy. Fluoroscopic images were obtained for documentation. Both lumens were found to aspirate and flush well. The proper amount of heparin  was flushed in both lumens. The vein dermatotomy site was closed using a single layer of absorbable suture and Dermabond. Gel-Foam placed in subcutaneous tract. The catheter was secured to the skin using Prolene suture. IMPRESSION: Successful placement of a right jugular tunneled dialysis catheter using ultrasound and fluoroscopic guidance. Electronically Signed   By: Juliene Balder M.D.   On: 04/29/2024 10:49   CT ABDOMEN PELVIS WO CONTRAST Result Date: 04/28/2024 EXAM: CT ABDOMEN AND PELVIS WITHOUT CONTRAST 04/28/2024 07:00:55 PM TECHNIQUE: CT of the abdomen and pelvis was performed without the administration of intravenous contrast. Multiplanar reformatted images are provided for review. Automated exposure control, iterative reconstruction, and/or weight-based adjustment of the mA/kV was utilized to reduce the radiation dose to as low as reasonably achievable. COMPARISON: CT abdomen and pelvis 09/15/2020. CLINICAL HISTORY: aki FINDINGS: LOWER CHEST: No acute abnormality. LIVER: The liver is unremarkable. GALLBLADDER AND BILE DUCTS: Gallbladder is unremarkable. No biliary ductal dilatation. SPLEEN: No acute abnormality. PANCREAS:  No acute abnormality. ADRENAL GLANDS: No acute abnormality. KIDNEYS, URETERS AND BLADDER: There is mild diffuse renal atrophy bilaterally. There is a single punctate calculus in the right kidney. No stones in the ureters. No hydronephrosis. No perinephric or periureteral stranding. The bladder is decompressed by Foley catheter. GI AND BOWEL: Stomach demonstrates no acute abnormality. There is sigmoid and descending colon diverticulosis. The appendix is within normal limits. There is a large amount of stool in the rectum. There is no bowel obstruction. PERITONEUM AND RETROPERITONEUM: No ascites. No free air. VASCULATURE: Aorta is normal in caliber. There are atherosclerotic calcifications of the aorta. LYMPH NODES: No lymphadenopathy. REPRODUCTIVE ORGANS: No acute abnormality. BONES AND SOFT TISSUES: No acute osseous abnormality. No focal soft tissue abnormality. IMPRESSION: 1. Mild diffuse renal atrophy bilaterally with a single punctate calculus in the right kidney. 2. Sigmoid and descending colon diverticulosis without evidence of diverticulitis. 3. Large amount of stool in the rectum. Electronically signed by: Greig Pique MD 04/28/2024 07:10 PM EST RP Workstation: [id number]   DG Chest 1 View Result Date: 04/28/2024 CLINICAL DATA:  Weakness. EXAM: CHEST  1 VIEW COMPARISON:  Chest radiograph dated 07/16/2018. FINDINGS: Faint interstitial densities may be chronic or atypical infiltrate. No consolidative changes. There is no pleural effusion or pneumothorax. Mild cardiomegaly. No acute osseous pathology. IMPRESSION: Chronic interstitial coarsening versus atypical infiltrate. No consolidative changes. Electronically Signed   By: Vanetta Chou M.D.   On: 04/28/2024 18:18    Labs: BMET Recent Labs  Lab 04/28/24 1538  04/28/24 2156 04/29/24 0234 04/30/24 0111  NA 129*  --  131* 135  K 3.6  --  3.8 3.0*  CL 87*  --  91* 92*  CO2 10*  --  7* 14*  GLUCOSE 161*  --  97 90  BUN 217*  --  217* 110*   CREATININE 10.60* 10.40* 10.40* 6.13*  CALCIUM  6.5*  --  6.4* 7.3*  PHOS  --   --  14.5* 9.3*   CBC Recent Labs  Lab 04/28/24 1538 04/28/24 2156 04/29/24 0234 04/30/24 0111  WBC 5.6 5.6 7.1 6.4  HGB 14.0 10.4* 9.5* 9.6*  HCT RESULTS UNAVAILABLE DUE TO INTERFERING SUBSTANCE 28.0* 26.1* 25.8*  MCV RESULTS UNAVAILABLE DUE TO INTERFERING SUBSTANCE 81.4 82.9 81.4  PLT 70* 75* 73* 70*    Medications:     aspirin  EC  81 mg Oral Daily   atorvastatin   80 mg Oral q1800   azaTHIOprine   50 mg Oral Daily   bisacodyl   10 mg Rectal Once   Chlorhexidine  Gluconate Cloth  6 each Topical Q0600   docusate sodium   200 mg Oral BID   heparin  injection (subcutaneous)  5,000 Units Subcutaneous Q8H   polyethylene glycol  17 g Oral Daily   predniSONE   5 mg Oral Q breakfast     [name], MD [facility] 04/30/2024, 12:29 PM

## 2024-04-30 NOTE — Telephone Encounter (Signed)
 FYI this pt was in the office last week for f/u LE debridement 1 year out. Working on PRP approval for in office application to stalled wound. He is currently in the hospital in kidney failure. The pt's wife called and wanted you to know.

## 2024-04-30 NOTE — Progress Notes (Signed)
 Patient is currently refusing PO medications due to nausea and dry heaving

## 2024-04-30 NOTE — Progress Notes (Signed)
 Arrived to unit from ED via bed. Patient is alert and oriented x4. Denies pain, shortness or breath, palpitations, or chest pain. VSS.  Head to to assessment completed. Skin assessment performed with Elspeth, RN; finding documented and photographs available in media. Notable findings include a 5 x 3 cm blood blister to the right wrist. Margins marked and blister covered. Cross-cover, Dr. Marcene, notified.  Patient oriented to room, call light, bed controls, and unit. Safety measures reviewed and plan of care discussed with patient, including NPO status. Patient verbalized understanding. Call light within reach, bed in lowest position.

## 2024-04-30 NOTE — Progress Notes (Signed)
 [facility] night cross cover note:   I was notified by the patient's RN of a 5.3 cm blood blister noted on the patient's right wrist, with picture uploaded to media tab.  RN conveys that the patient does not recall any recent injury to the right upper extremity, but rather that he has a history of bruising easily.  Currently on daily baby aspirin .  Additionally, he is on heparin  5000 units subcu 3 times daily for VTE prophylaxis here.  No report of any acute weakness or acute sensory changes associated with the right upper extremity.  I have subsequently discontinued his sq heparin  for VTE prophylaxis and replaced with scd's. I also ordered PTT, INR with his AM labs, noting that he has cbc also ordered with these AM labs. I've also ordered an updated type and screen.    Eva Pore, DO Hospitalist

## 2024-04-30 NOTE — Telephone Encounter (Signed)
 Pt's wife called wanting to let the Dr know that her husband (pt) is in the hospital with kidney failure. Call back number is [phone].

## 2024-05-01 DIAGNOSIS — N186 End stage renal disease: Secondary | ICD-10-CM | POA: Diagnosis not present

## 2024-05-01 DIAGNOSIS — L97921 Non-pressure chronic ulcer of unspecified part of left lower leg limited to breakdown of skin: Secondary | ICD-10-CM | POA: Diagnosis not present

## 2024-05-01 MED ORDER — METOPROLOL TARTRATE 25 MG PO TABS
25.0000 mg | ORAL_TABLET | Freq: Two times a day (BID) | ORAL | Status: DC
  Administered 2024-05-01 – 2024-05-04 (×7): 25 mg via ORAL
  Filled 2024-05-01 (×7): qty 1

## 2024-05-01 MED ORDER — HEPARIN SODIUM (PORCINE) 1000 UNIT/ML IJ SOLN
INTRAMUSCULAR | Status: AC
  Filled 2024-05-01: qty 4

## 2024-05-01 MED ORDER — CALCITRIOL 0.25 MCG PO CAPS: 0.2500 ug | ORAL_CAPSULE | ORAL | Status: DC | PRN

## 2024-05-01 MED ORDER — METOPROLOL TARTRATE 50 MG PO TABS: 50.0000 mg | ORAL_TABLET | Freq: Two times a day (BID) | ORAL | Status: DC

## 2024-05-01 MED ORDER — METOPROLOL TARTRATE 25 MG PO TABS: 25.0000 mg | ORAL_TABLET | Freq: Two times a day (BID) | ORAL | Status: DC

## 2024-05-01 MED ORDER — METOPROLOL TARTRATE 5 MG/5ML IV SOLN
5.0000 mg | Freq: Three times a day (TID) | INTRAVENOUS | Status: DC | PRN
  Administered 2024-05-01: 5 mg via INTRAVENOUS

## 2024-05-01 MED ORDER — BISACODYL 5 MG PO TBEC
10.0000 mg | DELAYED_RELEASE_TABLET | Freq: Once | ORAL | Status: AC
  Administered 2024-05-01: 10 mg via ORAL
  Filled 2024-05-01: qty 2

## 2024-05-01 MED ORDER — METOPROLOL TARTRATE 5 MG/5ML IV SOLN
INTRAVENOUS | Status: AC
  Filled 2024-05-01: qty 5

## 2024-05-01 MED ORDER — CALCIUM ACETATE (PHOS BINDER) 667 MG PO CAPS
667.0000 mg | ORAL_CAPSULE | Freq: Three times a day (TID) | ORAL | Status: DC
  Administered 2024-05-01 – 2024-05-04 (×8): 667 mg via ORAL
  Filled 2024-05-01 (×9): qty 1

## 2024-05-01 MED ORDER — NEPRO/CARBSTEADY PO LIQD
237.0000 mL | Freq: Two times a day (BID) | ORAL | Status: DC
  Administered 2024-05-02: 237 mL via ORAL

## 2024-05-01 NOTE — Care Management Important Message (Signed)
 Important Message  Patient Details  Name: Jeremy Grant MRN: [id number] Date of Birth: 04-23-1961   Important Message Given:  Yes - Medicare IM     Claretta Deed 05/01/2024, 2:04 PM

## 2024-05-01 NOTE — Progress Notes (Signed)
 Update provided to medical team this morning that pt can start at clinic on Sunday if pt deemed stable by then. Attending feels pt will likely not be stable for d/c until Mon or Tues. Update provided to [facility] that pt will likely not be starting on Sunday. Will assist as needed.   Randine Mungo Dialysis Navigator [phone]

## 2024-05-01 NOTE — Progress Notes (Signed)
 " PROGRESS NOTE    Jeremy Grant  FMW:[id number] DOB: Sep 18, 1960 DOA: 04/28/2024 PCP: Burney Darice CROME, MD    Brief Narrative:  63 y.o. male with medical history significant of chronic kidney disease stage V, hypertension, nonischemic cardiomyopathy systolic and diastolic CHF who presented to ED with significantly profound weakness which is ongoing for about 2 to 3 days.  He denied any other complaint such as nausea, vomiting, fever, chills, sweating, chest pain or shortness of breath.  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  He was last seen by outpatien nephrology on 02/26/2024 with a creatinine of 5.81, BUN 112, GFR 10, and K 4.3. He has been on prednisone  5 mg daily and azathioprine  50 mg daily.   In the ED, he was afebrile, HR 70, RR 15, BP 98/59, SpO2 100% on RA.  CBC results initially unclear due to interfering substance reported by lab, later repeated to show hemoglobin 10.4, platelets 75.  CMP showed sodium 129, potassium 3.6, chloride 87, BUN 217, creatinine 10.6, calcium  6.5, anion gap 32.  PT/INR within normal limits.  UA showed small Hgb and 30 protein.  TSH and FT4 within normal limits.  4 Plex PCR negative.  CXR unremarkable.  Nephrology was consulted recommended CT abdomen pelvis which showed diverticulosis and diffuse renal atrophy bilaterally and a single punctate calculus in the right kidney, large stool burden.  Nephrology recommended admission for initiation of hemodialysis.  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 with signs of uremia >> nausea, metabolic acidosis - Nephrology consulted underwent right IJ HD catheter placement, HD started, still needs a few runs as he is still nauseated from uremia, continue HD for acidosis and fluid removal, due for another HD on 05/01/2024 note  patient is having persistent nausea due to uremia.  #Hyperphosphatemia - Secondary to ESRD - Phosphorus 14.5 - Management with HD  #Metabolic acidosis - Likely secondary to ESRD - Receiving LR at 75 mL/h for 12 hours - Further management with HD  # Hypocalcemia - Secondary to ESRD - Received 1 g calcium   # Hyponatremia - Secondary to ESRD and impaired free water excretion - Improved to 131  #Anemia of chronic disease - Baseline hemoglobin 9-10 - Hgb stable at baseline  #Chronic thrombocytopenia - Platelets 73 - No active bleeding - Continue to monitor  # ANCA-positive necrotizing crescentic GN - Continue home Imuran  and prednisone  - Nephrology following  # Hypertension - Low-dose beta-blocker and monitor  # Chronic left leg wound - Chronic, follows with outpatient wound care and Dr. Harden. Wife performs daily wound care at home.  - WOC consulted, Dr Harden following.  # HX of stroke, chronic left-sided weakness.  Aspirin  and statin for secondary prevention.  PT OT.    Persistent nausea.  Due to uremia.  Exam stable, KUB nonacute, on bowel regimen, HD for uremia as above.    Constipation.  Bowel regimen.    Generalized weakness - Likely secondary to uremia, hypocalcemia, and metabolic acidosis. Management as above - PT/OT consulted for evaluation   Persistent refusal to take Heparin  for DVT prophylaxis, warned and counseled.   DVT prophylaxis: heparin  injection 5,000 Units Start: 04/30/24 1400 Place and maintain sequential compression device Start: 04/30/24 0049 Heparin  added on 04/30/2024.  Code Status:   Code Status:  Full Code  Family Communication: Discussed with wife at bedside on 04/30/2024  Disposition Plan: Pending clinical improvement after HD initiation and securing outpatient HD chair PT - Follow Up Recommendations: Home health PT OT - Follow Up Recommendations: Home health OT  DME Needs: PT equipment: Rolling walker (2 wheels)     Level of  care: Telemetry  Consultants:  Nephrology  Procedures:  Tunneled hemodialysis catheter placement 12/17  Antimicrobials: None  Subjective:  Patient in bed, appears comfortable, denies any headache, no fever, no chest pain or pressure, no shortness of breath , no abdominal pain but continues to have nausea. No new focal weakness.   Objective: Vitals:   05/01/24 0400 05/01/24 0411 05/01/24 0742 05/01/24 0800  BP: 138/87 138/87 132/86 (!) 147/79  Pulse: 100 94 96 96  Resp: (!) 23 (!) 21 16 16   Temp:  97.8 F (36.6 C)  97.6 F (36.4 C)  TempSrc:  Oral  Oral  SpO2: 94% 97% 97% 95%  Weight:      Height:        Intake/Output Summary (Last 24 hours) at 05/01/2024 0831 Last data filed at 04/30/2024 1833 Gross per 24 hour  Intake --  Output 1500 ml  Net -1500 ml   Filed Weights   04/29/24 1402 04/29/24 1706 04/29/24 2255  Weight: (S) 62.8 kg (S) 62.6 kg 58.6 kg    Examination:  Awake Alert, chronic left-sided weakness from previous stroke, right IJ HD catheter [place].AT,PERRAL Supple Neck, No JVD,   Symmetrical Chest wall movement, Good air movement bilaterally, CTAB RRR,No Gallops, Rubs or new Murmurs,  +ve B.Sounds, Abd Soft, No tenderness,   No Cyanosis, Clubbing or edema     Data Reviewed: I have personally reviewed following labs and imaging studies   Data Review:   Patient Lines/Drains/Airways Status     Active Line/Drains/Airways     Name Placement date Placement time Site Days   Peripheral IV 04/30/24 22 G 1.75 Anterior;Left Forearm 04/30/24  1007  Forearm  1   Hemodialysis Catheter Right Internal jugular Double lumen Permanent (Tunneled) 04/29/24  0956  Internal jugular  2   Urethral Catheter RN who placed Foley did not chart it Double-lumen 04/28/24  --  Double-lumen  3   Wound 04/29/24 1401 Vascular Ulcer Tibial Left;Posterior;Proximal 04/29/24  1401  Tibial  2   Wound 04/29/24 2255 Other (Comment) Toe (Comment  which one) Anterior;Left 04/29/24  2255   Toe (Comment  which one)  2   Wound 04/29/24 2255 Other (Comment) Wrist Lower;Right;Lateral 04/29/24  2255  Wrist  2             Inpatient Medications  Scheduled Meds:  aspirin  EC  81 mg Oral Daily   atorvastatin   80 mg Oral q1800   azaTHIOprine   50 mg Oral Daily   bisacodyl   10 mg Rectal Once   Chlorhexidine  Gluconate Cloth  6 each Topical Q0600   docusate sodium   200 mg Oral BID   heparin  injection (subcutaneous)  5,000 Units Subcutaneous Q8H   metoprolol  tartrate  25 mg Oral BID   polyethylene glycol  17 g Oral Daily   predniSONE   5 mg Oral Q breakfast   Continuous Infusions:  promethazine  (PHENERGAN ) injection (IM or IVPB) 25 mg (04/30/24 1013)   PRN Meds:.acetaminophen  **OR** acetaminophen , metoprolol  tartrate, ondansetron  **OR** ondansetron  (ZOFRAN ) IV, oxyCODONE -acetaminophen  **AND** oxyCODONE , promethazine  (PHENERGAN ) injection (IM or IVPB), tiZANidine   DVT Prophylaxis  heparin  injection 5,000 Units Start: 04/30/24 1400 Place and maintain sequential  compression device Start: 04/30/24 0049   Recent Labs  Lab 04/28/24 1538 04/28/24 2156 04/29/24 0234 04/30/24 0111  WBC 5.6 5.6 7.1 6.4  HGB 14.0 10.4* 9.5* 9.6*  HCT RESULTS UNAVAILABLE DUE TO INTERFERING SUBSTANCE 28.0* 26.1* 25.8*  PLT 70* 75* 73* 70*  MCV RESULTS UNAVAILABLE DUE TO INTERFERING SUBSTANCE 81.4 82.9 81.4  MCH RESULTS UNAVAILABLE DUE TO INTERFERING SUBSTANCE 30.2 30.2 30.3  MCHC RESULTS UNAVAILABLE DUE TO INTERFERING SUBSTANCE 37.1* 36.4* 37.2*  RDW RESULTS UNAVAILABLE DUE TO INTERFERING SUBSTANCE 13.1 13.1 13.2    Recent Labs  Lab 04/28/24 1538 04/28/24 2156 04/29/24 0234 04/30/24 0111  NA 129*  --  131* 135  K 3.6  --  3.8 3.0*  CL 87*  --  91* 92*  CO2 10*  --  7* 14*  ANIONGAP 32*  --  33* 29*  GLUCOSE 161*  --  97 90  BUN 217*  --  217* 110*  CREATININE 10.60* 10.40* 10.40* 6.13*  AST 20  --   --   --   ALT 20  --   --   --   ALKPHOS 77  --   --   --   BILITOT 0.6  --   --    --   ALBUMIN 4.4  --  4.0 3.9  INR 1.1  --   --  1.0  TSH  --  1.990 1.780  --   PHOS  --   --  14.5* 9.3*  CALCIUM  6.5*  --  6.4* 7.3*      Recent Labs  Lab 04/28/24 1538 04/28/24 2156 04/29/24 0234 04/30/24 0111  INR 1.1  --   --  1.0  TSH  --  1.990 1.780  --   CALCIUM  6.5*  --  6.4* 7.3*    --------------------------------------------------------------------------------------------------------------- Lab Results  Component Value Date   CHOL 151 05/25/2021   HDL 47 05/25/2021   LDLCALC 81 05/25/2021   TRIG 133 05/25/2021   CHOLHDL 3.2 05/25/2021    Lab Results  Component Value Date   HGBA1C 5.8 (A) 04/23/2024   Recent Labs    04/28/24 1538 04/28/24 2156 04/29/24 0234  TSH  --    < > 1.780  FREET4 1.52  --   --    < > = values in this interval not displayed.   No results for input(s): VITAMINB12, FOLATE, FERRITIN, TIBC, IRON, RETICCTPCT in the last 72 hours. ------------------------------------------------------------------------------------------------------------------ Cardiac Enzymes No results for input(s): CKMB, TROPONINI, MYOGLOBIN in the last 168 hours.  Invalid input(s): CK  Micro Results Recent Results (from the past 240 hours)  Resp panel by RT-PCR (RSV, Flu A&B, Covid) Anterior Nasal Swab     Status: None   Collection Time: 04/28/24  5:14 PM   Specimen: Anterior Nasal Swab  Result Value Ref Range Status   SARS Coronavirus 2 by RT PCR NEGATIVE NEGATIVE Final   Influenza A by PCR NEGATIVE NEGATIVE Final   Influenza B by PCR NEGATIVE NEGATIVE Final    Comment: (NOTE) The Xpert Xpress SARS-CoV-2/FLU/RSV plus assay is intended as an aid in the diagnosis of influenza from Nasopharyngeal swab specimens and should not be used as a sole basis for treatment. Nasal washings and aspirates are unacceptable for Xpert Xpress SARS-CoV-2/FLU/RSV testing.  Fact Sheet for Patients: bloggercourse.com  Fact Sheet  for Healthcare Providers: seriousbroker.it  This test is not yet approved or cleared by the [place]  FDA and has been authorized for detection and/or diagnosis of SARS-CoV-2  by FDA under an Emergency Use Authorization (EUA). This EUA will remain in effect (meaning this test can be used) for the duration of the COVID-19 declaration under Section 564(b)(1) of the Act, 21 U.S.C. section 360bbb-3(b)(1), unless the authorization is terminated or revoked.     Resp Syncytial Virus by PCR NEGATIVE NEGATIVE Final    Comment: (NOTE) Fact Sheet for Patients: bloggercourse.com  Fact Sheet for Healthcare Providers: seriousbroker.it  This test is not yet approved or cleared by the [place]  FDA and has been authorized for detection and/or diagnosis of SARS-CoV-2 by FDA under an Emergency Use Authorization (EUA). This EUA will remain in effect (meaning this test can be used) for the duration of the COVID-19 declaration under Section 564(b)(1) of the Act, 21 U.S.C. section 360bbb-3(b)(1), unless the authorization is terminated or revoked.  Performed at [facility], [address] [place], [place] [address]     Radiology Reports  DG Abd Portable 1V Result Date: 04/30/2024 CLINICAL DATA:  [id number] Constipated [id number] EXAM: PORTABLE ABDOMEN - 1 VIEW COMPARISON:  April 28, 2024 FINDINGS: Nonobstructive bowel gas pattern.Small volume fecal loading in the transverse colonno pneumoperitoneum. No organomegaly or radiopaque calculi. No acute fracture or destructive lesion. Aortoiliac atherosclerosis. IMPRESSION: Nonobstructive bowel gas pattern. Small volume fecal loading in the transverse colon Electronically Signed   By: Rogelia Myers M.D.   On: 04/30/2024 10:11   IR TUNNELED CENTRAL VENOUS CATH [facility] W IMG Result Date: 04/29/2024 INDICATION: 63 year old with end-stage renal disease and needs hemodialysis.  EXAM: FLUOROSCOPIC AND ULTRASOUND GUIDED PLACEMENT OF A TUNNELED DIALYSIS CATHETER Physician: Juliene SAUNDERS. Philip, MD MEDICATIONS: Ancef  2 g; The antibiotic was administered within an appropriate time interval prior to skin puncture. ANESTHESIA/SEDATION: Moderate (conscious) sedation was employed during this procedure. A total of Versed  0.5 mg and fentanyl  25 mcg was administered intravenously at the order of the provider performing the procedure. Total intra-service moderate sedation time: 20 minutes. Patient's level of consciousness and vital signs were monitored continuously by radiology nurse throughout the procedure under the supervision of the provider performing the procedure. FLUOROSCOPY TIME:  Radiation Exposure Index (as provided by the fluoroscopic device): 1 mGy Kerma COMPLICATIONS: None immediate. PROCEDURE: Informed consent was obtained for placement of a tunneled dialysis catheter. The patient was placed supine on the interventional table. Ultrasound confirmed a patent right internal jugular vein. Ultrasound image obtained for documentation. The right neck and chest was prepped and draped in a sterile fashion. Maximal barrier sterile technique was utilized including caps, mask, sterile gowns, sterile gloves, sterile drape, hand hygiene and skin antiseptic. The right neck was anesthetized with 1% lidocaine . A small incision was made with #11 blade scalpel. A 21 gauge needle directed into the right internal jugular vein with ultrasound guidance. A micropuncture dilator set was placed. A 19 cm tip to cuff Palindrome catheter was selected. The skin below the right clavicle was anesthetized and a small incision was made with an #11 blade scalpel. A subcutaneous tunnel was formed to the vein dermatotomy site. The catheter was brought through the tunnel. The vein dermatotomy site was dilated to accommodate a peel-away sheath over a wire. The catheter was placed through the peel-away sheath and directed into the  central venous structures. The tip of the catheter was placed at superior cavoatrial junction with fluoroscopy. Fluoroscopic images were obtained for documentation. Both lumens were found to aspirate and flush well. The proper amount of heparin  was flushed in both lumens. The vein dermatotomy site was closed  using a single layer of absorbable suture and Dermabond. Gel-Foam placed in subcutaneous tract. The catheter was secured to the skin using Prolene suture. IMPRESSION: Successful placement of a right jugular tunneled dialysis catheter using ultrasound and fluoroscopic guidance. Electronically Signed   By: Juliene Balder M.D.   On: 04/29/2024 10:49      Signature  -   Lavada Stank M.D on 05/01/2024 at 8:31 AM   -  To page go to [email or web address]     "

## 2024-05-01 NOTE — Plan of Care (Signed)

## 2024-05-01 NOTE — Plan of Care (Signed)
" °  Problem: Clinical Measurements: Goal: Ability to maintain clinical measurements within normal limits will improve Outcome: Progressing Goal: Will remain free from infection Outcome: Progressing Goal: Cardiovascular complication will be avoided Outcome: Progressing   Problem: Activity: Goal: Risk for activity intolerance will decrease Outcome: Progressing   Problem: Elimination: Goal: Will not experience complications related to urinary retention Outcome: Progressing   Problem: Skin Integrity: Goal: Risk for impaired skin integrity will decrease Outcome: Progressing   "

## 2024-05-01 NOTE — Progress Notes (Signed)
 Initial Nutrition Assessment  DOCUMENTATION CODES:   Severe malnutrition in context of chronic illness  INTERVENTION:  Add Nepro Shake po BID, each supplement provides 425 kcal and 19 grams protein   Add Magic cup TID with meals, each supplement provides 290 kcal and 9 grams of protein   Recommend liberalizing diet when lab trends improve and baseline labs established s/p regular HD txs  Recommend liberalizing fluid restriction  Continue modifying bowel regimen until improvement noted  NUTRITION DIAGNOSIS:  Severe Malnutrition related to chronic illness as evidenced by severe muscle depletion, severe fat depletion.  GOAL:  Patient will meet greater than or equal to 90% of their needs  MONITOR:  PO intake, Supplement acceptance, Labs, Weight trends  REASON FOR ASSESSMENT:   Consult Diet education, Assessment of nutrition requirement/status, Other (Comment) (new HD)  ASSESSMENT:   Pt with PMH significant for: CKD V r/t ANCA vasculitis, HTN, nonischemic cardiomyopathy, CHF. Admitted for CKD V with progression to ESRD and symptomatic uremia. Now starting hemodialysis.  Met with patient and his wife at bedside. He had just returned from dialysis and was feeling very tired/weak. Patient's wife provided most of his nutrition-related history.   Stated his appetite was at baseline up until a few days prior to admission when he began experiencing typical symptoms of uremia: nausea, food aversion, dysgeusia. Suspect he has not been meeting his calorie/protein needs for some time as evidenced by his muscle and fat depletions on exam   Wife initially indicating that they did not need diet education as he had been on dialysis in the past. Upon further questioning, appears he had been on dialysis in 2020 from March to July and subsequently regained function. Wife reports they eat food from their garden and she cooks all of his meals with whole foods. Discussed importance of protein intake at  each meal and snack. Provided examples of how to maximize protein intake throughout the day. Discussed need for fluid restriction with dialysis, importance of minimizing weight gain between HD treatments, and renal-friendly beverage options.  Discussed tailoring patient's diet based off his lab trends and how RD at his outpatient clinic would educate around specifics once his baseline is established. Encouraged pt to discuss specific diet questions/concerns with outpatient RD. Patient's wife also stating they are interested in peritoneal dialysis. Discussed differences in diet approach based on modality and, again, how this would be tailored to his individual health history and lab trends. Education added to AVS for review.   Admit Weight: 62.8 kg Current Weight: 65 kg Last HD tx: 12/19 UF removed: 890 mL  Per chart review has shown 5.4% weight loss in last year. No weight hx between that time frame to assess. No edema on exam and with good UOP. Discussed with wife that UOP does not always indicate desirable renal function as kidneys are aiding in managing fluid balance, but not filtering blood. Educated regarding importance of adhering to dialysis prescription in both frequency and duration to preserve his residual kidney function which will likely allow for a more liberalized diet in regard to foods and beverages. No edema on exam. No BM this admission. She reports he has received suppository. Discussed how sluggish bowels can also impact lab trends.   Drains/Lines: R internal jugular: hemodialysis catheter, double lumen Foley catheter UOP: 1500 mL x24 hours  Meds: azathioprine , bisacodyl  suppository, PhosLo, Colace, Miralax , prednisone , IV phenergan  PRN  BUN/Crt down trending with dialysis. Calcium  notably low even after correcting. On calcium  based binder. PTH high,  but within normal range for HD patient (150-600). Per nephrology, vitamin D analog on HD days to aid in increasing calcium   absorption,. Will also likely increase PHOS absorption, however phosphorus binder also started and working on bowel regimen. Both will likely contribute to desirable phosphorus trend. Discussed with his wife at bedside and educated regarding appropriate timing of phosphorus binders.   Labs from 12/18 reviewed: Na+ 135 (wdl) K+ 3.0 (L) Calcium  6.4>7.3 (L) PHOS 9.3 (H) Pth 212 (wdl for HD pt: 150-600) BUN 110 Crt 6.13  CBGs 90-97 x24 hours A1c  5.8 (04/2024)    NUTRITION - FOCUSED PHYSICAL EXAM:  Flowsheet Row Most Recent Value  Orbital Region Moderate depletion  Upper Arm Region Severe depletion  Thoracic and Lumbar Region Severe depletion  Buccal Region Moderate depletion  Temple Region Mild depletion  Clavicle Bone Region Severe depletion  Clavicle and Acromion Bone Region Severe depletion  Scapular Bone Region Severe depletion  Dorsal Hand Moderate depletion  Patellar Region Severe depletion  Anterior Thigh Region Severe depletion  Posterior Calf Region Severe depletion  Edema (RD Assessment) None  Hair Reviewed  Eyes Reviewed  Mouth Reviewed  Skin Reviewed  Nails Reviewed    Diet Order:   Diet Order             Diet renal with fluid restriction Fluid restriction: 1200 mL Fluid; Room service appropriate? Yes; Fluid consistency: Thin  Diet effective now             EDUCATION NEEDS:   Education needs have been addressed  Skin:  Skin Assessment: Reviewed RN Assessment  Last BM:  PTA  Height:  Ht Readings from Last 1 Encounters:  04/29/24 6' 1 (1.854 m)   Weight:  Wt Readings from Last 1 Encounters:  05/01/24 65 kg   Ideal Body Weight:  83.6 kg  BMI:  Body mass index is 18.91 kg/m.  Estimated Nutritional Needs:   Kcal:  1800-2000  Protein:  85-100g  Fluid:  1L + UOP  Blair Deaner MS, RD, LDN Registered Dietitian Clinical Nutrition RD Inpatient Contact Info in Amion

## 2024-05-01 NOTE — Progress Notes (Signed)
 " [facility] Progress Note    Assessment/ Plan:   # CKD stage V, now ESRD - s/p TDC placement 12/17 with IR. Slow start protocol to prevent dialysis dysequilibrium syndrome. HD#1 12/17. Hold HD 12/18 given rapid drop in BUN. HD#2 today. #3 planned for tomorrow--will request HD in chair. - CLIP: [facility] MWF -holding off on VVS consult for perm access creation. Had a lengthy convo with wife in regards to home therapies/PD. They are VERY interested in this and would like to move forward with this after discharge, being arranged as OP   # Metabolic acidosis - improving with HD   # Hypotension  - resolved   # Weakness, N/V - May be secondary to uremia as well as hypocalcemia and metabolic acidosis - hopeful for improvement with HD -PT/OT   # Hypocalcemia  - Secondary to advanced CKD  - PTH pending -cal improving   # Hyponatremia  - Secondary to impaired free water excretion  - resolved with HD   # Thrombocytopenia -work up per primary service  # ANCA vasculitis -doubt his renal failure is secondary to ANCA flare. A biopsy will not add much utility in his particular case given how diffusely atrophied his kidneys are on imaging. Also not much hematuria on urine sediment from 12/16 -c/w imuran  and pred.   #Secondary hyperparathyroidism -PTH 212, with hyperphos -starting phoslo  tidac, calcitriol  0.12mcg on HD days  Subjective:   Patient seen and examined on dialysis. He reports that his nausea is better. Tolerating treatment. Still feels weak. Did discuss with wife in his room earlier.   Objective:   BP (!) 150/85   Pulse 88   Temp (!) 97.5 F (36.4 C)   Resp (!) 21   Ht 6' 1 (1.854 m)   Wt 66 kg   SpO2 98%   BMI 19.20 kg/m   Intake/Output Summary (Last 24 hours) at 05/01/2024 1146 Last data filed at 04/30/2024 1833 Gross per 24 hour  Intake --  Output 650 ml  Net -650 ml   Weight change:   Physical Exam: Gen: chronically ill appearing, NAD,  laying flat CVS: RRR Resp: CTA BL Abd: soft, nt/nd Ext: no edema Neuro: awake, alert, intermittent myoclonic jerks Dialysis access: [facility]  Imaging: DG Abd Portable 1V Result Date: 04/30/2024 CLINICAL DATA:  810073 Constipated 810073 EXAM: PORTABLE ABDOMEN - 1 VIEW COMPARISON:  April 28, 2024 FINDINGS: Nonobstructive bowel gas pattern.Small volume fecal loading in the transverse colonno pneumoperitoneum. No organomegaly or radiopaque calculi. No acute fracture or destructive lesion. Aortoiliac atherosclerosis. IMPRESSION: Nonobstructive bowel gas pattern. Small volume fecal loading in the transverse colon Electronically Signed   By: Rogelia Myers M.D.   On: 04/30/2024 10:11    Labs: BMET Recent Labs  Lab 04/28/24 1538 04/28/24 2156 04/29/24 0234 04/30/24 0111  NA 129*  --  131* 135  K 3.6  --  3.8 3.0*  CL 87*  --  91* 92*  CO2 10*  --  7* 14*  GLUCOSE 161*  --  97 90  BUN 217*  --  217* 110*  CREATININE 10.60* 10.40* 10.40* 6.13*  CALCIUM  6.5*  --  6.4* 7.3*  PHOS  --   --  14.5* 9.3*   CBC Recent Labs  Lab 04/28/24 1538 04/28/24 2156 04/29/24 0234 04/30/24 0111  WBC 5.6 5.6 7.1 6.4  HGB 14.0 10.4* 9.5* 9.6*  HCT RESULTS UNAVAILABLE DUE TO INTERFERING SUBSTANCE 28.0* 26.1* 25.8*  MCV RESULTS UNAVAILABLE DUE TO  INTERFERING SUBSTANCE 81.4 82.9 81.4  PLT 70* 75* 73* 70*    Medications:     aspirin  EC  81 mg Oral Daily   atorvastatin   80 mg Oral q1800   azaTHIOprine   50 mg Oral Daily   bisacodyl   10 mg Rectal Once   Chlorhexidine  Gluconate Cloth  6 each Topical Q0600   docusate sodium   200 mg Oral BID   heparin  injection (subcutaneous)  5,000 Units Subcutaneous Q8H   metoprolol  tartrate  25 mg Oral BID   polyethylene glycol  17 g Oral Daily   predniSONE   5 mg Oral Q breakfast     [name], MD [facility] 05/01/2024, 11:46 AM   "

## 2024-05-01 NOTE — Consult Note (Signed)
 "  ORTHOPAEDIC CONSULTATION  REQUESTING PHYSICIAN: Dennise Lavada POUR, MD  Chief Complaint: Left leg chronic ulcer.  HPI: Jeremy Grant is a 63 y.o. male who presents with acute renal failure and altered mental status.  Past Medical History:  Diagnosis Date   Arthritis    CHF (congestive heart failure) (HCC)    Combined systolic and diastolic cardiac dysfunction    Echo 11/03/2013 EF 20%, grade 3 diastolic dysfunction   Hypertension    NICM (nonischemic cardiomyopathy) (HCC)    a. L/RHC (11/05/13): RA: 3, RV 52/5, PA 49/19 (31), PCWP 10, AO 166/93, PA 67%, Fick CO/CI: 5.71/2.97, Lmain: normal, LAD: large, without signficant dz, first diagonal has 20% dz at ostium, LCx: normal, RCA: 30% stenosis at the bifurcation of PDA and PLOM   Stroke [facility])    Past Surgical History:  Procedure Laterality Date   BIOPSY  01/31/2019   Procedure: BIOPSY;  Surgeon: Elicia Claw, MD;  Location: [facility] ENDOSCOPY;  Service: Gastroenterology;;   CATARACT EXTRACTION Bilateral 05/2021   ESOPHAGOGASTRODUODENOSCOPY N/A 01/31/2019   Procedure: ESOPHAGOGASTRODUODENOSCOPY (EGD);  Surgeon: Elicia Claw, MD;  Location: [facility] ENDOSCOPY;  Service: Gastroenterology;  Laterality: N/A;   I & D EXTREMITY Left 05/05/2023   Procedure: IRRIGATION AND DEBRIDEMENT LEFT CALF;  Surgeon: Harden Jerona GAILS, MD;  Location: [facility] OR;  Service: Orthopedics;  Laterality: Left;   I & D EXTREMITY Left 05/09/2023   Procedure: IRRIGATION AND DEBRIDEMENT OF LEG;  Surgeon: Georgina Ozell LABOR, MD;  Location: [facility] OR;  Service: Orthopedics;  Laterality: Left;   IR FLUORO GUIDE CV LINE RIGHT  07/25/2018   IR TUNNELED CENTRAL VENOUS CATH PLC W IMG  04/29/2024   IR US  GUIDE VASC ACCESS RIGHT  07/25/2018   LEFT AND RIGHT HEART CATHETERIZATION WITH CORONARY ANGIOGRAM N/A 11/05/2013   Procedure: LEFT AND RIGHT HEART CATHETERIZATION WITH CORONARY ANGIOGRAM;  Surgeon: Peter M Jordan, MD;  Location: [facility] CATH LAB;  Service: Cardiovascular;  Laterality:  N/A;   RIGHT HEART CATHETERIZATION N/A 01/14/2014   Procedure: RIGHT HEART CATH;  Surgeon: Ezra GORMAN Shuck, MD;  Location: [facility] CATH LAB;  Service: Cardiovascular;  Laterality: N/A;   Social History   Socioeconomic History   Marital status: Married    Spouse name: BRENDA   Number of children: 0   Years of education: 12   Highest education level: 12th grade  Occupational History   Occupation: cabin crew  Tobacco Use   Smoking status: Never   Smokeless tobacco: Never  Substance and Sexual Activity   Alcohol use: Yes    Alcohol/week: 3.0 standard drinks of alcohol    Types: 3 Glasses of wine per week   Drug use: No   Sexual activity: Not Currently    Birth control/protection: None  Other Topics Concern   Not on file  Social History Narrative   Lives with wife at home with dog Abbie.     Social Drivers of Health   Tobacco Use: Low Risk (04/28/2024)   Patient History    Smoking Tobacco Use: Never    Smokeless Tobacco Use: Never    Passive Exposure: Not on file  Financial Resource Strain: Not on file  Food Insecurity: No Food Insecurity (04/29/2024)   Epic    Worried About Programme Researcher, Broadcasting/film/video in the Last Year: Never true    Ran Out of Food in the Last Year: Never true  Transportation Needs: No Transportation Needs (04/29/2024)   Epic    Lack of Transportation (Medical): No  Lack of Transportation (Non-Medical): No  Physical Activity: Not on file  Stress: Not on file  Social Connections: Not on file  Depression (EYV7-0): Not on file  Alcohol Screen: Not on file  Housing: Low Risk (04/29/2024)   Epic    Unable to Pay for Housing in the Last Year: No    Number of Times Moved in the Last Year: 0    Homeless in the Last Year: No  Utilities: Not At Risk (04/29/2024)   Epic    Threatened with loss of utilities: No  Health Literacy: Not on file   Family History  Problem Relation Age of Onset   Heart attack Father 53   Heart disease Father    Arrhythmia Father     Hypertension Brother    Hypertension Brother    - negative except otherwise stated in the family history section Allergies[1] Prior to Admission medications  Medication Sig Start Date End Date Taking? Authorizing Provider  acetaminophen  (TYLENOL ) 325 MG tablet Take 2 tablets (650 mg total) by mouth every 6 (six) hours as needed for moderate pain. 02/03/19  Yes Angiulli, Toribio PARAS, PA-C  amLODipine  (NORVASC ) 10 MG tablet Take 1 tablet (10 mg total) by mouth daily. 02/04/19  Yes Angiulli, Toribio PARAS, PA-C  aspirin  EC 81 MG EC tablet Take 1 tablet (81 mg total) by mouth daily. 02/04/19  Yes Angiulli, Toribio PARAS, PA-C  atorvastatin  (LIPITOR ) 80 MG tablet Take 1 tablet (80 mg total) by mouth daily at 6 PM. 02/17/19 05/03/24 Yes Debby Fidela CROME, NP  azaTHIOprine  (IMURAN ) 50 MG tablet Take 1 tablet (50 mg total) by mouth daily. 02/04/19  Yes Angiulli, Toribio PARAS, PA-C  carvedilol  (COREG ) 12.5 MG tablet Take 12.5 mg by mouth 2 (two) times daily with a meal.   Yes [provider]  doxazosin  (CARDURA ) 8 MG tablet Take 8 mg by mouth 2 (two) times daily. Increased to 4 mg 2x/day per pt conversation with nephrologist   Yes [provider]  polyethylene glycol powder (GLYCOLAX /MIRALAX ) 17 GM/SCOOP powder Take 1 capful (17 g) by mouth daily. 05/10/23  Yes Arellano Zameza, Priscila, MD  predniSONE  (DELTASONE ) 5 MG tablet Take 5 mg by mouth daily with breakfast.   Yes [provider]  tiZANidine  (ZANAFLEX ) 4 MG tablet Take 1 tablet (4 mg total) by mouth every 8 (eight) hours as needed for muscle spasms. 05/28/22  Yes Whitfield Raisin, NP  HYDROmorphone  (DILAUDID ) 2 MG tablet Take 0.5 tablets (1 mg total) by mouth every 6 (six) hours as needed for up to 12 doses for severe pain (pain score 7-10). Patient not taking: Reported on 04/28/2024 05/10/23   Eben Reyes BROCKS, MD  oxyCODONE -acetaminophen  (PERCOCET) 10-325 MG tablet Take 1 tablet by mouth every 6 (six) hours as needed for pain. Patient not  taking: Reported on 04/28/2024 05/20/23   Harden Jerona GAILS, MD   DG Abd Portable 1V Result Date: 04/30/2024 CLINICAL DATA:  810073 Constipated 810073 EXAM: PORTABLE ABDOMEN - 1 VIEW COMPARISON:  April 28, 2024 FINDINGS: Nonobstructive bowel gas pattern.Small volume fecal loading in the transverse colonno pneumoperitoneum. No organomegaly or radiopaque calculi. No acute fracture or destructive lesion. Aortoiliac atherosclerosis. IMPRESSION: Nonobstructive bowel gas pattern. Small volume fecal loading in the transverse colon Electronically Signed   By: Rogelia Myers M.D.   On: 04/30/2024 10:11   - pertinent xrays, CT, MRI studies were reviewed and independently interpreted  Positive ROS: All other systems have been reviewed and were otherwise negative with the exception of  those mentioned in the HPI and as above.  Physical Exam: General: Alert, no acute distress Psychiatric: Patient is competent for consent with normal mood and affect Lymphatic: No axillary or cervical lymphadenopathy Cardiovascular: No pedal edema Respiratory: No cyanosis, no use of accessory musculature GI: No organomegaly, abdomen is soft and non-tender    Images:  @ENCIMAGES @  Labs:  Lab Results  Component Value Date   HGBA1C 5.8 (A) 04/23/2024   HGBA1C 6.1 (H) 05/04/2023   HGBA1C 5.4 01/12/2019    Lab Results  Component Value Date   ALBUMIN 3.9 04/30/2024   ALBUMIN 4.0 04/29/2024   ALBUMIN 4.4 04/28/2024        Latest Ref Rng & Units 04/30/2024    1:11 AM 04/29/2024    2:34 AM 04/28/2024    9:56 PM  CBC EXTENDED  WBC 4.0 - 10.5 K/uL 6.4  7.1  5.6   RBC 4.22 - 5.81 MIL/uL 3.17  3.15  3.44   Hemoglobin 13.0 - 17.0 g/dL 9.6  9.5  89.5   HCT 60.9 - 52.0 % 25.8  26.1  28.0   Platelets 150 - 400 K/uL 70  73  75     Neurologic: Patient does not have protective sensation bilateral lower extremities.   MUSCULOSKELETAL:   Skin: Examination the wound on the lateral left leg has flat granulation  tissue.  There is no ascending cellulitis.  White cell count 6.4.  BUN on admission greater than 200 currently approximately 100.  Creatinine greater than 10 on admission currently approximately 6  Assessment: Assessment acute renal failure with chronic ulceration lateral aspect of the left leg.  Plan: Plan: Continue Vashe dressing changes I will follow-up in the office as an outpatient.  Thank you for the consult and the opportunity to see Mr. Benedicto Capozzi, MD [facility] [phone] 4:40 PM         [1]  Allergies Allergen Reactions   Hydralazine  Hcl     Almost killed him per wife   "

## 2024-05-01 NOTE — Progress Notes (Signed)
 Received patient in bed to unit.  Alert and oriented.  Informed consent signed and in chart.   TX duration:2.5 Hours   Patient tolerated well.  Transported back to the room  Alert, without acute distress.  Hand-off given to patient's nurse.   Access used: Catheter Access issues: NA  Total UF removed: 890 ML Medication(s) given: 5mg  of metoprolol   Post HD weight: 65 KG    05/01/24 1329  Vitals  Temp 97.7 F (36.5 C)  BP 130/85  Pulse Rate 94  Resp 19  Weight 65 kg  Type of Weight Post-Dialysis  Oxygen Therapy  SpO2 97 %  O2 Device Room Air  During Treatment Monitoring  Blood Flow Rate (mL/min) 0 mL/min  Arterial Pressure (mmHg) -3.84 mmHg  Venous Pressure (mmHg) 24.24 mmHg  TMP (mmHg) 9.29 mmHg  Ultrafiltration Rate (mL/min) 0 mL/min  Dialysate Flow Rate (mL/min) 300 ml/min  Dialysate Potassium Concentration 4  Dialysate Calcium  Concentration 2.5  Duration of HD Treatment -hour(s) 2.5 hour(s)  Cumulative Fluid Removed (mL) per Treatment  890.68  HD Safety Checks Performed Yes  Intra-Hemodialysis Comments Tx completed  Post Treatment  Dialyzer Clearance Lightly streaked  Liters Processed 37.5  Fluid Removed (mL) 900 mL  Tolerated HD Treatment Yes  Hemodialysis Catheter Right Internal jugular Double lumen Permanent (Tunneled)  Placement Date/Time: 04/29/24 0956   Placed prior to admission: No  Serial / Lot #: [id number]  Expiration Date: 10/11/28  Time Out: Correct patient;Correct site;Correct procedure  Maximum sterile barrier precautions: Hand hygiene;Large sterile sheet;C...  Site Condition No complications  Blue Lumen Status Heparin  locked  Red Lumen Status Heparin  locked  Catheter fill solution Heparin  1000 units/ml  Catheter fill volume (Arterial) 1.6 cc  Catheter fill volume (Venous) 1.6  Dressing Type Transparent  Dressing Status Antimicrobial disc/dressing in place  Drainage Description None  Dressing Change Due 05/06/24  Post treatment catheter  status Capped and Clamped    [facility]

## 2024-05-01 NOTE — Procedures (Signed)
 I was present at this dialysis session. I have reviewed the session itself and made appropriate changes.   Filed Weights   04/29/24 1706 04/29/24 2255 05/01/24 1030  Weight: (S) 62.6 kg 58.6 kg 66 kg    Recent Labs  Lab 04/30/24 0111  NA 135  K 3.0*  CL 92*  CO2 14*  GLUCOSE 90  BUN 110*  CREATININE 6.13*  CALCIUM  7.3*  PHOS 9.3*    Recent Labs  Lab 04/28/24 2156 04/29/24 0234 04/30/24 0111  WBC 5.6 7.1 6.4  HGB 10.4* 9.5* 9.6*  HCT 28.0* 26.1* 25.8*  MCV 81.4 82.9 81.4  PLT 75* 73* 70*    Scheduled Meds:  aspirin  EC  81 mg Oral Daily   atorvastatin   80 mg Oral q1800   azaTHIOprine   50 mg Oral Daily   bisacodyl   10 mg Rectal Once   Chlorhexidine  Gluconate Cloth  6 each Topical Q0600   docusate sodium   200 mg Oral BID   heparin  injection (subcutaneous)  5,000 Units Subcutaneous Q8H   metoprolol  tartrate  25 mg Oral BID   polyethylene glycol  17 g Oral Daily   predniSONE   5 mg Oral Q breakfast   Continuous Infusions:  promethazine  (PHENERGAN ) injection (IM or IVPB) 25 mg (04/30/24 1013)   PRN Meds:.acetaminophen  **OR** acetaminophen , metoprolol  tartrate, ondansetron  **OR** ondansetron  (ZOFRAN ) IV, oxyCODONE -acetaminophen  **AND** oxyCODONE , promethazine  (PHENERGAN ) injection (IM or IVPB), tiZANidine    [name], MD [facility] 05/01/2024, 11:46 AM

## 2024-05-01 NOTE — Progress Notes (Signed)
 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    PT Comments  Pt continues to demonstrate limited tolerance due to weakness, fatigue, and elevated HR. Pt does well with bed mobility, using bed rails as needed and is steady with sitting EOB. Pt unsteady throughout ambulation with SPC and fatigues quickly. Pt returned to bed for seated rest break prior to ambulating for a second bout over to the chair. Seated exercises performed and pt tolerated well with L > R weakness noted. Pt would benefit from continued PT services focused on balance, strength, and progressing gait to promote improved activity tolerance.     If plan is discharge home, recommend the following: A little help with walking and/or transfers;A little help with bathing/dressing/bathroom;Assistance with cooking/housework;Assist for transportation;Help with stairs or ramp for entrance   Can travel by private vehicle        Equipment Recommendations  Rolling walker (2 wheels);BSC/3in1    Recommendations for Other Services       Precautions / Restrictions Precautions Precautions: Fall Recall of Precautions/Restrictions: Intact Restrictions Weight Bearing Restrictions Per Provider Order: No     Mobility  Bed Mobility Overal bed mobility: Modified Independent             General bed mobility comments: Uses bed rails as needed, assist for line management.    Transfers Overall transfer level: Needs assistance Equipment used: Straight cane Transfers: Sit to/from Stand Sit to Stand: Contact guard assist, Min assist           General transfer comment: Pt unsteady  upon standing with SPC. Assist needed for balance upon standing. Does well with hand placement without cues. STS x3 completed throughout session, increased assist needed each attempt due to fatigue.    Ambulation/Gait Ambulation/Gait assistance: Min assist Gait Distance (Feet): 8 Feet (8'x2 plus 10') Assistive device: Straight cane Gait Pattern/deviations: Step-through pattern, Decreased step length - right, Decreased step length - left, Drifts right/left, Trunk flexed, Narrow base of support, Shuffle   Gait velocity interpretation: <1.31 ft/sec, indicative of household ambulator   General Gait Details: Pt with one bout of LOB when returning to bed at end of session, requiring modA to correct. Pt demonstrates appropriate use of SPC. Tolerating short distances at this time due to fatigue and elevated HR.   Stairs             Wheelchair Mobility     Tilt Bed    Modified Rankin (Stroke Patients Only)       Balance Overall balance assessment: Needs assistance Sitting-balance support: No upper extremity supported Sitting balance-Leahy Scale: Good Sitting balance - Comments: No sitting balance concerns   Standing balance support: Single extremity supported Standing balance-Leahy Scale: Fair Standing balance comment: Unsteady throughout due to weakness and fatigue. One instance of LOB at end of session, pt unable to correct without assist.                            Communication Communication Communication: No apparent difficulties  Cognition Arousal: Alert Behavior During Therapy: WFL for tasks assessed/performed   PT - Cognitive impairments: No apparent impairments  Following commands: Intact      Cueing Cueing Techniques: Verbal cues, Tactile cues, Visual cues  Exercises General Exercises - Lower Extremity Ankle Circles/Pumps: AROM, Both, 10 reps, Seated, Strengthening Long Arc Quad: AROM, Strengthening, Both, 10  reps, Seated Hip ABduction/ADduction: AROM, Both, 10 reps, Seated Hip Flexion/Marching: AROM, Strengthening, Both, 10 reps, Seated    General Comments General comments (skin integrity, edema, etc.): HR 100s-140s with activity, improves at rest. L LE wound dressing C,D,I.      Pertinent Vitals/Pain Pain Assessment Pain Assessment: No/denies pain    Home Living                          Prior Function            PT Goals (current goals can now be found in the care plan section) Acute Rehab PT Goals Patient Stated Goal: No new stated goals during session. Progress towards PT goals: Progressing toward goals    Frequency    Min 2X/week      PT Plan      Co-evaluation              AM-PAC PT 6 Clicks Mobility   Outcome Measure  Help needed turning from your back to your side while in a flat bed without using bedrails?: None Help needed moving from lying on your back to sitting on the side of a flat bed without using bedrails?: None Help needed moving to and from a bed to a chair (including a wheelchair)?: A Little Help needed standing up from a chair using your arms (e.g., wheelchair or bedside chair)?: A Little Help needed to walk in hospital room?: A Little Help needed climbing 3-5 steps with a railing? : Total 6 Click Score: 18    End of Session   Activity Tolerance: Patient limited by fatigue Patient left: in bed;with call bell/phone within reach;with nursing/sitter in room (Pt preparing for transport to dialysis) Nurse Communication: Mobility status PT Visit Diagnosis: Unsteadiness on feet (R26.81);Muscle weakness (generalized) (M62.81)     Time: 9053-8988 PT Time Calculation (min) (ACUTE ONLY): 25 min  Charges:    $Gait Training: 8-22 mins $Therapeutic Exercise: 8-22 mins PT General Charges $$ ACUTE PT VISIT: 1 Visit                     Sabra Morel, PT, DPT  Acute Rehabilitation Services         Office: [phone]       Sabra MARLA Morel 05/01/2024, 4:23 PM

## 2024-05-02 DIAGNOSIS — N186 End stage renal disease: Secondary | ICD-10-CM | POA: Diagnosis not present

## 2024-05-02 LAB — RENAL FUNCTION PANEL
Albumin: 4.3 g/dL (ref 3.5–5.0)
Anion gap: 26 — ABNORMAL HIGH (ref 5–15)
BUN: 80 mg/dL — ABNORMAL HIGH (ref 8–23)
CO2: 19 mmol/L — ABNORMAL LOW (ref 22–32)
Calcium: 8.8 mg/dL — ABNORMAL LOW (ref 8.9–10.3)
Chloride: 90 mmol/L — ABNORMAL LOW (ref 98–111)
Creatinine, Ser: 6.02 mg/dL — ABNORMAL HIGH (ref 0.61–1.24)
GFR, Estimated: 10 mL/min — ABNORMAL LOW
Glucose, Bld: 143 mg/dL — ABNORMAL HIGH (ref 70–99)
Phosphorus: 8.3 mg/dL — ABNORMAL HIGH (ref 2.5–4.6)
Potassium: 3.6 mmol/L (ref 3.5–5.1)
Sodium: 135 mmol/L (ref 135–145)

## 2024-05-02 LAB — CBC
HCT: 31.1 % — ABNORMAL LOW (ref 39.0–52.0)
Hemoglobin: 11.4 g/dL — ABNORMAL LOW (ref 13.0–17.0)
MCH: 30.3 pg (ref 26.0–34.0)
MCHC: 36.7 g/dL — ABNORMAL HIGH (ref 30.0–36.0)
MCV: 82.7 fL (ref 80.0–100.0)
Platelets: 80 K/uL — ABNORMAL LOW (ref 150–400)
RBC: 3.76 MIL/uL — ABNORMAL LOW (ref 4.22–5.81)
RDW: 13.3 % (ref 11.5–15.5)
WBC: 7 K/uL (ref 4.0–10.5)
nRBC: 0 % (ref 0.0–0.2)

## 2024-05-02 LAB — IRON AND TIBC
Iron: 88 ug/dL (ref 45–182)
Saturation Ratios: 52 % — ABNORMAL HIGH (ref 17.9–39.5)
TIBC: 171 ug/dL — ABNORMAL LOW (ref 250–450)
UIBC: 83 ug/dL

## 2024-05-02 LAB — FERRITIN: Ferritin: 1225 ng/mL — ABNORMAL HIGH (ref 24–336)

## 2024-05-02 MED ORDER — HEPARIN SODIUM (PORCINE) 1000 UNIT/ML IJ SOLN
INTRAMUSCULAR | Status: AC
  Filled 2024-05-02: qty 4

## 2024-05-02 MED ORDER — HEPARIN SODIUM (PORCINE) 1000 UNIT/ML IJ SOLN
3200.0000 [IU] | Freq: Once | INTRAMUSCULAR | Status: AC
  Administered 2024-05-02: 3200 [IU]

## 2024-05-02 NOTE — Progress Notes (Signed)
 " PROGRESS NOTE    Jeremy Grant  FMW:[id number] DOB: 12/09/1960 DOA: 04/28/2024 PCP: Burney Darice CROME, MD    Brief Narrative:  63 y.o. male with medical history significant of chronic kidney disease stage V, hypertension, nonischemic cardiomyopathy systolic and diastolic CHF who presented to ED with significantly profound weakness which is ongoing for about 2 to 3 days.  He denied any other complaint such as nausea, vomiting, fever, chills, sweating, chest pain or shortness of breath.  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  He was last seen by outpatien nephrology on 02/26/2024 with a creatinine of 5.81, BUN 112, GFR 10, and K 4.3. He has been on prednisone  5 mg daily and azathioprine  50 mg daily.   In the ED, he was afebrile, HR 70, RR 15, BP 98/59, SpO2 100% on RA.  CBC results initially unclear due to interfering substance reported by lab, later repeated to show hemoglobin 10.4, platelets 75.  CMP showed sodium 129, potassium 3.6, chloride 87, BUN 217, creatinine 10.6, calcium  6.5, anion gap 32.  PT/INR within normal limits.  UA showed small Hgb and 30 protein.  TSH and FT4 within normal limits.  4 Plex PCR negative.  CXR unremarkable.  Nephrology was consulted recommended CT abdomen pelvis which showed diverticulosis and diffuse renal atrophy bilaterally and a single punctate calculus in the right kidney, large stool burden.  Nephrology recommended admission for initiation of hemodialysis.  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 with signs of uremia >> nausea, metabolic acidosis - Nephrology consulted underwent right IJ HD catheter placement, HD started, still needs a few runs as he is still nauseated from uremia, continue HD for acidosis and fluid removal, due for another HD on 05/02/2024 note  patient is having persistent nausea due to uremia.  Hopefully with continued HD symptoms will improve   #Hyperphosphatemia - Secondary to ESRD - Phosphorus 14.5 - Management with HD  #Metabolic acidosis - Likely secondary to ESRD - Receiving LR at 75 mL/h for 12 hours - Further management with HD  # Hypocalcemia - Secondary to ESRD - Received 1 g calcium   # Hyponatremia - Secondary to ESRD and impaired free water excretion - Improved to 131  #Anemia of chronic disease - Baseline hemoglobin 9-10 - Hgb stable at baseline  #Chronic thrombocytopenia - Platelets 73 - No active bleeding - Continue to monitor  # ANCA-positive necrotizing crescentic GN - Continue home Imuran  and prednisone  - Nephrology following  # Hypertension - Low-dose beta-blocker and monitor  # Chronic left leg wound - Chronic, follows with outpatient wound care and Dr. Harden. Wife performs daily wound care at home.  - WOC consulted, Dr Harden following.  # HX of stroke, chronic left-sided weakness.  Aspirin  and statin for secondary prevention.  PT OT.    Persistent nausea.  Due to uremia.  Exam stable, KUB nonacute, on bowel regimen, HD for uremia as above.    Constipation.  resolved after bowel regimen  Generalized weakness - Likely secondary to uremia, hypocalcemia, and metabolic acidosis. Management as above - PT/OT consulted for evaluation   Persistent refusal to take Heparin  for DVT prophylaxis, warned and counseled.   DVT prophylaxis: Place and maintain sequential compression device Start: 05/01/24 1711 heparin  injection 5,000 Units Start: 04/30/24 1400 Place and  maintain sequential compression device Start: 04/30/24 0049 Heparin  added on 04/30/2024.  Code Status:   Code Status: Full Code  Family Communication: Discussed with wife at bedside on 04/30/2024  Disposition Plan: Pending clinical improvement after HD initiation and securing outpatient HD chair PT - Follow Up Recommendations:  Home health PT OT - Follow Up Recommendations: Home health OT  DME Needs: PT equipment: Rolling walker (2 wheels), BSC/3in1     Level of care: Telemetry  Consultants:  Nephrology  Procedures:  Tunneled hemodialysis catheter placement 12/17  Antimicrobials: None  Subjective: Patient in bed, denies any headache, no fever or chills, no chest or abdominal pain, had a BM last night, still nauseated, poor appetite.   Objective: Vitals:   05/02/24 0005 05/02/24 0400 05/02/24 0449 05/02/24 0800  BP: 115/85 (!) 127/50 (!) 146/88 135/89  Pulse: 75 61 74 87  Resp: 16 17 17 16   Temp: (!) 97.4 F (36.3 C)  97.7 F (36.5 C) 97.6 F (36.4 C)  TempSrc: Oral  Oral Oral  SpO2: 96% 94% 95% 96%  Weight:      Height:        Intake/Output Summary (Last 24 hours) at 05/02/2024 9177 Last data filed at 05/01/2024 1700 Gross per 24 hour  Intake 45 ml  Output 1800 ml  Net -1755 ml   Filed Weights   05/01/24 1030 05/01/24 1329 05/01/24 1332  Weight: 66 kg 65 kg 65 kg    Examination:  Awake Alert, chronic left-sided weakness from previous stroke, right IJ HD catheter [place].AT,PERRAL Supple Neck, No JVD,   Symmetrical Chest wall movement, Good air movement bilaterally, CTAB RRR,No Gallops, Rubs or new Murmurs,  +ve B.Sounds, Abd Soft, No tenderness,   No Cyanosis, Clubbing or edema     Data Reviewed: I have personally reviewed following labs and imaging studies   Data Review:   Patient Lines/Drains/Airways Status     Active Line/Drains/Airways     Name Placement date Placement time Site Days   Peripheral IV 04/30/24 22 G 1.75 Anterior;Left Forearm 04/30/24  1007  Forearm  2   Hemodialysis Catheter Right Internal jugular Double lumen Permanent (Tunneled) 04/29/24  0956  Internal jugular  3   Urethral Catheter RN who placed Foley did not chart it Double-lumen 04/28/24  --  Double-lumen  4   Wound 04/29/24 1401 Vascular Ulcer Tibial Left;Posterior;Proximal 04/29/24  1401   Tibial  3   Wound 04/29/24 2255 Other (Comment) Toe (Comment  which one) Anterior;Left 04/29/24  2255  Toe (Comment  which one)  3   Wound 04/29/24 2255 Other (Comment) Wrist Lower;Right;Lateral 04/29/24  2255  Wrist  3             Inpatient Medications  Scheduled Meds:  aspirin  EC  81 mg Oral Daily   atorvastatin   80 mg Oral q1800   azaTHIOprine   50 mg Oral Daily   bisacodyl   10 mg Rectal Once   calcium  acetate  667 mg Oral TID WC   Chlorhexidine  Gluconate Cloth  6 each Topical Q0600   docusate sodium   200 mg Oral BID   feeding supplement (NEPRO CARB STEADY)  237 mL Oral BID BM   heparin  injection (subcutaneous)  5,000 Units Subcutaneous Q8H   metoprolol  tartrate  25 mg Oral BID   polyethylene glycol  17 g Oral Daily   predniSONE   5 mg Oral Q breakfast   Continuous Infusions:  promethazine  (PHENERGAN ) injection (IM or IVPB) 25 mg (04/30/24 1013)   PRN  Meds:.acetaminophen  **OR** acetaminophen , calcitRIOL , metoprolol  tartrate, ondansetron  **OR** ondansetron  (ZOFRAN ) IV, oxyCODONE -acetaminophen  **AND** oxyCODONE , promethazine  (PHENERGAN ) injection (IM or IVPB), tiZANidine   DVT Prophylaxis  Place and maintain sequential compression device Start: 05/01/24 1711 heparin  injection 5,000 Units Start: 04/30/24 1400 Place and maintain sequential compression device Start: 04/30/24 0049   Recent Labs  Lab 04/28/24 1538 04/28/24 2156 04/29/24 0234 04/30/24 0111 05/02/24 0439  WBC 5.6 5.6 7.1 6.4 7.0  HGB 14.0 10.4* 9.5* 9.6* 11.4*  HCT RESULTS UNAVAILABLE DUE TO INTERFERING SUBSTANCE 28.0* 26.1* 25.8* 31.1*  PLT 70* 75* 73* 70* 80*  MCV RESULTS UNAVAILABLE DUE TO INTERFERING SUBSTANCE 81.4 82.9 81.4 82.7  MCH RESULTS UNAVAILABLE DUE TO INTERFERING SUBSTANCE 30.2 30.2 30.3 30.3  MCHC RESULTS UNAVAILABLE DUE TO INTERFERING SUBSTANCE 37.1* 36.4* 37.2* 36.7*  RDW RESULTS UNAVAILABLE DUE TO INTERFERING SUBSTANCE 13.1 13.1 13.2 13.3    Recent Labs  Lab 04/28/24 1538  04/28/24 2156 04/29/24 0234 04/30/24 0111 05/02/24 0439  NA 129*  --  131* 135 135  K 3.6  --  3.8 3.0* 3.6  CL 87*  --  91* 92* 90*  CO2 10*  --  7* 14* 19*  ANIONGAP 32*  --  33* 29* 26*  GLUCOSE 161*  --  97 90 143*  BUN 217*  --  217* 110* 80*  CREATININE 10.60* 10.40* 10.40* 6.13* 6.02*  AST 20  --   --   --   --   ALT 20  --   --   --   --   ALKPHOS 77  --   --   --   --   BILITOT 0.6  --   --   --   --   ALBUMIN 4.4  --  4.0 3.9 4.3  INR 1.1  --   --  1.0  --   TSH  --  1.990 1.780  --   --   PHOS  --   --  14.5* 9.3* 8.3*  CALCIUM  6.5*  --  6.4* 7.3* 8.8*      Recent Labs  Lab 04/28/24 1538 04/28/24 2156 04/29/24 0234 04/30/24 0111 05/02/24 0439  INR 1.1  --   --  1.0  --   TSH  --  1.990 1.780  --   --   CALCIUM  6.5*  --  6.4* 7.3* 8.8*    --------------------------------------------------------------------------------------------------------------- Lab Results  Component Value Date   CHOL 151 05/25/2021   HDL 47 05/25/2021   LDLCALC 81 05/25/2021   TRIG 133 05/25/2021   CHOLHDL 3.2 05/25/2021    Lab Results  Component Value Date   HGBA1C 5.8 (A) 04/23/2024   No results for input(s): TSH, T4TOTAL, FREET4, T3FREE, THYROIDAB in the last 72 hours.  Recent Labs    05/02/24 0439  FERRITIN 1,225*  TIBC 171*  IRON 88   ------------------------------------------------------------------------------------------------------------------ Cardiac Enzymes No results for input(s): CKMB, TROPONINI, MYOGLOBIN in the last 168 hours.  Invalid input(s): CK  Micro Results Recent Results (from the past 240 hours)  Resp panel by RT-PCR (RSV, Flu A&B, Covid) Anterior Nasal Swab     Status: None   Collection Time: 04/28/24  5:14 PM   Specimen: Anterior Nasal Swab  Result Value Ref Range Status   SARS Coronavirus 2 by RT PCR NEGATIVE NEGATIVE Final   Influenza A by PCR NEGATIVE NEGATIVE Final   Influenza B by PCR NEGATIVE NEGATIVE Final     Comment: (NOTE) The Xpert Xpress SARS-CoV-2/FLU/RSV plus assay is intended as  an aid in the diagnosis of influenza from Nasopharyngeal swab specimens and should not be used as a sole basis for treatment. Nasal washings and aspirates are unacceptable for Xpert Xpress SARS-CoV-2/FLU/RSV testing.  Fact Sheet for Patients: bloggercourse.com  Fact Sheet for Healthcare Providers: seriousbroker.it  This test is not yet approved or cleared by the [place]  FDA and has been authorized for detection and/or diagnosis of SARS-CoV-2 by FDA under an Emergency Use Authorization (EUA). This EUA will remain in effect (meaning this test can be used) for the duration of the COVID-19 declaration under Section 564(b)(1) of the Act, 21 U.S.C. section 360bbb-3(b)(1), unless the authorization is terminated or revoked.     Resp Syncytial Virus by PCR NEGATIVE NEGATIVE Final    Comment: (NOTE) Fact Sheet for Patients: bloggercourse.com  Fact Sheet for Healthcare Providers: seriousbroker.it  This test is not yet approved or cleared by the [place]  FDA and has been authorized for detection and/or diagnosis of SARS-CoV-2 by FDA under an Emergency Use Authorization (EUA). This EUA will remain in effect (meaning this test can be used) for the duration of the COVID-19 declaration under Section 564(b)(1) of the Act, 21 U.S.C. section 360bbb-3(b)(1), unless the authorization is terminated or revoked.  Performed at [facility], [address], [place], [place] [address]     Radiology Reports  DG Abd Portable 1V Result Date: 04/30/2024 CLINICAL DATA:  [id number] Constipated [id number] EXAM: PORTABLE ABDOMEN - 1 VIEW COMPARISON:  April 28, 2024 FINDINGS: Nonobstructive bowel gas pattern.Small volume fecal loading in the transverse colonno pneumoperitoneum. No organomegaly or radiopaque calculi. No  acute fracture or destructive lesion. Aortoiliac atherosclerosis. IMPRESSION: Nonobstructive bowel gas pattern. Small volume fecal loading in the transverse colon Electronically Signed   By: Rogelia Myers M.D.   On: 04/30/2024 10:11      Signature  -   Lavada Stank M.D on 05/02/2024 at 8:22 AM   -  To page go to [email or web address]     "

## 2024-05-02 NOTE — Procedures (Signed)
 " [facility] Progress Note    Assessment/ Plan:   # CKD stage V, now ESRD - s/p TDC placement 12/17 with IR. Slow start protocol to prevent dialysis dysequilibrium syndrome. HD#1 12/17. Hold HD 12/18 given rapid drop in BUN. HD#2 12/19. #3 12/20 in chair - CLIP: [facility] MWF 12pm chair time -next HD tentatively planned for Tues (holiday sched) however will reassess his symptoms tomorrow -holding off on VVS consult for perm access creation. Had a lengthy convo with wife in regards to home therapies/PD. They are VERY interested in this and would like to move forward with this after discharge, being arranged as OP   # Metabolic acidosis - improving with HD   # Hypotension  - resolved & stable   # Weakness, N/V - May be secondary to uremia as well as hypocalcemia and metabolic acidosis - hopeful for improvement with HD -PT/OT   # Hypocalcemia  - Secondary to advanced CKD  -cal improving -calcitriol  and cal acetate   # Hyponatremia  - Secondary to impaired free water excretion  - resolved with HD   # Thrombocytopenia -work up per primary service  # ANCA vasculitis -doubt his renal failure is secondary to ANCA flare. A biopsy will not add much utility in his particular case given how diffusely atrophied his kidneys are on imaging. Also not much hematuria on urine sediment from 12/16 -c/w imuran  and pred   #Secondary hyperparathyroidism -PTH 212, with hyperphos -started phoslo  tidac, calcitriol  0.70mcg on HD days -po4 improving  Targeting d/c on Mon or Tues if doing okay, discussed with primary.  FYI -- Holiday Schedule for next week Usual MWF schedule -> Sun (21), Tues (23), Friday (26)   Subjective:   Patient seen and examined on dialysis. Dialyzing in chair, has some pain in his hips in chair but tolerable. Nausea slightly better.   Objective:   BP 108/77   Pulse 84   Temp 98.2 F (36.8 C)   Resp 15   Ht 6' 1 (1.854 m)   Wt 65 kg   SpO2 97%    BMI 18.91 kg/m   Intake/Output Summary (Last 24 hours) at 05/02/2024 1053 Last data filed at 05/01/2024 1700 Gross per 24 hour  Intake 45 ml  Output 1800 ml  Net -1755 ml   Weight change:   Physical Exam: Gen: chronically ill appearing, NAD, laying flat CVS: RRR Resp: CTA BL Abd: soft, nt/nd Ext: no edema Neuro: awake, alert, intermittent myoclonic jerks Dialysis access: RIJ TDC  Imaging: No results found.   Labs: BMET Recent Labs  Lab 04/28/24 1538 04/28/24 2156 04/29/24 0234 04/30/24 0111 05/02/24 0439  NA 129*  --  131* 135 135  K 3.6  --  3.8 3.0* 3.6  CL 87*  --  91* 92* 90*  CO2 10*  --  7* 14* 19*  GLUCOSE 161*  --  97 90 143*  BUN 217*  --  217* 110* 80*  CREATININE 10.60* 10.40* 10.40* 6.13* 6.02*  CALCIUM  6.5*  --  6.4* 7.3* 8.8*  PHOS  --   --  14.5* 9.3* 8.3*   CBC Recent Labs  Lab 04/28/24 2156 04/29/24 0234 04/30/24 0111 05/02/24 0439  WBC 5.6 7.1 6.4 7.0  HGB 10.4* 9.5* 9.6* 11.4*  HCT 28.0* 26.1* 25.8* 31.1*  MCV 81.4 82.9 81.4 82.7  PLT 75* 73* 70* 80*    Medications:     aspirin  EC  81 mg Oral Daily   atorvastatin   80 mg Oral q1800   azaTHIOprine   50 mg Oral Daily   bisacodyl   10 mg Rectal Once   calcium  acetate  667 mg Oral TID WC   Chlorhexidine  Gluconate Cloth  6 each Topical Q0600   docusate sodium   200 mg Oral BID   feeding supplement (NEPRO CARB STEADY)  237 mL Oral BID BM   heparin  injection (subcutaneous)  5,000 Units Subcutaneous Q8H   heparin  sodium (porcine)  3,200 Units Intracatheter Once   metoprolol  tartrate  25 mg Oral BID   polyethylene glycol  17 g Oral Daily   predniSONE   5 mg Oral Q breakfast     [name], MD [facility] 05/02/2024, 10:53 AM   "

## 2024-05-02 NOTE — Plan of Care (Signed)

## 2024-05-02 NOTE — Procedures (Signed)
 I was present at this dialysis session. I have reviewed the session itself and made appropriate changes.   Filed Weights   05/01/24 1030 05/01/24 1329 05/01/24 1332  Weight: 66 kg 65 kg 65 kg    Recent Labs  Lab 05/02/24 0439  NA 135  K 3.6  CL 90*  CO2 19*  GLUCOSE 143*  BUN 80*  CREATININE 6.02*  CALCIUM  8.8*  PHOS 8.3*    Recent Labs  Lab 04/29/24 0234 04/30/24 0111 05/02/24 0439  WBC 7.1 6.4 7.0  HGB 9.5* 9.6* 11.4*  HCT 26.1* 25.8* 31.1*  MCV 82.9 81.4 82.7  PLT 73* 70* 80*    Scheduled Meds:  aspirin  EC  81 mg Oral Daily   atorvastatin   80 mg Oral q1800   azaTHIOprine   50 mg Oral Daily   bisacodyl   10 mg Rectal Once   calcium  acetate  667 mg Oral TID WC   Chlorhexidine  Gluconate Cloth  6 each Topical Q0600   docusate sodium   200 mg Oral BID   feeding supplement (NEPRO CARB STEADY)  237 mL Oral BID BM   heparin  injection (subcutaneous)  5,000 Units Subcutaneous Q8H   heparin  sodium (porcine)  3,200 Units Intracatheter Once   metoprolol  tartrate  25 mg Oral BID   polyethylene glycol  17 g Oral Daily   predniSONE   5 mg Oral Q breakfast   Continuous Infusions:  promethazine  (PHENERGAN ) injection (IM or IVPB) 25 mg (04/30/24 1013)   PRN Meds:.acetaminophen  **OR** acetaminophen , calcitRIOL , metoprolol  tartrate, ondansetron  **OR** ondansetron  (ZOFRAN ) IV, oxyCODONE -acetaminophen  **AND** oxyCODONE , promethazine  (PHENERGAN ) injection (IM or IVPB), tiZANidine    [name], MD [facility] 05/02/2024, 10:52 AM

## 2024-05-02 NOTE — Plan of Care (Signed)

## 2024-05-02 NOTE — Progress Notes (Signed)
 Received patient in bed to unit.  Alert and oriented.  Informed consent signed and in chart.   TX duration:3.5 hours in chair.  Patient had low SBP preventing him from hitting prescribed goal of 1L.  Nephrologist informed.  Patient tolerated well.  Transported back to the room  Alert, without acute distress.  Hand-off given to patient's nurse.   Access used: R Chest HD cath Access issues: BFR to 200 due to high arterial pressures  Total UF removed:   05/02/24 1152  Vitals  Temp (!) 97.3 F (36.3 C)  Temp Source Oral  BP 130/80  Pulse Rate 88  Resp 17  Type of Weight Post-Dialysis  Oxygen Therapy  SpO2 98 %  O2 Device Room Air  Patient Activity (if Appropriate) In chair  Post Treatment  Dialyzer Clearance Lightly streaked  Liters Processed 46.8  Fluid Removed (mL) 500 mL  Tolerated HD Treatment No (Comment)  Post-Hemodialysis Comments BP could not support goal of UF 1L  Hemodialysis Catheter Right Internal jugular Double lumen Permanent (Tunneled)  Placement Date/Time: 04/29/24 0956   Placed prior to admission: No  Serial / Lot #: [id number]  Expiration Date: 10/11/28  Time Out: Correct patient;Correct site;Correct procedure  Maximum sterile barrier precautions: Hand hygiene;Large sterile sheet;C...  Site Condition No complications  Blue Lumen Status Flushed;Antimicrobial dead end cap;Dead end cap in place  Red Lumen Status Flushed;Antimicrobial dead end cap;Heparin  locked  Purple Lumen Status N/A  Catheter fill solution Heparin  1000 units/ml  Catheter fill volume (Arterial) 1.6 cc  Catheter fill volume (Venous) 1.6  Dressing Type Transparent  Dressing Status Clean, Dry, Intact  Drainage Description None  Dressing Change Due 05/06/24  Post treatment catheter status Capped and Clamped     Camellia Brasil LPN [facility]

## 2024-05-02 NOTE — Plan of Care (Signed)
   Problem: Health Behavior/Discharge Planning: Goal: Ability to manage health-related needs will improve Outcome: Progressing   Problem: Clinical Measurements: Goal: Diagnostic test results will improve Outcome: Progressing   Problem: Activity: Goal: Risk for activity intolerance will decrease Outcome: Progressing   Problem: Coping: Goal: Level of anxiety will decrease Outcome: Progressing   Problem: Safety: Goal: Ability to remain free from injury will improve Outcome: Progressing

## 2024-05-03 ENCOUNTER — Other Ambulatory Visit (HOSPITAL_COMMUNITY): Payer: Self-pay

## 2024-05-03 DIAGNOSIS — N186 End stage renal disease: Secondary | ICD-10-CM | POA: Diagnosis not present

## 2024-05-03 NOTE — Progress Notes (Signed)
 " [facility] Progress Note    Assessment/ Plan:   # CKD stage V, now ESRD - s/p TDC placement 12/17 with IR. Slow start protocol to prevent dialysis dysequilibrium syndrome. HD#1 12/17. Hold HD 12/18 given rapid drop in BUN. HD#2 12/19. #3 12/20 in chair - CLIP: [facility] MWF 12pm chair time -plan for HD tomorrow, would be okay with discharge post HD from our end and can resume HD OP this coming Tues (holiday sched) -holding off on VVS consult for perm access creation. Had a lengthy convo with wife in regards to home therapies/PD. 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 A biopsy will not add much utility in his particular case given how diffusely atrophied his kidneys are on imaging. Also not much hematuria on urine sediment from 12/16 -c/w imuran  and pred   #Secondary hyperparathyroidism -PTH 212, with hyperphos -started phoslo  tidac, calcitriol  0.25mcg on HD days -po4 improving  Anemia of CKD -Hgb on target for ESRD, 11.4. Transfuse for hgb <7  Targeting d/c on Mon or Tues if doing okay, discussed with primary.  FYI -- Holiday Schedule for next week Usual MWF schedule -> Sun (21), Tues (23), Friday (26)   Subjective:   Patient seen and examined bedside. He reports that he now feels significant better--with much less nausea.   Objective:   BP (!) 131/99 (BP Location: Right Arm)    Pulse 71   Temp 98.3 F (36.8 C) (Oral)   Resp 20   Ht 6' 1 (1.854 m)   Wt 65 kg   SpO2 94%   BMI 18.91 kg/m   Intake/Output Summary (Last 24 hours) at 05/03/2024 1103 Last data filed at 05/02/2024 1152 Gross per 24 hour  Intake --  Output 500 ml  Net -500 ml   Weight change:   Physical Exam: Gen: chronically ill appearing, NAD, laying flat CVS: RRR Resp: normal wob/unlabored Abd: soft, nt/nd Ext: no edema Neuro: awake, alert, no tremors Dialysis access: RIJ TDC  Imaging: No results found.   Labs: BMET Recent Labs  Lab 04/28/24 1538 04/28/24 2156 04/29/24 0234 04/30/24 0111 05/02/24 0439  NA 129*  --  131* 135 135  K 3.6  --  3.8 3.0* 3.6  CL 87*  --  91* 92* 90*  CO2 10*  --  7* 14* 19*  GLUCOSE 161*  --  97 90 143*  BUN 217*  --  217* 110* 80*  CREATININE 10.60* 10.40* 10.40* 6.13* 6.02*  CALCIUM  6.5*  --  6.4* 7.3* 8.8*  PHOS  --   --  14.5* 9.3* 8.3*   CBC Recent Labs  Lab 04/28/24 2156 04/29/24 0234 04/30/24 0111 05/02/24 0439  WBC 5.6 7.1 6.4 7.0  HGB 10.4* 9.5* 9.6* 11.4*  HCT 28.0* 26.1* 25.8* 31.1*  MCV 81.4 82.9 81.4 82.7  PLT 75* 73* 70*  80*    Medications:     aspirin  EC  81 mg Oral Daily   atorvastatin   80 mg Oral q1800   azaTHIOprine   50 mg Oral Daily   bisacodyl   10 mg Rectal Once   calcium  acetate  667 mg Oral TID WC   Chlorhexidine  Gluconate Cloth  6 each Topical Q0600   docusate sodium   200 mg Oral BID   feeding supplement (NEPRO CARB STEADY)  237 mL Oral BID BM   heparin  injection (subcutaneous)  5,000 Units Subcutaneous Q8H   metoprolol  tartrate  25 mg Oral BID   polyethylene glycol  17 g Oral Daily   predniSONE   5 mg Oral Q breakfast     [name], MD [facility] 05/03/2024, 11:03 AM   "

## 2024-05-03 NOTE — Progress Notes (Signed)
 " PROGRESS NOTE    Jeremy Grant  FMW:[id number] DOB: 04-24-1961 DOA: 04/28/2024 PCP: Burney Darice CROME, MD    Brief Narrative:  63 y.o. male with medical history significant of chronic kidney disease stage V, hypertension, nonischemic cardiomyopathy systolic and diastolic CHF who presented to ED with significantly profound weakness which is ongoing for about 2 to 3 days.  He denied any other complaint such as nausea, vomiting, fever, chills, sweating, chest pain or shortness of breath.  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  He was last seen by outpatien nephrology on 02/26/2024 with a creatinine of 5.81, BUN 112, GFR 10, and K 4.3. He has been on prednisone  5 mg daily and azathioprine  50 mg daily.   In the ED, he was afebrile, HR 70, RR 15, BP 98/59, SpO2 100% on RA.  CBC results initially unclear due to interfering substance reported by lab, later repeated to show hemoglobin 10.4, platelets 75.  CMP showed sodium 129, potassium 3.6, chloride 87, BUN 217, creatinine 10.6, calcium  6.5, anion gap 32.  PT/INR within normal limits.  UA showed small Hgb and 30 protein.  TSH and FT4 within normal limits.  4 Plex PCR negative.  CXR unremarkable.  Nephrology was consulted recommended CT abdomen pelvis which showed diverticulosis and diffuse renal atrophy bilaterally and a single punctate calculus in the right kidney, large stool burden.  Nephrology recommended admission for initiation of hemodialysis.  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 with signs of uremia >> nausea, metabolic acidosis - Nephrology consulted underwent right IJ HD catheter placement, HD started, still needs a few runs as he is still nauseated from uremia, continue HD for acidosis and fluid removal, due for another HD on 05/02/2024 note  patient is having persistent nausea due to uremia.  Hopefully with continued HD symptoms will improve   #Hyperphosphatemia - Secondary to ESRD - Phosphorus 14.5 - Management with HD  #Metabolic acidosis - Likely secondary to ESRD - Receiving LR at 75 mL/h for 12 hours - Further management with HD  # Hypocalcemia - Secondary to ESRD - Received 1 g calcium   # Hyponatremia - Secondary to ESRD and impaired free water excretion - Improved to 131  #Anemia of chronic disease - Baseline hemoglobin 9-10 - Hgb stable at baseline  #Chronic thrombocytopenia - Platelets 73 - No active bleeding - Continue to monitor  # ANCA-positive necrotizing crescentic GN - Continue home Imuran  and prednisone  - Nephrology following  # Hypertension - Low-dose beta-blocker and monitor  # Chronic left leg wound - Chronic, follows with outpatient wound care and Dr. Harden. Wife performs daily wound care at home.  - WOC consulted, Dr Harden following.  # HX of stroke, chronic left-sided weakness.  Aspirin  and statin for secondary prevention.  PT OT.    Persistent nausea.  Due to uremia.  Exam stable, KUB nonacute, on bowel regimen, HD for uremia as above.    Constipation.  resolved after bowel regimen  Generalized weakness - Likely secondary to uremia, hypocalcemia, and metabolic acidosis. Management as above - PT/OT consulted for evaluation   Persistent refusal to take Heparin  for DVT prophylaxis, warned and counseled.   DVT prophylaxis: Place and maintain sequential compression device Start: 05/01/24 1711 heparin  injection 5,000 Units Start: 04/30/24 1400 Place and  maintain sequential compression device Start: 04/30/24 0049 Heparin  added on 04/30/2024.  Code Status:   Code Status: Full Code  Family Communication: Discussed with wife at bedside on 04/30/2024  Disposition Plan: Pending clinical improvement after HD initiation and securing outpatient HD chair PT - Follow Up Recommendations:  Home health PT OT - Follow Up Recommendations: Home health OT  DME Needs: PT equipment: Rolling walker (2 wheels), BSC/3in1     Level of care: Telemetry  Consultants:  Nephrology  Procedures:  Tunneled hemodialysis catheter placement 12/17  Antimicrobials: None  Subjective: Patient in bed denies any headache chest or abdominal pain, nausea is much improved, appetite is returning.  Feels better.   Objective: Vitals:   05/02/24 2000 05/03/24 0400 05/03/24 0600 05/03/24 0802  BP: (!) 158/86 110/75  (!) 131/99  Pulse: 60 75 88 71  Resp:    20  Temp:    98.3 F (36.8 C)  TempSrc:    Oral  SpO2: 97% 96% 96% 94%  Weight:      Height:        Intake/Output Summary (Last 24 hours) at 05/03/2024 1029 Last data filed at 05/02/2024 1152 Gross per 24 hour  Intake --  Output 500 ml  Net -500 ml   Filed Weights   05/01/24 1030 05/01/24 1329 05/01/24 1332  Weight: 66 kg 65 kg 65 kg    Examination:  Awake Alert, chronic left-sided weakness from previous stroke, right IJ HD catheter [place].AT,PERRAL Supple Neck, No JVD,   Symmetrical Chest wall movement, Good air movement bilaterally, CTAB RRR,No Gallops, Rubs or new Murmurs,  +ve B.Sounds, Abd Soft, No tenderness,   No Cyanosis, Clubbing or edema     Data Reviewed: I have personally reviewed following labs and imaging studies   Data Review:   Patient Lines/Drains/Airways Status     Active Line/Drains/Airways     Name Placement date Placement time Site Days   Peripheral IV 04/30/24 22 G 1.75 Anterior;Left Forearm 04/30/24  1007  Forearm  3   Hemodialysis Catheter Right Internal jugular Double lumen Permanent (Tunneled) 04/29/24  0956  Internal jugular  4   Urethral Catheter RN who placed Foley did not chart it Double-lumen 04/28/24  --  Double-lumen  5   Wound 04/29/24 1401 Vascular Ulcer Tibial Left;Posterior;Proximal 04/29/24  1401  Tibial  4   Wound 04/29/24 2255 Other (Comment) Toe (Comment  which one)  Anterior;Left 04/29/24  2255  Toe (Comment  which one)  4   Wound 04/29/24 2255 Other (Comment) Wrist Lower;Right;Lateral 04/29/24  2255  Wrist  4             Inpatient Medications  Scheduled Meds:  aspirin  EC  81 mg Oral Daily   atorvastatin   80 mg Oral q1800   azaTHIOprine   50 mg Oral Daily   bisacodyl   10 mg Rectal Once   calcium  acetate  667 mg Oral TID WC   Chlorhexidine  Gluconate Cloth  6 each Topical Q0600   docusate sodium   200 mg Oral BID   feeding supplement (NEPRO CARB STEADY)  237 mL Oral BID BM   heparin  injection (subcutaneous)  5,000 Units Subcutaneous Q8H   metoprolol  tartrate  25 mg Oral BID   polyethylene glycol  17 g Oral Daily   predniSONE   5 mg Oral Q breakfast   Continuous Infusions:  promethazine  (PHENERGAN ) injection (IM or IVPB) 25 mg (04/30/24 1013)   PRN Meds:.acetaminophen  **OR** acetaminophen , calcitRIOL , metoprolol  tartrate, ondansetron  **OR** ondansetron  (ZOFRAN ) IV, oxyCODONE -acetaminophen  **  AND** oxyCODONE , promethazine  (PHENERGAN ) injection (IM or IVPB), tiZANidine   DVT Prophylaxis  Place and maintain sequential compression device Start: 05/01/24 1711 heparin  injection 5,000 Units Start: 04/30/24 1400 Place and maintain sequential compression device Start: 04/30/24 0049   Recent Labs  Lab 04/28/24 1538 04/28/24 2156 04/29/24 0234 04/30/24 0111 05/02/24 0439  WBC 5.6 5.6 7.1 6.4 7.0  HGB 14.0 10.4* 9.5* 9.6* 11.4*  HCT RESULTS UNAVAILABLE DUE TO INTERFERING SUBSTANCE 28.0* 26.1* 25.8* 31.1*  PLT 70* 75* 73* 70* 80*  MCV RESULTS UNAVAILABLE DUE TO INTERFERING SUBSTANCE 81.4 82.9 81.4 82.7  MCH RESULTS UNAVAILABLE DUE TO INTERFERING SUBSTANCE 30.2 30.2 30.3 30.3  MCHC RESULTS UNAVAILABLE DUE TO INTERFERING SUBSTANCE 37.1* 36.4* 37.2* 36.7*  RDW RESULTS UNAVAILABLE DUE TO INTERFERING SUBSTANCE 13.1 13.1 13.2 13.3    Recent Labs  Lab 04/28/24 1538 04/28/24 2156 04/29/24 0234 04/30/24 0111 05/02/24 0439  NA 129*  --  131* 135  135  K 3.6  --  3.8 3.0* 3.6  CL 87*  --  91* 92* 90*  CO2 10*  --  7* 14* 19*  ANIONGAP 32*  --  33* 29* 26*  GLUCOSE 161*  --  97 90 143*  BUN 217*  --  217* 110* 80*  CREATININE 10.60* 10.40* 10.40* 6.13* 6.02*  AST 20  --   --   --   --   ALT 20  --   --   --   --   ALKPHOS 77  --   --   --   --   BILITOT 0.6  --   --   --   --   ALBUMIN 4.4  --  4.0 3.9 4.3  INR 1.1  --   --  1.0  --   TSH  --  1.990 1.780  --   --   PHOS  --   --  14.5* 9.3* 8.3*  CALCIUM  6.5*  --  6.4* 7.3* 8.8*      Recent Labs  Lab 04/28/24 1538 04/28/24 2156 04/29/24 0234 04/30/24 0111 05/02/24 0439  INR 1.1  --   --  1.0  --   TSH  --  1.990 1.780  --   --   CALCIUM  6.5*  --  6.4* 7.3* 8.8*    --------------------------------------------------------------------------------------------------------------- Lab Results  Component Value Date   CHOL 151 05/25/2021   HDL 47 05/25/2021   LDLCALC 81 05/25/2021   TRIG 133 05/25/2021   CHOLHDL 3.2 05/25/2021    Lab Results  Component Value Date   HGBA1C 5.8 (A) 04/23/2024   No results for input(s): TSH, T4TOTAL, FREET4, T3FREE, THYROIDAB in the last 72 hours.  Recent Labs    05/02/24 0439  FERRITIN 1,225*  TIBC 171*  IRON 88   ------------------------------------------------------------------------------------------------------------------ Cardiac Enzymes No results for input(s): CKMB, TROPONINI, MYOGLOBIN in the last 168 hours.  Invalid input(s): CK  Micro Results Recent Results (from the past 240 hours)  Resp panel by RT-PCR (RSV, Flu A&B, Covid) Anterior Nasal Swab     Status: None   Collection Time: 04/28/24  5:14 PM   Specimen: Anterior Nasal Swab  Result Value Ref Range Status   SARS Coronavirus 2 by RT PCR NEGATIVE NEGATIVE Final   Influenza A by PCR NEGATIVE NEGATIVE Final   Influenza B by PCR NEGATIVE NEGATIVE Final    Comment: (NOTE) The Xpert Xpress SARS-CoV-2/FLU/RSV plus assay is intended as an  aid in the diagnosis of influenza from Nasopharyngeal swab specimens and  should not be used as a sole basis for treatment. Nasal washings and aspirates are unacceptable for Xpert Xpress SARS-CoV-2/FLU/RSV testing.  Fact Sheet for Patients: bloggercourse.com  Fact Sheet for Healthcare Providers: seriousbroker.it  This test is not yet approved or cleared by the [place]  FDA and has been authorized for detection and/or diagnosis of SARS-CoV-2 by FDA under an Emergency Use Authorization (EUA). This EUA will remain in effect (meaning this test can be used) for the duration of the COVID-19 declaration under Section 564(b)(1) of the Act, 21 U.S.C. section 360bbb-3(b)(1), unless the authorization is terminated or revoked.     Resp Syncytial Virus by PCR NEGATIVE NEGATIVE Final    Comment: (NOTE) Fact Sheet for Patients: bloggercourse.com  Fact Sheet for Healthcare Providers: seriousbroker.it  This test is not yet approved or cleared by the [place]  FDA and has been authorized for detection and/or diagnosis of SARS-CoV-2 by FDA under an Emergency Use Authorization (EUA). This EUA will remain in effect (meaning this test can be used) for the duration of the COVID-19 declaration under Section 564(b)(1) of the Act, 21 U.S.C. section 360bbb-3(b)(1), unless the authorization is terminated or revoked.  Performed at [facility], [address] [place], [place] [address]     Radiology Reports  No results found.     Signature  -   Lavada Stank M.D on 05/03/2024 at 10:29 AM   -  To page go to [email or web address]     "

## 2024-05-03 NOTE — Plan of Care (Signed)

## 2024-05-04 ENCOUNTER — Other Ambulatory Visit (HOSPITAL_COMMUNITY): Payer: Self-pay

## 2024-05-04 DIAGNOSIS — N186 End stage renal disease: Secondary | ICD-10-CM | POA: Diagnosis not present

## 2024-05-04 DIAGNOSIS — E43 Unspecified severe protein-calorie malnutrition: Secondary | ICD-10-CM | POA: Insufficient documentation

## 2024-05-04 LAB — RENAL FUNCTION PANEL
Albumin: 4 g/dL (ref 3.5–5.0)
Anion gap: 19 — ABNORMAL HIGH (ref 5–15)
BUN: 93 mg/dL — ABNORMAL HIGH (ref 8–23)
CO2: 23 mmol/L (ref 22–32)
Calcium: 8 mg/dL — ABNORMAL LOW (ref 8.9–10.3)
Chloride: 89 mmol/L — ABNORMAL LOW (ref 98–111)
Creatinine, Ser: 7.44 mg/dL — ABNORMAL HIGH (ref 0.61–1.24)
GFR, Estimated: 8 mL/min — ABNORMAL LOW
Glucose, Bld: 138 mg/dL — ABNORMAL HIGH (ref 70–99)
Phosphorus: 5.8 mg/dL — ABNORMAL HIGH (ref 2.5–4.6)
Potassium: 3.7 mmol/L (ref 3.5–5.1)
Sodium: 132 mmol/L — ABNORMAL LOW (ref 135–145)

## 2024-05-04 LAB — CBC WITH DIFFERENTIAL/PLATELET
Abs Immature Granulocytes: 0.12 K/uL — ABNORMAL HIGH (ref 0.00–0.07)
Basophils Absolute: 0 K/uL (ref 0.0–0.1)
Basophils Relative: 0 %
Eosinophils Absolute: 0.1 K/uL (ref 0.0–0.5)
Eosinophils Relative: 1 %
HCT: 28.6 % — ABNORMAL LOW (ref 39.0–52.0)
Hemoglobin: 10.3 g/dL — ABNORMAL LOW (ref 13.0–17.0)
Immature Granulocytes: 2 %
Lymphocytes Relative: 4 %
Lymphs Abs: 0.3 K/uL — ABNORMAL LOW (ref 0.7–4.0)
MCH: 30.5 pg (ref 26.0–34.0)
MCHC: 36 g/dL (ref 30.0–36.0)
MCV: 84.6 fL (ref 80.0–100.0)
Monocytes Absolute: 0.7 K/uL (ref 0.1–1.0)
Monocytes Relative: 9 %
Neutro Abs: 6.3 K/uL (ref 1.7–7.7)
Neutrophils Relative %: 84 %
Platelets: 101 K/uL — ABNORMAL LOW (ref 150–400)
RBC: 3.38 MIL/uL — ABNORMAL LOW (ref 4.22–5.81)
RDW: 12.8 % (ref 11.5–15.5)
WBC: 7.4 K/uL (ref 4.0–10.5)
nRBC: 0 % (ref 0.0–0.2)

## 2024-05-04 MED ORDER — DOCUSATE SODIUM 100 MG PO CAPS
200.0000 mg | ORAL_CAPSULE | Freq: Every day | ORAL | 0 refills | Status: AC | PRN
  Filled 2024-05-04: qty 10, 5d supply, fill #0

## 2024-05-04 MED ORDER — HEPARIN SODIUM (PORCINE) 1000 UNIT/ML IJ SOLN
INTRAMUSCULAR | Status: AC
  Filled 2024-05-04: qty 4

## 2024-05-04 MED ORDER — ONDANSETRON HCL 4 MG PO TABS: 4.0000 mg | ORAL_TABLET | Freq: Three times a day (TID) | ORAL | 0 refills | Status: AC | PRN

## 2024-05-04 MED ORDER — ACETAMINOPHEN 325 MG PO TABS
650.0000 mg | ORAL_TABLET | Freq: Four times a day (QID) | ORAL | 0 refills | Status: AC | PRN
  Filled 2024-05-04: qty 20, 3d supply, fill #0

## 2024-05-04 MED ORDER — CARVEDILOL 3.125 MG PO TABS
3.1250 mg | ORAL_TABLET | Freq: Two times a day (BID) | ORAL | 0 refills | Status: AC
  Filled 2024-05-04: qty 60, 30d supply, fill #0

## 2024-05-04 NOTE — Discharge Planning (Signed)
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1* 28.6*  MCV 81.4 82.9 81.4 82.7 84.6  PLT 75* 73* 70* 80* 101*   Dialysis Orders: 3d/week 180 dialyzer 4 hours BFR 400 DFR A1.5 EDW 59.5kg 3K/2.5Ca bath TDC no UF or Na profile  Meds: Heparin  - locks ok but no bolus to start (low platelets) Mircera - per protocol Venofer - per protocol Calcitriol  - per protocol (PTH 212 on 04/29/24) ONSP - yes  Other/Appts/Lab orders: - Monthly labs on admit and weekly K + Hgb - He is interested in home therapies -> pls discuss more   Lamarr JONELLE Boehringer, PA-C 05/04/2024, 10:26 AM  [facility] [phone]  "

## 2024-05-04 NOTE — Progress Notes (Signed)
 Reviewed AVS, patient expressed understanding of medications, MD follow up reviewed.   Removed IV, Site clean, dry and intact.  See LDA for information on wounds at discharge.  Patient states all belongings brought to the hospital at time of admission are accounted for and packed to take home.  Picked up medications from [facility] pharmacy.   [facility] contacted to transport patient to entrance A, where family member was waiting in vehicle to transport home.

## 2024-05-04 NOTE — TOC Transition Note (Signed)
 Transition of Care [facility]) - Discharge Note   Patient Details  Name: Jeremy Grant MRN: [id number] Date of Birth: [birth year]  [facility] CM/SW Contact:  Landry DELENA Senters, RN Phone Number: 05/04/2024, 10:16 AM   Clinical Narrative:    Patient will be discharging home today, with family providing transportation.   HH arranged through [facility], info on AVS.   Rolling walker and shower/tub bench rec from therapy. Wife reports they already have both of these at home, so these are not needed.   No further needs identified by CM.  Final next level of care: Home w Home Health Services Barriers to Discharge: No Barriers Identified   Patient Goals and CMS Choice   CMS Medicare.gov Compare Post Acute Care list provided to:: Patient Choice offered to / list presented to : Patient      Discharge Placement                       Discharge Plan and Services Additional resources added to the After Visit Summary for                            [facility] Arranged: PT, RN [facility] Agency: [facility] Date [facility] Agency Contacted: 05/01/24 Time HH Agency Contacted: [phone]    Social Drivers of Health (SDOH) Interventions SDOH Screenings   Food Insecurity: No Food Insecurity (04/29/2024)  Housing: Low Risk (04/29/2024)  Transportation Needs: No Transportation Needs (04/29/2024)  Utilities: Not At Risk (04/29/2024)  Tobacco Use: Low Risk (04/28/2024)     Readmission Risk Interventions     No data to display

## 2024-05-04 NOTE — Progress Notes (Addendum)
 D/C order noted. Contacted [facility] to be advised that pt will d/c today and should start tomorrow. Contacted renal PA to request that orders be sent to clinic. Pt's AVS updated to reflect that pt will start at clinic tomorrow per holiday schedule. Called pt's wife and left a message requesting a return call to remind her that pt will need to go to clinic tomorrow.   Randine Mungo Dialysis Navigator [phone]  Addendum at 12:44 pm: Spoke to pt's wife via phone. Wife aware that pt will need to go to HD clinic tomorrow at 11:00 am.

## 2024-05-04 NOTE — Discharge Instructions (Addendum)
 Mildly keep your left leg wound clean and dry at all times, wound care as instructed by Dr. Harden ( Cleanse wound with Carolynn Soila [phone]). Apply Vashe moist gauze to wound bed, top with dry dressing, wrap with kerlix and ACE wrap. ) .  Kindly follow-up with Dr. Duda within 2 to 3 days of discharge.  Keep your HD catheter site clean and dry at all times.  Follow with Primary MD Burney Darice CROME, MD in 7 days.  Follow-up with your nephrologist within a week of discharge.  Get CBC, CMP, Magnesium, Firas-  checked next visit with your primary MD    Activity: As tolerated with Full fall precautions use walker/cane & assistance as needed  Disposition Home   Diet: Renal diet with 1.2 L fluid restriction per day.  Special Instructions: If you have smoked or chewed Tobacco  in the last 2 yrs please stop smoking, stop any regular Alcohol  and or any Recreational drug use.  On your next visit with your primary care physician please Get Medicines reviewed and adjusted.  Please request your Prim.MD to go over all Hospital Tests and Procedure/Radiological results at the follow up, please get all Hospital records sent to your Prim MD by signing hospital release before you go home.  If you experience worsening of your admission symptoms, develop shortness of breath, life threatening emergency, suicidal or homicidal thoughts you must seek medical attention immediately by calling 911 or calling your MD immediately  if symptoms less severe.  You Must read complete instructions/literature along with all the possible adverse reactions/side effects for all the Medicines you take and that have been prescribed to you. Take any new Medicines after you have completely understood and accpet all the possible adverse reactions/side effects.   Do not drive when taking Pain medications.  Do not take more than prescribed Pain, Sleep and Anxiety Medications  Wear Seat belts while driving.

## 2024-05-04 NOTE — Procedures (Signed)
 I was present at this dialysis session. I have reviewed the session itself and made appropriate changes.   Doing well on HD. Planning to DC today. Knows to go to HD tomorrow outpatient.  Filed Weights   05/04/24 0747  Weight: 59.4 kg    Recent Labs  Lab 05/04/24 0757  NA 132*  K 3.7  CL 89*  CO2 23  GLUCOSE 138*  BUN 93*  CREATININE 7.44*  CALCIUM  8.0*  PHOS 5.8*    Recent Labs  Lab 04/30/24 0111 05/02/24 0439 05/04/24 0757  WBC 6.4 7.0 7.4  NEUTROABS  --   --  6.3  HGB 9.6* 11.4* 10.3*  HCT 25.8* 31.1* 28.6*  MCV 81.4 82.7 84.6  PLT 70* 80* 101*    Scheduled Meds:  aspirin  EC  81 mg Oral Daily   atorvastatin   80 mg Oral q1800   azaTHIOprine   50 mg Oral Daily   bisacodyl   10 mg Rectal Once   calcium  acetate  667 mg Oral TID WC   Chlorhexidine  Gluconate Cloth  6 each Topical Q0600   docusate sodium   200 mg Oral BID   feeding supplement (NEPRO CARB STEADY)  237 mL Oral BID BM   heparin  injection (subcutaneous)  5,000 Units Subcutaneous Q8H   metoprolol  tartrate  25 mg Oral BID   polyethylene glycol  17 g Oral Daily   predniSONE   5 mg Oral Q breakfast   Continuous Infusions:  promethazine  (PHENERGAN ) injection (IM or IVPB) 25 mg (04/30/24 1013)   PRN Meds:.acetaminophen  **OR** acetaminophen , calcitRIOL , metoprolol  tartrate, ondansetron  **OR** ondansetron  (ZOFRAN ) IV, oxyCODONE -acetaminophen  **AND** oxyCODONE , promethazine  (PHENERGAN ) injection (IM or IVPB), tiZANidine    Jayson Player,  MD 05/04/2024, 10:21 AM

## 2024-05-04 NOTE — Plan of Care (Signed)

## 2024-05-04 NOTE — Progress Notes (Signed)
 Received patient in bed to unit.  Alert and oriented.  Informed consent signed and in chart.   TX duration: 3hrs  Patient tolerated well.  Transported back to the room  Alert, without acute distress.  Hand-off given to patient's nurse.   Access used: R. Dialysis catheter; dressing is C, D, and I.  Access issues: N/A  Total UF removed: 1100 Medication(s) given: N/A Post HD VS: Temp 97.4 oral, HR 100, BP 123/73 Post HD weight: 58.3kg  05/04/24 1140  Vitals  Temp (!) 97.4 F (36.3 C)  Temp Source Oral  BP Location Right Arm  BP Method Automatic  Patient Position (if appropriate) Lying  Pulse Rate Source Monitor  Oxygen Therapy  O2 Device Room Air (Simultaneous filing. User may not have seen previous data.)  Patient Activity (if Appropriate) In chair (Simultaneous filing. User may not have seen previous data.)  Hemodialysis Catheter Right Internal jugular Double lumen Permanent (Tunneled)  Placement Date/Time: 04/29/24 0956   Placed prior to admission: No  Serial / Lot #: [id number]  Expiration Date: 10/11/28  Time Out: Correct patient;Correct site;Correct procedure  Maximum sterile barrier precautions: Hand hygiene;Large sterile sheet;C...  Site Condition No complications  Blue Lumen Status Flushed;Heparin  locked  Red Lumen Status Flushed;Heparin  locked  Catheter fill solution Heparin  1000 units/ml  Catheter fill volume (Arterial) 1.6 cc  Catheter fill volume (Venous) 1.6  Dressing Type Transparent  Dressing Status Antimicrobial disc/dressing in place  Drainage Description None  Dressing Change Due 05/06/24  Post treatment catheter status Capped and Clamped     Carlyon Rhein, RN Kidney Dialysis Unit

## 2024-05-04 NOTE — Discharge Summary (Signed)
 "                                                                                                                                                                               Discharge summary note.  Jeremy Grant FMW:[id number] DOB: May 21, 1960 DOA: 04/28/2024  PCP: Burney Darice CROME, MD  Admit date: 04/28/2024  Discharge date: 05/04/2024  Admitted From: Home   Disposition:  Home   Recommendations for Outpatient Follow-up:   Follow up with PCP in 1-2 weeks  PCP Please obtain BMP/CBC, 2 view CXR in 1week,  (see Discharge instructions)   PCP Please follow up on the following pending results:     Home Health: PT, RN if qualifies Equipment/Devices: None Consultations: Nephrology, orthopedics Dr. Harden Discharge Condition: Stable    CODE STATUS: Full    Diet Recommendation: Renal diet with 1.2 L fluid restriction per day    Chief Complaint  Patient presents with   Weakness     Brief history of present illness from the day of admission and additional interim summary    63 y.o. male with medical history significant of chronic kidney disease stage V, hypertension, nonischemic cardiomyopathy systolic and diastolic CHF who presented to ED with significantly profound weakness which is ongoing for about 2 to 3 days.  He denied any other complaint such as nausea, vomiting, fever, chills, sweating, chest pain or shortness of breath.  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  He was last seen by outpatien nephrology on 02/26/2024 with a creatinine of 5.81, BUN 112, GFR 10, and K 4.3. He has been on prednisone  5 mg daily and azathioprine  50 mg daily.    In the ED, he was afebrile, HR 70, RR 15, BP 98/59, SpO2 100% on RA.  CBC results initially unclear due to interfering substance reported by lab, later repeated to show  hemoglobin 10.4, platelets 75.  CMP showed sodium 129, potassium 3.6, chloride 87, BUN 217, creatinine 10.6, calcium  6.5, anion gap 32.  PT/INR within normal limits.  UA showed small Hgb and 30 protein.  TSH and FT4 within normal limits.  4 Plex PCR negative.  CXR unremarkable.  Nephrology was consulted recommended CT abdomen pelvis which showed diverticulosis and diffuse renal atrophy bilaterally and a single punctate calculus in the right kidney, large stool burden.  Nephrology recommended admission for initiation of hemodialysis.  Hospital Course   #CKD stage V with progression to ESRD with signs of uremia >> nausea, metabolic acidosis - Nephrology consulted underwent right IJ HD catheter placement, HD started, still needs a few runs as he is still nauseated from uremia, continue HD for acidosis and fluid removal, overall feels much better after initiation of HD, getting another treatment of HD on 05/04/2024 wants to go home after that.  Says he feels as good as he can.  Wants to go home and follow-up with his PCP, nephrologist and Dr. Harden in the outpatient setting.   #Hyperphosphatemia - Secondary to ESRD - Phosphorus 14.5 - Management with HD   #Metabolic acidosis - Likely secondary to ESRD improving with HD   # Hypocalcemia - Secondary to ESRD - Received 1 g calcium    # Hyponatremia - Secondary to ESRD and impaired free water excretion - Improved to 131   #Anemia of chronic disease - Baseline hemoglobin 9-10 - Hgb stable at baseline   #Chronic thrombocytopenia - Platelets 73 - No active bleeding - Continue to monitor   # ANCA-positive necrotizing crescentic GN - Continue home Imuran  and prednisone  - Nephrology following   # Hypertension - Low-dose beta-blocker and monitor   # Chronic left leg wound - Chronic, follows with outpatient wound care and Dr. Harden. Wife performs daily wound care at home.  - WOC  consulted, Dr Harden following.  Wound care instructions per Dr. Harden, wife provides wound care at home which will be continued home RN also ordered if he qualifies for it.   # HX of stroke, chronic left-sided weakness.  Aspirin  and statin for secondary prevention.  PT OT.     Persistent nausea.  Due to uremia.  Much improved after initiation of HD, feeling close to baseline now with good appetite.  Nausea resolved.   Constipation.  resolved after bowel regimen   Generalized weakness - PT/OT consulted for evaluation, referred for home PT which will be ordered.    Discharge diagnosis     Principal Problem:   ESRD (end stage renal disease) (HCC) Active Problems:   Leg ulcer, left, limited to breakdown of skin (HCC)   Protein-calorie malnutrition, severe    Discharge instructions    Discharge Instructions     Discharge instructions   Complete by: As directed    Mildly keep your left leg wound clean and dry at all times, wound care as instructed by Dr. Harden ( Cleanse wound with Carolynn Soila [phone]). Apply Vashe moist gauze to wound bed, top with dry dressing, wrap with kerlix and ACE wrap. ) .  Kindly follow-up with Dr. Duda within 2 to 3 days of discharge.  Keep your HD catheter site clean and dry at all times.  Follow with Primary MD Burney Darice CROME, MD in 7 days.  Follow-up with your nephrologist within a week of discharge.  Get CBC, CMP, Magnesium, Firas-  checked next visit with your primary MD    Activity: As tolerated with Full fall precautions use walker/cane & assistance as needed  Disposition Home   Diet: Renal diet with 1.2 L fluid restriction per day.  Special Instructions: If you have smoked or chewed Tobacco  in the last 2 yrs please stop smoking, stop any regular Alcohol  and or any Recreational drug use.  On your next visit with your primary care physician please Get Medicines reviewed and adjusted.  Please request your Prim.MD to go over all Hospital Tests  and Procedure/Radiological results at the  follow up, please get all Hospital records sent to your Prim MD by signing hospital release before you go home.  If you experience worsening of your admission symptoms, develop shortness of breath, life threatening emergency, suicidal or homicidal thoughts you must seek medical attention immediately by calling 911 or calling your MD immediately  if symptoms less severe.  You Must read complete instructions/literature along with all the possible adverse reactions/side effects for all the Medicines you take and that have been prescribed to you. Take any new Medicines after you have completely understood and accpet all the possible adverse reactions/side effects.   Do not drive when taking Pain medications.  Do not take more than prescribed Pain, Sleep and Anxiety Medications  Wear Seat belts while driving.   Discharge wound care:   Complete by: As directed    Continue left leg wound care as before follow-up with Dr. Harden in 2-3 days postdischarge.  Keep your HD catheter site clean and dry at all times.   Increase activity slowly   Complete by: As directed        Discharge Medications   Allergies as of 05/04/2024       Reactions   Hydralazine  Hcl    Almost killed him per wife        Medication List     STOP taking these medications    amLODipine  10 MG tablet Commonly known as: NORVASC    doxazosin  8 MG tablet Commonly known as: CARDURA    HYDROmorphone  2 MG tablet Commonly known as: DILAUDID    oxyCODONE -acetaminophen  10-325 MG tablet Commonly known as: PERCOCET       TAKE these medications    acetaminophen  325 MG tablet Commonly known as: TYLENOL  Take 2 tablets (650 mg total) by mouth every 6 (six) hours as needed for moderate pain (pain score 4-6).   aspirin  EC 81 MG tablet Take 1 tablet (81 mg total) by mouth daily.   atorvastatin  80 MG tablet Commonly known as: LIPITOR  Take 1 tablet (80 mg total) by mouth daily at 6  PM.   azaTHIOprine  50 MG tablet Commonly known as: IMURAN  Take 1 tablet (50 mg total) by mouth daily.   carvedilol  3.125 MG tablet Commonly known as: COREG  Take 1 tablet (3.125 mg total) by mouth 2 (two) times daily with a meal. What changed:  medication strength how much to take   docusate sodium  100 MG capsule Commonly known as: COLACE Take 2 capsules (200 mg total) by mouth daily as needed for mild constipation.   ondansetron  4 MG tablet Commonly known as: Zofran  Take 1 tablet (4 mg total) by mouth every 8 (eight) hours as needed for nausea or vomiting.   polyethylene glycol powder 17 GM/SCOOP powder Commonly known as: GLYCOLAX /MIRALAX  Take 1 capful (17 g) by mouth daily.   predniSONE  5 MG tablet Commonly known as: DELTASONE  Take 5 mg by mouth daily with breakfast.   tiZANidine  4 MG tablet Commonly known as: ZANAFLEX  Take 1 tablet (4 mg total) by mouth every 8 (eight) hours as needed for muscle spasms.               Discharge Care Instructions  (From admission, onward)           Start     Ordered   05/04/24 0000  Discharge wound care:       Comments: Continue left leg wound care as before follow-up with Dr. Harden in 2-3 days postdischarge.  Keep your HD catheter site clean and dry  at all times.   05/04/24 9090             Contact information for follow-up providers     [facility]. Go on 05/03/2024.   Why: Schedule the week of Chritmas and New Years- Sunday, Tuesday, Friday.  On Sunday (12/21), please arrive at 11:00 am to complete papework prior to treatment at 12:00 pm. After the holidays, schedule will be Monday, Wednesay, Friday wtih 11:40 am arrival for 12:00 pm chair time. Contact information: [address] [place] [address] [phone]         Transportation through [facility] Medicare Follow up.   Why: To set up transportation through [employer], please call the number listed or you may have a  phone number listed for transportation on the back of your health insurance card. Contact information: [phone]        Harden Jerona GAILS, MD Follow up in 2 week(s).   Specialty: Orthopedic Surgery Contact information: [address] [place]  [place] [address] [phone]         Burney Darice CROME, MD. Schedule an appointment as soon as possible for a visit in 1 week(s).   Specialty: Family Medicine Contact information: [address] [place] [address] [phone]              Contact information for after-discharge care     Home Medical Care     [facility]) .   Service: Home Health Services Contact information: [address] [place]  [phone]                     Major procedures and Radiology Reports - PLEASE review detailed and final reports thoroughly  -       DG Abd Portable 1V Result Date: 04/30/2024 CLINICAL DATA:  [id number] Constipated [id number] EXAM: PORTABLE ABDOMEN - 1 VIEW COMPARISON:  April 28, 2024 FINDINGS: Nonobstructive bowel gas pattern.Small volume fecal loading in the transverse colonno pneumoperitoneum. No organomegaly or radiopaque calculi. No acute fracture or destructive lesion. Aortoiliac atherosclerosis. IMPRESSION: Nonobstructive bowel gas pattern. Small volume fecal loading in the transverse colon Electronically Signed   By: Rogelia Myers M.D.   On: 04/30/2024 10:11   IR TUNNELED CENTRAL VENOUS CATH [facility] IMG Result Date: 04/29/2024 INDICATION: 63 year old with end-stage renal disease and needs hemodialysis. EXAM: FLUOROSCOPIC AND ULTRASOUND GUIDED PLACEMENT OF A TUNNELED DIALYSIS CATHETER Physician: Juliene SAUNDERS. Philip, MD MEDICATIONS: Ancef  2 g; The antibiotic was administered within an appropriate time interval prior to skin puncture. ANESTHESIA/SEDATION: Moderate (conscious) sedation was employed during this procedure. A total of Versed  0.5  mg and fentanyl  25 mcg was administered intravenously at the order of the provider performing the procedure. Total intra-service moderate sedation time: 20 minutes. Patient's level of consciousness and vital signs were monitored continuously by radiology nurse throughout the procedure under the supervision of the provider performing the procedure. FLUOROSCOPY TIME:  Radiation Exposure Index (as provided by the fluoroscopic device): 1 mGy Kerma COMPLICATIONS: None immediate. PROCEDURE: Informed consent was obtained for placement of a tunneled dialysis catheter. The patient was placed supine on the interventional table. Ultrasound confirmed a patent right internal jugular vein. Ultrasound image obtained for documentation. The right neck and chest was prepped and draped in a sterile fashion. Maximal barrier sterile technique was utilized including caps, mask, sterile gowns, sterile gloves, sterile drape, hand hygiene and skin antiseptic. The right neck was anesthetized  with 1% lidocaine . A small incision was made with #11 blade scalpel. A 21 gauge needle directed into the right internal jugular vein with ultrasound guidance. A micropuncture dilator set was placed. A 19 cm tip to cuff Palindrome catheter was selected. The skin below the right clavicle was anesthetized and a small incision was made with an #11 blade scalpel. A subcutaneous tunnel was formed to the vein dermatotomy site. The catheter was brought through the tunnel. The vein dermatotomy site was dilated to accommodate a peel-away sheath over a wire. The catheter was placed through the peel-away sheath and directed into the central venous structures. The tip of the catheter was placed at superior cavoatrial junction with fluoroscopy. Fluoroscopic images were obtained for documentation. Both lumens were found to aspirate and flush well. The proper amount of heparin  was flushed in both lumens. The vein dermatotomy site was closed using a single layer of  absorbable suture and Dermabond. Gel-Foam placed in subcutaneous tract. The catheter was secured to the skin using Prolene suture. IMPRESSION: Successful placement of a right jugular tunneled dialysis catheter using ultrasound and fluoroscopic guidance. Electronically Signed   By: Juliene Balder M.D.   On: 04/29/2024 10:49   CT ABDOMEN PELVIS WO CONTRAST Result Date: 04/28/2024 EXAM: CT ABDOMEN AND PELVIS WITHOUT CONTRAST 04/28/2024 07:00:55 PM TECHNIQUE: CT of the abdomen and pelvis was performed without the administration of intravenous contrast. Multiplanar reformatted images are provided for review. Automated exposure control, iterative reconstruction, and/or weight-based adjustment of the mA/kV was utilized to reduce the radiation dose to as low as reasonably achievable. COMPARISON: CT abdomen and pelvis 09/15/2020. CLINICAL HISTORY: aki FINDINGS: LOWER CHEST: No acute abnormality. LIVER: The liver is unremarkable. GALLBLADDER AND BILE DUCTS: Gallbladder is unremarkable. No biliary ductal dilatation. SPLEEN: No acute abnormality. PANCREAS: No acute abnormality. ADRENAL GLANDS: No acute abnormality. KIDNEYS, URETERS AND BLADDER: There is mild diffuse renal atrophy bilaterally. There is a single punctate calculus in the right kidney. No stones in the ureters. No hydronephrosis. No perinephric or periureteral stranding. The bladder is decompressed by Foley catheter. GI AND BOWEL: Stomach demonstrates no acute abnormality. There is sigmoid and descending colon diverticulosis. The appendix is within normal limits. There is a large amount of stool in the rectum. There is no bowel obstruction. PERITONEUM AND RETROPERITONEUM: No ascites. No free air. VASCULATURE: Aorta is normal in caliber. There are atherosclerotic calcifications of the aorta. LYMPH NODES: No lymphadenopathy. REPRODUCTIVE ORGANS: No acute abnormality. BONES AND SOFT TISSUES: No acute osseous abnormality. No focal soft tissue abnormality. IMPRESSION:  1. Mild diffuse renal atrophy bilaterally with a single punctate calculus in the right kidney. 2. Sigmoid and descending colon diverticulosis without evidence of diverticulitis. 3. Large amount of stool in the rectum. Electronically signed by: Greig Pique MD 04/28/2024 07:10 PM EST RP Workstation: [id number]   DG Chest 1 View Result Date: 04/28/2024 CLINICAL DATA:  Weakness. EXAM: CHEST  1 VIEW COMPARISON:  Chest radiograph dated 07/16/2018. FINDINGS: Faint interstitial densities may be chronic or atypical infiltrate. No consolidative changes. There is no pleural effusion or pneumothorax. Mild cardiomegaly. No acute osseous pathology. IMPRESSION: Chronic interstitial coarsening versus atypical infiltrate. No consolidative changes. Electronically Signed   By: Vanetta Chou M.D.   On: 04/28/2024 18:18    Micro Results     Recent Results (from the past 240 hours)  Resp panel by RT-PCR (RSV, Flu A&B, Covid) Anterior Nasal Swab     Status: None   Collection Time: 04/28/24  5:14 PM  Specimen: Anterior Nasal Swab  Result Value Ref Range Status   SARS Coronavirus 2 by RT PCR NEGATIVE NEGATIVE Final   Influenza A by PCR NEGATIVE NEGATIVE Final   Influenza B by PCR NEGATIVE NEGATIVE Final    Comment: (NOTE) The Xpert Xpress SARS-CoV-2/FLU/RSV plus assay is intended as an aid in the diagnosis of influenza from Nasopharyngeal swab specimens and should not be used as a sole basis for treatment. Nasal washings and aspirates are unacceptable for Xpert Xpress SARS-CoV-2/FLU/RSV testing.  Fact Sheet for Patients: bloggercourse.com  Fact Sheet for Healthcare Providers: seriousbroker.it  This test is not yet approved or cleared by the [place]  FDA and has been authorized for detection and/or diagnosis of SARS-CoV-2 by FDA under an Emergency Use Authorization (EUA). This EUA will remain in effect (meaning this test can be used) for the duration  of the COVID-19 declaration under Section 564(b)(1) of the Act, 21 U.S.C. section 360bbb-3(b)(1), unless the authorization is terminated or revoked.     Resp Syncytial Virus by PCR NEGATIVE NEGATIVE Final    Comment: (NOTE) Fact Sheet for Patients: bloggercourse.com  Fact Sheet for Healthcare Providers: seriousbroker.it  This test is not yet approved or cleared by the [place]  FDA and has been authorized for detection and/or diagnosis of SARS-CoV-2 by FDA under an Emergency Use Authorization (EUA). This EUA will remain in effect (meaning this test can be used) for the duration of the COVID-19 declaration under Section 564(b)(1) of the Act, 21 U.S.C. section 360bbb-3(b)(1), unless the authorization is terminated or revoked.  Performed at [facility], [address], [place], [place] [address]     Today   Subjective    Jeremy Grant today has no headache,no chest abdominal pain,no new weakness tingling or numbness, feels much better wants to go home today.     Objective   Blood pressure 97/75, pulse 94, temperature (!) 97.4 F (36.3 C), temperature source Oral, resp. rate (!) 30, height 6' 1 (1.854 m), weight 59.4 kg, SpO2 100%.   Intake/Output Summary (Last 24 hours) at 05/04/2024 0917 Last data filed at 05/03/2024 1208 Gross per 24 hour  Intake --  Output 600 ml  Net -600 ml    Exam  Awake Alert, No new F.N deficits,    [place].AT,PERRAL Supple Neck,   Symmetrical Chest wall movement, Good air movement bilaterally, CTAB RRR,No Gallops,   +ve B.Sounds, Abd Soft, Non tender,  No Cyanosis, Clubbing or edema    Data Review   Recent Labs  Lab 04/28/24 2156 04/29/24 0234 04/30/24 0111 05/02/24 0439 05/04/24 0757  WBC 5.6 7.1 6.4 7.0 7.4  HGB 10.4* 9.5* 9.6* 11.4* 10.3*  HCT 28.0* 26.1* 25.8* 31.1* 28.6*  PLT 75* 73* 70* 80* 101*  MCV 81.4 82.9 81.4 82.7 84.6  MCH 30.2 30.2 30.3 30.3 30.5  MCHC  37.1* 36.4* 37.2* 36.7* 36.0  RDW 13.1 13.1 13.2 13.3 12.8  LYMPHSABS  --   --   --   --  0.3*  MONOABS  --   --   --   --  0.7  EOSABS  --   --   --   --  0.1  BASOSABS  --   --   --   --  0.0    Recent Labs  Lab 04/28/24 1538 04/28/24 2156 04/29/24 0234 04/30/24 0111 05/02/24 0439 05/04/24 0757  NA 129*  --  131* 135 135 132*  K 3.6  --  3.8 3.0* 3.6 3.7  CL 87*  --  91* 92* 90* 89*  CO2 10*  --  7* 14* 19* 23  ANIONGAP 32*  --  33* 29* 26* 19*  GLUCOSE 161*  --  97 90 143* 138*  BUN 217*  --  217* 110* 80* 93*  CREATININE 10.60* 10.40* 10.40* 6.13* 6.02* 7.44*  AST 20  --   --   --   --   --   ALT 20  --   --   --   --   --   ALKPHOS 77  --   --   --   --   --   BILITOT 0.6  --   --   --   --   --   ALBUMIN 4.4  --  4.0 3.9 4.3 4.0  INR 1.1  --   --  1.0  --   --   TSH  --  1.990 1.780  --   --   --   PHOS  --   --  14.5* 9.3* 8.3* 5.8*  CALCIUM  6.5*  --  6.4* 7.3* 8.8* 8.0*    Total Time in preparing paper work, data evaluation and todays exam - 35 minutes  Signature  -    Lavada Stank M.D on 05/04/2024 at 9:17 AM   -  To page go to [email or web address]      "

## 2024-05-04 NOTE — Progress Notes (Signed)
 OT Cancellation Note  Patient Details Name: Jeremy Grant MRN: [id number] DOB: [birth year]   Cancelled Treatment:    Reason Eval/Treat Not Completed: Patient at procedure or test/ unavailable  [name] 05/04/2024, 11:27 AM 05/04/2024  RP, OTR/L  Acute Rehabilitation Services  Office:  [phone]

## 2024-05-15 ENCOUNTER — Telehealth: Payer: Self-pay | Admitting: Radiology

## 2024-05-15 NOTE — Telephone Encounter (Signed)
 Noted, thank you.

## 2024-05-15 NOTE — Telephone Encounter (Signed)
 Nena from [facility] called the triage line, asking for wound care orders, I advised that it looks like in his note that he wants Vashe and a dressing put on it daily.   Please call her at [phone] if there is something else that should be done

## 2024-05-20 ENCOUNTER — Telehealth: Payer: Self-pay | Admitting: Orthopedic Surgery

## 2024-05-20 NOTE — Telephone Encounter (Signed)
 I called and lm on vm to advise verbal ok for orders below. To call with questions.

## 2024-05-20 NOTE — Telephone Encounter (Signed)
 Mark with [facility] called. Verbal orders for PT 2X WK 3WKS 1/6 start date. 1x 2wk after. Cb# [phone]

## 2024-05-25 ENCOUNTER — Ambulatory Visit: Admitting: Orthopedic Surgery

## 2024-05-25 DIAGNOSIS — I87332 Chronic venous hypertension (idiopathic) with ulcer and inflammation of left lower extremity: Secondary | ICD-10-CM

## 2024-05-25 DIAGNOSIS — T148XXD Other injury of unspecified body region, subsequent encounter: Secondary | ICD-10-CM

## 2024-05-25 DIAGNOSIS — L97922 Non-pressure chronic ulcer of unspecified part of left lower leg with fat layer exposed: Secondary | ICD-10-CM

## 2024-05-25 DIAGNOSIS — E1122 Type 2 diabetes mellitus with diabetic chronic kidney disease: Secondary | ICD-10-CM | POA: Diagnosis not present

## 2024-05-25 DIAGNOSIS — S8012XD Contusion of left lower leg, subsequent encounter: Secondary | ICD-10-CM

## 2024-05-25 DIAGNOSIS — N186 End stage renal disease: Secondary | ICD-10-CM | POA: Diagnosis not present

## 2024-05-25 DIAGNOSIS — Z992 Dependence on renal dialysis: Secondary | ICD-10-CM | POA: Diagnosis not present

## 2024-05-25 DIAGNOSIS — I120 Hypertensive chronic kidney disease with stage 5 chronic kidney disease or end stage renal disease: Secondary | ICD-10-CM | POA: Diagnosis not present

## 2024-05-26 ENCOUNTER — Encounter: Payer: Self-pay | Admitting: Orthopedic Surgery

## 2024-05-26 NOTE — Progress Notes (Signed)
 "  Office Visit Note   Patient: Jeremy Grant           Date of Birth: [birth year]           MRN: [id number] Visit Date: 05/25/2024              Requested by: Burney Darice CROME, MD [address] [place],  [place] [address] PCP: Burney Darice CROME, MD  Chief Complaint  Patient presents with   Left Leg - Follow-up      HPI: Discussed the use of AI scribe software for clinical note transcription with the patient, who gave verbal consent to proceed.  History of Present Illness Jeremy Grant is a 64 year old male with end-stage renal disease on dialysis who presents for follow-up of a chronic left lower leg ulcer.  He has a chronic ulcer over the anterior lateral aspect of the left lower leg with muscle involvement. The wound had previously ceased healing, which he attributes to worsening renal function prior to initiation of dialysis. Since starting dialysis, he reports improved overall health and notes that the wound has resumed healing.  He is actively participating in physical therapy and demonstrates increased activity levels, including ambulating outdoors with a rollator. He reports significant improvement in his general condition compared to his previous visit.     Assessment & Plan: Visit Diagnoses:  1. Delayed wound healing   2. Traumatic hematoma of left lower leg, subsequent encounter   3. Chronic venous hypertension (idiopathic) with ulcer and inflammation of left lower extremity (HCC)   4. 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 Dialysis and increased activity contributed to systemic health and wound healing. - Continued Vashe dressing changes with 4x4 gauze. - Scheduled wound re-evaluation in four weeks. - Planned initiation of a special  probiotic both orally and topically at the next visit, pending availability.      Follow-Up Instructions: Return in about 4 weeks (around 06/22/2024).   Ortho Exam  Patient is alert, oriented, no adenopathy, well-dressed, normal affect, normal respiratory effort. Physical Exam SKIN: Wound on the anterior lateral aspect of the left leg with 100% flat, healthy granulation tissue, measuring 5x11 cm.      Imaging: No results found.   Labs: Lab Results  Component Value Date   HGBA1C 5.8 (A) 04/23/2024   HGBA1C 6.1 (H) 05/04/2023   HGBA1C 5.4 01/12/2019     Lab Results  Component Value Date   ALBUMIN 4.0 05/04/2024   ALBUMIN 4.3 05/02/2024   ALBUMIN 3.9 04/30/2024    No results found for: MG No results found for: VD25OH  No results found for: PREALBUMIN    Latest Ref Rng & Units 05/04/2024    7:57 AM 05/02/2024    4:39 AM 04/30/2024    1:11 AM  CBC EXTENDED  WBC 4.0 - 10.5 K/uL 7.4  7.0  6.4   RBC 4.22 - 5.81 MIL/uL 3.38  3.76  3.17   Hemoglobin 13.0 - 17.0 g/dL 89.6  88.5  9.6   HCT 60.9 - 52.0 % 28.6  31.1  25.8   Platelets 150 - 400 K/uL 101  80  70   NEUT# 1.7 - 7.7 K/uL 6.3     Lymph# 0.7 - 4.0 K/uL 0.3  There is no height or weight on file to calculate BMI.  Orders:  No orders of the defined types were placed in this encounter.  No orders of the defined types were placed in this encounter.    Procedures: No procedures performed  Clinical Data: No additional findings.  ROS:  All other systems negative, except as noted in the HPI. Review of Systems  Objective: Vital Signs: There were no vitals taken for this visit.  Specialty Comments:  No specialty comments available.  PMFS History: Patient Active Problem List   Diagnosis Date Noted   Protein-calorie malnutrition, severe 05/04/2024   Leg ulcer, left, limited to breakdown of skin (HCC) 05/01/2024   ESRD (end stage renal disease) (HCC) 04/28/2024   Hematoma 05/06/2023    Traumatic hematoma of left lower leg 05/04/2023   CKD (chronic kidney disease) stage 4, GFR 15-29 ml/min (HCC) 05/04/2023   Essential hypertension 05/25/2019   Spasm 03/23/2019   Steroid-induced hyperglycemia    Acute on chronic anemia    Seizures (HCC)    Spastic hemiparesis (HCC)    Vasculitis    Hypertension    Thrombocytopenia    Elevated serum protein level    Multiple myeloma (HCC)    Pancytopenia (HCC)    Normocytic normochromic anemia 07/16/2018   NICM (nonischemic cardiomyopathy) (HCC) 11/12/2013   History of ETOH abuse 11/12/2013   Chronic combined systolic and diastolic CHF (congestive heart failure) (HCC) 11/12/2013   Congestive dilated cardiomyopathy (HCC) 11/04/2013   Past Medical History:  Diagnosis Date   Arthritis    CHF (congestive heart failure) (HCC)    Combined systolic and diastolic cardiac dysfunction    Echo 11/03/2013 EF 20%, grade 3 diastolic dysfunction   Hypertension    NICM (nonischemic cardiomyopathy) (HCC)    a. L/RHC (11/05/13): RA: 3, RV 52/5, PA 49/19 (31), PCWP 10, AO 166/93, PA 67%, Fick CO/CI: 5.71/2.97, Lmain: normal, LAD: large, without signficant dz, first diagonal has 20% dz at ostium, LCx: normal, RCA: 30% stenosis at the bifurcation of PDA and PLOM   Stroke [facility])     Family History  Problem Relation Age of Onset   Heart attack Father 28   Heart disease Father    Arrhythmia Father    Hypertension Brother    Hypertension Brother     Past Surgical History:  Procedure Laterality Date   BIOPSY  01/31/2019   Procedure: BIOPSY;  Surgeon: Elicia Claw, MD;  Location: [facility] ENDOSCOPY;  Service: Gastroenterology;;   CATARACT EXTRACTION Bilateral 05/2021   ESOPHAGOGASTRODUODENOSCOPY N/A 01/31/2019   Procedure: ESOPHAGOGASTRODUODENOSCOPY (EGD);  Surgeon: Elicia Claw, MD;  Location[facility] ENDOSCOPY;  Service: Gastroenterology;  Laterality: N/A;   I & D EXTREMITY Left 05/05/2023   Procedure: IRRIGATION AND DEBRIDEMENT LEFT CALF;  Surgeon:  Harden Jerona GAILS, MD;  Location[facility] OR;  Service: Orthopedics;  Laterality: Left;   I & D EXTREMITY Left 05/09/2023   Procedure: IRRIGATION AND DEBRIDEMENT OF LEG;  Surgeon: Georgina Ozell LABOR, MD;  Location: [facility] OR;  Service: Orthopedics;  Laterality: Left;   IR FLUORO GUIDE CV LINE RIGHT  07/25/2018   IR TUNNELED CENTRAL VENOUS CATH PLC W IMG  04/29/2024   IR US  GUIDE VASC ACCESS RIGHT  07/25/2018   LEFT AND RIGHT HEART CATHETERIZATION WITH CORONARY ANGIOGRAM N/A 11/05/2013   Procedure: LEFT AND RIGHT HEART CATHETERIZATION WITH CORONARY ANGIOGRAM;  Surgeon: Peter M Jordan, MD;  Location: [facility] CATH LAB;  Service: Cardiovascular;  Laterality: N/A;   RIGHT HEART CATHETERIZATION N/A 01/14/2014  Procedure: RIGHT HEART CATH;  Surgeon: Ezra GORMAN Shuck, MD;  Location: [facility] CATH LAB;  Service: Cardiovascular;  Laterality: N/A;   Social History   Occupational History   Occupation: cabin crew  Tobacco Use   Smoking status: Never   Smokeless tobacco: Never  Substance and Sexual Activity   Alcohol use: Yes    Alcohol/week: 3.0 standard drinks of alcohol    Types: 3 Glasses of wine per week   Drug use: No   Sexual activity: Not Currently    Birth control/protection: None         "

## 2024-06-16 ENCOUNTER — Telehealth: Payer: Self-pay | Admitting: *Deleted

## 2024-06-16 NOTE — Telephone Encounter (Signed)
 Notified [facility].

## 2024-06-25 ENCOUNTER — Ambulatory Visit: Admitting: Orthopedic Surgery
